# Patient Record
Sex: Male | Born: 1940
Health system: Southern US, Community
[De-identification: ages and names within clinical notes are randomized; demographics above are authoritative.]

## PROBLEM LIST (undated history)

## (undated) DIAGNOSIS — I5032 Chronic diastolic (congestive) heart failure: Secondary | ICD-10-CM

## (undated) DIAGNOSIS — F32A Depression, unspecified: Secondary | ICD-10-CM

## (undated) DIAGNOSIS — E785 Hyperlipidemia, unspecified: Secondary | ICD-10-CM

## (undated) DIAGNOSIS — R001 Bradycardia, unspecified: Secondary | ICD-10-CM

## (undated) DIAGNOSIS — K5790 Diverticulosis of intestine, part unspecified, without perforation or abscess without bleeding: Secondary | ICD-10-CM

## (undated) DIAGNOSIS — Z950 Presence of cardiac pacemaker: Secondary | ICD-10-CM

## (undated) DIAGNOSIS — M1990 Unspecified osteoarthritis, unspecified site: Secondary | ICD-10-CM

## (undated) DIAGNOSIS — D126 Benign neoplasm of colon, unspecified: Secondary | ICD-10-CM

## (undated) DIAGNOSIS — K2970 Gastritis, unspecified, without bleeding: Secondary | ICD-10-CM

## (undated) DIAGNOSIS — I2510 Atherosclerotic heart disease of native coronary artery without angina pectoris: Secondary | ICD-10-CM

## (undated) DIAGNOSIS — I491 Atrial premature depolarization: Secondary | ICD-10-CM

## (undated) DIAGNOSIS — Z789 Other specified health status: Secondary | ICD-10-CM

## (undated) DIAGNOSIS — G4733 Obstructive sleep apnea (adult) (pediatric): Secondary | ICD-10-CM

## (undated) DIAGNOSIS — E039 Hypothyroidism, unspecified: Secondary | ICD-10-CM

## (undated) DIAGNOSIS — I10 Essential (primary) hypertension: Secondary | ICD-10-CM

## (undated) DIAGNOSIS — R7303 Prediabetes: Secondary | ICD-10-CM

## (undated) DIAGNOSIS — I493 Ventricular premature depolarization: Secondary | ICD-10-CM

## (undated) DIAGNOSIS — Z8719 Personal history of other diseases of the digestive system: Secondary | ICD-10-CM

## (undated) DIAGNOSIS — Z9289 Personal history of other medical treatment: Secondary | ICD-10-CM

## (undated) DIAGNOSIS — Q2730 Arteriovenous malformation, site unspecified: Secondary | ICD-10-CM

## (undated) DIAGNOSIS — K219 Gastro-esophageal reflux disease without esophagitis: Secondary | ICD-10-CM

## (undated) DIAGNOSIS — F329 Major depressive disorder, single episode, unspecified: Secondary | ICD-10-CM

## (undated) DIAGNOSIS — I4892 Unspecified atrial flutter: Secondary | ICD-10-CM

## (undated) DIAGNOSIS — I219 Acute myocardial infarction, unspecified: Secondary | ICD-10-CM

## (undated) HISTORY — DX: Atherosclerotic heart disease of native coronary artery without angina pectoris: I25.10

## (undated) HISTORY — DX: Hypothyroidism, unspecified: E03.9

## (undated) HISTORY — DX: Obstructive sleep apnea (adult) (pediatric): G47.33

## (undated) HISTORY — DX: Diverticulosis of intestine, part unspecified, without perforation or abscess without bleeding: K57.90

## (undated) HISTORY — PX: ANKLE SURGERY: SHX546

## (undated) HISTORY — PX: SKIN GRAFT: SHX250

## (undated) HISTORY — DX: Essential (primary) hypertension: I10

## (undated) HISTORY — DX: Benign neoplasm of colon, unspecified: D12.6

## (undated) HISTORY — DX: Hyperlipidemia, unspecified: E78.5

## (undated) HISTORY — DX: Atrial premature depolarization: I49.1

## (undated) HISTORY — DX: Gastritis, unspecified, without bleeding: K29.70

## (undated) HISTORY — PX: TONSILLECTOMY: SUR1361

## (undated) HISTORY — DX: Gastro-esophageal reflux disease without esophagitis: K21.9

## (undated) HISTORY — PX: HERNIA REPAIR: SHX51

## (undated) HISTORY — PX: APPENDECTOMY: SHX54

## (undated) HISTORY — DX: Chronic diastolic (congestive) heart failure: I50.32

## (undated) HISTORY — DX: Acute myocardial infarction, unspecified: I21.9

## (undated) HISTORY — DX: Ventricular premature depolarization: I49.3

## (undated) HISTORY — DX: Arteriovenous malformation, site unspecified: Q27.30

---

## 1998-12-14 ENCOUNTER — Emergency Department (HOSPITAL_COMMUNITY): Admission: EM | Admit: 1998-12-14 | Discharge: 1998-12-14 | Payer: Self-pay | Admitting: Emergency Medicine

## 1999-05-26 ENCOUNTER — Emergency Department (HOSPITAL_COMMUNITY): Admission: EM | Admit: 1999-05-26 | Discharge: 1999-05-26 | Payer: Self-pay

## 1999-05-26 ENCOUNTER — Encounter: Payer: Self-pay | Admitting: Emergency Medicine

## 1999-11-19 ENCOUNTER — Encounter: Payer: Self-pay | Admitting: Emergency Medicine

## 1999-11-19 ENCOUNTER — Emergency Department (HOSPITAL_COMMUNITY): Admission: EM | Admit: 1999-11-19 | Discharge: 1999-11-19 | Payer: Self-pay | Admitting: Emergency Medicine

## 2000-03-12 ENCOUNTER — Ambulatory Visit (HOSPITAL_COMMUNITY): Admission: RE | Admit: 2000-03-12 | Discharge: 2000-03-12 | Payer: Self-pay | Admitting: Family Medicine

## 2000-03-12 ENCOUNTER — Encounter: Payer: Self-pay | Admitting: Family Medicine

## 2000-10-14 ENCOUNTER — Encounter: Payer: Self-pay | Admitting: Family Medicine

## 2000-10-14 ENCOUNTER — Ambulatory Visit (HOSPITAL_COMMUNITY): Admission: RE | Admit: 2000-10-14 | Discharge: 2000-10-14 | Payer: Self-pay | Admitting: Family Medicine

## 2001-04-02 ENCOUNTER — Encounter: Payer: Self-pay | Admitting: Emergency Medicine

## 2001-04-02 ENCOUNTER — Emergency Department (HOSPITAL_COMMUNITY): Admission: EM | Admit: 2001-04-02 | Discharge: 2001-04-02 | Payer: Self-pay | Admitting: Emergency Medicine

## 2002-02-18 ENCOUNTER — Encounter: Payer: Self-pay | Admitting: Family Medicine

## 2002-02-18 ENCOUNTER — Ambulatory Visit (HOSPITAL_COMMUNITY): Admission: RE | Admit: 2002-02-18 | Discharge: 2002-02-18 | Payer: Self-pay | Admitting: Family Medicine

## 2003-11-15 ENCOUNTER — Inpatient Hospital Stay (HOSPITAL_COMMUNITY): Admission: AD | Admit: 2003-11-15 | Discharge: 2003-11-16 | Payer: Self-pay | Admitting: *Deleted

## 2004-09-22 ENCOUNTER — Ambulatory Visit: Payer: Self-pay | Admitting: Family Medicine

## 2005-01-02 ENCOUNTER — Ambulatory Visit: Payer: Self-pay | Admitting: Family Medicine

## 2005-01-02 ENCOUNTER — Encounter: Admission: RE | Admit: 2005-01-02 | Discharge: 2005-01-02 | Payer: Self-pay | Admitting: Family Medicine

## 2005-04-12 ENCOUNTER — Ambulatory Visit: Payer: Self-pay | Admitting: Family Medicine

## 2005-04-14 ENCOUNTER — Encounter: Admission: RE | Admit: 2005-04-14 | Discharge: 2005-04-14 | Payer: Self-pay | Admitting: Family Medicine

## 2005-07-10 ENCOUNTER — Ambulatory Visit: Payer: Self-pay | Admitting: Family Medicine

## 2005-07-10 ENCOUNTER — Ambulatory Visit: Payer: Self-pay | Admitting: Cardiovascular Disease

## 2005-07-11 ENCOUNTER — Ambulatory Visit: Payer: Self-pay | Admitting: Cardiovascular Disease

## 2005-07-11 ENCOUNTER — Ambulatory Visit (HOSPITAL_COMMUNITY): Admission: RE | Admit: 2005-07-11 | Discharge: 2005-07-11 | Payer: Self-pay | Admitting: Cardiovascular Disease

## 2005-07-12 ENCOUNTER — Observation Stay (HOSPITAL_COMMUNITY): Admission: EM | Admit: 2005-07-12 | Discharge: 2005-07-13 | Payer: Self-pay | Admitting: Emergency Medicine

## 2005-07-23 DIAGNOSIS — D126 Benign neoplasm of colon, unspecified: Secondary | ICD-10-CM

## 2005-07-23 HISTORY — DX: Benign neoplasm of colon, unspecified: D12.6

## 2005-07-25 ENCOUNTER — Ambulatory Visit: Payer: Self-pay | Admitting: Cardiology

## 2005-07-26 ENCOUNTER — Ambulatory Visit: Payer: Self-pay | Admitting: Internal Medicine

## 2005-08-01 ENCOUNTER — Ambulatory Visit: Payer: Self-pay | Admitting: *Deleted

## 2005-08-01 ENCOUNTER — Ambulatory Visit: Payer: Self-pay | Admitting: Internal Medicine

## 2005-08-01 ENCOUNTER — Ambulatory Visit: Payer: Self-pay | Admitting: Family Medicine

## 2005-08-01 ENCOUNTER — Encounter: Admission: RE | Admit: 2005-08-01 | Discharge: 2005-08-01 | Payer: Self-pay | Admitting: Family Medicine

## 2005-08-01 ENCOUNTER — Ambulatory Visit: Payer: Self-pay

## 2005-08-02 ENCOUNTER — Encounter: Admission: RE | Admit: 2005-08-02 | Discharge: 2005-08-02 | Payer: Self-pay | Admitting: Family Medicine

## 2005-08-06 ENCOUNTER — Ambulatory Visit: Payer: Self-pay | Admitting: Family Medicine

## 2005-08-09 ENCOUNTER — Ambulatory Visit: Payer: Self-pay | Admitting: Gastroenterology

## 2005-08-23 ENCOUNTER — Encounter (INDEPENDENT_AMBULATORY_CARE_PROVIDER_SITE_OTHER): Payer: Self-pay | Admitting: *Deleted

## 2005-08-23 ENCOUNTER — Ambulatory Visit: Payer: Self-pay | Admitting: Gastroenterology

## 2005-08-23 HISTORY — PX: ESOPHAGOGASTRODUODENOSCOPY: SHX1529

## 2005-08-23 HISTORY — PX: COLONOSCOPY: SHX174

## 2005-08-29 ENCOUNTER — Ambulatory Visit: Payer: Self-pay | Admitting: Gastroenterology

## 2005-08-30 ENCOUNTER — Encounter (INDEPENDENT_AMBULATORY_CARE_PROVIDER_SITE_OTHER): Payer: Self-pay | Admitting: *Deleted

## 2005-08-30 ENCOUNTER — Ambulatory Visit: Payer: Self-pay | Admitting: Gastroenterology

## 2005-09-06 ENCOUNTER — Ambulatory Visit: Payer: Self-pay | Admitting: Gastroenterology

## 2005-09-11 ENCOUNTER — Ambulatory Visit: Payer: Self-pay | Admitting: Family Medicine

## 2005-09-12 ENCOUNTER — Ambulatory Visit: Payer: Self-pay | Admitting: Gastroenterology

## 2005-09-17 ENCOUNTER — Ambulatory Visit (HOSPITAL_COMMUNITY): Admission: RE | Admit: 2005-09-17 | Discharge: 2005-09-17 | Payer: Self-pay | Admitting: Gastroenterology

## 2005-09-17 ENCOUNTER — Ambulatory Visit: Payer: Self-pay | Admitting: Gastroenterology

## 2005-09-18 ENCOUNTER — Ambulatory Visit: Payer: Self-pay | Admitting: Family Medicine

## 2005-09-25 ENCOUNTER — Ambulatory Visit: Payer: Self-pay | Admitting: Family Medicine

## 2005-10-01 ENCOUNTER — Ambulatory Visit: Payer: Self-pay | Admitting: Gastroenterology

## 2005-10-02 ENCOUNTER — Ambulatory Visit: Payer: Self-pay | Admitting: Family Medicine

## 2005-10-08 ENCOUNTER — Ambulatory Visit: Payer: Self-pay | Admitting: Gastroenterology

## 2005-10-09 ENCOUNTER — Ambulatory Visit (HOSPITAL_COMMUNITY): Admission: RE | Admit: 2005-10-09 | Discharge: 2005-10-09 | Payer: Self-pay | Admitting: Gastroenterology

## 2005-10-09 ENCOUNTER — Encounter: Payer: Self-pay | Admitting: Gastroenterology

## 2005-10-09 ENCOUNTER — Ambulatory Visit: Payer: Self-pay | Admitting: Family Medicine

## 2005-10-12 ENCOUNTER — Ambulatory Visit: Payer: Self-pay | Admitting: Gastroenterology

## 2005-10-12 ENCOUNTER — Ambulatory Visit: Payer: Self-pay | Admitting: Family Medicine

## 2005-10-29 ENCOUNTER — Ambulatory Visit: Payer: Self-pay | Admitting: Family Medicine

## 2006-05-02 ENCOUNTER — Ambulatory Visit: Payer: Self-pay | Admitting: Internal Medicine

## 2006-05-06 ENCOUNTER — Ambulatory Visit: Payer: Self-pay | Admitting: Family Medicine

## 2006-05-06 LAB — CONVERTED CEMR LAB
Basophils Relative: 0.7 % (ref 0.0–1.0)
Eosinophil percent: 4.1 % (ref 0.0–5.0)
Hemoglobin: 13.3 g/dL (ref 13.0–17.0)
Monocytes Absolute: 1.1 10*3/uL — ABNORMAL HIGH (ref 0.2–0.7)
Monocytes Relative: 12.4 % — ABNORMAL HIGH (ref 3.0–11.0)
Platelets: 322 10*3/uL (ref 150–400)
RDW: 14.1 % (ref 11.5–14.6)
T3, Free: 3.3 pg/mL (ref 2.3–4.2)
TSH: 3.27 microintl units/mL (ref 0.35–5.50)
WBC: 9.2 10*3/uL (ref 4.5–10.5)

## 2006-07-22 ENCOUNTER — Ambulatory Visit: Payer: Self-pay | Admitting: Family Medicine

## 2006-10-14 ENCOUNTER — Ambulatory Visit: Payer: Self-pay | Admitting: Family Medicine

## 2007-01-13 DIAGNOSIS — D649 Anemia, unspecified: Secondary | ICD-10-CM | POA: Insufficient documentation

## 2007-01-13 DIAGNOSIS — K219 Gastro-esophageal reflux disease without esophagitis: Secondary | ICD-10-CM | POA: Insufficient documentation

## 2007-03-26 ENCOUNTER — Ambulatory Visit: Payer: Self-pay | Admitting: Family Medicine

## 2007-03-26 DIAGNOSIS — K279 Peptic ulcer, site unspecified, unspecified as acute or chronic, without hemorrhage or perforation: Secondary | ICD-10-CM | POA: Insufficient documentation

## 2007-03-28 ENCOUNTER — Encounter: Payer: Self-pay | Admitting: Family Medicine

## 2007-03-28 LAB — CONVERTED CEMR LAB
Alkaline Phosphatase: 52 units/L (ref 39–117)
Basophils Relative: 0.4 % (ref 0.0–1.0)
Bilirubin, Direct: 0.1 mg/dL (ref 0.0–0.3)
CO2: 27 meq/L (ref 19–32)
Creatinine, Ser: 1 mg/dL (ref 0.4–1.5)
Glucose, Bld: 98 mg/dL (ref 70–99)
HCT: 37.2 % — ABNORMAL LOW (ref 39.0–52.0)
Hemoglobin: 12.2 g/dL — ABNORMAL LOW (ref 13.0–17.0)
Lymphocytes Relative: 28.1 % (ref 12.0–46.0)
Monocytes Absolute: 1.5 10*3/uL — ABNORMAL HIGH (ref 0.2–0.7)
Neutrophils Relative %: 52.1 % (ref 43.0–77.0)
Potassium: 3.9 meq/L (ref 3.5–5.1)
RDW: 15.2 % — ABNORMAL HIGH (ref 11.5–14.6)
Sodium: 140 meq/L (ref 135–145)
TSH: 15.83 microintl units/mL — ABNORMAL HIGH (ref 0.35–5.50)
Total Bilirubin: 1.2 mg/dL (ref 0.3–1.2)
Total Protein: 7 g/dL (ref 6.0–8.3)

## 2007-05-27 ENCOUNTER — Ambulatory Visit: Payer: Self-pay | Admitting: Family Medicine

## 2007-05-28 ENCOUNTER — Ambulatory Visit: Payer: Self-pay | Admitting: Family Medicine

## 2007-05-28 LAB — CONVERTED CEMR LAB: TSH: 4.35 microintl units/mL (ref 0.35–5.50)

## 2007-06-25 ENCOUNTER — Telehealth: Payer: Self-pay | Admitting: Family Medicine

## 2007-08-07 ENCOUNTER — Encounter: Payer: Self-pay | Admitting: Family Medicine

## 2007-08-26 ENCOUNTER — Encounter: Payer: Self-pay | Admitting: Family Medicine

## 2007-09-02 ENCOUNTER — Ambulatory Visit (HOSPITAL_BASED_OUTPATIENT_CLINIC_OR_DEPARTMENT_OTHER): Admission: RE | Admit: 2007-09-02 | Discharge: 2007-09-02 | Payer: Self-pay | Admitting: Orthopedic Surgery

## 2007-09-02 ENCOUNTER — Encounter: Payer: Self-pay | Admitting: Family Medicine

## 2007-09-03 ENCOUNTER — Ambulatory Visit: Payer: Self-pay | Admitting: Internal Medicine

## 2007-09-03 ENCOUNTER — Ambulatory Visit: Payer: Self-pay | Admitting: Cardiology

## 2007-09-03 ENCOUNTER — Inpatient Hospital Stay (HOSPITAL_COMMUNITY): Admission: EM | Admit: 2007-09-03 | Discharge: 2007-09-05 | Payer: Self-pay | Admitting: Emergency Medicine

## 2007-09-05 ENCOUNTER — Encounter: Payer: Self-pay | Admitting: Family Medicine

## 2007-09-10 ENCOUNTER — Encounter: Payer: Self-pay | Admitting: Family Medicine

## 2007-09-11 ENCOUNTER — Ambulatory Visit: Payer: Self-pay | Admitting: Family Medicine

## 2007-09-11 DIAGNOSIS — I252 Old myocardial infarction: Secondary | ICD-10-CM | POA: Insufficient documentation

## 2007-09-15 ENCOUNTER — Encounter: Payer: Self-pay | Admitting: Family Medicine

## 2007-09-15 LAB — CONVERTED CEMR LAB
Basophils Relative: 0.7 % (ref 0.0–1.0)
Lymphocytes Relative: 21.8 % (ref 12.0–46.0)
Monocytes Relative: 10.7 % (ref 3.0–11.0)
Neutro Abs: 10.1 10*3/uL — ABNORMAL HIGH (ref 1.4–7.7)
Platelets: 379 10*3/uL (ref 150–400)

## 2007-09-18 ENCOUNTER — Ambulatory Visit: Payer: Self-pay | Admitting: Internal Medicine

## 2007-09-24 ENCOUNTER — Telehealth (INDEPENDENT_AMBULATORY_CARE_PROVIDER_SITE_OTHER): Payer: Self-pay | Admitting: *Deleted

## 2007-09-30 ENCOUNTER — Ambulatory Visit: Payer: Self-pay | Admitting: Cardiology

## 2007-10-04 ENCOUNTER — Ambulatory Visit: Payer: Self-pay | Admitting: Family Medicine

## 2007-10-04 DIAGNOSIS — J209 Acute bronchitis, unspecified: Secondary | ICD-10-CM | POA: Insufficient documentation

## 2007-10-08 ENCOUNTER — Encounter: Payer: Self-pay | Admitting: Family Medicine

## 2007-10-15 ENCOUNTER — Ambulatory Visit: Payer: Self-pay | Admitting: Cardiology

## 2007-11-06 ENCOUNTER — Encounter: Payer: Self-pay | Admitting: Family Medicine

## 2007-12-25 ENCOUNTER — Ambulatory Visit: Payer: Self-pay | Admitting: Cardiology

## 2008-01-19 ENCOUNTER — Telehealth: Payer: Self-pay | Admitting: Family Medicine

## 2008-03-22 ENCOUNTER — Ambulatory Visit: Payer: Self-pay | Admitting: Cardiology

## 2008-03-25 ENCOUNTER — Ambulatory Visit: Payer: Self-pay | Admitting: Cardiology

## 2008-03-25 LAB — CONVERTED CEMR LAB
Basophils Absolute: 0.1 10*3/uL (ref 0.0–0.1)
Bilirubin, Direct: 0.1 mg/dL (ref 0.0–0.3)
Calcium: 9.3 mg/dL (ref 8.4–10.5)
Cholesterol: 133 mg/dL (ref 0–200)
Eosinophils Absolute: 0.4 10*3/uL (ref 0.0–0.7)
GFR calc Af Amer: 86 mL/min
GFR calc non Af Amer: 71 mL/min
HCT: 47.3 % (ref 39.0–52.0)
Hemoglobin: 16.5 g/dL (ref 13.0–17.0)
LDL Cholesterol: 71 mg/dL (ref 0–99)
Lymphocytes Relative: 30.9 % (ref 12.0–46.0)
MCHC: 34.8 g/dL (ref 30.0–36.0)
Monocytes Absolute: 0.9 10*3/uL (ref 0.1–1.0)
Neutro Abs: 4.4 10*3/uL (ref 1.4–7.7)
RDW: 13.3 % (ref 11.5–14.6)
Sodium: 142 meq/L (ref 135–145)
TSH: 2.37 microintl units/mL (ref 0.35–5.50)
Total Bilirubin: 1 mg/dL (ref 0.3–1.2)
Triglycerides: 154 mg/dL — ABNORMAL HIGH (ref 0–149)

## 2008-04-22 ENCOUNTER — Ambulatory Visit: Payer: Self-pay | Admitting: Cardiology

## 2008-05-05 ENCOUNTER — Telehealth: Payer: Self-pay | Admitting: Family Medicine

## 2008-06-22 ENCOUNTER — Ambulatory Visit: Payer: Self-pay | Admitting: Family Medicine

## 2008-06-22 DIAGNOSIS — J019 Acute sinusitis, unspecified: Secondary | ICD-10-CM | POA: Insufficient documentation

## 2008-07-27 ENCOUNTER — Telehealth: Payer: Self-pay | Admitting: Family Medicine

## 2008-08-26 ENCOUNTER — Ambulatory Visit: Payer: Self-pay | Admitting: Cardiology

## 2008-09-14 ENCOUNTER — Ambulatory Visit: Payer: Self-pay | Admitting: Family Medicine

## 2008-09-14 ENCOUNTER — Encounter (INDEPENDENT_AMBULATORY_CARE_PROVIDER_SITE_OTHER): Payer: Self-pay | Admitting: *Deleted

## 2008-09-27 ENCOUNTER — Telehealth: Payer: Self-pay | Admitting: Family Medicine

## 2008-10-14 DIAGNOSIS — I25119 Atherosclerotic heart disease of native coronary artery with unspecified angina pectoris: Secondary | ICD-10-CM | POA: Insufficient documentation

## 2008-10-14 DIAGNOSIS — I2510 Atherosclerotic heart disease of native coronary artery without angina pectoris: Secondary | ICD-10-CM | POA: Insufficient documentation

## 2008-10-15 ENCOUNTER — Encounter: Payer: Self-pay | Admitting: Cardiology

## 2008-10-15 ENCOUNTER — Ambulatory Visit: Payer: Self-pay | Admitting: Cardiology

## 2008-10-15 DIAGNOSIS — R5381 Other malaise: Secondary | ICD-10-CM | POA: Insufficient documentation

## 2008-10-15 DIAGNOSIS — R5383 Other fatigue: Secondary | ICD-10-CM | POA: Insufficient documentation

## 2008-10-18 ENCOUNTER — Telehealth (INDEPENDENT_AMBULATORY_CARE_PROVIDER_SITE_OTHER): Payer: Self-pay

## 2008-10-20 ENCOUNTER — Encounter: Payer: Self-pay | Admitting: Cardiology

## 2008-10-20 ENCOUNTER — Ambulatory Visit: Payer: Self-pay

## 2008-10-20 ENCOUNTER — Ambulatory Visit: Payer: Self-pay | Admitting: Cardiology

## 2008-10-20 LAB — CONVERTED CEMR LAB
AST: 29 units/L (ref 0–37)
Alkaline Phosphatase: 52 units/L (ref 39–117)
BUN: 16 mg/dL (ref 6–23)
Basophils Absolute: 0 10*3/uL (ref 0.0–0.1)
Calcium: 9.2 mg/dL (ref 8.4–10.5)
GFR calc non Af Amer: 79.22 mL/min (ref >60)
Hemoglobin: 15.6 g/dL (ref 13.0–17.0)
LDL Cholesterol: 81 mg/dL (ref 0–99)
Lymphocytes Relative: 28.5 % (ref 12.0–46.0)
Monocytes Relative: 12.1 % — ABNORMAL HIGH (ref 3.0–12.0)
Neutro Abs: 5 10*3/uL (ref 1.4–7.7)
Neutrophils Relative %: 54.8 % (ref 43.0–77.0)
Potassium: 4.2 meq/L (ref 3.5–5.1)
RDW: 12.8 % (ref 11.5–14.6)
Sodium: 141 meq/L (ref 135–145)
TSH: 5.9 microintl units/mL — ABNORMAL HIGH (ref 0.35–5.50)
Total Bilirubin: 0.8 mg/dL (ref 0.3–1.2)
VLDL: 24.2 mg/dL (ref 0.0–40.0)

## 2008-10-27 ENCOUNTER — Encounter: Payer: Self-pay | Admitting: Pulmonary Disease

## 2008-10-27 ENCOUNTER — Ambulatory Visit (HOSPITAL_BASED_OUTPATIENT_CLINIC_OR_DEPARTMENT_OTHER): Admission: RE | Admit: 2008-10-27 | Discharge: 2008-10-27 | Payer: Self-pay | Admitting: Cardiology

## 2008-10-28 ENCOUNTER — Telehealth: Payer: Self-pay | Admitting: Family Medicine

## 2008-11-09 ENCOUNTER — Ambulatory Visit: Payer: Self-pay | Admitting: Pulmonary Disease

## 2008-12-13 ENCOUNTER — Ambulatory Visit: Payer: Self-pay | Admitting: Pulmonary Disease

## 2009-01-04 ENCOUNTER — Ambulatory Visit: Payer: Self-pay | Admitting: Family Medicine

## 2009-01-04 DIAGNOSIS — R51 Headache: Secondary | ICD-10-CM | POA: Insufficient documentation

## 2009-01-04 DIAGNOSIS — R519 Headache, unspecified: Secondary | ICD-10-CM | POA: Insufficient documentation

## 2009-02-07 ENCOUNTER — Ambulatory Visit: Payer: Self-pay | Admitting: Family Medicine

## 2009-02-24 ENCOUNTER — Ambulatory Visit: Payer: Self-pay | Admitting: Cardiology

## 2009-03-01 ENCOUNTER — Telehealth: Payer: Self-pay | Admitting: Family Medicine

## 2009-03-23 ENCOUNTER — Ambulatory Visit: Payer: Self-pay | Admitting: Family Medicine

## 2009-04-21 ENCOUNTER — Telehealth: Payer: Self-pay | Admitting: Family Medicine

## 2009-06-07 ENCOUNTER — Ambulatory Visit: Payer: Self-pay | Admitting: Family Medicine

## 2009-06-24 ENCOUNTER — Encounter (INDEPENDENT_AMBULATORY_CARE_PROVIDER_SITE_OTHER): Payer: Self-pay | Admitting: *Deleted

## 2009-07-20 ENCOUNTER — Telehealth: Payer: Self-pay | Admitting: Internal Medicine

## 2009-07-23 DIAGNOSIS — Q2730 Arteriovenous malformation, site unspecified: Secondary | ICD-10-CM

## 2009-07-23 DIAGNOSIS — K2970 Gastritis, unspecified, without bleeding: Secondary | ICD-10-CM

## 2009-07-23 HISTORY — DX: Gastritis, unspecified, without bleeding: K29.70

## 2009-07-23 HISTORY — DX: Arteriovenous malformation, site unspecified: Q27.30

## 2009-08-16 ENCOUNTER — Telehealth: Payer: Self-pay | Admitting: Family Medicine

## 2009-08-19 ENCOUNTER — Telehealth: Payer: Self-pay | Admitting: Family Medicine

## 2009-08-29 ENCOUNTER — Inpatient Hospital Stay (HOSPITAL_COMMUNITY): Admission: AD | Admit: 2009-08-29 | Discharge: 2009-08-31 | Payer: Self-pay | Admitting: Cardiology

## 2009-08-29 ENCOUNTER — Ambulatory Visit: Payer: Self-pay | Admitting: Cardiology

## 2009-09-06 ENCOUNTER — Telehealth (INDEPENDENT_AMBULATORY_CARE_PROVIDER_SITE_OTHER): Payer: Self-pay | Admitting: *Deleted

## 2009-09-07 ENCOUNTER — Encounter (HOSPITAL_COMMUNITY): Admission: RE | Admit: 2009-09-07 | Discharge: 2009-11-22 | Payer: Self-pay | Admitting: Cardiology

## 2009-09-07 ENCOUNTER — Ambulatory Visit: Payer: Self-pay | Admitting: Cardiology

## 2009-09-07 ENCOUNTER — Ambulatory Visit: Payer: Self-pay

## 2009-09-15 ENCOUNTER — Ambulatory Visit: Payer: Self-pay | Admitting: Cardiology

## 2009-10-18 ENCOUNTER — Encounter (INDEPENDENT_AMBULATORY_CARE_PROVIDER_SITE_OTHER): Payer: Self-pay | Admitting: *Deleted

## 2009-10-19 ENCOUNTER — Telehealth: Payer: Self-pay | Admitting: Family Medicine

## 2009-12-01 ENCOUNTER — Telehealth: Payer: Self-pay | Admitting: Family Medicine

## 2009-12-02 ENCOUNTER — Ambulatory Visit: Payer: Self-pay | Admitting: Family Medicine

## 2010-02-16 ENCOUNTER — Telehealth (INDEPENDENT_AMBULATORY_CARE_PROVIDER_SITE_OTHER): Payer: Self-pay

## 2010-02-21 ENCOUNTER — Telehealth: Payer: Self-pay | Admitting: Family Medicine

## 2010-03-17 ENCOUNTER — Telehealth: Payer: Self-pay | Admitting: Family Medicine

## 2010-03-24 ENCOUNTER — Telehealth: Payer: Self-pay | Admitting: Family Medicine

## 2010-04-03 ENCOUNTER — Telehealth: Payer: Self-pay | Admitting: Family Medicine

## 2010-04-25 ENCOUNTER — Ambulatory Visit: Payer: Self-pay | Admitting: Family Medicine

## 2010-04-25 ENCOUNTER — Telehealth: Payer: Self-pay | Admitting: Family Medicine

## 2010-04-25 DIAGNOSIS — R7309 Other abnormal glucose: Secondary | ICD-10-CM | POA: Insufficient documentation

## 2010-04-27 ENCOUNTER — Telehealth: Payer: Self-pay | Admitting: Gastroenterology

## 2010-04-27 ENCOUNTER — Ambulatory Visit: Payer: Self-pay | Admitting: Internal Medicine

## 2010-04-27 ENCOUNTER — Ambulatory Visit: Payer: Self-pay | Admitting: Gastroenterology

## 2010-04-27 ENCOUNTER — Inpatient Hospital Stay (HOSPITAL_COMMUNITY): Admission: AD | Admit: 2010-04-27 | Discharge: 2010-04-30 | Payer: Self-pay | Admitting: Internal Medicine

## 2010-04-27 DIAGNOSIS — D500 Iron deficiency anemia secondary to blood loss (chronic): Secondary | ICD-10-CM | POA: Insufficient documentation

## 2010-04-27 DIAGNOSIS — K5730 Diverticulosis of large intestine without perforation or abscess without bleeding: Secondary | ICD-10-CM | POA: Insufficient documentation

## 2010-04-27 DIAGNOSIS — Z860100 Personal history of colon polyps, unspecified: Secondary | ICD-10-CM | POA: Insufficient documentation

## 2010-04-27 DIAGNOSIS — K5521 Angiodysplasia of colon with hemorrhage: Secondary | ICD-10-CM | POA: Insufficient documentation

## 2010-04-27 LAB — CONVERTED CEMR LAB
ALT: 20 units/L (ref 0–53)
Albumin: 4.1 g/dL (ref 3.5–5.2)
Basophils Relative: 0.9 % (ref 0.0–3.0)
CO2: 26 meq/L (ref 19–32)
Chloride: 106 meq/L (ref 96–112)
Creatinine, Ser: 1.1 mg/dL (ref 0.4–1.5)
Eosinophils Absolute: 0.5 10*3/uL (ref 0.0–0.7)
Eosinophils Relative: 4.4 % (ref 0.0–5.0)
HCT: 26.2 % — ABNORMAL LOW (ref 39.0–52.0)
Hemoglobin: 7.9 g/dL — CL (ref 13.0–17.0)
Hgb A1c MFr Bld: 6.6 % — ABNORMAL HIGH (ref 4.6–6.5)
Lymphs Abs: 2.7 10*3/uL (ref 0.7–4.0)
MCHC: 30.3 g/dL (ref 30.0–36.0)
MCV: 64.6 fL — ABNORMAL LOW (ref 78.0–100.0)
Monocytes Absolute: 1 10*3/uL (ref 0.1–1.0)
Neutro Abs: 6.4 10*3/uL (ref 1.4–7.7)
Potassium: 4.4 meq/L (ref 3.5–5.1)
RBC: 4.06 M/uL — ABNORMAL LOW (ref 4.22–5.81)
Sodium: 139 meq/L (ref 135–145)
Total CK: 150 units/L (ref 7–232)
Total Protein: 6.8 g/dL (ref 6.0–8.3)
WBC: 10.6 10*3/uL — ABNORMAL HIGH (ref 4.5–10.5)

## 2010-04-28 ENCOUNTER — Encounter: Payer: Self-pay | Admitting: Gastroenterology

## 2010-04-28 ENCOUNTER — Encounter: Payer: Self-pay | Admitting: Cardiology

## 2010-04-28 ENCOUNTER — Ambulatory Visit: Payer: Self-pay | Admitting: Surgery

## 2010-04-28 ENCOUNTER — Encounter (INDEPENDENT_AMBULATORY_CARE_PROVIDER_SITE_OTHER): Payer: Self-pay | Admitting: Internal Medicine

## 2010-05-01 ENCOUNTER — Encounter (INDEPENDENT_AMBULATORY_CARE_PROVIDER_SITE_OTHER): Payer: Self-pay | Admitting: *Deleted

## 2010-05-01 ENCOUNTER — Encounter: Payer: Self-pay | Admitting: Gastroenterology

## 2010-05-04 ENCOUNTER — Telehealth: Payer: Self-pay | Admitting: Family Medicine

## 2010-05-19 ENCOUNTER — Telehealth: Payer: Self-pay | Admitting: Gastroenterology

## 2010-05-19 ENCOUNTER — Ambulatory Visit: Payer: Self-pay | Admitting: Cardiology

## 2010-05-19 ENCOUNTER — Encounter: Payer: Self-pay | Admitting: Cardiology

## 2010-05-30 ENCOUNTER — Encounter (INDEPENDENT_AMBULATORY_CARE_PROVIDER_SITE_OTHER): Payer: Self-pay | Admitting: *Deleted

## 2010-05-30 LAB — CONVERTED CEMR LAB
Basophils Absolute: 0.1 10*3/uL (ref 0.0–0.1)
Eosinophils Absolute: 0.7 10*3/uL (ref 0.0–0.7)
Hemoglobin: 12.1 g/dL — ABNORMAL LOW (ref 13.0–17.0)
Lymphocytes Relative: 26.9 % (ref 12.0–46.0)
Lymphs Abs: 2.7 10*3/uL (ref 0.7–4.0)
MCHC: 31.6 g/dL (ref 30.0–36.0)
Monocytes Relative: 11.9 % (ref 3.0–12.0)
Neutro Abs: 5.5 10*3/uL (ref 1.4–7.7)
Platelets: 469 10*3/uL — ABNORMAL HIGH (ref 150.0–400.0)
RDW: 30.8 % — ABNORMAL HIGH (ref 11.5–14.6)

## 2010-05-31 ENCOUNTER — Ambulatory Visit: Payer: Self-pay | Admitting: Gastroenterology

## 2010-05-31 DIAGNOSIS — K5521 Angiodysplasia of colon with hemorrhage: Secondary | ICD-10-CM | POA: Insufficient documentation

## 2010-06-01 ENCOUNTER — Ambulatory Visit: Payer: Self-pay | Admitting: Family Medicine

## 2010-06-06 ENCOUNTER — Ambulatory Visit: Payer: Self-pay | Admitting: Gastroenterology

## 2010-06-08 ENCOUNTER — Telehealth: Payer: Self-pay | Admitting: Family Medicine

## 2010-06-11 LAB — CONVERTED CEMR LAB: Fecal Occult Bld: POSITIVE

## 2010-06-13 ENCOUNTER — Telehealth: Payer: Self-pay | Admitting: Gastroenterology

## 2010-06-30 ENCOUNTER — Ambulatory Visit: Payer: Self-pay | Admitting: Cardiology

## 2010-08-14 ENCOUNTER — Ambulatory Visit
Admission: RE | Admit: 2010-08-14 | Discharge: 2010-08-14 | Payer: Self-pay | Source: Home / Self Care | Attending: Family Medicine | Admitting: Family Medicine

## 2010-08-22 NOTE — Progress Notes (Signed)
Summary: refill clonazepam  Phone Note Refill Request Message from:  Pharmacy on October 19, 2009 2:34 PM  Refills Requested: Medication #1:  CLONAZEPAM 0.5 MG TABS three times a day   Dosage confirmed as above?Dosage Confirmed   Supply Requested: 3 months request from [facility] pharmacy in [place], [place] fax [phone]   Method Requested: Fax to Local Pharmacy Initial call taken by: Raechel Ache, RN,  October 19, 2009 2:36 PM Caller: [facility]  Follow-up for Phone Call        done Follow-up by: [name] MD,  October 19, 2009 4:10 PM  Additional Follow-up for Phone Call Additional follow up Details #1::        Rx faxed to pharmacy Additional Follow-up by: Raechel Ache, RN,  October 19, 2009 4:28 PM    New/Updated Medications: CLONAZEPAM 0.5 MG TABS (CLONAZEPAM) three times a day Prescriptions: CLONAZEPAM 0.5 MG TABS (CLONAZEPAM) three times a day  #270 x 1   Entered and Authorized by:   [name] MD   Signed by:   [name] MD on 10/19/2009   Method used:   Print then Give to Patient   RxID:   [id number]

## 2010-08-22 NOTE — Letter (Signed)
Summary: Patient Notice-Endo Biopsy Results  [facility]  [address] [place], [place] [address]   Phone: [phone]  Fax: [phone]        May 01, 2010 MRN: [id number]    [name] [address] [place], [place]  [address]    Dear Mr. WEISSINGER,  I am pleased to inform you that the biopsies taken during your recent endoscopic examination did not show any evidence of cancer upon pathologic examination. The biopsies showed gastritis. Continue the medication prescribed at discharge from the hospital.  Please call us if you are having persistent problems or have questions about your condition that have not been fully answered at this time.  Sincerely,  Meryl Dare MD [facility]  This letter has been electronically signed by your physician.  Appended Document: Patient Notice-Endo Biopsy Results Letter mailed.

## 2010-08-22 NOTE — Assessment & Plan Note (Signed)
Summary: eph  Medications Added AMLODIPINE BESYLATE 5 MG TABS (AMLODIPINE BESYLATE) Take one tablet by mouth daily      Allergies Added:   Visit Type:  Follow-up Referring Provider:  Charlies Constable Primary Provider:  [name] MD  CC:  pt has occ chest pain.  History of Present Illness: The patient is 70 years old and returns for a followup visit after his recent hospitalization for chest pain. He is a retired Estate agent. He was hospitalized in 2009 with a non-ST elevation MI and had nonobstructive disease at catheterization. He was enrolled in the pacer trial. He done well but in February he developed recurrent chest pain and was admitted to the hospital. He ruled out for an MI and was discharged home for followup Myoview scan. While he was in the hospital he had a 7 second pause while on beta blocker. He is a history of obstructive sleep apnea but has not been treated with BiPAP. After he went home he had a negative Myoview scan and he has been off of his beta blocker.  He did have an episode of chest pain within the last couple of days which lasted about 30 minutes and was finally relieved with antiacids.  His other major problems include hypertension, hyperlipidemia, and obstructive sleep apnea. He was seen by Dr. Stann Mainland in July of 2010. Dr. Dorna Mai recommended weight loss and considered CPAP CPAP was never initiated.  Current Medications (verified): 1)  Ketoconazole 2 % Crea (Ketoconazole) .... Three Times A Day As Needed 2)  Vicodin 5-500 Mg Tabs (Hydrocodone-Acetaminophen) .... 4 Times A Day As Needed Pain 3)  Omeprazole 20 Mg Cpdr (Omeprazole) .... One By Mouth Daily 4)  Clonazepam 0.5 Mg Tabs (Clonazepam) .... Three Times A Day 5)  Synthroid 100 Mcg Tabs (Levothyroxine Sodium) .[name].. 1 By Mouth Once Daily 6)  Aspirin 81 Mg  Tbec (Aspirin) .... One By Mouth Every Day 7)  Vitamin C 1000 Mg  Tabs (Ascorbic Acid) .[name].. 1 By Mouth Once Daily 8)  Simvastatin 20 Mg Tabs  (Simvastatin) .... Take One Tablet By Mouth Daily At Bedtime 9)  Fish Oil   Oil (Fish Oil) .... Once Daily 10)  Potassium Chloride Cr 10 Meq  Tbcr (Potassium Chloride) .... Once Daily 11)  Tracer Study Drug .... As Directed 12)  Flexeril 10 Mg Tabs (Cyclobenzaprine Hcl) .... Three Times A Day As Needed Spasm 13)  Voltaren 1 % Gel (Diclofenac Sodium) .... Apply 4g To Lower Extremeties 4 Times Daily.  No More Than 16g On Any 1 Afftected Joint 14)  Mirapex 1.5 Mg Tabs (Pramipexole Dihydrochloride) .[name].. 1 or 2 At Bedtime 15)  Percocet 10-650 Mg Tabs (Oxycodone-Acetaminophen) .[name].. 1 Q 6 Hours As Needed Pain 16)  Atuss Ds 30-4-30 Mg/59ml Susp (Pseudoephed Hcl-Cpm-Dm Hbr Tan) .... 2 Tsp Q 4 Hours  Allergies (verified): 1)  ! * Zolpidem 2)  ! * Trazodone 3)  ! * Shellfish 4)  Lopressor 5)  Lipitor  Past History:  Past Medical History: Reviewed history from 03/19/2009 and no changes required. chronic left foot pain, sees Dr. Lestine Box and Dr. Ethelene Hal  4. Previous tobacco use, now discontinued. 5. Chronic microcytic anemia thought to be related to chronic GI blood     loss from AV malformations.  RESTLESS LEG SYNDROME, HX OF (ICD-V12.49) HYPOTHYROIDISM (ICD-244.9) HEADACHE (ICD-784.0) OBSTRUCTIVE SLEEP APNEA (ICD-327.23) FATIGUE (ICD-780.79) HYPERLIPIDEMIA-MIXED (ICD-272.4) HYPERTENSION, BENIGN (ICD-401.1) CAD, NATIVE VESSEL (ICD-414.01) ACUTE SINUSITIS, UNSPECIFIED (ICD-461.9) BRONCHITIS, ACUTE (ICD-466.0) MYOCARDIAL INFARCTION, HX OF (ICD-412)  CORONARY ARTERY DISEASE (ICD-414.00) LOW BACK PAIN (ICD-724.2) PEPTIC ULCER DISEASE (ICD-533.90) ANEMIA-NOS (ICD-285.9) GERD   Review of Systems       ROS is negative except as outlined in HPI.   Vital Signs:  Patient profile:   70 year old male Height:      70 inches Weight:      225 pounds BMI:     32.40 Pulse rate:   76 / minute BP sitting:   143 / 86  (left arm) Cuff size:   large  Vitals Entered By: Burnett Kanaris, CNA (September 15, 2009 3:44 PM)  Physical Exam  Additional Exam:  Gen. Well-nourished, in no distress   Neck: No JVD, thyroid not enlarged, no carotid bruits Lungs: No tachypnea, clear without rales, rhonchi or wheezes Cardiovascular: Rhythm regular, PMI not displaced,  heart sounds  normal, no murmurs or gallops, no peripheral edema, pulses normal in all 4 extremities. Abdomen: BS normal, abdomen soft and non-tender without masses or organomegaly, no hepatosplenomegaly. MS: No deformities, no cyanosis or clubbing   Neuro:  No focal sns   Skin:  no lesions    Impression & Recommendations:  Problem # 1:  CAD, NATIVE VESSEL (ICD-414.01) He had a non-ST elevation MI in 2009 with nonobstructive CAD at catheterization at that time. His duration admission for chest pain but had a negative Myoview scan as an outpatient. He is having some recurrent chest pain but I'm not certain this is anginal. His blood pressure is borderline elevated and we'll plan to start him on amlodipine 5 mg daily both for blood pressure and possible microvascular angina.  He had been in the tracer study previously but this study has not been terminated and he is off tracer study drug.  The following medications were removed from the medication list:    Metoprolol Tartrate 25 Mg Tabs (Metoprolol tartrate) .[name]... 1 by mouth two times a day His updated medication list for this problem includes:    Aspirin 81 Mg Tbec (Aspirin) ..... One by mouth every day    Amlodipine Besylate 5 Mg Tabs (Amlodipine besylate) .[name]... Take one tablet by mouth daily  The following medications were removed from the medication list:    Metoprolol Tartrate 25 Mg Tabs (Metoprolol tartrate) .[name]... 1 by mouth two times a day His updated medication list for this problem includes:    Aspirin 81 Mg Tbec (Aspirin) ..... One by mouth every day    Amlodipine Besylate 5 Mg Tabs (Amlodipine besylate) .[name]... Take one tablet by mouth daily  Problem # 2:   HYPERLIPIDEMIA-MIXED (ICD-272.4) He has had hyperlipidemia but was not at target with his recent laboratory studies in the hospital. His HDL was 37 and his LDL was 108. He is on simvastatin. We will encourage him to lose weight and work on his diet more. His updated medication list for this problem includes:    Simvastatin 20 Mg Tabs (Simvastatin) .[name]... Take one tablet by mouth daily at bedtime  Problem # 3:  HYPERTENSION, BENIGN (ICD-401.1) His blood pressure is elevated slightly today. We will add Norvasc 5 mg to his current medications. The following medications were removed from the medication list:    Metoprolol Tartrate 25 Mg Tabs (Metoprolol tartrate) .[name]... 1 by mouth two times a day His updated medication list for this problem includes:    Aspirin 81 Mg Tbec (Aspirin) ..... One by mouth every day    Amlodipine Besylate 5 Mg Tabs (Amlodipine besylate) .[name]... Take one tablet by  mouth daily  Problem # 4:  OBSTRUCTIVE SLEEP APNEA (ICD-327.23) He has obstructive sleep apnea but Dr. Stann Mainland describe this as mild. He said that his wife said he has had no breathing abnormalities and no major snoring since he has been home from the hospital and off beta blockers. I have some concern about this because of the long pause he had in the hospital we will continue the previous recommendation of weight loss for his sleep apnea. He is disinclined to use a CPAP machine.  Other Orders: EKG w/ Interpretation (93000)  Patient Instructions: 1)  Your physician has recommended you make the following change in your medication: 1) Start Norvasc (amlodipine) 5mg  once daily, 2) Decrease aspirin to 81mg  once daily  2)  Your physician wants you to follow-up in: 6 months  You will receive a reminder letter in the mail two months in advance. If you don't receive a letter, please call our office to schedule the follow-up appointment. Prescriptions: AMLODIPINE BESYLATE 5 MG TABS (AMLODIPINE BESYLATE) Take one tablet by  mouth daily  #30 x 6   Entered by:   Sherri Rad, RN, BSN   Authorized by:   Lenoria Farrier, MD, [facility]   Signed by:   Sherri Rad, RN, BSN on 09/15/2009   Method used:   [facility]  [place] [address]       [address]       [place], [place]  [address]       Ph: [phone] or [phone]       Fax: [phone]   RxID:   [id number]

## 2010-08-22 NOTE — Discharge Summary (Signed)
[name], [name]              ACCOUNT NO.:  [id number]      MEDICAL RECORD NO.:  [id number]          PATIENT TYPE:  INP      LOCATION:  1432                         FACILITY:  [facility]      PHYSICIAN:  Hind I Elsaid, MD      DATE OF BIRTH:  04-Apr-1941      DATE OF ADMISSION:  04/27/2010   DATE OF DISCHARGE:  04/30/2010                                  DISCHARGE SUMMARY         PRIMARY CARE PHYSICIAN:  [place].      GASTROENTEROLOGIST:  Venita Lick. Russella Dar, MD, [facility]      DISCHARGE DIAGNOSES:   1. Iron deficiency anemia with heme-positive stool.   2. Jejunal arteriovenous malformation, status post argon-plasma       coagulation and ablation.   3. Mild gastritis in the antrum with erythema and erosion, status post       biopsy.   4. Chest pain, resolved after the endoscopy with no elevation of       troponin, mild elevation of CK, nonobstructive coronary artery       disease in 2009.   5. History of non-ST myocardial infarction in February 2009 with       nonobstructive coronary artery disease.   6. Hyperlipidemia.   7. Obstructive sleep apnea with CPAP noncompliance.   8. Nocturnal bradycardia with pauses.   9. History of paroxysmal atrial fibrillation.   10.History of gastroesophageal reflux disease.   11.History of chronic back pain.   12.History of restless legs syndrome.   13.History of hypothyroidism.   14.History of headache.   15.History of peptic ulcer disease.   16.History of diverticulosis.   17.History of adenomatous colon polyps in 2007.      DISCHARGE MEDICATIONS:   1. Protonix 40 mg p.o. daily.   2. Aspirin 81 mg 2 tablets p.o. daily.   3. Levothyroxine 137 mcg p.o. daily.   4. Clonazepam 0.5 mg p.o. daily at bedtime.   5. Vitamin B12 1 tablet p.o. daily.   6. Fish oil 1000 mg p.o. daily.   7. Vicodin 1 tablet p.o. q.4 h. as needed.   8. Multivitamin.   9. Norvasc 5 mg p.o. daily.   10.Flexeril 10 mg p.o. 3 times daily p.r.n.   11.Ferrous sulfate 325 mg  p.o. b.i.d.   12.Senna/Dulcolax.   13.Zocor 20 mg p.o. daily.   14.Vitamin C 500 mg p.o. daily.      CONSULTATIONS:  Gastroenterology was primary service and Cardiology from   [facility] consulted.      HISTORY OF PRESENT ILLNESS:  This is a 70 year old gentleman with known   history of diabetes and coronary artery disease, presented to [facility] on April 27, 2010 for increased weakness.  He   was found to have worsening anemia, which was felt due to subacute   gastrointestinal blood loss.  So, he was admitted for workup and   observation by gastroenterology service.  Last night, the patient was   transfused with a total of 3 units of  packed RBCs and he was taken to   the endoscopy suite.  After endoscopy, the patient developed substernal   chest pain with radiation to his jaw and left arm, and with a known   history of non-ST MI.  He was seen by Cardiology, Dr. Dietrich Pates who   suggested the patient to send to the hospital through the weekend for   possible cardiac catheterization on Monday, October 11th.  The patient   admitted to the hospital.  The patient has a history of proximal jejunal   AVM on enteroscopy in 2007, status post ablation, also a small gastric   ulcer on EGD in 2007.  He had colonoscopy, which showed diverticulosis   and adenomatous polyps.  The patient admitted to the hospital.   1. Chest pain.  The patient admitted to telemetry floor and Cardiology       consulted.  The patient has never had elevated troponin, only mild       elevation of his CK-MB.  He has a history of cardiac cath in 2009,       which did show a nonobstructive coronary artery disease.       Cardiology recommended outpatient Myoview and continue with       aspirin, and beta-blocker was not recommended as the patient has a       history of severe bradycardia.  Dr. Graciela Husbands did see the patient and       recommended followup in his office.  He will need to follow up with        Dr. Dietrich Pates within 3-4 weeks.  I recommended aspirin 162 mg p.o.       daily.   2. Chronic iron deficiency anemia with history of AVM and peptic ulcer       disease.  The endoscopy did show 3-mm AVM in the proximal jejunum,       mild gastritis in the antrum, and recommended to avoid any NSAID       and continue PPI, and iron replacement.  Then, he needed to follow       up with Dr. Clent Ridges within 4 weeks.  Also, Gastroenterology recommended       to continue aspirin and he needed to follow up with Dr. Arlyce Dice.   3. Hypothyroidism.  The patient's TSH was high at 14.64 and mild       increase of his Synthroid was done.  He need to check that with his       primary care physician.   4. Today, the patient has some bradycardia, which mainly during sleep       and he has a history of sleep apnea, but he was noncompliant with       CPAP.  The patient was asymptomatic and accordingly use beta-       blocker as an outpatient.  The patient may need sleep studies and       CPAP, but the patient declined any CPAP at the present time.       Currently, it was felt that the patient is medically stable to be       discharged.  He need to follow up with Dr. Clent Ridges, his primary care       physician and also he need to follow up with Dr. Arlyce Dice and with       cardiologist from [facility] within the next 2-3 weeks.  The patient       currently is  pain-free.   Hind Bosie Helper, MD               HIE/MEDQ  D:  04/30/2010  T:  05/01/2010  Job:  [id number]      Electronically Signed by Ebony Cargo MD on 05/22/2010 12:09:25 PM

## 2010-08-22 NOTE — Progress Notes (Signed)
Summary: rx clonazepam   Phone Note From Pharmacy   Caller: [facility]   fax [phone] Summary of Call: rx clonazepam  0.5   # 270  Initial call taken by: Pura Spice, RN,  June 08, 2010 12:27 PM  Follow-up for Phone Call        call in #270 with one rf Follow-up by: [name] MD,  June 09, 2010 8:36 AM  Additional Follow-up for Phone Call Additional follow up Details #1::        FAXED TO [facility]  at  [phone] Additional Follow-up by: Pura Spice, RN,  June 09, 2010 9:33 AM

## 2010-08-22 NOTE — Progress Notes (Signed)
Summary: Triage   Phone Note From Other Clinic   Caller: [facility] @ Cardiology  [phone] Call For: Dr. Arlyce Dice Summary of Call: Requesting pt. be seen in 2-3 weeks for hosp. f/u... G.I. bleed Initial call taken by: Karna Christmas,  May 19, 2010 11:12 AM  Follow-up for Phone Call        Patient  is scheduled with Dr Arlyce Dice for 05/31/10 9:30 Follow-up by: Darcey Nora RN, CGRN,  May 19, 2010 11:25 AM

## 2010-08-22 NOTE — Assessment & Plan Note (Signed)
Summary: Cardiology Nuclear Study  Nuclear Med Background Indications for Stress Test: Evaluation for Ischemia, Post Hospital  Indications Comments: 08/29/09 CP/SOB, (-)enzymes, 6.9 second sinus pauses in hospital  History: Echo, GXT, Heart Catheterization, Myocardial Infarction, Myocardial Perfusion Study  History Comments: '09 perioperative NSTEMI>N/O CAD, EF=55%;'10 no ischemia, EF=66%; 2/11 Echo:EF=50-55%  Symptoms: Chest Pressure, DOE, Fatigue, Nausea, Palpitations, SOB  Symptoms Comments: Last episode of ZO:XWRU since discharge.   Nuclear Pre-Procedure Cardiac Risk Factors: Family History - CAD, History of Smoking, Hypertension, Lipids, Obesity Caffeine/Decaff Intake: None NPO After: 8:00 PM Lungs: Clear IV 0.9% NS with Angio Cath: 18g     IV Site: (R) AC IV Started by: Stanton Kidney EMT-P Chest Size (in) 44     Height (in): 70 Weight (lb): 222 BMI: 31.97  Nuclear Med Study 1 or 2 day study:  1 day     Stress Test Type:  Eugenie Birks Reading MD:  Marca Ancona, MD     Referring MD:  Charlies Constable, MD Resting Radionuclide:  Technetium 31m Tetrofosmin     Resting Radionuclide Dose:  11.0 mCi  Stress Radionuclide:  Technetium 73m Tetrofosmin     Stress Radionuclide Dose:  32.0 mCi   Stress Protocol   Lexiscan: 0.4 mg   Stress Test Technologist:  [employer] CMA-N     Nuclear Technologist:  Burna Mortimer Deal RT-N  Rest Procedure  Myocardial perfusion imaging was performed at rest 45 minutes following the intravenous administration of Myoview Technetium 57m Tetrofosmin.  Stress Procedure  The patient initially walked the treadmill utilizing the Bruce protocol for 4:32, but was unable to get his heart rate up.  He then received IV Lexiscan 0.4 mg over 15-seconds.  Myoview injected at 30-seconds.  There were no significant changes with lexiscan, only occasional PVC's with couplets.  Quantitative spect images were obtained after a 45 minute delay.  QPS Raw Data Images:  Normal; no  motion artifact; normal heart/lung ratio. Stress Images:  NI: Uniform and normal uptake of tracer in all myocardial segments. Rest Images:  Normal homogeneous uptake in all areas of the myocardium. Subtraction (SDS):  There is no evidence of scar or ischemia. Transient Ischemic Dilatation:  1.20  (Normal <1.22)  Lung/Heart Ratio:  .37  (Normal <0.45)  Quantitative Gated Spect Images QGS EDV:  93 ml QGS ESV:  36 ml QGS EF:  61 % QGS cine images:  Normal wall motion.    Overall Impression  Exercise Capacity: Lexiscan study BP Response: Normal blood pressure response. Clinical Symptoms: Shortness of breath ECG Impression: There are scattered PVCs. Overall Impression: Normal stress nuclear study.  Appended Document: Cardiology Nuclear Study Appt 09/15/09.  Appended Document: Cardiology Nuclear Study Discussed with pt at his office visit on 2/24 with Dr. Juanda Chance.

## 2010-08-22 NOTE — Progress Notes (Signed)
Summary: Pt req refill of Flexeirill 10mg  tabs call [facility] [place]  Phone Note Refill Request Call back at [facility] Phone [phone] Message from:  spouse-Ruby on May 04, 2010 2:30 PM  Refills Requested: Medication #1:  FLEXERIL 10 MG TABS three times a day as needed spasm   Dosage confirmed as above?Dosage Confirmed [facility]s [facility] [place] [phone]    Method Requested: Telephone to Pharmacy Initial call taken by: Lucy Antigua,  May 04, 2010 2:30 PM  Follow-up for Phone Call        call in #90 with 5 rf Follow-up by: [name] MD,  May 05, 2010 3:30 PM  Additional Follow-up for Phone Call Additional follow up Details #1::        done spouse aware Additional Follow-up by: Pura Spice, RN,  May 05, 2010 3:56 PM    New/Updated Medications: FLEXERIL 10 MG TABS (CYCLOBENZAPRINE HCL) three times a day as needed spasm Prescriptions: FLEXERIL 10 MG TABS (CYCLOBENZAPRINE HCL) three times a day as needed spasm  #90 x 5   Entered by:   Pura Spice, RN   Authorized by:   [name] MD   Signed by:   Pura Spice, RN on 05/05/2010   Method used:   [facility]  [place] [address]       [address]       [place], [place]  [address]       Ph: [phone] or [phone]       Fax: [phone]   RxID:   [id number]

## 2010-08-22 NOTE — Letter (Signed)
Summary: [facility]: Immunoassay Fecal Occult Blood (iFOB) Order [facility]  [address] [place], [place] [address]   Phone: [phone]  Fax: [phone]      [facility]: Immunoassay Fecal Occult Blood (iFOB) Order Form   May 31, 2010 MRN: [id number]   EDMOND GINSBERG February 12, 1941   Physicican Name:Raunak Antuna,MD Diagnosis Code:280.9 Anemia     Merri Ray CMA (AAMA)

## 2010-08-22 NOTE — Procedures (Signed)
Summary: EGD   EGD  Procedure date:  10/09/2005  Findings:      Location: [facility]   Patient Name: [name], [name] MRN: [id number] Procedure Procedures: Small Bowel Enteroscopy CPT: 44360.    with APC Obliteration of AVM  Personnel: Endoscopist: Barbette Hair. Arlyce Dice, MD.  Indications  Evaluation of: Anemia,  with low ferritin.  History  Current Medications: Patient is not currently taking Coumadin.  Pre-Exam Physical: Performed Oct 09, 2005  Entire physical exam was normal.  Exam Exam Info: Maximum depth of insertion Jejunum, intended Jejunum. Vocal cords visualized. Gastric retroflexion performed. ASA Classification: II. Tolerance: fair, adequate exam.  Sedation Meds: Robinul 0.2 given IV. Fentanyl 100 mcg. given IV. Versed 10 mg. given IV. Cetacaine Spray 2 sprays given aerosolized.  Monitoring: BP and pulse monitoring done. Oximetry used. Supplemental O2 given at 2 Liters.  Findings - Normal: Proximal Esophagus to Duodenal Apex.  ANGIODYSPLASIA (AVMs): Total of 1 AVMs,  maximum size 3 mm, non- bleeding, in Jejunum. ICD9: Angiodysplasia, Intestinal: 569.85.  - APC: Jejunum. Total applications: 5. Outcome: successful.   Assessment Abnormal examination, see findings above.  Diagnoses: 569.85: Angiodysplasia, Intestinal.   Events  Unplanned Intervention: No unplanned interventions were required.  Unplanned Events: There were no complications. Plans Patient Education: Patient given standard instructions for: AVMs.  Scheduling: Office Visit, to [employer]. Arlyce Dice, MD, around Oct 30, 2005.  Blood Tests, CBC around Oct 30, 2005.    cc. Gershon Crane, MD   This report was created from the original endoscopy report, which was reviewed and signed by the above listed endoscopist.

## 2010-08-22 NOTE — Progress Notes (Signed)
Summary: REFILL  Phone Note Refill Request Message from:  Fax from Pharmacy  Refills Requested: Medication #1:  VICODIN 5-500 MG TABS 4 times a day as needed pain   Brand Name Necessary? No   Last Refilled: 07/12/2009 [facility] [phone]   FAX---[phone]  Initial call taken by: Warnell Forester,  August 16, 2009 11:18 AM  Follow-up for Phone Call        call in #120 with 5 rf Follow-up by: [name] MD,  August 17, 2009 8:40 AM  Additional Follow-up for Phone Call Additional follow up Details #1::        Rx called to pharmacy Additional Follow-up by: Alfred Levins, CMA,  August 17, 2009 12:11 PM    Prescriptions: VICODIN 5-500 MG TABS (HYDROCODONE-ACETAMINOPHEN) 4 times a day as needed pain  #120 x 5   Entered by:   Alfred Levins, CMA   Authorized by:   [name] MD   Signed by:   Alfred Levins, CMA on 08/17/2009   Method used:   Telephoned to ...       [facility] [address] (retail)       [address]       [place], [place]  [address]       Ph: [phone] or [phone]       Fax: [phone]   RxID:   [id number]

## 2010-08-22 NOTE — Progress Notes (Signed)
Summary: NEW RX  Phone Note Call from Patient Call back at [phone]   Caller: Patient Call For: [name] MD Summary of Call: PT NEEDS RX FOR PERCOCET 10-650MG . PT STILL HAS A FEW PILLS LEFT. Initial call taken by: Heron Sabins,  Dec 01, 2009 12:10 PM  Follow-up for Phone Call        done Follow-up by: [name] MD,  Dec 02, 2009 8:26 AM  Additional Follow-up for Phone Call Additional follow up Details #1::        Left message to  pick up prescription. Additional Follow-up by: Lynann Beaver CMA,  Dec 02, 2009 8:32 AM    New/Updated Medications: PERCOCET 10-650 MG TABS (OXYCODONE-ACETAMINOPHEN) 1 q 6 hours as needed pain Prescriptions: PERCOCET 10-650 MG TABS (OXYCODONE-ACETAMINOPHEN) 1 q 6 hours as needed pain  #120 x 0   Entered and Authorized by:   [name] MD   Signed by:   [name] MD on 12/02/2009   Method used:   Print then Give to Patient   RxID:   [id number]

## 2010-08-22 NOTE — Progress Notes (Signed)
Summary: refills  Phone Note Refill Request Call back at Home Phone [phone] Message from:  Patient---live call  Refills Requested: Medication #1:  KETOCONAZOLE 2 % CREA three times a day as needed  Medication #2:  PERCOCET 10-650 MG TABS 1 q 6 hours as needed pain [facility]. call pt when oxycontin is ready for pickup---can pick up tomorrow.  Initial call taken by: Warnell Forester,  April 25, 2010 2:28 PM  Follow-up for Phone Call        call in ketoconazole 2% cream three times a day as needed , 60 grams with 5 rf. The Percocet rx is ready  Follow-up by: [name] MD,  April 25, 2010 2:34 PM  Additional Follow-up for Phone Call Additional follow up Details #1::        done pt aware.  Additional Follow-up by: Pura Spice, RN,  April 25, 2010 4:25 PM    New/Updated Medications: PERCOCET 10-650 MG TABS (OXYCODONE-ACETAMINOPHEN) 1 q 6 hours as needed pain Prescriptions: KETOCONAZOLE 2 % CREA (KETOCONAZOLE) three times a day as needed  #60 grams x 5   Entered by:   Pura Spice, RN   Authorized by:   [name] MD   Signed by:   Pura Spice, RN on 04/25/2010   Method used:   [facility]        [facility]  [place] [address]       [address]       [place], [place]  [address]       Ph: [phone] or [phone]       Fax: [phone]   RxID:   [id number] PERCOCET 10-650 MG TABS (OXYCODONE-ACETAMINOPHEN) 1 q 6 hours as needed pain  #120 x 0   Entered and Authorized by:   [name] MD   Signed by:   [name] MD on 04/25/2010   Method used:   Print then Give to Patient   RxID:   [id number]   Appended Document: refills reprinted percocet could not find rx.    Prescriptions: PERCOCET 10-650 MG TABS (OXYCODONE-ACETAMINOPHEN) 1 q 6 hours as needed pain  #120 x 0   Entered by:   Pura Spice, RN   Authorized by:   [name] MD   Signed by:   Pura Spice, RN on 04/26/2010   Method used:   Reprint  RxID:   [id number]

## 2010-08-22 NOTE — Progress Notes (Signed)
Summary: Schedule Colonoscopy   Phone Note Outgoing Call Call back at [facility] Phone [phone]   Call placed by: Merri Ray CMA Duncan Dull),  June 13, 2010 2:47 PM Summary of Call: Called pt to schedule colonoscopy,L/M for pt to return call Initial call taken by: Merri Ray CMA Duncan Dull),  June 13, 2010 2:47 PM  Follow-up for Phone Call        Called pt to inform needs colonoscopy, Spoke with pts wife, she will give pt the message to call back to schedule the appointment. Explained to her that if pt could not get me on the phone he can schedule with the schedulers as well. Follow-up by: Merri Ray CMA Duncan Dull),  June 19, 2010 9:35 AM

## 2010-08-22 NOTE — Assessment & Plan Note (Signed)
Summary: sinus inf/njr   Vital Signs:  Patient profile:   70 year old male Weight:      225 pounds Temp:     98.1 degrees F oral BP sitting:   108 / 70  (right arm)  Vitals Entered By: Duard Brady LPN (Dec 02, 2009 2:40 PM) CC: c/o sinus chest congestion Is Patient Diabetic? No   History of Present Illness: Here for 2 weeks of sinus pressure, HA, PND, and coughing up green sputum. No fever.   Preventive Screening-Counseling & Management  Alcohol-Tobacco     Smoking Status: quit  Allergies: 1)  ! * Zolpidem 2)  ! * Trazodone 3)  ! * Shellfish 4)  Lopressor 5)  Lipitor  Past History:  Past Medical History: Reviewed history from 03/19/2009 and no changes required. chronic left foot pain, sees Dr. Lestine Box and Dr. Ethelene Hal  4. Previous tobacco use, now discontinued. 5. Chronic microcytic anemia thought to be related to chronic GI blood     loss from AV malformations.  RESTLESS LEG SYNDROME, HX OF (ICD-V12.49) HYPOTHYROIDISM (ICD-244.9) HEADACHE (ICD-784.0) OBSTRUCTIVE SLEEP APNEA (ICD-327.23) FATIGUE (ICD-780.79) HYPERLIPIDEMIA-MIXED (ICD-272.4) HYPERTENSION, BENIGN (ICD-401.1) CAD, NATIVE VESSEL (ICD-414.01) ACUTE SINUSITIS, UNSPECIFIED (ICD-461.9) BRONCHITIS, ACUTE (ICD-466.0) MYOCARDIAL INFARCTION, HX OF (ICD-412) CORONARY ARTERY DISEASE (ICD-414.00) LOW BACK PAIN (ICD-724.2) PEPTIC ULCER DISEASE (ICD-533.90) ANEMIA-NOS (ICD-285.9) GERD   Review of Systems  The patient denies anorexia, fever, weight loss, weight gain, vision loss, decreased hearing, hoarseness, chest pain, syncope, dyspnea on exertion, peripheral edema, hemoptysis, abdominal pain, melena, hematochezia, severe indigestion/heartburn, hematuria, incontinence, genital sores, muscle weakness, suspicious skin lesions, transient blindness, difficulty walking, depression, unusual weight change, abnormal bleeding, enlarged lymph nodes, angioedema, breast masses, and testicular masses.    Physical  Exam  General:  Well-developed,well-nourished,in no acute distress; alert,appropriate and cooperative throughout examination Head:  Normocephalic and atraumatic without obvious abnormalities. No apparent alopecia or balding. Eyes:  No corneal or conjunctival inflammation noted. EOMI. Perrla. Funduscopic exam benign, without hemorrhages, exudates or papilledema. Vision grossly normal. Ears:  External ear exam shows no significant lesions or deformities.  Otoscopic examination reveals clear canals, tympanic membranes are intact bilaterally without bulging, retraction, inflammation or discharge. Hearing is grossly normal bilaterally. Nose:  External nasal examination shows no deformity or inflammation. Nasal mucosa are pink and moist without lesions or exudates. Mouth:  Oral mucosa and oropharynx without lesions or exudates.  Teeth in good repair. Neck:  No deformities, masses, or tenderness noted. Lungs:  Normal respiratory effort, chest expands symmetrically. Lungs are clear to auscultation, no crackles or wheezes.   Impression & Recommendations:  Problem # 1:  ACUTE SINUSITIS, UNSPECIFIED (ICD-461.9)  The following medications were removed from the medication list:    Atuss Ds 30-4-30 Mg/33ml Susp (Pseudoephed hcl-cpm-dm hbr tan) .[name]... 2 tsp q 4 hours His updated medication list for this problem includes:    Zithromax Z-pak 250 Mg Tabs (Azithromycin) .[name]... As directed    Hydromet 5-1.5 Mg/49ml Syrp (Hydrocodone-homatropine) .[name]... 1 tsp q 4 hours as needed cough  Complete Medication List: 1)  Ketoconazole 2 % Crea (Ketoconazole) .... Three times a day as needed 2)  Vicodin 5-500 Mg Tabs (Hydrocodone-acetaminophen) .... 4 times a day as needed pain 3)  Omeprazole 20 Mg Cpdr (Omeprazole) .... One by mouth daily 4)  Clonazepam 0.5 Mg Tabs (Clonazepam) .... Three times a day 5)  Synthroid 100 Mcg Tabs (Levothyroxine sodium) .[name].. 1 by mouth once daily 6)  Aspirin 81 Mg Tbec (Aspirin) .... One by  mouth every  day 7)  Vitamin C 1000 Mg Tabs (Ascorbic acid) .[name].. 1 by mouth once daily 8)  Simvastatin 20 Mg Tabs (Simvastatin) .... Take one tablet by mouth daily at bedtime 9)  Fish Oil Oil (Fish oil) .... Once daily 10)  Potassium Chloride Cr 10 Meq Tbcr (Potassium chloride) .... Once daily 11)  Flexeril 10 Mg Tabs (Cyclobenzaprine hcl) .... Three times a day as needed spasm 12)  Voltaren 1 % Gel (Diclofenac sodium) .... Apply 4g to lower extremeties 4 times daily.  no more than 16g on any 1 afftected joint 13)  Mirapex 1.5 Mg Tabs (Pramipexole dihydrochloride) .[name].. 1 or 2 at bedtime 14)  Percocet 10-650 Mg Tabs (Oxycodone-acetaminophen) .[name].. 1 q 6 hours as needed pain 15)  Amlodipine Besylate 5 Mg Tabs (Amlodipine besylate) .... Take one tablet by mouth daily 16)  Zithromax Z-pak 250 Mg Tabs (Azithromycin) .... As directed 17)  Hydromet 5-1.5 Mg/35ml Syrp (Hydrocodone-homatropine) .[name].. 1 tsp q 4 hours as needed cough  Patient Instructions: 1)  Please schedule a follow-up appointment as needed .  Prescriptions: HYDROMET 5-1.5 MG/5ML SYRP (HYDROCODONE-HOMATROPINE) 1 tsp q 4 hours as needed cough  #240 x 0   Entered and Authorized by:   [name] MD   Signed by:   [name] MD on 12/02/2009   Method used:   Print then Give to Patient   RxID:   [id number] ZITHROMAX Z-PAK 250 MG TABS (AZITHROMYCIN) as directed  #1 x 0   Entered and Authorized by:   [name] MD   Signed by:   [name] MD on 12/02/2009   Method used:   Print then Give to Patient   RxID:   [id number]

## 2010-08-22 NOTE — Miscellaneous (Signed)
  Clinical Lists Changes  Observations: Added new observation of RS STUDY: TRACER - Study completion 08/29/09 (10/18/2009 11:32)      Research Study Name: TRACER - Study completion 08/29/09

## 2010-08-22 NOTE — Assessment & Plan Note (Signed)
Summary: Gastroenterology  RUSLAN MCCABE MR#:  [id number] Page #  NAME:  [name], [name]  OFFICE NO:  [id number]  DATE:  08/09/05  DOB:  [birth year]  PROBLEM:  Right upper quadrant pressure.  HISTORY OF PRESENT ILLNESS:  The patient is a pleasant 70 year old white male referred through the courtesy of Dr. Clent Ridges for evaluation. He is complaining of pressure and bulging over the right upper quadrant. He is also complaining of dyspnea on exertion and loss of strength and energy. He underwent an abdominal CT that was entirely unremarkable except for coronary calcifications in an atheromatous aorta. Right upper quadrant ultrasound showed fatty infiltration of the liver. He was recently hospitalized for chest discomfort. Cardiac workup was negative. Noteworthy is a microcytic anemia. On August 06, 2005, hemoglobin was 9.8 and MCV was 68. The patient denies change in bowel habits, abdominal pain, melena, or hematochezia. He takes occasional ibuprofen. There is no history of ulcer disease. Lab work was pertinent for an elevated TSH consistent with hypothyroidism.  PAST MEDICAL HISTORY:  Is unremarkable. He is status post herniorrhaphy.  FAMILY HISTORY:  Is pertinent for mother with leukemia and father with heart disease.  MEDICATIONS:  Include baby aspirin, Prevacid, and a thyroid medicine.  ALLERGIES:  He is allergic to Lopressor and shrimp.  SOCIAL HISTORY:  He neither smokes nor drinks. 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  REVIEW OF SYSTEMS:  Is positive for cold intolerance.  PHYSICAL EXAM:  On exam pulse 88, blood pressure 138/78, weight 207.  HEENT:  EOMI. PERRLA. Sclerae are anicteric. Conjunctivae are pink.  NECK:  Supple without thyromegaly, adenopathy, or carotid bruits.  CHEST:  Clear to auscultation and percussion without adventitious sounds.  CARDIAC:  Regular rhythm; normal S1, S2. There are no murmurs, gallops, or rubs.  ABDOMEN:  There is some bulging of the right flank  and the right upper quadrant, though there is no discrete mass in his abdomen. On abdominal exam there is mild right lower quadrant tenderness without guarding or rebound; again, no masses are appreciated. There is no organomegaly. The remainder of the exam is normal.  EXTREMITIES:  Full range of motion. No cyanosis, clubbing, or edema.  RECTAL:  Stool heme negative.  IMPRESSION: 1.  Microcytic anemia. This undoubtedly is an iron-deficiency anemia, presumably secondary to gastrointestinal blood loss. Bleeding sources including polyps, arteriovenous malformations, neoplasm, and ulcer disease are considerations. 2.  Fatigue. This is probably related to his anemia. 3.  Right upper quadrant fullness. This could be secondary to his hepatic steatosis. A gastrointestinal neoplasm must be ruled out.  RECOMMENDATIONS:  Colonoscopy. If negative, I will proceed with upper endoscopy.      Barbette Hair. Arlyce Dice, M.D., F.A.C.G.  LKG/[id number] cc:  Tera Mater. Clent Ridges, MD (with consult letter) D:  08/09/05; T:  ; Job [id number]

## 2010-08-22 NOTE — Progress Notes (Signed)
Summary: refills   Phone Note From Pharmacy   Caller: [facility]  [address]  [phone]* Summary of Call: requesting refills for requip simvastatin amlopoidpine  No CPX noted in chart  Requip med removed in Feb 11 pls advise Initial call taken by: Pura Spice, RN,  April 03, 2010 3:53 PM  Follow-up for Phone Call        call in #30 with 11 rf  Follow-up by: [name] MD,  April 04, 2010 8:07 AM  Additional Follow-up for Phone Call Additional follow up Details #1::        done called [facility]  Additional Follow-up by: Pura Spice, RN,  April 04, 2010 8:27 AM    New/Updated Medications: SIMVASTATIN 20 MG TABS (SIMVASTATIN) Take one tablet by mouth daily at bedtime AMLODIPINE BESYLATE 5 MG TABS (AMLODIPINE BESYLATE) Take one tablet by mouth daily Prescriptions: AMLODIPINE BESYLATE 5 MG TABS (AMLODIPINE BESYLATE) Take one tablet by mouth daily  #30 x 11   Entered by:   Pura Spice, RN   Authorized by:   [name] MD   Signed by:   Pura Spice, RN on 04/04/2010   Method used:   Electronically to        [facility]  [phone]* (retail)       [address]       [place], [place]  [address]       Ph: [phone] or [phone]       Fax: [phone]   RxID:   [id number] SIMVASTATIN 20 MG TABS (SIMVASTATIN) Take one tablet by mouth daily at bedtime  #30 x 11   Entered by:   Pura Spice, RN   Authorized by:   [name] MD   Signed by:   Pura Spice, RN on 04/04/2010   Method used:   Electronically to        [facility]  [phone]* (retail)       [address]       [place], [place]  [address]       Ph: [phone] or [phone]       Fax: [phone]   RxID:   [id number]   Appended Document: refills  spoke with pt and he said he didn't know why [facility] for rx [facility]. [facility] and rx cancelled.........gh  rn

## 2010-08-22 NOTE — Progress Notes (Signed)
Summary: REFILL REQUEST  Phone Note Refill Request   Refills Requested: Medication #1:  PERCOCET 10-650 MG TABS 1 q 6 hours as needed pain   Notes: Pt can be reached at [phone] when Rx is ready.    Initial call taken by: Debbra Riding,  March 24, 2010 1:57 PM  Follow-up for Phone Call        done Follow-up by: [name] MD,  March 24, 2010 4:21 PM    Prescriptions: PERCOCET 10-650 MG TABS (OXYCODONE-ACETAMINOPHEN) 1 q 6 hours as needed pain  #120 x 0   Entered and Authorized by:   [name] MD   Signed by:   [name] MD on 03/24/2010   Method used:   Print then Give to Patient   RxID:   [id number]

## 2010-08-22 NOTE — Progress Notes (Signed)
Summary: Triage   Phone Note From Other Clinic   Caller: Gina @ Dr. Abran Cantor  [phone] Call For: Dr. Arlyce Dice Summary of Call: Requesting pt. be seen today. Hemoglobin has dropped to 7.9...[name] KitchenMarland KitchenCall pt. @ [phone] Initial call taken by: Karna Christmas,  April 27, 2010 8:44 AM  Follow-up for Phone Call        patient will come in today at 10:00 Follow-up by: Darcey Nora RN, CGRN,  April 27, 2010 9:25 AM

## 2010-08-22 NOTE — Assessment & Plan Note (Signed)
Summary: inflamed leg/dm   Vital Signs:  Patient profile:   70 year old male Weight:      232 pounds O2 Sat:      95 % Temp:     98 degrees F Pulse rate:   91 / minute BP sitting:   120 / 84 Cuff size:   large  Vitals Entered By: Pura Spice, RN (June 01, 2010 9:19 AM) CC: reck rt leg thinks allergic to cream  refill oxycodone    History of Present Illness: Here for several reasons. First one week ago he  developed a red itchy burning rash on the right lower leg that has not responded to Ketoconazole cream. Second, his restless legs has worsened to the point that he now takes 2 Ropinorole tablets at bedtime. This has worked well. Third he needs a refill on Percocet.   Allergies: 1)  ! * Zolpidem 2)  ! * Trazodone 3)  ! * Simivastatin 4)  ! * Shellfish 5)  Lopressor 6)  Lipitor  Past History:  Past Medical History: Reviewed history from 05/31/2010 and no changes required. chronic left foot pain, sees Dr. Lestine Box and Dr. Ethelene Hal  4. Previous tobacco use, now discontinued. 5. Chronic microcytic anemia thought to be related to chronic GI blood     loss from AV malformations.  RESTLESS LEG SYNDROME, HX OF (ICD-V12.49) HYPOTHYROIDISM (ICD-244.9) HEADACHE (ICD-784.0) OBSTRUCTIVE SLEEP APNEA (ICD-327.23) FATIGUE (ICD-780.79) HYPERLIPIDEMIA-MIXED (ICD-272.4) HYPERTENSION, BENIGN (ICD-401.1) CAD, NATIVE VESSEL (ICD-414.01) ACUTE SINUSITIS, UNSPECIFIED (ICD-461.9) 3 mm AVM in the proximal jejunum  Mild gastritis in the antrum BRONCHITIS, ACUTE (ICD-466.0) MYOCARDIAL INFARCTION, HX OF (ICD-412) CORONARY ARTERY DISEASE (ICD-414.00) LOW BACK PAIN (ICD-724.2) PEPTIC ULCER DISEASE (ICD-533.90) 2007 DIVERTICULOSIS GERD ADENOMATOUS  COLON POLYPS 2007  Review of Systems  The patient denies anorexia, fever, weight loss, weight gain, vision loss, decreased hearing, hoarseness, chest pain, syncope, dyspnea on exertion, peripheral edema, prolonged cough, headaches, hemoptysis,  abdominal pain, melena, hematochezia, severe indigestion/heartburn, hematuria, incontinence, genital sores, muscle weakness, suspicious skin lesions, transient blindness, difficulty walking, depression, unusual weight change, abnormal bleeding, enlarged lymph nodes, angioedema, breast masses, and testicular masses.    Physical Exam  General:  Well-developed,well-nourished,in no acute distress; alert,appropriate and cooperative throughout examination Lungs:  Normal respiratory effort, chest expands symmetrically. Lungs are clear to auscultation, no crackles or wheezes. Heart:  Normal rate and regular rhythm. S1 and S2 normal without gallop, murmur, click, rub or other extra sounds. Extremities:  the right lower leg has a large area of macular red scaly skin, no papules or vessicles Neurologic:  alert & oriented X3, cranial nerves II-XII intact, strength normal in all extremities, sensation intact to light touch, and gait normal.     Impression & Recommendations:  Problem # 1:  DYSHIDROTIC ECZEMA (ICD-705.81)  Problem # 2:  HYPERTENSION (ICD-401.9)  His updated medication list for this problem includes:    Amlodipine Besylate 5 Mg Tabs (Amlodipine besylate) .[name]... Take one tablet by mouth daily  Problem # 3:  RESTLESS LEG SYNDROME, HX OF (ICD-V12.49)  Problem # 4:  LOW BACK PAIN (ICD-724.2)  His updated medication list for this problem includes:    Vicodin 5-500 Mg Tabs (Hydrocodone-acetaminophen) .[name] KitchenMarland KitchenMarland KitchenMarland Kitchen 4 times a day as needed pain    Aspirin 81 Mg Tbec (Aspirin) ..... One by mouth every day    Flexeril 10 Mg Tabs (Cyclobenzaprine hcl) .[name]... Three times a day as needed spasm    Percocet 10-650 Mg Tabs (Oxycodone-acetaminophen) .[name]... 1 q 6 hours as  needed pain  Complete Medication List: 1)  Vicodin 5-500 Mg Tabs (Hydrocodone-acetaminophen) .... 4 times a day as needed pain 2)  Protonix 40 Mg Tbec (Pantoprazole sodium) .[name].. 1 tab once daily 3)  Clonazepam 0.5 Mg Tabs (Clonazepam) ....  Three times a day 4)  Aspirin 81 Mg Tbec (Aspirin) .... One by mouth every day 5)  Vitamin C 1000 Mg Tabs (Ascorbic acid) .[name].. 1 by mouth once daily 6)  Fish Oil Oil (Fish oil) .... Once daily 7)  Flexeril 10 Mg Tabs (Cyclobenzaprine hcl) .... Three times a day as needed spasm 8)  Voltaren 1 % Gel (Diclofenac sodium) .... Apply 4g to lower extremeties 4 times daily.  no more than 16g on any 1 afftected joint 9)  Ropinirole Hcl 2 Mg Tabs (Ropinirole hcl) .... 2 tabs  at bedtime 10)  Percocet 10-650 Mg Tabs (Oxycodone-acetaminophen) .[name].. 1 q 6 hours as needed pain 11)  Amlodipine Besylate 5 Mg Tabs (Amlodipine besylate) .... Take one tablet by mouth daily 12)  Levothyroxine Sodium 137 Mcg Tabs (Levothyroxine sodium) .[name].. 1 tab once daily 13)  Multivitamins Tabs (Multiple vitamin) .[name].. 1 tab once daily 14)  Nitrostat 0.4 Mg Subl (Nitroglycerin) .[name].. 1 tablet under tongue at onset of chest pain; you may repeat every 5 minutes for up to 3 doses. 15)  Ferrous Sulfate 325 (65 Fe) Mg Tabs (Ferrous sulfate) .[name].. 1 tab two times a day 16)  Calcium 500 Mg Tabs (Calcium) .[name].. 1 tab two times a day 17)  Stool Softener 250 Mg Caps (Docusate sodium) .... As needed 18)  Halobetasol Propionate 0.05 % Crea (Halobetasol propionate) .... Apply three times a day as needed for eczema  Patient Instructions: 1)  Try Halobetasol cream on the eczema. Increase nightly Ropinorole to two 2 mg tabs.  Prescriptions: PERCOCET 10-650 MG TABS (OXYCODONE-ACETAMINOPHEN) 1 q 6 hours as needed pain  #120 x 0   Entered and Authorized by:   [name] MD   Signed by:   [name] MD on 06/01/2010   Method used:   Print then Give to Patient   RxID:   [id number] ROPINIROLE HCL 2 MG TABS (ROPINIROLE HCL) 2 tabs  at bedtime  #60 x 11   Entered and Authorized by:   [name] MD   Signed by:   [name] MD on 06/01/2010   Method used:   Print then Give to Patient   RxID:   [id number] HALOBETASOL PROPIONATE  0.05 % CREA (HALOBETASOL PROPIONATE) apply three times a day as needed for eczema  #60 x 5   Entered and Authorized by:   [name] MD   Signed by:   [name] MD on 06/01/2010   Method used:   Print then Give to Patient   RxID:   [id number]    Orders Added: 1)  Est. Patient Level IV [01027]

## 2010-08-22 NOTE — Assessment & Plan Note (Signed)
Summary: not feeling well//ccm---- PT [facility] // RS   Vital Signs:  Patient profile:   70 year old male Weight:      229 pounds O2 Sat:      96 % Temp:     98.6 degrees F Pulse rate:   88 / minute BP sitting:   140 / 74  (left arm) Cuff size:   large  Vitals Entered By: Pura Spice, RN (April 25, 2010 1:18 PM) CC: fatigues easily legs and arms go numb   History of Present Illness: Here for one month of diffuse body aches and generalized fatigue. He had similar side effects to Lipitor in the past, aso this was stopped. Then he was tried on Simvastatin and took this for about 6 months. One month ago these symptoms returned and have gotten steadily worse. He stopped taking Simvastatin one week ago,and now he feels a bit better. No SOB or chest pain.   Allergies: 1)  ! * Zolpidem 2)  ! * Trazodone 3)  ! * Simivastatin 4)  ! * Shellfish 5)  Lopressor 6)  Lipitor  Past History:  Past Medical History: Reviewed history from 03/19/2009 and no changes required. chronic left foot pain, sees Dr. Lestine Box and Dr. Ethelene Hal  4. Previous tobacco use, now discontinued. 5. Chronic microcytic anemia thought to be related to chronic GI blood     loss from AV malformations.  RESTLESS LEG SYNDROME, HX OF (ICD-V12.49) HYPOTHYROIDISM (ICD-244.9) HEADACHE (ICD-784.0) OBSTRUCTIVE SLEEP APNEA (ICD-327.23) FATIGUE (ICD-780.79) HYPERLIPIDEMIA-MIXED (ICD-272.4) HYPERTENSION, BENIGN (ICD-401.1) CAD, NATIVE VESSEL (ICD-414.01) ACUTE SINUSITIS, UNSPECIFIED (ICD-461.9) BRONCHITIS, ACUTE (ICD-466.0) MYOCARDIAL INFARCTION, HX OF (ICD-412) CORONARY ARTERY DISEASE (ICD-414.00) LOW BACK PAIN (ICD-724.2) PEPTIC ULCER DISEASE (ICD-533.90) ANEMIA-NOS (ICD-285.9) GERD   Past Surgical History: Reviewed history from 03/19/2009 and no changes required. TONSILLECTOMY, HX OF (ICD-V45.79) APPENDECTOMY, HX OF (ICD-V45.79) repair of left ankle injury per Dr. Lestine Box   Review of Systems  The patient denies  anorexia, fever, weight loss, weight gain, vision loss, decreased hearing, hoarseness, chest pain, syncope, dyspnea on exertion, peripheral edema, prolonged cough, headaches, hemoptysis, abdominal pain, melena, hematochezia, severe indigestion/heartburn, hematuria, incontinence, genital sores, muscle weakness, suspicious skin lesions, transient blindness, difficulty walking, depression, unusual weight change, abnormal bleeding, enlarged lymph nodes, angioedema, breast masses, and testicular masses.         Flu Vaccine Consent Questions     Do you have a history of severe allergic reactions to this vaccine? no    Any prior history of allergic reactions to egg and/or gelatin? no    Do you have a sensitivity to the preservative Thimersol? no    Do you have a past history of Guillan-Barre Syndrome? no    Do you currently have an acute febrile illness? no    Have you ever had a severe reaction to latex? no    Vaccine information given and explained to patient? yes    Are you currently pregnant? no    Lot Number:[id number]   Exp Date:01/20/2011   Site Given  Left Deltoid IM Pura Spice, RN  April 25, 2010 1:21 PM   Physical Exam  General:  Well-developed,well-nourished,in no acute distress; alert,appropriate and cooperative throughout examination Neck:  No deformities, masses, or tenderness noted. Lungs:  Normal respiratory effort, chest expands symmetrically. Lungs are clear to auscultation, no crackles or wheezes. Heart:  Normal rate and regular rhythm. S1 and S2 normal without gallop, murmur, click, rub or other extra sounds. Neurologic:  alert & oriented  X3, cranial nerves II-XII intact, strength normal in all extremities, sensation intact to light touch, and gait normal.     Impression & Recommendations:  Problem # 1:  HYPOTHYROIDISM (ICD-244.9)  His updated medication list for this problem includes:    Synthroid 100 Mcg Tabs (Levothyroxine sodium) .[name]... 1 by mouth once  daily  Problem # 2:  HYPERTENSION, BENIGN (ICD-401.1)  His updated medication list for this problem includes:    Amlodipine Besylate 5 Mg Tabs (Amlodipine besylate) .[name]... Take one tablet by mouth daily  Problem # 3:  FATIGUE (ICD-780.79)  Orders: UA Dipstick w/o Micro (automated)  (81003) Venipuncture ([address]) TLB-BMP (Basic Metabolic Panel-BMET) (80048-METABOL) TLB-CBC Platelet - w/Differential (85025-CBCD) TLB-Hepatic/Liver Function Pnl (80076-HEPATIC) TLB-TSH (Thyroid Stimulating Hormone) (84443-TSH) TLB-CK Total Only(Creatine Kinase/CPK) (82550-CK)  Problem # 4:  HYPERLIPIDEMIA-MIXED (ICD-272.4)  The following medications were removed from the medication list:    Simvastatin 20 Mg Tabs (Simvastatin) .[name]... Take one tablet by mouth daily at bedtime  Problem # 5:  HYPERGLYCEMIA (ICD-790.29)  Orders: TLB-A1C / Hgb A1C (Glycohemoglobin) (83036-A1C)  Complete Medication List: 1)  Ketoconazole 2 % Crea (Ketoconazole) .... Three times a day as needed 2)  Vicodin 5-500 Mg Tabs (Hydrocodone-acetaminophen) .... 4 times a day as needed pain 3)  Omeprazole 20 Mg Cpdr (Omeprazole) .... One by mouth daily 4)  Clonazepam 0.5 Mg Tabs (Clonazepam) .... Three times a day 5)  Synthroid 100 Mcg Tabs (Levothyroxine sodium) .[name].. 1 by mouth once daily 6)  Aspirin 81 Mg Tbec (Aspirin) .... One by mouth every day 7)  Vitamin C 1000 Mg Tabs (Ascorbic acid) .[name].. 1 by mouth once daily 8)  Fish Oil Oil (Fish oil) .... Once daily 9)  Flexeril 10 Mg Tabs (Cyclobenzaprine hcl) .... Three times a day as needed spasm 10)  Voltaren 1 % Gel (Diclofenac sodium) .... Apply 4g to lower extremeties 4 times daily.  no more than 16g on any 1 afftected joint 11)  Mirapex 1.5 Mg Tabs (Pramipexole dihydrochloride) .[name].. 1 or 2 at bedtime 12)  Percocet 10-650 Mg Tabs (Oxycodone-acetaminophen) .[name].. 1 q 6 hours as needed pain 13)  Amlodipine Besylate 5 Mg Tabs (Amlodipine besylate) .... Take one tablet by mouth  daily  Other Orders: Admin 1st Vaccine (60454) Flu Vaccine 4yrs + (09811)  Patient Instructions: 1)  I think these are all side effects of Simvastatin, so he will stay off this. Rest, drink fluids. Check labs today   Appended Document: Orders Update     Clinical Lists Changes  Observations: Added new observation of COMMENTS: Wynona Canes, CMA  April 25, 2010 3:41 PM  (04/25/2010 15:41) Added new observation of PH URINE: 5.5  (04/25/2010 15:41) Added new observation of SPEC GR URIN: 1.025  (04/25/2010 15:41) Added new observation of APPEARANCE U: Clear  (04/25/2010 15:41) Added new observation of UA COLOR: yellow  (04/25/2010 15:41) Added new observation of WBC DIPSTK U: negative  (04/25/2010 15:41) Added new observation of NITRITE URN: negative  (04/25/2010 15:41) Added new observation of UROBILINOGEN: 1.0  (04/25/2010 15:41) Added new observation of PROTEIN, URN: negative  (04/25/2010 15:41) Added new observation of BLOOD UR DIP: negative  (04/25/2010 15:41) Added new observation of KETONES URN: negative  (04/25/2010 15:41) Added new observation of BILIRUBIN UR: negative  (04/25/2010 15:41) Added new observation of GLUCOSE, URN: negative  (04/25/2010 15:41)      Laboratory Results   Urine Tests  Date/Time Recieved: April 25, 2010 3:41 PM  Date/Time Reported: April 25, 2010 3:41 PM   Routine Urinalysis   Color: yellow Appearance: Clear Glucose: negative   (Normal Range: Negative) Bilirubin: negative   (Normal Range: Negative) Ketone: negative   (Normal Range: Negative) Spec. Gravity: 1.025   (Normal Range: 1.003-1.035) Blood: negative   (Normal Range: Negative) pH: 5.5   (Normal Range: 5.0-8.0) Protein: negative   (Normal Range: Negative) Urobilinogen: 1.0   (Normal Range: 0-1) Nitrite: negative   (Normal Range: Negative) Leukocyte Esterace: negative   (Normal Range: Negative)    Comments: Wynona Canes, CMA  April 25, 2010 3:41 PM

## 2010-08-22 NOTE — Progress Notes (Signed)
Summary: Rx Refill  Phone Note Call from Patient Call back at Home Phone [phone] Call back at [phone]   Caller: Patient Summary of Call: Rx refill for oxycodone sent to [facility]. Initial call taken by: Trixie Dredge,  February 16, 2010 12:40 PM  Follow-up for Phone Call        This was written and placed in your box. He needs to come in and pick this up.  Follow-up by: [name] MD,  February 20, 2010 5:40 PM  Additional Follow-up for Phone Call Additional follow up Details #1::        Lft msg that rx is ready for refill Additional Follow-up by: Kathrynn Speed CMA,  February 21, 2010 3:30 PM    Prescriptions: PERCOCET 10-650 MG TABS (OXYCODONE-ACETAMINOPHEN) 1 q 6 hours as needed pain  #120 x 0   Entered by:   [name] MD   Authorized by:   Raechel Ache, RN   Signed by:   [name] MD on 02/20/2010   Method used:   Print then Give to Patient   RxID:   [id number]

## 2010-08-22 NOTE — Procedures (Signed)
Summary: Upper Endoscopy  Patient: [name] Note: All result statuses are Final unless otherwise noted.  Tests: (1) Upper Endoscopy (EGD)   EGD Upper Endoscopy       DONE     [facility]     [address] [place], [place]  [address]           ENDOSCOPY PROCEDURE REPORT     PATIENT:  [name], [name]  MR#:  [id number]     BIRTHDATE:  1940/10/14, 69 yrs. old  GENDER:  male     ENDOSCOPIST:  Judie Petit T. Russella Dar, MD, [facility]           PROCEDURE DATE:  04/28/2010     PROCEDURE:  enteroscopy with control of bleeding and biopsy     ASA CLASS:  Class II     INDICATIONS:  FOBT + stool, iron deficiency anemia     MEDICATIONS:  Fentanyl 100 mcg IV, Versed 8 mg IV     TOPICAL ANESTHETIC:  Cetacaine Spray     DESCRIPTION OF PROCEDURE:   After the risks benefits and     alternatives of the procedure were thoroughly explained, informed     consent was obtained.  The [employer] P[facility] Colon [id number]) endoscope     was introduced through the mouth and advanced to the proximal     jejunum, without limitations.  The instrument was slowly withdrawn     as the mucosa was fully examined.     <<PROCEDUREIMAGES>>     AVM in the proximal jejunum. It was non-bleeding. It was 3 mm in     size. Argon plasma coagulation ablation was performed and bleeding     occured and then was controlled with repeat APC.  Mild gastritis     was found in the antrum with erythema and erosions. Multiple     biopsies were obtained and sent to pathology.  Otherwise the     examination was normal. Retroflexed views revealed no     abnormalities. The scope was then withdrawn from the patient and     the procedure completed.           COMPLICATIONS:  None           ENDOSCOPIC IMPRESSION:     1) 3 mm AVM in the proximal jejunum     2) Mild gastritis in the antrum           RECOMMENDATIONS:     1) Avoid ASA/NSAIDs for two weeks     2) Continue PPI     3) Fe replacement     4) Follow up with Dr. Clent Ridges in 4 weeks        Venita Lick. Russella Dar, MD, Clementeen Graham           CC:  [name], MD, Melvia Heaps, MD           n.     Rosalie DoctorVenita Lick. Tyrice Hewitt at 04/28/2010 09:29 AM           [name], [id number]  Note: An exclamation mark (!) indicates a result that was not dispersed into the flowsheet. Document Creation Date: 04/28/2010 9:29 AM _______________________________________________________________________  (1) Order result status: Final Collection or observation date-time: 04/28/2010 09:05 Requested date-time:  Receipt date-time:  Reported date-time:  Referring Physician:   Ordering Physician: Claudette Head [phone]) Specimen Source:  Source: Launa Grill Order Number: [id number] Lab site:

## 2010-08-22 NOTE — Procedures (Signed)
Summary: colonoscopy   Colonoscopy  Procedure date:  08/23/2005  Findings:      Location:  [facility].   Patient Name: [name], [name] MRN: [id number] Procedure Procedures: Colonoscopy CPT: 14782.    with Hot Biopsy(s)CPT: [id number].    with polypectomy. CPT: A3573898.  Personnel: Endoscopist: Barbette Hair. Arlyce Dice, MD.  Referred By: Gershon Crane, MD.  Patient Consent: Procedure, Alternatives, Risks and Benefits discussed, consent obtained, from patient.  Indications  Evaluation of: Anemia with low ferritin.  History  Current Medications: Patient is not currently taking Coumadin.  Pre-Exam Physical: Performed Aug 23, 2005. Cardio-pulmonary exam, HEENT exam , Abdominal exam, Mental status exam WNL.  Comments: Patient history reviewed/updated, physical performed prior to initiation of sedation? Exam Exam: Extent of exam reached: Cecum, extent intended: Cecum.  The cecum was identified by IC valve. Colon retroflexion performed. ASA Classification: II. Tolerance: good.  Monitoring: Pulse and BP monitoring, Oximetry used. Supplemental O2 given. at 2 Liters.  Colon Prep Used Moviprep for colon prep. Prep results: fair, adequate exam.  Sedation Meds: Patient assessed and found to be appropriate for moderate (conscious) sedation. Sedation was managed by the Endoscopist. Fentanyl 100 mcg. given IV. Versed 10 mg. given IV.  Findings POLYP: Descending Colon, Maximum size: 2 mm. Procedure:  hot biopsy, Polyp sent to pathology. ICD9: Colon Polyps: 211.3.  POLYP: Descending Colon, Maximum size: 2 mm. Procedure:  hot biopsy, sent to pathology. ICD9: Colon Polyps: 211.3.  POLYP: Descending Colon, Maximum size: 6 mm. Procedure:  snare with cautery, sent to pathology. ICD9: Colon Polyps: 211.3.  - DIVERTICULOSIS: Descending Colon to Sigmoid Colon. ICD9: Diverticulosis: 562.10. Comments: Scattered diverticula.  NORMAL EXAM: Cecum.  NORMAL EXAM: Rectum.     Comments: Nonbleeding polyps Assessment Abnormal examination, see findings above.  Diagnoses: 211.3: Colon Polyps.  562.10: Diverticulosis.   Events  Unplanned Interventions: No intervention was required.  Unplanned Events: There were no complications. Plans  Post Exam Instructions: Post sedation instructions given.  Patient Education: Patient given standard instructions for: Polyps. Diverticulosis.  Disposition: After procedure patient sent to recovery. After recovery patient sent home.  Scheduling/Referral: EGD, to Molly Maduro D. Arlyce Dice, MD, around Aug 30, 2005.  Colonoscopy, to Barbette Hair. Arlyce Dice, MD, around Aug 23, 2010.    cc. Gershon Crane, MD   This report was created from the original endoscopy report, which was reviewed and signed by the above listed endoscopist.

## 2010-08-22 NOTE — Consult Note (Signed)
Summary: [facility]   Imported By: Roderic Ovens 05/09/2010 15:08:19  _____________________________________________________________________  External Attachment:    Type:   Image     Comment:   External Document

## 2010-08-22 NOTE — Assessment & Plan Note (Signed)
Summary: anemia/sheri      Allergies Added:   History of Present Illness Visit Type: Initial Consult Primary GI MD: Melvia Heaps MD [facility] Primary Provider: [name] MD Requesting Provider: [name] MD Chief Complaint: Anemia, pt states he has been having severe leg cramps, "Had a bad night" Pt states iron makes pt sick History of Present Illness:   PLEASANT 69 YO MALE KNOWN TO DR. KAPLAN . HE IS REFERRED TODAY FOR ANEMIA WITH HGB OF 7.9,MCV 64.6 ON LABS DRAWN YESTERDAY,AFTER PT SEEN BY DR. Clent Ridges FOR C/O FATIGUS AND WEAKNESS.  PT C/O PROGRESSIVE WEAKNESS OVER THE PAST 2 MONTHS. HE SAYS IT HAS GOTTEN SO BAD HE FEELS LIKE HE MIGHT DIE EVERY TIME HE WALKS TO THE MAIBOX AT HOME-HE HAS TO SIT DOWN OR LAY OVER HIS CAR TO CATCH HIS BREATH. HIS HEART FEELS LIKE IT IS COMINNG OUT OF HIS CHEST. HE HAS ALSO BEEN HAVING BILATERAL LOWER EXTREMITIY CRAMPING-INITIALLY THOUGHT DUE TO STATINS,WHICH HE IS OFF OF,WORSE RECENTLY. HE HAS NOT NOTED ANY BLOOD IN STOOLS OR DARK STOOLS.NO ABDOMINAL PAIN,APPETITE FINE, WEIGHT STABKE, NO HEARTBURN OR DYSPHAGIA. OCCASIONAL EXERTIONAL CHEST PRESSURE. HE TAKES A BABY ASA, NO THINNERS.  HE HAS HX OF PROXIMAL JEJUNAL AVM ON  ENTEROSCOPY [id number] WHICH WAS APC'D,  ALSO A SMALL GASTRIC ULCER ON EGD IN 2007. COLONOSCOPY 2007 WITH DIVERTICULOSIS, AND  3 POLYPS-ADENOMATOUS.  HGB IN 2/11 WAS 14.0.   GI Review of Systems    Reports chest pain.      Denies abdominal pain, acid reflux, belching, bloating, dysphagia with liquids, dysphagia with solids, heartburn, loss of appetite, nausea, vomiting, vomiting blood, weight loss, and  weight gain.      Reports constipation.     Denies anal fissure, black tarry stools, change in bowel habit, diarrhea, diverticulosis, fecal incontinence, heme positive stool, hemorrhoids, irritable bowel syndrome, jaundice, light color stool, liver problems, rectal bleeding, and  rectal pain.    Current Medications (verified): 1)  Ketoconazole 2 %  Crea (Ketoconazole) .... Three Times A Day As Needed 2)  Vicodin 5-500 Mg Tabs (Hydrocodone-Acetaminophen) .... 4 Times A Day As Needed Pain 3)  Omeprazole 20 Mg Cpdr (Omeprazole) .... One By Mouth Daily 4)  Clonazepam 0.5 Mg Tabs (Clonazepam) .... Three Times A Day 5)  Aspirin 81 Mg  Tbec (Aspirin) .... One By Mouth Every Day 6)  Vitamin C 1000 Mg  Tabs (Ascorbic Acid) .[name].. 1 By Mouth Once Daily 7)  Fish Oil   Oil (Fish Oil) .... Once Daily 8)  Flexeril 10 Mg Tabs (Cyclobenzaprine Hcl) .... Three Times A Day As Needed Spasm 9)  Voltaren 1 % Gel (Diclofenac Sodium) .... Apply 4g To Lower Extremeties 4 Times Daily.  No More Than 16g On Any 1 Afftected Joint 10)  Mirapex 1.5 Mg Tabs (Pramipexole Dihydrochloride) .[name].. 1 or 2 At Bedtime 11)  Percocet 10-650 Mg Tabs (Oxycodone-Acetaminophen) .[name].. 1 Q 6 Hours As Needed Pain 12)  Amlodipine Besylate 5 Mg Tabs (Amlodipine Besylate) .... Take One Tablet By Mouth Daily 13)  Synthroid 150 Mcg Tabs (Levothyroxine Sodium) .[name].. 1 By Mouth Once Daily  Allergies (verified): 1)  ! * Zolpidem 2)  ! * Trazodone 3)  ! * Simivastatin 4)  ! * Shellfish 5)  Lopressor 6)  Lipitor  Past History:  Past Medical History: chronic left foot pain, sees Dr. Lestine Box and Dr. Ethelene Hal  4. Previous tobacco use, now discontinued. 5. Chronic microcytic anemia thought to be related to chronic GI  blood     loss from AV malformations.  RESTLESS LEG SYNDROME, HX OF (ICD-V12.49) HYPOTHYROIDISM (ICD-244.9) HEADACHE (ICD-784.0) OBSTRUCTIVE SLEEP APNEA (ICD-327.23) FATIGUE (ICD-780.79) HYPERLIPIDEMIA-MIXED (ICD-272.4) HYPERTENSION, BENIGN (ICD-401.1) CAD, NATIVE VESSEL (ICD-414.01) ACUTE SINUSITIS, UNSPECIFIED (ICD-461.9) BRONCHITIS, ACUTE (ICD-466.0) MYOCARDIAL INFARCTION, HX OF (ICD-412) CORONARY ARTERY DISEASE (ICD-414.00) LOW BACK PAIN (ICD-724.2) PEPTIC ULCER DISEASE (ICD-533.90) 2007 DIVERTICULOSIS GERD ADENOMATOUS  COLON [id number]     Past Surgical  History: Reviewed history from 04/27/2010 and no changes required. TONSILLECTOMY, HX OF (ICD-V45.79) APPENDECTOMY, HX OF (ICD-V45.79) repair of left ankle injury per Dr. Lestine Box  Hernia Surgery  Family History: Reviewed history from 12/13/2008 and no changes required. heart disease: father   Social History: Reviewed history from 12/13/2008 and no changes required. history of smoking x 50 years, 2 ppd.  pt has quit.   Married with children.  Alcohol use-no Pt works as a Engineer, drilling.    Review of Systems       The patient complains of back pain, depression-new, fatigue, hearing problems, and muscle pains/cramps.  The patient denies allergy/sinus, anemia, anxiety-new, arthritis/joint pain, blood in urine, breast changes/lumps, change in vision, confusion, cough, coughing up blood, fainting, fever, headaches-new, heart murmur, heart rhythm changes, itching, night sweats, nosebleeds, shortness of breath, skin rash, sleeping problems, sore throat, swelling of feet/legs, swollen lymph glands, thirst - excessive, urination - excessive, urination changes/pain, urine leakage, vision changes, and voice change.         SEE HPI  Vital Signs:  Patient profile:   70 year old male Height:      70 inches Weight:      230 pounds BMI:     33.12 BSA:     2.22 Pulse rate:   80 / minute Pulse rhythm:   regular BP sitting:   130 / 80  (left arm)  Vitals Entered By: Merri Ray CMA (AAMA) (April 27, 2010 10:14 AM)  Physical Exam  General:  Well developed, well nourished, no acute distress.,PALE Head:  Normocephalic and atraumatic. Eyes:  PERRLA, no icterus. Lungs:  rhonchi bilateral.   Heart:  Regular rate and rhythm; no murmurs, rubs,  or bruits. Abdomen:  SOFT, NONTENDER, NO MASS OR HSM,BS+ Rectal:  BROWN HEME POSITIVE STOOL Extremities:  No clubbing, cyanosis, edema or deformities noted. Neurologic:  Alert and  oriented x4;  grossly normal neurologically. Psych:  Alert and  cooperative. Normal mood and affect.   Impression & Recommendations:  Problem # 1:  ANEMIA DUE TO CHRONIC BLOOD LOSS (ICD-280.0) 69 YO MALE WITH SEVERE ANEMIA, HEME POSITIVE STOOL-SYMPTOMATIC WITH WEAKNESS,EXERTIONAL DYPNEA, FATIGUE.  SUSPECT BLOOD LOSS SECONDARY TO SMALL BOWEL AVMS. R/O PUD, R/O OCCULT COLON LESION.   ADMIT TO [facility] FOR BLOOD TRANSFUSIONS FE STUDIES,CONSIDER IRON INFUSION SCHEDULE FOR EGD WITH ENTEROSCOPY WITH DR. Russella Dar   IN AM.. SEE ORDERS  Problem # 2:  PERSONAL HX COLONIC POLYPS (ICD-V12.72) Assessment: Comment Only ADENOMATOUS-LAST COLON 2007  Problem # 3:  DIVERTICULOSIS-COLON (ICD-562.10) Assessment: Comment Only  Problem # 4:  SLEEP APNEA (ICD-780.57) Assessment: Comment Only  Problem # 5:  HYPERTENSION (ICD-401.9) Assessment: Comment Only  Problem # 6:  CAD, NATIVE VESSEL (ICD-414.01) Assessment: Comment Only S/P CABG  Problem # 7:  RESTLESS LEG SYNDROME, HX OF (ICD-V12.49) Assessment: Comment Only PERSISTENT LOWER EXTREMITY CRAMPING-R/O CLAUDICATION  CHECK ARTERIAL DOPPLERS.

## 2010-08-22 NOTE — Letter (Signed)
Summary: Generic Letter  Architectural technologist, Main Office  [address]   [place], [place] [address]   Phone: [phone]  Fax: [phone]        May 30, 2010 MRN: [id number]    CHRISHON MARTINO [address] [place], [place]  [address]    Dear Mr. MERGEN,  I have tried unsuccessfully to reach you by phone, but wanted to let you know the results of the labs you had done in our office. Dr. Juanda Chance was checking your Complete Blood Count (CBC) to evaluate your hemoglobin level for anemia. We like to see that number at 12 or above and yours was 12.1. This panel also checks your platelet count, which is a clotting mechanism, and they are normal as well. Please feel free to contact our office with any questions.         Sincerely,  Sherri Rad, RN, BSN  This letter has been electronically signed by your physician.

## 2010-08-22 NOTE — Progress Notes (Signed)
Summary: Nuclear Pre-Procedures  Phone Note Outgoing Call   Call placed by: Milana Na, EMT-P,  September 06, 2009 12:57 PM Summary of Call: Left message with information on [employer] Information Sheet (see scanned document for details).      Nuclear Med Background Indications for Stress Test: Evaluation for Ischemia, Post Hospital  Indications Comments: 08/29/09 CP/SOB (-)enzymes sinus pauses in hosp.  History: GXT, Heart Catheterization, Myocardial Infarction  History Comments: 2/09 NSTEMI>Cath:Nonobst. CAD,EF=55% 8/09 GXT:(-) ischemia; NonDx. d/t unable to reach target HR.  Symptoms: Chest Pain, DOE, Fatigue, Nausea, Palpitations    Nuclear Pre-Procedure Cardiac Risk Factors: Family History - CAD, History of Smoking, Hypertension, Lipids, Obesity Height (in): 70  Nuclear Med Study Referring MD:  B. Juanda Chance MD

## 2010-08-22 NOTE — Procedures (Signed)
Summary: EGD   EGD  Procedure date:  08/30/2005  Findings:      Location: [facility]   Patient Name: [name], [name] MRN: [id number] Procedure Procedures: Panendoscopy (EGD) CPT: 43235.    with biopsy(s)/brushing(s). CPT: D1846139.  Personnel: Endoscopist: Barbette Hair. Arlyce Dice, MD.  Referred By: Gershon Crane, MD.  Indications  Evaluation of: Anemia,  with low ferritin.  History  Current Medications: Patient is not currently taking Coumadin.  Pre-Exam Physical: Performed Aug 30, 2005  Cardio-pulmonary exam, HEENT exam, Abdominal exam, Mental status exam WNL.  Comments: Patient history reviewed and updated, pre-procedure physical performed prior to initiation of sedation? Exam Exam Info: Maximum depth of insertion Duodenum, intended Duodenum. Vocal cords visualized. Gastric retroflexion performed. ASA Classification: I. Tolerance: good.  Sedation Meds: Robinul 0.2 given IV. Fentanyl 50 mcg. given IV. Versed 5 mg. given IV. Cetacaine Spray 2 sprays given aerosolized.  Monitoring: BP and pulse monitoring done. Oximetry used. Supplemental O2 given at 2 Liters.  Findings - ULCER: in Antrum Maximum size: 3 mm. Other bleeding status, see comments. ICD9: Ulcer, Gastric, Chronic with Hemorrhage: 531.40. Comment: Small amount of fresh blood on one ulcer.  There are 2 discreet superficial ulcers.   Assessment Abnormal examination, see findings above.  Diagnoses: 531.40: Ulcer, Gastric, Chronic with Hemorrhage.   Events  Unplanned Intervention: No unplanned interventions were required.  Unplanned Events: There were no complications. Plans Medication(s): DC current medications. PPI: Pantoprazole/Protonix 40 mg QD, starting Aug 30, 2005   Disposition: After procedure patient sent to recovery. After recovery patient sent home.  Scheduling: Office Visit, to [employer]. Arlyce Dice, MD, around Sep 27, 2005.  Home stool hemocults, around Sep 06, 2005.  Blood Tests,  CBC around Sep 27, 2005.   cc. Gershon Crane.MD   This report was created from the original endoscopy report, which was reviewed and signed by the above listed endoscopist.

## 2010-08-22 NOTE — Progress Notes (Signed)
Summary: refill vicodin  Phone Note Refill Request Message from:  Fax from Pharmacy on March 17, 2010 9:06 AM  Refills Requested: Medication #1:  VICODIN 5-500 MG TABS 4 times a day as needed pain   Dosage confirmed as above?Dosage Confirmed   Last Refilled: 02/12/2010  Method Requested: Fax to Local Pharmacy Initial call taken by: Raechel Ache, RN,  March 17, 2010 9:07 AM Caller: [place]  Follow-up for Phone Call        call in #120 with 5 rf Follow-up by: [name] MD,  March 17, 2010 11:17 AM  Additional Follow-up for Phone Call Additional follow up Details #1::        Rx faxed to pharmacy Additional Follow-up by: Raechel Ache, RN,  March 17, 2010 11:27 AM    Prescriptions: VICODIN 5-500 MG TABS (HYDROCODONE-ACETAMINOPHEN) 4 times a day as needed pain  #120 x 5   Entered by:   Raechel Ache, RN   Authorized by:   [name] MD   Signed by:   Raechel Ache, RN on 03/17/2010   Method used:   Historical   RxID:   [id number]

## 2010-08-22 NOTE — Assessment & Plan Note (Signed)
Summary: eph  Medications Added PROTONIX 40 MG TBEC (PANTOPRAZOLE SODIUM) 1 tab once daily ROPINIROLE HCL 2 MG TABS (ROPINIROLE HCL) 1 tab at bedtime LEVOTHYROXINE SODIUM 137 MCG TABS (LEVOTHYROXINE SODIUM) 1 tab once daily MULTIVITAMINS   TABS (MULTIPLE VITAMIN) 1 tab once daily NITROSTAT 0.4 MG SUBL (NITROGLYCERIN) 1 tablet under tongue at onset of chest pain; you may repeat every 5 minutes for up to 3 doses. FERROUS SULFATE 325 (65 FE) MG TABS (FERROUS SULFATE) 1 tab two times a day CALCIUM 500 MG TABS (CALCIUM) 1 tab two times a day PRAVASTATIN SODIUM 20 MG TABS (PRAVASTATIN SODIUM) Take one tablet by mouth daily at bedtime        Visit Type:  EPH Referring Provider:  [name] MD Primary Provider:  [name] MD  CC:  chest discomfort (stabbing)....sob w/walking far distances....  History of Present Illness: Mr. Lage is 70 years and returned for management of M.D. after his recent hospitalization.  he had been on estrogen MI in 2009 and the catheterization had nonobstructive CAD. He was in February 2001 with chest pain and had a negative Myoview. This October he was admitted from the GI office was in acute GI bleeding which was started malformations. He required transfusions. He developed chest pain associated with this balloon had been elevated troponins and was transferred but did not recommend any further evaluation.  Plans includes obstructive sleep apnea hypertension hyperlipidemia. He's been able to use CPAP. He has a history of bradycardia on beta blockers.  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  Current Medications (verified): 1)  Ketoconazole 2 % Crea (Ketoconazole) .... Three Times A Day As Needed 2)  Vicodin 5-500 Mg Tabs (Hydrocodone-Acetaminophen) .... 4 Times A Day As Needed Pain 3)  Protonix 40 Mg Tbec (Pantoprazole Sodium) .[name].. 1 Tab Once Daily 4)  Clonazepam 0.5 Mg  Tabs (Clonazepam) .... Three Times A Day 5)  Aspirin 81 Mg  Tbec (Aspirin) .... One By Mouth Every Day 6)  Vitamin C 1000 Mg  Tabs (Ascorbic Acid) .[name].. 1 By Mouth Once Daily 7)  Fish Oil   Oil (Fish Oil) .... Once Daily 8)  Flexeril 10 Mg Tabs (Cyclobenzaprine Hcl) .... Three Times A Day As Needed Spasm 9)  Voltaren 1 % Gel (Diclofenac Sodium) .... Apply 4g To Lower Extremeties 4 Times Daily.  No More Than 16g On Any 1 Afftected Joint 10)  Ropinirole Hcl 2 Mg Tabs (Ropinirole Hcl) .[name].. 1 Tab At Bedtime 11)  Percocet 10-650 Mg Tabs (Oxycodone-Acetaminophen) .[name].. 1 Q 6 Hours As Needed Pain 12)  Amlodipine Besylate 5 Mg Tabs (Amlodipine Besylate) .... Take One Tablet By Mouth Daily 13)  Levothyroxine Sodium 137 Mcg Tabs (Levothyroxine Sodium) .[name].. 1 Tab Once Daily 14)  Multivitamins   Tabs (Multiple Vitamin) .[name].. 1 Tab Once Daily 15)  Nitrostat 0.4 Mg Subl (Nitroglycerin) .[name].. 1 Tablet Under Tongue At Onset of Chest Pain; You May Repeat Every 5 Minutes For Up To 3 Doses. 16)  Ferrous Sulfate 325 (65 Fe) Mg Tabs (Ferrous Sulfate) .[name].. 1 Tab Two Times A Day 17)  Calcium 500 Mg Tabs (Calcium) .[name].. 1 Tab Two Times A Day  Allergies: 1)  ! * Zolpidem 2)  ! * Trazodone 3)  ! * Simivastatin 4)  ! * Shellfish 5)  Lopressor 6)  Lipitor  Past History:  Past Medical History: Reviewed history from 04/27/2010  and no changes required. chronic left foot pain, sees Dr. Lestine Box and Dr. Ethelene Hal  4. Previous tobacco use, now discontinued. 5. Chronic microcytic anemia thought to be related to chronic GI blood     loss from AV malformations.  RESTLESS LEG SYNDROME, HX OF (ICD-V12.49) HYPOTHYROIDISM (ICD-244.9) HEADACHE (ICD-784.0) OBSTRUCTIVE SLEEP APNEA (ICD-327.23) FATIGUE (ICD-780.79) HYPERLIPIDEMIA-MIXED (ICD-272.4) HYPERTENSION, BENIGN (ICD-401.1) CAD, NATIVE VESSEL (ICD-414.01) ACUTE SINUSITIS, UNSPECIFIED (ICD-461.9) BRONCHITIS, ACUTE (ICD-466.0) MYOCARDIAL INFARCTION, HX OF (ICD-412) CORONARY  ARTERY DISEASE (ICD-414.00) LOW BACK PAIN (ICD-724.2) PEPTIC ULCER DISEASE (ICD-533.90) 2007 DIVERTICULOSIS GERD ADENOMATOUS  COLON POLYPS2007     Review of Systems       ROS is negative except as outlined in HPI.   Vital Signs:  Patient profile:   70 year old male Height:      70 inches Weight:      229.8 pounds BMI:     33.09 Pulse rate:   85 / minute Pulse rhythm:   irregular BP sitting:   116 / 76  (left arm) Cuff size:   large  Vitals Entered By: Danielle Rankin, CMA (May 19, 2010 10:16 AM)  Physical Exam  Additional Exam:  Gen. Well-nourished, in no distress   Neck: No JVD, thyroid not enlarged, no carotid bruits Lungs: No tachypnea, clear without rales, rhonchi or wheezes Cardiovascular: Rhythm regular, PMI not displaced,  heart sounds  normal, no murmurs or gallops, no peripheral edema, pulses normal in all 4 extremities. Abdomen: BS normal, abdomen soft and non-tender without masses or organomegaly, no hepatosplenomegaly. MS: No deformities, no cyanosis or clubbing   Neuro:  No focal sns   Skin:  no lesions    Impression & Recommendations:  Problem # 1:  CAD, NATIVE VESSEL (ICD-414.01)  He had some nonobstructive see him as described in the history of present illness or chest pain and elevated troponins at the time of discharge. He's had no recurrent symptoms since then. His ECG today is normal. And plan further evaluation of his dizziness he has recurrent symptoms. His updated medication list for this problem includes:    Aspirin 81 Mg Tbec (Aspirin) ..... One by mouth every day    Amlodipine Besylate 5 Mg Tabs (Amlodipine besylate) .[name]... Take one tablet by mouth daily    Nitrostat 0.4 Mg Subl (Nitroglycerin) .[name]... 1 tablet under tongue at onset of chest pain; you may repeat every 5 minutes for up to 3 doses.  Orders: EKG w/ Interpretation (93000) TLB-CBC Platelet - w/Differential (85025-CBCD)  His updated medication list for this problem includes:     Aspirin 81 Mg Tbec (Aspirin) ..... One by mouth every day    Amlodipine Besylate 5 Mg Tabs (Amlodipine besylate) .[name]... Take one tablet by mouth daily    Nitrostat 0.4 Mg Subl (Nitroglycerin) .[name]... 1 tablet under tongue at onset of chest pain; you may repeat every 5 minutes for up to 3 doses.  Problem # 2:  HYPERTENSION (ICD-401.9)  This is well controlled on current therapy. His updated medication list for this problem includes:    Aspirin 81 Mg Tbec (Aspirin) ..... One by mouth every day    Amlodipine Besylate 5 Mg Tabs (Amlodipine besylate) .[name]... Take one tablet by mouth daily  Orders: EKG w/ Interpretation (93000) TLB-CBC Platelet - w/Differential (85025-CBCD)  His updated medication list for this problem includes:    Aspirin 81 Mg Tbec (Aspirin) ..... One by mouth every day    Amlodipine Besylate 5 Mg Tabs (Amlodipine besylate) .[name]... Take one tablet by mouth  daily  Problem # 3:  HYPERLIPIDEMIA-MIXED (ICD-272.4)  He has been on simvastatin in the past. He's tried pravastatin and we'll start this today and check a lipid and liver profile in 6 weeks. His updated medication list for this problem includes:    Pravastatin Sodium 20 Mg Tabs (Pravastatin sodium) .[name]... Take one tablet by mouth daily at bedtime  His updated medication list for this problem includes:    Pravastatin Sodium 20 Mg Tabs (Pravastatin sodium) .[name]... Take one tablet by mouth daily at bedtime  Problem # 4:  ANEMIA DUE TO CHRONIC BLOOD LOSS (ICD-280.0) He was hospitalized with a GI bleed related to AV malformations.  We will check a hemoglobin today and arrange GI 5.  Patient Instructions: 1)  Labwork today: cbc (414.01) 2)  Labwork in 6 weeks: lipid/liver (414.01;272.2) 3)  Start Pravstatin 20mg  once daily. 4)  Followup with GI in 2-3 weeks- Dr. Russella Dar. 5)  Your physician wants you to follow-up in: 1 year with Dr. Clifton James.   You will receive a reminder letter in the mail two months in advance. If you don't  receive a letter, please call our office to schedule the follow-up appointment. Prescriptions: PRAVASTATIN SODIUM 20 MG TABS (PRAVASTATIN SODIUM) Take one tablet by mouth daily at bedtime  #30 x 11   Entered by:   Sherri Rad, RN, BSN   Authorized by:   Lenoria Farrier, MD, [facility]   Signed by:   Sherri Rad, RN, BSN on 05/19/2010   Method used:   Electronically to        [facility]  [place] [address] (retail)       [address]       [place], [place]  [address]       Ph: [phone] or [phone]       Fax: [phone]   RxID:   [id number]

## 2010-08-22 NOTE — Progress Notes (Signed)
Summary: Pt req status of Percocet script. Wants to pick up today.  Phone Note Call from Patient Call back at [phone] cell    Caller: Patient Summary of Call: Pt called req status of percocet script. Pt would like to pick this up today. Pls call when ready.  Initial call taken by: Lucy Antigua,  February 21, 2010 1:52 PM  Follow-up for Phone Call        it is ready to be picked up now  Follow-up by: [name] MD,  February 21, 2010 3:24 PM  Additional Follow-up for Phone Call Additional follow up Details #1::        Pt has been informed that script is ready for pick up, as noted above.    Additional Follow-up by: Lucy Antigua,  February 21, 2010 3:28 PM

## 2010-08-22 NOTE — Progress Notes (Signed)
Summary: Pt req refill of Oxycodone  Phone Note Call from Patient Call back at Home Phone [phone]   Caller: Patient Summary of Call: Pt req refill of Oxycodone to [facility].  Initial call taken by: Lucy Antigua,  August 19, 2009 10:17 AM  Follow-up for Phone Call        done, but he needs to pick this up Follow-up by: [name] MD,  August 19, 2009 1:21 PM  Additional Follow-up for Phone Call Additional follow up Details #1::        rx up front, ready for p/u, pt aware Additional Follow-up by: Alfred Levins, CMA,  August 19, 2009 1:34 PM    Prescriptions: PERCOCET 10-650 MG TABS (OXYCODONE-ACETAMINOPHEN) 1 q 6 hours as needed pain  #120 x 0   Entered and Authorized by:   [name] MD   Signed by:   [name] MD on 08/19/2009   Method used:   Print then Give to Patient   RxID:   [id number]

## 2010-08-24 NOTE — Assessment & Plan Note (Signed)
Summary: post hospital gi bleed/sheri    History of Present Illness Visit Type: consult  Primary GI MD: Melvia Heaps MD [facility] Primary Provider: Gershon Crane, MD  Requesting Provider: Charlies Constable, MD  Chief Complaint: [facility] f/u for GI bleed. Pt c/o fatigue and constipation  History of Present Illness:   This is a post hospitalization visit for Mr. Heiland.  He was hospitalized in October, 2011 with a symptomatic iron deficiency anemia.  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  He has a history of adenomatous polyps that were removed in 2007.  He has had no overt GI bleeding.   He currently has no GI complaints.  A cardiac catheterization during the hospitalization did not demonstrate significant obstructive coronary artery disease.  He remains on Protonix and iron.   GI Review of Systems      Denies abdominal pain, acid reflux, belching, bloating, chest pain, dysphagia with liquids, dysphagia with solids, heartburn, loss of appetite, nausea, vomiting, vomiting blood, weight loss, and  weight gain.      Reports constipation.     Denies anal fissure, black tarry stools, change in bowel habit, diarrhea, diverticulosis, fecal incontinence, heme positive stool, hemorrhoids, irritable bowel syndrome, jaundice, light color stool, liver problems, rectal bleeding, and  rectal pain.    Current Medications (verified): 1)  Vicodin 5-500 Mg Tabs (Hydrocodone-Acetaminophen) .... 4 Times A Day As Needed Pain 2)  Protonix 40 Mg Tbec (Pantoprazole Sodium) .[name].. 1 Tab Once Daily 3)  Clonazepam 0.5 Mg Tabs (Clonazepam) .... Three Times A Day 4)  Aspirin 81 Mg  Tbec (Aspirin) .... One By Mouth Every Day 5)  Vitamin C 1000 Mg  Tabs (Ascorbic Acid) .[name].. 1 By Mouth Once Daily 6)  Fish Oil   Oil (Fish Oil) .... Once Daily 7)  Flexeril 10 Mg Tabs (Cyclobenzaprine Hcl) .... Three Times A Day As Needed Spasm 8)  Voltaren 1 % Gel (Diclofenac Sodium) .... Apply 4g To Lower Extremeties 4 Times Daily.   No More Than 16g On Any 1 Afftected Joint 9)  Ropinirole Hcl 2 Mg Tabs (Ropinirole Hcl) .[name].. 1 Tab At Bedtime 10)  Percocet 10-650 Mg Tabs (Oxycodone-Acetaminophen) .[name].. 1 Q 6 Hours As Needed Pain 11)  Amlodipine Besylate 5 Mg Tabs (Amlodipine Besylate) .... Take One Tablet By Mouth Daily 12)  Levothyroxine Sodium 137 Mcg Tabs (Levothyroxine Sodium) .[name].. 1 Tab Once Daily 13)  Multivitamins   Tabs (Multiple Vitamin) .[name].. 1 Tab Once Daily 14)  Nitrostat 0.4 Mg Subl (Nitroglycerin) .[name].. 1 Tablet Under Tongue At Onset of Chest Pain; You May Repeat Every 5 Minutes For Up To 3 Doses. 15)  Ferrous Sulfate 325 (65 Fe) Mg Tabs (Ferrous Sulfate) .[name].. 1 Tab Two Times A Day 16)  Calcium 500 Mg Tabs (Calcium) .[name].. 1 Tab Two Times A Day 17)  Stool Softener 250 Mg Caps (Docusate Sodium) .... As Needed  Allergies (verified): 1)  ! * Zolpidem 2)  ! * Trazodone 3)  ! * Simivastatin 4)  ! * Shellfish 5)  Lopressor 6)  Lipitor  Past History:  Past Medical History: chronic left foot pain, sees Dr. Lestine Box and Dr. Ethelene Hal  4. Previous tobacco use, now discontinued. 5. Chronic microcytic anemia thought to be related to chronic GI blood     loss from AV malformations.  RESTLESS LEG SYNDROME, HX OF (ICD-V12.49) HYPOTHYROIDISM (ICD-244.9) HEADACHE (ICD-784.0) OBSTRUCTIVE SLEEP APNEA (ICD-327.23) FATIGUE (ICD-780.79) HYPERLIPIDEMIA-MIXED (ICD-272.4) HYPERTENSION, BENIGN (ICD-401.1) CAD, NATIVE VESSEL (ICD-414.01) ACUTE SINUSITIS, UNSPECIFIED (ICD-461.9)  3 mm AVM in the proximal jejunum  Mild gastritis in the antrum BRONCHITIS, ACUTE (ICD-466.0) MYOCARDIAL INFARCTION, HX OF (ICD-412) CORONARY ARTERY DISEASE (ICD-414.00) LOW BACK PAIN (ICD-724.2) PEPTIC ULCER DISEASE (ICD-533.90) 2007 DIVERTICULOSIS GERD ADENOMATOUS  COLON POLYPS 2007  Past Surgical History: Reviewed history from 04/27/2010 and no changes required. TONSILLECTOMY, HX OF (ICD-V45.79) APPENDECTOMY, HX OF (ICD-V45.79) repair of left  ankle injury per Dr. Lestine Box  Hernia Surgery  Family History: heart disease: father  No FH of Colon Cancer:  Social History: Reviewed history from 12/13/2008 and no changes required. history of smoking x 50 years, 2 ppd.  pt has quit.   Married with children.  Alcohol use-no Pt works as a Engineer, drilling.    Review of Systems       The patient complains of arthritis/joint pain, fatigue, itching, muscle pains/cramps, and skin rash.  The patient denies allergy/sinus, anemia, anxiety-new, back pain, blood in urine, breast changes/lumps, change in vision, confusion, cough, coughing up blood, depression-new, fainting, fever, headaches-new, hearing problems, heart murmur, heart rhythm changes, night sweats, nosebleeds, shortness of breath, sleeping problems, sore throat, swelling of feet/legs, swollen lymph glands, thirst - excessive, urination - excessive, urination changes/pain, urine leakage, vision changes, and voice change.         All other systems were reviewed and were negative   Vital Signs:  Patient profile:   70 year old male Height:      70 inches Weight:      229 pounds BMI:     32.98 BSA:     2.21 Pulse rate:   88 / minute Pulse rhythm:   regular BP sitting:   136 / 84  (left arm) Cuff size:   regular  Vitals Entered By: Ok Anis CMA (May 31, 2010 8:56 AM)  Physical Exam  Additional Exam:  On physical exam he is a well-developed well-nourished male  skin: anicteric HEENT: normocephalic; PEERLA; no nasal or pharyngeal abnormalities neck: supple nodes: no cervical lymphadenopathy chest: clear to ausculatation and percussion heart: no murmurs, gallops, or rubs abd: soft, nontender; BS normoactive; no abdominal masses, tenderness, organomegaly rectal: deferred ext: no cynanosis, clubbing, edema skeletal: no deformities neuro: oriented x 3; no focal abnormalities    Impression & Recommendations:  Problem # 1:  ANGIODYSPLASIA OF INTESTINE WITH  HEMORRHAGE (ICD-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  Recommendations #1 followup Hemoccults; if positive I will proceed with colonoscopy  Problem # 2:  PERSONAL HX COLONIC POLYPS (ICD-V12.72) Patient  for followup colonoscopy 2012.  Should he be Hemoccult positive then I will do his colonoscopy in the very near future.  Problem # 3:  CAD, NATIVE VESSEL (ICD-414.01) Assessment: Comment Only  Problem # 4:  SLEEP APNEA (ICD-780.57) Assessment: Comment Only  Patient Instructions: 1)  Copy sent to : Gershon Crane, MD  Charlies Constable, MD  2)  You will go to the basment for hemoccult kit 3)  The medication list was reviewed and reconciled.  All changed / newly prescribed medications were explained.  A complete medication list was provided to the patient / caregiver.

## 2010-08-24 NOTE — Assessment & Plan Note (Signed)
Summary: ?sinus inf/cjr   Vital Signs:  Patient profile:   70 year old male O2 Sat:      93 % Pulse rate:   104 / minute BP sitting:   130 / 84  (left arm) Cuff size:   large  Vitals Entered By: Pura Spice, RN (August 14, 2010 3:17 PM) CC: sinus inf. onset x 1 wk ago    History of Present Illness: Here for 2 weeks of sinus pressure, PND, ST, HA, and a dry cough. No fever.   Allergies: 1)  ! * Zolpidem 2)  ! * Trazodone 3)  ! * Simivastatin 4)  ! * Shellfish 5)  Lopressor 6)  Lipitor  Past History:  Past Medical History: Reviewed history from 05/31/2010 and no changes required. chronic left foot pain, sees Dr. Lestine Box and Dr. Ethelene Hal  4. Previous tobacco use, now discontinued. 5. Chronic microcytic anemia thought to be related to chronic GI blood     loss from AV malformations.  RESTLESS LEG SYNDROME, HX OF (ICD-V12.49) HYPOTHYROIDISM (ICD-244.9) HEADACHE (ICD-784.0) OBSTRUCTIVE SLEEP APNEA (ICD-327.23) FATIGUE (ICD-780.79) HYPERLIPIDEMIA-MIXED (ICD-272.4) HYPERTENSION, BENIGN (ICD-401.1) CAD, NATIVE VESSEL (ICD-414.01) ACUTE SINUSITIS, UNSPECIFIED (ICD-461.9) 3 mm AVM in the proximal jejunum  Mild gastritis in the antrum BRONCHITIS, ACUTE (ICD-466.0) MYOCARDIAL INFARCTION, HX OF (ICD-412) CORONARY ARTERY DISEASE (ICD-414.00) LOW BACK PAIN (ICD-724.2) PEPTIC ULCER DISEASE (ICD-533.90) 2007 DIVERTICULOSIS GERD ADENOMATOUS  COLON POLYPS 2007  Review of Systems  The patient denies anorexia, fever, weight loss, weight gain, vision loss, decreased hearing, hoarseness, chest pain, syncope, dyspnea on exertion, peripheral edema, hemoptysis, abdominal pain, melena, hematochezia, severe indigestion/heartburn, hematuria, incontinence, genital sores, muscle weakness, suspicious skin lesions, transient blindness, difficulty walking, depression, unusual weight change, abnormal bleeding, enlarged lymph nodes, angioedema, breast masses, and testicular masses.    Physical  Exam  General:  Well-developed,well-nourished,in no acute distress; alert,appropriate and cooperative throughout examination Head:  Normocephalic and atraumatic without obvious abnormalities. No apparent alopecia or balding. Eyes:  No corneal or conjunctival inflammation noted. EOMI. Perrla. Funduscopic exam benign, without hemorrhages, exudates or papilledema. Vision grossly normal. Ears:  External ear exam shows no significant lesions or deformities.  Otoscopic examination reveals clear canals, tympanic membranes are intact bilaterally without bulging, retraction, inflammation or discharge. Hearing is grossly normal bilaterally. Nose:  External nasal examination shows no deformity or inflammation. Nasal mucosa are pink and moist without lesions or exudates. Mouth:  Oral mucosa and oropharynx without lesions or exudates.  Teeth in good repair. Neck:  No deformities, masses, or tenderness noted. Lungs:  Normal respiratory effort, chest expands symmetrically. Lungs are clear to auscultation, no crackles or wheezes.   Impression & Recommendations:  Problem # 1:  ACUTE SINUSITIS, UNSPECIFIED (ICD-461.9)  His updated medication list for this problem includes:    Augmentin 875-125 Mg Tabs (Amoxicillin-pot clavulanate) .[name]..[name] Two times a day  Complete Medication List: 1)  Vicodin 5-500 Mg Tabs (Hydrocodone-acetaminophen) .... 4 times a day as needed pain 2)  Protonix 40 Mg Tbec (Pantoprazole sodium) .[name].. 1 tab once daily 3)  Clonazepam 0.5 Mg Tabs (Clonazepam) .... Three times a day 4)  Aspirin 81 Mg Tbec (Aspirin) .... One by mouth every day 5)  Vitamin C 1000 Mg Tabs (Ascorbic acid) .[name].. 1 by mouth once daily 6)  Fish Oil Oil (Fish oil) .... Once daily 7)  Flexeril 10 Mg Tabs (Cyclobenzaprine hcl) .... Three times a day as needed spasm 8)  Voltaren 1 % Gel (Diclofenac sodium) .... Apply 4g to lower extremeties 4  times daily.  no more than 16g on any 1 afftected joint 9)  Ropinirole Hcl 2 Mg  Tabs (Ropinirole hcl) .... 2 tabs  at bedtime 10)  Percocet 10-650 Mg Tabs (Oxycodone-acetaminophen) .[name].. 1 q 6 hours as needed pain 11)  Amlodipine Besylate 5 Mg Tabs (Amlodipine besylate) .... Take one tablet by mouth daily 12)  Levothyroxine Sodium 137 Mcg Tabs (Levothyroxine sodium) .[name].. 1 tab once daily 13)  Multivitamins Tabs (Multiple vitamin) .[name].. 1 tab once daily 14)  Nitrostat 0.4 Mg Subl (Nitroglycerin) .[name].. 1 tablet under tongue at onset of chest pain; you may repeat every 5 minutes for up to 3 doses. 15)  Ferrous Sulfate 325 (65 Fe) Mg Tabs (Ferrous sulfate) .[name].. 1 tab two times a day 16)  Calcium 500 Mg Tabs (Calcium) .[name].. 1 tab two times a day 17)  Stool Softener 250 Mg Caps (Docusate sodium) .... As needed 18)  Halobetasol Propionate 0.05 % Crea (Halobetasol propionate) .... Apply three times a day as needed for eczema 19)  Augmentin 875-125 Mg Tabs (Amoxicillin-pot clavulanate) .... Two times a day  Patient Instructions: 1)  Please schedule a follow-up appointment as needed .  Prescriptions: AUGMENTIN 875-125 MG TABS (AMOXICILLIN-POT CLAVULANATE) two times a day  #20 x 0   Entered and Authorized by:   [name] MD   Signed by:   [name] MD on 08/14/2010   Method used:   Electronically to        [facility]  [phone]* (retail)       [address]       [place], [place]  [address]       Ph: [phone] or [phone]       Fax: [phone]   RxID:   [id number]    Orders Added: 1)  Est. Patient Level IV [36644]

## 2010-09-14 ENCOUNTER — Telehealth: Payer: Self-pay | Admitting: Family Medicine

## 2010-09-14 MED ORDER — AMOXICILLIN-POT CLAVULANATE 875-125 MG PO TABS: 1.0000 | ORAL_TABLET | Freq: Two times a day (BID) | ORAL | Status: AC

## 2010-09-14 NOTE — Telephone Encounter (Signed)
Pt has finshed with antibiotics for sinus inf, but is still sick. Pt is req a refill of Amoxicillin (?dose). Pls [facility] in to [facility] [place].

## 2010-09-14 NOTE — Telephone Encounter (Signed)
Done

## 2010-09-20 ENCOUNTER — Other Ambulatory Visit: Payer: Self-pay | Admitting: Family Medicine

## 2010-09-28 ENCOUNTER — Other Ambulatory Visit: Payer: Self-pay | Admitting: *Deleted

## 2010-09-28 NOTE — Telephone Encounter (Signed)
patient  Is requesting a refill of hydrocodone 5/500.

## 2010-09-29 MED ORDER — HYDROCODONE-ACETAMINOPHEN 5-500 MG PO TABS: 1.0000 | ORAL_TABLET | Freq: Four times a day (QID) | ORAL | Status: AC

## 2010-09-29 NOTE — Telephone Encounter (Signed)
To take q 6 hours prn, call in #120 with 5 rf

## 2010-10-04 LAB — GLUCOSE, CAPILLARY
Glucose-Capillary: 102 mg/dL — ABNORMAL HIGH (ref 70–99)
Glucose-Capillary: 132 mg/dL — ABNORMAL HIGH (ref 70–99)
Glucose-Capillary: 140 mg/dL — ABNORMAL HIGH (ref 70–99)
Glucose-Capillary: 145 mg/dL — ABNORMAL HIGH (ref 70–99)

## 2010-10-04 LAB — CK TOTAL AND CKMB (NOT AT ARMC)
CK, MB: 1.5 ng/mL (ref 0.3–4.0)
CK, MB: 7.2 ng/mL (ref 0.3–4.0)
Relative Index: 5.9 — ABNORMAL HIGH (ref 0.0–2.5)
Relative Index: INVALID (ref 0.0–2.5)
Total CK: 129 U/L (ref 7–232)
Total CK: 58 U/L (ref 7–232)

## 2010-10-04 LAB — DIFFERENTIAL
Basophils Absolute: 0.1 10*3/uL (ref 0.0–0.1)
Eosinophils Absolute: 0.5 10*3/uL (ref 0.0–0.7)
Eosinophils Relative: 5 % (ref 0–5)
Lymphocytes Relative: 26 % (ref 12–46)
Monocytes Absolute: 1 10*3/uL (ref 0.1–1.0)

## 2010-10-04 LAB — CROSSMATCH

## 2010-10-04 LAB — ABO/RH: ABO/RH(D): O POS

## 2010-10-04 LAB — IRON AND TIBC: UIBC: >450 ug/dL

## 2010-10-04 LAB — CBC
HCT: 26.3 % — ABNORMAL LOW (ref 39.0–52.0)
HCT: 31.8 % — ABNORMAL LOW (ref 39.0–52.0)
HCT: 34.6 % — ABNORMAL LOW (ref 39.0–52.0)
Hemoglobin: 7.8 g/dL — ABNORMAL LOW (ref 13.0–17.0)
Hemoglobin: 9.7 g/dL — ABNORMAL LOW (ref 13.0–17.0)
MCH: 21.6 pg — ABNORMAL LOW (ref 26.0–34.0)
MCV: 69.4 fL — ABNORMAL LOW (ref 78.0–100.0)
MCV: 70.2 fL — ABNORMAL LOW (ref 78.0–100.0)
RBC: 4.11 MIL/uL — ABNORMAL LOW (ref 4.22–5.81)
RBC: 4.53 MIL/uL (ref 4.22–5.81)
RDW: 24.3 % — ABNORMAL HIGH (ref 11.5–15.5)
WBC: 11.1 10*3/uL — ABNORMAL HIGH (ref 4.0–10.5)
WBC: 11.2 10*3/uL — ABNORMAL HIGH (ref 4.0–10.5)
WBC: 9.6 10*3/uL (ref 4.0–10.5)

## 2010-10-04 LAB — COMPREHENSIVE METABOLIC PANEL
ALT: 21 U/L (ref 0–53)
AST: 25 U/L (ref 0–37)
Alkaline Phosphatase: 53 U/L (ref 39–117)
CO2: 27 mEq/L (ref 19–32)
Calcium: 8.7 mg/dL (ref 8.4–10.5)
Chloride: 107 mEq/L (ref 96–112)
GFR calc Af Amer: >60 mL/min (ref >60)
GFR calc non Af Amer: >60 mL/min (ref >60)
Glucose, Bld: 111 mg/dL — ABNORMAL HIGH (ref 70–99)
Potassium: 4 mEq/L (ref 3.5–5.1)
Sodium: 136 mEq/L (ref 135–145)
Total Bilirubin: 0.6 mg/dL (ref 0.3–1.2)

## 2010-10-04 LAB — BASIC METABOLIC PANEL
BUN: 13 mg/dL (ref 6–23)
CO2: 28 mEq/L (ref 19–32)
Chloride: 105 mEq/L (ref 96–112)
Chloride: 105 mEq/L (ref 96–112)
GFR calc non Af Amer: >60 mL/min (ref >60)
Potassium: 4 mEq/L (ref 3.5–5.1)
Potassium: 4.1 mEq/L (ref 3.5–5.1)
Sodium: 138 mEq/L (ref 135–145)

## 2010-10-04 LAB — HEMOGLOBIN AND HEMATOCRIT, BLOOD
HCT: 30.4 % — ABNORMAL LOW (ref 39.0–52.0)
Hemoglobin: 9.3 g/dL — ABNORMAL LOW (ref 13.0–17.0)

## 2010-10-04 LAB — TROPONIN I: Troponin I: 0.01 ng/mL (ref 0.00–0.06)

## 2010-10-06 ENCOUNTER — Other Ambulatory Visit: Payer: Self-pay | Admitting: Family Medicine

## 2010-10-06 NOTE — Telephone Encounter (Signed)
Pt need new rx onxycodone 10-650mg  #120. Pt is aware doc out of office.

## 2010-10-11 LAB — COMPREHENSIVE METABOLIC PANEL
ALT: 29 U/L (ref 0–53)
Albumin: 4 g/dL (ref 3.5–5.2)
Alkaline Phosphatase: 53 U/L (ref 39–117)
Potassium: 3.7 mEq/L (ref 3.5–5.1)
Sodium: 134 mEq/L — ABNORMAL LOW (ref 135–145)
Total Protein: 7 g/dL (ref 6.0–8.3)

## 2010-10-11 LAB — CARDIAC PANEL(CRET KIN+CKTOT+MB+TROPI)
CK, MB: 3 ng/mL (ref 0.3–4.0)
CK, MB: 3.3 ng/mL (ref 0.3–4.0)
CK, MB: 3.5 ng/mL (ref 0.3–4.0)
Relative Index: INVALID (ref 0.0–2.5)
Relative Index: INVALID (ref 0.0–2.5)
Total CK: 80 U/L (ref 7–232)
Total CK: 87 U/L (ref 7–232)
Total CK: 96 U/L (ref 7–232)
Troponin I: 0.02 ng/mL (ref 0.00–0.06)
Troponin I: 0.02 ng/mL (ref 0.00–0.06)
Troponin I: 0.02 ng/mL (ref 0.00–0.06)

## 2010-10-11 LAB — DIFFERENTIAL
Basophils Relative: 1 % (ref 0–1)
Eosinophils Absolute: 0.4 10*3/uL (ref 0.0–0.7)
Eosinophils Relative: 5 % (ref 0–5)
Monocytes Absolute: 0.8 10*3/uL (ref 0.1–1.0)
Monocytes Relative: 8 % (ref 3–12)

## 2010-10-11 LAB — CBC
Hemoglobin: 14 g/dL (ref 13.0–17.0)
Platelets: 287 10*3/uL (ref 150–400)
RDW: 15.2 % (ref 11.5–15.5)
WBC: 9.9 10*3/uL (ref 4.0–10.5)

## 2010-10-11 LAB — LIPID PANEL
Cholesterol: 177 mg/dL (ref 0–200)
LDL Cholesterol: 108 mg/dL — ABNORMAL HIGH (ref 0–99)

## 2010-10-12 MED ORDER — OXYCODONE-ACETAMINOPHEN 10-650 MG PO TABS: 1.0000 | ORAL_TABLET | Freq: Four times a day (QID) | ORAL | Status: AC | PRN

## 2010-10-12 NOTE — Telephone Encounter (Signed)
done

## 2010-10-12 NOTE — Telephone Encounter (Signed)
Left mess on cell phone rx ready for pick up  

## 2010-11-27 ENCOUNTER — Telehealth: Payer: Self-pay | Admitting: *Deleted

## 2010-11-27 ENCOUNTER — Other Ambulatory Visit: Payer: Self-pay | Admitting: *Deleted

## 2010-11-27 MED ORDER — DICLOFENAC SODIUM 1 % TD GEL: 1.0000 "application " | Freq: Four times a day (QID) | TRANSDERMAL | Status: DC

## 2010-11-27 MED ORDER — AMLODIPINE BESYLATE 5 MG PO TABS: 5.0000 mg | ORAL_TABLET | Freq: Every day | ORAL | Status: DC

## 2010-11-27 NOTE — Telephone Encounter (Signed)
Rx Done . 

## 2010-12-05 ENCOUNTER — Other Ambulatory Visit: Payer: Self-pay | Admitting: Family Medicine

## 2010-12-05 NOTE — Procedures (Signed)
NAMECONNIE, [name]              ACCOUNT NO.:  [id number]   MEDICAL RECORD NO.:  [id number]          PATIENT TYPE:  OUT   LOCATION:  SLEEP CENTER                 FACILITY:  [facility]   PHYSICIAN:  Barbaraann Share, MD,FCCPDATE OF BIRTH:  1940/11/18   DATE OF STUDY:  10/27/2008                            NOCTURNAL POLYSOMNOGRAM   REFERRING PHYSICIAN:  Everardo Beals. Juanda Chance, MD, [facility]   LOCATION:  Sleep Lab.   REFERRING PHYSICIAN:  Everardo Beals. Juanda Chance, MD, [facility]   DATE OF STUDY:  October 27, 2008.   INDICATION FOR STUDY:  Hypersomnia with sleep apnea.   EPWORTH SLEEPINESS SCORE:  17.   MEDICATIONS:   SLEEP ARCHITECTURE:  The patient had a total sleep time of 235 minutes  with no slow wave sleep and only 40 minutes of REM.  Sleep onset latency  was normal at 3 minutes, and REM did not occur until the titration  portion of the study.  Sleep efficiency was excellent at 96%.   RESPIRATORY DATA:  The patient underwent a split night protocol where he  was found to have 33 obstructive events in the first 126 minutes of  sleep.  This gave him an AHI of 16 events per hour during the diagnostic  portion of the study.  Events were not positional.  There was moderate  to loud snoring noted throughout.  By protocol, the patient was then  fitted with a medium ResMed Quattro full-face mask, and CPAP titration  was initiated.  The patient was titrated as high as 16 cm of water;  however, he appears to have optimal therapeutic benefit around 12-13 cm.   OXYGEN DATA:  There was transient O2 desaturation as low as 81% with the  patient's obstructive events.   CARDIAC DATA:  Occasional PVC noted, but no clinically significant  arrhythmias seen.   MOVEMENT-PARASOMNIA:  The patient had no significant leg jerks or  abnormal behavior seen.   IMPRESSIONS-RECOMMENDATIONS:  1. Split night study reveals mild obstructive sleep apnea with an      apnea-hypopnea index of 16 events per hour during the diagnostic  portion of the study, and oxygen desaturation as low as 81%.  The      patient was then placed on continuous positive airway pressure with      a medium Quattro full-face mask, and ultimately found to have an      optimal pressure of 12-13 cm.  I would initiate the patient on 12      cm.  He should also be encouraged to work aggressively on weight      loss.  It should also be noted, given the patient's mild sleep      apnea, that he may also benefit from alternative therapies.  These      can include weight loss alone, upper airway surgery, as well as      a dental appliance.  Clinical correlation is suggested.  2. Occasional premature ventricular contraction, but no clinically      significant arrhythmias.      Barbaraann Share, MD,FCCP  Diplomate, American Board of Sleep  Medicine  Electronically Signed     KMC/MEDQ  D:  11/09/2008 16:16:01  T:  11/10/2008 04:54:09  Job:  [id number]

## 2010-12-05 NOTE — Telephone Encounter (Signed)
Pt req refill of Oxycodone 10-650 mg.

## 2010-12-05 NOTE — Consult Note (Signed)
NAMEBERNIE, [name]              ACCOUNT NO.:  [id number]   MEDICAL RECORD NO.:  [id number]          PATIENT TYPE:  INP   LOCATION:  3729                         FACILITY:  [facility]   PHYSICIAN:  Everardo Beals. Juanda Chance, MD, FACCDATE OF BIRTH:  21-May-1941   DATE OF CONSULTATION:  09/03/2007  DATE OF DISCHARGE:                                 CONSULTATION   PRIMARY CARE PHYSICIAN:  Tera Mater. Clent Ridges, M.D.   REQUESTING PHYSICIAN:  Tera Mater. Clent Ridges, M.D.   CARDIOLOGIST:  Noralyn Pick. Eden Emms, MD, [facility].   CHIEF COMPLAINT:  Chest pain.   CLINICAL HISTORY:  Mr. [name] is 70 years old and has a history of  nonobstructive disease diagnosed at catheterization in December 2006.  He woke up about 3 a.m. this morning with substernal chest pain  radiating to his left arm and jaw.  His symptoms persisted and he  finally called 911 and was brought to the emergency room by ambulance.  He refused nitroglycerin  because it had given him headaches in the  past.  He finally got relief with IV morphine after several hours of  pain in the ED.  He had no associated symptoms of shortness of breath or  diaphoresis.   PAST MEDICAL HISTORY:  1. Hyperlipidemia, though he has not been on any therapy for this.  2. He also had a recent ankle injury.  He had surgery on his left      ankle done just yesterday where they took out some scar tissue and      manipulated some tendons.  3. He also has a history of chronic back pain.  4. He also has a history of an abnormal ECG with atrial fibrillation      in December 2008.   CURRENT MEDICATIONS:  Hydrocodone for his back and foot, Prevacid, and  Synthroid.   SOCIAL HISTORY:  He lives in [place] with his 3rd wife.  He works as  a Naval architect.  He has a long term smoker and currently smokes about a  half pack of cigarettes a day.   FAMILY HISTORY:  Positive for coronary disease with a father who had  multiple myocardial infarctions.   REVIEW OF SYSTEMS:  Positive for  nausea.  He did have some diaphoresis  earlier today.  Review of systems is also positive for his foot pain and  back pain.   PHYSICAL EXAMINATION:  VITAL SIGNS:  Blood pressure 111/58, pulse 87 and  regular.  NECK:  There was no venous distention.  The carotid pulses were full  without bruits.  CHEST:  Clear without rales or rhonchi.  HEART:  Rhythm is regular.  I hear no murmurs or gallops.  ABDOMEN:  Soft with normal bowel sounds.  There is no  hepatosplenomegaly.  EXTREMITIES:  Peripheral pulses are full and there is no peripheral  edema.  There was a brace on his left ankle.  MUSCULOSKELETAL:  Showed no deformities.  SKIN:  Warm and dry.  NEUROLOGIC:  Showed no focal neurological signs.   INITIAL LABORATORY DATA:  Showed BUN 16, creatinine 0.8.  Hemoglobin  10.3.  MB 1.9 and troponin less than 0.05 by point-of-care markers.  His  electrocardiogram was normal.   IMPRESSION:  1. Chest pain consistent with unstable angina.  2. History of nonobstructive disease at catheterization in 2006.  3. Current smoker.  4. Hypertension.  5. Positive family history of coronary artery disease.  6. History of atrial fibrillation by electrocardiogram in December      2008.  7. Recent ankle surgery.   RECOMMENDATIONS:  1. I agree with the initial orders by internal medicine.  2. I would recommend that we add Plavix 300 mg load and 75 mg a day      and also add the beta-blocker, Lopressor 25 mg b.i.d.  3. We will plan further evaluation with catheterization tomorrow.      Bruce Elvera Lennox Juanda Chance, MD, [facility]  Electronically Signed     BRB/MEDQ  D:  09/03/2007  T:  09/04/2007  Job:  [id number]   cc:   Jeannett Senior A. Clent Ridges, MD  Noralyn Pick Eden Emms, MD, [facility]

## 2010-12-05 NOTE — Assessment & Plan Note (Signed)
[facility]                            CARDIOLOGY OFFICE NOTE   NAME:[name], Christopher BOROWIAK                     MRN:          [id number]  DATE:10/15/2007                            DOB:          1940-11-02    PRIMARY CARE PHYSICIAN:  Tera Mater. Clent Ridges, M.D.   CLINICAL HISTORY:  The patient is 70 years old and was admitted in early  February with chest pain and positive troponins consistent with a non-ST  elevation myocardial infarction.  Surprisingly at catheterization, he  had nonobstructive disease.  We felt he did have a true acute coronary  syndrome and probably had plaque rupture which we could not identify  with distal embolization.   He was enrolled in the Tracer trial but was not started on Tracer drug  because of realization of a history of AV malformations and chronic GI  blood loss.   He has done well since discharge and has had no recurrent symptoms.   He had an ankle injury and then surgery shortly before his non-ST  elevation myocardial infarction which may have been related and he is  currently on Worker's Comp for this.   PAST MEDICAL HISTORY:  Significant for hyperlipidemia, GERD, restless  leg syndrome, hypertension, tobacco use.   CURRENT MEDICATIONS:  1. Levothyroxine.  2. Protonix.  3. Iron b.i.d.  4. Aspirin 81 mg.  5. Lipitor 80 mg.  6. Metoprolol 25 mg b.i.d.  7. Mirapex.  8. Clonazepam.   PHYSICAL EXAMINATION:  VITAL SIGNS:  Blood pressure 133/82, pulse 55 and  regular.  There was no venous distention.  Carotid pulses were full  without bruits.  CHEST:  Clear.  HEART:  Rhythm is regular.  There are no murmurs.  ABDOMEN:  Soft with normal bowel sounds.  There is no  hepatosplenomegaly.  Peripheral pulses are full with no peripheral  edema.   IMPRESSION:  1. Recent non-ST elevation myocardial infarction with nonobstructive      disease at catheterization.  2. Hyperlipidemia.  3. Hypertension.  4. Previous tobacco  use, now discontinued.  5. Chronic microcytic anemia thought to be related to chronic GI blood      loss from AV malformations.   RECOMMENDATIONS:  I think the patient is doing well.  His GI problem is  stable now and his last hemoglobin was 12.1.  I think he may benefit  from Tracer study drug and I think the risks of bleeding are small.  I  explained to him that there would be a 50% chance that he would be on  drug and he is quite agreeable to proceeding.  We decided not to put him  on Plavix.  We will plan to institute Tracer study drug.  We will get a  lipid profile in a month and I will see him back in a year.  He will be  seen by our study coordinators in the interim as part of the Tracer  Trial.     Bruce R. Juanda Chance, MD, [facility]  Electronically Signed    BRB/MedQ  DD: 10/15/2007  DT: 10/15/2007  Job #: [id number]

## 2010-12-05 NOTE — Discharge Summary (Signed)
[name]              ACCOUNT NO.:  [id number]   MEDICAL RECORD NO.:  [id number]          PATIENT TYPE:  INP   LOCATION:  3729                         FACILITY:  [facility]   PHYSICIAN:  Willow Ora, MD           DATE OF BIRTH:  11/02/1940   DATE OF ADMISSION:  09/03/2007  DATE OF DISCHARGE:  09/05/2007                               DISCHARGE SUMMARY   DISCHARGE DIAGNOSES:  1. Non-ST elevation myocardial infarction, status post cardiac      catheterization September 04, 2007, per Dr. Charlies Constable, positive      for nonobstructive coronary disease.  2. Microcytic anemia.  3. Leukocytosis.  4. Dyslipidemia  5. Gastroesophageal reflux disease.  6. Restless legs syndrome.  7. Hypertension.  8. Tobacco abuse.  9. Hyperglycemia with normal hemoglobin A1c.  Will need outpatient      follow-up.  10.Hypothyroid with normal TSH on Synthroid.   HISTORY OF PRESENT ILLNESS:  [name] is a 70 year old male who was  admitted on September 03, 2007, with chief complaint of chest pain.  He  is recently status post left ankle repair secondary to injury on  September 02, 2007.  He has a known history of nonobstructive coronary  disease per cardiac catheterization performed December 2006.  He a woke  on the morning of admission at 3 a.m. with chest pain which was  substernal and became severe with a 10/10 rating.  The patient's wife  called 9-1-1.  He was admitted for further evaluation and treatment.   COURSE OF HOSPITALIZATION:  Problem 1.  NON-ST ELEVATION MYOCARDIAL INFARCTION:  The patient was  admitted and underwent serial cardiac enzymes.  He was noted to have  elevated troponins as high as 0.24.  He had a leukocytosis on admission  of 18,000 but it is not clear the etiology of this besides the  possibility of reactive to underlying cardiac event.  His white blood  cell count on follow up was 15,000.  This will need to be repeated as an  outpatient.  At this time we plan per discussion  with Dr. Charlies Constable  to continue aspirin 81 mg p.o. daily as well as beta blocker, which was  started during this admission.   Problem 2.  HEME-POSITIVE STOOL/IRON DEFICIENCY ANEMIA:  The patient was  noted to be fecal occult blood-positive.  His hemoglobin at time of  discharge is 9.4.  Upon review of old laboratory values, this is the  patient's baseline.  He was seen by [facility] initially by Jennye Moccasin, PA-C.  We are waiting final input from Dr. Marina Goodell.  The patient  has a known history of small bowel AVMs and it is suspected that he has  others which he may be bleeding from and hence has depleted iron stores.  Await final input from Dr. Marina Goodell in regards to needed follow-up.  The  patient's iron level was 13, and therefore we will initiate oral iron at  time of discharge.   MEDICATIONS AT TIME OF DISCHARGE:  1. Protonix 40 mg p.o. b.i.d.  2.  Levothyroxine 100 mcg p.o. daily.  3. Requip 1 mg p.o. daily at bedtime.  4. Aspirin 81 mg p.o. daily.  5. Lipitor 80 mg p.o. daily.  6. Metoprolol 25 mg p.o. b.i.d.  7. Percocet 10/325 mg 1 tablet p.o. four times daily as needed.  8. Clonazepam 0.5 mg 1 tablet p.o. t.i.d.  9. Mirapex 0.5 mg p.o. daily at bedtime.  10.Iron 325 mg p.o. b.i.d.   PERTINENT LABORATORIES AT TIME OF DISCHARGE:  Hemoglobin 9.4, hematocrit  30.1, white blood cell count 15.2, platelets 477.  BUN 19, creatinine  1.1.   DISPOSITION:  The patient will be discharged to home.   FOLLOW UP:  He is instructed to follow up with Dr. Gershon Crane in 1-2  weeks and contact the office for an appointment.  He is also instructed  to return to the ER should he develop recurrent chest pain.      Sandford Craze, NP      Willow Ora, MD  Electronically Signed    MO/MEDQ  D:  09/05/2007  T:  09/07/2007  Job:  [id number]   cc:   Tera Mater. Clent Ridges, MD

## 2010-12-05 NOTE — Cardiovascular Report (Signed)
NAMEGERBER, PENZA              ACCOUNT NO.:  [id number]   MEDICAL RECORD NO.:  [id number]          PATIENT TYPE:  INP   LOCATION:  3729                         FACILITY:  [facility]   PHYSICIAN:  Everardo Beals. Juanda Chance, MD, FACCDATE OF BIRTH:  07/29/1940   DATE OF PROCEDURE:  DATE OF DISCHARGE:  09/05/2007                            CARDIAC CATHETERIZATION   CLINICAL HISTORY:  Mr. Tamargo is 70 years old and has a history of  nonobstructive coronary artery disease by cath 3 years ago.  He had  orthoscopic ankle surgery done the day prior to admission and the next  morning developed severe chest pain.  He came to the emergency room by  EMS.  His ECG was normal, but his markers returned positive consistent  with a non-ST-elevation infarction.  He also has hyperlipidemia.   He was enrolled in the tracer trial, but because of microcytic anemia  and a history of previous GI bleed, a decision was made not to load him  with a tracer study drug until he was evaluated further.   PROCEDURE:  The procedure was performed via the right femoral arteries  and arterial sheath and 5-French preformed coronary catheters.  A front  wall arterial puncture was performed and Omnipaque contrast was used.  The patient tolerated the procedure well and left the laboratory in  satisfactory condition.   RESULTS:  Left main coronary artery:  The left main coronary artery was  free of significant disease.   Left anterior descending artery:  The left anterior descending artery  gave rise to 4 diagonal branches and 2 septal perforators.  The LAD was  irregular and there was 30% proximal and 50% mid stenosis.   Circumflex artery:  The circumflex artery gave rise to a ramus branch,  marginal branch and posterolateral branch.  These vessels were  irregular, but there was no significant obstruction.   Right coronary artery:  The right coronary artery was a moderate-sized  vessel and gave rise to a right ventricular branch,  atrial branch, a  posterior descending branch and 4 posterolateral branches.  There was  37% proximal and 40% mid stenosis and irregularities in the proximal,  mid and distal vessel.   Left ventriculogram:  The left ventriculogram performed in the RAO  projection showed slight hypokinesis of the inferobasal wall.  The  overall wall motion was good with an estimated ejection fraction of 55%.   The aortic pressure was 124/76 with mean of 98, left neck pressure was  124/24.   CONCLUSION:  Nonobstructive coronary artery disease with 30% proximal  and 50% mid stenosis in the left anterior descending, no significant  obstruction of circumflex artery, 30% proximal, 40% mid stenosis in the  right coronary artery with slight inferobasal wall hypokinesis and  estimated ejection fraction of 55%.   RECOMMENDATIONS:  The patient presented with chest pain and positive  markers consistent with a non-ST-elevation infarction, but has  nonobstructive coronary disease.  His symptoms and markers are  convincing that this was a true acute coronary syndrome.  The mechanism  may have been related to plaque rupture and distal embolization,  although there is no obvious ruptured plaque visible on angiography.  Another alternative is possible spasm.  I will plan to treat him as an  acute coronary syndrome.  Because of his microcytic anemia and history  of GI  bleeding, I will not used Plavix initially, and will hold off on his  tracer study drug into his further GI evaluation.  We will plan GI  consult tomorrow with probable discharge after that with plans for an  outpatient GI workup and then we can decide about treatment with Plavix  and/or tracer study drug.      Bruce Elvera Lennox Juanda Chance, MD, [facility]  Electronically Signed     BRB/MEDQ  D:  09/04/2007  T:  09/06/2007  Job:  [id number]   cc:   Jeannett Senior A. Clent Ridges, MD  Noralyn Pick Eden Emms, MD, [facility]  Bruce R. Juanda Chance, MD, [facility]  CP Lab

## 2010-12-05 NOTE — Op Note (Signed)
NAMEBRADY, [name]              ACCOUNT NO.:  [id number]   MEDICAL RECORD NO.:  [id number]          PATIENT TYPE:  AMB   LOCATION:  DSC                          FACILITY:  [facility]   PHYSICIAN:  Leonides Grills, M.D.     DATE OF BIRTH:  1940/08/17   DATE OF PROCEDURE:  09/02/2007  DATE OF DISCHARGE:                               OPERATIVE REPORT   PREOPERATIVE DIAGNOSIS:  Left anterior ankle impingement.   POSTOPERATIVE DIAGNOSIS:  Left anterior ankle impingement.   OPERATION:  Left ankle arthroscopy with extensive debridement.   ANESTHESIA:  General.   SURGEON:  Leonides Grills, M.D.   ASSISTANT:  Richardean Canal, P.A.-C.   ESTIMATED BLOOD LOSS:  Minimal.   TOURNIQUET TIME:  None.   COMPLICATIONS:  None.   DISPOSITION:  Stable to the PR.   INDICATIONS:  This is a 70 year old male who has had long standing left  anterior ankle pain related to a work injury that was interfering with  his life to the point that he could not do what he wanted to do.  He was  consented to the above procedure.  All risks including infection,  neurovascular injury, persistent pain, worsening pain, prolonged  recovery, stiffness, arthritis, were all explained and questions  encouraged and answered.   OPERATION:  The patient was taken to the operating room and placed in  the supine position. After adequate general endotracheal anesthesia was  administered as well as Ancef gram IV piggyback, the patient was placed  in a sloppy lateral position with the operative side up on a beanbag.  All bony prominences were well padded.  The left lower extremity is  prepped and draped in a sterile manner.  No tourniquet was used.  Anatomical landmarks including the anterior tibialis tendon and peroneus  tertius tendon and superficial peroneal nerve were mapped out.  A spinal  needle was placed just medial to the anterior tibialis tendon. 20 mL  normal saline was instilled in the ankle.  Weston Brass and spread technique  was  then utilized to create the anteromedial portal.  A blunt tip trocar  with cannula followed by camera was placed in the ankle and under direct  visualization, the anterolateral portal was created with a spinal needle  followed by nick and spread technique, avoiding the peroneus tertius and  superficial peroneal nerve.  There was a tremendous amount of synovitis  over the entire anterior aspect of the ankle.  This was then extensively  debrided with a radiofrequency bevel and a barracuda shaver.  There was  an anterior distal lateral tibial spur that was impinging, as well, and  this was also removed with a synovectomy rongeur.  Accessory tib-fib  ligament was also impinging anterolaterally and this was also debrided,  as well, with the radiofrequency bevel and shaver.  There was no true  osteochondral lesion at this time that we could see and the lateral  gutter also had inflammatory changes and this was also debrided.  We  then placed the camera anterolaterally visualizing anteromedially and,  again, there was a large amount of synovitis and a flap  of synovium, as  well.  This could have been a remnant of prior avulsion of capsule.  This was also debrided back into the medial gutter. There was also some  inflammatory changes inferior to the deep deltoid ligament and was  rubbing and inflamed in this area with surrounding synovitis, as well.  Range of motion of the ankle was performed and there was no impinging  area, especially anteromedially or laterally.  Pictures were obtained  throughout the procedure.  The camera was removed and the wound was  closed with 4-0 nylon stitch.  A sterile dressing was applied.  A Cam  walker boot was applied.  The patient was stable to the PR.      Leonides Grills, M.D.  Electronically Signed     PB/MEDQ  D:  09/02/2007  T:  09/03/2007  Job:  [id number]

## 2010-12-05 NOTE — Procedures (Signed)
[facility]                              EXERCISE TREADMILL   NAME:[name], [name]                     MRN:          [id number]  DATE:03/22/2008                            DOB:          1941/02/10    REPORT TITLE:  Exercise treadmill report.   DURATION OF EXERCISE:  Six minutes.   MAXIMUM HEART RATE:  117.   There was no chest pain.   COMMENT:  [name] was able to exercise 6 minutes with the Bruce  protocol and achieved a heart rate of 117, at which time the test was  terminated.  Resting blood pressure was 114/72 and this increased to  172/83 at peak exercise.  The postexercise ECG showed no significant ST-  segment changes.  The patient did not reach 85% of target heart rate.  This was interpreted as a nondiagnostic exercise test due to somewhat  limited exercise tolerance and inability to achieve a target heart rate,  but there was no evidence of ischemia.  The patient had been off of  metoprolol for 2 days prior to the test.   CARDIOLOGY OFFICE NOTE:  [name] returned for followup visit and treadmill test today.  In  February 2009, after he had an ankle injury and surgery, he suffered a  non-ST-elevation myocardial infarction.  He underwent catheterization  and was found to have nonobstructive disease and we elected medical  therapy.  His LV function was good.   He was involved in a rehab program associated with workers' comp for  about 3 months.  He still feels like he has not gotten back to his  previous level of fitness.  He says that he gives out fairly easily.  He  has not had much chest pain.   PAST MEDICAL HISTORY:  Significant for hyperlipidemia, GERD, restless  leg syndrome, hypertension, and tobacco use.   CURRENT MEDICATIONS:  Thyroid, Protonix, iron, aspirin, metoprolol,  Mirapex, simvastatin, and tracer study drug.   PHYSICAL EXAMINATION:  VITAL SIGNS:  The blood pressure was 114/72 and  the pulse 77 and  regular.  NECK:  There was no vein distension.  The carotid pulses were full  without bruits.  CHEST:  Clear.  HEART:  Rhythm is regular.  No murmurs or gallops.  EXTREMITIES:  Peripheral pulses are full and no peripheral edema.   IMPRESSION:  1. Coronary artery disease status post non-ST-elevation myocardial      infarction in February 2009 with nonobstructive disease at      catheterization.  2. Good left ventricular function.  3. Hyperlipidemia.  4. Hypertension.  5. Previous tobacco use, now discontinued.  6. Chronic anemia.   RECOMMENDATIONS:  [name] has had treadmill test today, is  nondiagnostic due to decreased exercise time, but there is no evidence  of ischemia.  At this point, I do not think his cardiac problem limits  his activities.  I suspect most of his lab activities are limited by  relative deconditioning.  We will plan to get some blood test to make  sure there is no systemic problems such as anemia  or hypothyroidism  responsible for his decreased exercise tolerance.  I told him that I  thought his heart was okay and that he could try and continue to pursue  rehabilitation to improve his fitness.  His current fitness levels  probably not enough to be able to do his job, but he may be able to  achieve that with some more rehab.  I will plan to see him back for  cardiac followup in 6 months.     Bruce Christopher Lennox Juanda Chance, MD, [facility]  Electronically Signed    BRB/MedQ  DD: 03/22/2008  DT: 03/23/2008  Job #: [id number]

## 2010-12-06 MED ORDER — OXYCODONE-ACETAMINOPHEN 10-650 MG PO TABS: 1.0000 | ORAL_TABLET | Freq: Four times a day (QID) | ORAL | Status: AC | PRN

## 2010-12-06 NOTE — Telephone Encounter (Signed)
Pt called to check on status of Oxycodone refill. Pt would like to pick this up today.

## 2010-12-06 NOTE — Telephone Encounter (Signed)
rx ready for pick up and patient is aware  

## 2010-12-06 NOTE — Telephone Encounter (Signed)
done

## 2010-12-08 NOTE — Discharge Summary (Signed)
NAMEZAKARIYAH, FREIMARK              ACCOUNT NO.:  [id number]   MEDICAL RECORD NO.:  [id number]          PATIENT TYPE:  INP   LOCATION:  2023                         FACILITY:  [facility]   PHYSICIAN:  Charlton Haws, M.D.     DATE OF BIRTH:  04-14-1941   DATE OF ADMISSION:  07/12/2005  DATE OF DISCHARGE:  07/13/2005                                 DISCHARGE SUMMARY   PRIMARY CARE PHYSICIAN:  Jeannett Senior A. Clent Ridges, M.D. [facility]   ALLERGIES:  HE HAS ALLERGY POSSIBLY TO LOPRESSOR WHICH MAY GIVE A RASH.  THE  PATIENT ALSO HAS AN ALLERGY TO SHRIMP.   PRINCIPAL DIAGNOSIS:  Admitted July 12, 2005 with right groin pain which  radiated down to the leg and up into the back, now resolved.  The patient  had taken ibuprofen the last 16 hours.   SECONDARY DIAGNOSES:  1.  History of severe chest pain, onset July 04, 2005, [place], [place], also radiating to the left arm, producing nausea, presyncope,      cold sweats, heart felt as if it were beating out of his chest.  Pain      resolved in the emergency room at [facility] after initiation of      nitroglycerin.  2.  Consult with Dr. Charlton Haws July 10, 2005 with symptoms worrisome      for unstable angina, with family history of coronary artery disease and      previous smoking in this patient.  3.  Status post left heart catheterization July 11, 2005.  4.  History of tobacco habituation, quit three weeks ago.  5.  Family history of coronary artery disease.   PROCEDURES:  1.  On July 11, 2005, left heart catheterization.  Study showed an      ejection fraction of 60%.  The LAD had 40% multiple discrete lesions      proximal and mid.  The diagonal branch was small, no disease.  The left      circumflex free of significant disease.  Right coronary artery with 40%      multiple discrete lesions, multiple proximal and distal.  This patient      did have Angio-Seal of the right femoral artery before discharge (this      may  be the focus of the patient's right groin tenderness, with chaffing      of the Angio-Seal against the femoral nerve.  If chest pain recurs, a      chest CT to rule out other pathology recommended.  2.  Ultrasound of the right groin on July 12, 2005.  This study showed      no evidence of a pseudoaneurysm or AV fistula.   HISTORY OF PRESENT ILLNESS:  Mr. Whitt Auletta is a 70 year old male.  He  is a Naval architect.  He was driving in the [place] area on July 04, 2005 when he had severe chest pain which was incapacitating.  It radiated to  the left arm.  He had presyncope, he had nausea, he had cold sweats.  He  felt  as if his heart was beating out of his chest.  He went to the emergency  room.  Chest pain resolved with the first administration of nitroglycerin.  The patient subsequently referred to Dr. Charlton Haws for consultation.  He  underwent left heart catheterization on July 11, 2005 secondary to  concerns that the patient might have coronary artery disease in view of risk  factors, including significant family history and previous tobacco  habituation.  Left heart catheterization, dictated above, shows  nonobstructive coronary artery disease with preserved ejection fraction of  60%.  The patient was discharged the same day, returning the next day,  July 12, 2005 with complaint of right groin pain radiating down to the  leg and up into the back.  This was possibly secondary to irritation from  Angio-Seal on the femoral nerve.  The patient will be admitted.  Ultrasound  of the right groin will be obtained.  It is noted that distal pulses in this  patient are intact.  There is no evidence of bleeding, bruit, or hematoma in  the right groin.   HOSPITAL COURSE:  Patient admitted July 12, 2005 with right groin pain.  Ultrasound of the right groin showed no evidence of pseudoaneurysm or AV  fistula.  Pulses to the distal right lower extremity were intact.  The   patient's pain has resolved overnight with administration of ibuprofen.  The  patient still complains of his chronic low back pain which also radiates to  the leg and into the back.  The patient will discharge on Cipro 250 mg  b.i.d.   The patient, as mentioned above, had recent workup for possible coronary  artery disease with catheterization on July 11, 2005 which showed  nonobstructive coronary artery disease.  During his hospitalization in  [place], cardiac enzymes were negative, and his chest pain resolved with  administration of nitroglycerin.  The patient does not wish to have a  prescription for nitroglycerin on discharge.  If chest pain recurs, CT of  the chest is recommended to rule out other pathology.   DIET:  Low sodium, low cholesterol diet.   ACTIVITY:  He is asked not to lift anything heavier than 10 pounds for the  next two weeks.  He may shower.   DISCHARGE INSTRUCTIONS:  He is to call [phone] if he experiences pain,  swelling, or bleeding at the catheterization site.   DISCHARGE MEDICATIONS:  1.  Cipro 250 mg one tablet twice daily for five days.  2.  Baby aspirin 81 mg, two tablets daily.  3.  Tramadol as needed.  4.  Ibuprofen as needed.  5.  Prevacid as needed.   FOLLOW UP:  He has followup with [facility], [address] with the P.A. on July 25, 2005 at 9:45 a.m.   LABORATORY DATA:  CBC on July 12, 2005 revealed white cells of 20.3,  hemoglobin 9.8, hematocrit 30.6, platelets 502.  Serum electrolytes on  April 12, 2005, sodium 139, potassium 3.7, chloride 107, bicarbonate 23,  BUN 23, creatinine 1.4.  PT on admission, July 12, 2005, is 13.5, INR  1.0, PTT 28.  Liver function studies revealed an alkaline phosphatase of 57,  SGOT 19, SGPT 22.  Returning to the CBC, white cells of 20.3, the patient  might donate urinary specimen prior to discharge.     Maple Mirza, P.A.    ______________________________   Charlton Haws, M.D.    GM/MEDQ  D:  07/13/2005  T:  07/16/2005  Job:  [id number]

## 2010-12-08 NOTE — Discharge Summary (Signed)
[name], [name]              ACCOUNT NO.:  [id number]   MEDICAL RECORD NO.:  [id number]          PATIENT TYPE:  OIB   LOCATION:  2899                         FACILITY:  [facility]   PHYSICIAN:  Charlton Haws, M.D.     DATE OF BIRTH:  1941-05-28   DATE OF ADMISSION:  07/11/2005  DATE OF DISCHARGE:  07/11/2005                                 DISCHARGE SUMMARY   ADDENDUM:  This addendum covers the period of admission at [facility] July 12, 2005 to July 13, 2005.  To review briefly the  patient had an admission to [place], [place], not [place], [place], on July 04, 2005 with severe chest pain, a feeling of  presyncope, nausea, diaphoresis, and a feeling that his heart was pounding  out of his chest.  This was persistent.  The patient pulled over, emergency  medical services took the patient to the hospital where the patient's chest  pain resolved rather quickly with the first administration of nitroglycerin.  The patient had cardiac enzymes which were negative, he was released, but  with followup here at [facility], he saw Dr. Charlton Haws who felt  that the patient's symptoms were worrisome, especially since he has a family  history of coronary artery disease and history of smoking having quit three  weeks ago.  He scheduled him for a left heart catheterization, this was done  July 11, 2005 and this showed nonobstructive coronary artery disease  ejection fraction 60%.  The patient then was released, but presented the  next day July 12, 2005 with right groin pain, possibly due to  aggravation of femoral nerve with Angioseal, this has resolved with a 16-  hour observation period at [facility] and administration of  ibuprofen.  The patient goes home with Cipro 250 mg twice daily.  The  patient at the time of discharge notes that he has a history of presyncope  especially when he has the flu and coughs so he has never had a  complete  collapse and also had a feeling with chest pain on July 04, 2005 that he  would lose consciousness.  This is just an addendum for the record, seems to  have special circumstances in order to provide a presyncopal feeling.      Maple Mirza, P.A.    ______________________________  Charlton Haws, M.D.    GM/MEDQ  D:  07/13/2005  T:  07/16/2005  Job:  [id number]

## 2010-12-08 NOTE — Letter (Signed)
February 10, 2008     RE:  [name], [name]  MRN:  [id number]  /  DOB:  June 21, 1941   To whom it may concern,   We have been treating [name] for a cardiac condition.  He had a  small heart attack, but has recovered, and now may pursue normal  activities.  I think he may return to work at this point without  restrictions.  We are happy to answer further questions regarding his  medical condition.    Sincerely,      Bruce R. Juanda Chance, MD, [facility]  Electronically Signed    BRB/MedQ  DD: 02/10/2008  DT: 02/11/2008  Job #: [id number]

## 2010-12-08 NOTE — Letter (Signed)
June 19, 2006     RE:  DOAK, MAH  MRN:  [id number]  /  DOB:  1941/01/17   To Whom It May Concern:   Mr. Balboa is a 70 year old patient that I have seen in Cardiology  Clinic as part of the [facility].   The patient had a heart catheterization performed on July 11, 2005.   During that catheterization, the patient had no critical coronary artery  disease.  He had essentially 20% disease in the left main and  noncritical 40% stenosis in the LAD and the right coronary arteries.   Angiographically, these lesions are not significant and certainly are  not flow limiting.   The patient is motivated to modify his coronary risk factors and has  stopped smoking.   Overall, this patient should not be considered at any extra risk in  regards to his cardiac status.  His heart catheterization was deemed low  risk and certainly did not require any intervention.   From an insurance standpoint, he should be considered low risk and not  be denied health insurance due to a diagnosis of coronary artery  disease.    Sincerely,     Noralyn Pick. Eden Emms, MD, [facility]  Electronically Signed   PCN/MedQ  DD: 06/19/2006  DT: 06/19/2006  Job #: [id number]

## 2010-12-08 NOTE — Letter (Signed)
February 20, 2008     RE:  BOW, BUNTYN  MRN:  [id number]  /  DOB:  24-Nov-1940   To Whom It May Concern:   CLINICAL HISTORY:  We have been treating Mr. [name] for a heart  condition since he suffered a heart attack in February 2009.  His other  medical problems include hypertension and abnormal cholesterol.  He is  currently unable to return to work.  We are planning further evaluation  with a treadmill test on March 22, 2008, and will make a decision  regarding his work status going forward at that time.  I will be happy  to answer any other questions regarding his medical condition.    Sincerely,      Bruce R. Juanda Chance, MD, [facility]  Electronically Signed    BRB/MedQ  DD: 02/20/2008  DT: 02/22/2008  Job #: [id number]

## 2010-12-08 NOTE — Cardiovascular Report (Signed)
NAMECARROL, BONDAR              ACCOUNT NO.:  [id number]   MEDICAL RECORD NO.:  [id number]          PATIENT TYPE:  OIB   LOCATION:  2899                         FACILITY:  [facility]   PHYSICIAN:  Charlton Haws, M.D.     DATE OF BIRTH:  01/30/1941   DATE OF PROCEDURE:  07/11/2005  DATE OF DISCHARGE:  07/11/2005                              CARDIAC CATHETERIZATION   Mr. Kesinger is a pleasant 70 year old patient I saw as a consult yesterday.  He has coronary risk factors, significant positive family history and  previous smoking. He was hospitalized in the [facility] area recently for  chest pain. He was discharged home and has had some recurrent intermittent  pains.   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   Cine catheterization was done from the right femoral artery with 6-French  catheters. Left main coronary artery had a 20% discrete stenosis.   Left anterior descending artery had 40% multiple discrete lesions in the  proximal and mid-vessel. Distal vessel had 20% multiple discrete lesions.   The diagonal branches were small but did not have critical disease.  Circumflex coronary artery was nondominant and normal.   Right coronary artery was dominant. There were 40% multiple discrete lesions  in the proximal and distal vessel. PDA and PLA were normal.   RAO VENTRICULOGRAPHY:  RAO ventriculography was normal. The EF was 60%.  There was no regional wall motion abnormalities. No gradient across the  aortic valve. Aortic pressure was 125/67, LV pressure was 120/12.   IMPRESSION:  The patient does not appear to have critical coronary artery  disease. His left ventricular function was normal. He ruled out for  myocardial infarction while in the [facility] area and was still not sure  why he had this significant chest pain. We did AngioSeal of the right  femoral artery. He will be discharged home. I will  see him in a week or two.  It may be worthwhile to do a chest CT to rule out any other pathology in the  chest. We will start him on Lipitor 10 milligrams a day since he does have  some evidence of atherosclerosis on his coronary arteriogram.   We will have to see if he has any recurrent pains in terms of further  workup, but for the time being, he will be maintained on aspirin and  Lipitor.   Note should be made that the patient apparently has a SHRIMP allergy. He has  had previous cath without problems. However, we did premedicate with 60 of  Solu-Medrol and 25 of Benadryl. He had no untoward reactions during the  case.           ______________________________  Charlton Haws, M.D.     PN/MEDQ  D:  07/11/2005  T:  07/12/2005  Job:  [id number]   cc:   Jeannett Senior A. Clent Ridges, M.D. [facility]  [address] [place]  [place] [address]

## 2010-12-08 NOTE — H&P (Signed)
[name], [name]              ACCOUNT NO.:  [id number]   MEDICAL RECORD NO.:  [id number]          PATIENT TYPE:  INP   LOCATION:  2023                         FACILITY:  [facility]   PHYSICIAN:  [name], M.D.    DATE OF BIRTH:  10-28-1940   DATE OF ADMISSION:  07/12/2005  DATE OF DISCHARGE:                                HISTORY & PHYSICAL   PRIMARY CARE PHYSICIAN:  Dr. Abran [name].   PRIMARY CARDIOLOGIST:  [name], M.D.   PATIENT PROFILE:  A 70 year old white male with a prior history of  nonobstructive coronary disease presents with right groin pain, following  catheterization yesterday.   PROBLEM LIST:  1.  Right groin pain.  2.  History of chest pain.      1.  Status post cath x3.  The last one being July 11, 2005, by Dr.          Eden [name], showing nonobstructive coronary disease.  The patient's          groin site was closed with Angio-Seal.  3.  GERD.  4.  Chronic low back pain/leg pain.  5.  Remote tobacco abuse.   HISTORY OF PRESENT ILLNESS:  A 70 year old male with a history of  nonobstructive coronary disease.  He was recently seen in [place], [place] for chest pain and ruled out and was discharged.  He followed up  with Dr. Eden [name] and underwent cardiac catheterization as an outpatient, on  July 11, 2005, revealing nonobstructive coronary disease.  His groin was  closed with an Angio-Seal.  Since discharge yesterday, he has had constant  right groin pain radiating down his leg and into his back.  He came to the  cath lab today to be evaluated and was advised to present to the ED after  discussion with Dr. Eden [name].  In the ED, he currently reports being pain free  as he took ibuprofen 800 mg x1 this morning before presenting to the  hospital.   ALLERGIES:  1.  LOPRESSOR causes a rash.  2.  An ANTIBIOTIC of UNKNOWN NAME.   CURRENT MEDICATIONS:  1.  Aspirin 182 mg every day.  2.  Tramadol p.r.n.  3.  Prevacid 30 mg p.r.n.   FAMILY HISTORY:   Mother died of leukemia at age 39.  Father died in a motor  vehicle accident at age 16 but in his lifetime had eight strokes and six  heart attacks.  He has two sisters who he does not know anything about.   REVIEW OF SYSTEMS:  Positive for right groin pain and a history of chest  pain, all other systems are reviewed and negative.   PHYSICAL EXAMINATION:  VITAL SIGNS:  Temperature 97.4, heart rate 87,  respirations 18, blood pressure is 137/83.  His pulse ox is 96% on room air.  GENERAL:  A pleasant white male in no acute distress.  Awake, alert, and  oriented x3.  NECK:  Normal carotid upstrokes.  No bruits or JVD.  LUNGS:  Respirations regular unlabored.  Clear to auscultation.  CARDIAC:  Regular S1 S2.  No S3,  S4, or murmur.  ABDOMEN:  Round, soft, nontender, nondistended.  Bowel sounds present x4.  EXTREMITIES:  Warm, dry, pink.  No clubbing, cyanosis, or edema.  Dorsalis  pedis pulses are 1+ and posterior tibial pulses are 2+ bilaterally.  Right  groin which was used for cath yesterday is without bleeding, bruit, or  hematoma.  It is tender to touch.   He has not had a chest x-ray yet.  His EKG and labs are pending.   ASSESSMENT:  Right groin pain.  He is status post catheterization with Angio-  Seal closure yesterday.  He is now pain free but still tender.  The site  looks good without bleeding, bruit, or hematoma and distal pulses are  intact.  He is afebrile, and his white count is pending.   PLAN:  We will plan to observe overnight and get a groin ultrasound to rule  out pseudoaneurysm or arteriovenous fistula.      Christopher Anis, NP    ______________________________  [name], M.D.    CRB/MEDQ  D:  07/12/2005  T:  07/13/2005  Job:  [id number]

## 2010-12-13 ENCOUNTER — Other Ambulatory Visit: Payer: Self-pay | Admitting: Cardiovascular Disease

## 2010-12-28 ENCOUNTER — Telehealth: Payer: Self-pay | Admitting: *Deleted

## 2010-12-28 NOTE — Telephone Encounter (Signed)
Refill on clonazepam 0.5mg 

## 2010-12-29 MED ORDER — CLONAZEPAM 0.5 MG PO TABS: 0.5000 mg | ORAL_TABLET | Freq: Three times a day (TID) | ORAL | Status: DC | PRN

## 2010-12-29 NOTE — Telephone Encounter (Signed)
Call in #90 with 5 rf 

## 2011-01-11 ENCOUNTER — Telehealth: Payer: Self-pay | Admitting: *Deleted

## 2011-01-11 NOTE — Telephone Encounter (Signed)
We called in #90 with 5 rf on 12-29-10 so cancel all these refills. Instead call in #270 with one rf

## 2011-01-11 NOTE — Telephone Encounter (Signed)
rx for clonazepam 0.5mg  #270 1 tid with refills.

## 2011-01-12 MED ORDER — CLONAZEPAM 0.5 MG PO TABS: 0.5000 mg | ORAL_TABLET | Freq: Three times a day (TID) | ORAL | Status: DC | PRN

## 2011-01-12 NOTE — Telephone Encounter (Signed)
Addended by: Romualdo Bolk on: 01/12/2011 07:53 AM   Modules accepted: Orders

## 2011-01-25 ENCOUNTER — Other Ambulatory Visit: Payer: Self-pay | Admitting: Family Medicine

## 2011-01-26 NOTE — Telephone Encounter (Signed)
Call in #90 with 5 rf 

## 2011-02-22 ENCOUNTER — Telehealth: Payer: Self-pay | Admitting: Family Medicine

## 2011-02-22 NOTE — Telephone Encounter (Signed)
Pt has an appt on 02-28-2011 to see Dr Clent Ridges, and would like a refill Of his Oxycodone. Thanks.

## 2011-02-23 MED ORDER — OXYCODONE-ACETAMINOPHEN 10-650 MG PO TABS: 1.0000 | ORAL_TABLET | Freq: Four times a day (QID) | ORAL | Status: AC | PRN

## 2011-02-23 NOTE — Telephone Encounter (Signed)
Script ready for pick up and pt aware. 

## 2011-02-23 NOTE — Telephone Encounter (Signed)
done

## 2011-02-26 ENCOUNTER — Encounter: Payer: Self-pay | Admitting: Family Medicine

## 2011-02-28 ENCOUNTER — Encounter: Payer: Self-pay | Admitting: Family Medicine

## 2011-02-28 ENCOUNTER — Ambulatory Visit (INDEPENDENT_AMBULATORY_CARE_PROVIDER_SITE_OTHER): Payer: Medicare Other | Admitting: Family Medicine

## 2011-02-28 VITALS — BP 130/78 | HR 87 | Temp 98.2°F | Wt 232.0 lb

## 2011-02-28 DIAGNOSIS — B379 Candidiasis, unspecified: Secondary | ICD-10-CM

## 2011-02-28 DIAGNOSIS — K6289 Other specified diseases of anus and rectum: Secondary | ICD-10-CM

## 2011-02-28 MED ORDER — HYDROCORTISONE ACETATE 25 MG RE SUPP: 25.0000 mg | Freq: Three times a day (TID) | RECTAL | Status: AC

## 2011-02-28 MED ORDER — KETOCONAZOLE 2 % EX CREA: TOPICAL_CREAM | Freq: Three times a day (TID) | CUTANEOUS | Status: DC

## 2011-02-28 MED ORDER — MESALAMINE 1000 MG RE SUPP: 1000.0000 mg | Freq: Three times a day (TID) | RECTAL | Status: DC

## 2011-02-28 NOTE — Progress Notes (Signed)
  Subjective:    Patient ID: [name], male    DOB: February 25, 1941, 70 y.o.   MRN: [id number]  HPI Here for 4 weeks of pain and irritation around the anus, and he often has some bright red bleeding during BMs. He is past due for another colonoscopy. No abdominal pain. He is trying to consume more dietary fiber.   Review of Systems  Constitutional: Negative.   Gastrointestinal: Positive for anal bleeding and rectal pain. Negative for nausea, vomiting, diarrhea, constipation and abdominal distention.       Objective:   Physical Exam  Constitutional: He appears well-developed and well-nourished.  Abdominal: Soft. Bowel sounds are normal. He exhibits no distension and no mass. There is no tenderness. There is no rebound and no guarding.  Genitourinary:       The perianal area is red, cracked open in places, and quite tender. No hemorrhoids are seen          Assessment & Plan:  This seems to be a proctitis with some external Candidiasis. Treat externally and internally.

## 2011-03-21 ENCOUNTER — Encounter: Payer: Self-pay | Admitting: Family Medicine

## 2011-03-21 ENCOUNTER — Ambulatory Visit (INDEPENDENT_AMBULATORY_CARE_PROVIDER_SITE_OTHER): Payer: Medicare Other | Admitting: Family Medicine

## 2011-03-21 VITALS — BP 130/80 | HR 85 | Temp 98.0°F | Wt 232.0 lb

## 2011-03-21 DIAGNOSIS — B3749 Other urogenital candidiasis: Secondary | ICD-10-CM

## 2011-03-21 DIAGNOSIS — B3789 Other sites of candidiasis: Secondary | ICD-10-CM

## 2011-03-21 MED ORDER — FLUCONAZOLE 200 MG PO TABS: 200.0000 mg | ORAL_TABLET | Freq: Two times a day (BID) | ORAL | Status: AC

## 2011-03-21 NOTE — Progress Notes (Signed)
  Subjective:    Patient ID: [name], male    DOB: Jan 17, 1941, 70 y.o.   MRN: [id number]  HPI Here for continued problems with a painfu;l rash around the scrotum, perineum, and anus. He is using Ketoconazole cream with only slight improvement.    Review of Systems  Constitutional: Negative.   Skin: Positive for rash.       Objective:   Physical Exam  Constitutional: He appears well-developed and well-nourished.  Skin:       Macerated skin around the perineum, slightly improved from the last exam           Assessment & Plan:  Continue the cream but add oral Diflucan for several weeks

## 2011-03-28 ENCOUNTER — Telehealth: Payer: Self-pay | Admitting: Family Medicine

## 2011-03-28 NOTE — Telephone Encounter (Signed)
Requesting a Doctor's excuse note to be exempt from jury duty, which is in the next couple of weeks, due to his health issues, that Dr Clent Ridges knows about. Please call pt when ready for pick up. Thanks.

## 2011-03-29 NOTE — Telephone Encounter (Signed)
done

## 2011-03-29 NOTE — Telephone Encounter (Signed)
Note is ready for pick up and pt aware. 

## 2011-04-09 ENCOUNTER — Telehealth: Payer: Self-pay | Admitting: *Deleted

## 2011-04-09 DIAGNOSIS — R21 Rash and other nonspecific skin eruption: Secondary | ICD-10-CM

## 2011-04-09 NOTE — Telephone Encounter (Signed)
Pt left a message on Triage voice mail that he would like a referral to Derm ASAP, and Dr. Clent Ridges would know what he is talking about.  Called him back, and he states the referral is for yeast dermatitis.

## 2011-04-09 NOTE — Telephone Encounter (Signed)
Tell the referral was done and Camelia Eng will call him

## 2011-04-11 NOTE — Telephone Encounter (Signed)
Spoke with pt and gave info

## 2011-04-11 NOTE — Telephone Encounter (Signed)
Called to make pt aware.  Left a message for pt to return call.   

## 2011-04-13 LAB — HEPATIC FUNCTION PANEL
Albumin: 3.6
Alkaline Phosphatase: 47
Total Protein: 6.1

## 2011-04-13 LAB — IRON AND TIBC
Iron: 13 — ABNORMAL LOW
TIBC: 471 — ABNORMAL HIGH
UIBC: 458

## 2011-04-13 LAB — CBC
HCT: 30.1 — ABNORMAL LOW
Hemoglobin: 9.4 — ABNORMAL LOW
MCHC: 31.1
MCHC: 31.1
MCHC: 31.2
MCV: 68.9 — ABNORMAL LOW
Platelets: 433 — ABNORMAL HIGH
Platelets: 498 — ABNORMAL HIGH
RDW: 18.1 — ABNORMAL HIGH
RDW: 18.1 — ABNORMAL HIGH
RDW: 18.3 — ABNORMAL HIGH

## 2011-04-13 LAB — DIFFERENTIAL
Basophils Absolute: 0.2 — ABNORMAL HIGH
Eosinophils Absolute: 0
Lymphocytes Relative: 11 — ABNORMAL LOW
Lymphs Abs: 1.7
Monocytes Relative: 6
Neutro Abs: 12.2 — ABNORMAL HIGH

## 2011-04-13 LAB — LIPID PANEL
Cholesterol: 139
HDL: 33 — ABNORMAL LOW
LDL Cholesterol: 85
Triglycerides: 103

## 2011-04-13 LAB — POCT CARDIAC MARKERS
CKMB, poc: 1.9
Myoglobin, poc: 59
Myoglobin, poc: 74.1
Operator id: 198171
Troponin i, poc: <0.05

## 2011-04-13 LAB — I-STAT 8, (EC8 V) (CONVERTED LAB)
Acid-base deficit: 3 — ABNORMAL HIGH
BUN: 16
Chloride: 106
HCT: 38 — ABNORMAL LOW
Potassium: 4.5
pCO2, Ven: 37.6 — ABNORMAL LOW
pH, Ven: 7.376 — ABNORMAL HIGH

## 2011-04-13 LAB — URINALYSIS, ROUTINE W REFLEX MICROSCOPIC
Hgb urine dipstick: NEGATIVE
Nitrite: NEGATIVE
Specific Gravity, Urine: 1.012
Urobilinogen, UA: 1
pH: 6

## 2011-04-13 LAB — CARDIAC PANEL(CRET KIN+CKTOT+MB+TROPI)
CK, MB: 4.5 — ABNORMAL HIGH
Relative Index: INVALID
Relative Index: INVALID
Relative Index: INVALID
Troponin I: 0.2 — ABNORMAL HIGH
Troponin I: 0.24 — ABNORMAL HIGH

## 2011-04-13 LAB — URINE CULTURE

## 2011-04-13 LAB — POCT HEMOGLOBIN-HEMACUE
Hemoglobin: 9.5 — ABNORMAL LOW
Hemoglobin: 9.9 — ABNORMAL LOW

## 2011-04-13 LAB — BASIC METABOLIC PANEL
BUN: 17
CO2: 26
CO2: 26
Calcium: 8.6
Chloride: 103
Creatinine, Ser: 1.11
Glucose, Bld: 128 — ABNORMAL HIGH
Potassium: 4
Sodium: 139

## 2011-04-13 LAB — POCT I-STAT CREATININE: Creatinine, Ser: 0.8

## 2011-04-13 LAB — PROTIME-INR
INR: 1
Prothrombin Time: 13.2

## 2011-04-13 LAB — VITAMIN B12: Vitamin B-12: 312 (ref 211–911)

## 2011-04-13 LAB — RETICULOCYTES: RBC.: 4.52

## 2011-04-13 LAB — HEMOGLOBIN A1C: Hgb A1c MFr Bld: 5.8

## 2011-05-07 ENCOUNTER — Other Ambulatory Visit: Payer: Self-pay | Admitting: Family Medicine

## 2011-05-23 ENCOUNTER — Encounter: Payer: Self-pay | Admitting: Family Medicine

## 2011-05-23 ENCOUNTER — Ambulatory Visit (INDEPENDENT_AMBULATORY_CARE_PROVIDER_SITE_OTHER): Payer: Medicare Other | Admitting: Family Medicine

## 2011-05-23 VITALS — BP 130/82 | HR 89 | Temp 98.6°F | Wt 232.0 lb

## 2011-05-23 DIAGNOSIS — I2510 Atherosclerotic heart disease of native coronary artery without angina pectoris: Secondary | ICD-10-CM

## 2011-05-23 DIAGNOSIS — I509 Heart failure, unspecified: Secondary | ICD-10-CM

## 2011-05-23 DIAGNOSIS — J329 Chronic sinusitis, unspecified: Secondary | ICD-10-CM

## 2011-05-23 MED ORDER — FUROSEMIDE 40 MG PO TABS: 40.0000 mg | ORAL_TABLET | Freq: Every day | ORAL | Status: DC

## 2011-05-23 MED ORDER — OXYCODONE-ACETAMINOPHEN 10-650 MG PO TABS: 1.0000 | ORAL_TABLET | Freq: Four times a day (QID) | ORAL | Status: AC | PRN

## 2011-05-23 MED ORDER — AMOXICILLIN-POT CLAVULANATE 875-125 MG PO TABS: 1.0000 | ORAL_TABLET | Freq: Two times a day (BID) | ORAL | Status: AC

## 2011-05-23 NOTE — Progress Notes (Signed)
  Subjective:    Patient ID: [name], male    DOB: [birth year], 70 y.o.   MRN: [id number]  HPI Here for one week of sinus pressure, HA, PND, ST, and coughing up yellow sputum. No fever. On Mucinex. As I ask him about any swelling he might be having, he admits to having more swelling than usual in the lower legs and feet over the past few months. He has noticed some increased SOB over the past few months without chest pains. He last saw Dr. Juanda Chance one year ago.   Review of Systems  Constitutional: Negative.   HENT: Positive for congestion, postnasal drip and sinus pressure.   Eyes: Negative.   Respiratory: Positive for cough and shortness of breath. Negative for wheezing.   Cardiovascular: Positive for leg swelling. Negative for chest pain and palpitations.       Objective:   Physical Exam  Constitutional: He appears well-developed and well-nourished.  HENT:  Right Ear: External ear normal.  Left Ear: External ear normal.  Nose: Nose normal.  Mouth/Throat: Oropharynx is clear and moist. No oropharyngeal exudate.  Eyes: Conjunctivae are normal. Pupils are equal, round, and reactive to light.  Neck: No thyromegaly present.  Cardiovascular: Normal rate, regular rhythm, normal heart sounds and intact distal pulses.  Exam reveals no gallop and no friction rub.   No murmur heard. Pulmonary/Chest: Effort normal. No respiratory distress. He has no wheezes. He has rales.       Rales are present at both bases   Musculoskeletal:       2+ edema to both feet and ankles  Lymphadenopathy:    He has no cervical adenopathy.          Assessment & Plan:  Treat the sinusitis with antibiotics. He seems to have developed some CHF symptoms. Add Lasix daily. Set up an ECHO soon. See Cardiology again

## 2011-06-01 ENCOUNTER — Telehealth: Payer: Self-pay | Admitting: Family Medicine

## 2011-06-01 NOTE — Telephone Encounter (Signed)
Refill request for Hydrocodon-Acetaminophen 5-500 mg take 1 po qid prn and pt last here on 05/23/11.

## 2011-06-04 MED ORDER — HYDROCODONE-ACETAMINOPHEN 5-500 MG PO TABS: 1.0000 | ORAL_TABLET | Freq: Four times a day (QID) | ORAL | Status: DC | PRN

## 2011-06-04 NOTE — Telephone Encounter (Signed)
Call in #120 with 5 rf 

## 2011-06-04 NOTE — Telephone Encounter (Signed)
rx called into pharmacy

## 2011-06-05 ENCOUNTER — Encounter: Payer: Self-pay | Admitting: Cardiovascular Disease

## 2011-06-05 ENCOUNTER — Ambulatory Visit (INDEPENDENT_AMBULATORY_CARE_PROVIDER_SITE_OTHER): Payer: Medicare Other | Admitting: Family Medicine

## 2011-06-05 ENCOUNTER — Ambulatory Visit: Payer: Medicare Other | Admitting: Family Medicine

## 2011-06-05 ENCOUNTER — Ambulatory Visit (HOSPITAL_COMMUNITY): Payer: Medicare Other | Attending: Cardiology | Admitting: Radiology

## 2011-06-05 ENCOUNTER — Ambulatory Visit (INDEPENDENT_AMBULATORY_CARE_PROVIDER_SITE_OTHER): Payer: Medicare Other | Admitting: Cardiovascular Disease

## 2011-06-05 ENCOUNTER — Encounter: Payer: Self-pay | Admitting: Family Medicine

## 2011-06-05 VITALS — BP 136/90 | HR 82 | Temp 98.6°F | Wt 227.0 lb

## 2011-06-05 VITALS — BP 134/80 | HR 60 | Ht 70.0 in | Wt 229.0 lb

## 2011-06-05 DIAGNOSIS — S6000XA Contusion of unspecified finger without damage to nail, initial encounter: Secondary | ICD-10-CM

## 2011-06-05 DIAGNOSIS — I5033 Acute on chronic diastolic (congestive) heart failure: Secondary | ICD-10-CM | POA: Insufficient documentation

## 2011-06-05 DIAGNOSIS — I2510 Atherosclerotic heart disease of native coronary artery without angina pectoris: Secondary | ICD-10-CM | POA: Insufficient documentation

## 2011-06-05 DIAGNOSIS — I509 Heart failure, unspecified: Secondary | ICD-10-CM

## 2011-06-05 DIAGNOSIS — I079 Rheumatic tricuspid valve disease, unspecified: Secondary | ICD-10-CM | POA: Insufficient documentation

## 2011-06-05 DIAGNOSIS — I059 Rheumatic mitral valve disease, unspecified: Secondary | ICD-10-CM | POA: Insufficient documentation

## 2011-06-05 DIAGNOSIS — F17200 Nicotine dependence, unspecified, uncomplicated: Secondary | ICD-10-CM | POA: Insufficient documentation

## 2011-06-05 DIAGNOSIS — S60019A Contusion of unspecified thumb without damage to nail, initial encounter: Secondary | ICD-10-CM

## 2011-06-05 MED ORDER — FUROSEMIDE 40 MG PO TABS: 20.0000 mg | ORAL_TABLET | Freq: Every day | ORAL | Status: DC

## 2011-06-05 NOTE — Progress Notes (Signed)
  Subjective:    Patient ID: [name], male    DOB: Mar 30, 1941, 70 y.o.   MRN: [id number]  HPI Here for an apparent injury to the left thumb. He does not remember any trauma, but the thumb started to get swollen and tender 6 days ago after he was working on his house. He has been taking Amoxicillin during this time for another infection. By this morning the thumb actually looks and feels better. No fevers   Review of Systems  Constitutional: Negative.        Objective:   Physical Exam  Constitutional: He appears well-developed and well-nourished.  Musculoskeletal:       The tip of the left thumb is mildly tender and swollen, no erythema or warmth          Assessment & Plan:  It is hard to tell what may have happened to the finger, but it seems to be getting better. Recheck prn

## 2011-06-05 NOTE — Assessment & Plan Note (Addendum)
BP well controlled. LV function normal. Volume is good today. Will lower Lasix to 20 mg po Qdaily.

## 2011-06-05 NOTE — Patient Instructions (Signed)
Your physician has recommended you make the following change in your medication:  Decrease Lasix to 20 mg daily.  Take half of your 40 mg tablet daily.    Your physician wants you to follow-up in: 6 months.  You will receive a reminder letter in the mail two months in advance. If you don't receive a letter, please call our office to schedule the follow-up appointment.

## 2011-06-05 NOTE — Assessment & Plan Note (Signed)
Stable No changes 

## 2011-06-05 NOTE — Progress Notes (Signed)
History of Present Illness: 70 yo WM with history of CAD, HTN, HLD, OSA, bradycardia here today for cardiac follow up. He has been followed in the past by Dr. Juanda Chance. Catheterization in 2009 showed nonobstructive CAD. October 2011 he was admitted from the GI office was in acute GI bleeding. He required transfusions. He was seen by Dr. Clent Ridges on 05/23/11 and was started on lasix for lower extremity edema and SOB. His breathing is better on lasix. His lower extremity edema has resolved. He has occasional chest pains. This is usually several hours after lifting heavy objects. He gets easily SOB with strenuous activities. Echocardiogram today as ordered by Dr. Clent Ridges shows normal LV function and normal wall motion.   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  Past Medical History  Diagnosis Date  . Anemia   . Hyperlipidemia   . Hypertension   . Allergy   . Myocardial infarction   . Ulcer   . GERD (gastroesophageal reflux disease)   . Foot pain     left  . Restless leg syndrome   . Hypothyroid   . Headache   . Obstructive sleep apnea   . Fatigue   . CAD (coronary artery disease)   . Gastritis   . Bronchitis   . Back pain     low  . Diverticulosis   . Colon polyps     Past Surgical History  Procedure Date  . Appendectomy   . Hernia repair   . Tonsillectomy   . Ankle surgery     left     Current Outpatient Prescriptions  Medication Sig Dispense Refill  . amLODipine (NORVASC) 5 MG tablet Take 1 tablet (5 mg total) by mouth daily.  30 tablet  11  . amoxicillin-clavulanate (AUGMENTIN) 875-125 MG per tablet daily      . Ascorbic Acid (VITAMIN C) 1000 MG tablet Take 1,000 mg by mouth daily.        [name] aspirin 81 MG tablet Take 81 mg by mouth daily.        . calcium gluconate 500 MG tablet Take 500 mg by mouth 2 (two) times daily.        . clonazePAM (KLONOPIN) 0.5 MG tablet Take 1 tablet (0.5 mg total) by mouth  3 (three) times daily as needed for anxiety.  270 tablet  1  . cyclobenzaprine (FLEXERIL) 10 MG tablet TAKE 1 TABLET 3 TIMES A DAY AS NEEDED FOR SPASMS  60 tablet  5  . diclofenac sodium (VOLTAREN) 1 % GEL Apply 1 application topically 4 (four) times daily.  5 Tube  10  . ferrous sulfate 325 (65 FE) MG tablet Take 325 mg by mouth 2 (two) times daily.        . fish oil-omega-3 fatty acids 1000 MG capsule Take 2 g by mouth daily.        . furosemide (LASIX) 40 MG tablet Take 1 tablet (40 mg total) by mouth daily.  30 tablet  11  . HYDROcodone-acetaminophen (VICODIN) 5-500 MG per tablet Take 1 tablet by mouth every 6 (six) hours as needed.  120 tablet  5  . levothyroxine (SYNTHROID, LEVOTHROID) 150 MCG tablet TAKE 1 TABLET BY MOUTH EVERY DAY  30 tablet  10  . Multiple Vitamin (MULTIVITAMIN) tablet Take 1 tablet by mouth daily.        [name] NITROSTAT 0.4 MG SL tablet PALCE 1 TABLET  UNDER TONGUE EVERY 5 MINUTES FOR 3 DOSES AS NEEDED  25 tablet  6  . omeprazole (PRILOSEC) 20 MG capsule TAKE 1 CAPSULE BY MOUTH EVERY DAY  90 capsule  2  . oxyCODONE-acetaminophen (PERCOCET) 10-650 MG per tablet As needed      . rOPINIRole (REQUIP) 2 MG tablet Take 2 mg by mouth at bedtime. Take 2 at bedtime       . Casanthranol-Docusate Sodium 30-100 MG CAPS Take by mouth as needed.          Allergies  Allergen Reactions  . Lipitor (Atorvastatin Calcium)     myalgias  . Metoprolol Tartrate   . Shellfish Allergy   . Simvastatin     myalgias  . Trazodone And Nefazodone     Unsteady on feet  . Zolpidem     Chest pain    History   Social History  . Marital Status: Married    Spouse Name: N/A    Number of Children: N/A  . Years of Education: N/A   Occupational History  . Not on file.   Social History Main Topics  . Smoking status: Former Smoker -- 2.0 packs/day  . Smokeless tobacco: Never Used  . Alcohol Use: No  . Drug Use: No  . Sexually Active: Not on file   Other Topics Concern  . Not on file    Social History Narrative  . No narrative on file    Family History  Problem Relation Age of Onset  . Heart disease Father     Review of Systems:  As stated in the HPI and otherwise negative.   BP 134/80  Pulse 60  Ht 5\' 10"  (1.778 m)  Wt 229 lb (103.874 kg)  BMI 32.86 kg/m2  Physical Examination: General: Well developed, well nourished, NAD HEENT: OP clear, mucus membranes moist SKIN: warm, dry. No rashes. Neuro: No focal deficits Musculoskeletal: Muscle strength 5/5 all ext Psychiatric: Mood and affect normal Neck: No JVD, no carotid bruits, no thyromegaly, no lymphadenopathy. Lungs:Clear bilaterally, no wheezes, rhonci, crackles Cardiovascular: Regular rate and rhythm. No murmurs, gallops or rubs. Abdomen:Soft. Bowel sounds present. Non-tender.  Extremities: No lower extremity edema. Pulses are 2 + in the bilateral DP/PT.  Echo: Preserved LV function, no significant valvular issues.

## 2011-06-06 NOTE — Progress Notes (Signed)
Quick Note:  Spoke with pt ______ 

## 2011-06-16 ENCOUNTER — Encounter: Payer: Self-pay | Admitting: Family Medicine

## 2011-06-16 ENCOUNTER — Ambulatory Visit (INDEPENDENT_AMBULATORY_CARE_PROVIDER_SITE_OTHER): Payer: Medicare Other | Admitting: Family Medicine

## 2011-06-16 VITALS — BP 130/80 | HR 75 | Temp 97.7°F | Wt 229.0 lb

## 2011-06-16 DIAGNOSIS — J019 Acute sinusitis, unspecified: Secondary | ICD-10-CM

## 2011-06-16 MED ORDER — AMOXICILLIN-POT CLAVULANATE 875-125 MG PO TABS: 1.0000 | ORAL_TABLET | Freq: Two times a day (BID) | ORAL | Status: AC

## 2011-06-16 MED ORDER — FLUTICASONE PROPIONATE 50 MCG/ACT NA SUSP: 1.0000 | Freq: Every day | NASAL | Status: DC

## 2011-06-16 MED ORDER — FLUTICASONE PROPIONATE 50 MCG/ACT NA SUSP: 2.0000 | Freq: Every day | NASAL | Status: DC

## 2011-06-16 NOTE — Progress Notes (Signed)
  Subjective:    Patient ID: [name], male    DOB: Aug 20, 1940, 70 y.o.   MRN: [id number]  HPI 30 presents to Saturday clinic with one week of worsening sinus pressure, HA, ST and productive cough. Afebrile.  No fever. On Mucinex.  No CP or SOB.    Review of Systems  See HPI Constitutional: Negative.   HENT: Positive for congestion, postnasal drip and sinus pressure.   Eyes: Negative.   Resp:  Negative for wheezing      Objective:   Physical Exam  BP 130/80  Pulse 75  Temp(Src) 97.7 F (36.5 C) (Oral)  Wt 229 lb (103.874 kg)  Constitutional: He appears well-developed and well-nourished.  HENT:  Right Ear: External ear normal.  Left Ear: External ear normal.  Nose: pos boggy turbinates, erythema, complains of sinus TTP throughout Mouth/Throat: Oropharynx is clear and moist. No oropharyngeal exudate.  Eyes: Conjunctivae are normal. Pupils are equal, round, and reactive to light.  Neck: No thyromegaly present.  Cardiovascular: Normal rate, regular rhythm, normal heart sounds and intact distal pulses.  Exam reveals no gallop and no friction rub.   No murmur heard. Pulmonary/Chest: Effort normal. No respiratory distress. He has no wheezes.   .    Assessment & Plan:   1. Acute sinusitis, unspecified   Given duration and progression of symptoms, will treat for bacterial sinusitis with Augmentin. See pt instructions for details.

## 2011-06-16 NOTE — Patient Instructions (Signed)
Take antibiotic as directed.  Drink lots of fluids.    Treat sympotmatically with Mucinex, nasal saline irrigation, and Tylenol/Ibuprofen.   You can use warm compresses.  Cough suppressant at night.   Call if not improving as expected in 5-7 days.    

## 2011-06-19 ENCOUNTER — Emergency Department (HOSPITAL_COMMUNITY)
Admission: EM | Admit: 2011-06-19 | Discharge: 2011-06-19 | Disposition: A | Discharge status: Home / Self Care | Payer: Medicare Other | Attending: Emergency Medicine | Admitting: Emergency Medicine

## 2011-06-19 ENCOUNTER — Emergency Department (HOSPITAL_COMMUNITY): Payer: Medicare Other

## 2011-06-19 ENCOUNTER — Encounter (HOSPITAL_COMMUNITY): Payer: Self-pay | Admitting: *Deleted

## 2011-06-19 DIAGNOSIS — G4733 Obstructive sleep apnea (adult) (pediatric): Secondary | ICD-10-CM | POA: Insufficient documentation

## 2011-06-19 DIAGNOSIS — I2510 Atherosclerotic heart disease of native coronary artery without angina pectoris: Secondary | ICD-10-CM | POA: Insufficient documentation

## 2011-06-19 DIAGNOSIS — M542 Cervicalgia: Secondary | ICD-10-CM | POA: Insufficient documentation

## 2011-06-19 DIAGNOSIS — S1093XA Contusion of unspecified part of neck, initial encounter: Secondary | ICD-10-CM | POA: Insufficient documentation

## 2011-06-19 DIAGNOSIS — Z7982 Long term (current) use of aspirin: Secondary | ICD-10-CM | POA: Insufficient documentation

## 2011-06-19 DIAGNOSIS — E039 Hypothyroidism, unspecified: Secondary | ICD-10-CM | POA: Insufficient documentation

## 2011-06-19 DIAGNOSIS — S0083XA Contusion of other part of head, initial encounter: Secondary | ICD-10-CM

## 2011-06-19 DIAGNOSIS — Z79899 Other long term (current) drug therapy: Secondary | ICD-10-CM | POA: Insufficient documentation

## 2011-06-19 DIAGNOSIS — I10 Essential (primary) hypertension: Secondary | ICD-10-CM | POA: Insufficient documentation

## 2011-06-19 DIAGNOSIS — K219 Gastro-esophageal reflux disease without esophagitis: Secondary | ICD-10-CM | POA: Insufficient documentation

## 2011-06-19 DIAGNOSIS — M25539 Pain in unspecified wrist: Secondary | ICD-10-CM | POA: Insufficient documentation

## 2011-06-19 DIAGNOSIS — R220 Localized swelling, mass and lump, head: Secondary | ICD-10-CM | POA: Insufficient documentation

## 2011-06-19 DIAGNOSIS — I252 Old myocardial infarction: Secondary | ICD-10-CM | POA: Insufficient documentation

## 2011-06-19 DIAGNOSIS — S7010XA Contusion of unspecified thigh, initial encounter: Secondary | ICD-10-CM | POA: Insufficient documentation

## 2011-06-19 DIAGNOSIS — S0993XA Unspecified injury of face, initial encounter: Secondary | ICD-10-CM | POA: Insufficient documentation

## 2011-06-19 DIAGNOSIS — W010XXA Fall on same level from slipping, tripping and stumbling without subsequent striking against object, initial encounter: Secondary | ICD-10-CM | POA: Insufficient documentation

## 2011-06-19 DIAGNOSIS — S0003XA Contusion of scalp, initial encounter: Secondary | ICD-10-CM | POA: Insufficient documentation

## 2011-06-19 DIAGNOSIS — E785 Hyperlipidemia, unspecified: Secondary | ICD-10-CM | POA: Insufficient documentation

## 2011-06-19 MED ORDER — HYDROCODONE-ACETAMINOPHEN 5-325 MG PO TABS: 1.0000 | ORAL_TABLET | Freq: Four times a day (QID) | ORAL | Status: AC | PRN

## 2011-06-19 MED ORDER — HYDROCODONE-ACETAMINOPHEN 5-325 MG PO TABS
1.0000 | ORAL_TABLET | Freq: Once | ORAL | Status: AC
  Administered 2011-06-19: 1 via ORAL
  Filled 2011-06-19: qty 1

## 2011-06-19 NOTE — ED Notes (Signed)
Pt medicated prior to going to xray and CT. Assessment unchanged. No distress noted.

## 2011-06-19 NOTE — ED Notes (Signed)
Pt slipped on wet leaves and hit left side of face.  Unknown LOC.  Pt has swelling to left side of face.  Pt has pain and swelling to right hand too.

## 2011-06-19 NOTE — ED Notes (Signed)
Pt states slipped on leaves and struck face on a tree root. Pt has swelling to left eye and cheek. Pt able to see out of eye but is swollen shut. Pt c/o pain to right wrist as well. Pt unsure of LOC

## 2011-06-19 NOTE — ED Provider Notes (Signed)
History     CSN: [id number] Arrival date & time: 06/19/2011  1:59 PM   First MD Initiated Contact with Patient 06/19/11 1610      Chief Complaint  Patient presents with  . Facial Injury    left    (Consider location/radiation/quality/duration/timing/severity/associated sxs/prior treatment) Patient is a 70 y.o. male presenting with facial injury. The history is provided by the patient.  Facial Injury  Episode onset: At 1:30 PM today. The incident occurred at home (Patient slipped on wet leaves outside and fell into a sttump). The injury mechanism was a fall. The wounds were not self-inflicted. Pertinent negatives include no chest pain, no visual disturbance, no abdominal pain, no nausea, no vomiting and no headaches.   Patient struck the stump of the left side of his face, questionable loss of consciousness definitely no syncope. Able to see out of the left eye but now has marked swelling of the left cheek and around the left eye. Patient denies being on the Coumadin admits to taking aspirin, denies being on Plavix. The only other injury was some mild mild right wrist tenderness. He denies any chest abdomen back or other extremity pain. The pain is rated at 4/10 it is located just to the area of swelling around the left eye facial cheek.  Past Medical History  Diagnosis Date  . Anemia   . Hyperlipidemia   . Hypertension   . Allergy   . Myocardial infarction   . Ulcer   . GERD (gastroesophageal reflux disease)   . Foot pain     left  . Restless leg syndrome   . Hypothyroid   . Headache   . Obstructive sleep apnea   . Fatigue   . CAD (coronary artery disease)   . Gastritis   . Bronchitis   . Back pain     low  . Diverticulosis   . Colon polyps     Past Surgical History  Procedure Date  . Appendectomy   . Hernia repair   . Tonsillectomy   . Ankle surgery     left     Family History  Problem Relation Age of Onset  . Heart disease Father     History  Substance  Use Topics  . Smoking status: Former Smoker -- 2.0 packs/day  . Smokeless tobacco: Never Used  . Alcohol Use: No      Review of Systems  Constitutional: Negative for fever and fatigue.  HENT: Positive for neck stiffness. Negative for nosebleeds and congestion.   Eyes: Negative for visual disturbance.  Respiratory: Negative for shortness of breath.   Cardiovascular: Negative for chest pain and palpitations.  Gastrointestinal: Negative for nausea, vomiting, abdominal pain and diarrhea.  Genitourinary: Negative for dysuria and hematuria.  Musculoskeletal: Negative for back pain.  Neurological: Negative for headaches.  Hematological: Does not bruise/bleed easily.    Allergies  Lipitor; Metoprolol tartrate; Shellfish allergy; Simvastatin; Trazodone and nefazodone; and Zolpidem  Home Medications   Current Outpatient Rx  Name Route Sig Dispense Refill  . AMLODIPINE BESYLATE 5 MG PO TABS Oral Take 1 tablet (5 mg total) by mouth daily. 30 tablet 11  . AMOXICILLIN-POT CLAVULANATE 875-125 MG PO TABS Oral Take 1 tablet by mouth 2 (two) times daily. daily 20 tablet 0  . VITAMIN C 1000 MG PO TABS Oral Take 1,000 mg by mouth daily.      . ASPIRIN 81 MG PO TABS Oral Take 81 mg by mouth daily.      [name]  CALCIUM MAGNESIUM PO Oral Take 1 tablet by mouth daily.      [name] CASANTHRANOL-DOCUSATE SODIUM 30-100 MG PO CAPS Oral Take 1 capsule by mouth as needed. For constipation    . CLONAZEPAM 0.5 MG PO TABS Oral Take 1 tablet (0.5 mg total) by mouth 3 (three) times daily as needed for anxiety. 270 tablet 1  . CYCLOBENZAPRINE HCL 10 MG PO TABS  TAKE 1 TABLET 3 TIMES A DAY AS NEEDED FOR SPASMS 60 tablet 5  . DICLOFENAC SODIUM 1 % TD GEL Topical Apply 1 application topically 4 (four) times daily. 5 Tube 10  . FERROUS SULFATE 325 (65 FE) MG PO TABS Oral Take 325 mg by mouth 2 (two) times daily.      . OMEGA-3 FATTY ACIDS 1000 MG PO CAPS Oral Take 2 g by mouth daily.      [name] FLUTICASONE PROPIONATE 50 MCG/ACT NA  SUSP Nasal Place 2 sprays into the nose daily. 16 g 2  . FUROSEMIDE 40 MG PO TABS Oral Take 20 mg by mouth every other day.      [name] HYDROCODONE-ACETAMINOPHEN 5-500 MG PO TABS Oral Take 1 tablet by mouth every 6 (six) hours as needed. 120 tablet 5  . LEVOTHYROXINE SODIUM 150 MCG PO TABS  TAKE 1 TABLET BY MOUTH EVERY DAY 30 tablet 10  . ONE-DAILY MULTI VITAMINS PO TABS Oral Take 1 tablet by mouth daily.      [name] NITROGLYCERIN 0.4 MG SL SUBL Sublingual Place 0.4 mg under the tongue every 5 (five) minutes as needed. For chest pain     . OMEPRAZOLE 20 MG PO CPDR  TAKE 1 CAPSULE BY MOUTH EVERY DAY 90 capsule 2  . OXYCODONE-ACETAMINOPHEN 10-650 MG PO TABS  As needed    . ROPINIROLE HCL 2 MG PO TABS Oral Take 2 mg by mouth at bedtime. Take 2 at bedtime     . HYDROCODONE-ACETAMINOPHEN 5-325 MG PO TABS Oral Take 1-2 tablets by mouth every 6 (six) hours as needed for pain. 15 tablet 0    BP 127/79  Pulse 83  Temp(Src) 97.6 F (36.4 C) (Oral)  Resp 20  SpO2 93%  Physical Exam  Nursing note and vitals reviewed. Constitutional: He is oriented to person, place, and time. He appears well-developed and well-nourished. No distress.  HENT:  Head: Normocephalic and atraumatic.  Mouth/Throat: Oropharynx is clear and moist.       Except for a marked contusion and swelling around the left thigh and facial cheek area.   Eyes: EOM are normal. Pupils are equal, round, and reactive to light.       Vision is intact in the left eye there is no hyphema  Neck: Normal range of motion. Neck supple.  Cardiovascular: Normal rate, regular rhythm, normal heart sounds and intact distal pulses.   No murmur heard. Pulmonary/Chest: Effort normal and breath sounds normal. He exhibits no tenderness.  Abdominal: Soft. Bowel sounds are normal. There is no tenderness.  Musculoskeletal: Normal range of motion. He exhibits tenderness. He exhibits no edema.       Mild right wrist tenderness no snuffbox tenderness no swelling full  range of motion neurocirculatory is intact distally  Neurological: He is alert and oriented to person, place, and time. No cranial nerve deficit. He exhibits normal muscle tone. Coordination normal.  Skin: Skin is warm and dry. No rash noted.    ED Course  Procedures (including critical care time)  Labs Reviewed - No data to display Dg  Wrist Complete Right  06/19/2011  *RADIOLOGY REPORT*  Clinical Data: Fall with right wrist injury.  RIGHT WRIST - COMPLETE 3+ VIEW  Comparison: None.  Findings: No evidence of acute fracture or dislocation.  Mild degenerative changes are present involving carpal bones.  Soft tissues are unremarkable.  IMPRESSION: No acute fracture.  Original Report Authenticated By: Reola Calkins, M.D.   Ct Head Wo Contrast  06/19/2011  *RADIOLOGY REPORT*  Clinical Data:  Larey Seat.  Facial trauma.  CT HEAD WITHOUT CONTRAST CT MAXILLOFACIAL WITHOUT CONTRAST CT CERVICAL SPINE WITHOUT CONTRAST  Technique:  Multidetector CT imaging of the head, cervical spine, and maxillofacial structures were performed using the standard protocol without intravenous contrast. Multiplanar CT image reconstructions of the cervical spine and maxillofacial structures were also generated.  Comparison:  None  CT HEAD  Findings: There is marked left periorbital soft tissue swelling. The left globe is intact.  The ventricles are normal.  No extra-axial fluid collections are seen.  The brainstem and cerebellum are unremarkable.  No acute intracranial findings such as infarction or hemorrhage.  No mass lesions.  The bony calvarium is intact.  The visualized paranasal sinuses and mastoid air cells are clear.  Dolichoectatic basilar artery is noted.  IMPRESSION: No acute intracranial findings or skull fracture.  CT MAXILLOFACIAL  Findings:  There is marked left periorbital soft tissue swelling/hematoma.  Underlying globe is intact.  No orbital fractures are identified.  There is also a moderate subcutaneous hematoma  overlying the left maxillary sinus.  The maxillary sinus walls are intact.  No acute facial bone fractures.  The paranasal sinuses and mastoid air cells are clear except for mild mucoperiosteal thickening involving the left maxillary and a left sphenoid sinus.  Marked deviation of the bony nasal septum rightward but no fracture.  The mandibular condyles are normally located.  No mandible fracture.  IMPRESSION: Large subcutaneous hematomas overlying the left orbit and left maxillary sinus but no definite underlying facial bone fractures.  CT CERVICAL SPINE  Findings:   Advanced degenerative cervical spondylosis with significant disc disease and facet disease.  No definite acute fracture.  No abnormal prevertebral soft tissue swelling.  The facets are normally aligned.  No facet or laminar fractures.  The skull base C1 and C1-2 articulations are maintained.  The dens is intact.  Mild multilevel foraminal encroachment due to uncinate spurring and facet disease.  No large disc protrusions.  The lung apices are clear.  IMPRESSION:  1.  Advanced degenerative cervical spondylosis with significant disc disease and facet disease. 2.  No acute fracture. 3.  Multilevel foraminal stenosis due to uncinate spurring and facet disease.  Original Report Authenticated By: P. Loralie Champagne, M.D.   Ct Cervical Spine Wo Contrast  06/19/2011  *RADIOLOGY REPORT*  Clinical Data:  Larey Seat.  Facial trauma.  CT HEAD WITHOUT CONTRAST CT MAXILLOFACIAL WITHOUT CONTRAST CT CERVICAL SPINE WITHOUT CONTRAST  Technique:  Multidetector CT imaging of the head, cervical spine, and maxillofacial structures were performed using the standard protocol without intravenous contrast. Multiplanar CT image reconstructions of the cervical spine and maxillofacial structures were also generated.  Comparison:  None  CT HEAD  Findings: There is marked left periorbital soft tissue swelling. The left globe is intact.  The ventricles are normal.  No extra-axial fluid  collections are seen.  The brainstem and cerebellum are unremarkable.  No acute intracranial findings such as infarction or hemorrhage.  No mass lesions.  The bony calvarium is intact.  The visualized paranasal sinuses  and mastoid air cells are clear.  Dolichoectatic basilar artery is noted.  IMPRESSION: No acute intracranial findings or skull fracture.  CT MAXILLOFACIAL  Findings:  There is marked left periorbital soft tissue swelling/hematoma.  Underlying globe is intact.  No orbital fractures are identified.  There is also a moderate subcutaneous hematoma overlying the left maxillary sinus.  The maxillary sinus walls are intact.  No acute facial bone fractures.  The paranasal sinuses and mastoid air cells are clear except for mild mucoperiosteal thickening involving the left maxillary and a left sphenoid sinus.  Marked deviation of the bony nasal septum rightward but no fracture.  The mandibular condyles are normally located.  No mandible fracture.  IMPRESSION: Large subcutaneous hematomas overlying the left orbit and left maxillary sinus but no definite underlying facial bone fractures.  CT CERVICAL SPINE  Findings:   Advanced degenerative cervical spondylosis with significant disc disease and facet disease.  No definite acute fracture.  No abnormal prevertebral soft tissue swelling.  The facets are normally aligned.  No facet or laminar fractures.  The skull base C1 and C1-2 articulations are maintained.  The dens is intact.  Mild multilevel foraminal encroachment due to uncinate spurring and facet disease.  No large disc protrusions.  The lung apices are clear.  IMPRESSION:  1.  Advanced degenerative cervical spondylosis with significant disc disease and facet disease. 2.  No acute fracture. 3.  Multilevel foraminal stenosis due to uncinate spurring and facet disease.  Original Report Authenticated By: P. Loralie Champagne, M.D.   Ct Maxillofacial Wo Cm  06/19/2011  *RADIOLOGY REPORT*  Clinical Data:  Larey Seat.   Facial trauma.  CT HEAD WITHOUT CONTRAST CT MAXILLOFACIAL WITHOUT CONTRAST CT CERVICAL SPINE WITHOUT CONTRAST  Technique:  Multidetector CT imaging of the head, cervical spine, and maxillofacial structures were performed using the standard protocol without intravenous contrast. Multiplanar CT image reconstructions of the cervical spine and maxillofacial structures were also generated.  Comparison:  None  CT HEAD  Findings: There is marked left periorbital soft tissue swelling. The left globe is intact.  The ventricles are normal.  No extra-axial fluid collections are seen.  The brainstem and cerebellum are unremarkable.  No acute intracranial findings such as infarction or hemorrhage.  No mass lesions.  The bony calvarium is intact.  The visualized paranasal sinuses and mastoid air cells are clear.  Dolichoectatic basilar artery is noted.  IMPRESSION: No acute intracranial findings or skull fracture.  CT MAXILLOFACIAL  Findings:  There is marked left periorbital soft tissue swelling/hematoma.  Underlying globe is intact.  No orbital fractures are identified.  There is also a moderate subcutaneous hematoma overlying the left maxillary sinus.  The maxillary sinus walls are intact.  No acute facial bone fractures.  The paranasal sinuses and mastoid air cells are clear except for mild mucoperiosteal thickening involving the left maxillary and a left sphenoid sinus.  Marked deviation of the bony nasal septum rightward but no fracture.  The mandibular condyles are normally located.  No mandible fracture.  IMPRESSION: Large subcutaneous hematomas overlying the left orbit and left maxillary sinus but no definite underlying facial bone fractures.  CT CERVICAL SPINE  Findings:   Advanced degenerative cervical spondylosis with significant disc disease and facet disease.  No definite acute fracture.  No abnormal prevertebral soft tissue swelling.  The facets are normally aligned.  No facet or laminar fractures.  The skull base  C1 and C1-2 articulations are maintained.  The dens is intact.  Mild multilevel  foraminal encroachment due to uncinate spurring and facet disease.  No large disc protrusions.  The lung apices are clear.  IMPRESSION:  1.  Advanced degenerative cervical spondylosis with significant disc disease and facet disease. 2.  No acute fracture. 3.  Multilevel foraminal stenosis due to uncinate spurring and facet disease.  Original Report Authenticated By: P. Loralie Champagne, M.D.     1. Facial contusion       MDM  Status post fall no syncope questionable loss of consciousness. Patient's head CT facial CT and neck CT negative, despite marked left periorbital and cheek swelling of the left side of the face no underlying fractures or sinus blood. Patient also with mild right wrist discomfort no snuffbox tenderness x-rays at that are negative this probably a very mild contusion or sprain. Patient is stable in the emergency department and is ready for discharge. Patient is to taking an aspirin a day but adamantly denies being on any anti-coagulant type medication.        Shelda Jakes, MD 06/19/11 [phone]

## 2011-06-21 ENCOUNTER — Other Ambulatory Visit: Payer: Self-pay | Admitting: Family Medicine

## 2011-06-27 ENCOUNTER — Telehealth: Payer: Self-pay

## 2011-06-27 NOTE — Telephone Encounter (Signed)
Pt fell and hit is face last Thursday.  Pt went to ER and had x-rays done. Pt's swelling under his eye has gone down but now he has knot under his eye that is hard.  Pt has been putting compresses on it and it has not gotten better.  Pt has an appt for 06/28/11.

## 2011-06-28 ENCOUNTER — Telehealth: Payer: Self-pay | Admitting: Family Medicine

## 2011-06-28 ENCOUNTER — Ambulatory Visit: Payer: Medicare Other | Admitting: Family Medicine

## 2011-06-28 NOTE — Telephone Encounter (Signed)
Open in error

## 2011-06-30 ENCOUNTER — Other Ambulatory Visit: Payer: Self-pay | Admitting: Family Medicine

## 2011-07-31 ENCOUNTER — Ambulatory Visit (INDEPENDENT_AMBULATORY_CARE_PROVIDER_SITE_OTHER): Payer: Medicare Other | Admitting: Family Medicine

## 2011-07-31 ENCOUNTER — Encounter: Payer: Self-pay | Admitting: Family Medicine

## 2011-07-31 VITALS — BP 128/84 | HR 78 | Temp 98.4°F | Ht 70.5 in | Wt 232.0 lb

## 2011-07-31 DIAGNOSIS — N401 Enlarged prostate with lower urinary tract symptoms: Secondary | ICD-10-CM

## 2011-07-31 DIAGNOSIS — R7309 Other abnormal glucose: Secondary | ICD-10-CM

## 2011-07-31 DIAGNOSIS — I2510 Atherosclerotic heart disease of native coronary artery without angina pectoris: Secondary | ICD-10-CM

## 2011-07-31 DIAGNOSIS — Z860100 Personal history of colon polyps, unspecified: Secondary | ICD-10-CM

## 2011-07-31 DIAGNOSIS — I10 Essential (primary) hypertension: Secondary | ICD-10-CM

## 2011-07-31 DIAGNOSIS — N139 Obstructive and reflux uropathy, unspecified: Secondary | ICD-10-CM

## 2011-07-31 DIAGNOSIS — R739 Hyperglycemia, unspecified: Secondary | ICD-10-CM

## 2011-07-31 DIAGNOSIS — Z23 Encounter for immunization: Secondary | ICD-10-CM

## 2011-07-31 DIAGNOSIS — L259 Unspecified contact dermatitis, unspecified cause: Secondary | ICD-10-CM

## 2011-07-31 DIAGNOSIS — E785 Hyperlipidemia, unspecified: Secondary | ICD-10-CM

## 2011-07-31 DIAGNOSIS — N138 Other obstructive and reflux uropathy: Secondary | ICD-10-CM

## 2011-07-31 DIAGNOSIS — Z0000 Encounter for general adult medical examination without abnormal findings: Secondary | ICD-10-CM

## 2011-07-31 DIAGNOSIS — L309 Dermatitis, unspecified: Secondary | ICD-10-CM

## 2011-07-31 LAB — LIPID PANEL
Cholesterol: 176 mg/dL (ref 0–200)
Total CHOL/HDL Ratio: 4
Triglycerides: 148 mg/dL (ref 0.0–149.0)

## 2011-07-31 LAB — CBC WITH DIFFERENTIAL/PLATELET
Basophils Absolute: 0.1 10*3/uL (ref 0.0–0.1)
Eosinophils Absolute: 0.5 10*3/uL (ref 0.0–0.7)
HCT: 47.7 % (ref 39.0–52.0)
Hemoglobin: 16.1 g/dL (ref 13.0–17.0)
Lymphs Abs: 2.8 10*3/uL (ref 0.7–4.0)
MCHC: 33.7 g/dL (ref 30.0–36.0)
MCV: 93.4 fl (ref 78.0–100.0)
Monocytes Absolute: 1 10*3/uL (ref 0.1–1.0)
Neutro Abs: 5.1 10*3/uL (ref 1.4–7.7)
RDW: 14.4 % (ref 11.5–14.6)

## 2011-07-31 LAB — BASIC METABOLIC PANEL
BUN: 14 mg/dL (ref 6–23)
CO2: 31 mEq/L (ref 19–32)
Chloride: 105 mEq/L (ref 96–112)
Creatinine, Ser: 1 mg/dL (ref 0.4–1.5)

## 2011-07-31 LAB — POCT URINALYSIS DIPSTICK
Ketones, UA: NEGATIVE
Leukocytes, UA: NEGATIVE
Protein, UA: NEGATIVE
Spec Grav, UA: >=1.03
pH, UA: 5.5

## 2011-07-31 LAB — HEPATIC FUNCTION PANEL
ALT: 27 U/L (ref 0–53)
AST: 25 U/L (ref 0–37)
Total Bilirubin: 0.9 mg/dL (ref 0.3–1.2)

## 2011-07-31 MED ORDER — CLONAZEPAM 0.5 MG PO TABS: 0.5000 mg | ORAL_TABLET | Freq: Three times a day (TID) | ORAL | Status: DC | PRN

## 2011-07-31 MED ORDER — HALOBETASOL PROPIONATE 0.05 % EX CREA: TOPICAL_CREAM | Freq: Two times a day (BID) | CUTANEOUS | Status: AC

## 2011-07-31 MED ORDER — OXYCODONE-ACETAMINOPHEN 10-650 MG PO TABS: 1.0000 | ORAL_TABLET | Freq: Four times a day (QID) | ORAL | Status: DC | PRN

## 2011-07-31 NOTE — Progress Notes (Signed)
  Subjective:    Patient ID: [name], male    DOB: 26-May-1941, 71 y.o.   MRN: [id number]  HPI 71 yr old male for a cpx. He is doing well in general. He has some patches of itchy eczema on the legs which are not responding to OTC cortisone creams. He is past due for a colonoscopy and he is past due for a Cardiology follow up. He still struggles with leg and back pains.    Review of Systems  Constitutional: Negative.   HENT: Negative.   Eyes: Negative.   Respiratory: Negative.   Cardiovascular: Negative.   Gastrointestinal: Negative.   Genitourinary: Negative.   Musculoskeletal: Negative.   Skin: Negative.   Neurological: Negative.   Hematological: Negative.   Psychiatric/Behavioral: Negative.        Objective:   Physical Exam  Constitutional: He is oriented to person, place, and time. He appears well-developed and well-nourished. No distress.  HENT:  Head: Normocephalic and atraumatic.  Right Ear: External ear normal.  Left Ear: External ear normal.  Nose: Nose normal.  Mouth/Throat: Oropharynx is clear and moist. No oropharyngeal exudate.  Eyes: Conjunctivae and EOM are normal. Pupils are equal, round, and reactive to light. Right eye exhibits no discharge. Left eye exhibits no discharge. No scleral icterus.  Neck: Neck supple. No JVD present. No tracheal deviation present. No thyromegaly present.  Cardiovascular: Normal rate, regular rhythm, normal heart sounds and intact distal pulses.  Exam reveals no gallop and no friction rub.   No murmur heard.      EKG normal  Pulmonary/Chest: Effort normal and breath sounds normal. No respiratory distress. He has no wheezes. He has no rales. He exhibits no tenderness.  Abdominal: Soft. Bowel sounds are normal. He exhibits no distension and no mass. There is no tenderness. There is no rebound and no guarding.  Genitourinary: Rectum normal, prostate normal and penis normal. Guaiac negative stool. No penile tenderness.    Musculoskeletal: Normal range of motion. He exhibits no edema and no tenderness.  Lymphadenopathy:    He has no cervical adenopathy.  Neurological: He is alert and oriented to person, place, and time. He has normal reflexes. No cranial nerve deficit. He exhibits normal muscle tone. Coordination normal.  Skin: Skin is warm and dry. No rash noted. He is not diaphoretic. No erythema. No pallor.  Psychiatric: He has a normal mood and affect. His behavior is normal. Judgment and thought content normal.          Assessment & Plan:  Well exam. Get fasting labs today. Try Halobetasol cream for the eczema. He will call Dr. Arlyce Dice for another colonoscopy, and he will call Dr. Dicie Beam for a Cardiology visit.

## 2011-08-01 ENCOUNTER — Encounter: Payer: Self-pay | Admitting: Family Medicine

## 2011-08-06 ENCOUNTER — Encounter: Payer: Self-pay | Admitting: Family Medicine

## 2011-08-06 NOTE — Progress Notes (Signed)
Quick Note:  Spoke with pt and put a copy of results in mail. ______ 

## 2011-09-17 ENCOUNTER — Encounter: Payer: Self-pay | Admitting: Family Medicine

## 2011-09-17 ENCOUNTER — Ambulatory Visit (INDEPENDENT_AMBULATORY_CARE_PROVIDER_SITE_OTHER): Payer: Medicare Other | Admitting: Family Medicine

## 2011-09-17 VITALS — BP 134/82 | HR 83 | Temp 97.6°F | Wt 230.0 lb

## 2011-09-17 DIAGNOSIS — J40 Bronchitis, not specified as acute or chronic: Secondary | ICD-10-CM

## 2011-09-17 MED ORDER — AMOXICILLIN-POT CLAVULANATE 875-125 MG PO TABS: 1.0000 | ORAL_TABLET | Freq: Two times a day (BID) | ORAL | Status: DC

## 2011-09-17 MED ORDER — NITROGLYCERIN 0.4 MG SL SUBL: 0.4000 mg | SUBLINGUAL_TABLET | SUBLINGUAL | Status: DC | PRN

## 2011-09-17 MED ORDER — OXYCODONE-ACETAMINOPHEN 10-650 MG PO TABS: 1.0000 | ORAL_TABLET | Freq: Four times a day (QID) | ORAL | Status: DC | PRN

## 2011-09-17 MED ORDER — CEFTRIAXONE SODIUM 1 G IJ SOLR
1.0000 g | Freq: Once | INTRAMUSCULAR | Status: AC
  Administered 2011-09-17: 1 g via INTRAMUSCULAR

## 2011-09-17 NOTE — Progress Notes (Signed)
  Subjective:    Patient ID: [name], male    DOB: September 13, 1940, 71 y.o.   MRN: [id number]  HPI Here for one week of chest tightness and coughing up green sputum. No chest pain or fever.    Review of Systems  Constitutional: Negative.   HENT: Positive for congestion and postnasal drip.   Eyes: Negative.   Respiratory: Positive for cough, chest tightness and shortness of breath.   Cardiovascular: Negative.        Objective:   Physical Exam  Constitutional: He appears well-developed and well-nourished.  HENT:  Right Ear: External ear normal.  Left Ear: External ear normal.  Nose: Nose normal.  Mouth/Throat: Oropharynx is clear and moist. No oropharyngeal exudate.  Eyes: Conjunctivae are normal.  Neck: No thyromegaly present.  Pulmonary/Chest: Effort normal. He has no rales.       Scattered rhonchi and wheezes  Lymphadenopathy:    He has no cervical adenopathy.          Assessment & Plan:  Recheck prn

## 2011-09-18 ENCOUNTER — Other Ambulatory Visit: Payer: Self-pay | Admitting: Family Medicine

## 2011-09-19 NOTE — Telephone Encounter (Signed)
Rx sent to pharmacy   

## 2011-09-25 ENCOUNTER — Encounter: Payer: Self-pay | Admitting: Family Medicine

## 2011-09-25 ENCOUNTER — Ambulatory Visit (INDEPENDENT_AMBULATORY_CARE_PROVIDER_SITE_OTHER): Payer: Medicare Other | Admitting: Family Medicine

## 2011-09-25 VITALS — BP 130/84 | HR 82 | Temp 98.2°F | Wt 232.0 lb

## 2011-09-25 DIAGNOSIS — T887XXA Unspecified adverse effect of drug or medicament, initial encounter: Secondary | ICD-10-CM

## 2011-09-25 DIAGNOSIS — T50905A Adverse effect of unspecified drugs, medicaments and biological substances, initial encounter: Secondary | ICD-10-CM

## 2011-09-25 MED ORDER — METHYLPREDNISOLONE ACETATE 80 MG/ML IJ SUSP
120.0000 mg | Freq: Once | INTRAMUSCULAR | Status: AC
  Administered 2011-09-25: 120 mg via INTRAMUSCULAR

## 2011-09-25 NOTE — Progress Notes (Signed)
  Subjective:    Patient ID: [name], male    DOB: 1940-11-21, 71 y.o.   MRN: [id number]  HPI Here for a possible medication reaction. He was here on 09-17-11 for a bronchitis, and we gave him Augmentin. He has had this before with no problems. However on the 3rd day of taking it he began to have an itchy rash all over his body except for the face. He continued to take this until he took the last dose last night. In the meantime the rash has steadily gotten worse. No SOB or tongue swelling.    Review of Systems  Constitutional: Negative.   Respiratory: Negative.   Cardiovascular: Negative.   Skin: Positive for rash.       Objective:   Physical Exam  Constitutional: He appears well-developed and well-nourished.  Cardiovascular: Normal rate, regular rhythm, normal heart sounds and intact distal pulses.   Pulmonary/Chest: Effort normal and breath sounds normal.  Skin:       Widespread red macular rash as above          Assessment & Plan:  This is an allergic reaction to Augmentin. Given a steroid shot. Use Aveeno soaks in the bathtub. Use Benadryl prn

## 2011-09-25 NOTE — Progress Notes (Signed)
Addended by: Aniceto Boss A on: 09/25/2011 01:05 PM   Modules accepted: Orders

## 2011-09-26 ENCOUNTER — Ambulatory Visit: Payer: Medicare Other | Admitting: Family Medicine

## 2011-10-25 ENCOUNTER — Other Ambulatory Visit: Payer: Self-pay | Admitting: Family Medicine

## 2011-11-15 ENCOUNTER — Other Ambulatory Visit: Payer: Self-pay | Admitting: Family Medicine

## 2011-11-22 ENCOUNTER — Telehealth: Payer: Self-pay | Admitting: Family Medicine

## 2011-11-22 MED ORDER — OXYCODONE-ACETAMINOPHEN 10-650 MG PO TABS: 1.0000 | ORAL_TABLET | Freq: Four times a day (QID) | ORAL | Status: DC | PRN

## 2011-11-22 NOTE — Telephone Encounter (Signed)
Pt requesting refill on oxyCODONE-acetaminophen (PERCOCET) 10-650 MG per tablet  ° °

## 2011-11-22 NOTE — Telephone Encounter (Signed)
Script is ready for pick up and left voice message. 

## 2011-11-22 NOTE — Telephone Encounter (Signed)
done

## 2011-12-31 ENCOUNTER — Telehealth: Payer: Self-pay | Admitting: Family Medicine

## 2011-12-31 NOTE — Telephone Encounter (Signed)
Refill request for Omeprazole 20 mg and pt stated that he is taking this bid. Can we refill this?

## 2012-01-01 MED ORDER — OMEPRAZOLE 20 MG PO CPDR: 20.0000 mg | DELAYED_RELEASE_CAPSULE | Freq: Two times a day (BID) | ORAL | Status: DC

## 2012-01-01 NOTE — Telephone Encounter (Signed)
Call in #[phone] with 11 rf 

## 2012-01-01 NOTE — Telephone Encounter (Signed)
I sent script e-scribe and pt did request a 90 day supply.

## 2012-01-14 ENCOUNTER — Telehealth: Payer: Self-pay | Admitting: Family Medicine

## 2012-01-14 NOTE — Telephone Encounter (Signed)
Pt calling requesting refill on Oxycodone. Please call when Rx is ready for pick up.

## 2012-01-15 MED ORDER — OXYCODONE-ACETAMINOPHEN 10-650 MG PO TABS: 1.0000 | ORAL_TABLET | Freq: Four times a day (QID) | ORAL | Status: DC | PRN

## 2012-01-15 NOTE — Telephone Encounter (Signed)
Script is ready for pick up and I spoke with pt.  

## 2012-01-15 NOTE — Telephone Encounter (Signed)
done

## 2012-01-28 ENCOUNTER — Telehealth: Payer: Self-pay | Admitting: Family Medicine

## 2012-01-28 NOTE — Telephone Encounter (Signed)
Refill request for Clonazepam 0.5 mg take 1 po tid prn and pt last here on 09/25/11.

## 2012-01-29 MED ORDER — CLONAZEPAM 0.5 MG PO TABS: 0.5000 mg | ORAL_TABLET | Freq: Three times a day (TID) | ORAL | Status: DC | PRN

## 2012-01-29 NOTE — Telephone Encounter (Signed)
Call in #[phone] with one rf 

## 2012-01-29 NOTE — Telephone Encounter (Signed)
I called in script 

## 2012-01-31 ENCOUNTER — Other Ambulatory Visit: Payer: Self-pay | Admitting: Family Medicine

## 2012-02-07 ENCOUNTER — Telehealth: Payer: Self-pay | Admitting: Family Medicine

## 2012-02-07 NOTE — Telephone Encounter (Signed)
Pt needs a letter stating he is permanently disable due heart attack and foot surgery.

## 2012-02-08 ENCOUNTER — Telehealth: Payer: Self-pay | Admitting: Family Medicine

## 2012-02-08 MED ORDER — HYDROCODONE-ACETAMINOPHEN 5-500 MG PO TABS: 1.0000 | ORAL_TABLET | Freq: Four times a day (QID) | ORAL | Status: DC | PRN

## 2012-02-08 NOTE — Telephone Encounter (Signed)
I spoke with pt and he is trying to get a loan modification on his house to reduce the monthly payments.

## 2012-02-08 NOTE — Telephone Encounter (Signed)
Refill request for Hydrocodon-Acetaminophen 5-500 mg take 1 po qid prn and last here on 09/25/11.

## 2012-02-08 NOTE — Telephone Encounter (Signed)
I called in script 

## 2012-02-08 NOTE — Telephone Encounter (Signed)
I need more info than that. Who is this for? Is this to update a document we did in the past?

## 2012-02-08 NOTE — Telephone Encounter (Signed)
Call in #120 with 5 rf 

## 2012-02-12 NOTE — Telephone Encounter (Signed)
Note is ready and I spoke with pt.  

## 2012-02-12 NOTE — Telephone Encounter (Signed)
Done, the note is in your box

## 2012-02-13 ENCOUNTER — Other Ambulatory Visit: Payer: Self-pay | Admitting: Family Medicine

## 2012-02-26 ENCOUNTER — Encounter: Payer: Self-pay | Admitting: Cardiovascular Disease

## 2012-02-26 ENCOUNTER — Ambulatory Visit (INDEPENDENT_AMBULATORY_CARE_PROVIDER_SITE_OTHER): Payer: Medicare Other | Admitting: Cardiovascular Disease

## 2012-02-26 VITALS — BP 123/82 | HR 69 | Ht 70.0 in | Wt 221.0 lb

## 2012-02-26 DIAGNOSIS — M79606 Pain in leg, unspecified: Secondary | ICD-10-CM | POA: Insufficient documentation

## 2012-02-26 DIAGNOSIS — I2510 Atherosclerotic heart disease of native coronary artery without angina pectoris: Secondary | ICD-10-CM

## 2012-02-26 DIAGNOSIS — M79609 Pain in unspecified limb: Secondary | ICD-10-CM

## 2012-02-26 MED ORDER — NITROGLYCERIN 0.4 MG SL SUBL: 0.4000 mg | SUBLINGUAL_TABLET | SUBLINGUAL | Status: DC | PRN

## 2012-02-26 NOTE — Progress Notes (Signed)
History of Present Illness: 71 yo WM with history of CAD, HTN, HLD, OSA, bradycardia here today for cardiac follow up. He has been followed in the past by Dr. Juanda Chance. Catheterization in 2009 showed nonobstructive CAD (50% LAD stenosis, 40% RCA stenosis). October 2011 he was admitted from the GI office was in acute GI bleeding. He required transfusions. He was seen by Dr. Clent Ridges on 05/23/11 and was started on lasix for lower extremity edema and SOB. His breathing was much better on lasix. His lower extremity edema resolved. Echocardiogram November 2012 showed normal LV function and normal wall motion. He does not tolerate statins.   He has occasional chest pains. This is usually several hours after lifting heavy objects. This is unchanged over the last year.  He gets easily SOB with strenuous activities. He has not had a previous pulmonary workup. He has 100 pack years of tobacco abuse but stopped smoking 2009.    Primary Care Physician: Gershon Crane  Last Lipid Profile:  Lipid Panel     Component Value Date/Time   CHOL 176 07/31/2011 1040   TRIG 148.0 07/31/2011 1040   HDL 39.90 07/31/2011 1040   CHOLHDL 4 07/31/2011 1040   VLDL 29.6 07/31/2011 1040   LDLCALC 107* 07/31/2011 1040     Past Medical History  Diagnosis Date  . Anemia   . Hyperlipidemia   . Hypertension   . Allergy   . Myocardial infarction   . Ulcer   . GERD (gastroesophageal reflux disease)   . Foot pain     left  . Restless leg syndrome   . Hypothyroid   . Headache   . Obstructive sleep apnea   . Fatigue   . CAD (coronary artery disease)   . Gastritis   . Bronchitis   . Back pain     low  . Diverticulosis   . Colon polyps     Past Surgical History  Procedure Date  . Appendectomy   . Hernia repair   . Tonsillectomy   . Ankle surgery     left   . Colonoscopy 08-23-05    per Dr. Arlyce Dice, adenomatous polyps, repeat in 5 yrs   . Esophagogastroduodenoscopy 08-23-05    per Dr. Arlyce Dice, cauterized jejunal AVMs      Current Outpatient Prescriptions  Medication Sig Dispense Refill  . amLODipine (NORVASC) 5 MG tablet TAKE 1 TABLET BY MOUTH EVERY DAY  30 tablet  8  . Ascorbic Acid (VITAMIN C) 1000 MG tablet Take 1,000 mg by mouth daily.        [name] aspirin 81 MG tablet Take 81 mg by mouth daily.        . Calcium-Magnesium-Vitamin D (CALCIUM MAGNESIUM PO) Take 1 tablet by mouth daily.        Jennette Banker Sodium 30-100 MG CAPS Take 1 capsule by mouth as needed. For constipation      . clonazePAM (KLONOPIN) 0.5 MG tablet Take 1 tablet (0.5 mg total) by mouth 3 (three) times daily as needed for anxiety.  270 tablet  1  . cyclobenzaprine (FLEXERIL) 10 MG tablet TAKE 1 TABLET 3 TIMES A DAY AS NEEDED FOR SPASMS  60 tablet  5  . diclofenac sodium (VOLTAREN) 1 % GEL Apply 1 application topically 4 (four) times daily.  5 Tube  10  . ferrous sulfate 325 (65 FE) MG tablet Take 325 mg by mouth daily with breakfast.       . fish oil-omega-3 fatty acids 1000 MG capsule  Take 2 g by mouth daily.        . fluticasone (FLONASE) 50 MCG/ACT nasal spray Place 2 sprays into the nose daily.  16 g  2  . halobetasol (ULTRAVATE) 0.05 % cream Apply topically 2 (two) times daily.  50 g  5  . HYDROcodone-acetaminophen (VICODIN) 5-500 MG per tablet Take 1 tablet by mouth every 6 (six) hours as needed.  120 tablet  5  . levothyroxine (SYNTHROID, LEVOTHROID) 150 MCG tablet TAKE 1 TABLET BY MOUTH EVERY DAY  30 tablet  10  . nitroGLYCERIN (NITROSTAT) 0.4 MG SL tablet Place 1 tablet (0.4 mg total) under the tongue every 5 (five) minutes as needed for chest pain. For chest pain  50 tablet  11  . omeprazole (PRILOSEC) 20 MG capsule Take 1 capsule (20 mg total) by mouth 2 (two) times daily.  180 capsule  3  . oxyCODONE-acetaminophen (PERCOCET) 10-650 MG per tablet Take 1 tablet by mouth every 6 (six) hours as needed for pain. As needed  120 tablet  0  . rOPINIRole (REQUIP) 2 MG tablet TAKE 2 TABLETS BY MOUTH AT BEDTIME  60 tablet  11     Allergies  Allergen Reactions  . Lipitor (Atorvastatin Calcium)     myalgias  . Metoprolol Tartrate   . Shellfish Allergy   . Simvastatin     myalgias  . Trazodone And Nefazodone     Unsteady on feet  . Zolpidem     Chest pain  . Amoxicillin-Pot Clavulanate Rash    History   Social History  . Marital Status: Married    Spouse Name: N/A    Number of Children: N/A  . Years of Education: N/A   Occupational History  . Not on file.   Social History Main Topics  . Smoking status: Former Smoker -- 2.0 packs/day  . Smokeless tobacco: Never Used  . Alcohol Use: No  . Drug Use: No  . Sexually Active: Not on file   Other Topics Concern  . Not on file   Social History Narrative  . No narrative on file    Family History  Problem Relation Age of Onset  . Heart disease Father     Review of Systems:  As stated in the HPI and otherwise negative.   BP 123/82  Pulse 78  Ht 5\' 10"  (1.778 m)  Wt 221 lb (100.245 kg)  BMI 31.71 kg/m2  Physical Examination: General: Well developed, well nourished, NAD HEENT: OP clear, mucus membranes moist SKIN: warm, dry. No rashes. Neuro: No focal deficits Musculoskeletal: Muscle strength 5/5 all ext Psychiatric: Mood and affect normal Neck: No JVD, no carotid bruits, no thyromegaly, no lymphadenopathy. Lungs:Clear bilaterally, no wheezes, rhonci, crackles Cardiovascular: Regular rate and rhythm. No murmurs, gallops or rubs. Abdomen:Soft. Bowel sounds present. Non-tender.  Extremities: No lower extremity edema. Pulses are 2 + in the bilateral PT. Trace bilateral DP  EKG:NSR, rate 69 bpm.   Cardiac cath February 2009:  Left main coronary artery: The left main coronary artery was  free of significant disease.  Left anterior descending artery: The left anterior descending artery  gave rise to 4 diagonal branches and 2 septal perforators. The LAD was  irregular and there was 30% proximal and 50% mid stenosis.  Circumflex artery:  The circumflex artery gave rise to a ramus branch,  marginal branch and posterolateral branch. These vessels were  irregular, but there was no significant obstruction.  Right coronary artery: The right coronary artery was  a moderate-sized  vessel and gave rise to a right ventricular branch, atrial branch, a  posterior descending branch and 4 posterolateral branches. There was  37% proximal and 40% mid stenosis and irregularities in the proximal,  mid and distal vessel.  Left ventriculogram: The left ventriculogram performed in the RAO  projection showed slight hypokinesis of the inferobasal wall. The  overall wall motion was good with an estimated ejection fraction of 55%.

## 2012-02-26 NOTE — Assessment & Plan Note (Signed)
He has resting pain in his legs. No swelling. Feet feel cold at night. I have offered non-invasive testing with [facility] but he does not wish to schedule at this time.

## 2012-02-26 NOTE — Patient Instructions (Addendum)
Your physician wants you to follow-up in:  12 months.  You will receive a reminder letter in the mail two months in advance. If you don't receive a letter, please call our office to schedule the follow-up appointment.   

## 2012-02-26 NOTE — Assessment & Plan Note (Addendum)
Stable. He is known to have moderate CAD by cath 2009. No change in chest pain or SOB. Continue current meds. He will call if his symptoms change.

## 2012-03-03 NOTE — Addendum Note (Signed)
Addended by: Burnett Kanaris A on: 03/03/2012 03:14 PM   Modules accepted: Orders

## 2012-03-11 ENCOUNTER — Other Ambulatory Visit: Payer: Self-pay | Admitting: Family Medicine

## 2012-03-11 NOTE — Telephone Encounter (Signed)
Pt needs new rx oxycodone °

## 2012-03-12 MED ORDER — OXYCODONE-ACETAMINOPHEN 10-650 MG PO TABS: 1.0000 | ORAL_TABLET | Freq: Four times a day (QID) | ORAL | Status: DC | PRN

## 2012-03-12 NOTE — Telephone Encounter (Signed)
done

## 2012-03-12 NOTE — Telephone Encounter (Signed)
Script is ready for pick up and I left message for pt.

## 2012-04-09 ENCOUNTER — Encounter: Payer: Self-pay | Admitting: Gastroenterology

## 2012-05-15 ENCOUNTER — Telehealth: Payer: Self-pay | Admitting: Family Medicine

## 2012-05-15 MED ORDER — OXYCODONE-ACETAMINOPHEN 10-650 MG PO TABS: 1.0000 | ORAL_TABLET | Freq: Four times a day (QID) | ORAL | Status: DC | PRN

## 2012-05-15 NOTE — Telephone Encounter (Signed)
Pt needs new rx oxycodone °

## 2012-05-15 NOTE — Telephone Encounter (Signed)
done

## 2012-06-03 ENCOUNTER — Other Ambulatory Visit: Payer: Self-pay | Admitting: Family Medicine

## 2012-07-08 ENCOUNTER — Other Ambulatory Visit: Payer: Self-pay | Admitting: Family Medicine

## 2012-07-08 MED ORDER — OXYCODONE-ACETAMINOPHEN 10-650 MG PO TABS: 1.0000 | ORAL_TABLET | Freq: Four times a day (QID) | ORAL | Status: DC | PRN

## 2012-07-08 NOTE — Telephone Encounter (Signed)
Pt needs new rx oxycodone 10-650 MG

## 2012-07-08 NOTE — Telephone Encounter (Signed)
Script is ready for pick up and I left a voice message.  

## 2012-07-08 NOTE — Telephone Encounter (Signed)
done

## 2012-07-21 ENCOUNTER — Ambulatory Visit (INDEPENDENT_AMBULATORY_CARE_PROVIDER_SITE_OTHER): Payer: Medicare Other | Admitting: Internal Medicine

## 2012-07-21 ENCOUNTER — Encounter: Payer: Self-pay | Admitting: Internal Medicine

## 2012-07-21 VITALS — BP 160/110 | HR 86 | Temp 98.0°F | Wt 221.0 lb

## 2012-07-21 DIAGNOSIS — J329 Chronic sinusitis, unspecified: Secondary | ICD-10-CM | POA: Insufficient documentation

## 2012-07-21 MED ORDER — CEFPODOXIME PROXETIL 200 MG PO TABS: 200.0000 mg | ORAL_TABLET | Freq: Two times a day (BID) | ORAL | Status: DC

## 2012-07-21 NOTE — Progress Notes (Signed)
Subjective:    Patient ID: [name], male    DOB: 02-Aug-1940, 71 y.o.   MRN: [id number]  HPI  71 year old white male with remote history of tobacco use complains of productive cough over the last 5 days. Symptoms started with nasal congestion. Patient reports no relief with over-the-counter cold preps. He quit smoking 9 years ago but is still exposed to secondhand smoke from his wife. He has mild shortness of breath.  Patient complains of sinus pressure bilaterally  Review of Systems Negative for fever or chills  Past Medical History  Diagnosis Date  . Anemia   . Hyperlipidemia   . Hypertension   . Allergy   . Myocardial infarction   . Ulcer   . GERD (gastroesophageal reflux disease)   . Foot pain     left  . Restless leg syndrome   . Hypothyroid   . Headache   . Obstructive sleep apnea   . Fatigue   . CAD (coronary artery disease)   . Gastritis   . Bronchitis   . Back pain     low  . Diverticulosis   . Colon polyps     History   Social History  . Marital Status: Married    Spouse Name: N/A    Number of Children: N/A  . Years of Education: N/A   Occupational History  . Disabled    Social History Main Topics  . Smoking status: Former Smoker -- 2.0 packs/day for 50 years    Types: Cigarettes    Quit date: 07/24/2007  . Smokeless tobacco: Never Used  . Alcohol Use: No  . Drug Use: No  . Sexually Active: Not on file   Other Topics Concern  . Not on file   Social History Narrative  . No narrative on file    Past Surgical History  Procedure Date  . Appendectomy   . Hernia repair   . Tonsillectomy   . Ankle surgery     left   . Colonoscopy 08-23-05    per Dr. Arlyce Dice, adenomatous polyps, repeat in 5 yrs   . Esophagogastroduodenoscopy 08-23-05    per Dr. Arlyce Dice, cauterized jejunal AVMs     Family History  Problem Relation Age of Onset  . Heart disease Father     Allergies  Allergen Reactions  . Lipitor (Atorvastatin Calcium)     myalgias    . Metoprolol Tartrate   . Shellfish Allergy   . Simvastatin     myalgias  . Trazodone And Nefazodone     Unsteady on feet  . Zolpidem     Chest pain  . Amoxicillin-Pot Clavulanate Rash    Current Outpatient Prescriptions on File Prior to Visit  Medication Sig Dispense Refill  . amLODipine (NORVASC) 5 MG tablet TAKE 1 TABLET BY MOUTH EVERY DAY  30 tablet  8  . Ascorbic Acid (VITAMIN C) 1000 MG tablet Take 1,000 mg by mouth daily.        [name] aspirin 81 MG tablet Take 81 mg by mouth daily.        . Calcium-Magnesium-Vitamin D (CALCIUM MAGNESIUM PO) Take 1 tablet by mouth daily.        Jennette Banker Sodium 30-100 MG CAPS Take 1 capsule by mouth as needed. For constipation      . clonazePAM (KLONOPIN) 0.5 MG tablet Take 1 tablet (0.5 mg total) by mouth 3 (three) times daily as needed for anxiety.  270 tablet  1  . cyclobenzaprine (FLEXERIL) 10  MG tablet TAKE 1 TABLET 3 TIMES A DAY AS NEEDED FOR SPASMS  60 tablet  5  . diclofenac sodium (VOLTAREN) 1 % GEL Apply 1 application topically 4 (four) times daily.  5 Tube  10  . ferrous sulfate 325 (65 FE) MG tablet Take 325 mg by mouth daily with breakfast.       . fish oil-omega-3 fatty acids 1000 MG capsule Take 2 g by mouth daily.        . fluticasone (FLONASE) 50 MCG/ACT nasal spray Place 2 sprays into the nose daily.      . halobetasol (ULTRAVATE) 0.05 % cream Apply topically 2 (two) times daily.  50 g  5  . HYDROcodone-acetaminophen (VICODIN) 5-500 MG per tablet Take 1 tablet by mouth every 6 (six) hours as needed.  120 tablet  5  . levothyroxine (SYNTHROID, LEVOTHROID) 150 MCG tablet TAKE 1 TABLET BY MOUTH EVERY DAY  30 tablet  6  . nitroGLYCERIN (NITROSTAT) 0.4 MG SL tablet Place 1 tablet (0.4 mg total) under the tongue every 5 (five) minutes as needed for chest pain. For chest pain  25 tablet  6  . omeprazole (PRILOSEC) 20 MG capsule Take 1 capsule (20 mg total) by mouth 2 (two) times daily.  180 capsule  3  .  oxyCODONE-acetaminophen (PERCOCET) 10-650 MG per tablet Take 1 tablet by mouth every 6 (six) hours as needed for pain. As needed  120 tablet  0  . rOPINIRole (REQUIP) 2 MG tablet TAKE 2 TABLETS BY MOUTH AT BEDTIME  60 tablet  11    BP 160/110  Pulse 86  Temp 98 F (36.7 C) (Oral)  Wt 221 lb (100.245 kg)  SpO2 96%       Objective:   Physical Exam  Constitutional: He appears well-developed and well-nourished.  HENT:  Head: Normocephalic and atraumatic.  Right Ear: External ear normal.  Mouth/Throat: No oropharyngeal exudate.       Left tympanic membrane erythematous and retracted Oropharyngeal erythema  Eyes: EOM are normal. Pupils are equal, round, and reactive to light.  Neck: Neck supple.       No neck tenderness  Cardiovascular: Normal rate and regular rhythm.   Pulmonary/Chest: Effort normal and breath sounds normal. He has no wheezes. He has no rales.  Skin: Skin is dry.  Psychiatric: He has a normal mood and affect. His behavior is normal.          Assessment & Plan:

## 2012-07-21 NOTE — Patient Instructions (Addendum)
Use nasal saline spray over the counter as directed Use tylenol 650 mg every 8 hrs as needed Please call our office if your symptoms do not improve or gets worse.

## 2012-07-21 NOTE — Assessment & Plan Note (Signed)
72 year old white male with possible sinusitis.  Treat with cefpodxime 200 mg bid x 10 days.  Use intranasal saline as directed.  Patient advised to call office if symptoms persist or worsen.

## 2012-07-22 ENCOUNTER — Ambulatory Visit: Payer: Medicare Other | Admitting: Internal Medicine

## 2012-08-16 ENCOUNTER — Other Ambulatory Visit: Payer: Self-pay | Admitting: Family Medicine

## 2012-08-19 ENCOUNTER — Emergency Department (HOSPITAL_COMMUNITY)
Admission: EM | Admit: 2012-08-19 | Discharge: 2012-08-19 | Disposition: A | Discharge status: Home / Self Care | Payer: Medicare Other | Attending: Emergency Medicine | Admitting: Emergency Medicine

## 2012-08-19 ENCOUNTER — Emergency Department (HOSPITAL_COMMUNITY): Payer: Medicare Other

## 2012-08-19 ENCOUNTER — Encounter (HOSPITAL_COMMUNITY): Payer: Self-pay

## 2012-08-19 DIAGNOSIS — Z8719 Personal history of other diseases of the digestive system: Secondary | ICD-10-CM | POA: Insufficient documentation

## 2012-08-19 DIAGNOSIS — Z8669 Personal history of other diseases of the nervous system and sense organs: Secondary | ICD-10-CM | POA: Insufficient documentation

## 2012-08-19 DIAGNOSIS — K219 Gastro-esophageal reflux disease without esophagitis: Secondary | ICD-10-CM | POA: Insufficient documentation

## 2012-08-19 DIAGNOSIS — IMO0002 Reserved for concepts with insufficient information to code with codable children: Secondary | ICD-10-CM | POA: Insufficient documentation

## 2012-08-19 DIAGNOSIS — Z9889 Other specified postprocedural states: Secondary | ICD-10-CM | POA: Insufficient documentation

## 2012-08-19 DIAGNOSIS — G2581 Restless legs syndrome: Secondary | ICD-10-CM | POA: Insufficient documentation

## 2012-08-19 DIAGNOSIS — Z8739 Personal history of other diseases of the musculoskeletal system and connective tissue: Secondary | ICD-10-CM | POA: Insufficient documentation

## 2012-08-19 DIAGNOSIS — D649 Anemia, unspecified: Secondary | ICD-10-CM | POA: Insufficient documentation

## 2012-08-19 DIAGNOSIS — Z862 Personal history of diseases of the blood and blood-forming organs and certain disorders involving the immune mechanism: Secondary | ICD-10-CM | POA: Insufficient documentation

## 2012-08-19 DIAGNOSIS — Z860100 Personal history of colon polyps, unspecified: Secondary | ICD-10-CM | POA: Insufficient documentation

## 2012-08-19 DIAGNOSIS — Z7982 Long term (current) use of aspirin: Secondary | ICD-10-CM | POA: Insufficient documentation

## 2012-08-19 DIAGNOSIS — I252 Old myocardial infarction: Secondary | ICD-10-CM | POA: Insufficient documentation

## 2012-08-19 DIAGNOSIS — I10 Essential (primary) hypertension: Secondary | ICD-10-CM | POA: Insufficient documentation

## 2012-08-19 DIAGNOSIS — L98499 Non-pressure chronic ulcer of skin of other sites with unspecified severity: Secondary | ICD-10-CM | POA: Insufficient documentation

## 2012-08-19 DIAGNOSIS — I2510 Atherosclerotic heart disease of native coronary artery without angina pectoris: Secondary | ICD-10-CM | POA: Insufficient documentation

## 2012-08-19 DIAGNOSIS — E039 Hypothyroidism, unspecified: Secondary | ICD-10-CM | POA: Insufficient documentation

## 2012-08-19 DIAGNOSIS — R079 Chest pain, unspecified: Secondary | ICD-10-CM

## 2012-08-19 DIAGNOSIS — Z79899 Other long term (current) drug therapy: Secondary | ICD-10-CM | POA: Insufficient documentation

## 2012-08-19 DIAGNOSIS — Z87891 Personal history of nicotine dependence: Secondary | ICD-10-CM | POA: Insufficient documentation

## 2012-08-19 DIAGNOSIS — Z8709 Personal history of other diseases of the respiratory system: Secondary | ICD-10-CM | POA: Insufficient documentation

## 2012-08-19 DIAGNOSIS — Z8639 Personal history of other endocrine, nutritional and metabolic disease: Secondary | ICD-10-CM | POA: Insufficient documentation

## 2012-08-19 LAB — CBC WITH DIFFERENTIAL/PLATELET
Basophils Absolute: 0.1 10*3/uL (ref 0.0–0.1)
Basophils Relative: 1 % (ref 0–1)
Eosinophils Absolute: 0.7 10*3/uL (ref 0.0–0.7)
HCT: 49.3 % (ref 39.0–52.0)
MCH: 31.6 pg (ref 26.0–34.0)
MCHC: 33.7 g/dL (ref 30.0–36.0)
Monocytes Absolute: 1 10*3/uL (ref 0.1–1.0)
Neutro Abs: 5.1 10*3/uL (ref 1.7–7.7)
Neutrophils Relative %: 47 % (ref 43–77)
RDW: 13.9 % (ref 11.5–15.5)

## 2012-08-19 LAB — TROPONIN I: Troponin I: <0.3 ng/mL (ref <0.30)

## 2012-08-19 LAB — COMPREHENSIVE METABOLIC PANEL
AST: 22 U/L (ref 0–37)
Albumin: 4 g/dL (ref 3.5–5.2)
Chloride: 102 mEq/L (ref 96–112)
Creatinine, Ser: 0.88 mg/dL (ref 0.50–1.35)
Total Bilirubin: 0.4 mg/dL (ref 0.3–1.2)
Total Protein: 7.4 g/dL (ref 6.0–8.3)

## 2012-08-19 LAB — APTT: aPTT: 37 seconds (ref 24–37)

## 2012-08-19 LAB — PROTIME-INR: INR: 1.05 (ref 0.00–1.49)

## 2012-08-19 MED ORDER — LORAZEPAM 2 MG/ML IJ SOLN
1.0000 mg | Freq: Once | INTRAMUSCULAR | Status: AC
  Administered 2012-08-19: 1 mg via INTRAVENOUS
  Filled 2012-08-19: qty 1

## 2012-08-19 NOTE — Telephone Encounter (Signed)
Call in Clonazepam #270 with one rf. Also change the Vicodin to 5/325 #120 with 5 rf

## 2012-08-19 NOTE — ED Notes (Signed)
Pt discharged.Vital signs stable and GCS 15 

## 2012-08-19 NOTE — Consult Note (Signed)
Referring Physician: Dr. Devoria Albe Primary Cardiologist: Clifton James Reason for Consultation: Chest pain   HPI:  72 yo WM with history of CAD, HTN, HLD, OSA, COPD and bradycardia.   He has been followed in the past by Dr. Juanda Chance and now by Dr. Clifton James (last seen 8/13). Catheterization in 2009 showed nonobstructive CAD (50% LAD stenosis, 40% RCA stenosis). October 2011 he was admitted from the GI office was in acute GI bleeding. He required transfusions. He was seen by Dr. Abran Cantor on 05/23/11 and was started on lasix for lower extremity edema and SOB. His breathing was much better on lasix. His lower extremity edema resolved. Echocardiogram November 2012 showed normal LV function and normal wall motion. He does not tolerate statins.   He has had chronic intermittent CP particularly when he lifts heavy objects or gets upset.No CP with daily activities or at rest.  This pattern is completely unchanged. Today got in an argument with his nephew and had recurrent CP radiating to jaw and left arm. Lasted 4 hours and finally came to ER. Pain relieved with ativan.  ECG with NSR and Non-specific ST-T wave abnormalities. (unchanged). Troponin is normal.     Review of Systems:     Cardiac Review of Systems: {Y] = yes [ ]  = no  Chest Pain [  y  ]  Resting SOB [   ] Exertional SOB  Cove.Etienne  ]  Orthopnea [  ]   Pedal Edema [ y  ]    Palpitations [  ] Syncope  [  ]   Presyncope [   ]  General Review of Systems: [Y] = yes [  ]=no Constitional: recent weight change [  ]; anorexia [  ]; fatigue [  ]; nausea [  ]; night sweats [  ]; fever [  ]; or chills [  ];                                                                                                                                           Eye : blurred vision [  ]; diplopia [   ]; vision changes [  ];  Amaurosis fugax[  ]; Resp: cough [  ];  wheezing[  ];  hemoptysis[  ]; shortness of breath[  ]; paroxysmal nocturnal dyspnea[  ]; dyspnea on exertion[  ]; or  orthopnea[  ];  GI:  gallstones[  ], vomiting[  ];  dysphagia[  ]; melena[  ];  hematochezia [  ]; heartburn[  ];  GU: kidney stones [  ]; hematuria[  ];   dysuria [  ];  nocturia[  ];  history of     obstruction [  ];                 Skin: rash, swelling[  ];, hair loss[  ];  peripheral edema[  ];  or itching[  ];  Musculosketetal: myalgias[  ];  joint swelling[  ];  joint erythema[  ];  joint pain[y  ];  back pain[  ];  Heme/Lymph: bruising[  ];  bleeding[  ];  anemia[  ];  Neuro: TIA[  ];  headaches[  ];  stroke[  ];  vertigo[  ];  seizures[  ];   paresthesias[  ];  difficulty walking[  ];  Psych:depression[  ]; Kendell Bane  ];  Endocrine: diabetes[  ];  thyroid dysfunction[  ];  Other:  Past Medical History  Diagnosis Date  . Anemia   . Hyperlipidemia   . Hypertension   . Allergy   . Myocardial infarction   . Ulcer   . GERD (gastroesophageal reflux disease)   . Foot pain     left  . Restless leg syndrome   . Hypothyroid   . Headache   . Obstructive sleep apnea   . Fatigue   . CAD (coronary artery disease)   . Gastritis   . Bronchitis   . Back pain     low  . Diverticulosis   . Colon polyps      (Not in a hospital admission)       Infusions:     Allergies  Allergen Reactions  . Lipitor (Atorvastatin Calcium)     myalgias  . Metoprolol Tartrate   . Shellfish Allergy   . Simvastatin     myalgias  . Trazodone And Nefazodone     Unsteady on feet  . Zolpidem     Chest pain  . Amoxicillin-Pot Clavulanate Rash    History   Social History  . Marital Status: Married    Spouse Name: N/A    Number of Children: N/A  . Years of Education: N/A   Occupational History  . Disabled    Social History Main Topics  . Smoking status: Former Smoker -- 2.0 packs/day for 50 years    Types: Cigarettes    Quit date: 07/24/2007  . Smokeless tobacco: Never Used  . Alcohol Use: No  . Drug Use: No  . Sexually Active: Not on file   Other Topics Concern  . Not on  file   Social History Narrative  . No narrative on file    Family History  Problem Relation Age of Onset  . Heart disease Father     PHYSICAL EXAM: Filed Vitals:   08/19/12 1939  BP: 132/90  Pulse: 74  Temp: 97.6 F (36.4 C)  Resp: 20    No intake or output data in the 24 hours ending 08/19/12 1944  General:  Ambulates room No respiratory difficulty HEENT: normal Neck: supple.thick.  No obvious  JVD. Carotids 2+ bilat; no bruits. No lymphadenopathy or thryomegaly appreciated. Cor: PMI nonpalpable. Regular rate & rhythm. No rubs, gallops or murmurs. Lungs: clear with decreased BS throughout Abdomen: prominent central obesity soft, nontender, nondistended. No bruits or masses. Good bowel sounds. Extremities: no cyanosis, clubbing, rash, edema Neuro: alert & oriented x 3, cranial nerves grossly intact. moves all 4 extremities w/o difficulty. Affect pleasant.  ECG: SR. Non-specific ST-T wave abnormalities. No change from previous   Results for orders placed during the hospital encounter of 08/19/12 (from the past 24 hour(s))  CBC WITH DIFFERENTIAL     Status: Abnormal   Collection Time   08/19/12  5:23 PM      Component Value Range   WBC 10.8 (*) 4.0 - 10.5 K/uL   RBC 5.26  4.22 - 5.81 MIL/uL  Hemoglobin 16.6  13.0 - 17.0 g/dL   HCT 16.1  09.6 - 04.5 %   MCV 93.7  78.0 - 100.0 fL   MCH 31.6  26.0 - 34.0 pg   MCHC 33.7  30.0 - 36.0 g/dL   RDW 40.9  81.1 - 91.4 %   Platelets 271  150 - 400 K/uL   Neutrophils Relative 47  43 - 77 %   Neutro Abs 5.1  1.7 - 7.7 K/uL   Lymphocytes Relative 36  12 - 46 %   Lymphs Abs 3.9  0.7 - 4.0 K/uL   Monocytes Relative 9  3 - 12 %   Monocytes Absolute 1.0  0.1 - 1.0 K/uL   Eosinophils Relative 7 (*) 0 - 5 %   Eosinophils Absolute 0.7  0.0 - 0.7 K/uL   Basophils Relative 1  0 - 1 %   Basophils Absolute 0.1  0.0 - 0.1 K/uL  COMPREHENSIVE METABOLIC PANEL     Status: Abnormal   Collection Time   08/19/12  5:23 PM      Component  Value Range   Sodium 140  135 - 145 mEq/L   Potassium 4.0  3.5 - 5.1 mEq/L   Chloride 102  96 - 112 mEq/L   CO2 26  19 - 32 mEq/L   Glucose, Bld 77  70 - 99 mg/dL   BUN 18  6 - 23 mg/dL   Creatinine, Ser 7.82  0.50 - 1.35 mg/dL   Calcium 9.5  8.4 - 95.6 mg/dL   Total Protein 7.4  6.0 - 8.3 g/dL   Albumin 4.0  3.5 - 5.2 g/dL   AST 22  0 - 37 U/L   ALT 25  0 - 53 U/L   Alkaline Phosphatase 70  39 - 117 U/L   Total Bilirubin 0.4  0.3 - 1.2 mg/dL   GFR calc non Af Amer 85 (*) >90 mL/min   GFR calc Af Amer >90  >90 mL/min  TROPONIN I     Status: Normal   Collection Time   08/19/12  5:23 PM      Component Value Range   Troponin I <0.30  <0.30 ng/mL  APTT     Status: Normal   Collection Time   08/19/12  5:23 PM      Component Value Range   aPTT 37  24 - 37 seconds  PROTIME-INR     Status: Normal   Collection Time   08/19/12  5:23 PM      Component Value Range   Prothrombin Time 13.6  11.6 - 15.2 seconds   INR 1.05  0.00 - 1.49   Dg Chest Portable 1 View  08/19/2012  *RADIOLOGY REPORT*  Clinical Data: Mid to left chest pain  PORTABLE CHEST - 1 VIEW  Comparison: 08/29/2009  Findings: Lung volumes are low with crowding of the bronchovascular markings.  Bilateral lower lobe curvilinear presumed atelectasis. Patchy retrocardiac opacity is present.  No pleural effusion.  No acute osseous finding.  Right AC joint degenerative change.  IMPRESSION: Low lung volumes with presumed bibasilar atelectasis and patchy retrocardiac opacity which could represent atelectasis as well, although early pneumonia or other alveolar filling processes could have a similar appearance. If the patient's symptoms continue, consider PA and lateral chest radiographs obtained at full inspiration when the patient is clinically able.   Original Report Authenticated By: Christiana Pellant, M.D.      ASSESSMENT:  1. Substernal CP 2. Nonobstructive CAD by  cath 2009 3. Anxiety 4. COPD 6. Obesity  PLAN/DISCUSSION:  His CP  is concerning for angina however his symptom pattern has been completely stable for several years and his ECG and troponins are normal despite prolonged symptoms. Thus, I think we can safely discharge him home from the ER with close outpatient f/u. Given his RFs and CP quality, I think it it reasonable to consider outpatient cardiac catheterization (vs Myoview) to further risk stratify. I have left a message with our office and hopefully he can be seen soon by Dr. Clifton James or one of our office based midlevels to arrange. If his pain recurs I told him to call 911 and return to the ER. Unfortunately he has been intolerant of statins, NTG and b-blockers.  Truman Hayward 7:55 PM

## 2012-08-19 NOTE — ED Notes (Signed)
EKG delayed due to malfunctioning leads and pt moving. Old and new EKG given to Dr. Lynelle Doctor, copies placed in chart.

## 2012-08-19 NOTE — ED Provider Notes (Signed)
History     CSN: [id number]  Arrival date & time 08/19/12  1551   First MD Initiated Contact with Patient 08/19/12 1604      Chief Complaint  Patient presents with  . Chest Pain    (Consider location/radiation/quality/duration/timing/severity/associated sxs/prior treatment) HPI  Patient reports about 10:30 this morning after arguing with her family member he started getting a chest pain that is in the center and slightly to left in his chest that lasted until about 2:45 PM when he went to the fire station to be evaluated. He states after they put him on oxygen and gave him aspirin his pain resolved. He describes the pain as a "solid" or pressure pain and his left arm started to feel a little bit numb and he felt like it was starting to go into his jaw. He denies nausea, vomiting, diaphoresis, or shortness of breath. He states he gets chest pain every so often which normally goes away with an aspirin. He relates the pain to stress such as arguments with family or picking up heavy objects. Patient is followed by [facility] cardiology and he had a cardiac cath done in 2009. I looked at the report it had been on obstructive coronary artery disease with a 50% lesion in his LAD and a 40% lesion in his RCA. His last cardiac echo was in 2012 which showed a normal LV function and normal wall motion. Patient states he had an MI about 4 years ago. He states at that time they did not place any stents.  Patient refuses to take nitroglycerin for his chest pain because of severe headaches  PCP Dr Clent Ridges Cardiologist Dr Sanjuana Kava  Past Medical History  Diagnosis Date  . Anemia   . Hyperlipidemia   . Hypertension   . Allergy   . Myocardial infarction   . Ulcer   . GERD (gastroesophageal reflux disease)   . Foot pain     left  . Restless leg syndrome   . Hypothyroid   . Headache   . Obstructive sleep apnea   . Fatigue   . CAD (coronary artery disease)   . Gastritis   . Bronchitis   . Back pain     low  . Diverticulosis   . Colon polyps     Past Surgical History  Procedure Date  . Appendectomy   . Hernia repair   . Tonsillectomy   . Ankle surgery     left   . Colonoscopy 08-23-05    per Dr. Arlyce Dice, adenomatous polyps, repeat in 5 yrs   . Esophagogastroduodenoscopy 08-23-05    per Dr. Arlyce Dice, cauterized jejunal AVMs     Family History  Problem Relation Age of Onset  . Heart disease Father     History  Substance Use Topics  . Smoking status: Former Smoker -- 2.0 packs/day for 50 years    Types: Cigarettes    Quit date: 07/24/2007  . Smokeless tobacco: Never Used  . Alcohol Use: No   Lives at home Lives with spouse   Review of Systems  All other systems reviewed and are negative.    Allergies  Lipitor; Metoprolol tartrate; Shellfish allergy; Simvastatin; Trazodone and nefazodone; Zolpidem; and Amoxicillin-pot clavulanate  Home Medications   Current Outpatient Rx  Name  Route  Sig  Dispense  Refill  . AMLODIPINE BESYLATE 5 MG PO TABS   Oral   Take 5 mg by mouth daily.         [name] VITAMIN C 1000  MG PO TABS   Oral   Take 1,000 mg by mouth daily.           . ASPIRIN 81 MG PO TABS   Oral   Take 81 mg by mouth daily.           [name] CALCIUM MAGNESIUM PO   Oral   Take 1 tablet by mouth daily.           [name] CASANTHRANOL-DOCUSATE SODIUM 30-100 MG PO CAPS   Oral   Take 1 capsule by mouth as needed. For constipation         . CLONAZEPAM 0.5 MG PO TABS   Oral   Take 1 tablet (0.5 mg total) by mouth 3 (three) times daily as needed for anxiety.   270 tablet   1   . CYCLOBENZAPRINE HCL 10 MG PO TABS   Oral   Take 10 mg by mouth 3 (three) times daily as needed. For muscle spasms         . DICLOFENAC SODIUM 1 % TD GEL   Topical   Apply 1 application topically 4 (four) times daily.   5 Tube   10   . FERROUS SULFATE 325 (65 FE) MG PO TABS   Oral   Take 325 mg by mouth daily with breakfast.          . OMEGA-3 FATTY ACIDS 1000 MG PO CAPS    Oral   Take 2 g by mouth daily.           [name] FLUTICASONE PROPIONATE 50 MCG/ACT NA SUSP   Nasal   Place 2 sprays into the nose daily.         [name] HYDROCODONE-ACETAMINOPHEN 5-500 MG PO TABS   Oral   Take 1 tablet by mouth every 6 (six) hours as needed.   120 tablet   5   . LEVOTHYROXINE SODIUM 150 MCG PO TABS   Oral   Take 150 mcg by mouth daily.         [name] NITROGLYCERIN 0.4 MG SL SUBL   Sublingual   Place 1 tablet (0.4 mg total) under the tongue every 5 (five) minutes as needed for chest pain. For chest pain   25 tablet   6   . OMEPRAZOLE 20 MG PO CPDR   Oral   Take 1 capsule (20 mg total) by mouth 2 (two) times daily.   180 capsule   3   . OXYCODONE-ACETAMINOPHEN 10-650 MG PO TABS   Oral   Take 1 tablet by mouth every 6 (six) hours as needed for pain. As needed   120 tablet   0   . ROPINIROLE HCL 2 MG PO TABS   Oral   Take 4 mg by mouth at bedtime.           BP 126/74  Pulse 71  Temp 98.4 F (36.9 C) (Oral)  Resp 18  SpO2 96%  Laboratory interpretation all normal except    Physical Exam  Nursing note and vitals reviewed. Constitutional: He is oriented to person, place, and time. He appears well-developed and well-nourished.  Non-toxic appearance. He does not appear ill. No distress.  HENT:  Head: Normocephalic and atraumatic.  Right Ear: External ear normal.  Left Ear: External ear normal.  Nose: Nose normal. No mucosal edema or rhinorrhea.  Mouth/Throat: Oropharynx is clear and moist and mucous membranes are normal. No dental abscesses or uvula swelling.  Eyes: Conjunctivae normal and EOM are normal. Pupils  are equal, round, and reactive to light.  Neck: Normal range of motion and full passive range of motion without pain. Neck supple.  Cardiovascular: Normal rate, regular rhythm and normal heart sounds.  Exam reveals no gallop and no friction rub.   No murmur heard. Pulmonary/Chest: Effort normal and breath sounds normal. No respiratory distress.  He has no wheezes. He has no rhonchi. He has no rales. He exhibits no tenderness and no crepitus.         Area of pain noted, nontender  Abdominal: Soft. Normal appearance and bowel sounds are normal. He exhibits no distension. There is no tenderness. There is no rebound and no guarding.  Musculoskeletal: Normal range of motion. He exhibits no edema and no tenderness.       Moves all extremities well.   Neurological: He is alert and oriented to person, place, and time. He has normal strength. No cranial nerve deficit.  Skin: Skin is warm, dry and intact. No rash noted. No erythema. No pallor.  Psychiatric: He has a normal mood and affect. His speech is normal and behavior is normal. His mood appears not anxious.    ED Course  Procedures (including critical care time)  Medications  levothyroxine (SYNTHROID, LEVOTHROID) 150 MCG tablet (not administered)  rOPINIRole (REQUIP) 2 MG tablet (not administered)  LORazepam (ATIVAN) injection 1 mg (1 mg Intravenous Given 08/19/12 1738)   During my interview patient had one episode of chest pain lasting about 10 seconds.   Pt better after ativan  19:25 Dr Jones Broom here in ED and talked to patient. Feels he can go home, he will leave a message at the office for him to be seen soon by Dr Sanjuana Kava in the office to discuss doing another stress test or cardiac cath. Dr Jones Broom did stress to return if his pain returned or got worse.   Results for orders placed during the hospital encounter of 08/19/12  CBC WITH DIFFERENTIAL      Component Value Range   WBC 10.8 (*) 4.0 - 10.5 K/uL   RBC 5.26  4.22 - 5.81 MIL/uL   Hemoglobin 16.6  13.0 - 17.0 g/dL   HCT 16.1  09.6 - 04.5 %   MCV 93.7  78.0 - 100.0 fL   MCH 31.6  26.0 - 34.0 pg   MCHC 33.7  30.0 - 36.0 g/dL   RDW 40.9  81.1 - 91.4 %   Platelets 271  150 - 400 K/uL   Neutrophils Relative 47  43 - 77 %   Neutro Abs 5.1  1.7 - 7.7 K/uL   Lymphocytes Relative 36  12 - 46 %   Lymphs Abs 3.9  0.7 -  4.0 K/uL   Monocytes Relative 9  3 - 12 %   Monocytes Absolute 1.0  0.1 - 1.0 K/uL   Eosinophils Relative 7 (*) 0 - 5 %   Eosinophils Absolute 0.7  0.0 - 0.7 K/uL   Basophils Relative 1  0 - 1 %   Basophils Absolute 0.1  0.0 - 0.1 K/uL  COMPREHENSIVE METABOLIC PANEL      Component Value Range   Sodium 140  135 - 145 mEq/L   Potassium 4.0  3.5 - 5.1 mEq/L   Chloride 102  96 - 112 mEq/L   CO2 26  19 - 32 mEq/L   Glucose, Bld 77  70 - 99 mg/dL   BUN 18  6 - 23 mg/dL   Creatinine, Ser 7.82  0.50 - 1.35  mg/dL   Calcium 9.5  8.4 - 16.1 mg/dL   Total Protein 7.4  6.0 - 8.3 g/dL   Albumin 4.0  3.5 - 5.2 g/dL   AST 22  0 - 37 U/L   ALT 25  0 - 53 U/L   Alkaline Phosphatase 70  39 - 117 U/L   Total Bilirubin 0.4  0.3 - 1.2 mg/dL   GFR calc non Af Amer 85 (*) >90 mL/min   GFR calc Af Amer >90  >90 mL/min  TROPONIN I      Component Value Range   Troponin I <0.30  <0.30 ng/mL  APTT      Component Value Range   aPTT 37  24 - 37 seconds  PROTIME-INR      Component Value Range   Prothrombin Time 13.6  11.6 - 15.2 seconds   INR 1.05  0.00 - 1.49   Laboratory interpretation all normal  Dg Chest Portable 1 View  08/19/2012  *RADIOLOGY REPORT*  Clinical Data: Mid to left chest pain  PORTABLE CHEST - 1 VIEW  Comparison: 08/29/2009  Findings: Lung volumes are low with crowding of the bronchovascular markings.  Bilateral lower lobe curvilinear presumed atelectasis. Patchy retrocardiac opacity is present.  No pleural effusion.  No acute osseous finding.  Right AC joint degenerative change.  IMPRESSION: Low lung volumes with presumed bibasilar atelectasis and patchy retrocardiac opacity which could represent atelectasis as well, although early pneumonia or other alveolar filling processes could have a similar appearance. If the patient's symptoms continue, consider PA and lateral chest radiographs obtained at full inspiration when the patient is clinically able.   Original Report Authenticated By:  Christiana Pellant, M.D.      Date: 08/19/2012  Rate: 65  Rhythm: normal sinus rhythm  QRS Axis: normal  Intervals: normal  ST/T Wave abnormalities: nonspecific T wave changes  Conduction Disutrbances:none  Narrative Interpretation:   Old EKG Reviewed: unchanged from 04/28/2010     1. Chest pain     Plan discharge  Devoria Albe, MD, FACEP   MDM           Ward Givens, MD 08/19/12 [phone]

## 2012-08-19 NOTE — ED Notes (Signed)
EMS reports patient with intermittant chest pain since 1030 am, rads to left arm and neck, had ASA 324, refused NTG or Morphine by EMS, no pain on arrival to the ED, sig hx of same

## 2012-08-20 MED ORDER — CLONAZEPAM 0.5 MG PO TABS: 0.5000 mg | ORAL_TABLET | Freq: Three times a day (TID) | ORAL | Status: DC | PRN

## 2012-08-20 MED ORDER — HYDROCODONE-ACETAMINOPHEN 5-325 MG PO TABS: 1.0000 | ORAL_TABLET | Freq: Four times a day (QID) | ORAL | Status: DC | PRN

## 2012-08-20 NOTE — Addendum Note (Signed)
Addended by: Aniceto Boss A on: 08/20/2012 12:06 PM   Modules accepted: Orders

## 2012-08-25 ENCOUNTER — Telehealth: Payer: Self-pay | Admitting: Cardiovascular Disease

## 2012-08-25 NOTE — Telephone Encounter (Signed)
New problem    Was told by emergency room to set up a stress test & set up heart cath.

## 2012-08-25 NOTE — Telephone Encounter (Signed)
Spoke with pt, according to the consult note from dr bensimhon, the pt will need to see dr Clifton James to decide what testing would be best. Follow up scheduled

## 2012-08-27 ENCOUNTER — Ambulatory Visit (INDEPENDENT_AMBULATORY_CARE_PROVIDER_SITE_OTHER): Payer: Medicare Other | Admitting: Cardiovascular Disease

## 2012-08-27 ENCOUNTER — Encounter: Payer: Self-pay | Admitting: Cardiovascular Disease

## 2012-08-27 ENCOUNTER — Encounter: Payer: Self-pay | Admitting: *Deleted

## 2012-08-27 VITALS — BP 149/81 | HR 75 | Ht 70.0 in | Wt 226.8 lb

## 2012-08-27 DIAGNOSIS — I200 Unstable angina: Secondary | ICD-10-CM

## 2012-08-27 DIAGNOSIS — I2510 Atherosclerotic heart disease of native coronary artery without angina pectoris: Secondary | ICD-10-CM

## 2012-08-27 LAB — CBC WITH DIFFERENTIAL/PLATELET
Basophils Relative: 0.5 % (ref 0.0–3.0)
Eosinophils Relative: 5.3 % — ABNORMAL HIGH (ref 0.0–5.0)
HCT: 49.3 % (ref 39.0–52.0)
Hemoglobin: 16.5 g/dL (ref 13.0–17.0)
Lymphocytes Relative: 26.9 % (ref 12.0–46.0)
Lymphs Abs: 3.1 10*3/uL (ref 0.7–4.0)
Monocytes Relative: 10.3 % (ref 3.0–12.0)
Neutro Abs: 6.7 10*3/uL (ref 1.4–7.7)
RBC: 5.36 Mil/uL (ref 4.22–5.81)
RDW: 13.8 % (ref 11.5–14.6)
WBC: 11.7 10*3/uL — ABNORMAL HIGH (ref 4.5–10.5)

## 2012-08-27 LAB — BASIC METABOLIC PANEL
GFR: 83.1 mL/min (ref >60.00)
Glucose, Bld: 109 mg/dL — ABNORMAL HIGH (ref 70–99)
Potassium: 3.6 mEq/L (ref 3.5–5.1)
Sodium: 139 mEq/L (ref 135–145)

## 2012-08-27 NOTE — Patient Instructions (Addendum)
Your physician recommends that you schedule a follow-up appointment in:  5 weeks with Dr. Clifton James  Your physician has requested that you have a cardiac catheterization. Cardiac catheterization is used to diagnose and/or treat various heart conditions. Doctors may recommend this procedure for a number of different reasons. The most common reason is to evaluate chest pain. Chest pain can be a symptom of coronary artery disease (CAD), and cardiac catheterization can show whether plaque is narrowing or blocking your heart's arteries. This procedure is also used to evaluate the valves, as well as measure the blood flow and oxygen levels in different parts of your heart. For further information please visit [email or web address] Please follow instruction sheet, as given.

## 2012-08-27 NOTE — Progress Notes (Signed)
 History of Present Illness: 71 yo WM with history of CAD, HTN, HLD, OSA, bradycardia here today for cardiac follow up. He has been followed in the past by Dr. Brodie. Catheterization in 2009 showed nonobstructive CAD (50% LAD stenosis, 40% RCA stenosis). October 2011 he was admitted from the GI office was in acute GI bleeding. He required transfusions. He was seen by Dr. Fry on 05/23/11 and was started on lasix for lower extremity edema and SOB. His breathing was much better on lasix. His lower extremity edema resolved. Echocardiogram November 2012 showed normal LV function and normal wall motion. He does not tolerate statins. He was seen in the ED on 08/19/12 with chest pain by Dr. Bensimhon and sent home with plans for outpatient f/u. Troponin was normal and EKG was unchanged. He had been arguing with a friend that day. He began to have pain that lasted for 4 hours. The pain was severe. He is intolerant of statins.   He is here today for follow up. He gets easily SOB with strenuous activities. He has 100 pack years of tobacco abuse but stopped smoking 2009. He tells me that he has been having chest pains. These are exertional. Also fatigue and total lack of energy. He is out of breath walking to the mailbox.   Primary Care Physician: Stephen Fry  Last Lipid Profile:Lipid Panel     Component Value Date/Time   CHOL 176 07/31/2011 1040   TRIG 148.0 07/31/2011 1040   HDL 39.90 07/31/2011 1040   CHOLHDL 4 07/31/2011 1040   VLDL 29.6 07/31/2011 1040   LDLCALC 107* 07/31/2011 1040     Past Medical History  Diagnosis Date  . Anemia   . Hyperlipidemia   . Hypertension   . Allergy   . Myocardial infarction   . Ulcer   . GERD (gastroesophageal reflux disease)   . Foot pain     left  . Restless leg syndrome   . Hypothyroid   . Headache   . Obstructive sleep apnea   . Fatigue   . CAD (coronary artery disease)   . Gastritis   . Bronchitis   . Back pain     low  . Diverticulosis   . Colon polyps       Past Surgical History  Procedure Date  . Appendectomy   . Hernia repair   . Tonsillectomy   . Ankle surgery     left   . Colonoscopy 08-23-05    per Dr. Kaplan, adenomatous polyps, repeat in 5 yrs   . Esophagogastroduodenoscopy 08-23-05    per Dr. Kaplan, cauterized jejunal AVMs     Current Outpatient Prescriptions  Medication Sig Dispense Refill  . amLODipine (NORVASC) 5 MG tablet Take 5 mg by mouth daily.      . Ascorbic Acid (VITAMIN C) 1000 MG tablet Take 1,000 mg by mouth daily.        . aspirin 81 MG tablet Take 81 mg by mouth daily.        . Calcium-Magnesium-Vitamin D (CALCIUM MAGNESIUM PO) Take 1 tablet by mouth daily.        . Casanthranol-Docusate Sodium 30-100 MG CAPS Take 1 capsule by mouth as needed. For constipation      . clonazePAM (KLONOPIN) 0.5 MG tablet Take 1 tablet (0.5 mg total) by mouth 3 (three) times daily as needed for anxiety.  270 tablet  1  . cyclobenzaprine (FLEXERIL) 10 MG tablet Take 10 mg by mouth 3 (three) times daily   as needed. For muscle spasms      . diclofenac sodium (VOLTAREN) 1 % GEL Apply 1 application topically 4 (four) times daily.  5 Tube  10  . ferrous sulfate 325 (65 FE) MG tablet Take 325 mg by mouth daily with breakfast.       . fish oil-omega-3 fatty acids 1000 MG capsule Take 2 g by mouth daily.        . fluticasone (FLONASE) 50 MCG/ACT nasal spray Place 2 sprays into the nose daily.      . HYDROcodone-acetaminophen (NORCO/VICODIN) 5-325 MG per tablet Take 1 tablet by mouth every 6 (six) hours as needed for pain.  120 tablet  5  . levothyroxine (SYNTHROID, LEVOTHROID) 150 MCG tablet Take 150 mcg by mouth daily.      . nitroGLYCERIN (NITROSTAT) 0.4 MG SL tablet Place 1 tablet (0.4 mg total) under the tongue every 5 (five) minutes as needed for chest pain. For chest pain  25 tablet  6  . omeprazole (PRILOSEC) 20 MG capsule Take 1 capsule (20 mg total) by mouth 2 (two) times daily.  180 capsule  3  . oxyCODONE-acetaminophen (PERCOCET)  10-650 MG per tablet Take 1 tablet by mouth every 6 (six) hours as needed for pain. As needed  120 tablet  0  . rOPINIRole (REQUIP) 2 MG tablet Take 4 mg by mouth at bedtime.        Allergies  Allergen Reactions  . Lipitor (Atorvastatin Calcium)     myalgias  . Metoprolol Tartrate   . Shellfish Allergy   . Simvastatin     myalgias  . Trazodone And Nefazodone     Unsteady on feet  . Zolpidem     Chest pain  . Amoxicillin-Pot Clavulanate Rash    History   Social History  . Marital Status: Married    Spouse Name: N/A    Number of Children: N/A  . Years of Education: N/A   Occupational History  . Disabled    Social History Main Topics  . Smoking status: Former Smoker -- 2.0 packs/day for 50 years    Types: Cigarettes    Quit date: 07/24/2007  . Smokeless tobacco: Never Used  . Alcohol Use: No  . Drug Use: No  . Sexually Active: Not on file   Other Topics Concern  . Not on file   Social History Narrative  . No narrative on file    Family History  Problem Relation Age of Onset  . Heart disease Father     Review of Systems:  As stated in the HPI and otherwise negative.   BP 149/81  Pulse 75  Ht 5' 10" (1.778 m)  Wt 226 lb 12.8 oz (102.876 kg)  BMI 32.54 kg/m2  Physical Examination: General: Well developed, well nourished, NAD HEENT: OP clear, mucus membranes moist SKIN: warm, dry. No rashes. Neuro: No focal deficits Musculoskeletal: Muscle strength 5/5 all ext Psychiatric: Mood and affect normal Neck: No JVD, no carotid bruits, no thyromegaly, no lymphadenopathy. Lungs:Clear bilaterally, no wheezes, rhonci, crackles Cardiovascular: Regular rate and rhythm. No murmurs, gallops or rubs. Abdomen:Soft. Bowel sounds present. Non-tender.  Extremities: No lower extremity edema. Pulses are 2 + in the bilateral DP/PT.  Cardiac cath February 2009:  Left main coronary artery: The left main coronary artery was  free of significant disease.  Left anterior  descending artery: The left anterior descending artery  gave rise to 4 diagonal branches and 2 septal perforators. The LAD was  irregular   and there was 30% proximal and 50% mid stenosis.  Circumflex artery: The circumflex artery gave rise to a ramus branch,  marginal branch and posterolateral branch. These vessels were  irregular, but there was no significant obstruction.  Right coronary artery: The right coronary artery was a moderate-sized  vessel and gave rise to a right ventricular branch, atrial branch, a  posterior descending branch and 4 posterolateral branches. There was  37% proximal and 40% mid stenosis and irregularities in the proximal,  mid and distal vessel.  Left ventriculogram: The left ventriculogram performed in the RAO  projection showed slight hypokinesis of the inferobasal wall. The  overall wall motion was good with an estimated ejection fraction of 55%.   Assessment and Plan:   1. CAD: Recent chest pains prompting ED visit.  He is known to have moderate CAD by cath 2009. Will arrange cardiac cath for 09/04/12. Risks and benefits reviewed with pt. He agrees to proceed. Labs today.        

## 2012-09-02 ENCOUNTER — Telehealth: Payer: Self-pay | Admitting: Cardiovascular Disease

## 2012-09-02 NOTE — Telephone Encounter (Signed)
Spoke with pt. Procedure cancelled for Feb. 13, 2014 and rescheduled for Sep 08, 2012 at 7:30 with Dr. Clifton James. Pt aware to arrive at short stay at 5:30 AM.

## 2012-09-02 NOTE — Telephone Encounter (Signed)
Pt wants to rs procedure 09-04-12 due to weather

## 2012-09-08 ENCOUNTER — Ambulatory Visit (HOSPITAL_COMMUNITY)
Admission: RE | Admit: 2012-09-08 | Discharge: 2012-09-08 | Disposition: A | Discharge status: Home / Self Care | Payer: Medicare Other | Source: Ambulatory Visit | Attending: Cardiovascular Disease | Admitting: Cardiovascular Disease

## 2012-09-08 ENCOUNTER — Encounter (HOSPITAL_COMMUNITY)
Admission: RE | Disposition: A | Discharge status: Home / Self Care | Payer: Self-pay | Source: Ambulatory Visit | Attending: Cardiovascular Disease

## 2012-09-08 DIAGNOSIS — R0602 Shortness of breath: Secondary | ICD-10-CM | POA: Insufficient documentation

## 2012-09-08 DIAGNOSIS — I2510 Atherosclerotic heart disease of native coronary artery without angina pectoris: Secondary | ICD-10-CM

## 2012-09-08 DIAGNOSIS — E785 Hyperlipidemia, unspecified: Secondary | ICD-10-CM | POA: Insufficient documentation

## 2012-09-08 DIAGNOSIS — R0789 Other chest pain: Secondary | ICD-10-CM | POA: Insufficient documentation

## 2012-09-08 DIAGNOSIS — I200 Unstable angina: Secondary | ICD-10-CM

## 2012-09-08 DIAGNOSIS — I10 Essential (primary) hypertension: Secondary | ICD-10-CM | POA: Insufficient documentation

## 2012-09-08 HISTORY — PX: LEFT HEART CATHETERIZATION WITH CORONARY ANGIOGRAM: SHX5451

## 2012-09-08 SURGERY — LEFT HEART CATHETERIZATION WITH CORONARY ANGIOGRAM
Anesthesia: LOCAL

## 2012-09-08 MED ORDER — SODIUM CHLORIDE 0.9 % IV SOLN: INTRAVENOUS | Status: DC

## 2012-09-08 MED ORDER — HEPARIN (PORCINE) IN NACL 2-0.9 UNIT/ML-% IJ SOLN
INTRAMUSCULAR | Status: AC
  Filled 2012-09-08: qty 1000

## 2012-09-08 MED ORDER — DIAZEPAM 5 MG PO TABS
5.0000 mg | ORAL_TABLET | ORAL | Status: AC
  Administered 2012-09-08: 5 mg via ORAL

## 2012-09-08 MED ORDER — DIAZEPAM 5 MG PO TABS
ORAL_TABLET | ORAL | Status: AC
  Filled 2012-09-08: qty 1

## 2012-09-08 MED ORDER — SODIUM CHLORIDE 0.9 % IV SOLN: 250.0000 mL | INTRAVENOUS | Status: DC | PRN

## 2012-09-08 MED ORDER — ONDANSETRON HCL 4 MG/2ML IJ SOLN: 4.0000 mg | Freq: Four times a day (QID) | INTRAMUSCULAR | Status: DC | PRN

## 2012-09-08 MED ORDER — SODIUM CHLORIDE 0.9 % IJ SOLN: 3.0000 mL | INTRAMUSCULAR | Status: DC | PRN

## 2012-09-08 MED ORDER — NITROGLYCERIN 1 MG/10 ML FOR IR/CATH LAB
INTRA_ARTERIAL | Status: AC
  Filled 2012-09-08: qty 10

## 2012-09-08 MED ORDER — ASPIRIN 81 MG PO CHEW
CHEWABLE_TABLET | ORAL | Status: AC
  Filled 2012-09-08: qty 4

## 2012-09-08 MED ORDER — ASPIRIN 81 MG PO CHEW
324.0000 mg | CHEWABLE_TABLET | ORAL | Status: AC
  Administered 2012-09-08: 324 mg via ORAL

## 2012-09-08 MED ORDER — SODIUM CHLORIDE 0.9 % IV SOLN
INTRAVENOUS | Status: DC
  Administered 2012-09-08: 07:00:00 via INTRAVENOUS

## 2012-09-08 MED ORDER — LIDOCAINE HCL (PF) 1 % IJ SOLN
INTRAMUSCULAR | Status: AC
  Filled 2012-09-08: qty 30

## 2012-09-08 MED ORDER — SODIUM CHLORIDE 0.9 % IJ SOLN: 3.0000 mL | Freq: Two times a day (BID) | INTRAMUSCULAR | Status: DC

## 2012-09-08 MED ORDER — MIDAZOLAM HCL 2 MG/2ML IJ SOLN
INTRAMUSCULAR | Status: AC
  Filled 2012-09-08: qty 2

## 2012-09-08 MED ORDER — FENTANYL CITRATE 0.05 MG/ML IJ SOLN
INTRAMUSCULAR | Status: AC
  Filled 2012-09-08: qty 2

## 2012-09-08 MED ORDER — ACETAMINOPHEN 325 MG PO TABS: 650.0000 mg | ORAL_TABLET | ORAL | Status: DC | PRN

## 2012-09-08 NOTE — CV Procedure (Signed)
   Cardiac Catheterization Operative Report  [name] [id number] 2/17/20148:15 AM [name], MD  Procedure Performed:  1. Left Heart Catheterization 2. Selective Coronary Angiography 3. Left ventricular angiogram  Operator: Verne Carrow, MD  Indication:  72 yo WM with history of CAD, HTN, HLD, OSA, bradycardia here today for cardiac cath. He has been followed in the past by Dr. Juanda Chance. Catheterization in 2009 showed nonobstructive CAD (50% LAD stenosis, 40% RCA stenosis). Echocardiogram November 2012 showed normal LV function and normal wall motion. He does not tolerate statins. He was seen in the ED on 08/19/12 with chest pain by Dr. Gala Romney and sent home with plans for outpatient f/u. Troponin was normal and EKG was unchanged. He had been arguing with a friend that day. He began to have pain that lasted for 4 hours. The pain was severe. He is intolerant of statins. I saw him in the office 08/27/12 and he c/o chest pain and SOB with strenuous activities. He has 100 pack years of tobacco abuse but stopped smoking 2009.                              Procedure Details: The risks, benefits, complications, treatment options, and expected outcomes were discussed with the patient. The patient and/or family concurred with the proposed plan, giving informed consent. The patient was brought to the cath lab after IV hydration was begun and oral premedication was given. The patient was further sedated with Versed and Fentanyl. Allens test negative on right wrist. The right groin was prepped and draped in the usual manner. Using the modified Seldinger access technique, a 5 French sheath was placed in the right femoral artery. Standard diagnostic catheters were used to perform selective coronary angiography. A pigtail catheter was used to perform a left ventricular angiogram.  There were no immediate complications. The patient was taken to the recovery area in stable condition.    Hemodynamic Findings: Central aortic pressure: 118/68 Left ventricular pressure: 128/8/15  Angiographic Findings:  Left main: No obstructive disease.   Left Anterior Descending Artery: Large caliber vessel that courses to the apex. There is an eccentric 50% stenosis in the proximal vessel. There is a 30% stenosis in the mid vessel just after a moderate caliber diagonal vessel. The distal vessel has mild plaque. The first two diagonal branches are small in caliber. The third diagonal branch is moderate in caliber and has mild plaque disease.   Circumflex Artery: Moderate caliber vessel with termination into a moderate caliber first obtuse marginal branch. The OM branch has 30% stenosis.   Right Coronary Artery: Large dominant vessel with mild proximal plaque, 40% mid stenosis and mild plaque distally.   Left Ventricular Angiogram: LVEF=50-55%.   Impression: 1. Moderate non-obstructive CAD 2. Normal LV systolic function 3. Non-cardiac chest pain.   Recommendations: Will continue medical management of his CAD. He is intolerant of statins and beta blockers. Will have him see primary care for pulmonary evaluation.        Complications:  None. The patient tolerated the procedure well.

## 2012-09-08 NOTE — Interval H&P Note (Signed)
History and Physical Interval Note:  09/08/2012 7:33 AM  [name]  has presented today for cardiac cath with the diagnosis of Chest pain/CAD.  The various methods of treatment have been discussed with the patient and family. After consideration of risks, benefits and other options for treatment, the patient has consented to  Procedure(s): LEFT HEART CATHETERIZATION WITH CORONARY ANGIOGRAM (N/A) as a surgical intervention .  The patient's history has been reviewed, patient examined, no change in status, stable for surgery.  I have reviewed the patient's chart and labs.  Questions were answered to the patient's satisfaction.     [name]

## 2012-09-08 NOTE — H&P (View-Only) (Signed)
History of Present Illness: 72 yo WM with history of CAD, HTN, HLD, OSA, bradycardia here today for cardiac follow up. He has been followed in the past by Dr. Juanda Chance. Catheterization in 2009 showed nonobstructive CAD (50% LAD stenosis, 40% RCA stenosis). October 2011 he was admitted from the GI office was in acute GI bleeding. He required transfusions. He was seen by Dr. Clent Ridges on 05/23/11 and was started on lasix for lower extremity edema and SOB. His breathing was much better on lasix. His lower extremity edema resolved. Echocardiogram November 2012 showed normal LV function and normal wall motion. He does not tolerate statins. He was seen in the ED on 08/19/12 with chest pain by Dr. Gala Romney and sent home with plans for outpatient f/u. Troponin was normal and EKG was unchanged. He had been arguing with a friend that day. He began to have pain that lasted for 4 hours. The pain was severe. He is intolerant of statins.   He is here today for follow up. He gets easily SOB with strenuous activities. He has 100 pack years of tobacco abuse but stopped smoking 2009. He tells me that he has been having chest pains. These are exertional. Also fatigue and total lack of energy. He is out of breath walking to the mailbox.   Primary Care Physician: Gershon Crane  Last Lipid Profile:Lipid Panel     Component Value Date/Time   CHOL 176 07/31/2011 1040   TRIG 148.0 07/31/2011 1040   HDL 39.90 07/31/2011 1040   CHOLHDL 4 07/31/2011 1040   VLDL 29.6 07/31/2011 1040   LDLCALC 107* 07/31/2011 1040     Past Medical History  Diagnosis Date  . Anemia   . Hyperlipidemia   . Hypertension   . Allergy   . Myocardial infarction   . Ulcer   . GERD (gastroesophageal reflux disease)   . Foot pain     left  . Restless leg syndrome   . Hypothyroid   . Headache   . Obstructive sleep apnea   . Fatigue   . CAD (coronary artery disease)   . Gastritis   . Bronchitis   . Back pain     low  . Diverticulosis   . Colon polyps       Past Surgical History  Procedure Date  . Appendectomy   . Hernia repair   . Tonsillectomy   . Ankle surgery     left   . Colonoscopy 08-23-05    per Dr. Arlyce Dice, adenomatous polyps, repeat in 5 yrs   . Esophagogastroduodenoscopy 08-23-05    per Dr. Arlyce Dice, cauterized jejunal AVMs     Current Outpatient Prescriptions  Medication Sig Dispense Refill  . amLODipine (NORVASC) 5 MG tablet Take 5 mg by mouth daily.      . Ascorbic Acid (VITAMIN C) 1000 MG tablet Take 1,000 mg by mouth daily.        [name] aspirin 81 MG tablet Take 81 mg by mouth daily.        . Calcium-Magnesium-Vitamin D (CALCIUM MAGNESIUM PO) Take 1 tablet by mouth daily.        Jennette Banker Sodium 30-100 MG CAPS Take 1 capsule by mouth as needed. For constipation      . clonazePAM (KLONOPIN) 0.5 MG tablet Take 1 tablet (0.5 mg total) by mouth 3 (three) times daily as needed for anxiety.  270 tablet  1  . cyclobenzaprine (FLEXERIL) 10 MG tablet Take 10 mg by mouth 3 (three) times daily  as needed. For muscle spasms      . diclofenac sodium (VOLTAREN) 1 % GEL Apply 1 application topically 4 (four) times daily.  5 Tube  10  . ferrous sulfate 325 (65 FE) MG tablet Take 325 mg by mouth daily with breakfast.       . fish oil-omega-3 fatty acids 1000 MG capsule Take 2 g by mouth daily.        . fluticasone (FLONASE) 50 MCG/ACT nasal spray Place 2 sprays into the nose daily.      [name] HYDROcodone-acetaminophen (NORCO/VICODIN) 5-325 MG per tablet Take 1 tablet by mouth every 6 (six) hours as needed for pain.  120 tablet  5  . levothyroxine (SYNTHROID, LEVOTHROID) 150 MCG tablet Take 150 mcg by mouth daily.      . nitroGLYCERIN (NITROSTAT) 0.4 MG SL tablet Place 1 tablet (0.4 mg total) under the tongue every 5 (five) minutes as needed for chest pain. For chest pain  25 tablet  6  . omeprazole (PRILOSEC) 20 MG capsule Take 1 capsule (20 mg total) by mouth 2 (two) times daily.  180 capsule  3  . oxyCODONE-acetaminophen (PERCOCET)  10-650 MG per tablet Take 1 tablet by mouth every 6 (six) hours as needed for pain. As needed  120 tablet  0  . rOPINIRole (REQUIP) 2 MG tablet Take 4 mg by mouth at bedtime.        Allergies  Allergen Reactions  . Lipitor (Atorvastatin Calcium)     myalgias  . Metoprolol Tartrate   . Shellfish Allergy   . Simvastatin     myalgias  . Trazodone And Nefazodone     Unsteady on feet  . Zolpidem     Chest pain  . Amoxicillin-Pot Clavulanate Rash    History   Social History  . Marital Status: Married    Spouse Name: N/A    Number of Children: N/A  . Years of Education: N/A   Occupational History  . Disabled    Social History Main Topics  . Smoking status: Former Smoker -- 2.0 packs/day for 50 years    Types: Cigarettes    Quit date: 07/24/2007  . Smokeless tobacco: Never Used  . Alcohol Use: No  . Drug Use: No  . Sexually Active: Not on file   Other Topics Concern  . Not on file   Social History Narrative  . No narrative on file    Family History  Problem Relation Age of Onset  . Heart disease Father     Review of Systems:  As stated in the HPI and otherwise negative.   BP 149/81  Pulse 75  Ht 5\' 10"  (1.778 m)  Wt 226 lb 12.8 oz (102.876 kg)  BMI 32.54 kg/m2  Physical Examination: General: Well developed, well nourished, NAD HEENT: OP clear, mucus membranes moist SKIN: warm, dry. No rashes. Neuro: No focal deficits Musculoskeletal: Muscle strength 5/5 all ext Psychiatric: Mood and affect normal Neck: No JVD, no carotid bruits, no thyromegaly, no lymphadenopathy. Lungs:Clear bilaterally, no wheezes, rhonci, crackles Cardiovascular: Regular rate and rhythm. No murmurs, gallops or rubs. Abdomen:Soft. Bowel sounds present. Non-tender.  Extremities: No lower extremity edema. Pulses are 2 + in the bilateral DP/PT.  Cardiac cath February 2009:  Left main coronary artery: The left main coronary artery was  free of significant disease.  Left anterior  descending artery: The left anterior descending artery  gave rise to 4 diagonal branches and 2 septal perforators. The LAD was  irregular  and there was 30% proximal and 50% mid stenosis.  Circumflex artery: The circumflex artery gave rise to a ramus branch,  marginal branch and posterolateral branch. These vessels were  irregular, but there was no significant obstruction.  Right coronary artery: The right coronary artery was a moderate-sized  vessel and gave rise to a right ventricular branch, atrial branch, a  posterior descending branch and 4 posterolateral branches. There was  37% proximal and 40% mid stenosis and irregularities in the proximal,  mid and distal vessel.  Left ventriculogram: The left ventriculogram performed in the RAO  projection showed slight hypokinesis of the inferobasal wall. The  overall wall motion was good with an estimated ejection fraction of 55%.   Assessment and Plan:   1. CAD: Recent chest pains prompting ED visit.  He is known to have moderate CAD by cath 2009. Will arrange cardiac cath for 09/04/12. Risks and benefits reviewed with pt. He agrees to proceed. Labs today.

## 2012-09-12 ENCOUNTER — Encounter (INDEPENDENT_AMBULATORY_CARE_PROVIDER_SITE_OTHER): Payer: Medicare Other

## 2012-09-12 ENCOUNTER — Telehealth: Payer: Self-pay | Admitting: Cardiovascular Disease

## 2012-09-12 ENCOUNTER — Encounter: Payer: Self-pay | Admitting: Cardiology

## 2012-09-12 DIAGNOSIS — M79609 Pain in unspecified limb: Secondary | ICD-10-CM

## 2012-09-12 DIAGNOSIS — R1909 Other intra-abdominal and pelvic swelling, mass and lump: Secondary | ICD-10-CM

## 2012-09-12 NOTE — Telephone Encounter (Signed)
**Note De-Identified  Obfuscation** Pt had cath with Dr. Clifton James on Monday 2/17 and is c/o pain at cath site. He denies swelling, redness or heat at site but c/o sm. knot that is tender and sore to the touch, pain in the back of both legs from hips to knees and states that he has been "walking the floors in pain for the last 3 nights". Pt has had prior caths in the past but he states he never had this pain with the other caths. Pt. Agrees to come to office today @ 12 pm for a lower extremity bilateral pseudoaneurysm doppler. Note forwarded to Dr. Myrtis Ser (DOD).

## 2012-09-12 NOTE — Telephone Encounter (Signed)
Calling re pain in groin and legs since heart cath, pls advise

## 2012-09-12 NOTE — Progress Notes (Signed)
   The patient recently underwent cardiac catheterization. He called and spoke to the message nurse about ongoing discomfort at the cath site in his right groin. He was brought to the office to rule out a pseudoaneurysm. This study showed no pseudoaneurysm. I was*view the situation. I brought the patient to in exam room. I did not check him in for a formal visit.  He told me that the day after his catheterization he had discomfort that actually occurs in the back of both legs. He also had some discomfort at the cath site. I did examine the cath site. There was no significant abnormality. I did not do a complete neurologic exam of his legs. It seems that he may have had the independent beginning of some low back discomfort that is causing him pain. I encouraged him to see his primary care physician soon.  Jerral Bonito, MD

## 2012-09-17 ENCOUNTER — Telehealth: Payer: Self-pay | Admitting: *Deleted

## 2012-09-17 NOTE — Telephone Encounter (Signed)
Dr. Henrietta Hoover note from 2/21 reviewed. I called pt to see how he was feeling. Left message to call back

## 2012-10-01 ENCOUNTER — Ambulatory Visit (INDEPENDENT_AMBULATORY_CARE_PROVIDER_SITE_OTHER): Payer: Medicare Other | Admitting: Cardiovascular Disease

## 2012-10-01 ENCOUNTER — Encounter: Payer: Self-pay | Admitting: Cardiovascular Disease

## 2012-10-01 VITALS — BP 133/80 | HR 69 | Ht 70.0 in | Wt 227.0 lb

## 2012-10-01 DIAGNOSIS — I2510 Atherosclerotic heart disease of native coronary artery without angina pectoris: Secondary | ICD-10-CM

## 2012-10-01 NOTE — Patient Instructions (Addendum)
Your physician wants you to follow-up in:  6 months. You will receive a reminder letter in the mail two months in advance. If you don't receive a letter, please call our office to schedule the follow-up appointment.   Your physician recommends that you return for fasting lab work later this week or next week--Lipid profile

## 2012-10-01 NOTE — Telephone Encounter (Signed)
Pt saw Dr. McAlhany today 

## 2012-10-01 NOTE — Progress Notes (Signed)
History of Present Illness: 72 yo WM with history of CAD, HTN, HLD, OSA, bradycardia here today for cardiac follow up. He has been followed in the past by Dr. Juanda Chance. Catheterization in 2009 showed nonobstructive CAD (50% LAD stenosis, 40% RCA stenosis). October 2011 he was admitted from the GI office was in acute GI bleeding. He required transfusions. He was seen by Dr. Clent Ridges on 05/23/11 and was started on lasix for lower extremity edema and SOB. His breathing was much better on lasix. His lower extremity edema resolved. Echocardiogram November 2012 showed normal LV function and normal wall motion. He does not tolerate statins. He was seen in the ED on 08/19/12 with chest pain by Dr. Gala Romney and sent home with plans for outpatient f/u. Troponin was normal and EKG was unchanged. He had been arguing with a friend that day. He began to have pain that lasted for 4 hours. The pain was severe. He is intolerant of statins.  I saw him 08/27/12 in the office and arranged a cardiac cath on 09/08/12.  He was found to have mild to moderate non-obstructive CAD. No PCI was needed. His right groin was very painful after the cath. Arterial doppler without any evidence of pseudoaneurysm or AV fistula.   He is here today for follow up.  He feels great. No chest pain. No SOB.   Primary Care Physician: Gershon Crane  Last Lipid Profile:Lipid Panel     Component Value Date/Time   CHOL 176 07/31/2011 1040   TRIG 148.0 07/31/2011 1040   HDL 39.90 07/31/2011 1040   CHOLHDL 4 07/31/2011 1040   VLDL 29.6 07/31/2011 1040   LDLCALC 107* 07/31/2011 1040     Past Medical History  Diagnosis Date  . Anemia   . Hyperlipidemia   . Hypertension   . Allergy   . Myocardial infarction   . Ulcer   . GERD (gastroesophageal reflux disease)   . Foot pain     left  . Restless leg syndrome   . Hypothyroid   . Headache   . Obstructive sleep apnea   . Fatigue   . CAD (coronary artery disease)   . Gastritis   . Bronchitis   . Back pain      low  . Diverticulosis   . Colon polyps     Past Surgical History  Procedure Laterality Date  . Appendectomy    . Hernia repair    . Tonsillectomy    . Ankle surgery      left   . Colonoscopy  08-23-05    per Dr. Arlyce Dice, adenomatous polyps, repeat in 5 yrs   . Esophagogastroduodenoscopy  08-23-05    per Dr. Arlyce Dice, cauterized jejunal AVMs     Current Outpatient Prescriptions  Medication Sig Dispense Refill  . amLODipine (NORVASC) 5 MG tablet Take 5 mg by mouth daily.      . Ascorbic Acid (VITAMIN C) 1000 MG tablet Take 1,000 mg by mouth daily.        [name] aspirin 81 MG tablet Take 81 mg by mouth daily.        . Calcium-Magnesium-Vitamin D (CALCIUM MAGNESIUM PO) Take 1 tablet by mouth daily.        . clonazePAM (KLONOPIN) 0.5 MG tablet Take 1 tablet (0.5 mg total) by mouth 3 (three) times daily as needed for anxiety.  270 tablet  1  . cyclobenzaprine (FLEXERIL) 10 MG tablet Take 10 mg by mouth 3 (three) times daily as needed. For muscle  spasms      . diclofenac sodium (VOLTAREN) 1 % GEL Apply 1 application topically 4 (four) times daily.  5 Tube  10  . fish oil-omega-3 fatty acids 1000 MG capsule Take 2 g by mouth daily.        . fluticasone (FLONASE) 50 MCG/ACT nasal spray Place 2 sprays into the nose daily.      [name] HYDROcodone-acetaminophen (NORCO/VICODIN) 5-325 MG per tablet Take 1 tablet by mouth every 6 (six) hours as needed for pain.  120 tablet  5  . levothyroxine (SYNTHROID, LEVOTHROID) 150 MCG tablet Take 150 mcg by mouth daily.      . nitroGLYCERIN (NITROSTAT) 0.4 MG SL tablet Place 1 tablet (0.4 mg total) under the tongue every 5 (five) minutes as needed for chest pain. For chest pain  25 tablet  6  . omeprazole (PRILOSEC) 20 MG capsule Take 1 capsule (20 mg total) by mouth 2 (two) times daily.  180 capsule  3  . oxyCODONE-acetaminophen (PERCOCET) 10-650 MG per tablet Take 1 tablet by mouth every 6 (six) hours as needed for pain. As needed  120 tablet  0  . rOPINIRole (REQUIP)  2 MG tablet Take 4 mg by mouth at bedtime.       No current facility-administered medications for this visit.    Allergies  Allergen Reactions  . Lipitor (Atorvastatin Calcium)     myalgias  . Metoprolol Tartrate   . Shellfish Allergy   . Simvastatin     myalgias  . Trazodone And Nefazodone     Unsteady on feet  . Zolpidem     Chest pain  . Amoxicillin-Pot Clavulanate Rash    History   Social History  . Marital Status: Married    Spouse Name: N/A    Number of Children: N/A  . Years of Education: N/A   Occupational History  . Disabled    Social History Main Topics  . Smoking status: Former Smoker -- 2.00 packs/day for 50 years    Types: Cigarettes    Quit date: 07/24/2007  . Smokeless tobacco: Never Used  . Alcohol Use: No  . Drug Use: No  . Sexually Active: Not on file   Other Topics Concern  . Not on file   Social History Narrative  . No narrative on file    Family History  Problem Relation Age of Onset  . Heart disease Father     Review of Systems:  As stated in the HPI and otherwise negative.   BP 133/80  Pulse 69  Ht 5\' 10"  (1.778 m)  Wt 227 lb (102.967 kg)  BMI 32.57 kg/m2  Physical Examination: General: Well developed, well nourished, NAD HEENT: OP clear, mucus membranes moist SKIN: warm, dry. No rashes. Neuro: No focal deficits Musculoskeletal: Muscle strength 5/5 all ext Psychiatric: Mood and affect normal Neck: No JVD, no carotid bruits, no thyromegaly, no lymphadenopathy. Lungs:Clear bilaterally, no wheezes, rhonci, crackles Cardiovascular: Regular rate and rhythm. No murmurs, gallops or rubs. Abdomen:Soft. Bowel sounds present. Non-tender.  Extremities: No lower extremity edema. Pulses are 2 + in the bilateral DP/PT.  Cardiac cath 09/08/12:  Hemodynamic Findings:  Central aortic pressure: 118/68  Left ventricular pressure: 128/8/15  Angiographic Findings:  Left main: No obstructive disease.  Left Anterior Descending Artery:  Large caliber vessel that courses to the apex. There is an eccentric 50% stenosis in the proximal vessel. There is a 30% stenosis in the mid vessel just after a moderate caliber diagonal vessel. The  distal vessel has mild plaque. The first two diagonal branches are small in caliber. The third diagonal branch is moderate in caliber and has mild plaque disease.  Circumflex Artery: Moderate caliber vessel with termination into a moderate caliber first obtuse marginal branch. The OM branch has 30% stenosis.  Right Coronary Artery: Large dominant vessel with mild proximal plaque, 40% mid stenosis and mild plaque distally.  Left Ventricular Angiogram: LVEF=50-55%.  Impression:  1. Moderate non-obstructive CAD  2. Normal LV systolic function  3. Non-cardiac chest pain.    Assessment and Plan:   1. CAD: Moderate disease by cath February 2014. He does not tolerate statins. He does not tolerate beta blockers. Continue ASA. Will repeat fasting lipids.

## 2012-10-02 ENCOUNTER — Telehealth: Payer: Self-pay | Admitting: Family Medicine

## 2012-10-02 NOTE — Telephone Encounter (Signed)
Pt stated pharm said he needs to cut down on tylenol in oxycodone to oxycodone 10-325 instead of oxycodone 10-650mg . Pt also takes hydrocodone. Pt needs new rx oxycodone

## 2012-10-03 MED ORDER — OXYCODONE-ACETAMINOPHEN 10-325 MG PO TABS: 1.0000 | ORAL_TABLET | Freq: Four times a day (QID) | ORAL | Status: DC | PRN

## 2012-10-03 NOTE — Telephone Encounter (Signed)
Script is ready for pick up and left voice message. 

## 2012-10-03 NOTE — Telephone Encounter (Signed)
Please see below note

## 2012-10-03 NOTE — Telephone Encounter (Signed)
done

## 2012-10-08 ENCOUNTER — Other Ambulatory Visit (INDEPENDENT_AMBULATORY_CARE_PROVIDER_SITE_OTHER): Payer: Medicare Other

## 2012-10-08 DIAGNOSIS — I2510 Atherosclerotic heart disease of native coronary artery without angina pectoris: Secondary | ICD-10-CM

## 2012-10-08 LAB — LIPID PANEL
Cholesterol: 159 mg/dL (ref 0–200)
LDL Cholesterol: 96 mg/dL (ref 0–99)
Total CHOL/HDL Ratio: 5

## 2012-10-21 ENCOUNTER — Telehealth: Payer: Self-pay | Admitting: Cardiovascular Disease

## 2012-10-21 NOTE — Telephone Encounter (Signed)
Pt aware of lab results 

## 2012-10-21 NOTE — Telephone Encounter (Signed)
Left pt a message to call back. 

## 2012-10-21 NOTE — Telephone Encounter (Signed)
New problem     Per pt returning call from someone yesterday

## 2012-12-01 ENCOUNTER — Telehealth: Payer: Self-pay | Admitting: Family Medicine

## 2012-12-01 MED ORDER — OXYCODONE-ACETAMINOPHEN 10-325 MG PO TABS: 1.0000 | ORAL_TABLET | Freq: Four times a day (QID) | ORAL | Status: DC | PRN

## 2012-12-01 NOTE — Telephone Encounter (Signed)
Pt requesting refill on oxyCODONE-acetaminophen (PERCOCET) 10-325 MG per tablet Please call when ready for pick up.

## 2012-12-01 NOTE — Telephone Encounter (Signed)
Script is ready for pick up and left voice message for pt. 

## 2012-12-01 NOTE — Telephone Encounter (Signed)
done

## 2012-12-07 ENCOUNTER — Other Ambulatory Visit: Payer: Self-pay | Admitting: Family Medicine

## 2012-12-08 NOTE — Telephone Encounter (Signed)
Can we refill this? 

## 2013-01-29 ENCOUNTER — Telehealth: Payer: Self-pay | Admitting: Family Medicine

## 2013-01-29 NOTE — Telephone Encounter (Signed)
Pt needs new rx oxycodone 10-325 mg. Pt is aware MD out of office this wk

## 2013-02-02 ENCOUNTER — Telehealth: Payer: Self-pay | Admitting: Family Medicine

## 2013-02-02 NOTE — Telephone Encounter (Signed)
PT called to request a refill of his oxyCODONE-acetaminophen (PERCOCET) 10-325 MG per tablet. Please assist.

## 2013-02-03 NOTE — Telephone Encounter (Signed)
Can rx  6  pills of percocet .  Or he can use the hydrocodone  He already has instead.  Either way  Further refills of medication per dr Clent Ridges when back in office

## 2013-02-03 NOTE — Telephone Encounter (Signed)
Pt following up on oxyCODONE-acetaminophen (PERCOCET) 10-325 MG per tablet request. Pls advise. Ok to leave message.

## 2013-02-03 NOTE — Telephone Encounter (Signed)
I spoke with pt and he takes 1-2 of the Percocet at night for back & leg pain, has a couple of pills left. He has plenty of the Norco because he only takes that in the mornings as needed.

## 2013-02-03 NOTE — Telephone Encounter (Signed)
It looks like he is on hydrocodone and oxycodone  Please contact patient  What is he taking  How is he taking and  How many pills left of each.

## 2013-02-04 NOTE — Telephone Encounter (Signed)
I spoke with pt and we wait until Dr. Clent Ridges returns to the office. He is completely out and can we call when ready for pick up.

## 2013-02-05 ENCOUNTER — Other Ambulatory Visit: Payer: Self-pay | Admitting: Family Medicine

## 2013-02-05 NOTE — Telephone Encounter (Signed)
Can we refill this? 

## 2013-02-06 ENCOUNTER — Other Ambulatory Visit: Payer: Self-pay | Admitting: Family Medicine

## 2013-02-06 MED ORDER — OXYCODONE-ACETAMINOPHEN 10-325 MG PO TABS: 1.0000 | ORAL_TABLET | Freq: Four times a day (QID) | ORAL | Status: DC | PRN

## 2013-02-06 NOTE — Telephone Encounter (Signed)
done

## 2013-02-06 NOTE — Telephone Encounter (Signed)
Script is ready for pick up and I left message. 

## 2013-02-17 ENCOUNTER — Other Ambulatory Visit: Payer: Self-pay | Admitting: Family Medicine

## 2013-02-18 NOTE — Telephone Encounter (Signed)
Call in #[phone] with one rf 

## 2013-02-23 ENCOUNTER — Other Ambulatory Visit: Payer: Self-pay | Admitting: Family Medicine

## 2013-02-23 ENCOUNTER — Ambulatory Visit (INDEPENDENT_AMBULATORY_CARE_PROVIDER_SITE_OTHER): Payer: Self-pay | Admitting: Family Medicine

## 2013-02-23 ENCOUNTER — Encounter: Payer: Self-pay | Admitting: Family Medicine

## 2013-02-23 ENCOUNTER — Ambulatory Visit (INDEPENDENT_AMBULATORY_CARE_PROVIDER_SITE_OTHER)
Admission: RE | Admit: 2013-02-23 | Discharge: 2013-02-23 | Disposition: A | Discharge status: Home / Self Care | Payer: Medicare Other | Source: Ambulatory Visit | Attending: Family Medicine | Admitting: Family Medicine

## 2013-02-23 VITALS — BP 150/82 | HR 79 | Temp 98.2°F | Wt 220.0 lb

## 2013-02-23 DIAGNOSIS — R109 Unspecified abdominal pain: Secondary | ICD-10-CM

## 2013-02-23 LAB — CBC WITH DIFFERENTIAL/PLATELET
Eosinophils Absolute: 0.7 10*3/uL (ref 0.0–0.7)
HCT: 49.4 % (ref 39.0–52.0)
Lymphs Abs: 2.9 10*3/uL (ref 0.7–4.0)
MCHC: 33.7 g/dL (ref 30.0–36.0)
MCV: 93.7 fl (ref 78.0–100.0)
Monocytes Absolute: 1.1 10*3/uL — ABNORMAL HIGH (ref 0.1–1.0)
Neutrophils Relative %: 59.8 % (ref 43.0–77.0)
Platelets: 287 10*3/uL (ref 150.0–400.0)

## 2013-02-23 LAB — POCT URINALYSIS DIPSTICK
Leukocytes, UA: NEGATIVE
Protein, UA: NEGATIVE
Spec Grav, UA: >=1.03
Urobilinogen, UA: 1
pH, UA: 6

## 2013-02-23 LAB — BASIC METABOLIC PANEL
Chloride: 103 mEq/L (ref 96–112)
GFR: 69.34 mL/min (ref >60.00)
Potassium: 4.4 mEq/L (ref 3.5–5.1)
Sodium: 140 mEq/L (ref 135–145)

## 2013-02-23 LAB — HEPATIC FUNCTION PANEL
ALT: 21 U/L (ref 0–53)
AST: 19 U/L (ref 0–37)
Alkaline Phosphatase: 60 U/L (ref 39–117)
Bilirubin, Direct: 0.1 mg/dL (ref 0.0–0.3)
Total Bilirubin: 0.9 mg/dL (ref 0.3–1.2)

## 2013-02-23 LAB — AMYLASE: Amylase: 64 U/L (ref 27–131)

## 2013-02-23 MED ORDER — IOHEXOL 300 MG/ML  SOLN
80.0000 mL | Freq: Once | INTRAMUSCULAR | Status: AC | PRN
  Administered 2013-02-23: 80 mL via INTRAVENOUS

## 2013-02-23 NOTE — Progress Notes (Signed)
  Subjective:    Patient ID: [name], male    DOB: [birth year], 72 y.o.   MRN: [id number]  HPI Here for 5 days of constant, at times fairly severe, right flank pains. This was made worse several days ago when his 74 yr old grandson fell onto his abdomen while they were playing. The pain is worse during certain body moves and when taking a deep breath. No SOB or fever or nausea. No change in bowel movements or urinations.    Review of Systems  Constitutional: Negative.   Respiratory: Negative.   Cardiovascular: Negative.   Gastrointestinal: Positive for abdominal pain. Negative for nausea, vomiting, diarrhea, constipation, blood in stool and abdominal distention.  Genitourinary: Negative.        Objective:   Physical Exam  Constitutional: He appears well-developed and well-nourished.  Cardiovascular: Normal rate, regular rhythm, normal heart sounds and intact distal pulses.   Pulmonary/Chest: Effort normal and breath sounds normal.  Abdominal: Soft. Bowel sounds are normal. He exhibits distension. He exhibits no mass. There is no rebound and no guarding.  Moderately tender in the right flank, the RUQ, and the RLQ.           Assessment & Plan:  Right flank pain of uncertain etiology. Get labs today as well as a contrasted CT of abdomen and pelvis.

## 2013-02-23 NOTE — Progress Notes (Signed)
Quick Note:  I spoke with pt ______ 

## 2013-02-23 NOTE — Addendum Note (Signed)
Addended by: Gershon Crane A on: 02/23/2013 04:41 PM   Modules accepted: Orders

## 2013-02-24 ENCOUNTER — Telehealth: Payer: Self-pay | Admitting: Gastroenterology

## 2013-02-24 NOTE — Telephone Encounter (Signed)
Call in #120 with 5 rf 

## 2013-02-24 NOTE — Telephone Encounter (Signed)
Left message for pt to call back  °

## 2013-02-25 NOTE — Telephone Encounter (Signed)
Pt complaining of pain and some swelling on the right side of his abdomen. Pt requesting to be seen. Pt scheduled to see Willette Cluster NP tomorrow at [facility]. Pt aware of appt.

## 2013-02-26 ENCOUNTER — Ambulatory Visit (INDEPENDENT_AMBULATORY_CARE_PROVIDER_SITE_OTHER): Payer: Medicare Other | Admitting: Nurse Practitioner

## 2013-02-26 ENCOUNTER — Encounter: Payer: Self-pay | Admitting: Nurse Practitioner

## 2013-02-26 VITALS — BP 120/70 | HR 76 | Ht 69.25 in | Wt 218.1 lb

## 2013-02-26 DIAGNOSIS — IMO0001 Reserved for inherently not codable concepts without codable children: Secondary | ICD-10-CM

## 2013-02-26 DIAGNOSIS — M7918 Myalgia, other site: Secondary | ICD-10-CM

## 2013-02-26 NOTE — Patient Instructions (Addendum)
You have been scheduled for a MRI of the lumbar spine at [facility] on 03/03/13 at 10:00am in the Radiology department. Please arrive 15 minutes prior for your appointment. There is no special prep for this test.                                                We are excited to introduce MyChart, a new best-in-class service that provides you online access to important information in your electronic medical record. We want to make it easier for you to view your health information - all in one secure location - when and where you need it. We expect MyChart will enhance the quality of care and service we provide.  When you register for MyChart, you can:    View your test results.    Request appointments and receive appointment reminders via email.    Request medication renewals.    View your medical history, allergies, medications and immunizations.    Communicate with your physician's office through a password-protected site.    Conveniently print information such as your medication lists.  To find out if MyChart is right for you, please talk to a member of our clinical staff today. We will gladly answer your questions about this free health and wellness tool.  If you are age 71 or older and want a member of your family to have access to your record, you must provide written consent by completing a proxy form available at our office. Please speak to our clinical staff about guidelines regarding accounts for patients younger than age 13.  As you activate your MyChart account and need any technical assistance, please call the MyChart technical support line at [phone]) or email your question to [email or web address]. If you email your question(s), please include your name, a return phone number and the best time to reach you.  If you have non-urgent health-related questions, you can send a message to our office through MyChart at [email or web address]. If you  have a medical emergency, call 911.  Thank you for using MyChart as your new health and wellness resource!   MyChart licensed from [employer],  [id number]. Patents Pending.

## 2013-02-26 NOTE — Progress Notes (Signed)
History of Present Illness:  Patient is a 72 year old male known to Dr. Arlyce Dice. He has a history of iron deficiency anemia, small bowel AVMs, diverticulosis and adenomatous colon polyps (Feb 2007). Patient is referred today for evaluation of abdominal pain. CT scan of the abdomen and pelvis with contrast a few days ago was unremarkable. Labs including basic metabolic profile, liver function studies, amylase and lipase and urinalysis were all unremarkable. CBC unremarkable except for mildly elevated white count of 11.7.  Three to four weeks ago patient's 74-year-old grandson  abruptly fell onto patient's right abdomen while they were taking a nap together . The patient experienced severe pain for about an hour then felt okay until this past weekend when he developed severe right-sided abdominal pain. He noticed bulging of the right abdomen. Pain exacerbated by movement, deep breaths. No pain when lying or sitting still. Pain is not related to bowel movements or eating. No associated bowel changes. No nausea. Patient's right-sided pain continued through the weekend, he saw PCP on Monday. Labs and CT scan results as above. Patient continued to have severe pain until Tuesday but yesterday and today feels somewhat better. Patient has been using a heating pad to the right abdomen and this has helped. No lower extremity weakness.  Current Medications, Allergies, Past Medical History, Past Surgical History, Family History and Social History were reviewed in [employer] record.  Studies:   Ct Abdomen Pelvis W Contrast  02/23/2013   *RADIOLOGY REPORT*  Clinical Data: Right-sided flank pain.  CT ABDOMEN AND PELVIS WITH CONTRAST  Technique:  Multidetector CT imaging of the abdomen and pelvis was performed following the standard protocol during bolus administration of intravenous contrast.  Contrast: 80mL OMNIPAQUE IOHEXOL 300 MG/ML  SOLN  Comparison: 08/02/2005  Findings: Minimal dependent  bibasilar atelectasis. Coronary arterial calcification re-identified.  Liver, gallbladder, adrenal glands, kidneys, spleen, and pancreas are normal.  No ascites or lymphadenopathy.  No free air.  Appendix surgically absent.  No bowel wall thickening or focal segmental dilatation. Scattered colonic diverticuli noted without evidence for diverticulitis.  Prostate is mildly inhomogeneous and prominent at 5.0 cm transversely image 94.  Bladder is normal.  No radiopaque renal, ureteral, or bladder calculus.  Atheromatous aortic calcification without aneurysm.  No acute osseous abnormality.  Lumbar spine disc degenerative change.  IMPRESSION: No acute intra-abdominal or pelvic pathology.   Original Report Authenticated By: Christiana Pellant, M.D.   Physical Exam: General: Well developed , white male in no acute distress Head: Normocephalic and atraumatic Eyes:  sclerae anicteric, conjunctiva pink  Ears: Normal auditory acuity Lungs: Clear throughout to auscultation Heart: Regular rate and rhythm Abdomen: Soft. Active bowel sounds . Bilateral bulging flanks, right greater than left . Localized, but marked tenderness of right lateral lumbar area with light palpation.  No masses, no hepatomegaly. Normal bowel sounds Musculoskeletal: Symmetrical with no gross deformities  Extremities: No edema. No pain with straight leg lift Neurological: Alert oriented x 4, grossly nonfocal Psychological:  Alert and cooperative. Normal mood and affect  Assessment and Recommendations:  72 year old male with acute severe right lateral lumbar pain associated with movement, deep breaths. I do not see any skin lesions to suggest shingles but patient could be in the prodromal stage. Pain felt to be musculoskeletal or neuropathic in nature. Recent labs and CT scan of the abdomen and pelvis with contrast were nondiagnostic.  I have ordered an MRI of the lumbar spine for further evaluation. Will call patient with results and further  recommendations

## 2013-02-27 NOTE — Progress Notes (Signed)
Pyrtle was doc of day

## 2013-02-27 NOTE — Progress Notes (Signed)
Sorry.[name] Kitcheni agree,I saw patient

## 2013-03-02 ENCOUNTER — Telehealth: Payer: Self-pay | Admitting: Nurse Practitioner

## 2013-03-02 NOTE — Telephone Encounter (Signed)
Patient states he cannot afford the Copay for the MRI. Wants to cancel it. Cancelled with radiology.

## 2013-03-03 ENCOUNTER — Other Ambulatory Visit: Payer: Self-pay | Admitting: Family Medicine

## 2013-03-03 ENCOUNTER — Ambulatory Visit (HOSPITAL_COMMUNITY): Admission: RE | Admit: 2013-03-03 | Payer: Medicare Other | Source: Ambulatory Visit

## 2013-03-25 ENCOUNTER — Telehealth: Payer: Self-pay | Admitting: Nurse Practitioner

## 2013-03-25 NOTE — Telephone Encounter (Signed)
error 

## 2013-03-31 ENCOUNTER — Telehealth: Payer: Self-pay | Admitting: *Deleted

## 2013-03-31 NOTE — Telephone Encounter (Signed)
Message copied by Daphine Deutscher on Tue Mar 31, 2013  2:28 PM ------      Message from: Meredith Pel      Created: Tue Mar 31, 2013  1:36 PM       Rene Kocher, please see if this patient's pain got better. Also, he was sent a colon recall letter last year. Is he planning on having one done?      Thanks      ----- Message -----         From: Mardella Layman, MD         Sent: 02/27/2013   8:23 AM           To: Meredith Pel, NP                        ----- Message -----         From: Meredith Pel, NP         Sent: 02/26/2013   5:23 PM           To: Mardella Layman, MD                   ------

## 2013-03-31 NOTE — Telephone Encounter (Signed)
Left a message for patient to call me. 

## 2013-04-01 NOTE — Telephone Encounter (Signed)
Left a message for patient to call me. 

## 2013-04-02 NOTE — Telephone Encounter (Signed)
Left patient a message to call me. 

## 2013-04-03 NOTE — Telephone Encounter (Signed)
Left a message with family for patient to call me with update on how he is doing.

## 2013-04-06 NOTE — Telephone Encounter (Signed)
Have tried unsuccessfully to reach patient by phone to get an update on how he is doing and to see if he will schedule a colonoscopy.

## 2013-04-22 ENCOUNTER — Telehealth: Payer: Self-pay | Admitting: Family Medicine

## 2013-04-22 ENCOUNTER — Ambulatory Visit (INDEPENDENT_AMBULATORY_CARE_PROVIDER_SITE_OTHER): Payer: Medicare Other | Admitting: Internal Medicine

## 2013-04-22 ENCOUNTER — Telehealth: Payer: Self-pay

## 2013-04-22 ENCOUNTER — Encounter: Payer: Self-pay | Admitting: Internal Medicine

## 2013-04-22 VITALS — BP 112/60 | HR 75 | Temp 98.5°F | Wt 220.0 lb

## 2013-04-22 DIAGNOSIS — Z9189 Other specified personal risk factors, not elsewhere classified: Secondary | ICD-10-CM

## 2013-04-22 DIAGNOSIS — J329 Chronic sinusitis, unspecified: Secondary | ICD-10-CM

## 2013-04-22 DIAGNOSIS — Z7722 Contact with and (suspected) exposure to environmental tobacco smoke (acute) (chronic): Secondary | ICD-10-CM

## 2013-04-22 MED ORDER — OXYCODONE-ACETAMINOPHEN 10-325 MG PO TABS: 1.0000 | ORAL_TABLET | Freq: Four times a day (QID) | ORAL | Status: DC | PRN

## 2013-04-22 MED ORDER — AMOXICILLIN-POT CLAVULANATE 875-125 MG PO TABS: 1.0000 | ORAL_TABLET | Freq: Two times a day (BID) | ORAL | Status: DC

## 2013-04-22 NOTE — Telephone Encounter (Signed)
Opened in error

## 2013-04-22 NOTE — Telephone Encounter (Signed)
done

## 2013-04-22 NOTE — Addendum Note (Signed)
Addended by: Gershon Crane A on: 04/22/2013 12:20 PM   Modules accepted: Orders

## 2013-04-22 NOTE — Progress Notes (Signed)
Chief Complaint  Patient presents with  . Cough    Ongoing for 8 days.  No fever.  Has green and yellow nasal mucus.  . Sinusitis    HPI: Patient comes in today for SDA for  new problem evaluation. Onset about 8 days of uri sx and chest cough feeling but copious thick sinus drainage   And face pressure and pain  Taking otc s without help  Get sinus infection about 2 xx per year . No fever POs ets from wife  Ext tobacco no asthma.  Some sore thrat  Exposed to dust leaves and mold working  Outside recnetly also  ROS: See pertinent positives and negatives per HPI.  Past Medical History  Diagnosis Date  . Anemia   . Hyperlipidemia   . Hypertension   . Allergy   . Myocardial infarction   . Ulcer   . GERD (gastroesophageal reflux disease)   . Restless leg syndrome   . Hypothyroidism   . Headache(784.0)   . Obstructive sleep apnea     pt refused  . CAD (coronary artery disease)   . Gastritis   . Bronchitis   . Back pain     low  . Diverticulosis   . Adenomatous polyp of colon 2007  . AVM (arteriovenous malformation)     Family History  Problem Relation Age of Onset  . Heart disease Father   . Stroke Father   . Leukemia Mother   . Alcoholism Paternal Uncle   . Alcoholism Maternal Grandfather     History   Social History  . Marital Status: Married    Spouse Name: N/A    Number of Children: 1  . Years of Education: N/A   Occupational History  . Disabled    Social History Main Topics  . Smoking status: Former Smoker -- 2.00 packs/day for 50 years    Types: Cigarettes    Quit date: 07/24/2007  . Smokeless tobacco: Never Used  . Alcohol Use: No  . Drug Use: No  . Sexual Activity: None   Other Topics Concern  . None   Social History Narrative  . None    Outpatient Encounter Prescriptions as of 04/22/2013  Medication Sig Dispense Refill  . amLODipine (NORVASC) 5 MG tablet Take 5 mg by mouth daily.      . Ascorbic Acid (VITAMIN C) 1000 MG tablet Take  1,000 mg by mouth daily.        [name] aspirin 81 MG tablet Take 81 mg by mouth daily.        . clonazePAM (KLONOPIN) 0.5 MG tablet TAKE 1 TABLET BY MOUTH 3 TIMES A DAY AS NEEDED  270 tablet  1  . cyclobenzaprine (FLEXERIL) 10 MG tablet Take 10 mg by mouth 3 (three) times daily as needed. For muscle spasms      . diclofenac sodium (VOLTAREN) 1 % GEL Apply 1 application topically 4 (four) times daily.  5 Tube  10  . fluticasone (FLONASE) 50 MCG/ACT nasal spray Place 2 sprays into the nose daily.      [name] HYDROcodone-acetaminophen (NORCO/VICODIN) 5-325 MG per tablet TAKE 1 TABLET BY MOUTH EVERY 6 HOURS AS NEEDED FOR PAIN  120 tablet  5  . levothyroxine (SYNTHROID, LEVOTHROID) 150 MCG tablet Take 150 mcg by mouth daily.      [name] levothyroxine (SYNTHROID, LEVOTHROID) 150 MCG tablet TAKE 1 TABLET BY MOUTH EVERY DAY  30 tablet  1  . nitroGLYCERIN (NITROSTAT) 0.4 MG SL tablet Place  1 tablet (0.4 mg total) under the tongue every 5 (five) minutes as needed for chest pain. For chest pain  25 tablet  6  . omeprazole (PRILOSEC) 20 MG capsule TAKE ONE CAPSULE BY MOUTH TWICE A DAY  180 capsule  3  . rOPINIRole (REQUIP) 2 MG tablet TAKE 2 TABLETS BY MOUTH AT BEDTIME  60 tablet  11  . [DISCONTINUED] oxyCODONE-acetaminophen (PERCOCET) 10-325 MG per tablet Take 1 tablet by mouth every 6 (six) hours as needed for pain.  120 tablet  0  . amoxicillin-clavulanate (AUGMENTIN) 875-125 MG per tablet Take 1 tablet by mouth every 12 (twelve) hours.  20 tablet  0  . Calcium-Magnesium-Vitamin D (CALCIUM MAGNESIUM PO) Take 1 tablet by mouth daily.        . fish oil-omega-3 fatty acids 1000 MG capsule Take 2 g by mouth daily.         No facility-administered encounter medications on file as of 04/22/2013.    EXAM:  BP 112/60  Pulse 75  Temp(Src) 98.5 F (36.9 C) (Oral)  Wt 220 lb (99.791 kg)  BMI 32.25 kg/m2  SpO2 94%  Body mass index is 32.25 kg/(m^2).  GENERAL: vitals reviewed and listed above, alert, oriented, appears  well hydrated and in no acute distress congested   Non toxic  HEENT: atraumatic, conjunctiva  clear, no obvious abnormalities on inspection of external nose and ears  Congestion  Face plus mines tender OP : no lesion edema or exudate   NECK: no obvious masses on inspection palpation  No adenopathy  LUNGS: clear to auscultation bilaterally, no wheezes, rales or rhonchi, dec bs generally  CV: HRRR, no clubbing cyanosis or  peripheral edema nl cap refill  MS: moves all extremities without noticeable focal  abnormality PSYCH: pleasant and cooperative, no obvious depression or anxiety Co bavck pain  Asking for refill oxycodone from dr Clent Ridges .  ASSESSMENT AND PLAN:  Discussed the following assessment and plan:  Sinusitis - hx of same poss some envronmental exposure  pos excess ETS   Contact with and suspected exposure to environmental tobacco smoke Discussed attempts to decrease ETS exposure as a health risk -Patient advised to return or notify health care team  if symptoms worsen or persist or new concerns arise.  Patient Instructions  Sinusitis Sinusitis is redness, soreness, and swelling (inflammation) of the paranasal sinuses. Paranasal sinuses are air pockets within the bones of your face (beneath the eyes, the middle of the forehead, or above the eyes). In healthy paranasal sinuses, mucus is able to drain out, and air is able to circulate through them by way of your nose. However, when your paranasal sinuses are inflamed, mucus and air can become trapped. This can allow bacteria and other germs to grow and cause infection. Sinusitis can develop quickly and last only a short time (acute) or continue over a long period (chronic). Sinusitis that lasts for more than 12 weeks is considered chronic.  CAUSES  Causes of sinusitis include:  Allergies.  Structural abnormalities, such as displacement of the cartilage that separates your nostrils (deviated septum), which can decrease the air flow  through your nose and sinuses and affect sinus drainage.  Functional abnormalities, such as when the small hairs (cilia) that line your sinuses and help remove mucus do not work properly or are not present. SYMPTOMS  Symptoms of acute and chronic sinusitis are the same. The primary symptoms are pain and pressure around the affected sinuses. Other symptoms include:  Upper toothache.  Earache.  Headache.  Bad breath.  Decreased sense of smell and taste.  A cough, which worsens when you are lying flat.  Fatigue.  Fever.  Thick drainage from your nose, which often is green and may contain pus (purulent).  Swelling and warmth over the affected sinuses. DIAGNOSIS  Your caregiver will perform a physical exam. During the exam, your caregiver may:  Look in your nose for signs of abnormal growths in your nostrils (nasal polyps).  Tap over the affected sinus to check for signs of infection.  View the inside of your sinuses (endoscopy) with a special imaging device with a light attached (endoscope), which is inserted into your sinuses. If your caregiver suspects that you have chronic sinusitis, one or more of the following tests may be recommended:  Allergy tests.  Nasal culture A sample of mucus is taken from your nose and sent to a lab and screened for bacteria.  Nasal cytology A sample of mucus is taken from your nose and examined by your caregiver to determine if your sinusitis is related to an allergy. TREATMENT  Most cases of acute sinusitis are related to a viral infection and will resolve on their own within 10 days. Sometimes medicines are prescribed to help relieve symptoms (pain medicine, decongestants, nasal steroid sprays, or saline sprays).  However, for sinusitis related to a bacterial infection, your caregiver will prescribe antibiotic medicines. These are medicines that will help kill the bacteria causing the infection.  Rarely, sinusitis is caused by a fungal  infection. In theses cases, your caregiver will prescribe antifungal medicine. For some cases of chronic sinusitis, surgery is needed. Generally, these are cases in which sinusitis recurs more than 3 times per year, despite other treatments. HOME CARE INSTRUCTIONS   Drink plenty of water. Water helps thin the mucus so your sinuses can drain more easily.  Use a humidifier.  Inhale steam 3 to 4 times a day (for example, sit in the bathroom with the shower running).  Apply a warm, moist washcloth to your face 3 to 4 times a day, or as directed by your caregiver.  Use saline nasal sprays to help moisten and clean your sinuses.  Take over-the-counter or prescription medicines for pain, discomfort, or fever only as directed by your caregiver. SEEK IMMEDIATE MEDICAL CARE IF:  You have increasing pain or severe headaches.  You have nausea, vomiting, or drowsiness.  You have swelling around your face.  You have vision problems.  You have a stiff neck.  You have difficulty breathing. MAKE SURE YOU:   Understand these instructions.  Will watch your condition.  Will get help right away if you are not doing well or get worse. Document Released: 07/09/2005 Document Revised: 10/01/2011 Document Reviewed: 07/24/2011 [facility] Patient Information 2014 [place], [place].      Neta Mends. Panosh M.D.

## 2013-04-22 NOTE — Telephone Encounter (Signed)
Patient was seen in the office today by [facility].  He would like a refill of his oxyCODONE-acetaminophen (PERCOCET) 10-325 MG per tablet.  Please advise.  Thanks!

## 2013-04-22 NOTE — Patient Instructions (Signed)

## 2013-05-03 ENCOUNTER — Other Ambulatory Visit: Payer: Self-pay | Admitting: Family Medicine

## 2013-05-12 ENCOUNTER — Telehealth: Payer: Self-pay | Admitting: Family Medicine

## 2013-05-12 MED ORDER — HYDROCODONE-ACETAMINOPHEN 5-325 MG PO TABS: ORAL_TABLET | ORAL | Status: DC

## 2013-05-12 NOTE — Telephone Encounter (Signed)
Pt request refill HYDROcodone-acetaminophen (NORCO/VICODIN) 5-325 MG per.  °

## 2013-05-12 NOTE — Telephone Encounter (Signed)
done

## 2013-05-13 NOTE — Telephone Encounter (Signed)
Pt aware rx ready

## 2013-05-13 NOTE — Telephone Encounter (Signed)
Script is ready for pick up, tried to reach pt and no answer.  

## 2013-05-20 ENCOUNTER — Ambulatory Visit (HOSPITAL_COMMUNITY): Payer: Medicare Other | Attending: Family Medicine

## 2013-05-20 ENCOUNTER — Encounter: Payer: Self-pay | Admitting: Family Medicine

## 2013-05-20 ENCOUNTER — Ambulatory Visit (INDEPENDENT_AMBULATORY_CARE_PROVIDER_SITE_OTHER): Payer: Medicare Other | Admitting: Family Medicine

## 2013-05-20 VITALS — BP 136/80 | HR 60 | Temp 98.3°F | Wt 218.0 lb

## 2013-05-20 DIAGNOSIS — E785 Hyperlipidemia, unspecified: Secondary | ICD-10-CM | POA: Insufficient documentation

## 2013-05-20 DIAGNOSIS — R229 Localized swelling, mass and lump, unspecified: Secondary | ICD-10-CM

## 2013-05-20 DIAGNOSIS — I10 Essential (primary) hypertension: Secondary | ICD-10-CM | POA: Insufficient documentation

## 2013-05-20 DIAGNOSIS — Z87891 Personal history of nicotine dependence: Secondary | ICD-10-CM | POA: Insufficient documentation

## 2013-05-20 DIAGNOSIS — M7989 Other specified soft tissue disorders: Secondary | ICD-10-CM

## 2013-05-20 DIAGNOSIS — M79609 Pain in unspecified limb: Secondary | ICD-10-CM | POA: Insufficient documentation

## 2013-05-20 DIAGNOSIS — I2510 Atherosclerotic heart disease of native coronary artery without angina pectoris: Secondary | ICD-10-CM | POA: Insufficient documentation

## 2013-05-20 DIAGNOSIS — E669 Obesity, unspecified: Secondary | ICD-10-CM | POA: Insufficient documentation

## 2013-05-20 NOTE — Progress Notes (Signed)
  Subjective:    Patient ID: [name], male    DOB: May 20, 1941, 72 y.o.   MRN: [id number]  HPI Here for 6 days of swelling and pain in the right lower leg with no recent trauma. It started with a tender knot on the lateral leg and now the entire lower leg is swollen and painful. No SOB or chest pain.    Review of Systems  Constitutional: Negative.   Respiratory: Negative.   Cardiovascular: Positive for leg swelling. Negative for chest pain and palpitations.       Objective:   Physical Exam  Constitutional: He appears well-developed and well-nourished.  Cardiovascular: Normal rate, regular rhythm, normal heart sounds and intact distal pulses.   Pulmonary/Chest: Effort normal and breath sounds normal.  Musculoskeletal:  The right lower leg is swollen and he has a very tender firm nodule on the lateral leg. No erythema or warmth. [name] is positive.           Assessment & Plan:  Probable phlebitis with a superficial thrombus. Get a venous doppler today

## 2013-05-22 MED ORDER — DICLOFENAC SODIUM 75 MG PO TBEC: 75.0000 mg | DELAYED_RELEASE_TABLET | Freq: Two times a day (BID) | ORAL | Status: DC

## 2013-05-22 NOTE — Progress Notes (Signed)
Quick Note:  I spoke with pt and sent script e-scribe to [facility]. ______ 

## 2013-05-22 NOTE — Progress Notes (Signed)
Quick Note:  I spoke with pt and leg is still swollen and painful. ______

## 2013-07-02 ENCOUNTER — Telehealth: Payer: Self-pay | Admitting: Family Medicine

## 2013-07-02 NOTE — Telephone Encounter (Signed)
Pt would like rx for oxyCODONE-acetaminophen (PERCOCET) 10-325 MG per tablet

## 2013-07-03 MED ORDER — OXYCODONE-ACETAMINOPHEN 10-325 MG PO TABS: 1.0000 | ORAL_TABLET | Freq: Four times a day (QID) | ORAL | Status: DC | PRN

## 2013-07-03 NOTE — Telephone Encounter (Signed)
Done but he needs a contract and testing

## 2013-07-03 NOTE — Telephone Encounter (Signed)
RX ready for pick up 

## 2013-07-12 ENCOUNTER — Other Ambulatory Visit: Payer: Self-pay | Admitting: Family Medicine

## 2013-07-13 NOTE — Telephone Encounter (Signed)
Call in #[phone] with one rf 

## 2013-08-12 ENCOUNTER — Ambulatory Visit (INDEPENDENT_AMBULATORY_CARE_PROVIDER_SITE_OTHER): Payer: Medicare HMO | Admitting: Family Medicine

## 2013-08-12 ENCOUNTER — Encounter: Payer: Self-pay | Admitting: Family Medicine

## 2013-08-12 ENCOUNTER — Other Ambulatory Visit: Payer: Self-pay | Admitting: Family Medicine

## 2013-08-12 VITALS — BP 138/72 | HR 70 | Temp 97.8°F | Ht 69.25 in | Wt 222.0 lb

## 2013-08-12 DIAGNOSIS — M79609 Pain in unspecified limb: Secondary | ICD-10-CM

## 2013-08-12 DIAGNOSIS — Z8669 Personal history of other diseases of the nervous system and sense organs: Secondary | ICD-10-CM

## 2013-08-12 DIAGNOSIS — J209 Acute bronchitis, unspecified: Secondary | ICD-10-CM

## 2013-08-12 DIAGNOSIS — M79606 Pain in leg, unspecified: Secondary | ICD-10-CM

## 2013-08-12 MED ORDER — HYDROCODONE-ACETAMINOPHEN 5-325 MG PO TABS: ORAL_TABLET | ORAL | Status: DC

## 2013-08-12 MED ORDER — ROPINIROLE HCL 5 MG PO TABS: 5.0000 mg | ORAL_TABLET | Freq: Every day | ORAL | Status: DC

## 2013-08-12 MED ORDER — AMOXICILLIN 875 MG PO TABS: 875.0000 mg | ORAL_TABLET | Freq: Two times a day (BID) | ORAL | Status: DC

## 2013-08-12 NOTE — Progress Notes (Signed)
Pre visit review using our clinic review tool, if applicable. No additional management support is needed unless otherwise documented below in the visit note. 

## 2013-08-13 ENCOUNTER — Encounter: Payer: Self-pay | Admitting: Family Medicine

## 2013-08-13 NOTE — Progress Notes (Signed)
   Subjective:    Patient ID: [name], male    DOB: [birth year], 73 y.o.   MRN: [id number]  HPI Here for one week of chest tightness and coughing up green sputum. No fever. Also his Requip is not working as well as it used to. He is now getting leg jerks and trouble sleeping again.    Review of Systems  Constitutional: Negative.   HENT: Positive for congestion. Negative for postnasal drip and sinus pressure.   Eyes: Negative.   Respiratory: Positive for cough.        Objective:   Physical Exam  Constitutional: He appears well-developed and well-nourished.  HENT:  Right Ear: External ear normal.  Left Ear: External ear normal.  Nose: Nose normal.  Mouth/Throat: Oropharynx is clear and moist.  Eyes: Conjunctivae are normal.  Pulmonary/Chest: Effort normal and breath sounds normal.  Lymphadenopathy:    He has no cervical adenopathy.          Assessment & Plan:  Given Amoxicillin for th bronchitis. Increase Requip to 5 mg qhs.

## 2013-08-17 ENCOUNTER — Telehealth: Payer: Self-pay | Admitting: Family Medicine

## 2013-08-17 NOTE — Telephone Encounter (Signed)
Pts insurance has changed and now needs all medications to go to [facility] order. Please advise.

## 2013-08-18 ENCOUNTER — Other Ambulatory Visit: Payer: Self-pay | Admitting: Family Medicine

## 2013-08-18 MED ORDER — ROPINIROLE HCL 5 MG PO TABS: 5.0000 mg | ORAL_TABLET | Freq: Every day | ORAL | Status: DC

## 2013-08-18 MED ORDER — LEVOTHYROXINE SODIUM 150 MCG PO TABS: ORAL_TABLET | ORAL | Status: DC

## 2013-08-18 MED ORDER — CYCLOBENZAPRINE HCL 10 MG PO TABS: 10.0000 mg | ORAL_TABLET | Freq: Three times a day (TID) | ORAL | Status: DC | PRN

## 2013-08-18 MED ORDER — AMLODIPINE BESYLATE 5 MG PO TABS: 5.0000 mg | ORAL_TABLET | Freq: Every day | ORAL | Status: DC

## 2013-08-18 MED ORDER — OMEPRAZOLE 20 MG PO CPDR: DELAYED_RELEASE_CAPSULE | ORAL | Status: DC

## 2013-08-18 NOTE — Telephone Encounter (Signed)
I sent in scripts e-scribe for Amlodipine, Flexeril, Synthroid, Prilosec, & Requip to [facility] and spoke with pt.

## 2013-08-19 NOTE — Telephone Encounter (Signed)
Go with one tablet every day. Call in #[phone] with 11 rf

## 2013-08-19 NOTE — Telephone Encounter (Signed)
This medication was listed in chart with 3 different directions, can you clarify?

## 2013-08-20 ENCOUNTER — Encounter: Payer: Self-pay | Admitting: Family Medicine

## 2013-08-23 ENCOUNTER — Other Ambulatory Visit: Payer: Self-pay | Admitting: Family Medicine

## 2013-08-25 NOTE — Telephone Encounter (Signed)
Can we refill this? 

## 2013-09-07 ENCOUNTER — Other Ambulatory Visit: Payer: Self-pay | Admitting: Family Medicine

## 2013-09-21 ENCOUNTER — Telehealth: Payer: Self-pay | Admitting: Family Medicine

## 2013-09-21 NOTE — Telephone Encounter (Signed)
HYDROcodone-acetaminophen (NORCO/VICODIN) 5-325 MG per tablet and oxyCODONE-acetaminophen (PERCOCET) 10-325 MG per tablet re-fill needed.

## 2013-09-22 ENCOUNTER — Ambulatory Visit (INDEPENDENT_AMBULATORY_CARE_PROVIDER_SITE_OTHER): Payer: Medicare HMO | Admitting: Family Medicine

## 2013-09-22 ENCOUNTER — Encounter: Payer: Self-pay | Admitting: Family Medicine

## 2013-09-22 VITALS — BP 136/80 | HR 78 | Temp 97.9°F | Ht 69.25 in | Wt 225.0 lb

## 2013-09-22 DIAGNOSIS — S0093XA Contusion of unspecified part of head, initial encounter: Secondary | ICD-10-CM

## 2013-09-22 DIAGNOSIS — S1093XA Contusion of unspecified part of neck, initial encounter: Secondary | ICD-10-CM

## 2013-09-22 DIAGNOSIS — S0003XA Contusion of scalp, initial encounter: Secondary | ICD-10-CM

## 2013-09-22 DIAGNOSIS — S0083XA Contusion of other part of head, initial encounter: Secondary | ICD-10-CM

## 2013-09-22 DIAGNOSIS — G4733 Obstructive sleep apnea (adult) (pediatric): Secondary | ICD-10-CM

## 2013-09-22 MED ORDER — OXYCODONE-ACETAMINOPHEN 10-325 MG PO TABS: 1.0000 | ORAL_TABLET | Freq: Four times a day (QID) | ORAL | Status: DC | PRN

## 2013-09-22 MED ORDER — FLUTICASONE PROPIONATE 50 MCG/ACT NA SUSP: 2.0000 | Freq: Every day | NASAL | Status: DC

## 2013-09-22 MED ORDER — HYDROCODONE-ACETAMINOPHEN 5-325 MG PO TABS: ORAL_TABLET | ORAL | Status: DC

## 2013-09-22 NOTE — Progress Notes (Signed)
Pre visit review using our clinic review tool, if applicable. No additional management support is needed unless otherwise documented below in the visit note. 

## 2013-09-22 NOTE — Telephone Encounter (Signed)
Seen today. 

## 2013-09-23 ENCOUNTER — Encounter: Payer: Self-pay | Admitting: Family Medicine

## 2013-09-23 NOTE — Progress Notes (Signed)
   Subjective:    Patient ID: [name], male    DOB: [birth year], 73 y.o.   MRN: [id number]  HPI Here for several things. First on 09-18-13 while at home working in his shop, he was sitting on a rolling stool when he fell asleep. He actually fell off the stool and struck the back of his head on a metal table. There was no LOC but his head is sore and he had a large goose egg which has been slowing shrinking. Other than local soreness he has no other sx. He speaks about his tendency to fall asleep all the time when he sits still, and he has trouble reading the paper or watching TV without fighting sleep. He has even fallen asleep while sitting in his truck at a stoplight. He has known sleep apnea, and he underwent a full evaluation several years ago. He was fitted with a CPAP but did not tolerate the mask, so he did not return to the sleep clinic.    Review of Systems  Constitutional: Negative.   Neurological: Negative.        Objective:   Physical Exam  Constitutional: He is oriented to person, place, and time. He appears well-developed and well-nourished.  Cardiovascular: Normal rate, regular rhythm, normal heart sounds and intact distal pulses.   Pulmonary/Chest: Effort normal and breath sounds normal.  Musculoskeletal:  Small tender hematoma on the back of the head  Neurological: He is alert and oriented to person, place, and time. He has normal reflexes. No cranial nerve deficit. He exhibits normal muscle tone. Coordination normal.          Assessment & Plan:  The scalp hematoma will soon resolve. He falls asleep all the time due to sleep apnea and I strongly urged him to get this treated. We will send him back to Dr. Gwenette Greet to consider other treatment options.

## 2013-10-08 ENCOUNTER — Other Ambulatory Visit: Payer: Self-pay | Admitting: Family Medicine

## 2013-10-16 ENCOUNTER — Encounter: Payer: Self-pay | Admitting: Pulmonary Disease

## 2013-10-16 ENCOUNTER — Ambulatory Visit (INDEPENDENT_AMBULATORY_CARE_PROVIDER_SITE_OTHER): Payer: Medicare HMO | Admitting: Pulmonary Disease

## 2013-10-16 VITALS — BP 132/82 | HR 68 | Temp 97.4°F | Ht 70.0 in | Wt 227.0 lb

## 2013-10-16 DIAGNOSIS — G4733 Obstructive sleep apnea (adult) (pediatric): Secondary | ICD-10-CM

## 2013-10-16 NOTE — Patient Instructions (Signed)
Will start on cpap as a trial, and hopefully this will improve your daytime sleepiness. Try to minimize sedating meds as much as possible followup with me in 8 weeks, and bring your machine with you to the visit.

## 2013-10-16 NOTE — Assessment & Plan Note (Signed)
The patient has a history of mild obstructive sleep apnea in the past, but also has a chronic pain syndrome that disrupts his sleep and also requires significant pain medication during the day which contributes to his sleepiness. It is really unclear how much of his sleep apnea contributes to his symptoms, and he now has gotten to the point this is significantly impacting his quality of life and perhaps his safety. The only way to know at this point is to give him a trial of CPAP, and see how well he responds. The patient is hesitant to even consider CPAP, but I have convinced him to try.

## 2013-10-16 NOTE — Progress Notes (Signed)
Subjective:    Patient ID: [name], male    DOB: Dec 18, 1940, 73 y.o.   MRN: [id number]  HPI The patient is a 73 year old male who I've been asked to see for management of obstructive sleep apnea. He was seen in 2010 with mild OSA, with an AHI of 16 events per hour. He presented with severe hypersomnia, but it was unclear how much of this was secondary to his chronic pain syndrome which disrupts his sleep and pain medication, versus his actual sleep apnea. The patient that time elected to work on weight loss, and did not feel he could tolerate CPAP. He comes in today where his sleepiness has gotten worse, despite losing 9 pounds. He continues to have loud snoring and an abnormal breathing pattern during sleep, but his wife feels it is better since being off a beta blocker. He is not rested in the mornings upon arising, and has severe sleepiness with any period of inactivity during the day. His Epworth score today is 24.   Sleep Questionnaire What time do you typically go to bed?( Between what hours) 12:00-2:00 a.m 12:00-2:00 a.m at 1119 on 10/16/13 by Lu Duffel How long does it take you to fall asleep? 30 mins 30 mins at 1119 on 10/16/13 by Lu Duffel How many times during the night do you wake up? 5 5 at 1119 on 10/16/13 by Lu Duffel What time do you get out of bed to start your day? 0800 0800 at 1119 on 10/16/13 by Lu Duffel Do you drive or operate heavy machinery in your occupation? No No at 1119 on 10/16/13 by Lu Duffel How much has your weight changed (up or down) over the past two years? (In pounds) 25 lb (11.34 kg) 25 lb (11.34 kg) at 1119 on 10/16/13 by Lu Duffel Have you ever had a sleep study before? If yes, location of study? [facility] at 5573 on 10/16/13 by Lu Duffel If yes, date of study? 2012?? 2012?? at 1119 on 10/16/13 by Lu Duffel Do you currently use CPAP? No No at 1119 on  10/16/13 by Lu Duffel Do you wear oxygen at any time? No No at 1119 on 10/16/13 by Lu Duffel   Review of Systems  Constitutional: Negative for fever and unexpected weight change.  HENT: Negative for congestion, dental problem, ear pain, nosebleeds, postnasal drip, rhinorrhea, sinus pressure, sneezing, sore throat and trouble swallowing.   Eyes: Negative for redness and itching.  Respiratory: Positive for shortness of breath. Negative for cough, chest tightness and wheezing.   Cardiovascular: Positive for leg swelling. Negative for palpitations.       Hand and feet  Gastrointestinal: Negative for nausea and vomiting.  Genitourinary: Negative for dysuria.  Musculoskeletal: Negative for joint swelling.  Skin: Negative for rash.  Neurological: Negative for headaches.  Hematological: Does not bruise/bleed easily.  Psychiatric/Behavioral: Negative for dysphoric mood. The patient is not nervous/anxious.        Objective:   Physical Exam Constitutional:  Well developed, no acute distress  HENT:  Nares patent without discharge ,but septal deviation to the left  Oropharynx without exudate, palate and uvula are moderately elongated.   Eyes:  Perrla, eomi, no scleral icterus  Neck:  No JVD, no TMG  Cardiovascular:  Normal rate, ?regular rhythm, no rubs or gallops.  No murmurs        Intact distal pulses  Pulmonary :  Normal breath sounds,  no stridor or respiratory distress   No rales, rhonchi, or wheezing  Abdominal:  Soft, nondistended, bowel sounds present.  No tenderness noted.   Musculoskeletal:  No lower extremity edema noted.  Lymph Nodes:  No cervical lymphadenopathy noted  Skin:  No cyanosis noted  Neurologic:  Alert, appropriate, moves all 4 extremities without obvious deficit.         Assessment & Plan:

## 2013-12-04 ENCOUNTER — Encounter: Payer: Self-pay | Admitting: *Deleted

## 2013-12-11 ENCOUNTER — Ambulatory Visit: Payer: Medicare HMO | Admitting: Pulmonary Disease

## 2013-12-28 ENCOUNTER — Other Ambulatory Visit: Payer: Self-pay | Admitting: Family Medicine

## 2013-12-28 ENCOUNTER — Telehealth: Payer: Self-pay | Admitting: Family Medicine

## 2013-12-28 NOTE — Telephone Encounter (Signed)
Pt is needing new rx for oxyCODONE-acetaminophen (PERCOCET) 10-325 MG per tablet and clonazepam (klonopin) 0.5 mg. Pt states he is out of the clonazepam and need it sent to [facility]. Pt is aware that he will come to office and pick up oxycodone.

## 2013-12-29 MED ORDER — OXYCODONE-ACETAMINOPHEN 10-325 MG PO TABS: 1.0000 | ORAL_TABLET | Freq: Four times a day (QID) | ORAL | Status: DC | PRN

## 2013-12-29 MED ORDER — CLONAZEPAM 0.5 MG PO TABS: ORAL_TABLET | ORAL | Status: DC

## 2013-12-29 NOTE — Telephone Encounter (Signed)
The rx for Percocet is ready. Please call in Clonazepam #[phone] with one rf

## 2013-12-29 NOTE — Telephone Encounter (Signed)
I left a voice message, 1 script is ready for pick up and I called the other 1 in.

## 2014-01-01 ENCOUNTER — Telehealth: Payer: Self-pay | Admitting: Family Medicine

## 2014-01-01 DIAGNOSIS — G4733 Obstructive sleep apnea (adult) (pediatric): Secondary | ICD-10-CM

## 2014-01-01 NOTE — Telephone Encounter (Signed)
Referral was done  

## 2014-01-01 NOTE — Telephone Encounter (Signed)
[facility] is calling needing referral for pt, pt has an appt on 01/06/14. With dr. Gwenette Greet.

## 2014-01-01 NOTE — Telephone Encounter (Signed)
I tried to call Judeen Hammans, no answer.

## 2014-01-06 ENCOUNTER — Encounter: Payer: Self-pay | Admitting: Pulmonary Disease

## 2014-01-06 ENCOUNTER — Encounter (INDEPENDENT_AMBULATORY_CARE_PROVIDER_SITE_OTHER): Payer: Self-pay

## 2014-01-06 ENCOUNTER — Ambulatory Visit (INDEPENDENT_AMBULATORY_CARE_PROVIDER_SITE_OTHER): Payer: Commercial Managed Care - HMO | Admitting: Pulmonary Disease

## 2014-01-06 VITALS — BP 120/80 | HR 89 | Temp 98.2°F | Ht 70.0 in | Wt 223.8 lb

## 2014-01-06 DIAGNOSIS — G4733 Obstructive sleep apnea (adult) (pediatric): Secondary | ICD-10-CM

## 2014-01-06 NOTE — Progress Notes (Signed)
   Subjective:    Patient ID: [name], male    DOB: 04-08-1941, 73 y.o.   MRN: [id number]  HPI Patient comes in today for followup of his obstructive sleep apnea. He is wearing CPAP compliantly, and overall is having no significant issues with his device or mask. He sometimes feels that his pressure is not high enough, but his download shows good control of his events. He does not want to increase the pressure for now. He is wearing the device compliantly, but is only getting 3-4 hours a night of sleep. He feels this is because of his chronic pain. Patient states that he has seen a definite improvement in his sleepiness during the day.   Review of Systems  Constitutional: Negative for fever and unexpected weight change.  HENT: Negative for congestion, dental problem, ear pain, nosebleeds, postnasal drip, rhinorrhea, sinus pressure, sneezing, sore throat and trouble swallowing.   Eyes: Negative for redness and itching.  Respiratory: Negative for cough, chest tightness, shortness of breath and wheezing.   Cardiovascular: Negative for palpitations and leg swelling.  Gastrointestinal: Negative for nausea and vomiting.  Genitourinary: Negative for dysuria.  Musculoskeletal: Negative for joint swelling.  Skin: Negative for rash.  Neurological: Negative for headaches.  Hematological: Does not bruise/bleed easily.  Psychiatric/Behavioral: Negative for dysphoric mood. The patient is not nervous/anxious.        Objective:   Physical Exam Overweight male in no acute distress Nose without purulence or discharge noted No skin breakdown or pressure necrosis from the CPAP mask Neck without lymphadenopathy or thyromegaly Lower extremities with minimal edema, no cyanosis Alert and oriented, does not appear to be sleepy, moves all 4 extremities       Assessment & Plan:

## 2014-01-06 NOTE — Patient Instructions (Signed)
Continue with cpap, and try to increase usage during the night. Let me know if ongoing pressure or mask fit issues. Work on weight reduction followup with me again in 4mos.

## 2014-01-06 NOTE — Assessment & Plan Note (Signed)
The patient is been wearing CPAP on every day since starting, but unfortunately is only getting 3-4 hours a night because of his chronic pain. He has clearly seen improvement in his daytime sleepiness, and no longer gets sleepy while driving. His current settings are controlling his AHI, and he has no significant mask leak. I have asked him to keep up with his mask changes and supplies, and to work aggressively on weight loss.

## 2014-01-20 ENCOUNTER — Telehealth: Payer: Self-pay | Admitting: Family Medicine

## 2014-01-20 NOTE — Telephone Encounter (Signed)
Pt states he needs another RX for his handicap sticker.  He states they are only good for 5 years and Dr. Sarajane Jews did it 5 years ago.

## 2014-01-25 NOTE — Telephone Encounter (Signed)
Form is ready.

## 2014-01-25 NOTE — Telephone Encounter (Signed)
Ready for pick up and left message for pt.

## 2014-02-22 ENCOUNTER — Encounter: Payer: Self-pay | Admitting: Family Medicine

## 2014-02-22 ENCOUNTER — Ambulatory Visit (INDEPENDENT_AMBULATORY_CARE_PROVIDER_SITE_OTHER): Payer: Commercial Managed Care - HMO | Admitting: Family Medicine

## 2014-02-22 VITALS — BP 129/70 | HR 70 | Temp 98.3°F | Ht 70.0 in | Wt 224.0 lb

## 2014-02-22 DIAGNOSIS — R1032 Left lower quadrant pain: Secondary | ICD-10-CM

## 2014-02-22 DIAGNOSIS — R109 Unspecified abdominal pain: Secondary | ICD-10-CM

## 2014-02-22 MED ORDER — OXYCODONE-ACETAMINOPHEN 10-325 MG PO TABS: 1.0000 | ORAL_TABLET | Freq: Four times a day (QID) | ORAL | Status: DC | PRN

## 2014-02-22 NOTE — Progress Notes (Signed)
   Subjective:    Patient ID: [name], male    DOB: Dec 30, 1940, 73 y.o.   MRN: [id number]  HPI Here for one week of pain in the left groin area. No bowel or urinary issues. No fever. He has no testicular pain or DC.    Review of Systems  Constitutional: Negative.   Gastrointestinal: Positive for abdominal pain. Negative for nausea, vomiting, diarrhea, constipation, blood in stool, abdominal distention, anal bleeding and rectal pain.  Genitourinary: Negative.        Objective:   Physical Exam  Constitutional: He appears well-developed and well-nourished.  Abdominal: Soft. Bowel sounds are normal. He exhibits no distension and no mass. There is no rebound and no guarding.  Tender in the left groin area, no discrete masses a re felt   Genitourinary:  He has an epididymal cyst superior to the left testicle which is not tender           Assessment & Plan:  Probable inguinal hernia. We will refer him to Surgery

## 2014-02-22 NOTE — Progress Notes (Signed)
Pre visit review using our clinic review tool, if applicable. No additional management support is needed unless otherwise documented below in the visit note. 

## 2014-02-24 ENCOUNTER — Telehealth: Payer: Self-pay | Admitting: Family Medicine

## 2014-02-24 MED ORDER — HYDROCODONE-ACETAMINOPHEN 5-325 MG PO TABS: ORAL_TABLET | ORAL | Status: DC

## 2014-02-24 NOTE — Telephone Encounter (Signed)
Pt is needing new rx for HYDROcodone-acetaminophen (NORCO/VICODIN) 5-325 MG per tablet Also pt states DMV has new forms for disability parking placard, new form placed in dr. Barbie Banner box, please call pt when available for pick up

## 2014-02-24 NOTE — Telephone Encounter (Signed)
meds and DMV form are ready

## 2014-02-25 NOTE — Telephone Encounter (Signed)
Script & form is ready for pick up, left a message for pt.

## 2014-03-08 ENCOUNTER — Other Ambulatory Visit: Payer: Self-pay | Admitting: Family Medicine

## 2014-03-11 ENCOUNTER — Encounter (INDEPENDENT_AMBULATORY_CARE_PROVIDER_SITE_OTHER): Payer: Self-pay | Admitting: General Surgery

## 2014-03-11 ENCOUNTER — Ambulatory Visit (INDEPENDENT_AMBULATORY_CARE_PROVIDER_SITE_OTHER): Payer: Commercial Managed Care - HMO | Admitting: General Surgery

## 2014-03-11 VITALS — BP 130/76 | HR 81 | Ht 70.0 in | Wt 223.0 lb

## 2014-03-11 DIAGNOSIS — N508 Other specified disorders of male genital organs: Secondary | ICD-10-CM

## 2014-03-11 DIAGNOSIS — N5089 Other specified disorders of the male genital organs: Secondary | ICD-10-CM

## 2014-03-11 NOTE — Addendum Note (Signed)
Addended by: Ivor Costa on: 03/11/2014 09:50 AM   Modules accepted: Orders

## 2014-03-11 NOTE — Progress Notes (Signed)
Patient ID: [name], male   DOB: 04-01-1941, 73 y.o.   MRN: [id number]  Chief Complaint  Patient presents with  . EVAL HERNIA    HPI [name] is a 73 y.o. male.  The patient is a 73 year old male who is referred by Dr. Sarajane Jews for evaluation of a left inguinal hernia. Patient states he's had a left testicular "knot" for several years. He had some pain to his left inguinal area. He said this has been on and off as left ankle area.  The patient states he's had a vasectomy in the past approximately 45 years ago.  Of note the patient is concerned secondary to the fact that his nephew had groin pain and was diagnosed with colon cancer. Patient states his last colonoscopy was 3-4 years ago. He states he was to return a year later for repeat colonoscopy secondary to multiple polyps on initial colonoscopy. The patient states he has not returned for colonoscopy.  HPI  Past Medical History  Diagnosis Date  . Anemia   . Hyperlipidemia   . Hypertension   . Allergy   . Myocardial infarction   . Ulcer   . GERD (gastroesophageal reflux disease)   . Restless leg syndrome   . Hypothyroidism   . Headache(784.0)   . Obstructive sleep apnea     pt refused  . CAD (coronary artery disease)   . Gastritis   . Bronchitis   . Back pain     low  . Diverticulosis   . Adenomatous polyp of colon 2007  . AVM (arteriovenous malformation)     Past Surgical History  Procedure Laterality Date  . Appendectomy    . Hernia repair    . Tonsillectomy    . Ankle surgery Left   . Colonoscopy  08-23-05    per Dr. Deatra Ina, adenomatous polyps, repeat in 5 yrs   . Esophagogastroduodenoscopy  08-23-05    per Dr. Deatra Ina, cauterized jejunal AVMs   . Skin graft Right     leg    Family History  Problem Relation Age of Onset  . Heart disease Father   . Stroke Father   . Leukemia Mother   . Alcoholism Paternal Uncle   . Alcoholism Maternal Grandfather     Social History History  Substance Use Topics   . Smoking status: Former Smoker -- 2.00 packs/day for 50 years    Types: Cigarettes    Quit date: 07/23/2002  . Smokeless tobacco: Never Used  . Alcohol Use: No    Allergies  Allergen Reactions  . Lipitor [Atorvastatin Calcium]     myalgias  . Metoprolol Tartrate   . Shellfish Allergy   . Simvastatin     myalgias  . Trazodone And Nefazodone     Unsteady on feet  . Zolpidem     Chest pain  . Amoxicillin-Pot Clavulanate Rash    Current Outpatient Prescriptions  Medication Sig Dispense Refill  . amLODipine (NORVASC) 5 MG tablet TAKE 1 TABLET DAILY  90 tablet  3  . Ascorbic Acid (VITAMIN C) 1000 MG tablet Take 1,000 mg by mouth daily.        [name] aspirin 81 MG tablet Take 81 mg by mouth daily.        . Calcium-Magnesium-Vitamin D (CALCIUM MAGNESIUM PO) Take 1 tablet by mouth daily.        . clonazePAM (KLONOPIN) 0.5 MG tablet TAKE 1 TABLET BY MOUTH 3 TIMES A DAY AS NEEDED  270 tablet  1  . cyclobenzaprine (FLEXERIL) 10 MG tablet Take 10 mg by mouth at bedtime. For muscle spasms      . diclofenac sodium (VOLTAREN) 1 % GEL Apply 1 application topically 4 (four) times daily.  5 Tube  10  . fish oil-omega-3 fatty acids 1000 MG capsule Take 2 g by mouth daily.        . fluticasone (FLONASE) 50 MCG/ACT nasal spray Place 2 sprays into both nostrils daily.  48 g  3  . furosemide (LASIX) 40 MG tablet TAKE 1 TABLET BY MOUTH EVERY DAY  30 tablet  11  . HYDROcodone-acetaminophen (NORCO/VICODIN) 5-325 MG per tablet TAKE 1 TABLET BY MOUTH EVERY 6 HOURS AS NEEDED FOR PAIN  120 tablet  0  . levothyroxine (SYNTHROID, LEVOTHROID) 150 MCG tablet TAKE 1 TABLET DAILY  90 tablet  3  . nitroGLYCERIN (NITROSTAT) 0.4 MG SL tablet Place 1 tablet (0.4 mg total) under the tongue every 5 (five) minutes as needed for chest pain. For chest pain  25 tablet  6  . omeprazole (PRILOSEC) 20 MG capsule TAKE 1 CAPSULE TWICE DAILY  180 capsule  3  . oxyCODONE-acetaminophen (PERCOCET) 10-325 MG per tablet Take 1 tablet by  mouth every 6 (six) hours as needed for pain.  120 tablet  0  . ropinirole (REQUIP) 5 MG tablet TAKE 1 TABLET AT BEDTIME  90 tablet  3   No current facility-administered medications for this visit.    Review of Systems Review of Systems  Constitutional: Negative.   HENT: Negative.   Eyes: Negative.   Respiratory: Negative.   Cardiovascular: Negative.   Gastrointestinal: Negative.   Endocrine: Negative.   Neurological: Negative.     Blood pressure 130/76, pulse 81, height 5\' 10"  (1.778 m), weight 223 lb (101.152 kg).  Physical Exam Physical Exam  Constitutional: He is oriented to person, place, and time. He appears well-developed and well-nourished.  HENT:  Head: Normocephalic and atraumatic.  Eyes: Conjunctivae and EOM are normal. Pupils are equal, round, and reactive to light.  Neck: Normal range of motion. Neck supple.  Cardiovascular: Normal rate, regular rhythm and normal heart sounds.   Pulmonary/Chest: Effort normal and breath sounds normal.  Abdominal: Soft. Bowel sounds are normal. He exhibits no distension and no mass. There is no tenderness. There is no rebound and no guarding. Hernia confirmed negative in the right inguinal area and confirmed negative in the left inguinal area.  Genitourinary: Left testis shows mass.  Musculoskeletal: Normal range of motion.  Neurological: He is alert and oriented to person, place, and time.  Skin: Skin is warm and dry.    Data Reviewed none  Assessment    73 year old male with a left testicular mass, no left inguinal hernia on exam.    Plan    1. We'll have the patient referred to [facility] for further evaluation. 2. The patient follow up as needed        Rosario Jacks., Anne Hahn 03/11/2014, 9:30 AM

## 2014-05-17 ENCOUNTER — Telehealth: Payer: Self-pay | Admitting: Pulmonary Disease

## 2014-05-17 NOTE — Telephone Encounter (Signed)
Called spoke with pt. He reports we need to contact [employer] advising them he needs his CPAP machine. He reports we were suppose to do this at last OV. Pt reports he spent over 3.5 hrs with [employer] trying to get this straight. Pt reports [facility] told him as well we needed to call [employer]. I advised pt we will call [employer] tomorrow to see what is needed. He reports [employer]'s # is [phone].

## 2014-05-18 NOTE — Telephone Encounter (Signed)
PT returned call & can be reached at [phone].  [name]

## 2014-05-18 NOTE — Telephone Encounter (Signed)
Spoke with Andee Poles at [employer] they got the CPAP order yesterday and they have started the process with patients insurance to get approval. This can take up to 3-9 business days.   I called patient at home number-patient not home at the time and woman that answered the phone will have the patient call us back so we may discuss this matter with the patient.

## 2014-05-18 NOTE — Telephone Encounter (Signed)
Spoke with pt.  Explained below per Joellen Jersey to him.  He verbalized understanding.

## 2014-05-26 ENCOUNTER — Telehealth: Payer: Self-pay | Admitting: Family Medicine

## 2014-05-26 NOTE — Telephone Encounter (Signed)
DX code:   V67.20NO

## 2014-05-26 NOTE — Telephone Encounter (Signed)
[phone] fax Pt got a piece of metal in his eye. Pt is there now npi [phone]  Dr Cristy Folks No code yet, but will cb [employer] gold plus hmo  She will cb as soon as visit is over w/ code

## 2014-05-26 NOTE — Telephone Encounter (Signed)
Suspended status [id number]

## 2014-06-07 ENCOUNTER — Telehealth: Payer: Self-pay | Admitting: Family Medicine

## 2014-06-07 NOTE — Telephone Encounter (Signed)
Pt  Needs new rx oxycodone

## 2014-06-08 ENCOUNTER — Ambulatory Visit: Payer: Commercial Managed Care - HMO | Admitting: Family Medicine

## 2014-06-08 ENCOUNTER — Encounter: Payer: Self-pay | Admitting: Pulmonary Disease

## 2014-06-08 ENCOUNTER — Ambulatory Visit (INDEPENDENT_AMBULATORY_CARE_PROVIDER_SITE_OTHER): Payer: Commercial Managed Care - HMO | Admitting: Family Medicine

## 2014-06-08 ENCOUNTER — Encounter: Payer: Self-pay | Admitting: Family Medicine

## 2014-06-08 VITALS — BP 137/81 | HR 71 | Temp 98.0°F | Ht 70.0 in | Wt 231.0 lb

## 2014-06-08 DIAGNOSIS — J0100 Acute maxillary sinusitis, unspecified: Secondary | ICD-10-CM

## 2014-06-08 MED ORDER — OXYCODONE-ACETAMINOPHEN 10-325 MG PO TABS: 1.0000 | ORAL_TABLET | Freq: Four times a day (QID) | ORAL | Status: DC | PRN

## 2014-06-08 MED ORDER — AMOXICILLIN 875 MG PO TABS: 875.0000 mg | ORAL_TABLET | Freq: Two times a day (BID) | ORAL | Status: DC

## 2014-06-08 MED ORDER — HYDROCODONE-ACETAMINOPHEN 5-325 MG PO TABS: ORAL_TABLET | ORAL | Status: DC

## 2014-06-08 NOTE — Progress Notes (Signed)
Pre visit review using our clinic review tool, if applicable. No additional management support is needed unless otherwise documented below in the visit note. 

## 2014-06-08 NOTE — Telephone Encounter (Signed)
done

## 2014-06-08 NOTE — Progress Notes (Signed)
   Subjective:    Patient ID: [name], male    DOB: [birth year], 73 y.o.   MRN: [id number]  HPI Here for one week of sinus pressure, HA, blowing green mucus from the nose, and coughing. No fever   Review of Systems  Constitutional: Negative.   HENT: Positive for congestion, postnasal drip and sinus pressure.   Eyes: Negative.   Respiratory: Positive for cough.        Objective:   Physical Exam  Constitutional: He appears well-developed and well-nourished.  HENT:  Right Ear: External ear normal.  Left Ear: External ear normal.  Nose: Nose normal.  Mouth/Throat: Oropharynx is clear and moist.  Eyes: Conjunctivae are normal.  Pulmonary/Chest: Effort normal and breath sounds normal.  Lymphadenopathy:    He has no cervical adenopathy.          Assessment & Plan:  Treat with Amoxicillin.

## 2014-06-08 NOTE — Addendum Note (Signed)
Addended by: [name] A on: 06/08/2014 03:19 PM   Modules accepted: Orders

## 2014-06-29 ENCOUNTER — Telehealth: Payer: Self-pay | Admitting: Family Medicine

## 2014-06-29 NOTE — Telephone Encounter (Signed)
Pt request refill of the following: HYDROcodone-acetaminophen (NORCO) 10-325 MG per tablet   Phamacy: pick up

## 2014-06-30 NOTE — Telephone Encounter (Signed)
I spoke with pt and already has scripts.

## 2014-06-30 NOTE — Telephone Encounter (Signed)
NO he has refills until 09-08-14

## 2014-07-01 ENCOUNTER — Encounter (HOSPITAL_COMMUNITY): Payer: Self-pay | Admitting: Cardiovascular Disease

## 2014-07-07 ENCOUNTER — Telehealth: Payer: Self-pay | Admitting: Family Medicine

## 2014-07-07 MED ORDER — CLONAZEPAM 0.5 MG PO TABS: ORAL_TABLET | ORAL | Status: DC

## 2014-07-07 NOTE — Telephone Encounter (Signed)
Patient needs clonazePAM (KLONOPIN) 0.5 MG tablet sent to [facility] [address].

## 2014-07-07 NOTE — Telephone Encounter (Signed)
Done, ready to fax  

## 2014-07-07 NOTE — Telephone Encounter (Signed)
Script was faxed to below number.  

## 2014-07-09 ENCOUNTER — Ambulatory Visit: Payer: Commercial Managed Care - HMO | Admitting: Pulmonary Disease

## 2014-07-26 ENCOUNTER — Emergency Department (HOSPITAL_COMMUNITY): Payer: Commercial Managed Care - HMO

## 2014-07-26 ENCOUNTER — Encounter (HOSPITAL_COMMUNITY): Payer: Self-pay | Admitting: *Deleted

## 2014-07-26 ENCOUNTER — Emergency Department (HOSPITAL_COMMUNITY)
Admission: EM | Admit: 2014-07-26 | Discharge: 2014-07-26 | Disposition: A | Discharge status: Home / Self Care | Payer: Commercial Managed Care - HMO | Attending: Emergency Medicine | Admitting: Emergency Medicine

## 2014-07-26 DIAGNOSIS — Z7951 Long term (current) use of inhaled steroids: Secondary | ICD-10-CM | POA: Diagnosis not present

## 2014-07-26 DIAGNOSIS — R0789 Other chest pain: Secondary | ICD-10-CM | POA: Diagnosis not present

## 2014-07-26 DIAGNOSIS — Z86010 Personal history of colonic polyps: Secondary | ICD-10-CM | POA: Diagnosis not present

## 2014-07-26 DIAGNOSIS — I10 Essential (primary) hypertension: Secondary | ICD-10-CM | POA: Insufficient documentation

## 2014-07-26 DIAGNOSIS — Z8669 Personal history of other diseases of the nervous system and sense organs: Secondary | ICD-10-CM | POA: Diagnosis not present

## 2014-07-26 DIAGNOSIS — Z862 Personal history of diseases of the blood and blood-forming organs and certain disorders involving the immune mechanism: Secondary | ICD-10-CM | POA: Insufficient documentation

## 2014-07-26 DIAGNOSIS — Z8709 Personal history of other diseases of the respiratory system: Secondary | ICD-10-CM | POA: Insufficient documentation

## 2014-07-26 DIAGNOSIS — K219 Gastro-esophageal reflux disease without esophagitis: Secondary | ICD-10-CM | POA: Diagnosis not present

## 2014-07-26 DIAGNOSIS — I252 Old myocardial infarction: Secondary | ICD-10-CM | POA: Diagnosis not present

## 2014-07-26 DIAGNOSIS — Z79899 Other long term (current) drug therapy: Secondary | ICD-10-CM | POA: Insufficient documentation

## 2014-07-26 DIAGNOSIS — Z87891 Personal history of nicotine dependence: Secondary | ICD-10-CM | POA: Diagnosis not present

## 2014-07-26 DIAGNOSIS — E039 Hypothyroidism, unspecified: Secondary | ICD-10-CM | POA: Insufficient documentation

## 2014-07-26 DIAGNOSIS — I2510 Atherosclerotic heart disease of native coronary artery without angina pectoris: Secondary | ICD-10-CM | POA: Insufficient documentation

## 2014-07-26 DIAGNOSIS — R079 Chest pain, unspecified: Secondary | ICD-10-CM

## 2014-07-26 DIAGNOSIS — Z791 Long term (current) use of non-steroidal anti-inflammatories (NSAID): Secondary | ICD-10-CM | POA: Diagnosis not present

## 2014-07-26 DIAGNOSIS — J9811 Atelectasis: Secondary | ICD-10-CM | POA: Diagnosis not present

## 2014-07-26 DIAGNOSIS — Z7982 Long term (current) use of aspirin: Secondary | ICD-10-CM | POA: Insufficient documentation

## 2014-07-26 DIAGNOSIS — R1013 Epigastric pain: Secondary | ICD-10-CM | POA: Diagnosis not present

## 2014-07-26 DIAGNOSIS — I517 Cardiomegaly: Secondary | ICD-10-CM | POA: Diagnosis not present

## 2014-07-26 DIAGNOSIS — Z8774 Personal history of (corrected) congenital malformations of heart and circulatory system: Secondary | ICD-10-CM | POA: Diagnosis not present

## 2014-07-26 LAB — BASIC METABOLIC PANEL
Anion gap: 5 (ref 5–15)
BUN: 16 mg/dL (ref 6–23)
CO2: 25 mmol/L (ref 19–32)
Calcium: 8.8 mg/dL (ref 8.4–10.5)
Chloride: 106 mEq/L (ref 96–112)
Creatinine, Ser: 0.98 mg/dL (ref 0.50–1.35)
GFR, EST NON AFRICAN AMERICAN: 80 mL/min — AB (ref >90)
Glucose, Bld: 123 mg/dL — ABNORMAL HIGH (ref 70–99)
Potassium: 3.7 mmol/L (ref 3.5–5.1)
Sodium: 136 mmol/L (ref 135–145)

## 2014-07-26 LAB — CBC
HCT: 45.9 % (ref 39.0–52.0)
Hemoglobin: 14.9 g/dL (ref 13.0–17.0)
MCH: 30.2 pg (ref 26.0–34.0)
MCHC: 32.5 g/dL (ref 30.0–36.0)
MCV: 93.1 fL (ref 78.0–100.0)
Platelets: 271 10*3/uL (ref 150–400)
RBC: 4.93 MIL/uL (ref 4.22–5.81)
RDW: 14.1 % (ref 11.5–15.5)
WBC: 10.6 10*3/uL — ABNORMAL HIGH (ref 4.0–10.5)

## 2014-07-26 LAB — I-STAT TROPONIN, ED: TROPONIN I, POC: 0 ng/mL (ref 0.00–0.08)

## 2014-07-26 MED ORDER — NITROGLYCERIN 0.4 MG SL SUBL: 0.4000 mg | SUBLINGUAL_TABLET | SUBLINGUAL | Status: DC | PRN

## 2014-07-26 MED ORDER — ASPIRIN 81 MG PO CHEW
324.0000 mg | CHEWABLE_TABLET | Freq: Once | ORAL | Status: AC
Start: 2014-07-26 — End: 2014-07-26
  Administered 2014-07-26: 324 mg via ORAL
  Filled 2014-07-26: qty 4

## 2014-07-26 NOTE — ED Provider Notes (Signed)
CSN: [id number]     Arrival date & time 07/26/14  1749 History   First MD Initiated Contact with Patient 07/26/14 1819     Chief Complaint  Patient presents with  . Chest Pain     Patient is a 74 y.o. male presenting with chest pain. The history is provided by the patient. No language interpreter was used.  Chest Pain  [name] presents for evaluation of chest pain. Pain started one hour prior to ED arrival.  He was at the cancer center while his wife was receiving treatment when he developed left-sided chest pain. The pain is described as sharp and pressure type sensation. It's in the left chest and radiates up to the left upper arm. Symptoms are moderate and constant. The pain is worse with deep breaths. He denies any shortness of breath, fevers, cough, diaphoresis, nausea, vomiting. He states he thinks this is related to stress. He denies any leg edema or tenderness. He has no history of DVT or PE. He has a history of prior MI 2.  Past Medical History  Diagnosis Date  . Anemia   . Hyperlipidemia   . Hypertension   . Allergy   . Myocardial infarction   . Ulcer   . GERD (gastroesophageal reflux disease)   . Restless leg syndrome   . Hypothyroidism   . Headache(784.0)   . Obstructive sleep apnea     pt refused  . CAD (coronary artery disease)   . Gastritis   . Bronchitis   . Back pain     low  . Diverticulosis   . Adenomatous polyp of colon 2007  . AVM (arteriovenous malformation)    Past Surgical History  Procedure Laterality Date  . Appendectomy    . Hernia repair    . Tonsillectomy    . Ankle surgery Left   . Colonoscopy  08-23-05    per Dr. Deatra Ina, adenomatous polyps, repeat in 5 yrs   . Esophagogastroduodenoscopy  08-23-05    per Dr. Deatra Ina, cauterized jejunal AVMs   . Skin graft Right     leg  . Left heart catheterization with coronary angiogram N/A 09/08/2012    Procedure: LEFT HEART CATHETERIZATION WITH CORONARY ANGIOGRAM;  Surgeon: Burnell Blanks, MD;   Location: [facility] CATH LAB;  Service: Cardiovascular;  Laterality: N/A;   Family History  Problem Relation Age of Onset  . Heart disease Father   . Stroke Father   . Leukemia Mother   . Alcoholism Paternal Uncle   . Alcoholism Maternal Grandfather    History  Substance Use Topics  . Smoking status: Former Smoker -- 2.00 packs/day for 50 years    Types: Cigarettes    Quit date: 07/23/2002  . Smokeless tobacco: Never Used  . Alcohol Use: No    Review of Systems  Cardiovascular: Positive for chest pain.  All other systems reviewed and are negative.     Allergies  Lipitor; Metoprolol tartrate; Shellfish allergy; Simvastatin; Trazodone and nefazodone; Zolpidem; and Amoxicillin-pot clavulanate  Home Medications   Prior to Admission medications   Medication Sig Start Date End Date Taking? Authorizing Provider  amLODipine (NORVASC) 5 MG tablet TAKE 1 TABLET DAILY 03/09/14   [name], MD  amoxicillin (AMOXIL) 875 MG tablet Take 1 tablet (875 mg total) by mouth 2 (two) times daily. 06/08/14   [name], MD  Ascorbic Acid (VITAMIN C) 1000 MG tablet Take 1,000 mg by mouth daily.      Historical Provider, MD  aspirin 81 MG tablet Take 81 mg by mouth daily.      Historical Provider, MD  Calcium-Magnesium-Vitamin D (CALCIUM MAGNESIUM PO) Take 1 tablet by mouth daily.      Historical Provider, MD  clonazePAM (KLONOPIN) 0.5 MG tablet TAKE 1 TABLET BY MOUTH 3 TIMES A DAY AS NEEDED 07/07/14   [name], MD  cyclobenzaprine (FLEXERIL) 10 MG tablet Take 10 mg by mouth at bedtime. For muscle spasms 08/18/13   [name], MD  diclofenac sodium (VOLTAREN) 1 % GEL Apply 1 application topically 4 (four) times daily. 11/27/10   [name], MD  fish oil-omega-3 fatty acids 1000 MG capsule Take 2 g by mouth daily.      Historical Provider, MD  fluticasone (FLONASE) 50 MCG/ACT nasal spray Place 2 sprays into both nostrils daily. 09/22/13   [name], MD  furosemide (LASIX) 40 MG tablet TAKE  1 TABLET BY MOUTH EVERY DAY 08/18/13   [name], MD  HYDROcodone-acetaminophen (NORCO/VICODIN) 5-325 MG per tablet TAKE 1 TABLET BY MOUTH EVERY 6 HOURS AS NEEDED FOR PAIN 06/08/14   [name], MD  levothyroxine (SYNTHROID, LEVOTHROID) 150 MCG tablet TAKE 1 TABLET DAILY 03/09/14   [name], MD  nitroGLYCERIN (NITROSTAT) 0.4 MG SL tablet Place 1 tablet (0.4 mg total) under the tongue every 5 (five) minutes as needed for chest pain. For chest pain 02/26/12   Burnell Blanks, MD  omeprazole (PRILOSEC) 20 MG capsule TAKE 1 CAPSULE TWICE DAILY 03/09/14   [name], MD  oxyCODONE-acetaminophen (PERCOCET) 10-325 MG per tablet Take 1 tablet by mouth every 6 (six) hours as needed for pain. 06/08/14   [name], MD  ropinirole (REQUIP) 5 MG tablet TAKE 1 TABLET AT BEDTIME 03/09/14   [name], MD   BP 142/84 mmHg  Pulse 72  Resp 17  SpO2 98% Physical Exam  Constitutional: He is oriented to person, place, and time. He appears well-developed and well-nourished.  HENT:  Head: Normocephalic and atraumatic.  Cardiovascular: Normal rate and regular rhythm.   No murmur heard. Pulmonary/Chest: Effort normal and breath sounds normal. No respiratory distress.  Abdominal: Soft. There is no rebound and no guarding.  Mild epigastric tenderness  Musculoskeletal: He exhibits no edema or tenderness.  Neurological: He is alert and oriented to person, place, and time.  Skin: Skin is warm and dry.  Psychiatric: He has a normal mood and affect. His behavior is normal.  Nursing note and vitals reviewed.   ED Course  Procedures (including critical care time) Labs Review Labs Reviewed  CBC - Abnormal; Notable for the following:    WBC 10.6 (*)    All other components within normal limits  BASIC METABOLIC PANEL - Abnormal; Notable for the following:    Glucose, Bld 123 (*)    GFR calc non Af Amer 80 (*)    All other components within normal limits  Randolm Idol, ED    Imaging  Review Dg Chest Port 1 View  07/26/2014   CLINICAL DATA:  Chest pain. Mid chest pain radiating into left shoulder.  EXAM: PORTABLE CHEST - 1 VIEW  COMPARISON:  08/19/2012  FINDINGS: Improved lung aeration with decreased bibasilar atelectasis. Mild cardiomegaly is unchanged. Pulmonary vasculature is normal. No confluent airspace disease. There is no pleural effusion or pneumothorax. No acute osseous abnormalities are seen.  IMPRESSION: Stable cardiomegaly. Improved bibasilar aeration from prior, no acute pulmonary process.   Electronically Signed  By: Jeb Levering M.D.   On: 07/26/2014 18:32     EKG Interpretation   Date/Time:  Monday July 26 2014 17:51:50 EST Ventricular Rate:  67 PR Interval:  170 QRS Duration: 80 QT Interval:  406 QTC Calculation: 429 R Axis:   10 Text Interpretation:  Normal sinus rhythm Low voltage QRS Borderline ECG  Confirmed by Hazle Coca [id number]) on 07/26/2014 6:21:50 PM      MDM   Final diagnoses:  Chest pain, unspecified chest pain type    Patient with history of cardiac disease here for evaluation of chest pain when he was with his wife for her chemotherapy treatments. He states he is under a lot of increased stress lately and he thinks that this may be contributing to his pain. Discussed with patient that he has not had a complete cardiac evaluation in the emergency department despite a normal troponin 1. Patient wants to go home and be with his wife he refuses repeat troponin. Clinical picture is not consistent with PE, CHF, ACS. Discussed with patient importance of cardiology follow-up as well as close return precautions for recurrent pain.    Quintella Reichert, MD 07/27/14 [phone]

## 2014-07-26 NOTE — ED Notes (Signed)
Patient was at the cancer center with his wife while she was receiving treatment and began to have pressure in his chest that began to radiate down his left arm. He denies any other symptoms. Patient states he's been under a lot of stress with his wife's new diagnosis of cancer. She's only been given 3-6 months to live.

## 2014-07-26 NOTE — Discharge Instructions (Signed)

## 2014-08-19 ENCOUNTER — Ambulatory Visit (INDEPENDENT_AMBULATORY_CARE_PROVIDER_SITE_OTHER): Payer: Commercial Managed Care - HMO | Admitting: Family Medicine

## 2014-08-19 ENCOUNTER — Encounter: Payer: Self-pay | Admitting: Family Medicine

## 2014-08-19 VITALS — BP 147/81 | HR 83 | Temp 98.2°F | Ht 70.0 in | Wt 230.0 lb

## 2014-08-19 DIAGNOSIS — J0100 Acute maxillary sinusitis, unspecified: Secondary | ICD-10-CM | POA: Diagnosis not present

## 2014-08-19 MED ORDER — AMOXICILLIN 875 MG PO TABS: 875.0000 mg | ORAL_TABLET | Freq: Two times a day (BID) | ORAL | Status: DC

## 2014-08-19 MED ORDER — HYDROCODONE-HOMATROPINE 5-1.5 MG/5ML PO SYRP: 5.0000 mL | ORAL_SOLUTION | ORAL | Status: DC | PRN

## 2014-08-19 NOTE — Progress Notes (Signed)
Pre visit review using our clinic review tool, if applicable. No additional management support is needed unless otherwise documented below in the visit note. 

## 2014-08-23 ENCOUNTER — Encounter: Payer: Self-pay | Admitting: Family Medicine

## 2014-08-23 NOTE — Progress Notes (Signed)
   Subjective:    Patient ID: [name], male    DOB: Aug 14, 1940, 74 y.o.   MRN: [id number]  HPI Here for one week of sinus pressure, HA, ear aches, ST, and coughing up green sputum. No fever.    Review of Systems  Constitutional: Negative.   HENT: Positive for congestion, postnasal drip and sinus pressure.   Eyes: Negative.   Respiratory: Positive for cough. Negative for shortness of breath and wheezing.        Objective:   Physical Exam  Constitutional: He appears well-developed and well-nourished.  HENT:  Right Ear: External ear normal.  Left Ear: External ear normal.  Nose: Nose normal.  Mouth/Throat: Oropharynx is clear and moist.  Eyes: Conjunctivae are normal.  Pulmonary/Chest: Effort normal and breath sounds normal.  Lymphadenopathy:    He has no cervical adenopathy.          Assessment & Plan:  Add Mucinex

## 2014-08-24 ENCOUNTER — Ambulatory Visit (INDEPENDENT_AMBULATORY_CARE_PROVIDER_SITE_OTHER): Payer: Commercial Managed Care - HMO | Admitting: Family Medicine

## 2014-08-24 ENCOUNTER — Encounter: Payer: Self-pay | Admitting: Family Medicine

## 2014-08-24 VITALS — BP 130/88 | HR 78 | Temp 97.9°F | Ht 70.0 in

## 2014-08-24 DIAGNOSIS — M25579 Pain in unspecified ankle and joints of unspecified foot: Secondary | ICD-10-CM | POA: Diagnosis not present

## 2014-08-24 MED ORDER — NAPROXEN 500 MG PO TABS: 500.0000 mg | ORAL_TABLET | Freq: Two times a day (BID) | ORAL | Status: DC

## 2014-08-24 NOTE — Progress Notes (Signed)
HPI:  R foot swelling: -started yesterday after washing his car -reports sudden pain and swelling in R foot -Hx R leg swelling before and one toe once treated with NSAIDs and resolved - but denies dx of gout -on ROC - neg duplex 1.5 years ago with acute swelling in leg, resolved with nsaids -no hx injury but he was sitting in awkward position for a long time -denies: fevers, malaise, SOB, trauma, fall, ankle sprain  ROS: See pertinent positives and negatives per HPI.  Past Medical History  Diagnosis Date  . Anemia   . Hyperlipidemia   . Hypertension   . Allergy   . Myocardial infarction   . Ulcer   . GERD (gastroesophageal reflux disease)   . Restless leg syndrome   . Hypothyroidism   . Headache(784.0)   . Obstructive sleep apnea     pt refused  . CAD (coronary artery disease)   . Gastritis   . Bronchitis   . Back pain     low  . Diverticulosis   . Adenomatous polyp of colon 2007  . AVM (arteriovenous malformation)     Past Surgical History  Procedure Laterality Date  . Appendectomy    . Hernia repair    . Tonsillectomy    . Ankle surgery Left   . Colonoscopy  08-23-05    per Dr. Deatra Ina, adenomatous polyps, repeat in 5 yrs   . Esophagogastroduodenoscopy  08-23-05    per Dr. Deatra Ina, cauterized jejunal AVMs   . Skin graft Right     leg  . Left heart catheterization with coronary angiogram N/A 09/08/2012    Procedure: LEFT HEART CATHETERIZATION WITH CORONARY ANGIOGRAM;  Surgeon: Burnell Blanks, MD;  Location: [facility] CATH LAB;  Service: Cardiovascular;  Laterality: N/A;    Family History  Problem Relation Age of Onset  . Heart disease Father   . Stroke Father   . Leukemia Mother   . Alcoholism Paternal Uncle   . Alcoholism Maternal Grandfather     History   Social History  . Marital Status: Married    Spouse Name: N/A    Number of Children: 1  . Years of Education: N/A   Occupational History  . Disabled    Social History Main Topics  . Smoking  status: Former Smoker -- 2.00 packs/day for 50 years    Types: Cigarettes    Quit date: 07/23/2002  . Smokeless tobacco: Never Used  . Alcohol Use: No  . Drug Use: No  . Sexual Activity: None   Other Topics Concern  . None   Social History Narrative     Current outpatient prescriptions:  .  amLODipine (NORVASC) 5 MG tablet, TAKE 1 TABLET DAILY, Disp: 90 tablet, Rfl: 3 .  amoxicillin (AMOXIL) 875 MG tablet, Take 1 tablet (875 mg total) by mouth 2 (two) times daily., Disp: 20 tablet, Rfl: 0 .  Ascorbic Acid (VITAMIN C) 1000 MG tablet, Take 1,000 mg by mouth daily.  , Disp: , Rfl:  .  aspirin 81 MG tablet, Take 81 mg by mouth daily.  , Disp: , Rfl:  .  Calcium-Magnesium-Vitamin D (CALCIUM MAGNESIUM PO), Take 1 tablet by mouth daily.  , Disp: , Rfl:  .  clonazePAM (KLONOPIN) 0.5 MG tablet, TAKE 1 TABLET BY MOUTH 3 TIMES A DAY AS NEEDED (Patient taking differently: Take 0.5 mg by mouth 3 (three) times daily as needed for anxiety (or as needed for sleep). ), Disp: 270 tablet, Rfl: 1 .  cyclobenzaprine (  FLEXERIL) 10 MG tablet, Take 10 mg by mouth at bedtime. For muscle spasms, Disp: , Rfl:  .  diclofenac sodium (VOLTAREN) 1 % GEL, Apply 1 application topically 4 (four) times daily., Disp: 5 Tube, Rfl: 10 .  fish oil-omega-3 fatty acids 1000 MG capsule, Take 2 g by mouth daily.  , Disp: , Rfl:  .  fluticasone (FLONASE) 50 MCG/ACT nasal spray, Place 2 sprays into both nostrils daily., Disp: 48 g, Rfl: 3 .  furosemide (LASIX) 40 MG tablet, TAKE 1 TABLET BY MOUTH EVERY DAY, Disp: 30 tablet, Rfl: 11 .  HYDROcodone-acetaminophen (NORCO/VICODIN) 5-325 MG per tablet, TAKE 1 TABLET BY MOUTH EVERY 6 HOURS AS NEEDED FOR PAIN, Disp: 120 tablet, Rfl: 0 .  HYDROcodone-homatropine (HYDROMET) 5-1.5 MG/5ML syrup, Take 5 mLs by mouth every 4 (four) hours as needed., Disp: 240 mL, Rfl: 0 .  levothyroxine (SYNTHROID, LEVOTHROID) 150 MCG tablet, TAKE 1 TABLET DAILY, Disp: 90 tablet, Rfl: 3 .  nitroGLYCERIN  (NITROSTAT) 0.4 MG SL tablet, Place 1 tablet (0.4 mg total) under the tongue every 5 (five) minutes as needed for chest pain. For chest pain, Disp: 25 tablet, Rfl: 6 .  omeprazole (PRILOSEC) 20 MG capsule, TAKE 1 CAPSULE TWICE DAILY, Disp: 180 capsule, Rfl: 3 .  oxyCODONE-acetaminophen (PERCOCET) 10-325 MG per tablet, Take 1 tablet by mouth every 6 (six) hours as needed for pain., Disp: 120 tablet, Rfl: 0 .  ropinirole (REQUIP) 5 MG tablet, TAKE 1 TABLET AT BEDTIME, Disp: 90 tablet, Rfl: 3 .  naproxen (NAPROSYN) 500 MG tablet, Take 1 tablet (500 mg total) by mouth 2 (two) times daily with a meal., Disp: 15 tablet, Rfl: 0  EXAM:  Filed Vitals:   08/24/14 0942  BP: 130/88  Pulse: 78  Temp: 97.9 F (36.6 C)    Body mass index is 0.00 kg/(m^2).  GENERAL: vitals reviewed and listed above, alert, oriented, appears well hydrated and in no acute distress  HEENT: atraumatic, conjunttiva clear, no obvious abnormalities on inspection of external nose and ears  NECK: no obvious masses on inspection  LUNGS: clear to auscultation bilaterally, no wheezes, rales or rhonchi, good air movement  CV: HRRR, no peripheral edema  MS: moves all extremities without noticeable abnormality Antalgic gait Erythema, warmth and swelling of R ankle, no skin breakdown or injury, other joints normal, TP in this area, can move foot and ankle, normal cap refil and nv intact distally  PSYCH: pleasant and cooperative, no obvious depression or anxiety  ASSESSMENT AND PLAN:  Discussed the following assessment and plan:  Ankle pain, unspecified laterality - Plan: naproxen (NAPROSYN) 500 MG tablet  -we discussed possible serious and likely etiologies, workup and treatment, treatment risks and return precautions - likely gout given hx, but discussed for accurate dx tap of joint required - he opted not to pursue this at this time, if worsening, malaise, fevers, inability to move joint advised of ortho clinic this  evening or emergency precuaitons/ED. Discussed tx options with steroids, colchicine or nsaids. -after this discussion, [name] opted for NSAIDs- discussed precautions with his other health issues - he has taken these in the past and fine per his report, renal function ok last labs -has pain medications at home as well -follow up advised with Dr. Sarajane Jews in 2 days - would consider uric acid in 2 weeks to assist in dx if recurrence -of course, we advised Rollen  to return or notify a doctor immediately if symptoms worsen or persist or new concerns arise.  -  Patient advised to return or notify a doctor immediately if symptoms worsen or persist or new concerns arise.  Patient Instructions  -start the naproxen and take 1 tablet twice daily  -seek care immediately if worsening or new symptoms  -follow up with Dr. Sarajane Jews in 2 days     Lucretia Kern.

## 2014-08-24 NOTE — Patient Instructions (Signed)
-  start the naproxen and take 1 tablet twice daily  -seek care immediately if worsening or new symptoms  -follow up with Dr. Sarajane Jews in 2 days

## 2014-08-24 NOTE — Progress Notes (Signed)
Pre visit review using our clinic review tool, if applicable. No additional management support is needed unless otherwise documented below in the visit note. 

## 2014-08-30 ENCOUNTER — Ambulatory Visit (INDEPENDENT_AMBULATORY_CARE_PROVIDER_SITE_OTHER): Payer: Commercial Managed Care - HMO | Admitting: Family Medicine

## 2014-08-30 ENCOUNTER — Encounter: Payer: Self-pay | Admitting: Family Medicine

## 2014-08-30 VITALS — BP 149/79 | HR 77 | Temp 97.8°F

## 2014-08-30 DIAGNOSIS — M1000 Idiopathic gout, unspecified site: Secondary | ICD-10-CM | POA: Diagnosis not present

## 2014-08-30 LAB — URIC ACID: URIC ACID, SERUM: 6.7 mg/dL (ref 4.0–7.8)

## 2014-08-30 MED ORDER — HYDROCODONE-ACETAMINOPHEN 5-325 MG PO TABS: ORAL_TABLET | ORAL | Status: DC

## 2014-08-30 MED ORDER — PREDNISONE 10 MG PO TABS: ORAL_TABLET | ORAL | Status: DC

## 2014-08-30 MED ORDER — METHYLPREDNISOLONE ACETATE 80 MG/ML IJ SUSP
160.0000 mg | Freq: Once | INTRAMUSCULAR | Status: AC
  Administered 2014-08-30: 160 mg via INTRAMUSCULAR

## 2014-08-30 NOTE — Progress Notes (Signed)
   Subjective:    Patient ID: [name], male    DOB: September 30, 1940, 74 y.o.   MRN: [id number]  HPI Here for one week of swelling and pain in the right ankle and foot. No recent trauma. He is taking Naproxen and Vicodin with no relief. He has never had this happen before.    Review of Systems  Constitutional: Negative.   Musculoskeletal: Positive for joint swelling and arthralgias.       Objective:   Physical Exam  Constitutional:  In pain, limping   Musculoskeletal:  The entire right foot is swollen from just above the ankles on down. Very tender, especially around the medial malleolus. Red and warm to the touch.           Assessment & Plan:  This is most likely gout. Given a steroid shot today to be followed by an oral prednisone taper. Check a uric acid level today.

## 2014-08-30 NOTE — Progress Notes (Signed)
Pre visit review using our clinic review tool, if applicable. No additional management support is needed unless otherwise documented below in the visit note. 

## 2014-08-30 NOTE — Addendum Note (Signed)
Addended by: Aggie Hacker A on: 08/30/2014 01:58 PM   Modules accepted: Orders

## 2014-09-18 ENCOUNTER — Other Ambulatory Visit: Payer: Self-pay | Admitting: Family Medicine

## 2014-09-20 ENCOUNTER — Other Ambulatory Visit: Payer: Self-pay | Admitting: Family Medicine

## 2014-09-20 MED ORDER — AMOXICILLIN 875 MG PO TABS: 875.0000 mg | ORAL_TABLET | Freq: Two times a day (BID) | ORAL | Status: DC

## 2014-09-20 NOTE — Telephone Encounter (Signed)
Call in Amoxicillin 875 mg bid for 10 days  

## 2014-09-20 NOTE — Telephone Encounter (Signed)
Pt seen 2/8 and was given amoxicillin (AMOXIL) 875 MG tablet. (upper resp)  Pt got better, but after med was gone, sinus inf came cack.  Pt states sinus draining and pt coughs all the time.  Pt does not need hycodan cough syrup. Pt states it does not do any good except make him "drunk" Pt would like another refill  that will take care of this sinus issue. [facility]

## 2014-09-20 NOTE — Telephone Encounter (Signed)
Done  Left message to advise pt Rx sent to pharmacy

## 2014-10-10 ENCOUNTER — Other Ambulatory Visit: Payer: Self-pay | Admitting: Family Medicine

## 2014-10-11 NOTE — Telephone Encounter (Signed)
He already has a refill for #270 from last December

## 2014-10-12 ENCOUNTER — Other Ambulatory Visit: Payer: Self-pay | Admitting: Family Medicine

## 2014-10-12 NOTE — Telephone Encounter (Signed)
Please note from pharmacy

## 2014-10-13 NOTE — Telephone Encounter (Signed)
NO he already has a refill available

## 2014-10-14 ENCOUNTER — Telehealth: Payer: Self-pay | Admitting: Family Medicine

## 2014-10-14 NOTE — Telephone Encounter (Addendum)
Pt was given extra rx for hydrocodone instead of klonopin. [facility] [place]. Pt is out

## 2014-10-18 NOTE — Telephone Encounter (Signed)
It looks like the script was printed and not called in back in December 2015. I spoke with pharmacy and they did not have the updated refills, so I did give the verbal order that was approved.

## 2014-11-15 ENCOUNTER — Inpatient Hospital Stay (HOSPITAL_COMMUNITY)
Admission: EM | Admit: 2014-11-15 | Discharge: 2014-11-17 | DRG: 247 | Disposition: A | Discharge status: Home / Self Care | Payer: Commercial Managed Care - HMO | Attending: Cardiovascular Disease | Admitting: Cardiovascular Disease

## 2014-11-15 ENCOUNTER — Telehealth: Payer: Self-pay | Admitting: Cardiovascular Disease

## 2014-11-15 ENCOUNTER — Encounter (HOSPITAL_COMMUNITY): Payer: Self-pay | Admitting: Family Medicine

## 2014-11-15 ENCOUNTER — Emergency Department (HOSPITAL_COMMUNITY): Payer: Commercial Managed Care - HMO

## 2014-11-15 ENCOUNTER — Telehealth: Payer: Self-pay | Admitting: Family Medicine

## 2014-11-15 DIAGNOSIS — Z823 Family history of stroke: Secondary | ICD-10-CM | POA: Diagnosis not present

## 2014-11-15 DIAGNOSIS — I25119 Atherosclerotic heart disease of native coronary artery with unspecified angina pectoris: Secondary | ICD-10-CM | POA: Diagnosis not present

## 2014-11-15 DIAGNOSIS — R072 Precordial pain: Secondary | ICD-10-CM | POA: Diagnosis not present

## 2014-11-15 DIAGNOSIS — E785 Hyperlipidemia, unspecified: Secondary | ICD-10-CM | POA: Diagnosis present

## 2014-11-15 DIAGNOSIS — Z888 Allergy status to other drugs, medicaments and biological substances status: Secondary | ICD-10-CM | POA: Diagnosis not present

## 2014-11-15 DIAGNOSIS — R079 Chest pain, unspecified: Secondary | ICD-10-CM

## 2014-11-15 DIAGNOSIS — K219 Gastro-esophageal reflux disease without esophagitis: Secondary | ICD-10-CM | POA: Diagnosis present

## 2014-11-15 DIAGNOSIS — Z79899 Other long term (current) drug therapy: Secondary | ICD-10-CM | POA: Diagnosis not present

## 2014-11-15 DIAGNOSIS — G4733 Obstructive sleep apnea (adult) (pediatric): Secondary | ICD-10-CM | POA: Diagnosis present

## 2014-11-15 DIAGNOSIS — Z811 Family history of alcohol abuse and dependence: Secondary | ICD-10-CM | POA: Diagnosis not present

## 2014-11-15 DIAGNOSIS — G2581 Restless legs syndrome: Secondary | ICD-10-CM | POA: Diagnosis present

## 2014-11-15 DIAGNOSIS — I252 Old myocardial infarction: Secondary | ICD-10-CM | POA: Diagnosis not present

## 2014-11-15 DIAGNOSIS — Z7982 Long term (current) use of aspirin: Secondary | ICD-10-CM | POA: Diagnosis not present

## 2014-11-15 DIAGNOSIS — R7309 Other abnormal glucose: Secondary | ICD-10-CM | POA: Diagnosis present

## 2014-11-15 DIAGNOSIS — Z87891 Personal history of nicotine dependence: Secondary | ICD-10-CM

## 2014-11-15 DIAGNOSIS — I25110 Atherosclerotic heart disease of native coronary artery with unstable angina pectoris: Principal | ICD-10-CM | POA: Diagnosis present

## 2014-11-15 DIAGNOSIS — I200 Unstable angina: Secondary | ICD-10-CM | POA: Diagnosis present

## 2014-11-15 DIAGNOSIS — I10 Essential (primary) hypertension: Secondary | ICD-10-CM | POA: Diagnosis present

## 2014-11-15 DIAGNOSIS — R0602 Shortness of breath: Secondary | ICD-10-CM | POA: Diagnosis present

## 2014-11-15 DIAGNOSIS — I2510 Atherosclerotic heart disease of native coronary artery without angina pectoris: Secondary | ICD-10-CM | POA: Diagnosis present

## 2014-11-15 DIAGNOSIS — Z91013 Allergy to seafood: Secondary | ICD-10-CM | POA: Diagnosis not present

## 2014-11-15 DIAGNOSIS — E039 Hypothyroidism, unspecified: Secondary | ICD-10-CM | POA: Diagnosis present

## 2014-11-15 DIAGNOSIS — R0600 Dyspnea, unspecified: Secondary | ICD-10-CM | POA: Diagnosis not present

## 2014-11-15 DIAGNOSIS — Z806 Family history of leukemia: Secondary | ICD-10-CM

## 2014-11-15 DIAGNOSIS — R001 Bradycardia, unspecified: Secondary | ICD-10-CM | POA: Diagnosis not present

## 2014-11-15 DIAGNOSIS — Z881 Allergy status to other antibiotic agents status: Secondary | ICD-10-CM

## 2014-11-15 DIAGNOSIS — Z7902 Long term (current) use of antithrombotics/antiplatelets: Secondary | ICD-10-CM

## 2014-11-15 DIAGNOSIS — Z8249 Family history of ischemic heart disease and other diseases of the circulatory system: Secondary | ICD-10-CM | POA: Diagnosis not present

## 2014-11-15 HISTORY — DX: Other specified health status: Z78.9

## 2014-11-15 HISTORY — DX: Bradycardia, unspecified: R00.1

## 2014-11-15 LAB — CBC
HEMATOCRIT: 44.5 % (ref 39.0–52.0)
Hemoglobin: 14.4 g/dL (ref 13.0–17.0)
MCH: 29.1 pg (ref 26.0–34.0)
MCHC: 32.4 g/dL (ref 30.0–36.0)
MCV: 90.1 fL (ref 78.0–100.0)
Platelets: 254 10*3/uL (ref 150–400)
RBC: 4.94 MIL/uL (ref 4.22–5.81)
RDW: 14.7 % (ref 11.5–15.5)
WBC: 11.6 10*3/uL — ABNORMAL HIGH (ref 4.0–10.5)

## 2014-11-15 LAB — BASIC METABOLIC PANEL
Anion gap: 10 (ref 5–15)
BUN: 13 mg/dL (ref 6–23)
CO2: 24 mmol/L (ref 19–32)
Calcium: 9.3 mg/dL (ref 8.4–10.5)
Chloride: 103 mmol/L (ref 96–112)
Creatinine, Ser: 0.9 mg/dL (ref 0.50–1.35)
GFR calc Af Amer: >90 mL/min (ref >90)
GFR calc non Af Amer: 82 mL/min — ABNORMAL LOW (ref >90)
Glucose, Bld: 117 mg/dL — ABNORMAL HIGH (ref 70–99)
Potassium: 4.2 mmol/L (ref 3.5–5.1)
SODIUM: 137 mmol/L (ref 135–145)

## 2014-11-15 LAB — HEPATIC FUNCTION PANEL
ALT: 20 U/L (ref 0–53)
AST: 20 U/L (ref 0–37)
Albumin: 3.9 g/dL (ref 3.5–5.2)
Alkaline Phosphatase: 60 U/L (ref 39–117)
BILIRUBIN DIRECT: 0.1 mg/dL (ref 0.0–0.5)
BILIRUBIN INDIRECT: 0.5 mg/dL (ref 0.3–0.9)
BILIRUBIN TOTAL: 0.6 mg/dL (ref 0.3–1.2)
TOTAL PROTEIN: 7.1 g/dL (ref 6.0–8.3)

## 2014-11-15 LAB — PROTIME-INR
INR: 1.05 (ref 0.00–1.49)
PROTHROMBIN TIME: 13.9 s (ref 11.6–15.2)

## 2014-11-15 LAB — TSH: TSH: 0.277 u[IU]/mL — ABNORMAL LOW (ref 0.350–4.500)

## 2014-11-15 LAB — HEPARIN LEVEL (UNFRACTIONATED): Heparin Unfractionated: 0.2 IU/mL — ABNORMAL LOW (ref 0.30–0.70)

## 2014-11-15 LAB — TROPONIN I: Troponin I: <0.03 ng/mL (ref <0.031)

## 2014-11-15 LAB — I-STAT TROPONIN, ED: Troponin i, poc: 0 ng/mL (ref 0.00–0.08)

## 2014-11-15 LAB — BRAIN NATRIURETIC PEPTIDE: B Natriuretic Peptide: 23.5 pg/mL (ref 0.0–100.0)

## 2014-11-15 MED ORDER — ASPIRIN EC 81 MG PO TBEC
81.0000 mg | DELAYED_RELEASE_TABLET | Freq: Every day | ORAL | Status: DC
  Administered 2014-11-17: 10:00:00 81 mg via ORAL
  Filled 2014-11-15: qty 1

## 2014-11-15 MED ORDER — NITROGLYCERIN 0.4 MG SL SUBL
0.4000 mg | SUBLINGUAL_TABLET | SUBLINGUAL | Status: DC | PRN
  Administered 2014-11-15: 0.4 mg via SUBLINGUAL
  Filled 2014-11-15 (×3): qty 1

## 2014-11-15 MED ORDER — LEVOTHYROXINE SODIUM 150 MCG PO TABS
150.0000 ug | ORAL_TABLET | Freq: Every day | ORAL | Status: DC
  Administered 2014-11-16 – 2014-11-17 (×2): 150 ug via ORAL
  Filled 2014-11-15 (×2): qty 1
  Filled 2014-11-15: qty 2

## 2014-11-15 MED ORDER — HYDROCODONE-ACETAMINOPHEN 5-325 MG PO TABS: 1.0000 | ORAL_TABLET | Freq: Four times a day (QID) | ORAL | Status: DC | PRN

## 2014-11-15 MED ORDER — ONDANSETRON HCL 4 MG/2ML IJ SOLN: 4.0000 mg | Freq: Four times a day (QID) | INTRAMUSCULAR | Status: DC | PRN

## 2014-11-15 MED ORDER — ASPIRIN 81 MG PO CHEW
81.0000 mg | CHEWABLE_TABLET | ORAL | Status: AC
Start: 2014-11-16 — End: 2014-11-16
  Administered 2014-11-16: 81 mg via ORAL
  Filled 2014-11-15: qty 1

## 2014-11-15 MED ORDER — CYCLOBENZAPRINE HCL 10 MG PO TABS: 10.0000 mg | ORAL_TABLET | Freq: Three times a day (TID) | ORAL | Status: DC | PRN

## 2014-11-15 MED ORDER — PNEUMOCOCCAL VAC POLYVALENT 25 MCG/0.5ML IJ INJ
0.5000 mL | INJECTION | INTRAMUSCULAR | Status: AC
  Administered 2014-11-17: 0.5 mL via INTRAMUSCULAR
  Filled 2014-11-15 (×3): qty 0.5

## 2014-11-15 MED ORDER — FLUTICASONE PROPIONATE 50 MCG/ACT NA SUSP
2.0000 | Freq: Every day | NASAL | Status: DC
  Administered 2014-11-16: 2 via NASAL
  Filled 2014-11-15 (×2): qty 16

## 2014-11-15 MED ORDER — OXYCODONE HCL 5 MG PO TABS
5.0000 mg | ORAL_TABLET | Freq: Four times a day (QID) | ORAL | Status: DC | PRN
  Administered 2014-11-16: 16:00:00 5 mg via ORAL
  Filled 2014-11-15: qty 1

## 2014-11-15 MED ORDER — SODIUM CHLORIDE 0.9 % IJ SOLN: 3.0000 mL | INTRAMUSCULAR | Status: DC | PRN

## 2014-11-15 MED ORDER — ROPINIROLE HCL 1 MG PO TABS
5.0000 mg | ORAL_TABLET | Freq: Every day | ORAL | Status: DC
  Administered 2014-11-15 – 2014-11-16 (×2): 5 mg via ORAL
  Filled 2014-11-15 (×3): qty 5

## 2014-11-15 MED ORDER — SODIUM CHLORIDE 0.9 % IV SOLN: 250.0000 mL | INTRAVENOUS | Status: DC | PRN

## 2014-11-15 MED ORDER — HEPARIN BOLUS VIA INFUSION
4000.0000 [IU] | Freq: Once | INTRAVENOUS | Status: AC
  Administered 2014-11-15: 4000 [IU] via INTRAVENOUS
  Filled 2014-11-15: qty 4000

## 2014-11-15 MED ORDER — ACETAMINOPHEN 325 MG PO TABS
650.0000 mg | ORAL_TABLET | ORAL | Status: DC | PRN
  Administered 2014-11-17: 04:00:00 650 mg via ORAL
  Filled 2014-11-15: qty 2

## 2014-11-15 MED ORDER — OXYCODONE-ACETAMINOPHEN 10-325 MG PO TABS: 1.0000 | ORAL_TABLET | Freq: Four times a day (QID) | ORAL | Status: DC | PRN

## 2014-11-15 MED ORDER — SODIUM CHLORIDE 0.9 % IV SOLN: INTRAVENOUS | Status: DC

## 2014-11-15 MED ORDER — AMLODIPINE BESYLATE 5 MG PO TABS
5.0000 mg | ORAL_TABLET | Freq: Every day | ORAL | Status: DC
  Administered 2014-11-16 – 2014-11-17 (×2): 5 mg via ORAL
  Filled 2014-11-15 (×2): qty 1

## 2014-11-15 MED ORDER — PANTOPRAZOLE SODIUM 40 MG PO TBEC
40.0000 mg | DELAYED_RELEASE_TABLET | Freq: Two times a day (BID) | ORAL | Status: DC
Start: 2014-11-15 — End: 2014-11-17
  Administered 2014-11-15 – 2014-11-17 (×4): 40 mg via ORAL
  Filled 2014-11-15 (×4): qty 1

## 2014-11-15 MED ORDER — HEPARIN (PORCINE) IN NACL 100-0.45 UNIT/ML-% IJ SOLN
1400.0000 [IU]/h | INTRAMUSCULAR | Status: DC
  Administered 2014-11-15: 1200 [IU]/h via INTRAVENOUS
  Administered 2014-11-16: 1400 [IU]/h via INTRAVENOUS
  Filled 2014-11-15 (×3): qty 250

## 2014-11-15 MED ORDER — CLONAZEPAM 0.5 MG PO TABS: 0.5000 mg | ORAL_TABLET | Freq: Three times a day (TID) | ORAL | Status: DC | PRN

## 2014-11-15 MED ORDER — ASPIRIN 81 MG PO CHEW
324.0000 mg | CHEWABLE_TABLET | Freq: Once | ORAL | Status: AC
  Administered 2014-11-15: 324 mg via ORAL
  Filled 2014-11-15: qty 4

## 2014-11-15 MED ORDER — OXYCODONE-ACETAMINOPHEN 5-325 MG PO TABS
1.0000 | ORAL_TABLET | Freq: Four times a day (QID) | ORAL | Status: DC | PRN
  Administered 2014-11-16: 15:00:00 1 via ORAL
  Filled 2014-11-15: qty 1

## 2014-11-15 MED ORDER — SODIUM CHLORIDE 0.9 % IJ SOLN: 3.0000 mL | Freq: Two times a day (BID) | INTRAMUSCULAR | Status: DC

## 2014-11-15 NOTE — ED Notes (Signed)
Offered food to pt. Daughter will bring pt food

## 2014-11-15 NOTE — ED Notes (Signed)
Pt her for heaviness in chest and SOB. sts x 3 weeks. sts any exertion he gets SOB. sts he has been taking care of sick wife.

## 2014-11-15 NOTE — Telephone Encounter (Signed)
New Message Can leave detailed VM  Pt c/o Shortness Of Breath: STAT if SOB developed within the last 24 hours or pt is noticeably SOB on the phone  1. Are you currently SOB (can you hear that pt is SOB on the phone)? yes  2. How long have you been experiencing SOB? Months, but recently has gotten increasingly worse  3. Are you SOB when sitting or when up moving around? Both equally  4. Are you currently experiencing any other symptoms? Some cold sweat, some sharp pain in chest

## 2014-11-15 NOTE — ED Notes (Signed)
Cardiology at bedside.

## 2014-11-15 NOTE — Progress Notes (Signed)
ANTICOAGULATION CONSULT NOTE - Initial Consult  Pharmacy Consult for heparin Indication: chest pain/ACS  Allergies  Allergen Reactions  . Metoprolol Tartrate     Sever chest pains " flat lined patient"  . Shellfish Allergy Anaphylaxis  . Atorvastatin     myalgias  . Lipitor [Atorvastatin Calcium]     myalgias  . Simvastatin     myalgias  . Trazodone And Nefazodone     Unsteady on feet  . Zolpidem     Chest pain  . Amoxicillin-Pot Clavulanate Rash    States recently prescribed and had no reaction when taken    Patient Measurements: Height: 5\' 10"  (177.8 cm) Weight: 225 lb 12.8 oz (102.422 kg) IBW/kg (Calculated) : 73 Heparin Dosing Weight: 83 kg  Vital Signs: Temp: 97.8 F (36.6 C) (04/25 1600) Temp Source: Oral (04/25 1600) BP: 143/76 mmHg (04/25 1600) Pulse Rate: 75 (04/25 1523)  Labs:  Recent Labs  11/15/14 1205  HGB 14.4  HCT 44.5  PLT 254  CREATININE 0.90    Estimated Creatinine Clearance: 87.7 mL/min (by C-G formula based on Cr of 0.9).   Medical History: Past Medical History  Diagnosis Date  . Anemia   . Hyperlipidemia   . Hypertension   . Allergy   . Myocardial infarction   . GERD (gastroesophageal reflux disease)   . Restless leg syndrome   . Hypothyroidism   . Headache(784.0)   . Obstructive sleep apnea     pt refused  . CAD (coronary artery disease)     a. Nonobst disease by cath 2009. b. Cath 08/2012: moderate nonobstructive CAD - 50% prox LAD, 30% mLAD, 30% OM, 40% mRCA, EF 50-55%.  . Gastritis 2011  . Bronchitis   . Back pain     low  . Diverticulosis   . Adenomatous polyp of colon 2007  . AVM (arteriovenous malformation) 2011    a. S/p argon plasma coagulation and ablation in 2011.  . Statin intolerance   . Bradycardia     a. H/o almost 7sec pause nocturnally during 2011 admission. Also has h/o fatigue with BB.    Medications:  Prescriptions prior to admission  Medication Sig Dispense Refill Last Dose  . amLODipine  (NORVASC) 5 MG tablet TAKE 1 TABLET DAILY 90 tablet 3 11/14/2014 at Unknown time  . Ascorbic Acid (VITAMIN C PO) Take 1 tablet by mouth daily as needed (flu like symptopms).   Past Week at Unknown time  . aspirin 81 MG tablet Take 81 mg by mouth daily.     11/15/2014 at Unknown time  . clonazePAM (KLONOPIN) 0.5 MG tablet TAKE 1 TABLET BY MOUTH 3 TIMES A DAY AS NEEDED (Patient taking differently: Take 0.5 mg by mouth 3 (three) times daily as needed for anxiety (or as needed for sleep). ) 270 tablet 1 11/14/2014 at Unknown time  . diclofenac sodium (VOLTAREN) 1 % GEL Apply 1 application topically 4 (four) times daily. (Patient taking differently: Apply 1 application topically 2 (two) times a week. ) 5 Tube 10 Past Week at Unknown time  . fluticasone (FLONASE) 50 MCG/ACT nasal spray Place 2 sprays into both nostrils daily. 48 g 3 11/15/2014 at Unknown time  . HYDROcodone-acetaminophen (NORCO/VICODIN) 5-325 MG per tablet TAKE 1 TABLET BY MOUTH EVERY 6 HOURS AS NEEDED FOR PAIN (Patient taking differently: TAKE 1 TABLET BY MOUTH EVERY 6 HOURS AS NEEDED FOR MILD PAIN) 120 tablet 0 11/14/2014 at Unknown time  . levothyroxine (SYNTHROID, LEVOTHROID) 150 MCG tablet TAKE 1 TABLET  DAILY 90 tablet 3 11/15/2014 at Unknown time  . naproxen (NAPROSYN) 500 MG tablet Take 1 tablet (500 mg total) by mouth 2 (two) times daily with a meal. 15 tablet 0 11/14/2014 at Unknown time  . nitroGLYCERIN (NITROSTAT) 0.4 MG SL tablet Place 1 tablet (0.4 mg total) under the tongue every 5 (five) minutes as needed for chest pain. For chest pain 25 tablet 6 11/15/2014 at Unknown time  . omeprazole (PRILOSEC) 20 MG capsule TAKE 1 CAPSULE TWICE DAILY 180 capsule 3 11/15/2014 at Unknown time  . oxyCODONE-acetaminophen (PERCOCET) 10-325 MG per tablet Take 1 tablet by mouth every 6 (six) hours as needed for pain. (Patient taking differently: Take 1 tablet by mouth every 6 (six) hours as needed (moderate pain). ) 120 tablet 0 Past Week at Unknown time   . ropinirole (REQUIP) 5 MG tablet TAKE 1 TABLET AT BEDTIME 90 tablet 3 11/14/2014 at Unknown time  . cyclobenzaprine (FLEXERIL) 10 MG tablet TAKE 1 TABLET THREE TIMES DAILY AS NEEDED FOR MUSCLE SPASMS 90 tablet 0 11/13/2014  . furosemide (LASIX) 40 MG tablet TAKE 1 TABLET BY MOUTH EVERY DAY (Patient not taking: Reported on 11/15/2014) 30 tablet 11 Not Taking at Unknown time    Assessment: 74 yo man to start heparin for CP.  His baseline Hg and PTLC are normal. Goal of Therapy:  Heparin level 0.3-0.7 units/ml Monitor platelets by anticoagulation protocol: Yes   Plan:  Heparin bolus 4000 units and drip at 1200 units/hr Check heparin level 6 hours after start Daily HL and CBC while on heparin  Thanks for allowing pharmacy to be a part of this patient's care.  Excell Seltzer, PharmD Clinical Pharmacist, [phone]  11/15/2014,4:10 PM

## 2014-11-15 NOTE — Telephone Encounter (Signed)
I agree with this plan.

## 2014-11-15 NOTE — ED Notes (Addendum)
MD at bedside. Instructed to put pt on 2L O2

## 2014-11-15 NOTE — Consult Note (Signed)
Cardiology Consultation Note  Patient ID: [name], MRN: [id number], DOB/AGE: 04-09-1941 74 y.o. Admit date: 11/15/2014   Date of Consult: 11/15/2014 Primary Physician: Christopher Morale, MD Primary Cardiologist: [name] (last seen 2014)  Chief Complaint: chest pain Reason for Consultation: chest pain  HPI: [name] is a [name] with history of mild-moderate nonobstructive CAD by cath in 2014, HTN, HLD, OSA, bradycardia (7 sec pause nocturnally in 2011), GIB/AVM s/p coagulation/ablation in 2011, remote gastritis, prior tobacco abuse who presented to [facility] today with dyspnea and chest pain. He has h/o nonobstructive CAD initially by cath in 2009, with most recent cath in 2014 showing mild-mod nonobstructive CAD (50% prox LAD, 30% mLAD, 30% OM, 40% mRCA, EF 50-55%). Per review of notes he also has history of dyspnea in 2012 requiring Lasix for LEE/SOB by PCP. 2D Echo 05/2011 showed mild LVH, mild focal basal hypertrophy of the septum, EF 55-60%, no RWMA, grade 1 DD, mild LAE. He stopped smoking 10 years ago but smoked 2ppd/50 years and after quitting still had significant secondhand smoke exposure from his wife's tobacco use. He has been under increased stress over the last 6 months as she was diagnosed with lung cancer and required lobectomy and chemotherapy. He says his CPAP was taken away due to company-reported noncompliance when he says he simply was not wearing it for extended periods of time because he had to check on his wife frequently.   He has noticed increased DOE for several months. He also has noticed episodic chest discomfort occuring exclusively with increased levels of exertion (until today) - says this would come on when he felt he was "overdoing it." He was treated for a sinus infection several months ago with residual cough that he attributes to high pollen count/sinus drainage around this time of year. This morning his SOB was worse than it has ever been. It was  associated chest pressure and dizziness. This lasted for several hours until he presented to the ER where he was given 1 SL NTG with full improvement in pain. He endorses chronic LEE but says he does not take Lasix due to the increased urination it causes. This is not particularly prominent on exam today. He denies any chest pain on inspiration, palpitations, orthopnea, PND, BRBPR, melena, hematemesis, syncope, hemoptysis or weight changes. No recent travel, surgery, bedrest. He sustained a fall onto his hip last week when he tripped over a tree root. In the ER, VSS except O2 sat has tendency to dip in the low 90s at times. Not tachycardic or tachypneic. CXR nonacute. Labs showing glucose 117, normal BNP and normal troponin, WBC 11.6. Received 324mg  ASA and 1 SL NTG.  Past Medical History  Diagnosis Date  . Anemia   . Hyperlipidemia   . Hypertension   . Allergy   . Myocardial infarction   . Ulcer   . GERD (gastroesophageal reflux disease)   . Restless leg syndrome   . Hypothyroidism   . Headache(784.0)   . Obstructive sleep apnea     pt refused  . CAD (coronary artery disease)     a. Nonobst disease by cath 2009. b. Cath 08/2012: moderate nonobstructive CAD - 50% prox LAD, 30% mLAD, 30% OM, 40% mRCA, EF 50-55%.  . Gastritis 2011  . Bronchitis   . Back pain     low  . Diverticulosis   . Adenomatous polyp of colon 2007  . AVM (arteriovenous malformation) 2011    a. S/p argon  plasma coagulation and ablation in 2011.  . Statin intolerance   . Bradycardia     a. H/o almost 7sec pause nocturnally during 2011 admission. Also has h/o fatigue with BB.      Most Recent Cardiac Studies: Cardiac Cath 08/2012 Cardiac Catheterization Operative Report [name] [id number] 2/17/20148:15 AM Christopher Morale, MD Procedure Performed:  1. Left Heart Catheterization 2. Selective Coronary Angiography 3. Left ventricular angiogram Operator: Christopher Chandler, MD Indication: [name] with  history of CAD, HTN, HLD, OSA, bradycardia here today for cardiac cath. He has been followed in the past by [name]. Catheterization in 2009 showed nonobstructive CAD (50% LAD stenosis, 40% RCA stenosis). Echocardiogram November 2012 showed normal LV function and normal wall motion. He does not tolerate statins. He was seen in the ED on 08/19/12 with chest pain by [name] and sent home with plans for outpatient f/u. Troponin was normal and EKG was unchanged. He had been arguing with a friend that day. He began to have pain that lasted for 4 hours. The pain was severe. He is intolerant of statins. I saw him in the office 08/27/12 and he c/o chest pain and SOB with strenuous activities. He has 100 pack years of tobacco abuse but stopped smoking 2009. Procedure Details: The risks, benefits, complications, treatment options, and expected outcomes were discussed with the patient. The patient and/or family concurred with the proposed plan, giving informed consent. The patient was brought to the cath lab after IV hydration was begun and oral premedication was given. The patient was further sedated with Versed and Fentanyl. Allens test negative on right wrist. The right groin was prepped and draped in the usual manner. Using the modified Seldinger access technique, a 5 French sheath was placed in the right femoral artery. Standard diagnostic catheters were used to perform selective coronary angiography. A pigtail catheter was used to perform a left ventricular angiogram. There were no immediate complications. The patient was taken to the recovery area in stable condition.  Hemodynamic Findings: Central aortic pressure: 118/68 Left ventricular pressure: 128/8/15 Angiographic Findings: Left main: No obstructive disease.  Left Anterior Descending Artery: Large caliber vessel that courses to the apex. There is an eccentric 50% stenosis in the proximal vessel. There is a 30% stenosis  in the mid vessel just after a moderate caliber diagonal vessel. The distal vessel has mild plaque. The first two diagonal branches are small in caliber. The third diagonal branch is moderate in caliber and has mild plaque disease.  Circumflex Artery: Moderate caliber vessel with termination into a moderate caliber first obtuse marginal branch. The OM branch has 30% stenosis.  Right Coronary Artery: Large dominant vessel with mild proximal plaque, 40% mid stenosis and mild plaque distally.  Left Ventricular Angiogram: LVEF=50-55%.  Impression: 1. Moderate non-obstructive CAD 2. Normal LV systolic function 3. Non-cardiac chest pain.  Recommendations: Will continue medical management of his CAD. He is intolerant of statins and beta blockers. Will have him see primary care for pulmonary evaluation. Complications: None. The patient tolerated the procedure well.   2D Echo 05/2011 - Left ventricle: The cavity size was normal. Wall thickness was increased in a pattern of mild LVH. There was mild focal basal hypertrophy of the septum. Systolic function was normal. The estimated ejection fraction was in the range of 55% to 60%. Wall motion was normal; there were no regional wall motion abnormalities. Doppler parameters are consistent with abnormal left ventricular relaxation (grade 1 diastolic dysfunction). - Left atrium:  The atrium was mildly dilated.   Surgical History:  Past Surgical History  Procedure Laterality Date  . Appendectomy    . Hernia repair    . Tonsillectomy    . Ankle surgery Left   . Colonoscopy  08-23-05    per Dr. Deatra Ina, adenomatous polyps, repeat in 5 yrs   . Esophagogastroduodenoscopy  08-23-05    per Dr. Deatra Ina, cauterized jejunal AVMs   . Skin graft Right     leg  . Left heart catheterization with coronary angiogram N/A 09/08/2012    Procedure: LEFT HEART CATHETERIZATION WITH CORONARY ANGIOGRAM;  Surgeon: Burnell Blanks, MD;  Location: [facility] CATH  LAB;  Service: Cardiovascular;  Laterality: N/A;     Home Meds: Prior to Admission medications   Medication Sig Start Date End Date Taking? Authorizing Provider  amLODipine (NORVASC) 5 MG tablet TAKE 1 TABLET DAILY 03/09/14  Yes Christopher Morale, MD  Ascorbic Acid (VITAMIN C PO) Take 1 tablet by mouth daily as needed (flu like symptopms).   Yes Historical Provider, MD  aspirin 81 MG tablet Take 81 mg by mouth daily.     Yes Historical Provider, MD  clonazePAM (KLONOPIN) 0.5 MG tablet TAKE 1 TABLET BY MOUTH 3 TIMES A DAY AS NEEDED Patient taking differently: Take 0.5 mg by mouth 3 (three) times daily as needed for anxiety (or as needed for sleep).  07/07/14  Yes Christopher Morale, MD  diclofenac sodium (VOLTAREN) 1 % GEL Apply 1 application topically 4 (four) times daily. Patient taking differently: Apply 1 application topically 2 (two) times a week.  11/27/10  Yes Christopher Morale, MD  fluticasone (FLONASE) 50 MCG/ACT nasal spray Place 2 sprays into both nostrils daily. 09/22/13  Yes Christopher Morale, MD  HYDROcodone-acetaminophen (NORCO/VICODIN) 5-325 MG per tablet TAKE 1 TABLET BY MOUTH EVERY 6 HOURS AS NEEDED FOR PAIN Patient taking differently: TAKE 1 TABLET BY MOUTH EVERY 6 HOURS AS NEEDED FOR MILD PAIN 08/30/14  Yes Christopher Morale, MD  levothyroxine (SYNTHROID, LEVOTHROID) 150 MCG tablet TAKE 1 TABLET DAILY 03/09/14  Yes Christopher Morale, MD  naproxen (NAPROSYN) 500 MG tablet Take 1 tablet (500 mg total) by mouth 2 (two) times daily with a meal. 08/24/14  Yes Lucretia Kern, DO  nitroGLYCERIN (NITROSTAT) 0.4 MG SL tablet Place 1 tablet (0.4 mg total) under the tongue every 5 (five) minutes as needed for chest pain. For chest pain 02/26/12  Yes Burnell Blanks, MD  omeprazole (PRILOSEC) 20 MG capsule TAKE 1 CAPSULE TWICE DAILY 03/09/14  Yes Christopher Morale, MD  oxyCODONE-acetaminophen (PERCOCET) 10-325 MG per tablet Take 1 tablet by mouth every 6 (six) hours as needed for pain. Patient taking differently: Take 1  tablet by mouth every 6 (six) hours as needed (moderate pain).  06/08/14  Yes Christopher Morale, MD  ropinirole (REQUIP) 5 MG tablet TAKE 1 TABLET AT BEDTIME 03/09/14  Yes Christopher Morale, MD  cyclobenzaprine (FLEXERIL) 10 MG tablet TAKE 1 TABLET THREE TIMES DAILY AS NEEDED FOR MUSCLE SPASMS 09/20/14   Christopher Morale, MD  furosemide (LASIX) 40 MG tablet TAKE 1 TABLET BY MOUTH EVERY DAY Patient not taking: Reported on 11/15/2014 08/18/13   Christopher Morale, MD    Inpatient Medications:       Allergies:  Allergies  Allergen Reactions  . Metoprolol Tartrate     Sever chest pains " flat lined patient"  . Shellfish Allergy Anaphylaxis  . Lipitor [Atorvastatin Calcium]  myalgias  . Simvastatin     myalgias  . Trazodone And Nefazodone     Unsteady on feet  . Zolpidem     Chest pain  . Amoxicillin-Pot Clavulanate Rash    History   Social History  . Marital Status: Married    Spouse Name: N/A  . Number of Children: 1  . Years of Education: N/A   Occupational History  . Disabled    Social History Main Topics  . Smoking status: Former Smoker -- 2.00 packs/day for 50 years    Types: Cigarettes    Quit date: 07/23/2002  . Smokeless tobacco: Never Used  . Alcohol Use: No  . Drug Use: No  . Sexual Activity: Not on file   Other Topics Concern  . Not on file   Social History Narrative     Family History  Problem Relation Age of Onset  . Heart disease Father   . Stroke Father   . Leukemia Mother   . Alcoholism Paternal Uncle   . Alcoholism Maternal Grandfather      Review of Systems: Notable for fatigue. All other systems reviewed and are otherwise negative except as noted above.  Labs: POC troponin negative, BNP wnl. Lab Results  Component Value Date   WBC 11.6* 11/15/2014   HGB 14.4 11/15/2014   HCT 44.5 11/15/2014   MCV 90.1 11/15/2014   PLT 254 11/15/2014    Recent Labs Lab 11/15/14 1205  NA 137  K 4.2  CL 103  CO2 24  BUN 13  CREATININE 0.90  CALCIUM 9.3    GLUCOSE 117*   Lab Results  Component Value Date   CHOL 159 10/08/2012   HDL 33.10* 10/08/2012   LDLCALC 96 10/08/2012   TRIG 151.0* 10/08/2012     Radiology/Studies:  Dg Chest Portable 1 View  11/15/2014   CLINICAL DATA:  Chest pain and shortness of breath for 2 weeks.  EXAM: PORTABLE CHEST - 1 VIEW  COMPARISON:  Single view of the chest 07/26/2014 and 08/19/2012.  FINDINGS: The lungs are clear. Heart size is upper normal. No pneumothorax pleural effusion. No focal bony abnormality. Degenerative disease right acromioclavicular joint noted.  IMPRESSION: No acute disease.   Electronically Signed   By: Inge Rise M.D.   On: 11/15/2014 13:14    Wt Readings from Last 3 Encounters:  08/19/14 230 lb (104.327 kg)  06/08/14 231 lb (104.781 kg)  03/11/14 223 lb (101.152 kg)   EKG: NSR 72bpm witjh occasional PVCs, lower voltage QRS, no acute ST-T changes Telemetry: Sinus rhythm/sinus bradycardia (one dip to upper 40s) with occasional PVCs  Physical Exam: Blood pressure 120/57, pulse 70, temperature 97 F (36.1 C), resp. rate 11, SpO2 91 %. General: Well developed, well nourished WM, in no acute distress. Head: Normocephalic, atraumatic, sclera non-icteric, no xanthomas, nares are without discharge.  Neck: Negative for carotid bruits. JVD not elevated. Lungs: Somewhat diminished throughout but otherwise clear bilaterally to auscultation without wheezes, rales, or rhonchi. Breathing is unlabored. Heart: RRR with S1 S2. No murmurs, rubs, or gallops appreciated. Abdomen: Soft, non-tender, non-distended with normoactive bowel sounds. No hepatomegaly. No rebound/guarding. No obvious abdominal masses. Msk:  Strength and tone appear normal for age. Extremities: No clubbing or cyanosis. No edema.  Distal pedal pulses are 2+ and equal bilaterally. Neuro: Alert and oriented X 3. No facial asymmetry. No focal deficit. Moves all extremities spontaneously. Psych:  Responds to questions  appropriately with a normal affect.   Assessment and Plan:  1. Dyspnea/chest discomfort  - concerning for unstable angina - recent exertional chest discomfort and then today with episode of chest discomfort at rest, relieved with SL NTG - admit and cycle troponins - start heparin per pharmacy - continue aspirin, amlodipine - plan cardiac cath in AM. Risks and benefits of cardiac catheterization have been discussed with the patient.  These include bleeding, infection, kidney damage, stroke, heart attack, death. The patient understands these risks and is willing to proceed - if cath is unrevealing suspect he will need pulmonary evaluation given transient dips in his O2 sat. He has no pleuritic chest pain, evidence of DVT on exam, tachypnea, or tachycardia to suggest PE.  - obtain 2D echocardiogram  2. Moderate CAD by cath in 2014  - continue ASA - intolerant of statins and not on BB due to h/o significant bradycardia  3. Former tobacco abuse for 50 years with heavy secondhand exposure after quitting - as above, consider pulm evaluation to evaluate for COPD. 4. HTN, controlled - continue amlodipine. 5. Leukocytosis, may be related to above process. 6. Hyperglycemia - check A1C. 7. Hypothyroidism - continue synthroid. 8. OSA - he reports dispute with CPAP company. F/u pulm as outpatient to revisit. 9. H/o AVMs s/p ablation - no recent reported bleeding. Monitor.   Signed, Melina Copa PA-C 11/15/2014, 1:52 PM Pager: [phone]  Attending Note:   The patient was seen and examined on 4/25.  Agree with assessment and plan as noted above.  Changes made to the above note as needed.  Pt has symptoms of UAP.  I think we should proceed with cardiac cath. We discussed risks/ benefits / options.  He understands and agrees to proceed.     Thayer Headings, Brooke Bonito., MD, [facility] 11/16/2014, 9:08 AM [address],  [place] Pager [phone]

## 2014-11-15 NOTE — Telephone Encounter (Signed)
Spoke with patient who states he has recently noticed increasing SOB over the past several months.  I noted to patient that it has been a while since he saw Dr. [name] (3/14) and advised that I am happy to make him an appointment with Dr. [name] but that it would likely be June before he could see the patient.  I advised that it would be difficult to offer advice since we do not have any recent evaluation by cardiology.  I asked if he has called Dr. Janifer Adie or Dr. Barbie Banner offices and patient denied but states he will call.  As I was looking for an appointment on Dr. Camillia Herter schedule for patient, he hung up.  I am routing to Dr. Camillia Herter primary nurse, Enis Slipper, RN for her knowledge.

## 2014-11-15 NOTE — ED Provider Notes (Signed)
CSN: [id number]     Arrival date & time 11/15/14  1149 History   First MD Initiated Contact with Patient 11/15/14 1206     Chief Complaint  Patient presents with  . Shortness of Breath  . Chest Pain   Patient is a 74 y.o. male presenting with chest pain. The history is provided by the patient and a relative.  Chest Pain Pain location:  Substernal area Pain quality: pressure   Pain severity:  Moderate Onset quality:  Gradual Duration:  1 day Timing:  Intermittent Progression:  Worsening Chronicity:  Recurrent Relieved by:  Nothing Worsened by:  Exertion Associated symptoms: cough, fatigue and shortness of breath   Associated symptoms: no fever, no syncope and not vomiting   Risk factors: coronary artery disease   Pt reports he has had intermittent CP/SOB for past 6 months He reports over past day he has had increasing CP/SOB He reports symptoms worse with exertion He reports increased stress at home   Past Medical History  Diagnosis Date  . Anemia   . Hyperlipidemia   . Hypertension   . Allergy   . Myocardial infarction   . Ulcer   . GERD (gastroesophageal reflux disease)   . Restless leg syndrome   . Hypothyroidism   . Headache(784.0)   . Obstructive sleep apnea     pt refused  . CAD (coronary artery disease)   . Gastritis   . Bronchitis   . Back pain     low  . Diverticulosis   . Adenomatous polyp of colon 2007  . AVM (arteriovenous malformation)    Past Surgical History  Procedure Laterality Date  . Appendectomy    . Hernia repair    . Tonsillectomy    . Ankle surgery Left   . Colonoscopy  08-23-05    per Dr. Deatra Ina, adenomatous polyps, repeat in 5 yrs   . Esophagogastroduodenoscopy  08-23-05    per Dr. Deatra Ina, cauterized jejunal AVMs   . Skin graft Right     leg  . Left heart catheterization with coronary angiogram N/A 09/08/2012    Procedure: LEFT HEART CATHETERIZATION WITH CORONARY ANGIOGRAM;  Surgeon: Burnell Blanks, MD;  Location: [facility] CATH LAB;   Service: Cardiovascular;  Laterality: N/A;   Family History  Problem Relation Age of Onset  . Heart disease Father   . Stroke Father   . Leukemia Mother   . Alcoholism Paternal Uncle   . Alcoholism Maternal Grandfather    History  Substance Use Topics  . Smoking status: Former Smoker -- 2.00 packs/day for 50 years    Types: Cigarettes    Quit date: 07/23/2002  . Smokeless tobacco: Never Used  . Alcohol Use: No    Review of Systems  Constitutional: Positive for fatigue. Negative for fever.  Respiratory: Positive for cough and shortness of breath.   Cardiovascular: Positive for chest pain. Negative for syncope.  Gastrointestinal: Negative for vomiting.  Neurological: Negative for syncope.  All other systems reviewed and are negative.     Allergies  Metoprolol tartrate; Shellfish allergy; Lipitor; Simvastatin; Trazodone and nefazodone; Zolpidem; and Amoxicillin-pot clavulanate  Home Medications   Prior to Admission medications   Medication Sig Start Date End Date Taking? Authorizing Provider  amLODipine (NORVASC) 5 MG tablet TAKE 1 TABLET DAILY 03/09/14   [name], MD  amoxicillin (AMOXIL) 875 MG tablet Take 1 tablet (875 mg total) by mouth 2 (two) times daily. 09/20/14   [name], MD  Ascorbic Acid (  VITAMIN C) 1000 MG tablet Take 1,000 mg by mouth daily.      Historical Provider, MD  aspirin 81 MG tablet Take 81 mg by mouth daily.      Historical Provider, MD  Calcium-Magnesium-Vitamin D (CALCIUM MAGNESIUM PO) Take 1 tablet by mouth daily.      Historical Provider, MD  clonazePAM (KLONOPIN) 0.5 MG tablet TAKE 1 TABLET BY MOUTH 3 TIMES A DAY AS NEEDED Patient taking differently: Take 0.5 mg by mouth 3 (three) times daily as needed for anxiety (or as needed for sleep).  07/07/14   [name], MD  cyclobenzaprine (FLEXERIL) 10 MG tablet TAKE 1 TABLET THREE TIMES DAILY AS NEEDED FOR MUSCLE SPASMS 09/20/14   [name], MD  diclofenac sodium (VOLTAREN) 1 % GEL  Apply 1 application topically 4 (four) times daily. 11/27/10   [name], MD  fish oil-omega-3 fatty acids 1000 MG capsule Take 2 g by mouth daily.      Historical Provider, MD  fluticasone (FLONASE) 50 MCG/ACT nasal spray Place 2 sprays into both nostrils daily. 09/22/13   [name], MD  furosemide (LASIX) 40 MG tablet TAKE 1 TABLET BY MOUTH EVERY DAY 08/18/13   [name], MD  HYDROcodone-acetaminophen (NORCO/VICODIN) 5-325 MG per tablet TAKE 1 TABLET BY MOUTH EVERY 6 HOURS AS NEEDED FOR PAIN 08/30/14   [name], MD  HYDROcodone-homatropine (HYDROMET) 5-1.5 MG/5ML syrup Take 5 mLs by mouth every 4 (four) hours as needed. Patient not taking: Reported on 08/30/2014 08/19/14   [name], MD  levothyroxine (SYNTHROID, LEVOTHROID) 150 MCG tablet TAKE 1 TABLET DAILY 03/09/14   [name], MD  naproxen (NAPROSYN) 500 MG tablet Take 1 tablet (500 mg total) by mouth 2 (two) times daily with a meal. 08/24/14   Lucretia Kern, DO  nitroGLYCERIN (NITROSTAT) 0.4 MG SL tablet Place 1 tablet (0.4 mg total) under the tongue every 5 (five) minutes as needed for chest pain. For chest pain Patient not taking: Reported on 08/30/2014 02/26/12   Burnell Blanks, MD  omeprazole (PRILOSEC) 20 MG capsule TAKE 1 CAPSULE TWICE DAILY 03/09/14   [name], MD  oxyCODONE-acetaminophen (PERCOCET) 10-325 MG per tablet Take 1 tablet by mouth every 6 (six) hours as needed for pain. 06/08/14   [name], MD  predniSONE (DELTASONE) 10 MG tablet Take 4 pills a day for 3 days, then 3 a day for 3 days, then 2 a day for 3 days, then 1 a day for 3 days, then stop 08/30/14   [name], MD  ropinirole (REQUIP) 5 MG tablet TAKE 1 TABLET AT BEDTIME 03/09/14   [name], MD   BP 120/57 mmHg  Pulse 70  Temp(Src) 97 F (36.1 C)  Resp 11  SpO2 91% Physical Exam CONSTITUTIONAL: Well developed/well nourished HEAD: Normocephalic/atraumatic EYES: EOMI/PERRL ENMT: Mucous membranes moist NECK: supple no meningeal  signs SPINE/BACK:entire spine nontender CV: S1/S2 noted, no murmurs/rubs/gallops noted LUNGS: crackles bilaterally, no apparent distress ABDOMEN: soft, nontender, no rebound or guarding, bowel sounds noted throughout abdomen GU:no cva tenderness NEURO: Pt is awake/alert/appropriate, moves all extremitiesx4.  No facial droop.   EXTREMITIES: pulses normal/equal, full ROM SKIN: warm, color normal PSYCH: no abnormalities of mood noted, alert and oriented to situation  ED Course  Procedures   Medications  nitroGLYCERIN (NITROSTAT) SL tablet 0.4 mg (0.4 mg Sublingual Given 11/15/14 1249)  aspirin chewable tablet 324 mg (324 mg Oral Given 11/15/14 1239)  Labs Review Labs Reviewed  CBC - Abnormal; Notable for the following:    WBC 11.6 (*)    All other components within normal limits  BASIC METABOLIC PANEL - Abnormal; Notable for the following:    Glucose, Bld 117 (*)    GFR calc non Af Amer 82 (*)    All other components within normal limits  BRAIN NATRIURETIC PEPTIDE  I-STAT TROPOININ, ED    Imaging Review Dg Chest Portable 1 View  11/15/2014   CLINICAL DATA:  Chest pain and shortness of breath for 2 weeks.  EXAM: PORTABLE CHEST - 1 VIEW  COMPARISON:  Single view of the chest 07/26/2014 and 08/19/2012.  FINDINGS: The lungs are clear. Heart size is upper normal. No pneumothorax pleural effusion. No focal bony abnormality. Degenerative disease right acromioclavicular joint noted.  IMPRESSION: No acute disease.   Electronically Signed   By: Inge Rise M.D.   On: 11/15/2014 13:14     EKG Interpretation   Date/Time:  Monday November 15 2014 11:54:15 EDT Ventricular Rate:  72 PR Interval:  158 QRS Duration: 76 QT Interval:  380 QTC Calculation: 416 R Axis:   39 Text Interpretation:  Sinus rhythm with frequent Premature ventricular  complexes Low voltage QRS Borderline ECG pvc new when compared to prior  Confirmed by Christy Gentles  MD, [place] ([id number]) on 11/15/2014 12:12:54 PM      1:34 PM D/w cardiology Will see patient 2:16 PM Pt improved Lung sounds clear He denies pleuritic CP CP has resolved Awaiting cardiology consult  MDM   Final diagnoses:  Chest pain, rule out acute myocardial infarction    Nursing notes including past medical history and social history reviewed and considered in documentation xrays/imaging reviewed by myself and considered during evaluation Labs/vital reviewed myself and considered during evaluation     Ripley Fraise, MD 11/15/14 1417

## 2014-11-15 NOTE — ED Notes (Signed)
Pt ambulated to restroom with steady gait.

## 2014-11-15 NOTE — Telephone Encounter (Signed)
Patient Name: [name]  DOB: 1941/07/02    Initial Comment Caller states he is shortness of breath. No energy.   Nurse Assessment  Nurse: Raphael Gibney, RN, Vanita Ingles Date/Time (Eastern Time): 11/15/2014 10:27:18 AM  Confirm and document reason for call. If symptomatic, describe symptoms. ---Caller states he is very SOB. No energy. Has some sinus drainage. Has had chest pain about a week ago. Chest feels tight. No fever. Sounds SOB on the phone. His wife has lung cancer and he has been taking care of her.  Has the patient traveled out of the country within the last 30 days? ---No  Does the patient require triage? ---Yes  Related visit to physician within the last 2 weeks? ---No  Does the PT have any chronic conditions? (i.e. diabetes, asthma, etc.) ---Yes  List chronic conditions. ---MI;     Guidelines    Guideline Title Affirmed Question Affirmed Notes  Breathing Difficulty [1] MODERATE difficulty breathing (e.g., speaks in phrases, SOB even at rest, pulse 100-120) AND [2] NEW-onset or WORSE than normal    Final Disposition User   Go to ED Now Raphael Gibney, RN, Vanita Ingles

## 2014-11-15 NOTE — Telephone Encounter (Signed)
Called and spoke to patient. Patient stated being [facility], and feels like someone is sitting on chest. Advised to take symptoms seriously and go to ED now. Patient stated concerns of leaving wife at home and expenses of ambulance. Made patient aware payment options are available for [facility] and to not let finances determine action for help. Patient stated sister is on their way to take him and wife to hospital. Concluded by telling patient will follow-up on chart in next hour to ensure he has checked into ED. Patient verbalized understanding. Spoke to MD Sarajane Jews about patient and episode.

## 2014-11-16 ENCOUNTER — Encounter (HOSPITAL_COMMUNITY)
Admission: EM | Disposition: A | Discharge status: Home / Self Care | Payer: Commercial Managed Care - HMO | Source: Home / Self Care | Attending: Cardiovascular Disease

## 2014-11-16 ENCOUNTER — Encounter (HOSPITAL_COMMUNITY): Payer: Self-pay | Admitting: Cardiology

## 2014-11-16 DIAGNOSIS — Z823 Family history of stroke: Secondary | ICD-10-CM | POA: Diagnosis not present

## 2014-11-16 DIAGNOSIS — E039 Hypothyroidism, unspecified: Secondary | ICD-10-CM | POA: Diagnosis present

## 2014-11-16 DIAGNOSIS — R0602 Shortness of breath: Secondary | ICD-10-CM | POA: Diagnosis present

## 2014-11-16 DIAGNOSIS — K219 Gastro-esophageal reflux disease without esophagitis: Secondary | ICD-10-CM | POA: Diagnosis present

## 2014-11-16 DIAGNOSIS — R0600 Dyspnea, unspecified: Secondary | ICD-10-CM

## 2014-11-16 DIAGNOSIS — Z811 Family history of alcohol abuse and dependence: Secondary | ICD-10-CM | POA: Diagnosis not present

## 2014-11-16 DIAGNOSIS — I200 Unstable angina: Secondary | ICD-10-CM | POA: Diagnosis not present

## 2014-11-16 DIAGNOSIS — Z806 Family history of leukemia: Secondary | ICD-10-CM | POA: Diagnosis not present

## 2014-11-16 DIAGNOSIS — I10 Essential (primary) hypertension: Secondary | ICD-10-CM | POA: Diagnosis present

## 2014-11-16 DIAGNOSIS — R079 Chest pain, unspecified: Secondary | ICD-10-CM

## 2014-11-16 DIAGNOSIS — I25119 Atherosclerotic heart disease of native coronary artery with unspecified angina pectoris: Secondary | ICD-10-CM | POA: Diagnosis not present

## 2014-11-16 DIAGNOSIS — Z8249 Family history of ischemic heart disease and other diseases of the circulatory system: Secondary | ICD-10-CM | POA: Diagnosis not present

## 2014-11-16 DIAGNOSIS — Z91013 Allergy to seafood: Secondary | ICD-10-CM | POA: Diagnosis not present

## 2014-11-16 DIAGNOSIS — I25110 Atherosclerotic heart disease of native coronary artery with unstable angina pectoris: Secondary | ICD-10-CM | POA: Diagnosis present

## 2014-11-16 DIAGNOSIS — G2581 Restless legs syndrome: Secondary | ICD-10-CM | POA: Diagnosis present

## 2014-11-16 DIAGNOSIS — R001 Bradycardia, unspecified: Secondary | ICD-10-CM | POA: Diagnosis not present

## 2014-11-16 DIAGNOSIS — I252 Old myocardial infarction: Secondary | ICD-10-CM | POA: Diagnosis not present

## 2014-11-16 DIAGNOSIS — Z881 Allergy status to other antibiotic agents status: Secondary | ICD-10-CM | POA: Diagnosis not present

## 2014-11-16 DIAGNOSIS — R7309 Other abnormal glucose: Secondary | ICD-10-CM | POA: Diagnosis present

## 2014-11-16 DIAGNOSIS — E785 Hyperlipidemia, unspecified: Secondary | ICD-10-CM | POA: Diagnosis present

## 2014-11-16 DIAGNOSIS — Z87891 Personal history of nicotine dependence: Secondary | ICD-10-CM | POA: Diagnosis not present

## 2014-11-16 DIAGNOSIS — Z7982 Long term (current) use of aspirin: Secondary | ICD-10-CM | POA: Diagnosis not present

## 2014-11-16 DIAGNOSIS — G4733 Obstructive sleep apnea (adult) (pediatric): Secondary | ICD-10-CM | POA: Diagnosis present

## 2014-11-16 DIAGNOSIS — Z888 Allergy status to other drugs, medicaments and biological substances status: Secondary | ICD-10-CM | POA: Diagnosis not present

## 2014-11-16 DIAGNOSIS — Z7902 Long term (current) use of antithrombotics/antiplatelets: Secondary | ICD-10-CM | POA: Diagnosis not present

## 2014-11-16 DIAGNOSIS — Z79899 Other long term (current) drug therapy: Secondary | ICD-10-CM | POA: Diagnosis not present

## 2014-11-16 HISTORY — PX: LEFT HEART CATHETERIZATION WITH CORONARY ANGIOGRAM: SHX5451

## 2014-11-16 LAB — CBC
HEMATOCRIT: 42.6 % (ref 39.0–52.0)
Hemoglobin: 13.5 g/dL (ref 13.0–17.0)
MCH: 28.5 pg (ref 26.0–34.0)
MCHC: 31.7 g/dL (ref 30.0–36.0)
MCV: 90.1 fL (ref 78.0–100.0)
Platelets: 257 10*3/uL (ref 150–400)
RBC: 4.73 MIL/uL (ref 4.22–5.81)
RDW: 14.8 % (ref 11.5–15.5)
WBC: 10.9 10*3/uL — ABNORMAL HIGH (ref 4.0–10.5)

## 2014-11-16 LAB — BASIC METABOLIC PANEL
Anion gap: 11 (ref 5–15)
BUN: 12 mg/dL (ref 6–23)
CO2: 25 mmol/L (ref 19–32)
Calcium: 9 mg/dL (ref 8.4–10.5)
Chloride: 103 mmol/L (ref 96–112)
Creatinine, Ser: 1.03 mg/dL (ref 0.50–1.35)
GFR calc non Af Amer: 70 mL/min — ABNORMAL LOW (ref >90)
GFR, EST AFRICAN AMERICAN: 81 mL/min — AB (ref >90)
Glucose, Bld: 126 mg/dL — ABNORMAL HIGH (ref 70–99)
Potassium: 3.8 mmol/L (ref 3.5–5.1)
SODIUM: 139 mmol/L (ref 135–145)

## 2014-11-16 LAB — LIPID PANEL
Cholesterol: 164 mg/dL (ref 0–200)
HDL: 36 mg/dL — ABNORMAL LOW (ref >39)
LDL CALC: 86 mg/dL (ref 0–99)
TRIGLYCERIDES: 212 mg/dL — AB (ref <150)
Total CHOL/HDL Ratio: 4.6 RATIO
VLDL: 42 mg/dL — ABNORMAL HIGH (ref 0–40)

## 2014-11-16 LAB — TROPONIN I

## 2014-11-16 LAB — POCT ACTIVATED CLOTTING TIME: Activated Clotting Time: 436 seconds

## 2014-11-16 LAB — T4, FREE: Free T4: 1.42 ng/dL (ref 0.80–1.80)

## 2014-11-16 LAB — HEMOGLOBIN A1C
HEMOGLOBIN A1C: 6.2 % — AB (ref 4.8–5.6)
MEAN PLASMA GLUCOSE: 131 mg/dL

## 2014-11-16 SURGERY — LEFT HEART CATHETERIZATION WITH CORONARY ANGIOGRAM

## 2014-11-16 MED ORDER — FENTANYL CITRATE (PF) 100 MCG/2ML IJ SOLN
INTRAMUSCULAR | Status: AC
  Filled 2014-11-16: qty 2

## 2014-11-16 MED ORDER — HEPARIN (PORCINE) IN NACL 2-0.9 UNIT/ML-% IJ SOLN
INTRAMUSCULAR | Status: AC
  Filled 2014-11-16: qty 1000

## 2014-11-16 MED ORDER — NITROGLYCERIN 1 MG/10 ML FOR IR/CATH LAB
INTRA_ARTERIAL | Status: AC
  Filled 2014-11-16: qty 10

## 2014-11-16 MED ORDER — SODIUM CHLORIDE 0.9 % IJ SOLN: 3.0000 mL | INTRAMUSCULAR | Status: DC | PRN

## 2014-11-16 MED ORDER — LIDOCAINE HCL (PF) 1 % IJ SOLN
INTRAMUSCULAR | Status: AC
  Filled 2014-11-16: qty 30

## 2014-11-16 MED ORDER — HEPARIN SODIUM (PORCINE) 1000 UNIT/ML IJ SOLN
INTRAMUSCULAR | Status: AC
  Filled 2014-11-16: qty 1

## 2014-11-16 MED ORDER — TICAGRELOR 90 MG PO TABS
90.0000 mg | ORAL_TABLET | Freq: Two times a day (BID) | ORAL | Status: DC
  Administered 2014-11-17: 02:00:00 90 mg via ORAL
  Filled 2014-11-16 (×3): qty 1

## 2014-11-16 MED ORDER — TICAGRELOR 90 MG PO TABS
ORAL_TABLET | ORAL | Status: AC
  Filled 2014-11-16: qty 2

## 2014-11-16 MED ORDER — SODIUM CHLORIDE 0.9 % IJ SOLN: 3.0000 mL | Freq: Two times a day (BID) | INTRAMUSCULAR | Status: DC

## 2014-11-16 MED ORDER — BIVALIRUDIN 250 MG IV SOLR
INTRAVENOUS | Status: AC
  Filled 2014-11-16: qty 250

## 2014-11-16 MED ORDER — NITROGLYCERIN 0.4 MG/SPRAY TL SOLN
Status: AC
  Filled 2014-11-16: qty 4.9

## 2014-11-16 MED ORDER — MORPHINE SULFATE 2 MG/ML IJ SOLN
2.0000 mg | INTRAMUSCULAR | Status: DC | PRN
  Administered 2014-11-16 (×2): 2 mg via INTRAVENOUS
  Filled 2014-11-16 (×2): qty 1

## 2014-11-16 MED ORDER — MIDAZOLAM HCL 2 MG/2ML IJ SOLN
INTRAMUSCULAR | Status: AC
  Filled 2014-11-16: qty 2

## 2014-11-16 MED ORDER — VERAPAMIL HCL 2.5 MG/ML IV SOLN
INTRAVENOUS | Status: AC
  Filled 2014-11-16: qty 2

## 2014-11-16 MED ORDER — SODIUM CHLORIDE 0.9 % IV SOLN: 250.0000 mL | INTRAVENOUS | Status: DC | PRN

## 2014-11-16 MED ORDER — SODIUM CHLORIDE 0.9 % IV SOLN
INTRAVENOUS | Status: AC
  Administered 2014-11-16: 15:00:00 via INTRAVENOUS

## 2014-11-16 MED ORDER — ADENOSINE 12 MG/4ML IV SOLN
16.0000 mL | Freq: Once | INTRAVENOUS | Status: DC
  Filled 2014-11-16: qty 16

## 2014-11-16 NOTE — Progress Notes (Signed)
TR BAND REMOVAL  LOCATION:    right radial  DEFLATED PER PROTOCOL:    Yes.    TIME BAND OFF / DRESSING APPLIED:    20:30   SITE UPON ARRIVAL:    Level 0  SITE AFTER BAND REMOVAL:    Level 0  REVERSE ALLEN'S TEST:     positive  CIRCULATION SENSATION AND MOVEMENT:    Within Normal Limits   Yes.    COMMENTS:   Pt tolerated removal of TR band without complication, will continue to monitor patient

## 2014-11-16 NOTE — Progress Notes (Signed)
    Subjective:  Mild chest discomfort overnight, pain-free this morning. Breathing is comfortable at rest. No other complaints this morning.  Objective:  Vital Signs in the last 24 hours: Temp:  [97 F (36.1 C)-98.2 F (36.8 C)] 98.1 F (36.7 C) (04/26 0500) Pulse Rate:  [54-85] 85 (04/26 0500) Resp:  [11-26] 21 (04/26 0500) BP: (93-143)/(57-85) 118/77 mmHg (04/26 0500) SpO2:  [91 %-97 %] 94 % (04/26 0500) Weight:  [225 lb (102.059 kg)-225 lb 12.8 oz (102.422 kg)] 225 lb (102.059 kg) (04/26 0500)  Intake/Output from previous day: 04/25 0701 - 04/26 0700 In: 360 [P.O.:360] Out: -   Physical Exam: Pt is alert and oriented, NAD HEENT: normal Neck: JVP - normal, carotids 2+= without bruits Lungs: CTA bilaterally CV: RRR without murmur or gallop Abd: soft, NT, Positive BS, no hepatomegaly Ext: no C/C/E, distal pulses intact and equal Skin: warm/dry no rash  Lab Results:  Recent Labs  11/15/14 1205 11/16/14 0420  WBC 11.6* 10.9*  HGB 14.4 13.5  PLT 254 257    Recent Labs  11/15/14 1205 11/16/14 0420  NA 137 139  K 4.2 3.8  CL 103 103  CO2 24 25  GLUCOSE 117* 126*  BUN 13 12  CREATININE 0.90 1.03    Recent Labs  11/15/14 2209 11/16/14 0420  TROPONINI <0.03 <0.03    Cardiac Studies: EKG 11/16/14: Sinus arrhythmia 80 bpm, no significant ST or T changes.  Tele: Personally reviewed: Normal sinus rhythm with PACs. Episode of marked sinus bradycardia 40 bpm this morning. No pathologic pauses. No sustained arrhythmia.  Assessment/Plan:  1. Unstable angina pectoris: The patient has stabilized on IV heparin. His troponins are negative. His EKG is within normal limits. Symptoms are highly suggestive of progressive angina, now with exertional chest discomfort walking to his mailbox 300 feet. Patient was noted to have moderate CAD with 50% LAD stenosis in 2014. Cardiac catheterization and possible PCI have been recommended. I have again reviewed the risks,  indications, and alternatives to this approach. The patient is scheduled today with Dr. Ellyn Hack. He is nothing by mouth and ready for his heart catheterization. Planned right radial approach explained to the patient.  2. Bradycardia. Would avoid beta blockade. He has had problems with beta blockers in the past. No syncope or other indication for outpatient monitoring at this time. The patient had transient bradycardia while sleeping and suspect this is benign.  3. Chronic dyspnea. Echocardiogram is pending. LVEDP will be assessed at cardiac catheterization. Symptoms seem progressive. The patient no longer smokes but does have a long-standing tobacco history.  Disposition: Pending cardiac catheterization. If stable coronary anatomy and no obstructive disease, could be discharged later today. If he has progressive disease and requires PCI, I advised him he will require overnight hospitalization tonight.  Sherren Mocha, M.D. 11/16/2014, 8:08 AM

## 2014-11-16 NOTE — Progress Notes (Signed)
ANTICOAGULATION CONSULT NOTE - Follow Up Consult  Pharmacy Consult for heparin Indication: chest pain/ACS   Labs:  Recent Labs  11/15/14 1205 11/15/14 1930 11/15/14 2209  HGB 14.4  --   --   HCT 44.5  --   --   PLT 254  --   --   LABPROT  --  13.9  --   INR  --  1.05  --   HEPARINUNFRC  --   --  0.20*  CREATININE 0.90  --   --   TROPONINI  --  <0.03 <0.03     Assessment: 74yo male subtherapeutic on heparin with initial dosing for CP; RN reports that gtt was off ~30min at some point but unclear when.  Goal of Therapy:  Heparin level 0.3-0.7 units/ml   Plan:  Will increase heparin gtt by 2 units/kg/hr to 1400 units/hr and check level in 6hr.  Wynona Neat, PharmD, BCPS  11/16/2014,12:29 AM

## 2014-11-16 NOTE — CV Procedure (Signed)
CARDIAC CATHETERIZATION AND PERCUTANEOUS CORONARY INTERVENTION REPORT  NAME:  [name]   MRN: [id number] DOB:  [birth year]   ADMIT DATE: 11/15/2014 Procedure Date: 11/16/2014  INTERVENTIONAL CARDIOLOGIST: Leonie Man, M.D., MS PRIMARY CARE PROVIDER: [name], MD PRIMARY CARDIOLOGIST:  Dr. [name]  PATIENT:  [name] is a 74 y.o. male with a history of moderate nonobstructive CAD by cath in 2014 [name] - with a moderate proximal LAD lesion that was negative for FFR of 0.84. His last seen in 2014 following his catheterization. He is a former history of smoking, having quit roughly 10 years ago after 2 packs per day for 50 years. His other cardiac risk factors include hypertension and hyperlipidemia. He presented to [facility] emergency room on April 25 with signs and symptoms concerning for unstable angina with progressive worsening class 3-4 angina. He is referred for invasive evaluation with cardiac catheterization plus minus PCI.  PRE-OPERATIVE DIAGNOSIS:    Unstable angina   Known moderate CAD   PROCEDURES PERFORMED:    Left Heart Catheterization with Native Coronary Angiography  via Right Radial  Artery   Left Ventriculography  Percutaneous Coronary Intervention of the Mid RPDA with a Xience Alpine DES 2.25 mm x 15 mm (2.35 mm)   Fractional Flow Reserve  (FFR) measurement of 3 tandem lesions in the mid LAD - FFR for the distal 2 lesions =0.76, pullback to the proximal lesion  FFR was 0.86    FFR guided Perkiness Coronary Intervention of mid LAD with a Xience Alpine DES 2.75 mm x 33 mm (tapered from 3.5-3.0 mm)   PROCEDURE: The patient was brought to the [facility] [place] [facility] in the fasting state and prepped and draped in the usual sterile fashion for Right Radial artery access. A modified Allen's test was performed on the right wrist demonstrating excellent collateral flow for radial access.   Sterile technique  was used including antiseptics, cap, gloves, gown, hand hygiene, mask and sheet. Skin prep: Chlorhexidine.   Consent: Risks of procedure as well as the alternatives and risks of each were explained to the (patient/caregiver). Consent for procedure obtained.   Time Out: Verified patient identification, verified procedure, site/side was marked, verified correct patient position, special equipment/implants available, medications/allergies/relevent history reviewed, required imaging and test results available. Performed.  Access:   Right Radial  Artery: 6 Fr Sheath -  Seldinger Technique (Angiocath Micropuncture Kit)  Radial Cocktail - 10 mL; IV Heparin  5000 Units   Left Heart Catheterization: 5 Fr Catheters advanced or exchanged over a  long exchange safety J-wire under direct fluoroscopic guidance ; TIG 4.0 Catheter advanced first.  Left and Right Coronary Artery Cineangiography: TIG 4.0 Catheter   LV Hemodynamics (LV Gram): Angled pigtail  Sheath removed in the cardiac Cath Lab TR band placement for hemostasis.  TR Band: 1415  Hours; 11 mL air  FINDINGS:  Hemodynamics:   Central Aortic Pressure / Mean: 90/64/77  mmHg  Left Ventricular Pressure / LVEDP: 93/9/14 mmHg  Left Ventriculography:  EF: 55-60  %  Wall Motion: mild apical inferior hypokinesis  Coronary Anatomy:  Dominance:  right  Left Main: normal caliber vessel that bifurcates into the LAD and Circumflex. Angiographically normal.  LAD: normal caliber vessel that courses down to the apex. There is a persistent roughly 50% (somewhat hazy) stenosis in the proximal vessel at the takeoff of the first diagonal branch. Then tandem 60-80% stenoses at the takeoff of the next 2  diagonal branches. Beyond the second 80% stenosis that is at the takeoff of D3, there is minimal disease.  D1: Moderate caliber vessel that is involved in the roughly 50% lesion. There is mild ostial stenosis of roughly 40-50% but otherwise no disease  distally.  D2: Small caliber, bifurcating vessel that is right middle of the extensibility lesion. Minimal disease.  D3: Moderate caliber vessel with minimal involvement  in the LAD lesion. Angiographically normal  Left Circumflex: moderate large-caliber vessel that basically terminates as a major lateral OM branch with mild 20-30% stenosis. There is a small AV groove branch from the mid vessel.    RCA:  Large-caliber, dominant vessel with mild to moderate plaque in the vessel of roughly 20-30%.   Downstream the vessel bifurcates into the Right Posterior Descending Artery (RPDA)  and the Right Posterior AV Groove Branch (RPAV).  RPDA: moderate caliber vessel that reaches two thirds with the apex. In the mid vessel between 2 septal perforators there is a focal 95% stenosis.  RPL Sysytem:The RPAV Begins as a moderate large-caliber vessel that branches into 3 major posterolateral branches that are roughly small to moderate caliber. Minimal disease.  After reviewing the initial angiography, the culprit lesion was thought to be 90% mid RPDA lesion but also there is concern for the mid LAD segmental lesions as described..  Preparation were made to proceed with PCI on the RPDA lesion with FFR guided PCI of the LAD.  Percutaneous Coronary Intervention:     Angiomax bolus and drip administered    Brilinta 180 mg given  Lesion #1: Mid RPDA 90% stenosis reduced to 0%. TIMI 3 flow pre-and post  Guide: 6 Fr  JR4 Guidewire: [place] Predilation Balloon:   [place] Trek 2.0 mm x 12  mm;    10 Atm x 30 Sec, Stent: Xience Alpine DES 2.25 mm x 15  mm;    14 Atm x30 Sec,   Post-dilation with stent balloon: 16 Atm x 30 Sec  Final Diameter: 2.35  Post deployment angiography in multiple views, with and without guidewire in place revealed excellent stent deployment and lesion coverage.  There was no evidence of dissection or perforation.  Lesion #2: Mid LAD sequential focal 50% lesion at D1 followed by 2  tandem 60-80% stenoses at D2 and D3   TIMI 3 flow pre-and post ; lesions from D2-D3 reduced to 0% with DES stent ; lesion at D1 not intervened on due to nonphysiological significant FFR   Guide: 6 Fr   XB LAD 3.5  Guidewire: [place]  Flow Reserve Measurement :   After zeroing the ACIST FFR catheter outside of the body, the catheter was then advanced to just beyond the ostium of the guide catheter for Equalizing   After equalizing, the catheter was advanced down beyond the third lesion and adenosine infusion was initiated according to protocol.   Ace line FFR was 0.89   At roughly 1-1/2 minutes of adenosine, the FFR from the distal lesions was 0.76 -- physiologically   In order to determine the significance of the most proximal lesion, the catheter was pullback proximally.   The FFR involving just the more proximal lesion was 0.85- 0.86, not physiologically significant   Predilation Balloon: Trek 2.5 mm x 12 mm;   10 Atm x 30 Sec Stent: Xience Alpine DES 2.75  mm x 33 mm; Initial attempts to 28 mm stent showed that it was not quite long enough   16 Atm x  30 Sec - distal  diameter 3.0 mm Post-dilation Balloon #1: [place] Trek 3.0  mm x 15  mm;   2 inflations at 18 Atm x 30 Sec, in the mid stent Post-dilation Balloon: [place] Trek 3.5 mm x 12 mm;  in the proximal portion of the stent in order to provide tapered dilation   8  Atm x 30 Sec,  10 Atm x 30  Final Diameter: Tapered from 3.5 mm at the proximal stent, 3.4 mid down to 3.0   Post deployment angiography in multiple views, with and without guidewire in place revealed excellent stent deployment and lesion coverage.  There was no evidence of dissection or perforation.  MEDICATIONS:  Anesthesia:  Local Lidocaine 2 ml  Sedation:  3 mg IV Versed, 125 mcg IV fentanyl ;   Omnipaque Contrast: 270 ml  Anticoagulation:  IV Heparin 5000 Units  Radial Cocktail: 5 mg Verapamil, 400 mcg NTG, 2 ml 2% Lidocaine in 10 ml NS IC NTG 200 g 2    Angiomax Bolus & drip  Anti-Platelet Agent:  Brilinta 180 mg  Adenosine infusion 140 g/Kg/min 2 minutes  PATIENT DISPOSITION:    The patient was transferred to the PACU holding area in a hemodynamicaly stable, chest pain free condition.  The patient tolerated the procedure well, and there were no complications.  EBL:   < 20 ml  The patient was stable before, during, and after the procedure.  POST-OPERATIVE DIAGNOSIS:    Severe 2 vessel disease involving the mid LAD and mid RPDA with both lesions treated with drug-eluting stents.  Residual moderate proximal LAD disease that is not physiologically significant by FFR.  Preserved LVEF with mild inferoapical hypokinesis. Normal LVEDP  PLAN OF CARE:  Standard post radial cath PCI care TR band removal per protocol  Dual antiplatelet therapy for 3 months after which time could potentially switch to Brilinta alone versus aspirin plus Plavix.  Continue aggressive risk factor modification  Anticipate discharge the morning if stable.    Leonie Man, M.D., M.S. Interventional Cardiologist   Pager # [phone]

## 2014-11-16 NOTE — Interval H&P Note (Signed)
History and Physical Interval Note:  11/16/2014 12:02 PM  [name]  has presented today for surgery, with the diagnosis of UNSTABLE ANGINA. The various methods of treatment have been discussed with the patient and family. After consideration of risks, benefits and other options for treatment, the patient has consented to  Procedure(s): LEFT HEART CATHETERIZATION WITH CORONARY ANGIOGRAM (N/A) +/- PCI  as a surgical intervention .  The patient's history has been reviewed, patient examined, no change in status, stable for surgery.  I have reviewed the patient's chart and labs.  Questions were answered to the patient's satisfaction.    Cath Lab Visit (complete for each Cath Lab visit)  Clinical Evaluation Leading to the Procedure:   ACS: Yes.    Non-ACS:    Anginal Classification: CCS IV  Anti-ischemic medical therapy: Minimal Therapy (1 class of medications)  Non-Invasive Test Results: No non-invasive testing performed  Prior CABG: No previous CABG  TIMI SCORE  Patient Information:  TIMI Score is 4  UA/NSTEMI and intermediate-risk features (e.g., TIMI score 3?4) for short-term risk of death or nonfatal MI  Revascularization of the presumed culprit artery   A (8)  Indication: 10; Score: 8    HARDING, DAVID W

## 2014-11-16 NOTE — Progress Notes (Signed)
Echocardiogram 2D Echocardiogram has been performed.  [name] 11/16/2014, 11:21 AM

## 2014-11-16 NOTE — Progress Notes (Signed)
UR completed 

## 2014-11-16 NOTE — H&P (View-Only) (Signed)
    Subjective:  Mild chest discomfort overnight, pain-free this morning. Breathing is comfortable at rest. No other complaints this morning.  Objective:  Vital Signs in the last 24 hours: Temp:  [97 F (36.1 C)-98.2 F (36.8 C)] 98.1 F (36.7 C) (04/26 0500) Pulse Rate:  [54-85] 85 (04/26 0500) Resp:  [11-26] 21 (04/26 0500) BP: (93-143)/(57-85) 118/77 mmHg (04/26 0500) SpO2:  [91 %-97 %] 94 % (04/26 0500) Weight:  [225 lb (102.059 kg)-225 lb 12.8 oz (102.422 kg)] 225 lb (102.059 kg) (04/26 0500)  Intake/Output from previous day: 04/25 0701 - 04/26 0700 In: 360 [P.O.:360] Out: -   Physical Exam: Pt is alert and oriented, NAD HEENT: normal Neck: JVP - normal, carotids 2+= without bruits Lungs: CTA bilaterally CV: RRR without murmur or gallop Abd: soft, NT, Positive BS, no hepatomegaly Ext: no C/C/E, distal pulses intact and equal Skin: warm/dry no rash  Lab Results:  Recent Labs  11/15/14 1205 11/16/14 0420  WBC 11.6* 10.9*  HGB 14.4 13.5  PLT 254 257    Recent Labs  11/15/14 1205 11/16/14 0420  NA 137 139  K 4.2 3.8  CL 103 103  CO2 24 25  GLUCOSE 117* 126*  BUN 13 12  CREATININE 0.90 1.03    Recent Labs  11/15/14 2209 11/16/14 0420  TROPONINI <0.03 <0.03    Cardiac Studies: EKG 11/16/14: Sinus arrhythmia 80 bpm, no significant ST or T changes.  Tele: Personally reviewed: Normal sinus rhythm with PACs. Episode of marked sinus bradycardia 40 bpm this morning. No pathologic pauses. No sustained arrhythmia.  Assessment/Plan:  1. Unstable angina pectoris: The patient has stabilized on IV heparin. His troponins are negative. His EKG is within normal limits. Symptoms are highly suggestive of progressive angina, now with exertional chest discomfort walking to his mailbox 300 feet. Patient was noted to have moderate CAD with 50% LAD stenosis in 2014. Cardiac catheterization and possible PCI have been recommended. I have again reviewed the risks,  indications, and alternatives to this approach. The patient is scheduled today with Dr. Ellyn Hack. He is nothing by mouth and ready for his heart catheterization. Planned right radial approach explained to the patient.  2. Bradycardia. Would avoid beta blockade. He has had problems with beta blockers in the past. No syncope or other indication for outpatient monitoring at this time. The patient had transient bradycardia while sleeping and suspect this is benign.  3. Chronic dyspnea. Echocardiogram is pending. LVEDP will be assessed at cardiac catheterization. Symptoms seem progressive. The patient no longer smokes but does have a long-standing tobacco history.  Disposition: Pending cardiac catheterization. If stable coronary anatomy and no obstructive disease, could be discharged later today. If he has progressive disease and requires PCI, I advised him he will require overnight hospitalization tonight.  Sherren Mocha, M.D. 11/16/2014, 8:08 AM

## 2014-11-17 DIAGNOSIS — Z87891 Personal history of nicotine dependence: Secondary | ICD-10-CM | POA: Diagnosis not present

## 2014-11-17 DIAGNOSIS — I200 Unstable angina: Secondary | ICD-10-CM

## 2014-11-17 DIAGNOSIS — E785 Hyperlipidemia, unspecified: Secondary | ICD-10-CM | POA: Diagnosis not present

## 2014-11-17 DIAGNOSIS — I252 Old myocardial infarction: Secondary | ICD-10-CM | POA: Diagnosis not present

## 2014-11-17 DIAGNOSIS — Z91013 Allergy to seafood: Secondary | ICD-10-CM | POA: Diagnosis not present

## 2014-11-17 DIAGNOSIS — I10 Essential (primary) hypertension: Secondary | ICD-10-CM | POA: Diagnosis not present

## 2014-11-17 DIAGNOSIS — E039 Hypothyroidism, unspecified: Secondary | ICD-10-CM | POA: Diagnosis not present

## 2014-11-17 DIAGNOSIS — G4733 Obstructive sleep apnea (adult) (pediatric): Secondary | ICD-10-CM | POA: Diagnosis not present

## 2014-11-17 DIAGNOSIS — I25110 Atherosclerotic heart disease of native coronary artery with unstable angina pectoris: Secondary | ICD-10-CM | POA: Diagnosis not present

## 2014-11-17 DIAGNOSIS — Z7982 Long term (current) use of aspirin: Secondary | ICD-10-CM | POA: Diagnosis not present

## 2014-11-17 LAB — BASIC METABOLIC PANEL
ANION GAP: 8 (ref 5–15)
BUN: 10 mg/dL (ref 6–23)
CHLORIDE: 103 mmol/L (ref 96–112)
CO2: 24 mmol/L (ref 19–32)
Calcium: 8.8 mg/dL (ref 8.4–10.5)
Creatinine, Ser: 1.01 mg/dL (ref 0.50–1.35)
GFR calc Af Amer: 83 mL/min — ABNORMAL LOW (ref >90)
GFR calc non Af Amer: 72 mL/min — ABNORMAL LOW (ref >90)
Glucose, Bld: 153 mg/dL — ABNORMAL HIGH (ref 70–99)
POTASSIUM: 3.9 mmol/L (ref 3.5–5.1)
SODIUM: 135 mmol/L (ref 135–145)

## 2014-11-17 MED ORDER — MENTHOL 3 MG MT LOZG
1.0000 | LOZENGE | OROMUCOSAL | Status: DC | PRN
  Filled 2014-11-17: qty 9

## 2014-11-17 MED ORDER — DICLOFENAC SODIUM 1 % TD GEL: 1.0000 "application " | TRANSDERMAL | Status: DC

## 2014-11-17 MED ORDER — TICAGRELOR 90 MG PO TABS: 90.0000 mg | ORAL_TABLET | Freq: Two times a day (BID) | ORAL | Status: DC

## 2014-11-17 MED FILL — Sodium Chloride IV Soln 0.9%: INTRAVENOUS | Qty: 50 | Status: AC

## 2014-11-17 NOTE — Progress Notes (Signed)
Chaplain responded to spiritual care consult for pt with suicidal thoughts. Pt expressed clearly that he is not suicidal (at least 3x), but stated "everyone thinks about that once in their life." He made it clear that his belief is that if he committed suicide that he would go to hell. Pt did report elevated levels of stress at home. Pt wife has just completed cancer treatment and still cannot drive. Pt believes that his stress may have even caused his heart troubles. Chaplain encouraged pt to be clear in his needs from his family regarding his stress. Chaplain signing off.   11/17/14 1200  Clinical Encounter Type  Visited With Patient  Visit Type Initial;Spiritual support  Referral From Nurse  Spiritual Encounters  Spiritual Needs Emotional  Stress Factors  Patient Stress Factors Other (Comment);Family relationships (Stress at home)  [name] 11/17/2014 12:14 PM

## 2014-11-17 NOTE — Discharge Instructions (Signed)
PLEASE REMEMBER TO BRING ALL OF YOUR MEDICATIONS TO EACH OF YOUR FOLLOW-UP OFFICE VISITS. ° °PLEASE ATTEND ALL SCHEDULED FOLLOW-UP APPOINTMENTS.  ° °Activity: Increase activity slowly as tolerated. You may shower, but no soaking baths (or swimming) for 1 week. No driving for 2 days. No lifting over 5 lbs for 1 week. No sexual activity for 1 week.  ° °You May Return to Work: in 1 week (if applicable) ° °Wound Care: You may wash cath site gently with soap and water. Keep cath site clean and dry. If you notice pain, swelling, bleeding or pus at your cath site, please call [phone]. ° ° ° °Cardiac Cath Site Care °Refer to this sheet in the next few weeks. These instructions provide you with information on caring for yourself after your procedure. Your caregiver may also give you more specific instructions. Your treatment has been planned according to current medical practices, but problems sometimes occur. Call your caregiver if you have any problems or questions after your procedure. °HOME CARE INSTRUCTIONS °· You may shower 24 hours after the procedure. Remove the bandage (dressing) and gently wash the site with plain soap and water. Gently pat the site dry.  °· Do not apply powder or lotion to the site.  °· Do not sit in a bathtub, swimming pool, or whirlpool for 5 to 7 days.  °· No bending, squatting, or lifting anything over 10 pounds (4.5 kg) as directed by your caregiver.  °· Inspect the site at least twice daily.  °· Do not drive home if you are discharged the same day of the procedure. Have someone else drive you.  °· You may drive 24 hours after the procedure unless otherwise instructed by your caregiver.  °What to expect: °· Any bruising will usually fade within 1 to 2 weeks.  °· Blood that collects in the tissue (hematoma) may be painful to the touch. It should usually decrease in size and tenderness within 1 to 2 weeks.  °SEEK IMMEDIATE MEDICAL CARE IF: °· You have unusual pain at the site or down the  affected limb.  °· You have redness, warmth, swelling, or pain at the site.  °· You have drainage (other than a small amount of blood on the dressing).  °· You have chills.  °· You have a fever or persistent symptoms for more than 72 hours.  °· You have a fever and your symptoms suddenly get worse.  °· Your leg becomes pale, cool, tingly, or numb.  °· You have heavy bleeding from the site. Hold pressure on the site.  °Document Released: 08/11/2010 Document Revised: 06/28/2011 Document Reviewed: 08/11/2010 °[employer]® Patient Information ©2012 [employer]. ° °

## 2014-11-17 NOTE — Progress Notes (Addendum)
Patient Name: [name] Date of Encounter: 11/17/2014  Principal Problem:   Unstable angina Active Problems:   Coronary artery disease   Hypothyroidism   Essential hypertension   Hyperlipidemia LDL goal <70   Obstructive sleep apnea   SOB (shortness of breath)   Primary Cardiologist: Dr [name]  Patient Profile: 74 yo male w/ hx mod CAD, admitted w/ USAP, s/p DES to LAD, mRPDA.  SUBJECTIVE: No chest pain, no SOB, off OSA since was not sleeping > 3 hr/night, has lost 25 lbs after told he was borderline DM  OBJECTIVE Filed Vitals:   11/16/14 2337 11/17/14 0004 11/17/14 0350 11/17/14 0808  BP: 139/75  137/71 117/96  Pulse: 80  83 82  Temp: 98.5 F (36.9 C)  98 F (36.7 C)   TempSrc: Oral  Oral Oral  Resp: 18  20 20   Height:      Weight:  224 lb 6.9 oz (101.8 kg)    SpO2: 95%  94% 97%    Intake/Output Summary (Last 24 hours) at 11/17/14 0910 Last data filed at 11/17/14 1791  Gross per 24 hour  Intake 1459.58 ml  Output   1275 ml  Net 184.58 ml   Filed Weights   11/15/14 1600 11/16/14 0500 11/17/14 0004  Weight: 225 lb 12.8 oz (102.422 kg) 225 lb (102.059 kg) 224 lb 6.9 oz (101.8 kg)    PHYSICAL EXAM General: Well developed, well nourished, male in no acute distress. Head: Normocephalic, atraumatic.  Neck: Supple without bruits, JVD not elevated Lungs:  Resp regular and unlabored, CTA. Heart: RRR, S1, S2, no S3, S4, or murmur; no rub. Abdomen: Soft, non-tender, non-distended, BS + x 4.  Extremities: No clubbing, cyanosis, no edema. R radial cath site good Neuro: Alert and oriented X 3. Moves all extremities spontaneously. Psych: Normal affect.  LABS: CBC:  Recent Labs  11/15/14 1205 11/16/14 0420  WBC 11.6* 10.9*  HGB 14.4 13.5  HCT 44.5 42.6  MCV 90.1 90.1  PLT 254 257   INR:  Recent Labs  11/15/14 1930  INR 5.05   Basic Metabolic Panel:  Recent Labs  11/16/14 0420 11/17/14 0337  NA 139 135  K 3.8 3.9  CL 103 103  CO2  25 24  GLUCOSE 126* 153*  BUN 12 10  CREATININE 1.03 1.01  CALCIUM 9.0 8.8   Liver Function Tests:  Recent Labs  11/15/14 1930  AST 20  ALT 20  ALKPHOS 60  BILITOT 0.6  PROT 7.1  ALBUMIN 3.9   Cardiac Enzymes:  Recent Labs  11/15/14 1930 11/15/14 2209 11/16/14 0420  TROPONINI <0.03 <0.03 <0.03    Recent Labs  11/15/14 1252  TROPIPOC 0.00   BNP:  B NATRIURETIC PEPTIDE  Date/Time Value Ref Range Status  11/15/2014 12:05 PM 23.5 0.0 - 100.0 pg/mL Final   Hemoglobin A1C:  Recent Labs  11/15/14 1930  HGBA1C 6.2*   Fasting Lipid Panel:  Recent Labs  11/16/14 0420  CHOL 164  HDL 36*  LDLCALC 86  TRIG 212*  CHOLHDL 4.6   Thyroid Function Tests:  Recent Labs  11/15/14 1930  TSH 0.277*    TELE: SR, 4.7 sec pause overnight while asleep, PVCs and pairs      Radiology/Studies: Dg Chest Portable 1 View  11/15/2014   CLINICAL DATA:  Chest pain and shortness of breath for 2 weeks.  EXAM: PORTABLE CHEST - 1 VIEW  COMPARISON:  Single view of the chest 07/26/2014 and 08/19/2012.  FINDINGS: The lungs are clear. Heart size is upper normal. No pneumothorax pleural effusion. No focal bony abnormality. Degenerative disease right acromioclavicular joint noted.  IMPRESSION: No acute disease.   Electronically Signed   By: Inge Rise M.D.   On: 11/15/2014 13:14     Current Medications:  . adenosine  16 mL Intravenous Once  . amLODipine  5 mg Oral Daily  . aspirin EC  81 mg Oral Daily  . fluticasone  2 spray Each Nare Daily  . levothyroxine  150 mcg Oral QAC breakfast  . pantoprazole  40 mg Oral BID AC  . pneumococcal 23 valent vaccine  0.5 mL Intramuscular Tomorrow-1000  . ropinirole  5 mg Oral QHS  . ticagrelor  90 mg Oral BID   . sodium chloride 104 mL/hr at 11/16/14 0455    ASSESSMENT AND PLAN:   Unstable angina - S/P DES LAD & mRPDA - doing well, rehab seeing  Active Problems:   Coronary artery disease - see above    Hypothyroidism - on  home rx    Essential hypertension - SBP 117-159 since admit - on home rx, no BB    Hyperlipidemia LDL goal <70 - may agree to try low-dose statin once/week but has had problems after 1 dose of several    Obstructive sleep apnea - would need repeat sleep study to get OSA, but insurance may not pay unless he sleeps longer, which he says is unlikely    SOB (shortness of breath) - hopefully will improve after stenting    Borderline DM - give pt DM diet info, f/u w/ primary MD  Plan - ambulate, d/c today if does well.  Signed, Rosaria Ferries , PA-C 9:10 AM 11/17/2014  Patient seen, examined. Available data reviewed. Agree with findings, assessment, and plan as outlined by Rosaria Ferries, PA-C. The patient is independently interviewed and examined. His right radial site is clear. Lungs are clear. Heart is regular rate and rhythm without murmur. I reviewed his cardiac catheterization findings. He has done well with PCI. We reviewed the importance of medication adherence with brilinta. He understands.  Reviewed notes from cardiac rehabilitation nurse. The patient is under a great deal of stress at home with his wife's illness, financial stressors, and other family issues. We had a lengthy discussion. He is not interested in seeing a counselor and feels that he has a good support system with a close friend who he can talk to. He says "listen doc., I am not suicidal." I advised him to contact Dr. Sarajane Jews if he desires further evaluation or needs help at any time. We are also available. Close follow-up has been arranged. All questions answered.  Also reviewed notes of RN. Arrangements made for social worker to see the patient well he is here to offer further support.  Sherren Mocha, M.D. 11/17/2014 10:09 AM

## 2014-11-17 NOTE — Progress Notes (Signed)
CARDIAC REHAB PHASE I   PRE:  Rate/Rhythm: 52 SR  BP:  Sitting: 117/96        SaO2: 97 RA  MODE:  Ambulation: 850 ft   POST:  Rate/Rhythm: 80 Sr  BP:  Sitting: 142/85         SaO2: 98 RA  Pt ambulated 850 ft on RA, handheld assist, cane, steady gait, tolerated fair.  Pt c/o of moderate DOE, denies cp, dizziness, standing rest x2, sitting rest x1.  Completed stent education.  Reviewed anti-platelet therapy, stent card (pt does not have copy in room, says it was there but now is not, RN notified states he will obtain another) activity restrictions (pt states he has to drive-advised to discuss with MD), ntg, exercise, heart healthy diet, carb counting, portion control, phase 2 cardiac rehab.  Pt declines phase 2 cardiac rehab. Pt verbalized understanding but is not very receptive to education. Pt states he will  "try to do the best I can." Pt has very poor exercise tolerance and appears very short of breath with minimal activity. Pt states "this is normal." Pt states he "has nowhere he can exercise."  Pt states he is under "a lot of stress" and while discussing importance of anti-platelet medication stated "I don't want to live." Pt states he has "thought about suicide but is not suicidal." Pt states he "will not commit suicide because I don't want to go to hell." Pt states his stress comes from "taking care of his wife who has had cancer" and that he "takes care of his grand kids." Pt tearful, states his daughter "dumps grand kids" on him and his wife and it is "too much."  Pt states "nobody cares if I live or die."  Pt also states he will not call 911 if he has chest pain or symptoms of a heart attack. Pt also states he will not talk to a therapist but says he has a friend that he can "vent" to. Pt states he has no questions and "there is nothing else (I) can do for him." Pt in chair with call bell within reach. Notified pt's RN, Clair Gulling, immediately of conversation.    [phone]   Lenna Sciara,  RN, BSN 11/17/2014 9:21 AM

## 2014-11-17 NOTE — Progress Notes (Signed)
I sat and talked to patient after cardiac rehab reported to me.  Offered support.  Patient seems hopeless; near the end of his rope.  Confirmed everything she stated in her note.  I will ask chaplain and SW to see ASAP, and notify Dr. Marlou Porch.

## 2014-11-17 NOTE — Discharge Summary (Signed)
CARDIOLOGY DISCHARGE SUMMARY   Patient ID: [name] MRN: [id number] DOB/AGE: 1941/04/06 74 y.o.  Admit date: 11/15/2014 Discharge date: 11/17/2014  PCP: [name], MD Primary Cardiologist: Dr [name]  Primary Discharge Diagnosis:  Unstable angina pain Secondary Discharge Diagnosis:    Coronary artery disease   Hypothyroidism   Essential hypertension   Hyperlipidemia LDL goal <70   Obstructive sleep apnea   SOB (shortness of breath)  PROCEDURES PERFORMED:   Left Heart Catheterization with Native Coronary Angiography via Right Radial Artery   Left Ventriculography  Percutaneous Coronary Intervention of the Mid RPDA with a Xience Alpine DES 2.25 mm x 15 mm (2.35 mm)   Fractional Flow Reserve (FFR) measurement of 3 tandem lesions in the mid LAD - FFR for the distal 2 lesions =0.76, pullback to the proximal lesion FFR was 0.86   FFR guided Perkiness Coronary Intervention of mid LAD with a Xience Alpine DES 2.75 mm x 33 mm (tapered from 3.5-3.0 mm)  2-D echocardiogram  Hospital Course: [name] is a 74 y.o. male with a history of nonobstructive CAD. He had worsening of class III-IV angina and came to the emergency room where he was admitted for further evaluation and treatment.  His cardiac enzymes were negative for MI. He was pain-free on medical therapy. He was taken to the cath lab on 11/16/2014. Cardiac catheterization results are below. He had a drug-eluting stent to the RPDA and a drug-eluting stent to the LAD after FFR. He tolerated the procedure well.  The next day, he was seen by Dr. Burt Knack and all data were reviewed. He was having no chest pain or shortness of breath with ambulation. There was concern about symptoms of depression and the patient mentioned being under a great deal of stress and not wanting to live. He was seen by the chaplain. He denied suicidal ideation. He was seen by case management as well.  He was seen by cardiac  rehabilitation and educated on stent restrictions, heart-healthy lifestyle modifications and exercise guidelines. His hemoglobin A1c is slightly elevated, and he was given information on a diabetic diet. He is encouraged to modify his eating, and follow-up with his primary care physician. He states his physician has already discussed this with him and he is in the process of losing weight and is encouraged to continue this. No further inpatient workup is indicated and he is considered stable for discharge, to follow up as an outpatient.  Labs:   Lab Results  Component Value Date   WBC 10.9* 11/16/2014   HGB 13.5 11/16/2014   HCT 42.6 11/16/2014   MCV 90.1 11/16/2014   PLT 257 11/16/2014    Recent Labs Lab 11/15/14 1930  11/17/14 0337  NA  --   < > 135  K  --   < > 3.9  CL  --   < > 103  CO2  --   < > 24  BUN  --   < > 10  CREATININE  --   < > 1.01  CALCIUM  --   < > 8.8  PROT 7.1  --   --   BILITOT 0.6  --   --   ALKPHOS 60  --   --   ALT 20  --   --   AST 20  --   --   GLUCOSE  --   < > 153*  < > = values in this interval not displayed.  Recent Labs  11/15/14 1930 11/15/14 2209 11/16/14 0420  TROPONINI <0.03 <0.03 <0.03   Lipid Panel     Component Value Date/Time   CHOL 164 11/16/2014 0420   TRIG 212* 11/16/2014 0420   HDL 36* 11/16/2014 0420   CHOLHDL 4.6 11/16/2014 0420   VLDL 42* 11/16/2014 0420   LDLCALC 86 11/16/2014 0420    B NATRIURETIC PEPTIDE  Date/Time Value Ref Range Status  11/15/2014 12:05 PM 23.5 0.0 - 100.0 pg/mL Final    Recent Labs  11/15/14 1930  INR 1.05      Radiology: Dg Chest Portable 1 View\ 11/15/2014   CLINICAL DATA:  Chest pain and shortness of breath for 2 weeks.  EXAM: PORTABLE CHEST - 1 VIEW  COMPARISON:  Single view of the chest 07/26/2014 and 08/19/2012.  FINDINGS: The lungs are clear. Heart size is upper normal. No pneumothorax pleural effusion. No focal bony abnormality. Degenerative disease right acromioclavicular joint  noted.  IMPRESSION: No acute disease.   Electronically Signed   By: Inge Rise M.D.   On: 11/15/2014 13:14    Cardiac Cath: 11/16/2014 Coronary Anatomy:  Dominance: right  Left Main: normal caliber vessel that bifurcates into the LAD and Circumflex. Angiographically normal. LAD: normal caliber vessel that courses down to the apex. There is a persistent roughly 50% (somewhat hazy) stenosis in the proximal vessel at the takeoff of the first diagonal branch. Then tandem 60-80% stenoses at the takeoff of the next 2 diagonal branches. Beyond the second 80% stenosis that is at the takeoff of D3, there is minimal disease.  D1: Moderate caliber vessel that is involved in the roughly 50% lesion. There is mild ostial stenosis of roughly 40-50% but otherwise no disease distally.  D2: Small caliber, bifurcating vessel that is right middle of the extensibility lesion. Minimal disease.  D3: Moderate caliber vessel with minimal involvement in the LAD lesion. Angiographically normal Left Circumflex: moderate large-caliber vessel that basically terminates as a major lateral OM branch with mild 20-30% stenosis. There is a small AV groove branch from the mid vessel.   RCA: Large-caliber, dominant vessel with mild to moderate plaque in the vessel of roughly 20-30%. Downstream the vessel bifurcates into the Right Posterior Descending Artery (RPDA) and the Right Posterior AV Groove Branch (RPAV).  RPDA: moderate caliber vessel that reaches two thirds with the apex. In the mid vessel between 2 septal perforators there is a focal 95% stenosis. RPL Sysytem:The RPAV Begins as a moderate large-caliber vessel that branches into 3 major posterolateral branches that are roughly small to moderate caliber. Minimal disease. Lesion #1: Mid RPDA 90% stenosis reduced to 0%. TIMI 3 flow pre-and post  Stent: Xience Alpine DES 2.25 mm x 15 mm;  Post deployment angiography in multiple views, with and without  guidewire in place revealed excellent stent deployment and lesion coverage. There was no evidence of dissection or perforation. Lesion #2: Mid LAD sequential focal 50% lesion at D1 followed by 2 tandem 60-80% stenoses at D2 and D3  TIMI 3 flow pre-and post ; lesions from D2-D3 reduced to 0% with DES stent ; lesion at D1 not intervened on due to nonphysiological significant FFR  Flow Reserve Measurement :   After zeroing the ACIST FFR catheter outside of the body, the catheter was then advanced to just beyond the ostium of the guide catheter for Equalizing   After equalizing, the catheter was advanced down beyond the third lesion and adenosine infusion was initiated according to protocol.  Ace line FFR was  0.89  At roughly 1-1/2 minutes of adenosine, the FFR from the distal lesions was 0.76 -- physiologically   In order to determine the significance of the most proximal lesion, the catheter was pullback proximally.  The FFR involving just the more proximal lesion was 0.85- 0.86, not physiologically significant  Stent: Xience Alpine DES 2.75 mm x 33 mm; Initial attempts to 28 mm stent showed that it was not quite long enough   16 Atm x 30 Sec - distal diameter 3.0 mm POST-OPERATIVE DIAGNOSIS:   Severe 2 vessel disease involving the mid LAD and mid RPDA with both lesions treated with drug-eluting stents.  Residual moderate proximal LAD disease that is not physiologically significant by FFR.  Preserved LVEF with mild inferoapical hypokinesis. Normal LVEDP PLAN OF CARE:  Standard post radial cath PCI care TR band removal per protocol  Dual antiplatelet therapy for 3 months after which time could potentially switch to Brilinta alone versus aspirin plus Plavix.  Continue aggressive risk factor modification  Anticipate discharge the morning if stable.  EKG: 11/17/2014 Sinus rhythm, no acute ischemic changes, no Q waves  Echo: 11/16/2014 Conclusions - Left  ventricle: The cavity size was normal. There was mild concentric hypertrophy. Systolic function was normal. The estimated ejection fraction was in the range of 55% to 60%. Wall motion was normal; there were no regional wall motion abnormalities. Doppler parameters are consistent with abnormal left ventricular relaxation (grade 1 diastolic dysfunction). There was no evidence of elevated ventricular filling pressure by Doppler parameters. - Aortic valve: Trileaflet; normal thickness leaflets. There was no regurgitation. - Ascending aorta: The ascending aorta was normal in size. - Mitral valve: Structurally normal valve. There was no regurgitation. - Right ventricle: The cavity size was normal. Wall thickness was normal. Systolic function was normal. - Right atrium: The atrium was normal in size. - Tricuspid valve: There was trivial regurgitation. - Pulmonic valve: There was no regurgitation. - Pulmonary arteries: Systolic pressure was within the normal range. - Inferior vena cava: The vessel was normal in size. - Pericardium, extracardiac: There was no pericardial effusion.  FOLLOW UP PLANS AND APPOINTMENTS Allergies  Allergen Reactions  . Metoprolol Tartrate     Sever chest pains " flat lined patient"  . Shellfish Allergy Anaphylaxis  . Atorvastatin     myalgias  . Lipitor [Atorvastatin Calcium]     myalgias  . Simvastatin     myalgias  . Trazodone And Nefazodone     Unsteady on feet  . Zolpidem     Chest pain  . Amoxicillin-Pot Clavulanate Rash    States recently prescribed and had no reaction when taken     Medication List    TAKE these medications        amLODipine 5 MG tablet  Commonly known as:  NORVASC  TAKE 1 TABLET DAILY     aspirin 81 MG tablet  Take 81 mg by mouth daily.     clonazePAM 0.5 MG tablet  Commonly known as:  KLONOPIN  TAKE 1 TABLET BY MOUTH 3 TIMES A DAY AS NEEDED     cyclobenzaprine 10 MG tablet  Commonly known as:   FLEXERIL  TAKE 1 TABLET THREE TIMES DAILY AS NEEDED FOR MUSCLE SPASMS     diclofenac sodium 1 % Gel  Commonly known as:  VOLTAREN  Apply 1 application topically 2 (two) times a week.     fluticasone 50 MCG/ACT nasal spray  Commonly known as:  FLONASE  Place 2 sprays into  both nostrils daily.     furosemide 40 MG tablet  Commonly known as:  LASIX  TAKE 1 TABLET BY MOUTH EVERY DAY     HYDROcodone-acetaminophen 5-325 MG per tablet  Commonly known as:  NORCO/VICODIN  TAKE 1 TABLET BY MOUTH EVERY 6 HOURS AS NEEDED FOR PAIN     levothyroxine 150 MCG tablet  Commonly known as:  SYNTHROID, LEVOTHROID  TAKE 1 TABLET DAILY     naproxen 500 MG tablet  Commonly known as:  NAPROSYN  Take 1 tablet (500 mg total) by mouth 2 (two) times daily with a meal.     nitroGLYCERIN 0.4 MG SL tablet  Commonly known as:  NITROSTAT  Place 1 tablet (0.4 mg total) under the tongue every 5 (five) minutes as needed for chest pain. For chest pain     omeprazole 20 MG capsule  Commonly known as:  PRILOSEC  TAKE 1 CAPSULE TWICE DAILY     oxyCODONE-acetaminophen 10-325 MG per tablet  Commonly known as:  PERCOCET  Take 1 tablet by mouth every 6 (six) hours as needed for pain.     ropinirole 5 MG tablet  Commonly known as:  REQUIP  TAKE 1 TABLET AT BEDTIME     ticagrelor 90 MG Tabs tablet  Commonly known as:  BRILINTA  Take 1 tablet (90 mg total) by mouth 2 (two) times daily.  Notes to Patient:  NEW MEDICINE     VITAMIN C PO  Take 1 tablet by mouth daily as needed (flu like symptopms).        Discharge Instructions    Diet - low sodium heart healthy    Complete by:  As directed      Increase activity slowly    Complete by:  As directed           Follow-up Information    Follow up with [name], MD.   Specialty:  Cardiology   Why:  The office will call.   Contact information:   [address] [place]  [address] [phone]       BRING ALL MEDICATIONS  WITH YOU TO FOLLOW UP APPOINTMENTS  Time spent with patient to include physician time: 43 min Signed: Rosaria Ferries, PA-C 11/17/2014, 2:27 PM Co-Sign MD

## 2014-11-17 NOTE — Clinical Social Work Note (Signed)
Clinical Social Work Assessment  Patient Details  Name: [name] MRN: [id number] Date of Birth: Mar 09, 1941  Date of referral:  11/17/14               Reason for consult:  Other (Comment Required) (Crisis Counseling)                Permission sought to share information with:    Permission granted to share information::     Name::        Agency::     Relationship::     Contact Information:     Housing/Transportation Living arrangements for the past 2 months:  Single Family Home Source of Information:  Patient Patient Interpreter Needed:  None Criminal Activity/Legal Involvement Pertinent to Current Situation/Hospitalization:  No - Comment as needed Significant Relationships:    Lives with:  Spouse, Adult Children Do you feel safe going back to the place where you live?  Yes Need for family participation in patient care:  No (Coment)  Care giving concerns:  None   Facilities manager / plan:  CSW received consult regarding crisis counseling. CSW talked with patient right before he was being discharge. Patient was alert, oriented, calm and open to speaking with CSW.  Patient and wife live together and his daughter lives across the street with her 3 children. Mr. Minter concerned because he feels his daughter is taking advantage of her mother where the children are concerned.  His wife recently had surgery to remove lung cancer (approx. 4 months ago) and is undergoing chemo and radiation. She is weak and cannot look after the grandchildren, ages 54,7,6 however their daughter will allow the children to come over frequently, and although he says no at time, his wife will always say yes. Mr. Fults also concerned because his wife took down the "No smoking" sign he put up in their home, however he still plans to not allow smoking in his home.  Mr. Greenlaw was seen by pastoral care for suicidal thoughts, however he was adamant that he is not suicidal and indicated that as a [place] he  does not believe in taking your own life. Patient acknowledges that he has stress in his life, but expressed that his faith really helps him. CSW listened empathetically and provided support. Patient expressed appreciation for CSW's visit and support.  No resources requested or needed by patient at this time.   Employment status:  Retired Nurse, adult PT Recommendations:  No Follow Up Information / Referral to community resources:     Patient/Family's Response to care:  Patient plans to continue follow-up with his doctors.  Patient/Family's Understanding of and Emotional Response to Diagnosis, Current Treatment, and Prognosis:  Patient understands that he has heart issues, but reported that he did not want to come to the hospital and explained to CSW what happened and who called EMS.   Emotional Assessment Appearance:  Appears stated age Attitude/Demeanor/Rapport:  Other (Appropriate for situation) Affect (typically observed):  Calm, Appropriate Orientation:  Oriented to Self, Oriented to Place, Oriented to  Time, Oriented to Situation Alcohol / Substance use:  Other (Unknown) Psych involvement (Current and /or in the community):  No (Comment)  Discharge Needs  Concerns to be addressed:  Other (Comment Required (Family Issues) Readmission within the last 30 days:  No Current discharge risk:  None Barriers to Discharge:  No Barriers Identified. Patient discharged soon after talking with CSW.   Sable Feil, LCSW 11/17/2014, 6:58  PM

## 2014-11-17 NOTE — Progress Notes (Signed)
Pt had a 3.37 second pause, then a 4.70 sec pause, pt sleeping, denied CP, SOB, after awakened by nurse, pt in no distress, no complaints, vital signs stable as charted, Charlton Amor PA informed of pauses, will continue to monitor patient.

## 2014-11-19 ENCOUNTER — Other Ambulatory Visit: Payer: Self-pay | Admitting: Cardiovascular Disease

## 2014-11-21 ENCOUNTER — Encounter: Payer: Self-pay | Admitting: Internal Medicine

## 2014-11-21 ENCOUNTER — Telehealth: Payer: Self-pay | Admitting: Internal Medicine

## 2014-11-21 NOTE — Telephone Encounter (Signed)
Mr. [name] called Cardiology this evening because he tells me he's having more chest discomfort. He's had this on/off since Wednesday discharge. It may have been worse this evening with pressure similar to when he came in. He has not missed any medications. He was changed to Brilinta per phone call and hasn't missed any doses. He received a Xience DES to mid right PDA and mid LAD for unstable angina and FFR+ lesions. This pain may be nothing but ECG certainly warranted along with assessment by ER.   Given ? Progressive pain, I informed him to call 911 and come to the ER. He expressed understanding and said he would follow instructions.   Jules Husbands, MD

## 2014-11-25 NOTE — Progress Notes (Signed)
Cardiology Office Note   Date:  11/26/2014   ID:  [name], DOB Apr 07, 1941, MRN [id number]  PCP:  Christopher Morale, MD  Cardiologist:  Dr. [name]     Chief Complaint  Patient presents with  . Coronary Artery Disease     History of Present Illness: [name] is a 74 y.o. male with a hx of CAD, HTN, HL, OSA, bradycardia.  Previously followed by Dr. [name].  Prior cardiac cath in 2009 demonstrated non-obstructive CAD.  Last seen by Dr. [name] 09/2012.    Admitted 4/25-4/27 unstable angina.  CEs remained neg. LHC demonstrated severe RPDA disease and borderline mid LAD disease.  FFR of mid LAD disease demonstrated hemodynamic significance.  He underwent PCI with Xience DES to RPDA and LAD.  He returns for FU.    He felt well the day he went home from the hospital.  However, 1-2 days later, he started having substernal heaviness with assoc dyspnea with any type of activity (CCS class 3).  He denies rest pain however, he is having "minor" discomfort in the office today.  He denies syncope. Has noted near syncope.  He denies orthopnea.  He has noted PND.  He has chronic LE edema without change.  Weights are stable at home.  No cough or wheeze.  He was on his way to the ED last week and took a NTG.  His pain resolved and he went home.  He had to take 2 NTG one day earlier this week.    Studies/Reports Reviewed Today:  Echo 11/06/14 - Mild concentric hypertrophy. EF 55% to 60%. Wall motion was normal; Grade 1 diastolic dysfunction.  - Tricuspid valve: There was trivial regurgitation. - Pericardium, extracardiac: There was no pericardial effusion.  LHC/PCI 11/16/14 EF 55-60% LM:  OK LAD:  prox 50% then 60-80% and 80% beyond D2; D1 50% LCx: dist 20-30% RCA:  20-30%; RPDA mid 95% PCI:  Xience Alpine 2.25 x 15 mm DES to mid RPDA; FFR guided Xience Alpine 2.75 x 33 mm DES to mid LAD  Past Medical History  Diagnosis Date  . Anemia   . Hyperlipidemia     . Hypertension   . Allergy   . Myocardial infarction   . GERD (gastroesophageal reflux disease)   . Restless leg syndrome   . Hypothyroidism   . Headache(784.0)   . Obstructive sleep apnea     pt refused  . CAD (coronary artery disease)     a. Nonobst disease by cath 2009. b. Cath 08/2012: moderate nonobstructive CAD - 50% prox LAD, 30% mLAD, 30% OM, 40% mRCA, EF 50-55%.  . Gastritis 2011  . Bronchitis   . Back pain     low  . Diverticulosis   . Adenomatous polyp of colon 2007  . AVM (arteriovenous malformation) 2011    a. S/p argon plasma coagulation and ablation in 2011.  . Statin intolerance   . Bradycardia     a. H/o almost 7sec pause nocturnally during 2011 admission. Also has h/o fatigue with BB.    Past Surgical History  Procedure Laterality Date  . Appendectomy    . Hernia repair    . Tonsillectomy    . Ankle surgery Left   . Colonoscopy  08-23-05    per Dr. Deatra Ina, adenomatous polyps, repeat in 5 yrs   . Esophagogastroduodenoscopy  08-23-05    per Dr. Deatra Ina, cauterized jejunal AVMs   . Skin graft Right     leg  .  Left heart catheterization with coronary angiogram N/A 09/08/2012    Procedure: LEFT HEART CATHETERIZATION WITH CORONARY ANGIOGRAM;  Surgeon: Burnell Blanks, MD;  Location: [facility] CATH LAB;  Service: Cardiovascular;  Laterality: N/A;  . Left heart catheterization with coronary angiogram N/A 11/16/2014    Procedure: LEFT HEART CATHETERIZATION WITH CORONARY ANGIOGRAM;  Surgeon: Christopher Man, MD;  Location: [facility] C[facility] LAB;  Service: Cardiovascular;  Laterality: N/A;     Current Outpatient Prescriptions  Medication Sig Dispense Refill  . amLODipine (NORVASC) 5 MG tablet TAKE 1 TABLET DAILY 90 tablet 3  . Ascorbic Acid (VITAMIN C PO) Take 1 tablet by mouth daily as needed (flu like symptopms).    [name] aspirin 81 MG tablet Take 81 mg by mouth daily.      . clonazePAM (KLONOPIN) 0.5 MG tablet TAKE 1 TABLET BY MOUTH 3 TIMES A DAY AS NEEDED (Patient taking  differently: Take 0.5 mg by mouth 3 (three) times daily as needed for anxiety (or as needed for sleep). ) 270 tablet 1  . cyclobenzaprine (FLEXERIL) 10 MG tablet TAKE 1 TABLET THREE TIMES DAILY AS NEEDED FOR MUSCLE SPASMS 90 tablet 0  . diclofenac sodium (VOLTAREN) 1 % GEL Apply 1 application topically 2 (two) times a week. 5 Tube 10  . fluticasone (FLONASE) 50 MCG/ACT nasal spray Place 2 sprays into both nostrils daily. 48 g 3  . furosemide (LASIX) 40 MG tablet TAKE 1 TABLET BY MOUTH EVERY DAY 30 tablet 11  . HYDROcodone-acetaminophen (NORCO/VICODIN) 5-325 MG per tablet TAKE 1 TABLET BY MOUTH EVERY 6 HOURS AS NEEDED FOR PAIN (Patient taking differently: TAKE 1 TABLET BY MOUTH EVERY 6 HOURS AS NEEDED FOR MILD PAIN) 120 tablet 0  . levothyroxine (SYNTHROID, LEVOTHROID) 150 MCG tablet TAKE 1 TABLET DAILY 90 tablet 3  . naproxen (NAPROSYN) 500 MG tablet Take 1 tablet (500 mg total) by mouth 2 (two) times daily with a meal. 15 tablet 0  . NITROSTAT 0.4 MG SL tablet PLACE 1 TABLET UNDER TONGUE EVERY 5 MINUTES AS NEEDED FOR CHEST PAIN 25 tablet 0  . omeprazole (PRILOSEC) 20 MG capsule TAKE 1 CAPSULE TWICE DAILY 180 capsule 3  . oxyCODONE-acetaminophen (PERCOCET) 10-325 MG per tablet Take 1 tablet by mouth every 6 (six) hours as needed for pain. (Patient taking differently: Take 1 tablet by mouth every 6 (six) hours as needed (moderate pain). ) 120 tablet 0  . ropinirole (REQUIP) 5 MG tablet TAKE 1 TABLET AT BEDTIME 90 tablet 3  . ticagrelor (BRILINTA) 90 MG TABS tablet Take 1 tablet (90 mg total) by mouth 2 (two) times daily. 60 tablet 11   No current facility-administered medications for this visit.    Allergies:   Metoprolol tartrate; Shellfish allergy; Atorvastatin; Lipitor; Simvastatin; Trazodone and nefazodone; Zolpidem; and Amoxicillin-pot clavulanate    Social History:  The patient  reports that he quit smoking about 12 years ago. His smoking use included Cigarettes. He has a 100 pack-year  smoking history. He has never used smokeless tobacco. He reports that he does not drink alcohol or use illicit drugs.   Family History:  The patient's family history includes Alcoholism in his maternal grandfather and paternal uncle; Heart attack in his father; Heart disease in his father; Leukemia in his mother; Stroke in his father.    ROS:   Please see the history of present illness.   Review of Systems  Cardiovascular: Positive for chest pain, dyspnea on exertion, orthopnea and paroxysmal nocturnal dyspnea.  All other systems  reviewed and are negative.     PHYSICAL EXAM: VS:  BP 125/82 mmHg  Pulse 68  Ht 5\' 10"  (1.778 m)  Wt 223 lb (101.152 kg)  BMI 32.00 kg/m2    Wt Readings from Last 3 Encounters:  11/26/14 223 lb (101.152 kg)  11/17/14 224 lb 6.9 oz (101.8 kg)  08/19/14 230 lb (104.327 kg)     GEN: Well nourished, well developed, in no acute distress HEENT: normal Neck: no JVD, no carotid bruits, no masses Cardiac:  Normal S1/S2, RRR; no murmur ,  no rubs or gallops, no edema ; right wrist without hematoma or mass  Respiratory:  Crackles in bases bilaterally, no wheezing, rhonchi  GI: soft, nontender, nondistended, + BS MS: no deformity or atrophy Skin: warm and dry  Neuro:  CNs II-XII intact, Strength and sensation are intact Psych: Normal affect   EKG:  EKG is ordered today.  It demonstrates:   NSR, HR 71, nonspecific ST-T wave changes, no change from prior tracing   Recent Labs: 11/15/2014: ALT 20; B Natriuretic Peptide 23.5; TSH 0.277* 11/16/2014: Hemoglobin 13.5; Platelets 257 11/17/2014: BUN 10; Creatinine 1.01; Potassium 3.9; Sodium 135    Lipid Panel    Component Value Date/Time   CHOL 164 11/16/2014 0420   TRIG 212* 11/16/2014 0420   HDL 36* 11/16/2014 0420   CHOLHDL 4.6 11/16/2014 0420   VLDL 42* 11/16/2014 0420   LDLCALC 86 11/16/2014 0420      ASSESSMENT AND PLAN:  Coronary artery disease involving native coronary artery of native heart  with other form of angina pectoris The patient has had recurrent symptoms of angina since discharge from the hospital. The symptoms are nitroglycerin responsive. ECG is unchanged. I have recommended admission to the hospital today for relook cardiac catheterization. I discussed this with Dr. Ron Parker (DOD). He agrees. He also saw the patient. We have encouraged the patient to go by ambulance but he prefers to have a family member come pick him up.  We'll plan on starting IV heparin, obtain chest x-ray and BNP as well as serial cardiac markers. We will try to arrange cardiac catheterization today. Risks and benefits of cardiac catheterization have been discussed with the patient.  These include bleeding, infection, kidney damage, stroke, heart attack, death.  The patient understands these risks and is willing to proceed.   Essential hypertension Controlled.   Hyperlipidemia  He is intol of statins.    Current medicines are reviewed at length with the patient today.  Concerns regarding medicines are as outlined above.  The following changes have been made:    None    Labs/ tests ordered today include:   Orders Placed This Encounter  Procedures  . EKG 12-Lead    Disposition:   Admit to telemetry at [facility] today.   Signed, Versie Starks, MHS 11/26/2014 8:39 AM    [facility] [place], [place], [place]  [address] Phone: [phone]; Fax: [phone]

## 2014-11-26 ENCOUNTER — Encounter: Payer: Self-pay | Admitting: Physician Assistant

## 2014-11-26 ENCOUNTER — Ambulatory Visit (INDEPENDENT_AMBULATORY_CARE_PROVIDER_SITE_OTHER): Payer: Commercial Managed Care - HMO | Admitting: Physician Assistant

## 2014-11-26 ENCOUNTER — Encounter (HOSPITAL_COMMUNITY)
Admission: AD | Disposition: A | Discharge status: Home / Self Care | Payer: Commercial Managed Care - HMO | Source: Ambulatory Visit | Attending: Cardiology

## 2014-11-26 ENCOUNTER — Inpatient Hospital Stay (HOSPITAL_COMMUNITY)
Admission: AD | Admit: 2014-11-26 | Discharge: 2014-11-27 | DRG: 287 | Disposition: A | Discharge status: Home / Self Care | Payer: Commercial Managed Care - HMO | Source: Ambulatory Visit | Attending: Cardiology | Admitting: Cardiology

## 2014-11-26 ENCOUNTER — Observation Stay (HOSPITAL_COMMUNITY): Payer: Commercial Managed Care - HMO

## 2014-11-26 VITALS — BP 125/82 | HR 68 | Ht 70.0 in | Wt 223.0 lb

## 2014-11-26 DIAGNOSIS — Z8249 Family history of ischemic heart disease and other diseases of the circulatory system: Secondary | ICD-10-CM

## 2014-11-26 DIAGNOSIS — Z87891 Personal history of nicotine dependence: Secondary | ICD-10-CM | POA: Diagnosis not present

## 2014-11-26 DIAGNOSIS — G2581 Restless legs syndrome: Secondary | ICD-10-CM | POA: Diagnosis not present

## 2014-11-26 DIAGNOSIS — I25110 Atherosclerotic heart disease of native coronary artery with unstable angina pectoris: Principal | ICD-10-CM | POA: Diagnosis present

## 2014-11-26 DIAGNOSIS — Z823 Family history of stroke: Secondary | ICD-10-CM

## 2014-11-26 DIAGNOSIS — Z7982 Long term (current) use of aspirin: Secondary | ICD-10-CM

## 2014-11-26 DIAGNOSIS — Z91013 Allergy to seafood: Secondary | ICD-10-CM | POA: Diagnosis not present

## 2014-11-26 DIAGNOSIS — I25118 Atherosclerotic heart disease of native coronary artery with other forms of angina pectoris: Secondary | ICD-10-CM

## 2014-11-26 DIAGNOSIS — I10 Essential (primary) hypertension: Secondary | ICD-10-CM | POA: Diagnosis not present

## 2014-11-26 DIAGNOSIS — R0600 Dyspnea, unspecified: Secondary | ICD-10-CM | POA: Diagnosis present

## 2014-11-26 DIAGNOSIS — M25561 Pain in right knee: Secondary | ICD-10-CM | POA: Diagnosis not present

## 2014-11-26 DIAGNOSIS — R079 Chest pain, unspecified: Secondary | ICD-10-CM | POA: Diagnosis not present

## 2014-11-26 DIAGNOSIS — E785 Hyperlipidemia, unspecified: Secondary | ICD-10-CM | POA: Diagnosis present

## 2014-11-26 DIAGNOSIS — I200 Unstable angina: Secondary | ICD-10-CM | POA: Diagnosis not present

## 2014-11-26 DIAGNOSIS — Z955 Presence of coronary angioplasty implant and graft: Secondary | ICD-10-CM

## 2014-11-26 DIAGNOSIS — R0602 Shortness of breath: Secondary | ICD-10-CM | POA: Diagnosis not present

## 2014-11-26 DIAGNOSIS — Z881 Allergy status to other antibiotic agents status: Secondary | ICD-10-CM | POA: Diagnosis not present

## 2014-11-26 DIAGNOSIS — I2510 Atherosclerotic heart disease of native coronary artery without angina pectoris: Secondary | ICD-10-CM

## 2014-11-26 DIAGNOSIS — G4733 Obstructive sleep apnea (adult) (pediatric): Secondary | ICD-10-CM | POA: Diagnosis present

## 2014-11-26 DIAGNOSIS — K219 Gastro-esophageal reflux disease without esophagitis: Secondary | ICD-10-CM | POA: Diagnosis not present

## 2014-11-26 DIAGNOSIS — M25562 Pain in left knee: Secondary | ICD-10-CM | POA: Diagnosis not present

## 2014-11-26 DIAGNOSIS — I252 Old myocardial infarction: Secondary | ICD-10-CM

## 2014-11-26 DIAGNOSIS — Z79899 Other long term (current) drug therapy: Secondary | ICD-10-CM | POA: Diagnosis not present

## 2014-11-26 DIAGNOSIS — Z888 Allergy status to other drugs, medicaments and biological substances status: Secondary | ICD-10-CM

## 2014-11-26 HISTORY — PX: CARDIAC CATHETERIZATION: SHX172

## 2014-11-26 LAB — CBC WITH DIFFERENTIAL/PLATELET
Basophils Absolute: 0.1 10*3/uL (ref 0.0–0.1)
Basophils Relative: 1 % (ref 0–1)
EOS PCT: 4 % (ref 0–5)
Eosinophils Absolute: 0.5 10*3/uL (ref 0.0–0.7)
HCT: 42.7 % (ref 39.0–52.0)
Hemoglobin: 14.3 g/dL (ref 13.0–17.0)
LYMPHS PCT: 29 % (ref 12–46)
Lymphs Abs: 3.2 10*3/uL (ref 0.7–4.0)
MCH: 29.7 pg (ref 26.0–34.0)
MCHC: 33.5 g/dL (ref 30.0–36.0)
MCV: 88.8 fL (ref 78.0–100.0)
MONO ABS: 1.2 10*3/uL — AB (ref 0.1–1.0)
MONOS PCT: 11 % (ref 3–12)
Neutro Abs: 6.2 10*3/uL (ref 1.7–7.7)
Neutrophils Relative %: 55 % (ref 43–77)
Platelets: 335 10*3/uL (ref 150–400)
RBC: 4.81 MIL/uL (ref 4.22–5.81)
RDW: 14.7 % (ref 11.5–15.5)
WBC: 11.1 10*3/uL — ABNORMAL HIGH (ref 4.0–10.5)

## 2014-11-26 LAB — COMPREHENSIVE METABOLIC PANEL
ALK PHOS: 59 U/L (ref 38–126)
ALT: 20 U/L (ref 17–63)
AST: 20 U/L (ref 15–41)
Albumin: 3.8 g/dL (ref 3.5–5.0)
Anion gap: 9 (ref 5–15)
BUN: 17 mg/dL (ref 6–20)
CHLORIDE: 105 mmol/L (ref 101–111)
CO2: 24 mmol/L (ref 22–32)
Calcium: 9.2 mg/dL (ref 8.9–10.3)
Creatinine, Ser: 1.11 mg/dL (ref 0.61–1.24)
GFR calc Af Amer: >60 mL/min (ref >60)
GFR calc non Af Amer: >60 mL/min (ref >60)
Glucose, Bld: 103 mg/dL — ABNORMAL HIGH (ref 70–99)
Potassium: 4 mmol/L (ref 3.5–5.1)
SODIUM: 138 mmol/L (ref 135–145)
Total Bilirubin: 0.8 mg/dL (ref 0.3–1.2)
Total Protein: 7 g/dL (ref 6.5–8.1)

## 2014-11-26 LAB — BRAIN NATRIURETIC PEPTIDE: B Natriuretic Peptide: 21.3 pg/mL (ref 0.0–100.0)

## 2014-11-26 LAB — PROTIME-INR
INR: 1.07 (ref 0.00–1.49)
PROTHROMBIN TIME: 14 s (ref 11.6–15.2)

## 2014-11-26 LAB — TROPONIN I: Troponin I: <0.03 ng/mL (ref <0.031)

## 2014-11-26 LAB — APTT: APTT: 36 s (ref 24–37)

## 2014-11-26 SURGERY — LEFT HEART CATH AND CORONARY ANGIOGRAPHY
Anesthesia: LOCAL

## 2014-11-26 MED ORDER — CLOPIDOGREL BISULFATE 75 MG PO TABS
75.0000 mg | ORAL_TABLET | Freq: Every day | ORAL | Status: DC
  Administered 2014-11-27: 75 mg via ORAL
  Filled 2014-11-26: qty 1

## 2014-11-26 MED ORDER — ASPIRIN 81 MG PO CHEW: 81.0000 mg | CHEWABLE_TABLET | ORAL | Status: DC

## 2014-11-26 MED ORDER — SODIUM CHLORIDE 0.9 % IJ SOLN: 3.0000 mL | INTRAMUSCULAR | Status: DC | PRN

## 2014-11-26 MED ORDER — CLOPIDOGREL BISULFATE 300 MG PO TABS
600.0000 mg | ORAL_TABLET | Freq: Once | ORAL | Status: AC
  Administered 2014-11-26: 600 mg via ORAL
  Filled 2014-11-26: qty 8
  Filled 2014-11-26: qty 2

## 2014-11-26 MED ORDER — FUROSEMIDE 40 MG PO TABS
40.0000 mg | ORAL_TABLET | Freq: Every day | ORAL | Status: DC
  Administered 2014-11-27: 40 mg via ORAL
  Filled 2014-11-26: qty 1

## 2014-11-26 MED ORDER — FENTANYL CITRATE (PF) 100 MCG/2ML IJ SOLN
INTRAMUSCULAR | Status: DC | PRN
  Administered 2014-11-26: 50 ug via INTRAVENOUS

## 2014-11-26 MED ORDER — CYCLOBENZAPRINE HCL 10 MG PO TABS: 10.0000 mg | ORAL_TABLET | Freq: Three times a day (TID) | ORAL | Status: DC | PRN

## 2014-11-26 MED ORDER — IOHEXOL 350 MG/ML SOLN
INTRAVENOUS | Status: DC | PRN
  Administered 2014-11-26: 90 mL via INTRACARDIAC

## 2014-11-26 MED ORDER — HEPARIN SODIUM (PORCINE) 1000 UNIT/ML IJ SOLN
INTRAMUSCULAR | Status: AC
  Filled 2014-11-26: qty 1

## 2014-11-26 MED ORDER — FLUTICASONE PROPIONATE 50 MCG/ACT NA SUSP
2.0000 | Freq: Every day | NASAL | Status: DC
  Administered 2014-11-27: 2 via NASAL
  Filled 2014-11-26: qty 16

## 2014-11-26 MED ORDER — SODIUM CHLORIDE 0.9 % IV SOLN: 250.0000 mL | INTRAVENOUS | Status: DC | PRN

## 2014-11-26 MED ORDER — HYDROCODONE-ACETAMINOPHEN 5-325 MG PO TABS
1.0000 | ORAL_TABLET | Freq: Four times a day (QID) | ORAL | Status: DC | PRN
  Administered 2014-11-26: 1 via ORAL
  Filled 2014-11-26: qty 1

## 2014-11-26 MED ORDER — NITROGLYCERIN 0.4 MG SL SUBL: 0.4000 mg | SUBLINGUAL_TABLET | SUBLINGUAL | Status: DC | PRN

## 2014-11-26 MED ORDER — VERAPAMIL HCL 2.5 MG/ML IV SOLN
INTRAVENOUS | Status: AC
  Filled 2014-11-26: qty 2

## 2014-11-26 MED ORDER — MIDAZOLAM HCL 2 MG/2ML IJ SOLN
INTRAMUSCULAR | Status: DC | PRN
  Administered 2014-11-26: 2 mg via INTRAVENOUS

## 2014-11-26 MED ORDER — HEPARIN BOLUS VIA INFUSION
4000.0000 [IU] | Freq: Once | INTRAVENOUS | Status: AC
  Administered 2014-11-26: 4000 [IU] via INTRAVENOUS
  Filled 2014-11-26: qty 4000

## 2014-11-26 MED ORDER — SODIUM CHLORIDE 0.9 % WEIGHT BASED INFUSION: 3.0000 mL/kg/h | INTRAVENOUS | Status: DC

## 2014-11-26 MED ORDER — VERAPAMIL HCL 2.5 MG/ML IV SOLN
INTRAVENOUS | Status: DC | PRN
  Administered 2014-11-26: 15:00:00 via INTRA_ARTERIAL

## 2014-11-26 MED ORDER — ROPINIROLE HCL 1 MG PO TABS
5.0000 mg | ORAL_TABLET | Freq: Every day | ORAL | Status: DC
  Administered 2014-11-26: 5 mg via ORAL
  Filled 2014-11-26: qty 5

## 2014-11-26 MED ORDER — DICLOFENAC SODIUM 1 % TD GEL
1.0000 "application " | Freq: Every day | TRANSDERMAL | Status: DC | PRN
  Filled 2014-11-26: qty 100

## 2014-11-26 MED ORDER — HEPARIN (PORCINE) IN NACL 100-0.45 UNIT/ML-% IJ SOLN
1400.0000 [IU]/h | INTRAMUSCULAR | Status: DC
  Administered 2014-11-26: 1400 [IU]/h via INTRAVENOUS
  Filled 2014-11-26: qty 250

## 2014-11-26 MED ORDER — LIDOCAINE HCL (PF) 1 % IJ SOLN
INTRAMUSCULAR | Status: AC
  Filled 2014-11-26: qty 30

## 2014-11-26 MED ORDER — SODIUM CHLORIDE 0.9 % IJ SOLN: 3.0000 mL | Freq: Two times a day (BID) | INTRAMUSCULAR | Status: DC

## 2014-11-26 MED ORDER — ASPIRIN 81 MG PO CHEW
81.0000 mg | CHEWABLE_TABLET | Freq: Every day | ORAL | Status: DC
  Administered 2014-11-27: 81 mg via ORAL
  Filled 2014-11-26: qty 1

## 2014-11-26 MED ORDER — TICAGRELOR 90 MG PO TABS: 90.0000 mg | ORAL_TABLET | Freq: Two times a day (BID) | ORAL | Status: DC

## 2014-11-26 MED ORDER — SODIUM CHLORIDE 0.9 % IJ SOLN
3.0000 mL | Freq: Two times a day (BID) | INTRAMUSCULAR | Status: DC
  Administered 2014-11-26 (×2): 3 mL via INTRAVENOUS

## 2014-11-26 MED ORDER — ONDANSETRON HCL 4 MG/2ML IJ SOLN: 4.0000 mg | Freq: Four times a day (QID) | INTRAMUSCULAR | Status: DC | PRN

## 2014-11-26 MED ORDER — NITROGLYCERIN IN D5W 200-5 MCG/ML-% IV SOLN
3.0000 ug/min | INTRAVENOUS | Status: DC
  Administered 2014-11-26: 3 ug/min via INTRAVENOUS
  Filled 2014-11-26: qty 250

## 2014-11-26 MED ORDER — HEPARIN SODIUM (PORCINE) 1000 UNIT/ML IJ SOLN
INTRAMUSCULAR | Status: DC | PRN
  Administered 2014-11-26: 5000 [IU] via INTRAVENOUS

## 2014-11-26 MED ORDER — MIDAZOLAM HCL 2 MG/2ML IJ SOLN
INTRAMUSCULAR | Status: AC
  Filled 2014-11-26: qty 2

## 2014-11-26 MED ORDER — FENTANYL CITRATE (PF) 100 MCG/2ML IJ SOLN
INTRAMUSCULAR | Status: AC
  Filled 2014-11-26: qty 2

## 2014-11-26 MED ORDER — LEVOTHYROXINE SODIUM 75 MCG PO TABS
150.0000 ug | ORAL_TABLET | Freq: Every day | ORAL | Status: DC
  Administered 2014-11-27: 150 ug via ORAL
  Filled 2014-11-26: qty 2

## 2014-11-26 MED ORDER — HEPARIN (PORCINE) IN NACL 2-0.9 UNIT/ML-% IJ SOLN
INTRAMUSCULAR | Status: AC
  Filled 2014-11-26: qty 1500

## 2014-11-26 MED ORDER — SODIUM CHLORIDE 0.9 % WEIGHT BASED INFUSION
1.0000 mL/kg/h | INTRAVENOUS | Status: DC
  Administered 2014-11-26: 1 mL/kg/h via INTRAVENOUS

## 2014-11-26 MED ORDER — PANTOPRAZOLE SODIUM 40 MG PO TBEC
40.0000 mg | DELAYED_RELEASE_TABLET | Freq: Every day | ORAL | Status: DC
  Administered 2014-11-27: 40 mg via ORAL
  Filled 2014-11-26: qty 1

## 2014-11-26 MED ORDER — AMLODIPINE BESYLATE 5 MG PO TABS
5.0000 mg | ORAL_TABLET | Freq: Every day | ORAL | Status: DC
  Administered 2014-11-27: 5 mg via ORAL
  Filled 2014-11-26: qty 1

## 2014-11-26 MED ORDER — SODIUM CHLORIDE 0.9 % IV SOLN
INTRAVENOUS | Status: AC
  Administered 2014-11-26: 16:00:00 via INTRAVENOUS

## 2014-11-26 MED ORDER — CLONAZEPAM 0.5 MG PO TABS: 0.5000 mg | ORAL_TABLET | Freq: Three times a day (TID) | ORAL | Status: DC | PRN

## 2014-11-26 MED ORDER — ACETAMINOPHEN 325 MG PO TABS: 650.0000 mg | ORAL_TABLET | ORAL | Status: DC | PRN

## 2014-11-26 MED ORDER — SODIUM CHLORIDE 0.9 % IJ SOLN
3.0000 mL | Freq: Two times a day (BID) | INTRAMUSCULAR | Status: DC
  Administered 2014-11-26 – 2014-11-27 (×2): 3 mL via INTRAVENOUS

## 2014-11-26 SURGICAL SUPPLY — 13 items
CATH INFINITI 5 FR JL3.5 (CATHETERS) ×2
CATH INFINITI 5FR ANG PIGTAIL (CATHETERS) ×2
CATH INFINITI 5FR MULTPACK ANG (CATHETERS)
CATH INFINITI JR4 5F (CATHETERS) ×2
GLIDESHEATH SLEND SS 6F .021 (SHEATH) ×2
KIT HEART LEFT (KITS) ×2
PACK CARDIAC CATHETERIZATION (CUSTOM PROCEDURE TRAY) ×2
SHEATH PINNACLE 5F 10CM (SHEATH)
SYR MEDRAD MARK V 150ML (SYRINGE) ×2
TRANSDUCER W/STOPCOCK (MISCELLANEOUS) ×2
TUBING CIL FLEX 10 FLL-RA (TUBING) ×2
WIRE EMERALD 3MM-J .035X150CM (WIRE)
WIRE SAFE-T 1.5MM-J .035X260CM (WIRE) ×2

## 2014-11-26 NOTE — Progress Notes (Signed)
Removed TR Band off right wrist. Old dried blood under it, cleaned old blood off and applied gauze and occlusive dressing

## 2014-11-26 NOTE — Progress Notes (Signed)
Patient had brief run of Sinus Brady rate in 40's.  Nell Range, PA notified.  No new orders recieved

## 2014-11-26 NOTE — Progress Notes (Signed)
ANTICOAGULATION CONSULT NOTE - Initial Consult  Pharmacy Consult:  Heparin Indication: chest pain/ACS  Allergies  Allergen Reactions  . Metoprolol Tartrate     Sever chest pains " flat lined patient"  . Shellfish Allergy Anaphylaxis  . Atorvastatin     myalgias  . Lipitor [Atorvastatin Calcium]     myalgias  . Simvastatin     myalgias  . Trazodone And Nefazodone     Unsteady on feet  . Zolpidem     Chest pain  . Amoxicillin-Pot Clavulanate Rash    States recently prescribed and had no reaction when taken    Patient Measurements: Height = 70 inches Weight = 101.2 kg Heparin Dosing Weight: 94 kg  Vital Signs: Temp: 97.7 F (36.5 C) (05/06 0951) Temp Source: Oral (05/06 0951) BP: 131/69 mmHg (05/06 0951) Pulse Rate: 63 (05/06 0951)  Labs: No results for input(s): HGB, HCT, PLT, APTT, LABPROT, INR, HEPARINUNFRC, CREATININE, CKTOTAL, CKMB, TROPONINI in the last 72 hours.  Estimated Creatinine Clearance: 77.7 mL/min (by C-G formula based on Cr of 1.01).   Medical History: Past Medical History  Diagnosis Date  . Anemia   . Hyperlipidemia   . Hypertension   . Allergy   . Myocardial infarction   . GERD (gastroesophageal reflux disease)   . Restless leg syndrome   . Hypothyroidism   . Headache(784.0)   . Obstructive sleep apnea     pt refused  . CAD (coronary artery disease)     a. Nonobst disease by cath 2009. b. Cath 08/2012: moderate nonobstructive CAD - 50% prox LAD, 30% mLAD, 30% OM, 40% mRCA, EF 50-55%.  . Gastritis 2011  . Bronchitis   . Back pain     low  . Diverticulosis   . Adenomatous polyp of colon 2007  . AVM (arteriovenous malformation) 2011    a. S/p argon plasma coagulation and ablation in 2011.  . Statin intolerance   . Bradycardia     a. H/o almost 7sec pause nocturnally during 2011 admission. Also has h/o fatigue with BB.      Assessment: 38 YOM recently admitted with [place] and underwent LHC/PCI with DES to RPDA and LAD.  He was  readmitted today 11/26/14 with recurrent angina.  Pharmacy consulted to initiate IV heparin for ACS.  Today's lab is pending collection.  Labs from previous admission reviewed.  Noted patient was previously sub-therapeutic on heparin 1200 units/hr.   Goal of Therapy:  Heparin level 0.3-0.7 units/ml Monitor platelets by anticoagulation protocol: Yes    Plan:  - Heparin 4000 units IV bolus x 1, then - Heparin gtt at 1400 units/hr - Check 8 hr HL - Daily HL / CBC - F/U med hx   Kerrington Greenhalgh D. Mina Marble, PharmD, BCPS Pager:  [phone] 11/26/2014, 10:46 AM

## 2014-11-26 NOTE — Plan of Care (Signed)
Problem: Phase I Progression Outcomes Goal: Aspirin unless contraindicated Outcome: Completed/Met Date Met:  11/26/14 Patient took pta

## 2014-11-26 NOTE — Patient Instructions (Signed)
YOU ARE BEEN ADMITTED TO [place] ; YOU WILL BE ON UNIT 3 WEST

## 2014-11-26 NOTE — Progress Notes (Signed)
UR completed 

## 2014-11-26 NOTE — Interval H&P Note (Signed)
History and Physical Interval Note:  11/26/2014 2:34 PM  [name]  has presented today for cardiac cath with the diagnosis of dyspnea and chest pain c/w unstable angina. The various methods of treatment have been discussed with the patient and family. After consideration of risks, benefits and other options for treatment, the patient has consented to  Procedure(s): Left Heart Cath and Coronary Angiography (N/A) as a surgical intervention .  The patient's history has been reviewed, patient examined, no change in status, stable for surgery.  I have reviewed the patient's chart and labs.  Questions were answered to the patient's satisfaction.    Cath Lab Visit (complete for each Cath Lab visit)  Clinical Evaluation Leading to the Procedure:   ACS: No.  Non-ACS:    Anginal Classification: CCS III  Anti-ischemic medical therapy: Minimal Therapy (1 class of medications)  Non-Invasive Test Results: No non-invasive testing performed  Prior CABG: No previous CABG         Sabriah Hobbins

## 2014-11-26 NOTE — H&P (Signed)
History and Physical   Date:  11/26/2014   ID:  [name], DOB 09/24/1940, MRN [id number]  PCP:  [name], MD  Cardiologist:  Dr. [name]     Chief Complaint  Patient presents with  . Coronary Artery Disease     History of Present Illness: [name] is a 74 y.o. male with a hx of CAD, HTN, HL, OSA, bradycardia.  Previously followed by Dr. [name].  Prior cardiac cath in 2009 demonstrated non-obstructive CAD.  Last seen by Dr. [name] 09/2012.    Admitted 4/25-4/27 unstable angina.  CEs remained neg. LHC demonstrated severe RPDA disease and borderline mid LAD disease.  FFR of mid LAD disease demonstrated hemodynamic significance.  He underwent PCI with Xience DES to RPDA and LAD.  He returns for FU.    He felt well the day he went home from the hospital.  However, 1-2 days later, he started having substernal heaviness with assoc dyspnea with any type of activity (CCS class 3).  He denies rest pain however, he is having "minor" discomfort in the office today.  He denies syncope. Has noted near syncope.  He denies orthopnea.  He has noted PND.  He has chronic LE edema without change.  Weights are stable at home.  No cough or wheeze.  He was on his way to the ED last week and took a NTG.  His pain resolved and he went home.  He had to take 2 NTG one day earlier this week.    Studies/Reports Reviewed Today:  Echo 11/06/14 - Mild concentric hypertrophy. EF 55% to 60%. Wall motion was normal; Grade 1 diastolic dysfunction.  - Tricuspid valve: There was trivial regurgitation. - Pericardium, extracardiac: There was no pericardial effusion.  LHC/PCI 11/16/14 EF 55-60% LM:  OK LAD:  prox 50% then 60-80% and 80% beyond D2; D1 50% LCx: dist 20-30% RCA:  20-30%; RPDA mid 95% PCI:  Xience Alpine 2.25 x 15 mm DES to mid RPDA; FFR guided Xience Alpine 2.75 x 33 mm DES to mid LAD  Past Medical History  Diagnosis Date  . Anemia   . Hyperlipidemia   .  Hypertension   . Allergy   . Myocardial infarction   . GERD (gastroesophageal reflux disease)   . Restless leg syndrome   . Hypothyroidism   . Headache(784.0)   . Obstructive sleep apnea     pt refused  . CAD (coronary artery disease)     a. Nonobst disease by cath 2009. b. Cath 08/2012: moderate nonobstructive CAD - 50% prox LAD, 30% mLAD, 30% OM, 40% mRCA, EF 50-55%.  . Gastritis 2011  . Bronchitis   . Back pain     low  . Diverticulosis   . Adenomatous polyp of colon 2007  . AVM (arteriovenous malformation) 2011    a. S/p argon plasma coagulation and ablation in 2011.  . Statin intolerance   . Bradycardia     a. H/o almost 7sec pause nocturnally during 2011 admission. Also has h/o fatigue with BB.    Past Surgical History  Procedure Laterality Date  . Appendectomy    . Hernia repair    . Tonsillectomy    . Ankle surgery Left   . Colonoscopy  08-23-05    per Dr. Deatra Ina, adenomatous polyps, repeat in 5 yrs   . Esophagogastroduodenoscopy  08-23-05    per Dr. Deatra Ina, cauterized jejunal AVMs   . Skin graft Right     leg  .  Left heart catheterization with coronary angiogram N/A 09/08/2012    Procedure: LEFT HEART CATHETERIZATION WITH CORONARY ANGIOGRAM;  Surgeon: Burnell Blanks, MD;  Location: [facility] CATH LAB;  Service: Cardiovascular;  Laterality: N/A;  . Left heart catheterization with coronary angiogram N/A 11/16/2014    Procedure: LEFT HEART CATHETERIZATION WITH CORONARY ANGIOGRAM;  Surgeon: Leonie Man, MD;  Location: [facility] CATH LAB;  Service: Cardiovascular;  Laterality: N/A;     Current Outpatient Prescriptions  Medication Sig Dispense Refill  . amLODipine (NORVASC) 5 MG tablet TAKE 1 TABLET DAILY 90 tablet 3  . Ascorbic Acid (VITAMIN C PO) Take 1 tablet by mouth daily as needed (flu like symptopms).    [name] aspirin 81 MG tablet Take 81 mg by mouth daily.      . clonazePAM (KLONOPIN) 0.5 MG tablet TAKE 1 TABLET BY MOUTH 3 TIMES A DAY AS NEEDED (Patient taking  differently: Take 0.5 mg by mouth 3 (three) times daily as needed for anxiety (or as needed for sleep). ) 270 tablet 1  . cyclobenzaprine (FLEXERIL) 10 MG tablet TAKE 1 TABLET THREE TIMES DAILY AS NEEDED FOR MUSCLE SPASMS 90 tablet 0  . diclofenac sodium (VOLTAREN) 1 % GEL Apply 1 application topically 2 (two) times a week. 5 Tube 10  . fluticasone (FLONASE) 50 MCG/ACT nasal spray Place 2 sprays into both nostrils daily. 48 g 3  . furosemide (LASIX) 40 MG tablet TAKE 1 TABLET BY MOUTH EVERY DAY 30 tablet 11  . HYDROcodone-acetaminophen (NORCO/VICODIN) 5-325 MG per tablet TAKE 1 TABLET BY MOUTH EVERY 6 HOURS AS NEEDED FOR PAIN (Patient taking differently: TAKE 1 TABLET BY MOUTH EVERY 6 HOURS AS NEEDED FOR MILD PAIN) 120 tablet 0  . levothyroxine (SYNTHROID, LEVOTHROID) 150 MCG tablet TAKE 1 TABLET DAILY 90 tablet 3  . naproxen (NAPROSYN) 500 MG tablet Take 1 tablet (500 mg total) by mouth 2 (two) times daily with a meal. 15 tablet 0  . NITROSTAT 0.4 MG SL tablet PLACE 1 TABLET UNDER TONGUE EVERY 5 MINUTES AS NEEDED FOR CHEST PAIN 25 tablet 0  . omeprazole (PRILOSEC) 20 MG capsule TAKE 1 CAPSULE TWICE DAILY 180 capsule 3  . oxyCODONE-acetaminophen (PERCOCET) 10-325 MG per tablet Take 1 tablet by mouth every 6 (six) hours as needed for pain. (Patient taking differently: Take 1 tablet by mouth every 6 (six) hours as needed (moderate pain). ) 120 tablet 0  . ropinirole (REQUIP) 5 MG tablet TAKE 1 TABLET AT BEDTIME 90 tablet 3  . ticagrelor (BRILINTA) 90 MG TABS tablet Take 1 tablet (90 mg total) by mouth 2 (two) times daily. 60 tablet 11   No current facility-administered medications for this visit.    Allergies:   Metoprolol tartrate; Shellfish allergy; Atorvastatin; Lipitor; Simvastatin; Trazodone and nefazodone; Zolpidem; and Amoxicillin-pot clavulanate    Social History:  The patient  reports that he quit smoking about 12 years ago. His smoking use included Cigarettes. He has a 100 pack-year  smoking history. He has never used smokeless tobacco. He reports that he does not drink alcohol or use illicit drugs.   Family History:  The patient's family history includes Alcoholism in his maternal grandfather and paternal uncle; Heart attack in his father; Heart disease in his father; Leukemia in his mother; Stroke in his father.    ROS:   Please see the history of present illness.   Review of Systems  Cardiovascular: Positive for chest pain, dyspnea on exertion, orthopnea and paroxysmal nocturnal dyspnea.  All other systems  reviewed and are negative.     PHYSICAL EXAM: VS:  BP 125/82 mmHg  Pulse 68  Ht 5\' 10"  (1.778 m)  Wt 223 lb (101.152 kg)  BMI 32.00 kg/m2    Wt Readings from Last 3 Encounters:  11/26/14 223 lb (101.152 kg)  11/17/14 224 lb 6.9 oz (101.8 kg)  08/19/14 230 lb (104.327 kg)     GEN: Well nourished, well developed, in no acute distress HEENT: normal Neck: no JVD, no carotid bruits, no masses Cardiac:  Normal S1/S2, RRR; no murmur ,  no rubs or gallops, no edema ; right wrist without hematoma or mass  Respiratory:  Crackles in bases bilaterally, no wheezing, rhonchi  GI: soft, nontender, nondistended, + BS MS: no deformity or atrophy Skin: warm and dry  Neuro:  CNs II-XII intact, Strength and sensation are intact Psych: Normal affect   EKG:  EKG is ordered today.  It demonstrates:   NSR, HR 71, nonspecific ST-T wave changes, no change from prior tracing   Recent Labs: 11/15/2014: ALT 20; B Natriuretic Peptide 23.5; TSH 0.277* 11/16/2014: Hemoglobin 13.5; Platelets 257 11/17/2014: BUN 10; Creatinine 1.01; Potassium 3.9; Sodium 135    Lipid Panel    Component Value Date/Time   CHOL 164 11/16/2014 0420   TRIG 212* 11/16/2014 0420   HDL 36* 11/16/2014 0420   CHOLHDL 4.6 11/16/2014 0420   VLDL 42* 11/16/2014 0420   LDLCALC 86 11/16/2014 0420      ASSESSMENT AND PLAN:  Coronary artery disease involving native coronary artery of native heart  with other form of angina pectoris The patient has had recurrent symptoms of angina since discharge from the hospital. The symptoms are nitroglycerin responsive. ECG is unchanged. I have recommended admission to the hospital today for relook cardiac catheterization. I discussed this with Dr. Ron Parker (DOD). He agrees. He also saw the patient. We have encouraged the patient to go by ambulance but he prefers to have a family member come pick him up.  We'll plan on starting IV heparin, obtain chest x-ray and BNP as well as serial cardiac markers. We will try to arrange cardiac catheterization today. Risks and benefits of cardiac catheterization have been discussed with the patient.  These include bleeding, infection, kidney damage, stroke, heart attack, death.  The patient understands these risks and is willing to proceed.   Essential hypertension Controlled.   Hyperlipidemia  He is intol of statins.    Current medicines are reviewed at length with the patient today.  Concerns regarding medicines are as outlined above.  The following changes have been made:    None    Labs/ tests ordered today include:   Orders Placed This Encounter  Procedures  . EKG 12-Lead    Disposition:   Admit to telemetry at [facility] today.   Signed, Versie Starks, MHS 11/26/2014 8:39 AM    [facility] [place], [place], [place]  [address] Phone: [phone]; Fax: [phone]  Patient seen and examined. I agree with the assessment and plan as detailed above. See also my additional thoughts below.   I have reviewed all of the information carefully with [name]. I spoke with the patient in person. He has been having significant exertional chest discomfort for more than several weeks. He did have some today. His EKG reveals no significant change. He mentioned that he might have slight residual discomfort here this morning. We have assessed this very carefully. We have  suggested  that he be transported by ambulance. He prefers to not go by ambulance. He has arranged for family to come pick him up at the office and take him to the hospital. All arrangements have been carefully made for his hospital admission. Heart team will see him in the hospital to take over the care.  At that point, the timing of cardiac catheterization can be finalized.  Dola Argyle, MD, [facility] 11/26/2014 8:44 AM

## 2014-11-27 ENCOUNTER — Encounter (HOSPITAL_COMMUNITY): Payer: Self-pay | Admitting: Emergency Medicine

## 2014-11-27 ENCOUNTER — Emergency Department (HOSPITAL_COMMUNITY)
Admission: EM | Admit: 2014-11-27 | Discharge: 2014-11-27 | Discharge status: Against Medical Advice | Payer: Commercial Managed Care - HMO | Attending: Emergency Medicine | Admitting: Emergency Medicine

## 2014-11-27 ENCOUNTER — Telehealth: Payer: Self-pay | Admitting: Internal Medicine

## 2014-11-27 DIAGNOSIS — I2510 Atherosclerotic heart disease of native coronary artery without angina pectoris: Secondary | ICD-10-CM | POA: Diagnosis not present

## 2014-11-27 DIAGNOSIS — M25561 Pain in right knee: Secondary | ICD-10-CM | POA: Insufficient documentation

## 2014-11-27 DIAGNOSIS — I10 Essential (primary) hypertension: Secondary | ICD-10-CM | POA: Insufficient documentation

## 2014-11-27 DIAGNOSIS — I252 Old myocardial infarction: Secondary | ICD-10-CM | POA: Insufficient documentation

## 2014-11-27 DIAGNOSIS — R0600 Dyspnea, unspecified: Secondary | ICD-10-CM | POA: Diagnosis present

## 2014-11-27 DIAGNOSIS — M25562 Pain in left knee: Secondary | ICD-10-CM | POA: Diagnosis not present

## 2014-11-27 DIAGNOSIS — R079 Chest pain, unspecified: Secondary | ICD-10-CM | POA: Diagnosis present

## 2014-11-27 LAB — CBC
HEMATOCRIT: 41.7 % (ref 39.0–52.0)
Hemoglobin: 13.5 g/dL (ref 13.0–17.0)
MCH: 29 pg (ref 26.0–34.0)
MCHC: 32.4 g/dL (ref 30.0–36.0)
MCV: 89.5 fL (ref 78.0–100.0)
PLATELETS: 354 10*3/uL (ref 150–400)
RBC: 4.66 MIL/uL (ref 4.22–5.81)
RDW: 14.7 % (ref 11.5–15.5)
WBC: 9.8 10*3/uL (ref 4.0–10.5)

## 2014-11-27 MED ORDER — HYDROCODONE-ACETAMINOPHEN 5-325 MG PO TABS: 1.0000 | ORAL_TABLET | Freq: Two times a day (BID) | ORAL | Status: DC | PRN

## 2014-11-27 MED ORDER — PANTOPRAZOLE SODIUM 40 MG PO TBEC: 40.0000 mg | DELAYED_RELEASE_TABLET | Freq: Every day | ORAL | Status: DC

## 2014-11-27 MED ORDER — DICLOFENAC SODIUM 1 % TD GEL: 1.0000 "application " | Freq: Every day | TRANSDERMAL | Status: AC | PRN

## 2014-11-27 MED ORDER — CLONAZEPAM 0.5 MG PO TABS: 0.5000 mg | ORAL_TABLET | Freq: Three times a day (TID) | ORAL | Status: DC | PRN

## 2014-11-27 MED ORDER — FLUTICASONE PROPIONATE 50 MCG/ACT NA SUSP: 1.0000 | Freq: Every day | NASAL | Status: DC

## 2014-11-27 MED ORDER — CLOPIDOGREL BISULFATE 75 MG PO TABS
75.0000 mg | ORAL_TABLET | Freq: Every day | ORAL | Status: DC
Start: 2014-11-27 — End: 2015-05-18

## 2014-11-27 MED ORDER — FUROSEMIDE 40 MG PO TABS: ORAL_TABLET | ORAL | Status: DC

## 2014-11-27 MED ORDER — OXYCODONE-ACETAMINOPHEN 10-325 MG PO TABS: 1.0000 | ORAL_TABLET | Freq: Every day | ORAL | Status: DC | PRN

## 2014-11-27 NOTE — Telephone Encounter (Signed)
Patient has called to [place] with complaints of bilateral leg pain. Initially patient was concerned about Plavix that he was switched to, he called the local pharmacy who recommended him not to take Plavix if he thought that that was a side effect area I have called him back. According to the patient he experienced sudden onset of pain in bilateral legs below the knee after 400 feet walk around the house. Pain was pressure-like, 9 out of 10 in intensity. Somewhat relieved by sitting on the edge of the bed. Patient reported no discoloration of his feet or toes, no swelling. Denied nausea, chest pain, shortness of breath, chills, fevers. Currently pain is mostly around his knees. Patient denied having that kind of leg pain before. Patient took a tablet of Vicodin with no relief about 20-30 minutes prior to the phone call.  Assessment:  I am not sure what could be causing the gentleman's bilateral lower extremity pain. Concern for cholesterol embolization ? aortic dissection?  Given acute onset of severe pain patient was recommended to go to the emergency department immediately for further evaluation. Vascular event is of concern.

## 2014-11-27 NOTE — ED Notes (Signed)
Pt sts he left this hospital this morning after a catheterization through his right wrist yesterday.  Pt sts he was started on Plavix, and was given a loading dose last night.  Pt sts after his dose last night he began to have pain "behing my kneecap" in both knees.  Pain is not worse or better with movement.  No pain with palpation.  No swelling noted.  Pt sts ibuprofen did not help pain last night, but his home narcotic pain medication and muscle relaxers have helped at this time.

## 2014-11-27 NOTE — Progress Notes (Signed)
Patient discharged today.  Patient called back to unit and stated that he was having knee and leg pain.  He was concerned it was because of the Plavix he was recently started on.  I took patient's phone number and paged Dr.  Ulyses Amor and asked him to speak with the patient.

## 2014-11-27 NOTE — Progress Notes (Signed)
Subjective:  Feels much better today.  Catheterization showed patent stents yesterday.  Wants to go home today.  Objective:  Vital Signs in the last 24 hours: BP 139/83 mmHg  Pulse 77  Temp(Src) 98.3 F (36.8 C) (Oral)  Resp 20  Wt 105.688 kg (233 lb)  SpO2 94%  Physical Exam: Pleasant male currently in no acute distress. Lungs:  Clear Cardiac:  Regular rhythm, normal S1 and S2, no S3 Abdomen:  Soft, nontender, no masses Extremities:  No edema present, radial catheterization site is now clean and dry  Intake/Output from previous day: 05/06 0701 - 05/07 0700 In: -  Out: 1025 [Urine:1025]  Weight Filed Weights   11/27/14 0514  Weight: 105.688 kg (233 lb)    Lab Results: Basic Metabolic Panel:  Recent Labs  11/26/14 1119  NA 138  K 4.0  CL 105  CO2 24  GLUCOSE 103*  BUN 17  CREATININE 1.11   CBC:  Recent Labs  11/26/14 1119 11/27/14 0605  WBC 11.1* 9.8  NEUTROABS 6.2  --   HGB 14.3 13.5  HCT 42.7 41.7  MCV 88.8 89.5  PLT 335 354   Cardiac Panel (last 3 results)  Recent Labs  11/26/14 1119  TROPONINI <0.03    Telemetry: Sinus with occasional sinus bradycardia with 2.5 second pause noted last night.  Assessment/Plan:  1.  Coronary artery disease with recent patent stents at catheterization yesterday 2.  Hyperlipidemia currently treated 3.  Obstructive sleep apnea  Recommendations:  Home later on today.      Kerry Hough  MD [facility]  11/27/2014, 8:22 AM

## 2014-11-27 NOTE — Progress Notes (Signed)
Spoke with Cards Fellow and reported pause in HR. No orders received

## 2014-11-27 NOTE — Progress Notes (Signed)
Pt had a brief run of Sinus [name] with a 2.53 second pause. Cards Fellow paged

## 2014-11-27 NOTE — ED Notes (Signed)
Pt. reports bilateral knee pain with no swelling onset today , denies injury or fall , no fever or chills.

## 2014-11-27 NOTE — ED Provider Notes (Cosign Needed)
MSE was initiated and I personally evaluated the patient and placed orders (if any) at  8:28 PM on Nov 27, 2014.   [name] is a 74 y.o. male who presents to the Emergency Department complaining of bilateral knee pain sudden onset today that he states is "behind his kneecaps". Pt states that he was seen at the hospital last night and had heart catheterization placed through his right wrist and started on Plavix (given a loading dose). Pt was discharged this morning and notes sudden onset severe pain behind knees has gotten worse throughout the day.   Given risk factors and comorbidities, patient may benefit from additional workup on the acute side.  The patient appears stable so that the remainder of the MSE may be completed by another provider.  Montine Circle, PA-C 11/27/14 2031

## 2014-11-27 NOTE — Discharge Summary (Signed)
CARDIOLOGY DISCHARGE SUMMARY   Patient ID: [name] MRN: [id number] DOB/AGE: February 22, 1941 74 y.o.  Admit date: 11/26/2014 Discharge date: 11/27/2014  PCP: [name], MD Primary Cardiologist: Dr [name]  Primary Discharge Diagnosis:  Chest pain, high risk of cardiac etiology Secondary Discharge Diagnosis:  Dyspnea  Procedures: Cardiac catheterization, coronary arteriogram, left ventriculogram  Hospital Course: [name] is a 74 y.o. male with a history of CAD, he had PCI with Xience DES to RPDA and LAD on 11/16/2014. He was seen in the office in follow-up on 11/26/2014. He was complaining of chest pain and shortness of breath. He had required sublingual nitroglycerin in the past week and was having consistent shortness of breath and chest pain with exertion. He was admitted for further evaluation and catheterization.  Cardiac catheterization results are below. Previously placed stents were patent and other lesions appeared unchanged. Medical therapy was recommended. There was concern that the Brilinta was a cause of his symptoms and he was changed to Plavix. Because he is on Plavix, his Prilosec was changed to Protonix.  On 11/27/2014, he was seen by Dr. Wynonia Lawman and all data were reviewed. He was feeling much better and having no further episodes of chest pain or shortness of breath. His labs were stable post cath. No further inpatient workup is indicated and he is considered stable for discharge, to follow up as an outpatient.  Labs:   Lab Results  Component Value Date   WBC 9.8 11/27/2014   HGB 13.5 11/27/2014   HCT 41.7 11/27/2014   MCV 89.5 11/27/2014   PLT 354 11/27/2014     Recent Labs Lab 11/26/14 1119  NA 138  K 4.0  CL 105  CO2 24  BUN 17  CREATININE 1.11  CALCIUM 9.2  PROT 7.0  BILITOT 0.8  ALKPHOS 59  ALT 20  AST 20  GLUCOSE 103*    Recent Labs  11/26/14 1119  TROPONINI <0.03   B NATRIURETIC PEPTIDE  Date/Time Value Ref Range  Status  11/26/2014 11:19 AM 21.3 0.0 - 100.0 pg/mL Final  11/15/2014 12:05 PM 23.5 0.0 - 100.0 pg/mL Final    Recent Labs  11/26/14 1119  INR 1.07      Radiology: X-ray Chest Pa And Lateral 11/26/2014   CLINICAL DATA:  Chest pain and shortness of breath today, history hypertension, hyperlipidemia, coronary artery disease post MI, obstructive sleep apnea, former smoker, GERD  EXAM: CHEST  2 VIEW  COMPARISON:  11/15/2014  FINDINGS: Enlargement of cardiac silhouette.  Mediastinal contours and pulmonary vascularity normal.  Minimal bibasilar atelectasis without infiltrate, pleural effusion or pneumothorax.  Mild scattered endplate spur formation thoracic spine.  No acute osseous findings.  IMPRESSION: Enlargement of cardiac silhouette with minimal bibasilar atelectasis.   Electronically Signed   By: Lavonia Dana M.D.   On: 11/26/2014 12:32   Cardiac Cath: 11/26/2014 Conclusion     Prox RCA lesion, 20% stenosed.  Mid RCA lesion, 40% stenosed.  Mid RCA to Dist RCA lesion, 30% stenosed.  A drug-eluting stent was placed.  Mid Cx lesion, 30% stenosed.  Ost LAD to Prox LAD lesion, 50% stenosed.  Dist LAD lesion, 25% stenosed.  1. Double vessel CAD with patent stents LAD and RCA 2. Moderate proximal LAD stenosis that was evaluated with a FFR 10 days ago and was not flow limiting, unchanged in appearance from cath in 2014 3. Normal LV systolic function 4. Possible dyspnea and chest pressure from Brilinta  Recommendations: Will change  Brilinta to Plavix. Continue other cardiac meds.    EKG: 11/27/2014 Sinus rhythm, PACs, no acute ischemic changes  FOLLOW UP PLANS AND APPOINTMENTS Allergies  Allergen Reactions  . Metoprolol Tartrate     Sever chest pains " flat lined patient"  . Shellfish Allergy Anaphylaxis  . Atorvastatin     myalgias  . Lipitor [Atorvastatin Calcium]     myalgias  . Simvastatin     myalgias  . Trazodone And Nefazodone     Unsteady on feet  . Zolpidem      Chest pain  . Amoxicillin-Pot Clavulanate Rash    States recently prescribed and had no reaction when taken     Medication List    STOP taking these medications        omeprazole 20 MG capsule  Commonly known as:  PRILOSEC  Replaced by:  pantoprazole 40 MG tablet     ticagrelor 90 MG Tabs tablet  Commonly known as:  BRILINTA      TAKE these medications        amLODipine 5 MG tablet  Commonly known as:  NORVASC  TAKE 1 TABLET DAILY     aspirin 81 MG tablet  Take 81 mg by mouth daily.     clonazePAM 0.5 MG tablet  Commonly known as:  KLONOPIN  Take 1 tablet (0.5 mg total) by mouth 3 (three) times daily as needed for anxiety (or as needed for sleep).     clopidogrel 75 MG tablet  Commonly known as:  PLAVIX  Take 1 tablet (75 mg total) by mouth daily.     cyclobenzaprine 10 MG tablet  Commonly known as:  FLEXERIL  TAKE 1 TABLET THREE TIMES DAILY AS NEEDED FOR MUSCLE SPASMS     diclofenac sodium 1 % Gel  Commonly known as:  VOLTAREN  Apply 1 application topically daily as needed (ankle pain).     fluticasone 50 MCG/ACT nasal spray  Commonly known as:  FLONASE  Place 1 spray into both nostrils daily.     furosemide 40 MG tablet  Commonly known as:  LASIX  TAKE 1 TABLET BY MOUTH EVERY DAY as needed.     HYDROcodone-acetaminophen 5-325 MG per tablet  Commonly known as:  NORCO/VICODIN  Take 1 tablet by mouth every 12 (twelve) hours as needed for moderate pain.     levothyroxine 150 MCG tablet  Commonly known as:  SYNTHROID, LEVOTHROID  TAKE 1 TABLET DAILY     MURINE FOR RED EYES OP  Place 1 drop into both eyes daily as needed (red eyes).     NITROSTAT 0.4 MG SL tablet  Generic drug:  nitroGLYCERIN  PLACE 1 TABLET UNDER TONGUE EVERY 5 MINUTES AS NEEDED FOR CHEST PAIN     oxyCODONE-acetaminophen 10-325 MG per tablet  Commonly known as:  PERCOCET  Take 1 tablet by mouth daily as needed (moderate pain).     pantoprazole 40 MG tablet  Commonly known as:   PROTONIX  Take 1 tablet (40 mg total) by mouth daily.     ropinirole 5 MG tablet  Commonly known as:  REQUIP  TAKE 1 TABLET AT BEDTIME     VITAMIN C PO  Take 1 tablet by mouth daily as needed (flu like symptopms).        Discharge Instructions    Diet - low sodium heart healthy    Complete by:  As directed      Increase activity slowly    Complete by:  As  directed           Follow-up Information    Follow up with [name], MD.   Specialty:  Cardiology   Why:  The office will call.   Contact information:   [address] [place] [address] [phone]       BRING ALL MEDICATIONS WITH YOU TO FOLLOW UP APPOINTMENTS  Time spent with patient to include physician time: 41 min Signed: Rosaria Ferries, PA-C 11/27/2014, 12:01 PM Co-Sign MD  Patient seen and examined.  Agree with above  W. Doristine Church MD [facility]

## 2014-11-27 NOTE — Discharge Instructions (Signed)
PLEASE REMEMBER TO BRING ALL OF YOUR MEDICATIONS TO EACH OF YOUR FOLLOW-UP OFFICE VISITS.  PLEASE ATTEND ALL SCHEDULED FOLLOW-UP APPOINTMENTS.   Activity: Increase activity slowly as tolerated. You may shower, but no soaking baths (or swimming) for 1 week. No driving for 2 days. No lifting over 5 lbs for 1 week. No sexual activity for 1 week.   You May Return to Work: in 1 week (if applicable)  Wound Care: You may wash cath site gently with soap and water. Keep cath site clean and dry. If you notice pain, swelling, bleeding or pus at your cath site, please call [phone].    Cardiac Cath Site Care Refer to this sheet in the next few weeks. These instructions provide you with information on caring for yourself after your procedure. Your caregiver may also give you more specific instructions. Your treatment has been planned according to current medical practices, but problems sometimes occur. Call your caregiver if you have any problems or questions after your procedure. HOME CARE INSTRUCTIONS  You may shower 24 hours after the procedure. Remove the bandage (dressing) and gently wash the site with plain soap and water. Gently pat the site dry.   Do not apply powder or lotion to the site.   Do not sit in a bathtub, swimming pool, or whirlpool for 5 to 7 days.   No bending, squatting, or lifting anything over 10 pounds (4.5 kg) as directed by your caregiver.   Inspect the site at least twice daily.   Do not drive home if you are discharged the same day of the procedure. Have someone else drive you.   You may drive 24 hours after the procedure unless otherwise instructed by your caregiver.  What to expect:  Any bruising will usually fade within 1 to 2 weeks.   Blood that collects in the tissue (hematoma) may be painful to the touch. It should usually decrease in size and tenderness within 1 to 2 weeks.  SEEK IMMEDIATE MEDICAL CARE IF:  You have unusual pain at the site or down the  affected limb.   You have redness, warmth, swelling, or pain at the site.   You have drainage (other than a small amount of blood on the dressing).   You have chills.   You have a fever or persistent symptoms for more than 72 hours.   You have a fever and your symptoms suddenly get worse.   Your leg becomes pale, cool, tingly, or numb.   You have heavy bleeding from the site. Hold pressure on the site.  Document Released: 08/11/2010 Document Revised: 06/28/2011 Document Reviewed: 08/11/2010 [facility] Patient Information 2012 [place].  Cardiac Diet This diet can help prevent heart disease and stroke. Many factors influence your heart health, including eating and exercise habits. Coronary risk rises a lot with abnormal blood fat (lipid) levels. Cardiac meal planning includes limiting unhealthy fats, increasing healthy fats, and making other small dietary changes. General guidelines are as follows:  Adjust calorie intake to reach and maintain desirable body weight.  Limit total fat intake to less than 30% of total calories. Saturated fat should be less than 7% of calories.  Saturated fats are found in animal products and in some vegetable products. Saturated vegetable fats are found in coconut oil, cocoa butter, palm oil, and palm kernel oil. Read labels carefully to avoid these products as much as possible. Use butter in moderation. Choose tub margarines and oils that have 2 grams of fat or less.  Good cooking oils are canola and olive oils.  Practice low-fat cooking techniques. Do not fry food. Instead, broil, bake, boil, steam, grill, roast on a rack, stir-fry, or microwave it. Other fat reducing suggestions include:  Remove the skin from poultry.  Remove all visible fat from meats.  Skim the fat off stews, soups, and gravies before serving them.  Steam vegetables in water or broth instead of sauting them in fat.  Avoid foods with trans fat (or hydrogenated oils), such as  commercially fried foods and commercially baked goods. Commercial shortening and deep-frying fats will contain trans fat.  Increase intake of fruits, vegetables, whole grains, and legumes to replace foods high in fat.  Increase consumption of nuts, legumes, and seeds to at least 4 servings weekly. One serving of a legume equals  cup, and 1 serving of nuts or seeds equals  cup.  Choose whole grains more often. Have 3 servings per day (a serving is 1 ounce [oz]).  Eat 4 to 5 servings of vegetables per day. A serving of vegetables is 1 cup of raw leafy vegetables;  cup of raw or cooked cut-up vegetables;  cup of vegetable juice.  Eat 4 to 5 servings of fruit per day. A serving of fruit is 1 medium whole fruit;  cup of dried fruit;  cup of fresh, frozen, or canned fruit;  cup of 100% fruit juice.  Increase your intake of dietary fiber to 20 to 30 grams per day. Insoluble fiber may help lower your risk of heart disease and may help curb your appetite.  Soluble fiber binds cholesterol to be removed from the blood. Foods high in soluble fiber are dried beans, citrus fruits, oats, apples, bananas, broccoli, Brussels sprouts, and eggplant.  Try to include foods fortified with plant sterols or stanols, such as yogurt, breads, juices, or margarines. Choose several fortified foods to achieve a daily intake of 2 to 3 grams of plant sterols or stanols.  Foods with omega-3 fats can help reduce your risk of heart disease. Aim to have a 3.5 oz portion of fatty fish twice per week, such as salmon, mackerel, albacore tuna, sardines, lake trout, or herring. If you wish to take a fish oil supplement, choose one that contains 1 gram of both DHA and EPA.  Limit processed meats to 2 servings (3 oz portion) weekly.  Limit the sodium in your diet to 1500 milligrams (mg) per day. If you have high blood pressure, talk to a registered dietitian about a DASH (Dietary Approaches to Stop Hypertension) eating  plan.  Limit sweets and beverages with added sugar, such as soda, to no more than 5 servings per week. One serving is:   1 tablespoon sugar.  1 tablespoon jelly or jam.   cup sorbet.  1 cup lemonade.   cup regular soda. CHOOSING FOODS Starches  Allowed: Breads: All kinds (wheat, rye, raisin, white, oatmeal, [place], [place], and English muffin bread). Low-fat rolls: English muffins, frankfurter and hamburger buns, bagels, pita bread, tortillas (not fried). Pancakes, waffles, biscuits, and muffins made with recommended oil.  Avoid: Products made with saturated or trans fats, oils, or whole milk products. Butter rolls, cheese breads, croissants. Commercial doughnuts, muffins, sweet rolls, biscuits, waffles, pancakes, store-bought mixes. Crackers  Allowed: Low-fat crackers and snacks: Animal, graham, rye, saltine (with recommended oil, no lard), oyster, and matzo crackers. Bread sticks, melba toast, rusks, flatbread, pretzels, and light popcorn.  Avoid: High-fat crackers: cheese crackers, butter crackers, and those made with coconut, palm oil,  or trans fat (hydrogenated oils). Buttered popcorn. Cereals  Allowed: Hot or cold whole-grain cereals.  Avoid: Cereals containing coconut, hydrogenated vegetable fat, or animal fat. Potatoes / Pasta / Rice  Allowed: All kinds of potatoes, rice, and pasta (such as macaroni, spaghetti, and noodles).  Avoid: Pasta or rice prepared with cream sauce or high-fat cheese. Chow mein noodles, [place] fries. Vegetables  Allowed: All vegetables and vegetable juices.  Avoid: Fried vegetables. Vegetables in cream, butter, or high-fat cheese sauces. Limit coconut. Fruit in cream or custard. Protein  Allowed: Limit your intake of meat, seafood, and poultry to no more than 6 oz (cooked weight) per day. All lean, well-trimmed beef, veal, pork, and lamb. All chicken and [place] without skin. All fish and shellfish. Wild game: wild duck, rabbit, pheasant, and  venison. Egg whites or low-cholesterol egg substitutes may be used as desired. Meatless dishes: recipes with dried beans, peas, lentils, and tofu (soybean curd). Seeds and nuts: all seeds and most nuts.  Avoid: Prime grade and other heavily marbled and fatty meats, such as short ribs, spare ribs, rib eye roast or steak, frankfurters, sausage, bacon, and high-fat luncheon meats, mutton. Caviar. Commercially fried fish. Domestic duck, goose, venison sausage. Organ meats: liver, gizzard, heart, chitterlings, brains, kidney, sweetbreads. Dairy  Allowed: Low-fat cheeses: nonfat or low-fat cottage cheese (1% or 2% fat), cheeses made with part skim milk, such as mozzarella, farmers, string, or ricotta. (Cheeses should be labeled no more than 2 to 6 grams fat per oz.). Skim (or 1%) milk: liquid, powdered, or evaporated. Buttermilk made with low-fat milk. Drinks made with skim or low-fat milk or cocoa. Chocolate milk or cocoa made with skim or low-fat (1%) milk. Nonfat or low-fat yogurt.  Avoid: Whole milk cheeses, including colby, cheddar, [place], Monterey Jack, [place], [place], [place], American, Swiss, and blue. Creamed cottage cheese, cream cheese. Whole milk and whole milk products, including buttermilk or yogurt made from whole milk, drinks made from whole milk. Condensed milk, evaporated whole milk, and 2% milk. Soups and Combination Foods  Allowed: Low-fat low-sodium soups: broth, dehydrated soups, homemade broth, soups with the fat removed, homemade cream soups made with skim or low-fat milk. Low-fat spaghetti, lasagna, chili, and Spanish rice if low-fat ingredients and low-fat cooking techniques are used.  Avoid: Cream soups made with whole milk, cream, or high-fat cheese. All other soups. Desserts and Sweets  Allowed: Sherbet, fruit ices, gelatins, meringues, and angel food cake. Homemade desserts with recommended fats, oils, and milk products. Jam, jelly, honey, marmalade, sugars, and syrups.  Pure sugar candy, such as gum drops, hard candy, jelly beans, marshmallows, mints, and small amounts of dark chocolate.  Avoid: Commercially prepared cakes, pies, cookies, frosting, pudding, or mixes for these products. Desserts containing whole milk products, chocolate, coconut, lard, palm oil, or palm kernel oil. Ice cream or ice cream drinks. Candy that contains chocolate, coconut, butter, hydrogenated fat, or unknown ingredients. Buttered syrups. Fats and Oils  Allowed: Vegetable oils: safflower, sunflower, corn, soybean, cottonseed, sesame, canola, olive, or peanut. Non-hydrogenated margarines. Salad dressing or mayonnaise: homemade or commercial, made with a recommended oil. Low or nonfat salad dressing or mayonnaise.  Limit added fats and oils to 6 to 8 tsp per day (includes fats used in cooking, baking, salads, and spreads on bread). Remember to count the "hidden fats" in foods.  Avoid: Solid fats and shortenings: butter, lard, salt pork, bacon drippings. Gravy containing meat fat, shortening, or suet. Cocoa butter, coconut. Coconut oil, palm oil, palm kernel oil,  or hydrogenated oils: these ingredients are often used in bakery products, nondairy creamers, whipped toppings, candy, and commercially fried foods. Read labels carefully. Salad dressings made of unknown oils, sour cream, or cheese, such as blue cheese and Roquefort. Cream, all kinds: half-and-half, light, heavy, or whipping. Sour cream or cream cheese (even if "light" or low-fat). Nondairy cream substitutes: coffee creamers and sour cream substitutes made with palm, palm kernel, hydrogenated oils, or coconut oil. Beverages  Allowed: Coffee (regular or decaffeinated), tea. Diet carbonated beverages, mineral water. Alcohol: Check with your caregiver. Moderation is recommended.  Avoid: Whole milk, regular sodas, and juice drinks with added sugar. Condiments  Allowed: All seasonings and condiments. Cocoa powder. "Cream" sauces made  with recommended ingredients.  Avoid: Carob powder made with hydrogenated fats. SAMPLE MENU Breakfast   cup orange juice   cup oatmeal  1 slice toast  1 tsp margarine  1 cup skim milk Lunch  [place] sandwich with 2 oz [place], 2 slices bread  Lettuce and tomato slices  Fresh fruit  Carrot sticks  Coffee or tea Snack  Fresh fruit or low-fat crackers Dinner  3 oz lean ground beef  1 baked potato  1 tsp margarine   cup asparagus  Lettuce salad  1 tbs non-creamy dressing   cup peach slices  1 cup skim milk Document Released: 04/17/2008 Document Revised: 01/08/2012 Document Reviewed: 09/08/2013 [employer] Patient Information 2015 [place], [place]. This information is not intended to replace advice given to you by your health care provider. Make sure you discuss any questions you have with your health care provider.

## 2014-11-29 ENCOUNTER — Telehealth: Payer: Self-pay | Admitting: Cardiovascular Disease

## 2014-11-29 ENCOUNTER — Encounter (HOSPITAL_COMMUNITY): Payer: Self-pay | Admitting: Cardiovascular Disease

## 2014-11-29 SURGERY — LEFT HEART CATH AND CORONARY ANGIOGRAPHY
Anesthesia: LOCAL

## 2014-11-29 MED FILL — Lidocaine HCl Local Preservative Free (PF) Inj 1%: INTRAMUSCULAR | Qty: 30 | Status: AC

## 2014-11-29 MED FILL — Heparin Sodium (Porcine) 2 Unit/ML in Sodium Chloride 0.9%: INTRAMUSCULAR | Qty: 1500 | Status: AC

## 2014-11-29 NOTE — Telephone Encounter (Signed)
Pt was seen in ED 5/7 for bilateral leg pain/ behind the knees. Pt feels it may be from Plavix. His restless leg syndrome worsened since starting plavix. Denies redness, no leg fever. Left leg has a dime sized knot 4 inches below the knee that is not red, just sore to the touch. Pt states he just cant take the discomfort and worsening leg syndrome. Could you recommend a substitute for Plavix for him? Pt aware dr/nurse out today and will address tomorrow. Pt verbalized understanding and was accepting of plan.

## 2014-11-29 NOTE — Telephone Encounter (Signed)
New message    Patient calling just release from hospital on saturday    Pt C/O medication issue:  1. Name of Medication: Plavix 75 mg   2. How are you currently taking this medication (dosage and times per day)? One time a day in am    3. Are you having a reaction (difficulty breathing--STAT)? Leg are hurting   4. What is your medication issue? Looking for alternative medication

## 2014-11-30 NOTE — Telephone Encounter (Signed)
I spoke to the pt and he is willing to continue Plavix this week. I do not think his leg/knee pain is due to the Plavix. CP and SOB resolved off of Brilinta. cdm

## 2014-12-22 ENCOUNTER — Encounter: Payer: Self-pay | Admitting: Physician Assistant

## 2014-12-22 ENCOUNTER — Ambulatory Visit (INDEPENDENT_AMBULATORY_CARE_PROVIDER_SITE_OTHER): Payer: Commercial Managed Care - HMO | Admitting: Physician Assistant

## 2014-12-22 VITALS — BP 140/80 | HR 66 | Resp 20 | Ht 70.0 in | Wt 226.0 lb

## 2014-12-22 DIAGNOSIS — E785 Hyperlipidemia, unspecified: Secondary | ICD-10-CM | POA: Diagnosis not present

## 2014-12-22 DIAGNOSIS — R42 Dizziness and giddiness: Secondary | ICD-10-CM

## 2014-12-22 DIAGNOSIS — I10 Essential (primary) hypertension: Secondary | ICD-10-CM

## 2014-12-22 DIAGNOSIS — R609 Edema, unspecified: Secondary | ICD-10-CM

## 2014-12-22 DIAGNOSIS — R2242 Localized swelling, mass and lump, left lower limb: Secondary | ICD-10-CM

## 2014-12-22 DIAGNOSIS — I2510 Atherosclerotic heart disease of native coronary artery without angina pectoris: Secondary | ICD-10-CM

## 2014-12-22 NOTE — Progress Notes (Signed)
Cardiology Office Note   Date:  12/22/2014   ID:  [name], DOB [birth year], MRN [id number]  PCP:  [name], MD  Cardiologist:  Dr. [name]     Chief Complaint  Patient presents with  . Coronary Artery Disease    Status post catheterization     History of Present Illness: Christopher STUEVE is a 74 y.o. male with a hx of CAD, HTN, HL, OSA, bradycardia.  Previously followed by Dr. [name].  Prior cardiac cath in 2009 demonstrated non-obstructive CAD.    Admitted 10/2014 with unstable angina.  CEs remained neg. LHC demonstrated severe RPDA disease and borderline mid LAD disease.  FFR of mid LAD disease demonstrated hemodynamic significance.  He underwent PCI with Xience DES to RPDA and LAD.    The patient returned for follow-up on 5/6. He complained of recurrent substernal chest heaviness with any type of activity. We admitted him to the hospital for further evaluation to include re-look cardiac catheterization. This demonstrated a patent stent in LAD and RCA and nonobstructive disease elsewhere. Symptoms were suspected to be from [place]. He was transitioned to Plavix. Prilosec was changed to Protonix. He returns for follow-up.   He is doing much better. Breathing has improved. He still has chest pain with some activities. However, symptoms are overall improved. He denies orthopnea, PND. He has occasional pedal edema. He does have some left lower extremity edema as well as pea-sized mass mid calf. He denies syncope. However, he does have occasional dizziness/near syncope. This can occur while seated or while standing.   Studies/Reports Reviewed Today:  LHC 11/26/14  Prox RCA lesion, 20% stenosed.  Mid RCA lesion, 40% stenosed.  Mid RCA to Dist RCA lesion, 30% stenosed.  A drug-eluting stent was placed.  Mid Cx lesion, 30% stenosed.  Ost LAD to Prox LAD lesion, 50% stenosed.  Dist LAD lesion, 25% stenosed. 1. Double vessel CAD with patent stents LAD and  RCA 2. Moderate proximal LAD stenosis that was evaluated with a FFR 10 days ago and was not flow limiting, unchanged in appearance from cath in 2014 3. Normal LV systolic function 4. Possible dyspnea and chest pressure from Brilinta Recommendations: Will change Brilinta to Plavix. Continue other cardiac meds.   Echo 11/06/14 - Mild concentric hypertrophy. EF 55% to 60%. Wall motion was normal; Grade 1 diastolic dysfunction.  - Tricuspid valve: There was trivial regurgitation. - Pericardium, extracardiac: There was no pericardial effusion.  LHC/PCI 11/16/14 EF 55-60% LM:  OK LAD:  prox 50% then 60-80% and 80% beyond D2; D1 50% LCx: dist 20-30% RCA:  20-30%; RPDA mid 95% PCI:  Xience Alpine 2.25 x 15 mm DES to mid RPDA; FFR guided Xience Alpine 2.75 x 33 mm DES to mid LAD   Past Medical History  Diagnosis Date  . Anemia   . Hyperlipidemia   . Hypertension   . Allergy   . Myocardial infarction   . GERD (gastroesophageal reflux disease)   . Restless leg syndrome   . Hypothyroidism   . Headache(784.0)   . Obstructive sleep apnea     pt refused  . CAD (coronary artery disease)     a. Nonobst disease by cath 2009. b. Cath 08/2012: moderate nonobstructive CAD - 50% prox LAD, 30% mLAD, 30% OM, 40% mRCA, EF 50-55%.;  c. USA>> s/p DES to RPDA, DES to mLAD;  d. LHC 5/16:  pRCA 20, mRCA 40, m-dRCA 30, RCA stent ok, mCFX 30, ost-prox LAD 50, dLAD,  LAD stent ok   . Gastritis 2011  . Bronchitis   . Back pain     low  . Diverticulosis   . Adenomatous polyp of colon 2007  . AVM (arteriovenous malformation) 2011    a. S/p argon plasma coagulation and ablation in 2011.  . Statin intolerance   . Bradycardia     a. H/o almost 7sec pause nocturnally during 2011 admission. Also has h/o fatigue with BB.    Past Surgical History  Procedure Laterality Date  . Appendectomy    . Hernia repair    . Tonsillectomy    . Ankle surgery Left   . Colonoscopy  08-23-05    per Dr. Deatra Ina, adenomatous  polyps, repeat in 5 yrs   . Esophagogastroduodenoscopy  08-23-05    per Dr. Deatra Ina, cauterized jejunal AVMs   . Skin graft Right     leg  . Left heart catheterization with coronary angiogram N/A 09/08/2012    Procedure: LEFT HEART CATHETERIZATION WITH CORONARY ANGIOGRAM;  Surgeon: Burnell Blanks, MD;  Location: [facility] CATH LAB;  Service: Cardiovascular;  Laterality: N/A;  . Left heart catheterization with coronary angiogram N/A 11/16/2014    Procedure: LEFT HEART CATHETERIZATION WITH CORONARY ANGIOGRAM;  Surgeon: Leonie Man, MD;  Location[facility] C[facility] LAB;  Service: Cardiovascular;  Laterality: N/A;  . Cardiac catheterization N/A 11/26/2014    Procedure: Left Heart Cath and Coronary Angiography;  Surgeon: Burnell Blanks, MD;  Location: [facility] CV LAB;  Service: Cardiovascular;  Laterality: N/A;     Current Outpatient Prescriptions  Medication Sig Dispense Refill  . amLODipine (NORVASC) 5 MG tablet TAKE 1 TABLET DAILY 90 tablet 3  . Ascorbic Acid (VITAMIN C PO) Take 1 tablet by mouth daily as needed (flu like symptopms).    [name] aspirin 81 MG tablet Take 81 mg by mouth daily.      . clonazePAM (KLONOPIN) 0.5 MG tablet Take 1 tablet (0.5 mg total) by mouth 3 (three) times daily as needed for anxiety (or as needed for sleep). 270 tablet 1  . clopidogrel (PLAVIX) 75 MG tablet Take 1 tablet (75 mg total) by mouth daily. 30 tablet 11  . cyclobenzaprine (FLEXERIL) 10 MG tablet TAKE 1 TABLET THREE TIMES DAILY AS NEEDED FOR MUSCLE SPASMS 90 tablet 0  . diclofenac sodium (VOLTAREN) 1 % GEL Apply 1 application topically daily as needed (ankle pain). 5 Tube 10  . fluticasone (FLONASE) 50 MCG/ACT nasal spray Place 1 spray into both nostrils daily. 48 g 3  . furosemide (LASIX) 40 MG tablet TAKE 1 TABLET BY MOUTH EVERY DAY as needed. 30 tablet 11  . HYDROcodone-acetaminophen (NORCO/VICODIN) 5-325 MG per tablet Take 1 tablet by mouth every 12 (twelve) hours as needed for moderate pain. 120 tablet 0    . levothyroxine (SYNTHROID, LEVOTHROID) 150 MCG tablet TAKE 1 TABLET DAILY 90 tablet 3  . NITROSTAT 0.4 MG SL tablet PLACE 1 TABLET UNDER TONGUE EVERY 5 MINUTES AS NEEDED FOR CHEST PAIN 25 tablet 0  . oxyCODONE-acetaminophen (PERCOCET) 10-325 MG per tablet Take 1 tablet by mouth daily as needed (moderate pain). 120 tablet 0  . pantoprazole (PROTONIX) 40 MG tablet Take 1 tablet (40 mg total) by mouth daily. 30 tablet 11  . ropinirole (REQUIP) 5 MG tablet TAKE 1 TABLET AT BEDTIME 90 tablet 3  . Tetrahydrozoline HCl (MURINE FOR RED EYES OP) Place 1 drop into both eyes daily as needed (red eyes).     No current facility-administered medications for  this visit.    Allergies:   Brilinta; Metoprolol tartrate; Shellfish allergy; Atorvastatin; Lipitor; Simvastatin; Trazodone and nefazodone; Zolpidem; and Amoxicillin-pot clavulanate    Social History:  The patient  reports that he quit smoking about 12 years ago. His smoking use included Cigarettes. He has a 100 pack-year smoking history. He has never used smokeless tobacco. He reports that he does not drink alcohol or use illicit drugs.   Family History:  The patient's family history includes Alcoholism in his maternal grandfather and paternal uncle; Heart attack in his father; Heart disease in his father; Leukemia in his mother; Stroke in his father.    ROS:   Please see the history of present illness.   Review of Systems  Cardiovascular: Positive for dyspnea on exertion.  Musculoskeletal: Positive for back pain.  Neurological: Positive for loss of balance.     PHYSICAL EXAM: VS:  BP 140/80 mmHg  Pulse 66  Resp 20  Ht 5\' 10"  (1.778 m)  Wt 226 lb (102.513 kg)  BMI 32.43 kg/m2    Wt Readings from Last 3 Encounters:  12/22/14 226 lb (102.513 kg)  11/26/14 223 lb (101.152 kg)  11/17/14 224 lb 6.9 oz (101.8 kg)     GEN: Well nourished, well developed, in no acute distress HEENT: normal Neck: no JVD,  no masses Cardiac:  Normal S1/S2,  RRR; no murmur ,  no rubs or gallops, trace bilateral LE edema, small pea-sized mass left mid calf ; right wrist without hematoma or mass  Respiratory:  Crackles in bases bilaterally, no wheezing, rhonchi  GI: soft, nontender, nondistended, + BS MS: no deformity or atrophy Skin: warm and dry  Neuro:  CNs II-XII intact, Strength and sensation are intact Psych: Normal affect   EKG:  EKG is not ordered today.  It demonstrates:   N/a   Recent Labs: 11/15/2014: TSH 0.277* 11/26/2014: ALT 20; B Natriuretic Peptide 21.3; BUN 17; Creatinine 1.11; Potassium 4.0; Sodium 138 11/27/2014: Hemoglobin 13.5; Platelets 354    Lipid Panel    Component Value Date/Time   CHOL 164 11/16/2014 0420   TRIG 212* 11/16/2014 0420   HDL 36* 11/16/2014 0420   CHOLHDL 4.6 11/16/2014 0420   VLDL 42* 11/16/2014 0420   LDLCALC 86 11/16/2014 0420      ASSESSMENT AND PLAN:  Coronary artery disease:  He returns for follow-up after recent admission to the hospital for relook cardiac catheterization. As noted, cardiac catheterization demonstrated patent stents in the LAD and RCA. He had nonobstructive disease elsewhere. It was suspected that his symptoms may be coming from Brilinta side effects. He was transitioned to Plavix.  Symptoms are much improved. He still has occasional chest discomfort. We discussed the addition of nitrates versus increasing his amlodipine. For now, he would like to hold off. He will continue to increase his activity. We can certainly add one of these drugs later if needed. Otherwise, continue aspirin, Plavix, amlodipine.  Essential hypertension:  Controlled.   Hyperlipidemia:  He is intol of statins. We can consider referral to the lipid clinic for possible PCSK-9 in the future.  Sleep apnea:  Adherence with CPAP was poor in the past and his device was removed from his home.  Dizziness:  He apparently had bradycardia in the hospital after his PCI. He's had a history of bradycardia with beta  blockers in the past. I will arrange an event monitor.  Edema:  Arrange venous duplex LLE.  Current medicines are reviewed at length with the patient today.  Concerns regarding medicines are as outlined above.  The following changes have been made:    None   Labs/ tests ordered today include:  Orders Placed This Encounter  Procedures  . Cardiac event monitor    Disposition:   FU  Dr. [name] 2 months.   Signed, Versie Starks, MHS 12/22/2014 4:39 PM    [facility] [address], [place], [place]  [address] Phone: [phone]; Fax: [phone]

## 2014-12-22 NOTE — Patient Instructions (Addendum)
Medication Instructions:  Your physician recommends that you continue on your current medications as directed. Please refer to the Current Medication list given to you today.   Labwork: NONE  Testing/Procedures: 1. Your physician has recommended that you wear an 30 DAY event monitor. Event monitors are medical devices that record the heart's electrical activity. Doctors most often [place] these monitors to diagnose arrhythmias. Arrhythmias are problems with the speed or rhythm of the heartbeat. The monitor is a small, portable device. You can wear one while you do your normal daily activities. This is usually used to diagnose what is causing palpitations/syncope (passing out).  2. Your physician has requested that you have a lower venous duplex LEFT LEG MID CALF MASS, EDEMA. This test is an ultrasound of the veins in the legs or arms. It looks at venous blood flow that carries blood from the heart to the legs or arms. Allow one hour for a Lower Venous exam. Allow thirty minutes for an Upper Venous exam. There are no restrictions or special instructions.  Follow-Up: YOU WILL NEED TO FOLLOW UP WITH DR. [name] ON 04/01/15 @ 12:15  Any Other Special Instructions Will Be Listed Below (If Applicable).

## 2014-12-24 ENCOUNTER — Ambulatory Visit (HOSPITAL_COMMUNITY): Payer: Commercial Managed Care - HMO | Attending: Internal Medicine

## 2014-12-24 DIAGNOSIS — R609 Edema, unspecified: Secondary | ICD-10-CM | POA: Diagnosis not present

## 2014-12-24 DIAGNOSIS — R2242 Localized swelling, mass and lump, left lower limb: Secondary | ICD-10-CM | POA: Insufficient documentation

## 2014-12-28 ENCOUNTER — Telehealth: Payer: Self-pay | Admitting: *Deleted

## 2014-12-28 NOTE — Telephone Encounter (Signed)
Lmom no DVT on Venous US. Any questions call back [phone].

## 2014-12-29 ENCOUNTER — Ambulatory Visit (INDEPENDENT_AMBULATORY_CARE_PROVIDER_SITE_OTHER): Payer: Commercial Managed Care - HMO

## 2014-12-29 DIAGNOSIS — R42 Dizziness and giddiness: Secondary | ICD-10-CM | POA: Diagnosis not present

## 2015-01-05 ENCOUNTER — Telehealth: Payer: Self-pay | Admitting: Family Medicine

## 2015-01-05 ENCOUNTER — Telehealth: Payer: Self-pay | Admitting: *Deleted

## 2015-01-05 NOTE — Telephone Encounter (Signed)
Patient is requesting re-fill on HYDROcodone-acetaminophen (NORCO/VICODIN) 5-325 MG per tablet but would like to know if it can be increased to 10 because he has to take 2 sometimes due to the increased pain in his leg.  Also re-fill on oxyCODONE-acetaminophen (PERCOCET) 10-325 MG per tablet.

## 2015-01-05 NOTE — Telephone Encounter (Signed)
[employer] faxed Event Strip from 6/15 @ 2:54 am cst. Considered "Serious Notificiation" - HR 69; Sinus Rhythm w/PVC's (5)/PACs. Listed as "patient activated - No Symptom- Accidental".  Reviewed by Ignacia Bayley, NP.  No orders obtained. Will be placed in Dr. Camillia Herter box.

## 2015-01-06 MED ORDER — OXYCODONE-ACETAMINOPHEN 10-325 MG PO TABS: 1.0000 | ORAL_TABLET | Freq: Every day | ORAL | Status: DC | PRN

## 2015-01-06 NOTE — Telephone Encounter (Signed)
Patient stopped by to pick up his and his wife's RX and was advised that you needed to talk to him about his re-fill request.  He would like for you to call him back since you were already gone when he stopped by.

## 2015-01-06 NOTE — Telephone Encounter (Signed)
Per Dry. Sarajane Jews, we can increase the dosage on Percocet. I left a voice message for pt to return my call.

## 2015-01-06 NOTE — Telephone Encounter (Signed)
Due to recent DEA rules he cannot take both hydrocodone and oxycodone at the same time. We will stop the hydrocodone but I can refill the Oxycodone (Percocet).

## 2015-01-07 NOTE — Telephone Encounter (Signed)
I spoke with pt and he will pick up script for the Percocet, the one we have ready.

## 2015-01-27 ENCOUNTER — Ambulatory Visit (INDEPENDENT_AMBULATORY_CARE_PROVIDER_SITE_OTHER): Payer: Commercial Managed Care - HMO | Admitting: Family Medicine

## 2015-01-27 ENCOUNTER — Encounter: Payer: Self-pay | Admitting: Family Medicine

## 2015-01-27 VITALS — BP 128/81 | HR 83 | Temp 98.1°F | Ht 70.0 in | Wt 225.0 lb

## 2015-01-27 DIAGNOSIS — M25512 Pain in left shoulder: Secondary | ICD-10-CM

## 2015-01-27 MED ORDER — OXYCODONE-ACETAMINOPHEN 10-325 MG PO TABS: 1.0000 | ORAL_TABLET | Freq: Every day | ORAL | Status: DC | PRN

## 2015-01-27 MED ORDER — PREDNISONE 10 MG PO TABS: ORAL_TABLET | ORAL | Status: DC

## 2015-01-27 MED ORDER — METHYLPREDNISOLONE ACETATE 80 MG/ML IJ SUSP
160.0000 mg | Freq: Once | INTRAMUSCULAR | Status: AC
  Administered 2015-01-27: 160 mg via INTRAMUSCULAR

## 2015-01-27 MED ORDER — CEFUROXIME AXETIL 500 MG PO TABS: 500.0000 mg | ORAL_TABLET | Freq: Two times a day (BID) | ORAL | Status: DC

## 2015-01-27 MED ORDER — METHYLPREDNISOLONE ACETATE 80 MG/ML IJ SUSP
120.0000 mg | Freq: Once | INTRAMUSCULAR | Status: AC
  Administered 2015-01-27: 160 mg via INTRAMUSCULAR

## 2015-01-27 NOTE — Progress Notes (Signed)
   Subjective:    Patient ID: [name], male    DOB: 1941-06-05, 74 y.o.   MRN: [id number]  HPI Here for the sudden onset of severe pain in the left shoulder 2 days ago. No recent trauma. He often gets gout in the feet or the knees, and he thinks this may be gout as well. Using Voltaren gel and some of his wife's Meloxicam with little relief.    Review of Systems  Constitutional: Negative.   Musculoskeletal: Positive for joint swelling and arthralgias.       Objective:   Physical Exam  Constitutional:  In pain   Musculoskeletal:  He is very tender in the anterior left shoulder. He guards this closely and moves it as little as possible. ROM is very limited by pain          Assessment & Plan:  Shoulder pain, possible bursitis. Given a shot of steroids to be followed by a prednisone taper. Add heat and Percocet prn

## 2015-01-27 NOTE — Progress Notes (Signed)
Pre visit review using our clinic review tool, if applicable. No additional management support is needed unless otherwise documented below in the visit note. 

## 2015-01-27 NOTE — Addendum Note (Signed)
Addended by: Aggie Hacker A on: 01/27/2015 11:00 AM   Modules accepted: Orders

## 2015-01-27 NOTE — Addendum Note (Signed)
Addended by: Aggie Hacker A on: 01/27/2015 11:33 AM   Modules accepted: Orders

## 2015-03-06 ENCOUNTER — Other Ambulatory Visit: Payer: Self-pay | Admitting: Family Medicine

## 2015-04-01 ENCOUNTER — Encounter: Payer: Self-pay | Admitting: Cardiovascular Disease

## 2015-04-01 ENCOUNTER — Ambulatory Visit (INDEPENDENT_AMBULATORY_CARE_PROVIDER_SITE_OTHER): Payer: Commercial Managed Care - HMO | Admitting: Cardiovascular Disease

## 2015-04-01 VITALS — BP 102/66 | HR 76 | Ht 70.0 in | Wt 219.2 lb

## 2015-04-01 DIAGNOSIS — I25118 Atherosclerotic heart disease of native coronary artery with other forms of angina pectoris: Secondary | ICD-10-CM

## 2015-04-01 DIAGNOSIS — E785 Hyperlipidemia, unspecified: Secondary | ICD-10-CM

## 2015-04-01 MED ORDER — NITROGLYCERIN 0.4 MG SL SUBL: SUBLINGUAL_TABLET | SUBLINGUAL | Status: DC

## 2015-04-01 NOTE — Progress Notes (Signed)
Chief Complaint  Patient presents with  . Follow-up      History of Present Illness: [name] with history of CAD, HTN, HLD, OSA, bradycardia here today for cardiac follow up. He has been followed in the past by Dr. [name]. Catheterization in 2009 showed nonobstructive CAD (50% LAD stenosis, 40% RCA stenosis). October 2011 he was admitted from the GI office was in acute GI bleeding. He required transfusions. He was seen by Dr. Sarajane Jews on 05/23/11 and was started on lasix for lower extremity edema and SOB. His breathing was much better on lasix. His lower extremity edema resolved. Echocardiogram November 2012 showed normal LV function and normal wall motion. He does not tolerate statins. He was seen in the ED on 08/19/12 with chest pain by Dr. Haroldine Laws and sent home with plans for outpatient f/u. Troponin was normal and EKG was unchanged. He had been arguing with a friend that day. He began to have pain that lasted for 4 hours. The pain was severe. He is intolerant of statins.  I saw him 08/27/12 in the office and arranged a cardiac cath on 09/08/12.  He was found to have mild to moderate non-obstructive CAD. No PCI was needed. His right groin was very painful after the cath. Arterial doppler without any evidence of pseudoaneurysm or AV fistula. Admitted 4/25-4/27/16 with  unstable angina. CEs remained neg. LHC demonstrated severe RPDA disease and borderline mid LAD disease. FFR of mid LAD disease demonstrated hemodynamic significance. He underwent PCI with Xience DES to RPDA and LAD.Readmitted May 2016 with chest pain and dyspnea. Relook cath may 2016 with patent stents. Brilinta changed to Plavix and symtpoms resolved. Holter monitor June 2016 with PACs, PVCs, sinus.   He is here today for follow up.  He feels great. No chest pain. No SOB. He is having back pain.   Primary Care Physician: [name]  Last Lipid Profile:Lipid Panel     Component Value Date/Time   CHOL 164 11/16/2014 0420   TRIG 212* 11/16/2014 0420   HDL 36* 11/16/2014 0420   CHOLHDL 4.6 11/16/2014 0420   VLDL 42* 11/16/2014 0420   LDLCALC 86 11/16/2014 0420     Past Medical History  Diagnosis Date  . Anemia   . Hyperlipidemia   . Hypertension   . Allergy   . Myocardial infarction   . GERD (gastroesophageal reflux disease)   . Restless leg syndrome   . Hypothyroidism   . Headache(784.0)   . Obstructive sleep apnea     pt refused  . CAD (coronary artery disease)     a. Nonobst disease by cath 2009. b. Cath 08/2012: moderate nonobstructive CAD - 50% prox LAD, 30% mLAD, 30% OM, 40% mRCA, EF 50-55%.;  c. USA>> s/p DES to RPDA, DES to mLAD;  d. LHC 5/16:  pRCA 65, mRCA 33, m-dRCA 30, RCA stent ok, mCFX 30, ost-prox LAD 50, dLAD, LAD stent ok   . Gastritis 2011  . Bronchitis   . Back pain     low  . Diverticulosis   . Adenomatous polyp of colon 2007  . AVM (arteriovenous malformation) 2011    a. S/p argon plasma coagulation and ablation in 2011.  . Statin intolerance   . Bradycardia     a. H/o almost 7sec pause nocturnally during 2011 admission. Also has h/o fatigue with BB.    Past Surgical History  Procedure Laterality Date  . Appendectomy    . Hernia repair    .  Tonsillectomy    . Ankle surgery Left   . Colonoscopy  08-23-05    per Dr. Deatra Ina, adenomatous polyps, repeat in 5 yrs   . Esophagogastroduodenoscopy  08-23-05    per Dr. Deatra Ina, cauterized jejunal AVMs   . Skin graft Right     leg  . Left heart catheterization with coronary angiogram N/A 09/08/2012    Procedure: LEFT HEART CATHETERIZATION WITH CORONARY ANGIOGRAM;  Surgeon: Burnell Blanks, MD;  Location: [facility] CATH LAB;  Service: Cardiovascular;  Laterality: N/A;  . Left heart catheterization with coronary angiogram N/A 11/16/2014    Procedure: LEFT HEART CATHETERIZATION WITH CORONARY ANGIOGRAM;  Surgeon: Leonie Man, MD;  Location: [facility] CATH LAB;  Service: Cardiovascular;  Laterality: N/A;  . Cardiac catheterization N/A  11/26/2014    Procedure: Left Heart Cath and Coronary Angiography;  Surgeon: Burnell Blanks, MD;  Location: [facility] CV LAB;  Service: Cardiovascular;  Laterality: N/A;    Current Outpatient Prescriptions  Medication Sig Dispense Refill  . amLODipine (NORVASC) 5 MG tablet TAKE 1 TABLET DAILY 90 tablet 3  . Ascorbic Acid (VITAMIN C PO) Take 1 tablet by mouth daily as needed (flu like symptopms).    [name] aspirin 81 MG tablet Take 81 mg by mouth daily.      . clonazePAM (KLONOPIN) 0.5 MG tablet Take 1 tablet (0.5 mg total) by mouth 3 (three) times daily as needed for anxiety (or as needed for sleep). 270 tablet 1  . clopidogrel (PLAVIX) 75 MG tablet Take 1 tablet (75 mg total) by mouth daily. 30 tablet 11  . cyclobenzaprine (FLEXERIL) 10 MG tablet TAKE 1 TABLET THREE TIMES DAILY AS NEEDED FOR MUSCLE SPASMS 90 tablet 0  . diclofenac sodium (VOLTAREN) 1 % GEL Apply 1 application topically daily as needed (ankle pain). 5 Tube 10  . fluticasone (FLONASE) 50 MCG/ACT nasal spray Place 1 spray into both nostrils daily. 48 g 3  . levothyroxine (SYNTHROID, LEVOTHROID) 150 MCG tablet TAKE 1 TABLET BY MOUTH DAILY 30 tablet 3  . nitroGLYCERIN (NITROSTAT) 0.4 MG SL tablet PLACE 1 TABLET UNDER TONGUE EVERY 5 MINUTES AS NEEDED FOR CHEST PAIN 75 tablet 3  . omeprazole (PRILOSEC) 20 MG capsule Take 20 mg by mouth daily.    [name] oxyCODONE-acetaminophen (PERCOCET) 10-325 MG per tablet Take 1 tablet by mouth daily as needed (moderate pain). 120 tablet 0  . pantoprazole (PROTONIX) 40 MG tablet Take 1 tablet (40 mg total) by mouth daily. 30 tablet 11  . ropinirole (REQUIP) 5 MG tablet TAKE 1 TABLET AT BEDTIME 90 tablet 3  . Tetrahydrozoline HCl (MURINE FOR RED EYES OP) Place 1 drop into both eyes daily as needed (red eyes).     No current facility-administered medications for this visit.    Allergies  Allergen Reactions  . Brilinta [Ticagrelor] Shortness Of Breath  . Metoprolol Tartrate     Sever chest pains  " flat lined patient"  . Shellfish Allergy Anaphylaxis  . Atorvastatin     myalgias  . Lipitor [Atorvastatin Calcium]     myalgias  . Simvastatin     myalgias  . Trazodone And Nefazodone     Unsteady on feet  . Zolpidem     Chest pain  . Amoxicillin-Pot Clavulanate Rash    States recently prescribed and had no reaction when taken    Social History   Social History  . Marital Status: Married    Spouse Name: N/A  . Number of Children: 1  .  Years of Education: N/A   Occupational History  . Disabled    Social History Main Topics  . Smoking status: Former Smoker -- 2.00 packs/day for 50 years    Types: Cigarettes    Quit date: 07/23/2002  . Smokeless tobacco: Never Used  . Alcohol Use: No  . Drug Use: No  . Sexual Activity: Not on file   Other Topics Concern  . Not on file   Social History Narrative    Family History  Problem Relation Age of Onset  . Heart disease Father   . Stroke Father   . Leukemia Mother   . Alcoholism Paternal Uncle   . Alcoholism Maternal Grandfather   . Heart attack Father     Review of Systems:  As stated in the HPI and otherwise negative.   BP 102/66 mmHg  Pulse 76  Ht 5\' 10"  (1.778 m)  Wt 219 lb 3.2 oz (99.428 kg)  BMI 31.45 kg/m2  SpO2 96%  Physical Examination: General: Well developed, well nourished, NAD HEENT: OP clear, mucus membranes moist SKIN: warm, dry. No rashes. Neuro: No focal deficits Musculoskeletal: Muscle strength 5/5 all ext Psychiatric: Mood and affect normal Neck: No JVD, no carotid bruits, no thyromegaly, no lymphadenopathy. Lungs:Clear bilaterally, no wheezes, rhonci, crackles Cardiovascular: Regular rate and rhythm. No murmurs, gallops or rubs. Abdomen:Soft. Bowel sounds present. Non-tender.  Extremities: No lower extremity edema. Pulses are 2 + in the bilateral DP/PT.  Cardiac cath 11/26/14: Left Anterior Descending   . Ost LAD to Prox LAD lesion, 50% stenosed. discrete . The lesion was not  previously treated.   . Prox LAD to Mid LAD lesion, 0% stenosed. Previously placed Prox LAD to Mid LAD stent (unknown type) is patent.   Jorene Minors LAD lesion, 25% stenosed.      Left Circumflex   . Mid Cx lesion, 30% stenosed.     Right Coronary Artery   . Prox RCA lesion, 20% stenosed.   . Mid RCA lesion, 40% stenosed.   . Mid RCA to Dist RCA lesion, 30% stenosed.   . Right Posterior Descending Artery   . RPDA lesion, 0% stenosed. Previously placed RPDA drug eluting stent is patent.     EKG:  EKG is not ordered today. The ekg ordered today demonstrates   Recent Labs: 11/15/2014: TSH 0.277* 11/26/2014: ALT 20; B Natriuretic Peptide 21.3; BUN 17; Creatinine, Ser 1.11; Potassium 4.0; Sodium 138 11/27/2014: Hemoglobin 13.5; Platelets 354   Lipid Panel    Component Value Date/Time   CHOL 164 11/16/2014 0420   TRIG 212* 11/16/2014 0420   HDL 36* 11/16/2014 0420   CHOLHDL 4.6 11/16/2014 0420   VLDL 42* 11/16/2014 0420   LDLCALC 86 11/16/2014 0420     Wt Readings from Last 3 Encounters:  04/01/15 219 lb 3.2 oz (99.428 kg)  01/27/15 225 lb (102.059 kg)  12/22/14 226 lb (102.513 kg)     Other studies Reviewed: Additional studies/ records that were reviewed today include: . Review of the above records demonstrates:    Assessment and Plan:   1. CAD: Stable. PCI of the LAD and PDA in April 2016 with DES placed in both vessels. Relook cath May 2016 with patent stents. He did not tolerate Brilinta. Now on ASA and Plavix. He is not on a beta blocker due to bradycardia.   2. Lower ext edema: Resolved  3. Hyperlipidemia: He is intolerant of statins. (Crestor, lipitor, Zocor). Will refer to lipid clinic to discuss options.  Current medicines are reviewed at length with the patient today.  The patient does not have concerns regarding medicines.  The following changes have been made:  no change  Labs/ tests ordered today include:  No orders of the defined types were placed in this  encounter.     Disposition:   FU with me in 6 months   Signed, [name], MD 04/01/2015 4:41 PM    [facility] [place], [place], [place]  [address] Phone: [phone]; Fax: [phone]

## 2015-04-01 NOTE — Patient Instructions (Signed)
Medication Instructions:  Your physician recommends that you continue on your current medications as directed. Please refer to the Current Medication list given to you today.   Labwork: none  Testing/Procedures: You have been referred to Lipid Clinic--Please schedule patient for new patient appt.   Follow-Up: Your physician wants you to follow-up in: 6 months.  You will receive a reminder letter in the mail two months in advance. If you don't receive a letter, please call our office to schedule the follow-up appointment.   Any Other Special Instructions Will Be Listed Below (If Applicable).

## 2015-04-06 ENCOUNTER — Ambulatory Visit (INDEPENDENT_AMBULATORY_CARE_PROVIDER_SITE_OTHER): Payer: Commercial Managed Care - HMO | Admitting: Pharmacist

## 2015-04-06 DIAGNOSIS — E785 Hyperlipidemia, unspecified: Secondary | ICD-10-CM

## 2015-04-06 MED ORDER — EZETIMIBE 10 MG PO TABS: 10.0000 mg | ORAL_TABLET | Freq: Every day | ORAL | Status: DC

## 2015-04-06 NOTE — Progress Notes (Signed)
Patient ID: [name]                 DOB: [birth year], 74 yo                         MRN: [id number]     HPI: [name] is a 74 y.o. male patient referred to lipid clinic by Dr. [name]. PMH is significant for CAD, HTN, HLD, and OSA. Cath in 2009 showed nonobstructive CAD (50% LAD stenosis, 40% RCA stenosis). Cath in 2014 showed mild to moderate non-obstructive CAD. He was admitted in April 2016 with unstable angina and patient underwent PCI to RPDA and LAD. Patient also has a history of statin intolerance to 3 statins. He was not able to tolerate any of them more than 3 days without horrible myalgias.   Most recently, patient reported that he ran out of his ropinirole and went to borrow one from his brother-in-law. However, he states that he accidentally took a statin pill instead 2 days ago (does not know which one). He reports that he had extreme leg and back pain and couldn't sleep. He took hot baths and used Voltaren gel as well. He refuses to try any more statins since he had extreme pain after just 1 dose.  Of note, patient also with omeprazole and pantoprazole on med list. Patient taking Plavix - drug interaction with omeprazole. Advised patient to d/c omeprazole.  Patient also mentions that he has limited income - his wife was previously diagnosed with lung cancer and they do not have much savings left.  Current Medications: none Intolerances: Crestor, Lipitor, Zocor - does not remember doses, no records at [facility] prior to 2013. Used to take fish oil and tolerated it well. Stopped taking when he reports that he had fish one night for dinner and then had a gout flare. Risk Factors: CAD with 40-50% stenosis s/p PCI, age, sex LDL goal: 70mg /dL   Diet: Patient rarely eats breakfast. Has a peanut butter sandwich for lunch. Dinner - ham and cheese sandwich, occasionally fried chicken (once every 2 weeks). Snacks on PB crackers and oreos. Drinks "1-5" cups of coffee a day and half a  gallon of sweet tea. He reports that he uses 1 1/3 cup of sugar in his which is less than his friends who use 1 1/2 cup of sugar.  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 He has lost weight over the last 6 months from 248 to 213 lbs. His goal weight is < 200 lbs.  Family History: Heart disease, MI, and stroke in his father.  Social History: Former smoker - 2 packs a day for 50 years, quit in 2004. No alcohol or drug use.  Labs: 11/16/14: TC 164, TG 212, HDL 36, LDL 86, LFTs wnl (no therapy)  Past Medical History  Diagnosis Date  . Anemia   . Hyperlipidemia   . Hypertension   . Allergy   . Myocardial infarction   . GERD (gastroesophageal reflux disease)   . Restless leg syndrome   . Hypothyroidism   . Headache(784.0)   . Obstructive sleep apnea     pt refused  . CAD (coronary artery disease)     a. Nonobst disease by cath 2009. b. Cath 08/2012: moderate nonobstructive CAD - 50% prox LAD, 30% mLAD, 30% OM, 40% mRCA, EF 50-55%.;  c. USA>> s/p DES to RPDA, DES to mLAD;  d. [place] 5/16:  pRCA 13, mRCA 80, m-dRCA 30, RCA stent ok, mCFX 30, ost-prox LAD 50, dLAD, LAD stent ok   . Gastritis 2011  . Bronchitis   . Back pain     low  . Diverticulosis   . Adenomatous polyp of colon 2007  . AVM (arteriovenous malformation) 2011    a. S/p argon plasma coagulation and ablation in 2011.  . Statin intolerance   . Bradycardia     a. H/o almost 7sec pause nocturnally during 2011 admission. Also has h/o fatigue with BB.    Current Outpatient Prescriptions on File Prior to Visit  Medication Sig Dispense Refill  . amLODipine (NORVASC) 5 MG tablet TAKE 1 TABLET DAILY 90 tablet 3  . Ascorbic Acid (VITAMIN C PO) Take 1 tablet by mouth daily as needed (flu like symptopms).    [name] aspirin 81 MG tablet Take 81 mg by mouth daily.      . clonazePAM (KLONOPIN) 0.5 MG tablet Take 1 tablet (0.5 mg total) by mouth 3 (three) times  daily as needed for anxiety (or as needed for sleep). 270 tablet 1  . clopidogrel (PLAVIX) 75 MG tablet Take 1 tablet (75 mg total) by mouth daily. 30 tablet 11  . cyclobenzaprine (FLEXERIL) 10 MG tablet TAKE 1 TABLET THREE TIMES DAILY AS NEEDED FOR MUSCLE SPASMS 90 tablet 0  . diclofenac sodium (VOLTAREN) 1 % GEL Apply 1 application topically daily as needed (ankle pain). 5 Tube 10  . fluticasone (FLONASE) 50 MCG/ACT nasal spray Place 1 spray into both nostrils daily. 48 g 3  . levothyroxine (SYNTHROID, LEVOTHROID) 150 MCG tablet TAKE 1 TABLET BY MOUTH DAILY 30 tablet 3  . nitroGLYCERIN (NITROSTAT) 0.4 MG SL tablet PLACE 1 TABLET UNDER TONGUE EVERY 5 MINUTES AS NEEDED FOR CHEST PAIN 75 tablet 3  . omeprazole (PRILOSEC) 20 MG capsule Take 20 mg by mouth daily.    [name] oxyCODONE-acetaminophen (PERCOCET) 10-325 MG per tablet Take 1 tablet by mouth daily as needed (moderate pain). 120 tablet 0  . pantoprazole (PROTONIX) 40 MG tablet Take 1 tablet (40 mg total) by mouth daily. 30 tablet 11  . ropinirole (REQUIP) 5 MG tablet TAKE 1 TABLET AT BEDTIME 90 tablet 3  . Tetrahydrozoline HCl (MURINE FOR RED EYES OP) Place 1 drop into both eyes daily as needed (red eyes).     No current facility-administered medications on file prior to visit.    Allergies  Allergen Reactions  . Brilinta [Ticagrelor] Shortness Of Breath  . Metoprolol Tartrate     Sever chest pains " flat lined patient"  . Shellfish Allergy Anaphylaxis  . Atorvastatin     myalgias  . Lipitor [Atorvastatin Calcium]     myalgias  . Simvastatin     myalgias  . Trazodone And Nefazodone     Unsteady on feet  . Zolpidem     Chest pain  . Amoxicillin-Pot Clavulanate Rash    States recently prescribed and had no reaction when taken    Assessment/Plan:  1. Hyperlipidemia - Patient with LDL above goal 70mg /dL at 86mg /dL. Most recent lipid panel was from April 2016. Since then, patient has lost 35 lbs and is determined to lose another 15.  He reports statin intolerances with Crestor, Lipitor, and Zocor, and could not take any of them for more than 3 days (severe myalgias). Discussed lifestyle modifications vs. drug therapy given that patient is close to LDL goal. Patient will work to cut back on the sugar he  uses in his sweet tea and coffee, and he will try to limit fried foods. Also sent a prescription for Zetia 10mg  once daily. Patient stated that if it's too expensive, he will not start taking it. Provided patient with number for [facility], which can help with medication coverage for Medicare patients. Patient also has elevated TG of 212, goal < 150. Discussed that this is due to patient's high sugar intake (he drinks 1/2 gallon of sweet tea a day). He is also pre-diabetic with an A1c of 6.2. As noted above, patient will try to cut back on sugar. He will also start taking fish oil 2g daily. Will f/u with lipid panel in 6 weeks. Patient is in agreement with plan.  2. Duplication of therapy - patient with omeprazole and pantoprazole on medication list. Patient also taking Plavix - drug interaction with omeprazole. Advised patient to discontinue omeprazole.   Megan E. Supple, PharmD [facility] [address] [place], [place] [address] Phone: [phone]; Fax: [phone] 04/06/2015 12:27 PM

## 2015-04-06 NOTE — Patient Instructions (Addendum)
Start taking fish oil 2,000mg  daily Try to cut back on the sugar you put in your coffee and sweet tea, limit fried chicken Pick up Zetia 10mg  and start taking once daily Call [facility] program for medication assistance for Zetia [phone]) Recheck lipid panel in 6 weeks on Monday, October 24 (fasting lipid panel, lab opens at 7:30, come any time after)

## 2015-04-12 ENCOUNTER — Other Ambulatory Visit: Payer: Self-pay | Admitting: Family Medicine

## 2015-04-13 ENCOUNTER — Other Ambulatory Visit: Payer: Self-pay | Admitting: Family Medicine

## 2015-04-13 ENCOUNTER — Other Ambulatory Visit: Payer: Self-pay

## 2015-04-13 NOTE — Telephone Encounter (Signed)
Error

## 2015-05-02 ENCOUNTER — Other Ambulatory Visit: Payer: Self-pay | Admitting: Family Medicine

## 2015-05-03 NOTE — Telephone Encounter (Signed)
Call in #[phone] with one rf 

## 2015-05-16 ENCOUNTER — Other Ambulatory Visit: Payer: Self-pay | Admitting: Family Medicine

## 2015-05-16 ENCOUNTER — Other Ambulatory Visit: Payer: Self-pay | Admitting: Cardiovascular Disease

## 2015-05-16 ENCOUNTER — Other Ambulatory Visit (INDEPENDENT_AMBULATORY_CARE_PROVIDER_SITE_OTHER): Payer: Commercial Managed Care - HMO | Admitting: *Deleted

## 2015-05-16 DIAGNOSIS — E785 Hyperlipidemia, unspecified: Secondary | ICD-10-CM

## 2015-05-16 NOTE — Telephone Encounter (Signed)
Refill request for Ropinirole 5 mg take 1 po qhs and [facility].

## 2015-05-17 ENCOUNTER — Other Ambulatory Visit: Payer: Self-pay | Admitting: Pharmacist

## 2015-05-17 LAB — LIPID PANEL
CHOL/HDL RATIO: 4.3 ratio (ref <=5.0)
CHOLESTEROL: 138 mg/dL (ref 125–200)
HDL: 32 mg/dL — ABNORMAL LOW (ref >=40)
LDL Cholesterol: 75 mg/dL (ref <130)
Triglycerides: 154 mg/dL — ABNORMAL HIGH (ref <150)
VLDL: 31 mg/dL — AB (ref <30)

## 2015-05-17 LAB — HEPATIC FUNCTION PANEL
ALBUMIN: 3.9 g/dL (ref 3.6–5.1)
ALT: 16 U/L (ref 9–46)
AST: 20 U/L (ref 10–35)
Alkaline Phosphatase: 60 U/L (ref 40–115)
BILIRUBIN DIRECT: 0.1 mg/dL (ref <=0.2)
Indirect Bilirubin: 0.3 mg/dL (ref 0.2–1.2)
Total Bilirubin: 0.4 mg/dL (ref 0.2–1.2)
Total Protein: 6.6 g/dL (ref 6.1–8.1)

## 2015-05-18 ENCOUNTER — Other Ambulatory Visit: Payer: Self-pay

## 2015-05-18 MED ORDER — CLOPIDOGREL BISULFATE 75 MG PO TABS: 75.0000 mg | ORAL_TABLET | Freq: Every day | ORAL | Status: DC

## 2015-05-18 MED ORDER — ROPINIROLE HCL 5 MG PO TABS: ORAL_TABLET | ORAL | Status: DC

## 2015-05-18 MED ORDER — PANTOPRAZOLE SODIUM 40 MG PO TBEC: 40.0000 mg | DELAYED_RELEASE_TABLET | Freq: Every day | ORAL | Status: DC

## 2015-05-18 NOTE — Telephone Encounter (Signed)
I sent script e-scribe. 

## 2015-05-30 ENCOUNTER — Ambulatory Visit (INDEPENDENT_AMBULATORY_CARE_PROVIDER_SITE_OTHER): Payer: Commercial Managed Care - HMO | Admitting: Family Medicine

## 2015-05-30 ENCOUNTER — Encounter: Payer: Self-pay | Admitting: Family Medicine

## 2015-05-30 VITALS — BP 132/79 | HR 85 | Temp 97.7°F | Ht 70.0 in | Wt 228.0 lb

## 2015-05-30 DIAGNOSIS — M545 Low back pain, unspecified: Secondary | ICD-10-CM

## 2015-05-30 MED ORDER — METHOCARBAMOL 750 MG PO TABS: 750.0000 mg | ORAL_TABLET | Freq: Four times a day (QID) | ORAL | Status: DC | PRN

## 2015-05-30 MED ORDER — OXYCODONE-ACETAMINOPHEN 10-325 MG PO TABS: 1.0000 | ORAL_TABLET | Freq: Every day | ORAL | Status: DC | PRN

## 2015-05-30 NOTE — Progress Notes (Signed)
Pre visit review using our clinic review tool, if applicable. No additional management support is needed unless otherwise documented below in the visit note. 

## 2015-05-30 NOTE — Progress Notes (Signed)
   Subjective:    Patient ID: [name], male    DOB: Sep 16, 1940, 74 y.o.   MRN: [id number]  HPI Here for one week of severe spasms and pain in the lower back. No radiation to the legs. He thinks this was the result of helping a large woman to her feet when she fell to the ground. He has been taking his usual Perocoet and Flexeril with no relief.    Review of Systems  Constitutional: Negative.   Musculoskeletal: Positive for back pain.       Objective:   Physical Exam  Constitutional:  In pain with obvious torsion of the spine  Musculoskeletal:  Tender in the right lower back with a lot of spasm. The spine has an S curve laterally due to this spasm          Assessment & Plan:  Low back spasms. Use heta and Percocet. We will switch to Robaxin prn and will refer him to PT.

## 2015-06-02 ENCOUNTER — Ambulatory Visit: Payer: Commercial Managed Care - HMO | Attending: Family Medicine

## 2015-06-02 DIAGNOSIS — M545 Low back pain, unspecified: Secondary | ICD-10-CM

## 2015-06-02 DIAGNOSIS — R6889 Other general symptoms and signs: Secondary | ICD-10-CM | POA: Diagnosis not present

## 2015-06-02 NOTE — Patient Instructions (Signed)
Cervico-Thoracic: Extension / Rotation (Sitting)    Reach across body with left arm and grasp back of chair. Gently look over right side shoulder. Hold __10__ seconds. Relax. Repeat _3___ times per set. Do __1__ sets per session. Do __3-4__ sessions per day.  http://orth.exer.us/980   Copyright  VHI. All rights reserved.  [place] [address] [place], [place] [address] Phone # [phone] Fax [phone]

## 2015-06-02 NOTE — Therapy (Addendum)
[facility] [address], [place], [place], [address] Phone: [phone]   Fax:  [phone]  Physical Therapy Evaluation  Patient Details  Name: [name] MRN: [id number] Date of Birth: 03-29-1941 Referring Provider: [name], MD  Encounter Date: 06/02/2015      PT End of Session - 06/02/15 1053    Visit Number 1   Number of Visits 10   PT Start Time 1020   PT Stop Time 1105   PT Time Calculation (min) 45 min   Activity Tolerance Patient tolerated treatment well   Behavior During Therapy [facility] for tasks assessed/performed      Past Medical History  Diagnosis Date  . Anemia   . Hyperlipidemia   . Hypertension   . Allergy   . Myocardial infarction ([place])   . GERD (gastroesophageal reflux disease)   . Restless leg syndrome   . Hypothyroidism   . Headache(784.0)   . Obstructive sleep apnea     pt refused  . CAD (coronary artery disease)     a. Nonobst disease by cath 2009. b. Cath 08/2012: moderate nonobstructive CAD - 50% prox LAD, 30% mLAD, 30% OM, 40% mRCA, EF 50-55%.;  c. USA>> s/p DES to RPDA, DES to mLAD;  d. LHC 5/16:  pRCA 81, mRCA 95, m-dRCA 30, RCA stent ok, mCFX 30, ost-prox LAD 50, dLAD, LAD stent ok   . Gastritis 2011  . Bronchitis   . Back pain     low  . Diverticulosis   . Adenomatous polyp of colon 2007  . AVM (arteriovenous malformation) 2011    a. S/p argon plasma coagulation and ablation in 2011.  . Statin intolerance   . Bradycardia     a. H/o almost 7sec pause nocturnally during 2011 admission. Also has h/o fatigue with BB.    Past Surgical History  Procedure Laterality Date  . Appendectomy    . Hernia repair    . Tonsillectomy    . Ankle surgery Left   . Colonoscopy  08-23-05    per Dr. Deatra Ina, adenomatous polyps, repeat in 5 yrs   . Esophagogastroduodenoscopy  08-23-05    per Dr. Deatra Ina, cauterized jejunal AVMs   . Skin graft Right     leg  . Left heart  catheterization with coronary angiogram N/A 09/08/2012    Procedure: LEFT HEART CATHETERIZATION WITH CORONARY ANGIOGRAM;  Surgeon: Burnell Blanks, MD;  Location: [facility] CATH LAB;  Service: Cardiovascular;  Laterality: N/A;  . Left heart catheterization with coronary angiogram N/A 11/16/2014    Procedure: LEFT HEART CATHETERIZATION WITH CORONARY ANGIOGRAM;  Surgeon: Leonie Man, MD;  Location: [facility] CATH LAB;  Service: Cardiovascular;  Laterality: N/A;  . Cardiac catheterization N/A 11/26/2014    Procedure: Left Heart Cath and Coronary Angiography;  Surgeon: Burnell Blanks, MD;  Location: [facility] CV LAB;  Service: Cardiovascular;  Laterality: N/A;    There were no vitals filed for this visit.  Visit Diagnosis:  Right-sided low back pain without sciatica - Plan: PT plan of care cert/re-cert  Activity intolerance - Plan: PT plan of care cert/re-cert      Subjective Assessment - 06/02/15 1024    Subjective Pt reports to PT with Rt sided lumbar pain that has been present for many years. Pt reports that he is "swollen" on the Rt side.  No imaging has been done.  Pt reports that he had to help his neighbor who fell and has had increased  pain since then (2 weeks ago)   Limitations Standing;Sitting;Walking   How long can you sit comfortably? 15-20 minutes   How long can you stand comfortably? 15-20 minutes   How long can you walk comfortably? <5 minutes   Diagnostic tests none   Patient Stated Goals reduce pain, standing with less pain   Currently in Pain? Yes   Pain Score 7    Pain Location Back   Pain Orientation Right   Pain Descriptors / Indicators Aching;Sore;Shooting   Pain Type Acute pain   Pain Onset More than a month ago   Pain Frequency Constant   Aggravating Factors  all movement   Pain Relieving Factors rest, hot bath            [facility] PT Assessment - 06/02/15 0001    Assessment   Medical Diagnosis Rt sided LBP without sciatica (M54.5)   Referring Provider  [name], MD   Onset Date/Surgical Date 06/01/13   Next MD Visit none   Precautions   Precautions None   Restrictions   Weight Bearing Restrictions No   Balance Screen   Has the patient fallen in the past 6 months Yes   How many times? 1   Has the patient had a decrease in activity level because of a fear of falling?  No   Is the patient reluctant to leave their home because of a fear of falling?  No   Home Environment   Living Environment Private residence   Type of [place] One level   Prior Function   Level of [place] Retired   Leisure makes Sports coach   Overall Cognitive Status Within Functional Limits for tasks assessed   Observation/Other Assessments   Focus on Therapeutic Outcomes (FOTO)  64% limitation   ROM / Strength   AROM / PROM / Strength AROM;PROM;Strength   AROM   Overall AROM  Deficits   Overall AROM Comments Lumbar AROM limited by 50% in all directions due to guarding with pain   PROM   Overall PROM  Unable to assess;Due to pain   Strength   Overall Strength Deficits   Overall Strength Comments 4/5 bilateral LE strength tested insitting   Palpation   Palpation comment Pt with marked palpable tenderness over Rt lumbar paraspinals and flank.  Pt with mild "bulge" at Rt flank possibly due to weak abdominals.   Ambulation/Gait   Ambulation/Gait Yes   Ambulation/Gait Assistance 6: Modified independent (Device/Increase time)   Ambulation Distance (Feet) 100 Feet   Assistive device Straight cane   Gait Pattern Step-through pattern   Gait velocity reduced gait velocity                   [facility] Adult PT Treatment/Exercise - 06/02/15 0001    Modalities   Modalities Electrical Stimulation   Electrical Stimulation   Electrical Stimulation Location Rt lumbar/flank   Electrical Stimulation Action IFC   Electrical Stimulation Parameters 15 minutes   Electrical Stimulation Goals Pain                 PT Education - 06/02/15 1045    Education provided Yes   Education Details HEP: seated rotation, home TENS education   Person(s) Educated Patient   Methods Demonstration;Explanation;Handout   Comprehension Verbalized understanding          PT Short Term Goals - 06/02/15 1145    PT SHORT TERM GOAL #1  Title be independent in initial HEP   Time 4   Period Weeks   Status New   PT SHORT TERM GOAL #2   Title report a 25% reduction in lumbar pain to improve mobility   Time 4   Period Weeks   Status New   PT SHORT TERM GOAL #3   Title perform bed mobility with 25% increased ease   Time 4   Period Weeks   Status New           PT Long Term Goals - 06-22-15 1034    PT LONG TERM GOAL #1   Title be independent in advanced HEP   Time 8   Period Weeks   Status New   PT LONG TERM GOAL #2   Title reduce FOTO to < or = to 41% limitation   Time 8   Period Weeks   Status New   PT LONG TERM GOAL #3   Title report a 50% reduction in LBP to improve ease of movement   Time 8   Period Weeks   Status New   PT LONG TERM GOAL #4   Title perform bed mobility without significant increase in pain   Time 8   Period Weeks   Status New               Plan - 06/22/2015 1114    Clinical Impression Statement Pt presents to PT with significant Rt lumbar/flank pain.  Pt reports that he has had this pain for years as well as a "bulge" in the side.  No imaging has been done.  Pt with painful and limited mobility, inability to lay supine and signifiicant palpable tenderness.  Pt will benefit from skilled PT for pain management, gentle flexiblity and mobility as tolerated and body mechanics education.   Pt will benefit from skilled therapeutic intervention in order to improve on the following deficits Abnormal gait;Decreased range of motion;Postural dysfunction;Impaired flexibility;Improper body mechanics;Decreased activity tolerance;Pain;Decreased mobility;Difficulty  walking   Rehab Potential Good   PT Frequency 2x / week   PT Duration 8 weeks   PT Treatment/Interventions ADLs/Self Care Home Management;Cryotherapy;Electrical Stimulation;Moist Heat;Therapeutic exercise;Therapeutic activities;Functional mobility training;Ultrasound;Neuromuscular re-education;Patient/family education;Manual techniques;Passive range of motion   PT Next Visit Plan Gentle flexiibility and core strength performed in sitting, continue e-stim as helpful   Consulted and Agree with Plan of Care Patient          G-Codes - 22-Jun-2015 1034    Functional Assessment Tool Used FOTO: 64% limitation   Functional Limitation Other PT primary   Other PT Primary Current Status (C5852) At least 60 percent but less than 80 percent impaired, limited or restricted   Other PT Primary Goal Status (D7824) At least 40 percent but less than 60 percent impaired, limited or restricted      G-codes 60-80% impaired in all 3 categories     Problem List Patient Active Problem List   Diagnosis Date Noted  . Chest pain with high risk for cardiac etiology 11/27/2014  . Dyspnea 11/27/2014  . Hypothyroidism 11/15/2014  . Essential hypertension 11/15/2014  . Hyperlipidemia LDL goal <70 11/15/2014  . Obstructive sleep apnea 11/15/2014  . Contact with and suspected exposure to environmental tobacco smoke 04/22/2013  . Leg pain 02/26/2012  . ANGIODYSPLASIA-INTESTINE 04/27/2010  . Coronary artery disease 10/14/2008  . GERD 01/13/2007    TAKACS,KELLY, PT June 22, 2015, 11:52 AM PHYSICAL THERAPY DISCHARGE SUMMARY  Visits from Start of Care: 1  Current functional level related to  goals / functional outcomes: Pt attended 1 PT session and requested D/C as he was feeling better with using a TENs unit.     Remaining deficits: Unknown as pt didn't return.     Education / Equipment: HEP Plan: Patient agrees to discharge.  Patient goals were not met. Patient is being discharged due to the patient's  request.  ?????   Sigurd Sos, PT 06/20/2015 3:25 PM  [facility] [address] [place], [place], [address] Phone: [phone]   Fax:  [phone]  Name: [name] MRN: [id number] Date of Birth: 1940-10-25

## 2015-06-21 ENCOUNTER — Ambulatory Visit: Payer: Commercial Managed Care - HMO

## 2015-07-27 ENCOUNTER — Ambulatory Visit (INDEPENDENT_AMBULATORY_CARE_PROVIDER_SITE_OTHER): Payer: Medicare Other | Admitting: Family Medicine

## 2015-07-27 ENCOUNTER — Encounter: Payer: Self-pay | Admitting: Family Medicine

## 2015-07-27 VITALS — BP 140/80 | Temp 98.5°F | Ht 70.0 in | Wt 226.3 lb

## 2015-07-27 DIAGNOSIS — J019 Acute sinusitis, unspecified: Secondary | ICD-10-CM

## 2015-07-27 DIAGNOSIS — M48061 Spinal stenosis, lumbar region without neurogenic claudication: Secondary | ICD-10-CM

## 2015-07-27 DIAGNOSIS — M4806 Spinal stenosis, lumbar region: Secondary | ICD-10-CM | POA: Diagnosis not present

## 2015-07-27 MED ORDER — HYDROCODONE-HOMATROPINE 5-1.5 MG/5ML PO SYRP: 5.0000 mL | ORAL_SOLUTION | ORAL | Status: DC | PRN

## 2015-07-27 MED ORDER — AMOXICILLIN 875 MG PO TABS: 875.0000 mg | ORAL_TABLET | Freq: Two times a day (BID) | ORAL | Status: DC

## 2015-07-27 MED ORDER — METHOCARBAMOL 750 MG PO TABS: 375.0000 mg | ORAL_TABLET | Freq: Four times a day (QID) | ORAL | Status: DC | PRN

## 2015-07-27 NOTE — Progress Notes (Signed)
Pre visit review using our clinic review tool, if applicable. No additional management support is needed unless otherwise documented below in the visit note. 

## 2015-07-27 NOTE — Progress Notes (Signed)
   Subjective:    Patient ID: [name], male    DOB: 07-16-1941, 75 y.o.   MRN: [id number]  HPI Here for 4 days of sinus pressure, PND, HA, ST, and a dry cough.   Review of Systems  Constitutional: Negative.   HENT: Positive for congestion, postnasal drip, sinus pressure and sore throat. Negative for ear pain.   Eyes: Negative.   Respiratory: Positive for cough.        Objective:   Physical Exam  Constitutional: He appears well-developed and well-nourished.  HENT:  Right Ear: External ear normal.  Left Ear: External ear normal.  Nose: Nose normal.  Mouth/Throat: Oropharynx is clear and moist.  Eyes: Conjunctivae are normal.  Pulmonary/Chest: Effort normal and breath sounds normal.  Lymphadenopathy:    He has no cervical adenopathy.          Assessment & Plan:  Sinusitis, treat with Amoxicillin.

## 2015-08-09 ENCOUNTER — Other Ambulatory Visit: Payer: Self-pay | Admitting: Family Medicine

## 2015-08-10 ENCOUNTER — Telehealth: Payer: Self-pay | Admitting: Family Medicine

## 2015-08-10 DIAGNOSIS — M5441 Lumbago with sciatica, right side: Secondary | ICD-10-CM

## 2015-08-10 NOTE — Telephone Encounter (Signed)
I spoke with pt and gave the below information. 

## 2015-08-10 NOTE — Telephone Encounter (Signed)
Patient Name: [name] DOB: 1940/07/26 Initial Comment caller states he has not slept - is having severe back pain- goes down into his left knee Nurse Assessment Nurse: Vallery Sa, RN, [place] Date/Time (Eastern Time): 08/10/2015 10:33:22 AM Confirm and document reason for call. If symptomatic, describe symptoms. You must click the next button to save text entered. ---Caller states he developed low back pain that goes into his left leg yesterday. No injury. No fever. Has the patient traveled out of the country within the last 30 days? ---No Does the patient have any new or worsening symptoms? ---Yes Will a triage be completed? ---Yes Related visit to physician within the last 2 weeks? ---No Does the PT have any chronic conditions? (i.e. diabetes, asthma, etc.) ---Yes List chronic conditions. ---Cardiac Stents, Heart attacks, Restless Leg Syndrome Is this a behavioral health or substance abuse call? ---No Guidelines Guideline Title Affirmed Question Affirmed Notes Back Pain [1] SEVERE back pain (e.g., excruciating) AND [2] sudden onset AND [3] age > 96 Final Disposition User Go to ED Now Vallery Sa, [place], [place] Disagree/Comply: [facility]

## 2015-08-10 NOTE — Telephone Encounter (Signed)
Patient would like a referral for excruciating pain in his lower back and left leg.  He has taken hydrocodone and muscle relaxers but nothing is working.

## 2015-08-10 NOTE — Telephone Encounter (Signed)
Before we do anything else we need to see what is going on in is spine. I will order an MRI of the lumbar spine, and based on those results I can give him advice

## 2015-08-10 NOTE — Telephone Encounter (Signed)
[name] spoke with pt. Dr Sarajane Jews request X-ray

## 2015-08-11 ENCOUNTER — Ambulatory Visit
Admission: RE | Admit: 2015-08-11 | Discharge: 2015-08-11 | Disposition: A | Discharge status: Home / Self Care | Payer: Medicare Other | Source: Ambulatory Visit | Attending: Family Medicine | Admitting: Family Medicine

## 2015-08-11 ENCOUNTER — Telehealth: Payer: Self-pay | Admitting: Family Medicine

## 2015-08-11 DIAGNOSIS — M4806 Spinal stenosis, lumbar region: Secondary | ICD-10-CM | POA: Diagnosis not present

## 2015-08-11 DIAGNOSIS — M5441 Lumbago with sciatica, right side: Secondary | ICD-10-CM

## 2015-08-11 NOTE — Telephone Encounter (Signed)
Pt would like mri results. Pt is in pain and would like morphine or something. Pt is aware md out of office this afternoon. Pt is on  Oxycodone .

## 2015-08-12 MED ORDER — MORPHINE SULFATE 15 MG PO TABS: 15.0000 mg | ORAL_TABLET | Freq: Four times a day (QID) | ORAL | Status: DC | PRN

## 2015-08-12 NOTE — Telephone Encounter (Signed)
Patient notified that Rx is ready for pick up.

## 2015-08-12 NOTE — Telephone Encounter (Signed)
See my Result Note on the MRI. I wrote for some morphine he can try

## 2015-08-12 NOTE — Addendum Note (Signed)
Addended by: [name] A on: 08/12/2015 10:05 AM   Modules accepted: Orders

## 2015-08-12 NOTE — Telephone Encounter (Signed)
Patient notified of MRI results. Attempted to contact patient about Rx but was unable to reach. Will try again.

## 2015-08-16 ENCOUNTER — Other Ambulatory Visit: Payer: Self-pay | Admitting: Family Medicine

## 2015-08-17 NOTE — Telephone Encounter (Signed)
Not sure if pt is taking this? There is another medication listed in chart.

## 2015-08-19 DIAGNOSIS — M4806 Spinal stenosis, lumbar region: Secondary | ICD-10-CM | POA: Diagnosis not present

## 2015-09-22 ENCOUNTER — Telehealth: Payer: Self-pay | Admitting: *Deleted

## 2015-09-22 NOTE — Telephone Encounter (Signed)
Agree. cdm 

## 2015-09-22 NOTE — Telephone Encounter (Signed)
Pt here with wife for her appt with provider in office.  Karie Mainland in the lab that he had stopped his amlodipine because it was making him weak. I spoke with pt. He reports for the last month his tongue has felt burnt, his legs have been swelling and he feels weak. He is caregiver for his wife.  He tires walking to the kitchen. He felt the amlodipine was causing these problems so he stopped it about 6 days ago. He reports he now "feels a lot better than I was."  Burning in mouth and weakness improved. Still with swelling in ankles.  Had one episode of chest pain recently which was relieved with one NTG.  Pt is due for follow up with Dr. [name].  Appt made for pt to see Dr. [name] on March 6,2017 at 4:15.  Pt instructed to go to ED if symptoms worsen prior to this appt.

## 2015-09-26 ENCOUNTER — Ambulatory Visit (INDEPENDENT_AMBULATORY_CARE_PROVIDER_SITE_OTHER): Payer: Medicare Other | Admitting: Cardiovascular Disease

## 2015-09-26 ENCOUNTER — Encounter: Payer: Self-pay | Admitting: Cardiovascular Disease

## 2015-09-26 VITALS — BP 122/70 | HR 73 | Ht 70.0 in | Wt 241.2 lb

## 2015-09-26 DIAGNOSIS — R609 Edema, unspecified: Secondary | ICD-10-CM | POA: Diagnosis not present

## 2015-09-26 DIAGNOSIS — I25118 Atherosclerotic heart disease of native coronary artery with other forms of angina pectoris: Secondary | ICD-10-CM | POA: Diagnosis not present

## 2015-09-26 DIAGNOSIS — R5383 Other fatigue: Secondary | ICD-10-CM | POA: Diagnosis not present

## 2015-09-26 DIAGNOSIS — E785 Hyperlipidemia, unspecified: Secondary | ICD-10-CM

## 2015-09-26 MED ORDER — FUROSEMIDE 20 MG PO TABS: ORAL_TABLET | ORAL | Status: DC

## 2015-09-26 NOTE — Progress Notes (Signed)
Chief Complaint  Patient presents with  . Follow-up    chest pain and SOB on exertion and exhaustion. swelling on right side  . Coronary Artery Disease     History of Present Illness: 75 yo WM with history of CAD, HTN, HLD, OSA, bradycardia here today for cardiac follow up. He has been followed in the past by Dr. [name]. Catheterization in 2009 showed nonobstructive CAD (50% LAD stenosis, 40% RCA stenosis). October 2011 he was admitted from the GI office was in acute GI bleeding. He required transfusions. He was seen by Dr. Sarajane Jews on 05/23/11 and was started on lasix for lower extremity edema and SOB. His breathing was much better on lasix. His lower extremity edema resolved. Echocardiogram November 2012 showed normal LV function and normal wall motion. He does not tolerate statins. He was seen in the ED on 08/19/12 with chest pain by Dr. Haroldine Laws and sent home with plans for outpatient f/u. Troponin was normal and EKG was unchanged. He had been arguing with a friend that day. He began to have pain that lasted for 4 hours. The pain was severe. He is intolerant of statins. I saw him 08/27/12 in the office and arranged a cardiac cath on 09/08/12.  He was found to have mild to moderate non-obstructive CAD. No PCI was needed. His right groin was very painful after the cath. Arterial doppler without any evidence of pseudoaneurysm or AV fistula. Admitted 4/25-4/27/16 with  unstable angina. CEs remained neg. LHC demonstrated severe RPDA disease and borderline mid LAD disease. FFR of mid LAD disease demonstrated hemodynamic significance. He underwent PCI with Xience DES to RPDA and LAD.Readmitted May 2016 with chest pain and dyspnea. Relook cath may 2016 with patent stents. Brilinta changed to Plavix and symtpoms resolved. Holter monitor June 2016 with PACs, PVCs, sinus. Called our office last week with c/o tongue burning, fatigue.  He is here today for follow up.  He has rare chest pains. He describes  feeling fatigued. He has no energy. He has held his Synthroid on his own. No SOB. He is having back pain and abdominal pain.   Primary Care Physician: [name]   Past Medical History  Diagnosis Date  . Anemia   . Hyperlipidemia   . Hypertension   . Allergy   . Myocardial infarction ([place])   . GERD (gastroesophageal reflux disease)   . Restless leg syndrome   . Hypothyroidism   . Headache(784.0)   . Obstructive sleep apnea     pt refused  . CAD (coronary artery disease)     a. Nonobst disease by cath 2009. b. Cath 08/2012: moderate nonobstructive CAD - 50% prox LAD, 30% mLAD, 30% OM, 40% mRCA, EF 50-55%.;  c. USA>> s/p DES to RPDA, DES to mLAD;  d. LHC 5/16:  pRCA 77, mRCA 36, m-dRCA 30, RCA stent ok, mCFX 30, ost-prox LAD 50, dLAD, LAD stent ok   . Gastritis 2011  . Bronchitis   . Back pain     low  . Diverticulosis   . Adenomatous polyp of colon 2007  . AVM (arteriovenous malformation) 2011    a. S/p argon plasma coagulation and ablation in 2011.  . Statin intolerance   . Bradycardia     a. H/o almost 7sec pause nocturnally during 2011 admission. Also has h/o fatigue with BB.    Past Surgical History  Procedure Laterality Date  . Appendectomy    . Hernia repair    . Tonsillectomy    .  Ankle surgery Left   . Colonoscopy  08-23-05    per Dr. Deatra Ina, adenomatous polyps, repeat in 5 yrs   . Esophagogastroduodenoscopy  08-23-05    per Dr. Deatra Ina, cauterized jejunal AVMs   . Skin graft Right     leg  . Left heart catheterization with coronary angiogram N/A 09/08/2012    Procedure: LEFT HEART CATHETERIZATION WITH CORONARY ANGIOGRAM;  Surgeon: Burnell Blanks, MD;  Location: [facility] CATH LAB;  Service: Cardiovascular;  Laterality: N/A;  . Left heart catheterization with coronary angiogram N/A 11/16/2014    Procedure: LEFT HEART CATHETERIZATION WITH CORONARY ANGIOGRAM;  Surgeon: Leonie Man, MD;  Location: [facility] CATH LAB;  Service: Cardiovascular;  Laterality: N/A;  . Cardiac  catheterization N/A 11/26/2014    Procedure: Left Heart Cath and Coronary Angiography;  Surgeon: Burnell Blanks, MD;  Location: [facility] CV LAB;  Service: Cardiovascular;  Laterality: N/A;    Current Outpatient Prescriptions  Medication Sig Dispense Refill  . amoxicillin (AMOXIL) 875 MG tablet Take 1 tablet (875 mg total) by mouth 2 (two) times daily. 20 tablet 0  . Ascorbic Acid (VITAMIN C PO) Take 1 tablet by mouth daily as needed (flu like symptopms).    [name] aspirin 81 MG tablet Take 81 mg by mouth daily.      . clonazePAM (KLONOPIN) 0.5 MG tablet TAKE 1 TABLET THREE TIMES A DAY AS NEEDED 270 tablet 1  . clopidogrel (PLAVIX) 75 MG tablet Take 1 tablet (75 mg total) by mouth daily. 30 tablet 11  . diclofenac sodium (VOLTAREN) 1 % GEL Apply 1 application topically daily as needed (ankle pain). 5 Tube 10  . ezetimibe (ZETIA) 10 MG tablet Take 1 tablet (10 mg total) by mouth daily. 30 tablet 11  . fluticasone (FLONASE) 50 MCG/ACT nasal spray Place 1 spray into both nostrils daily. 48 g 3  . HYDROcodone-homatropine (HYDROMET) 5-1.5 MG/5ML syrup Take 5 mLs by mouth every 4 (four) hours as needed. 240 mL 0  . methocarbamol (ROBAXIN-750) 750 MG tablet Take 0.5 tablets (375 mg total) by mouth every 6 (six) hours as needed for muscle spasms. 120 tablet 5  . morphine (MSIR) 15 MG tablet Take 1 tablet (15 mg total) by mouth every 6 (six) hours as needed for severe pain. 60 tablet 0  . nitroGLYCERIN (NITROSTAT) 0.4 MG SL tablet PLACE 1 TABLET UNDER TONGUE EVERY 5 MINUTES AS NEEDED FOR CHEST PAIN 75 tablet 3  . omeprazole (PRILOSEC) 20 MG capsule TAKE ONE CAPSULE BY MOUTH TWICE A DAY 180 capsule 3  . oxyCODONE-acetaminophen (PERCOCET) 10-325 MG tablet Take 1 tablet by mouth daily as needed (moderate pain). 120 tablet 0  . pantoprazole (PROTONIX) 40 MG tablet Take 1 tablet (40 mg total) by mouth daily. 30 tablet 11  . ropinirole (REQUIP) 5 MG tablet TAKE 1 TABLET (5 MG TOTAL) BY MOUTH AT BEDTIME. 30  tablet 3  . Tetrahydrozoline HCl (MURINE FOR RED EYES OP) Place 1 drop into both eyes daily as needed (red eyes).    . furosemide (LASIX) 20 MG tablet Take one tablet by mouth daily as needed for swelling 15 tablet 6   No current facility-administered medications for this visit.    Allergies  Allergen Reactions  . Brilinta [Ticagrelor] Shortness Of Breath  . Metoprolol Tartrate     Sever chest pains " flat lined patient"  . Shellfish Allergy Anaphylaxis  . Atorvastatin     myalgias  . Lipitor [Atorvastatin Calcium]     myalgias  .  Simvastatin     myalgias  . Trazodone And Nefazodone     Unsteady on feet  . Zolpidem     Chest pain  . Amoxicillin-Pot Clavulanate Rash    States recently prescribed and had no reaction when taken    Social History   Social History  . Marital Status: Married    Spouse Name: N/A  . Number of Children: 1  . Years of Education: N/A   Occupational History  . Disabled    Social History Main Topics  . Smoking status: Former Smoker -- 2.00 packs/day for 50 years    Types: Cigarettes    Quit date: 07/23/2002  . Smokeless tobacco: Never Used  . Alcohol Use: No  . Drug Use: No  . Sexual Activity: Not on file   Other Topics Concern  . Not on file   Social History Narrative    Family History  Problem Relation Age of Onset  . Heart disease Father   . Stroke Father   . Leukemia Mother   . Alcoholism Paternal Uncle   . Alcoholism Maternal Grandfather   . Heart attack Father     Review of Systems:  As stated in the HPI and otherwise negative.   BP 122/70 mmHg  Pulse 73  Ht 5\' 10"  (1.778 m)  Wt 241 lb 3.2 oz (109.408 kg)  BMI 34.61 kg/m2  SpO2 95%  Physical Examination: General: Well developed, well nourished, NAD HEENT: OP clear, mucus membranes moist SKIN: warm, dry. No rashes. Neuro: No focal deficits Musculoskeletal: Muscle strength 5/5 all ext Psychiatric: Mood and affect normal Neck: No JVD, no carotid bruits, no  thyromegaly, no lymphadenopathy. Lungs:Clear bilaterally, no wheezes, rhonci, crackles Cardiovascular: Regular rate and rhythm. No murmurs, gallops or rubs. Abdomen:Soft. Bowel sounds present. Non-tender.  Extremities: No lower extremity edema. Pulses are 2 + in the bilateral DP/PT.  Cardiac cath 11/26/14: Left Anterior Descending   . Ost LAD to Prox LAD lesion, 50% stenosed. discrete . The lesion was not previously treated.   . Prox LAD to Mid LAD lesion, 0% stenosed. Previously placed Prox LAD to Mid LAD stent (unknown type) is patent.   Jorene Minors LAD lesion, 25% stenosed.      Left Circumflex   . Mid Cx lesion, 30% stenosed.     Right Coronary Artery   . Prox RCA lesion, 20% stenosed.   . Mid RCA lesion, 40% stenosed.   . Mid RCA to Dist RCA lesion, 30% stenosed.   . Right Posterior Descending Artery   . RPDA lesion, 0% stenosed. Previously placed RPDA drug eluting stent is patent.     EKG:  EKG is ordered today. The ekg ordered today demonstrates Sinus rate 73 bpm. PVC, PAC  Recent Labs: 11/15/2014: TSH 0.277* 11/26/2014: B Natriuretic Peptide 21.3; BUN 17; Creatinine, Ser 1.11; Potassium 4.0; Sodium 138 11/27/2014: Hemoglobin 13.5; Platelets 354 05/16/2015: ALT 16   Lipid Panel    Component Value Date/Time   CHOL 138 05/16/2015 0732   TRIG 154* 05/16/2015 0732   HDL 32* 05/16/2015 0732   CHOLHDL 4.3 05/16/2015 0732   VLDL 31* 05/16/2015 0732   LDLCALC 75 05/16/2015 0732     Wt Readings from Last 3 Encounters:  09/26/15 241 lb 3.2 oz (109.408 kg)  08/11/15 230 lb (104.327 kg)  07/27/15 226 lb 4.8 oz (102.649 kg)     Other studies Reviewed: Additional studies/ records that were reviewed today include: . Review of the above records demonstrates:  Assessment and Plan:   1. CAD: Stable. PCI of the LAD and PDA in April 2016 with DES placed in both vessels. Relook cath May 2016 with patent stents. He did not tolerate Brilinta. Now on ASA and Plavix. He is not on a beta  blocker due to bradycardia.   2. Chronic diastolic CHF/Lower ext edema: Mild edema. He is now off of Norvasc. I will give him Lasix 20 mg to use prn.   3. Hyperlipidemia: He is intolerant of statins. (Crestor, lipitor, Zocor). He has been seen in lipid clinic and since his LDL is near goal, he is trying lifestyle modification.   4. Fatigue: He has stopped his Synthroid on his own. I have asked him to follow up in primary care. He will need to restart Synthroid and have dosing based on TSH level.  Current medicines are reviewed at length with the patient today.  The patient does not have concerns regarding medicines.  The following changes have been made:  no change  Labs/ tests ordered today include:   Orders Placed This Encounter  Procedures  . EKG 12-Lead     Disposition:   FU with me in 6 months   Signed, [name], MD 09/26/2015 7:51 PM    [facility] [place], [place], [place]  [address] Phone: [phone]; Fax: [phone]

## 2015-09-26 NOTE — Patient Instructions (Addendum)
Medication Instructions:  Your physician recommends that you continue on your current medications as directed. Please refer to the Current Medication list given to you.  today. May take furosemide 20 mg daily as needed for swelling.    Labwork: none  Testing/Procedures: none  Follow-Up: Your physician wants you to follow-up in: 6 months.  You will receive a reminder letter in the mail two months in advance. If you don't receive a letter, please call our office to schedule the follow-up appointment.   Any Other Special Instructions Will Be Listed Below (If Applicable).     If you need a refill on your cardiac medications before your next appointment, please call your pharmacy.

## 2015-09-30 ENCOUNTER — Encounter: Payer: Self-pay | Admitting: Family Medicine

## 2015-09-30 ENCOUNTER — Ambulatory Visit (INDEPENDENT_AMBULATORY_CARE_PROVIDER_SITE_OTHER): Payer: Medicare Other | Admitting: Family Medicine

## 2015-09-30 VITALS — BP 119/72 | HR 70 | Temp 97.8°F | Ht 70.0 in | Wt 236.0 lb

## 2015-09-30 DIAGNOSIS — I10 Essential (primary) hypertension: Secondary | ICD-10-CM | POA: Diagnosis not present

## 2015-09-30 DIAGNOSIS — E039 Hypothyroidism, unspecified: Secondary | ICD-10-CM

## 2015-09-30 DIAGNOSIS — R5382 Chronic fatigue, unspecified: Secondary | ICD-10-CM

## 2015-09-30 DIAGNOSIS — D62 Acute posthemorrhagic anemia: Secondary | ICD-10-CM

## 2015-09-30 DIAGNOSIS — I25118 Atherosclerotic heart disease of native coronary artery with other forms of angina pectoris: Secondary | ICD-10-CM | POA: Diagnosis not present

## 2015-09-30 LAB — CBC WITH DIFFERENTIAL/PLATELET
BASOS ABS: 0.1 10*3/uL (ref 0.0–0.1)
Basophils Relative: 0.9 % (ref 0.0–3.0)
EOS ABS: 0.5 10*3/uL (ref 0.0–0.7)
Eosinophils Relative: 5.2 % — ABNORMAL HIGH (ref 0.0–5.0)
HCT: 30.4 % — ABNORMAL LOW (ref 39.0–52.0)
Hemoglobin: 9.3 g/dL — ABNORMAL LOW (ref 13.0–17.0)
LYMPHS ABS: 2.4 10*3/uL (ref 0.7–4.0)
Lymphocytes Relative: 26.2 % (ref 12.0–46.0)
MCHC: 30.7 g/dL (ref 30.0–36.0)
MCV: 69.7 fl — ABNORMAL LOW (ref 78.0–100.0)
MONO ABS: 0.9 10*3/uL (ref 0.1–1.0)
MONOS PCT: 9.4 % (ref 3.0–12.0)
NEUTROS PCT: 58.3 % (ref 43.0–77.0)
Neutro Abs: 5.3 10*3/uL (ref 1.4–7.7)
Platelets: 350 10*3/uL (ref 150.0–400.0)
RBC: 4.36 Mil/uL (ref 4.22–5.81)
RDW: 17.7 % — ABNORMAL HIGH (ref 11.5–15.5)
WBC: 9.2 10*3/uL (ref 4.0–10.5)

## 2015-09-30 LAB — POC URINALSYSI DIPSTICK (AUTOMATED)
BILIRUBIN UA: NEGATIVE
Blood, UA: NEGATIVE
GLUCOSE UA: NEGATIVE
KETONES UA: NEGATIVE
LEUKOCYTES UA: NEGATIVE
NITRITE UA: NEGATIVE
PROTEIN UA: NEGATIVE
Spec Grav, UA: >=1.03
UROBILINOGEN UA: 0.2
pH, UA: 5.5

## 2015-09-30 LAB — HEPATIC FUNCTION PANEL
ALBUMIN: 4.3 g/dL (ref 3.5–5.2)
ALT: 14 U/L (ref 0–53)
AST: 18 U/L (ref 0–37)
Alkaline Phosphatase: 58 U/L (ref 39–117)
Bilirubin, Direct: 0.2 mg/dL (ref 0.0–0.3)
TOTAL PROTEIN: 6.6 g/dL (ref 6.0–8.3)
Total Bilirubin: 0.8 mg/dL (ref 0.2–1.2)

## 2015-09-30 LAB — BASIC METABOLIC PANEL
BUN: 13 mg/dL (ref 6–23)
CO2: 27 mEq/L (ref 19–32)
Calcium: 9.2 mg/dL (ref 8.4–10.5)
Chloride: 102 mEq/L (ref 96–112)
Creatinine, Ser: 1.13 mg/dL (ref 0.40–1.50)
GFR: 67.43 mL/min (ref >60.00)
GLUCOSE: 137 mg/dL — AB (ref 70–99)
POTASSIUM: 4.2 meq/L (ref 3.5–5.1)
Sodium: 139 mEq/L (ref 135–145)

## 2015-09-30 LAB — T4, FREE: Free T4: 0.34 ng/dL — ABNORMAL LOW (ref 0.60–1.60)

## 2015-09-30 LAB — TSH: TSH: 48.88 u[IU]/mL — AB (ref 0.35–4.50)

## 2015-09-30 LAB — T3, FREE: T3, Free: 2 pg/mL — ABNORMAL LOW (ref 2.3–4.2)

## 2015-09-30 MED ORDER — OXYCODONE-ACETAMINOPHEN 10-325 MG PO TABS: 1.0000 | ORAL_TABLET | Freq: Every day | ORAL | Status: DC | PRN

## 2015-09-30 NOTE — Progress Notes (Signed)
   Subjective:    Patient ID: ROC TEEM, male    DOB: April 20, 1941, 75 y.o.   MRN: [id number]  HPI Here to discuss chronic fatigue and stress. He is very stressed over his wife's health problems and he has become the total care giver for her. He does all the cooking, cleaning, etc now and  this is wearing him down.    Review of Systems  Constitutional: Positive for fatigue.  Respiratory: Negative.   Cardiovascular: Negative.   Neurological: Negative.        Objective:   Physical Exam  Constitutional: He is oriented to person, place, and time. He appears well-developed and well-nourished.  Cardiovascular: Normal rate, regular rhythm, normal heart sounds and intact distal pulses.   Pulmonary/Chest: Effort normal and breath sounds normal.  Neurological: He is alert and oriented to person, place, and time.          Assessment & Plan:  I think most of his fatigue is from stress and lack of sleep. We will check labs to look for other etiologies as well.

## 2015-09-30 NOTE — Progress Notes (Signed)
Pre visit review using our clinic review tool, if applicable. No additional management support is needed unless otherwise documented below in the visit note. 

## 2015-10-04 ENCOUNTER — Telehealth: Payer: Self-pay | Admitting: Family Medicine

## 2015-10-04 MED ORDER — LEVOTHYROXINE SODIUM 75 MCG PO TABS: 75.0000 ug | ORAL_TABLET | Freq: Every day | ORAL | Status: DC

## 2015-10-04 NOTE — Telephone Encounter (Signed)
I spoke with pt and gave results.  

## 2015-10-04 NOTE — Telephone Encounter (Signed)
Pt would like results of labs. Please call back. °

## 2015-10-04 NOTE — Addendum Note (Signed)
Addended by: Aggie Hacker A on: 10/04/2015 04:17 PM   Modules accepted: Orders

## 2015-10-04 NOTE — Addendum Note (Signed)
Addended by: [name] A on: 10/04/2015 03:27 PM   Modules accepted: Orders

## 2015-10-07 ENCOUNTER — Other Ambulatory Visit: Payer: Self-pay | Admitting: Family Medicine

## 2015-10-07 MED ORDER — OMEPRAZOLE 20 MG PO CPDR: 20.0000 mg | DELAYED_RELEASE_CAPSULE | Freq: Two times a day (BID) | ORAL | Status: DC

## 2015-10-07 MED ORDER — CLONAZEPAM 0.5 MG PO TABS: 0.5000 mg | ORAL_TABLET | Freq: Three times a day (TID) | ORAL | Status: DC | PRN

## 2015-10-07 MED ORDER — PANTOPRAZOLE SODIUM 40 MG PO TBEC: 40.0000 mg | DELAYED_RELEASE_TABLET | Freq: Every day | ORAL | Status: DC

## 2015-10-07 MED ORDER — ROPINIROLE HCL 5 MG PO TABS: ORAL_TABLET | ORAL | Status: DC

## 2015-10-07 MED ORDER — CLOPIDOGREL BISULFATE 75 MG PO TABS: 75.0000 mg | ORAL_TABLET | Freq: Every day | ORAL | Status: DC

## 2015-10-07 MED ORDER — FUROSEMIDE 20 MG PO TABS: ORAL_TABLET | ORAL | Status: DC

## 2015-10-12 ENCOUNTER — Other Ambulatory Visit (INDEPENDENT_AMBULATORY_CARE_PROVIDER_SITE_OTHER): Payer: Medicare Other

## 2015-10-12 ENCOUNTER — Encounter: Payer: Self-pay | Admitting: Gastroenterology

## 2015-10-12 ENCOUNTER — Ambulatory Visit (INDEPENDENT_AMBULATORY_CARE_PROVIDER_SITE_OTHER): Payer: Medicare Other | Admitting: Gastroenterology

## 2015-10-12 ENCOUNTER — Telehealth: Payer: Self-pay

## 2015-10-12 VITALS — BP 126/72 | HR 64 | Ht 69.25 in | Wt 241.4 lb

## 2015-10-12 DIAGNOSIS — D509 Iron deficiency anemia, unspecified: Secondary | ICD-10-CM

## 2015-10-12 DIAGNOSIS — Z860100 Personal history of colon polyps, unspecified: Secondary | ICD-10-CM | POA: Insufficient documentation

## 2015-10-12 DIAGNOSIS — Z8774 Personal history of (corrected) congenital malformations of heart and circulatory system: Secondary | ICD-10-CM | POA: Insufficient documentation

## 2015-10-12 DIAGNOSIS — K921 Melena: Secondary | ICD-10-CM | POA: Diagnosis not present

## 2015-10-12 DIAGNOSIS — R109 Unspecified abdominal pain: Secondary | ICD-10-CM

## 2015-10-12 DIAGNOSIS — Z8679 Personal history of other diseases of the circulatory system: Secondary | ICD-10-CM

## 2015-10-12 DIAGNOSIS — I25110 Atherosclerotic heart disease of native coronary artery with unstable angina pectoris: Secondary | ICD-10-CM

## 2015-10-12 LAB — CBC WITH DIFFERENTIAL/PLATELET
BASOS ABS: 0.1 10*3/uL (ref 0.0–0.1)
BASOS PCT: 0.7 % (ref 0.0–3.0)
Eosinophils Absolute: 0.6 10*3/uL (ref 0.0–0.7)
Eosinophils Relative: 5.3 % — ABNORMAL HIGH (ref 0.0–5.0)
HEMATOCRIT: 31.1 % — AB (ref 39.0–52.0)
Hemoglobin: 9.5 g/dL — ABNORMAL LOW (ref 13.0–17.0)
LYMPHS PCT: 24.7 % (ref 12.0–46.0)
Lymphs Abs: 2.7 10*3/uL (ref 0.7–4.0)
MCHC: 30.5 g/dL (ref 30.0–36.0)
MCV: 69.2 fl — AB (ref 78.0–100.0)
MONOS PCT: 11.3 % (ref 3.0–12.0)
Monocytes Absolute: 1.3 10*3/uL — ABNORMAL HIGH (ref 0.1–1.0)
NEUTROS ABS: 6.4 10*3/uL (ref 1.4–7.7)
Neutrophils Relative %: 58 % (ref 43.0–77.0)
PLATELETS: 373 10*3/uL (ref 150.0–400.0)
RBC: 4.5 Mil/uL (ref 4.22–5.81)
RDW: 18.1 % — ABNORMAL HIGH (ref 11.5–15.5)
WBC: 11.1 10*3/uL — ABNORMAL HIGH (ref 4.0–10.5)

## 2015-10-12 NOTE — Telephone Encounter (Signed)
Is this in reference to Kimberlee Nearing or the patient listed in the letter, [place]?   Darlina Guys

## 2015-10-12 NOTE — Progress Notes (Signed)
10/12/2015 [name] [id number] 1941-01-09   HISTORY OF PRESENT ILLNESS:  This is a 75 year old male who is previously known to Dr. Deatra Ina.  Has history of small bowel/jejunal AVM's that were APC'ed in 2007 and 2011.  Also has history of adenomatous polyps (2) on last colonoscopy in 08/2005.  He was referred here today by his PCP, Dr. Sarajane Jews, for evaluation of anemia.  Hgb was 9.3 grams a couple of weeks ago and 10 months prior to that it was 13.5 grams.  He does admit to some intermittent dark to black stools.  Denies any red blood.  Has CAD disease and had drug eluting stents placed 11/2014 so is on Plavix for that.  Also reports that he feels like his abdomen bulges out on the right side and is sore on that same side when he lays on it, etc.  Says that this has been present for at least 3 years and CT scan abdomen and pelvis with contrast in 02/2013 for the same complaints was unremarkable.  Is on daily PPI.   Past Medical History  Diagnosis Date  . Anemia   . Hyperlipidemia   . Hypertension   . Allergy   . Myocardial infarction ([place])   . GERD (gastroesophageal reflux disease)   . Restless leg syndrome   . Hypothyroidism   . Headache(784.0)   . Obstructive sleep apnea     pt refused  . CAD (coronary artery disease)     a. Nonobst disease by cath 2009. b. Cath 08/2012: moderate nonobstructive CAD - 50% prox LAD, 30% mLAD, 30% OM, 40% mRCA, EF 50-55%.;  c. USA>> s/p DES to RPDA, DES to mLAD;  d. LHC 5/16:  pRCA 35, mRCA 1, m-dRCA 30, RCA stent ok, mCFX 30, ost-prox LAD 50, dLAD, LAD stent ok   . Gastritis 2011  . Bronchitis   . Back pain     low  . Diverticulosis   . Adenomatous polyp of colon 2007  . AVM (arteriovenous malformation) 2011    a. S/p argon plasma coagulation and ablation in 2011.  . Statin intolerance   . Bradycardia     a. H/o almost 7sec pause nocturnally during 2011 admission. Also has h/o fatigue with BB.   Past Surgical History  Procedure Laterality  Date  . Appendectomy    . Hernia repair    . Tonsillectomy    . Ankle surgery Left   . Colonoscopy  08-23-05    per Dr. Deatra Ina, adenomatous polyps, repeat in 5 yrs   . Esophagogastroduodenoscopy  08-23-05    per Dr. Deatra Ina, cauterized jejunal AVMs   . Skin graft Right     leg  . Left heart catheterization with coronary angiogram N/A 09/08/2012    Procedure: LEFT HEART CATHETERIZATION WITH CORONARY ANGIOGRAM;  Surgeon: Burnell Blanks, MD;  Location: [facility] CATH LAB;  Service: Cardiovascular;  Laterality: N/A;  . Left heart catheterization with coronary angiogram N/A 11/16/2014    Procedure: LEFT HEART CATHETERIZATION WITH CORONARY ANGIOGRAM;  Surgeon: Leonie Man, MD;  Location[facility] CATH LAB;  Service: Cardiovascular;  Laterality: N/A;  . Cardiac catheterization N/A 11/26/2014    Procedure: Left Heart Cath and Coronary Angiography;  Surgeon: Burnell Blanks, MD;  Location: [place] CV LAB;  Service: Cardiovascular;  Laterality: N/A;    reports that he quit smoking about 13 years ago. His smoking use included Cigarettes. He has a 100 pack-year smoking history. He has never used  smokeless tobacco. He reports that he does not drink alcohol or use illicit drugs. family history includes Alcoholism in his maternal grandfather and paternal uncle; Heart attack in his father; Heart disease in his father; Leukemia in his mother; Stroke in his father. Allergies  Allergen Reactions  . Brilinta [Ticagrelor] Shortness Of Breath  . Metoprolol Tartrate     Sever chest pains " flat lined patient"  . Shellfish Allergy Anaphylaxis  . Atorvastatin     myalgias  . Lipitor [Atorvastatin Calcium]     myalgias  . Simvastatin     myalgias  . Trazodone And Nefazodone     Unsteady on feet  . Zolpidem     Chest pain  . Amoxicillin-Pot Clavulanate Rash    States recently prescribed and had no reaction when taken      Outpatient Encounter Prescriptions as of 10/12/2015  Medication Sig  . Ascorbic  Acid (VITAMIN C PO) Take 1 tablet by mouth daily as needed (flu like symptopms).  [name] aspirin 81 MG tablet Take 81 mg by mouth daily.    . clonazePAM (KLONOPIN) 0.5 MG tablet Take 1 tablet (0.5 mg total) by mouth 3 (three) times daily as needed.  . clopidogrel (PLAVIX) 75 MG tablet Take 1 tablet (75 mg total) by mouth daily.  . diclofenac sodium (VOLTAREN) 1 % GEL Apply 1 application topically daily as needed (ankle pain).  [name] ezetimibe (ZETIA) 10 MG tablet Take 1 tablet (10 mg total) by mouth daily.  . fluticasone (FLONASE) 50 MCG/ACT nasal spray Place 1 spray into both nostrils daily.  . furosemide (LASIX) 20 MG tablet Take one tablet by mouth daily as needed for swelling  . levothyroxine (SYNTHROID, LEVOTHROID) 75 MCG tablet Take 1 tablet (75 mcg total) by mouth daily.  . methocarbamol (ROBAXIN-750) 750 MG tablet Take 0.5 tablets (375 mg total) by mouth every 6 (six) hours as needed for muscle spasms.  [name] morphine (MSIR) 15 MG tablet Take 1 tablet (15 mg total) by mouth every 6 (six) hours as needed for severe pain.  . nitroGLYCERIN (NITROSTAT) 0.4 MG SL tablet PLACE 1 TABLET UNDER TONGUE EVERY 5 MINUTES AS NEEDED FOR CHEST PAIN  . omeprazole (PRILOSEC) 20 MG capsule Take 1 capsule (20 mg total) by mouth 2 (two) times daily.  [name] oxyCODONE-acetaminophen (PERCOCET) 10-325 MG tablet Take 1 tablet by mouth daily as needed (moderate pain).  . pantoprazole (PROTONIX) 40 MG tablet Take 1 tablet (40 mg total) by mouth daily.  . ropinirole (REQUIP) 5 MG tablet TAKE 1 TABLET (5 MG TOTAL) BY MOUTH AT BEDTIME.  [name] Tetrahydrozoline HCl (MURINE FOR RED EYES OP) Place 1 drop into both eyes daily as needed (red eyes).  . [DISCONTINUED] amoxicillin (AMOXIL) 875 MG tablet Take 1 tablet (875 mg total) by mouth 2 (two) times daily.  . [DISCONTINUED] HYDROcodone-homatropine (HYDROMET) 5-1.5 MG/5ML syrup Take 5 mLs by mouth every 4 (four) hours as needed.   No facility-administered encounter medications on file as of  10/12/2015.     REVIEW OF SYSTEMS  : All other systems reviewed and negative except where noted in the History of Present Illness.   PHYSICAL EXAM: BP 126/72 mmHg  Pulse 64  Ht 5' 9.25" (1.759 m)  Wt 241 lb 6 oz (109.487 kg)  BMI 35.39 kg/m2 General: Well developed white male in no acute distress Head: Normocephalic and atraumatic Eyes:  Sclerae anicteric, conjunctiva pink. Ears: Normal auditory acuity Lungs: Clear throughout to auscultation Heart: Regular rate and rhythm Abdomen: Soft,  non-distended.  Normal bowel sounds.  Mild right sided TTP. Musculoskeletal: Symmetrical with no gross deformities  Skin: No lesions on visible extremities Extremities: No edema  Neurological: Alert oriented x 4, grossly non-focal Psychological:  Alert and cooperative. Normal mood and affect  ASSESSMENT AND PLAN: -Microcytic anemia:  Hgb down 4-5 grams over the past 10 months.  Will recheck it today due to complaints of severe fatigue/lack or energy.  Has intermittent black stools. -History of small bowel (jejunal) AVM's with APC in 2007 and 2011. -Personal history of adenomatous polyps:  Last colonoscopy 08/2005 at which time he had 2 adenomatous polyps removed. -Right sided abdominal pain:  Also patient feels like right side of abdomen is "bulging".  These symptoms have been present for years and CT scan 02/2013 for the same complaints was negative. -CAD with DES placed in 11/2014, now on Plavix, prescribed by Dr. Julianne Handler.  *Needs EGD/enteroscopy and colonoscopy with possible APC with Dr. Loletha Carrow, which will need to be performed at [facility] hospital.  The risks, benefits, and alternatives to EGD/enteroscopy and colonoscopy were discussed with the patient and he consents to proceed.  *Hold Plavix for 5 days before procedure - will instruct when and how to resume after procedure. Risks and benefits of procedure including bleeding, perforation, infection, missed lesions, medication reactions and possible  hospitalization or surgery if complications occur explained. Additional rare but real risk of cardiovascular event such as heart attack or ischemia/infarct of other organs off of Plavix explained and need to seek urgent help if this occurs. Will communicate by phone or EMR with patient's prescribing provider, Dr. Julianne Handler, to confirm that holding Plavix is reasonable in this case.   CC:  [name], MD

## 2015-10-12 NOTE — Patient Instructions (Signed)
Your physician has requested that you go to the basement for the following lab work before leaving today: CBC/diff   You have been scheduled for an endoscopy and colonoscopy. Please follow the written instructions given to you at your visit today. Please pick up your prep supplies at the pharmacy. If you use inhalers (even only as needed), please bring them with you on the day of your procedure.    I appreciate the opportunity to care for you.

## 2015-10-12 NOTE — Telephone Encounter (Signed)
[facility] [address] [place] [address]  10/12/2015   RE: [name] DOB: 06/28/1930 MRN: [id number]   Dear Rozann Lesches MD,    We have scheduled the above patient for an endoscopic procedure. Our records show that she is on anticoagulation therapy.   Please advise as to how long the patient may come off her therapy of Eliquis  prior to the EGD procedure, which is scheduled for 10/24/15.     Please fax back/ or route the completed form to [employer] [place], [place] at [phone].   Sincerely,    Silvano Rusk, MD

## 2015-10-13 ENCOUNTER — Telehealth: Payer: Self-pay

## 2015-10-13 NOTE — Telephone Encounter (Signed)
Routed correct letter to Dr Julianne Handler for plavix clearance.

## 2015-10-13 NOTE — Telephone Encounter (Signed)
Patient informed to hold Plavix 7 days prior to procedure and he verbalized understanding.

## 2015-10-13 NOTE — Telephone Encounter (Signed)
[name] - 10/13/15 >','<< Less Detail',event)" href="javascript:;">More Detail >>   Christopher Blanks, [name]   Sent: Thu October 13, 2015 12:13 PM    To: [name], CMA    Cc: Christopher Mayer, [name]        Message     [facility] for patient to hold Plavix for 7 days before planned procedure. I would not plan his procedure before April 2017 as his stent was placed in April 2016.         Darlina Guys        ----- Message -----     From: [name], CMA     Sent: 10/13/2015 11:04 AM      To: Christopher Blanks, [name]                  [facility] Size     Small Medium Large Extra Extra Large    [name]  10/13/2015  Telephone  MRN:  [id number]   Description: 75 year old male  Provider: Cherith Tewell E [name], [place]  Department: [facility]       Reason for Call     anti-coag clearance    Plavix         Call Documentation      Christopher Blanks, [name] at 10/13/2015 5:00 PM     Status: Signed       Expand All Collapse All   That will be great. [name]            [name], CMA at 10/13/2015 2:26 PM     Status: Signed       Expand All Collapse All   The procedure date is set up for 11/15/15 will that be ok Sir? Thank you for your time.            [name] E [name], CMA at 10/13/2015 10:57 AM     Status: Signed       Expand All Collapse All   Routed correct letter to Dr Julianne Handler for plavix clearance.             Encounter MyChart Messages     No messages in this encounter     Routing History     Priority Sent On From To Message Type     10/13/2015 5:00 PM Christopher Blanks, [name] [name] E [name], [place] Patient Calls     10/13/2015 2:27 PM [name] E [name], CMA Christopher Blanks, [name] Patient Calls     10/13/2015 11:04 AM [name], CMA Christopher Blanks, [name] Patient Calls      Created by     [name], [name] on  10/13/2015 10:56 AM     Visit Pharmacy     [id number] - [place], [place] - [address]

## 2015-10-13 NOTE — Telephone Encounter (Signed)
Sorry this is suppose to be on [name], will correct this and send you correct letter Sir.

## 2015-10-13 NOTE — Telephone Encounter (Signed)
The procedure date is set up for 11/15/15 will that be ok Sir?  Thank you for your time.

## 2015-10-13 NOTE — Telephone Encounter (Signed)
That will be great. [name]

## 2015-10-15 NOTE — Progress Notes (Signed)
[name],    I reviewed your office consult note, especially noting the report of a DES placed 11/2014.    I also reviewed the cardiac cath report from 11/2014, and I find it confusing.  The Conclusions at the top indicate "a drug eluting stent was placed", however, in the body of the report, it seems to indicate that there is at least one previously-placed DES.    Patty, please contact this patient's cardiologist ASAP to clarify.  If a DES was placed 11/2014, then the patient CANNOT stop his plavix for 12 months afterwards, precluding endoscopic procedures at this time. He would need support with iron treatment and (if Hgb drops below 8.0) periodic transfusion until he can be off plavix 5 days prior to procedures.  Also, he would need a CBC checked the week prior to procedures to be sure it is safe to proceed.

## 2015-10-17 ENCOUNTER — Telehealth: Payer: Self-pay

## 2015-10-17 DIAGNOSIS — D509 Iron deficiency anemia, unspecified: Secondary | ICD-10-CM

## 2015-10-17 NOTE — Telephone Encounter (Signed)
DES placed November 16, 2014. The cath report from may 2016 incorrectly defaulted to a stent in the findings but no DES was placed in May. (This is an error in reporting template when we are documenting old stents). Anti-platelet therapy can be held after April 2017. The doctor can also directly message me if there are questions. Thanks, Therapist, sports

## 2015-10-17 NOTE — Telephone Encounter (Signed)
[name],   I reviewed your office consult note, especially noting the report of a DES placed 11/2014.   I also reviewed the cardiac cath report from 11/2014, and I find it confusing. The Conclusions at the top indicate "a drug eluting stent was placed", however, in the body of the report, it seems to indicate that there is at least one previously-placed DES.   [name], please contact this patient's cardiologist ASAP to clarify. If a DES was placed 11/2014, then the patient CANNOT stop his plavix for 12 months afterwards, precluding endoscopic procedures at this time. He would need support with iron treatment and (if Hgb drops below 8.0) periodic transfusion until he can be off plavix 5 days prior to procedures. Also, he would need a CBC checked the week prior to procedures to be sure it is safe to proceed.

## 2015-10-17 NOTE — Telephone Encounter (Signed)
Dr [name] can you please help with this patient, Dr Loletha Carrow wants to clarify if a DES was placed 11/2014.  See the note attached in this message.  Thank you for your help.

## 2015-10-17 NOTE — Telephone Encounter (Signed)
-----   Message from Doran Stabler, MD sent at 10/15/2015  8:46 AM EDT -----   ----- Message -----    From: Loralie Champagne, PA-C    Sent: 10/12/2015  11:19 AM      To: Doran Stabler, MD

## 2015-10-17 NOTE — Telephone Encounter (Signed)
See response from Dr [name].

## 2015-10-18 NOTE — Telephone Encounter (Signed)
I reviewed the cardiologist's reply.  This patient's procedures must be after April of this year.  He should stay on aspirin, but stop plavix 5 days prior.  Please see my previous note - he needs to be on iron tablets if he is not already.  Check CBC in 3 weeks.

## 2015-10-19 NOTE — Telephone Encounter (Signed)
Pt appt has been rescheduled to 12/05/15 12 noon he will stay on asa and and stop plavix 5 days prior.  Also, he needs to be on iron daily and have repeat labs (cbc) in 3 weeks.   Left message on machine to call back instructions have been mailed to the home.

## 2015-10-20 NOTE — Telephone Encounter (Signed)
Left message on machine to call back  

## 2015-10-21 NOTE — Telephone Encounter (Signed)
The patient has been notified of this information and all questions answered.

## 2015-10-31 ENCOUNTER — Other Ambulatory Visit: Payer: Self-pay | Admitting: Neurosurgery

## 2015-10-31 DIAGNOSIS — M48061 Spinal stenosis, lumbar region without neurogenic claudication: Secondary | ICD-10-CM

## 2015-11-02 ENCOUNTER — Telehealth: Payer: Self-pay | Admitting: Cardiovascular Disease

## 2015-11-02 NOTE — Telephone Encounter (Signed)
New Message:   Sent fax over for clearance on yesterday,need this back asap. Pt is in so much pain,need procedure asap.

## 2015-11-02 NOTE — Telephone Encounter (Signed)
LM w/Caroline w/[facility].  Dr. [name] is not in office this week.  Sent request to [facility] that may hold his Plavix 5 days prior to epidural steroid injection and signed by Dr. [name] (DOD). Needs to restart Plavix after procedure.

## 2015-11-04 ENCOUNTER — Other Ambulatory Visit: Payer: Self-pay | Admitting: Family Medicine

## 2015-11-07 ENCOUNTER — Ambulatory Visit
Admission: RE | Admit: 2015-11-07 | Discharge: 2015-11-07 | Disposition: A | Discharge status: Home / Self Care | Payer: Medicare Other | Source: Ambulatory Visit | Attending: Neurosurgery | Admitting: Neurosurgery

## 2015-11-07 DIAGNOSIS — M4806 Spinal stenosis, lumbar region: Secondary | ICD-10-CM | POA: Diagnosis not present

## 2015-11-07 DIAGNOSIS — M48061 Spinal stenosis, lumbar region without neurogenic claudication: Secondary | ICD-10-CM

## 2015-11-07 MED ORDER — IOHEXOL 180 MG/ML  SOLN
1.0000 mL | Freq: Once | INTRAMUSCULAR | Status: AC | PRN
  Administered 2015-11-07: 1 mL via EPIDURAL

## 2015-11-07 MED ORDER — IOHEXOL 300 MG/ML  SOLN: 1.0000 mL | Freq: Once | INTRAMUSCULAR | Status: DC | PRN

## 2015-11-07 MED ORDER — TRIAMCINOLONE ACETONIDE 40 MG/ML IJ SUSP (RADIOLOGY): 60.0000 mg | Freq: Once | INTRAMUSCULAR | Status: DC

## 2015-11-07 MED ORDER — METHYLPREDNISOLONE ACETATE 40 MG/ML INJ SUSP (RADIOLOG
120.0000 mg | Freq: Once | INTRAMUSCULAR | Status: AC
Start: 2015-11-07 — End: 2015-11-07
  Administered 2015-11-07: 120 mg via EPIDURAL

## 2015-11-07 NOTE — Discharge Instructions (Signed)

## 2015-11-09 ENCOUNTER — Telehealth: Payer: Self-pay | Admitting: Family Medicine

## 2015-11-09 NOTE — Telephone Encounter (Signed)
Pt request refill of the following: morphine (MSIR) 15 MG tablet   Phamacy:

## 2015-11-10 MED ORDER — MORPHINE SULFATE 15 MG PO TABS: 15.0000 mg | ORAL_TABLET | Freq: Four times a day (QID) | ORAL | Status: DC | PRN

## 2015-11-10 NOTE — Telephone Encounter (Signed)
done

## 2015-11-10 NOTE — Telephone Encounter (Signed)
error 

## 2015-11-11 NOTE — Telephone Encounter (Signed)
Pt is aware.  

## 2015-11-21 ENCOUNTER — Telehealth: Payer: Self-pay | Admitting: Family Medicine

## 2015-11-21 NOTE — Telephone Encounter (Signed)
Pt would like to know if dr fry would recommend a good back brace for him. Pt had epidural at [facility] his back is better

## 2015-11-21 NOTE — Telephone Encounter (Signed)
Usually I recommend against using a back brace because it makes the core muscles weaker

## 2015-11-21 NOTE — Telephone Encounter (Signed)
I spoke with pt and went over below information. 

## 2015-11-29 ENCOUNTER — Encounter (HOSPITAL_COMMUNITY): Payer: Self-pay | Admitting: *Deleted

## 2015-11-30 ENCOUNTER — Telehealth: Payer: Self-pay | Admitting: Family Medicine

## 2015-11-30 MED ORDER — OMEPRAZOLE 20 MG PO CPDR: 20.0000 mg | DELAYED_RELEASE_CAPSULE | Freq: Two times a day (BID) | ORAL | Status: DC

## 2015-11-30 NOTE — Telephone Encounter (Signed)
He wants to switch from Pantoprazole back to Omeprazole

## 2015-12-01 ENCOUNTER — Telehealth: Payer: Self-pay | Admitting: Gastroenterology

## 2015-12-01 ENCOUNTER — Telehealth: Payer: Self-pay | Admitting: *Deleted

## 2015-12-01 ENCOUNTER — Ambulatory Visit: Payer: Medicare Other | Admitting: Family Medicine

## 2015-12-01 ENCOUNTER — Telehealth: Payer: Self-pay | Admitting: Cardiovascular Disease

## 2015-12-01 DIAGNOSIS — I499 Cardiac arrhythmia, unspecified: Secondary | ICD-10-CM

## 2015-12-01 NOTE — Telephone Encounter (Signed)
Patient should call cardiology for advise and patient is aware

## 2015-12-01 NOTE — Telephone Encounter (Signed)
I spoke with the pt and he states he has no money for the procedures, his wife has lung cancer and her insurance was cancelled during her radiation and they have spent all of their savings taking care of her.  I advised him that he would not have to pay anything the day of the procedure and he states his part is $275 dollars and he just can not afford it.  [employer] has a judgment against his home and his children have to buy their food.  He tried to get assistance from [employer] but because he has medicare he does not qualify.  Please advise

## 2015-12-01 NOTE — Telephone Encounter (Signed)
[place] - [phone].  [name] is at the home of the patient.  Patient has an irregular heart rate.  She suggests patient should be seen with an EKG.  She states that she does not feel as this is "urgent" enough for the ER.  FYI

## 2015-12-01 NOTE — Telephone Encounter (Signed)
New message      Patient c/o Palpitations:  High priority if patient c/o lightheadedness and shortness of breath.  1. How long have you been having palpitations? In the past 30 minutes  2. Are you currently experiencing lightheadedness and shortness of breath? No, no SOB until up walking  3. Have you checked your BP and heart rate? (document readings) b/p 130/67, irregular hr 64  4. Are you experiencing any other symptoms? no   Per pt states the home health just left after taking vital; sign the home health nurse stated the pt needed EKG.

## 2015-12-01 NOTE — Telephone Encounter (Signed)
He has had PACs, PVCs on monitor in 2016. It is likely that this is what he had during his [facility] visit. If he wishes to wear another 48 hour monitor, we can arrange that. Thanks, chris

## 2015-12-01 NOTE — Telephone Encounter (Signed)
Patient called to report that his [facility] RN was concerned when she checked his HR. When she started counting his pulse, it was 64 bpm. She then told him it "was racing for a few seconds," but did not tell the patient how fast. It quickly went back down to the 60s. The patient was asymptomatic the whole time. He denies CP, SOB, palpitations. The only reason he knew it was "racing" was because he felt it on his wrist. The patient checked HR out loud on the phone, and it sounded regular and in the 60s.  Patient has not been taking Plavix for 5 days and will not resume again until after his colonoscopy Tuesday. He is still taking his ASA. He understands he will be called back with Dr. Camillia Herter recommendations.

## 2015-12-01 NOTE — Telephone Encounter (Signed)
See my note to Sammuel Bailiff. I am not sure she is in the office 12/02/15. thanks

## 2015-12-02 NOTE — Telephone Encounter (Signed)
The pt was called and advised that Dr Loletha Carrow recommends he have the procedure as scheduled.. I explained the importance and advised him he would not need to pay anything the day of the procedure.  He verbalized understanding of the instructions and will call with any further concerns.  He did agree to remain on the schedule and have the procedures as planned.

## 2015-12-02 NOTE — Telephone Encounter (Signed)
I am sorry to hear that finances are an obstacle to his medical care.  While I understand that [facility] financial policies may be the overriding issue, I am ready and willing to do the procedures next week regardless of ability to pay.  However, we would need his final answer today because if he is not going to proceed, then he should not stop the plavix as scheduled and we could possibly put another patient in that EGD/colon time slot next week.  - HD

## 2015-12-02 NOTE — Telephone Encounter (Signed)
Left message to call back  

## 2015-12-02 NOTE — Telephone Encounter (Signed)
Patient would like 48 hour monitor for evaluation.  Ordered for scheduling. Patient was grateful for call.

## 2015-12-05 ENCOUNTER — Telehealth: Payer: Self-pay | Admitting: Gastroenterology

## 2015-12-05 NOTE — Telephone Encounter (Signed)
The pt has been notified that he can have the procedure as scheduled.  He will follow instructions closely until after the procedure

## 2015-12-06 ENCOUNTER — Encounter (HOSPITAL_COMMUNITY): Payer: Self-pay

## 2015-12-06 ENCOUNTER — Encounter (HOSPITAL_COMMUNITY)
Admission: RE | Disposition: A | Discharge status: Home / Self Care | Payer: Self-pay | Source: Ambulatory Visit | Attending: Gastroenterology

## 2015-12-06 ENCOUNTER — Ambulatory Visit (HOSPITAL_COMMUNITY): Payer: Medicare Other | Admitting: Anesthesiology

## 2015-12-06 ENCOUNTER — Ambulatory Visit (HOSPITAL_COMMUNITY)
Admission: RE | Admit: 2015-12-06 | Discharge: 2015-12-06 | Disposition: A | Discharge status: Home / Self Care | Payer: Medicare Other | Source: Ambulatory Visit | Attending: Gastroenterology | Admitting: Gastroenterology

## 2015-12-06 DIAGNOSIS — Z6835 Body mass index (BMI) 35.0-35.9, adult: Secondary | ICD-10-CM | POA: Diagnosis not present

## 2015-12-06 DIAGNOSIS — D125 Benign neoplasm of sigmoid colon: Secondary | ICD-10-CM | POA: Diagnosis not present

## 2015-12-06 DIAGNOSIS — D124 Benign neoplasm of descending colon: Secondary | ICD-10-CM | POA: Insufficient documentation

## 2015-12-06 DIAGNOSIS — K5520 Angiodysplasia of colon without hemorrhage: Secondary | ICD-10-CM

## 2015-12-06 DIAGNOSIS — D649 Anemia, unspecified: Secondary | ICD-10-CM | POA: Diagnosis not present

## 2015-12-06 DIAGNOSIS — Z86010 Personal history of colonic polyps: Secondary | ICD-10-CM | POA: Insufficient documentation

## 2015-12-06 DIAGNOSIS — E039 Hypothyroidism, unspecified: Secondary | ICD-10-CM | POA: Insufficient documentation

## 2015-12-06 DIAGNOSIS — I252 Old myocardial infarction: Secondary | ICD-10-CM | POA: Insufficient documentation

## 2015-12-06 DIAGNOSIS — K31819 Angiodysplasia of stomach and duodenum without bleeding: Secondary | ICD-10-CM

## 2015-12-06 DIAGNOSIS — Z955 Presence of coronary angioplasty implant and graft: Secondary | ICD-10-CM | POA: Diagnosis not present

## 2015-12-06 DIAGNOSIS — Z79899 Other long term (current) drug therapy: Secondary | ICD-10-CM | POA: Diagnosis not present

## 2015-12-06 DIAGNOSIS — G4730 Sleep apnea, unspecified: Secondary | ICD-10-CM | POA: Insufficient documentation

## 2015-12-06 DIAGNOSIS — I10 Essential (primary) hypertension: Secondary | ICD-10-CM | POA: Diagnosis not present

## 2015-12-06 DIAGNOSIS — K6389 Other specified diseases of intestine: Secondary | ICD-10-CM | POA: Diagnosis not present

## 2015-12-06 DIAGNOSIS — R109 Unspecified abdominal pain: Secondary | ICD-10-CM

## 2015-12-06 DIAGNOSIS — E669 Obesity, unspecified: Secondary | ICD-10-CM | POA: Insufficient documentation

## 2015-12-06 DIAGNOSIS — D509 Iron deficiency anemia, unspecified: Secondary | ICD-10-CM | POA: Diagnosis not present

## 2015-12-06 DIAGNOSIS — K921 Melena: Secondary | ICD-10-CM

## 2015-12-06 DIAGNOSIS — D123 Benign neoplasm of transverse colon: Secondary | ICD-10-CM

## 2015-12-06 DIAGNOSIS — Z87891 Personal history of nicotine dependence: Secondary | ICD-10-CM | POA: Insufficient documentation

## 2015-12-06 DIAGNOSIS — I2510 Atherosclerotic heart disease of native coronary artery without angina pectoris: Secondary | ICD-10-CM | POA: Insufficient documentation

## 2015-12-06 DIAGNOSIS — K5730 Diverticulosis of large intestine without perforation or abscess without bleeding: Secondary | ICD-10-CM | POA: Diagnosis not present

## 2015-12-06 HISTORY — PX: ENTEROSCOPY: SHX5533

## 2015-12-06 HISTORY — DX: Depression, unspecified: F32.A

## 2015-12-06 HISTORY — PX: HOT HEMOSTASIS: SHX5433

## 2015-12-06 HISTORY — DX: Major depressive disorder, single episode, unspecified: F32.9

## 2015-12-06 HISTORY — DX: Personal history of other medical treatment: Z92.89

## 2015-12-06 HISTORY — PX: COLONOSCOPY WITH PROPOFOL: SHX5780

## 2015-12-06 SURGERY — ENTEROSCOPY
Anesthesia: Monitor Anesthesia Care

## 2015-12-06 MED ORDER — PROPOFOL 10 MG/ML IV BOLUS
INTRAVENOUS | Status: AC
  Filled 2015-12-06: qty 40

## 2015-12-06 MED ORDER — SODIUM CHLORIDE 0.9 % IV SOLN
INTRAVENOUS | Status: DC
Start: 2015-12-06 — End: 2015-12-06

## 2015-12-06 MED ORDER — LIDOCAINE HCL (CARDIAC) 20 MG/ML IV SOLN
INTRAVENOUS | Status: AC
  Filled 2015-12-06: qty 5

## 2015-12-06 MED ORDER — FENTANYL CITRATE (PF) 100 MCG/2ML IJ SOLN
INTRAMUSCULAR | Status: AC
  Filled 2015-12-06: qty 2

## 2015-12-06 MED ORDER — PROPOFOL 500 MG/50ML IV EMUL
INTRAVENOUS | Status: DC | PRN
  Administered 2015-12-06: 140 ug/kg/min via INTRAVENOUS

## 2015-12-06 MED ORDER — PROPOFOL 10 MG/ML IV BOLUS
INTRAVENOUS | Status: AC
  Filled 2015-12-06: qty 20

## 2015-12-06 MED ORDER — PROPOFOL 500 MG/50ML IV EMUL
INTRAVENOUS | Status: DC | PRN
  Administered 2015-12-06: 20 mg via INTRAVENOUS
  Administered 2015-12-06: 60 mg via INTRAVENOUS

## 2015-12-06 MED ORDER — FENTANYL CITRATE (PF) 100 MCG/2ML IJ SOLN
25.0000 ug | INTRAMUSCULAR | Status: DC | PRN
  Administered 2015-12-06 (×4): 25 ug via INTRAVENOUS

## 2015-12-06 MED ORDER — LACTATED RINGERS IV SOLN
INTRAVENOUS | Status: DC
  Administered 2015-12-06: 11:00:00 via INTRAVENOUS

## 2015-12-06 NOTE — Discharge Instructions (Signed)
Esophagogastroduodenoscopy, Care After Refer to this sheet in the next few weeks. These instructions provide you with information about caring for yourself after your procedure. Your health care provider may also give you more specific instructions. Your treatment has been planned according to current medical practices, but problems sometimes occur. Call your health care provider if you have any problems or questions after your procedure. WHAT TO EXPECT AFTER THE PROCEDURE After your procedure, it is typical to feel:  Soreness in your throat.  Pain with swallowing.  Sick to your stomach (nauseous).  Bloated.  Dizzy.  Fatigued. HOME CARE INSTRUCTIONS  Do not eat or drink anything until the numbing medicine (local anesthetic) has worn off and your gag reflex has returned. You will know that the local anesthetic has worn off when you can swallow comfortably.  Do not drive or operate machinery until directed by your health care provider.  Take medicines only as directed by your health care provider. SEEK MEDICAL CARE IF:   You cannot stop coughing.  You are not urinating at all or less than usual. SEEK IMMEDIATE MEDICAL CARE IF:  You have difficulty swallowing.  You cannot eat or drink.  You have worsening throat or chest pain.  You have dizziness or lightheadedness or you faint.  You have nausea or vomiting.  You have chills.  You have a fever.  You have severe abdominal pain.  You have black, tarry, or bloody stools.   This information is not intended to replace advice given to you by your health care provider. Make sure you discuss any questions you have with your health care provider.   Document Released: 06/25/2012 Document Revised: 07/30/2014 Document Reviewed: 06/25/2012 Elsevier Interactive Patient Education 2016 [employer].  Colonoscopy, Care After These instructions give you information on caring for yourself after your procedure. Your doctor may also  give you more specific instructions. Call your doctor if you have any problems or questions after your procedure. HOME CARE  Do not drive for 24 hours.  Do not sign important papers or use machinery for 24 hours.  You may shower.  You may go back to your usual activities, but go slower for the first 24 hours.  Take rest breaks often during the first 24 hours.  Walk around or use warm packs on your belly (abdomen) if you have belly cramping or gas.  Drink enough fluids to keep your pee (urine) clear or pale yellow.  Resume your normal diet. Avoid heavy or fried foods.  Avoid drinking alcohol for 24 hours or as told by your doctor.  Only take medicines as told by your doctor. If a tissue sample (biopsy) was taken during the procedure:   Do not take aspirin or blood thinners for 7 days, or as told by your doctor.  Do not drink alcohol for 7 days, or as told by your doctor.  Eat soft foods for the first 24 hours. GET HELP IF: You still have a small amount of blood in your poop (stool) 2-3 days after the procedure. GET HELP RIGHT AWAY IF:  You have more than a small amount of blood in your poop.  You see clumps of tissue (blood clots) in your poop.  Your belly is puffy (swollen).  You feel sick to your stomach (nauseous) or throw up (vomit).  You have a fever.  You have belly pain that gets worse and medicine does not help. MAKE SURE YOU:  Understand these instructions.  Will watch your condition.  Will get help right away if you are not doing well or get worse.   This information is not intended to replace advice given to you by your health care provider. Make sure you discuss any questions you have with your health care provider.   Document Released: 08/11/2010 Document Revised: 07/14/2013 Document Reviewed: 03/16/2013 Elsevier Interactive Patient Education 2016 [employer].   Resume plavix 5 days from now We will send you a letter with the report of your  polyps. Please take iron sulfate 325 mg tablets three times a day for your anemia and see your primary care doctor in 2-3 weeks to have your blood counts checked.

## 2015-12-06 NOTE — H&P (Signed)
  [facility] Progress Note  Chief Complaint: anemia  Subjective History:  75 yo man seen in our office 10/12/15 by PA Zehr for acute on chronic anemia. He has not seen black tarry stool or BRBPR, denies abd pain.  He is fatigued and has DOE ever since his coronary stent earlier this year.  ROS: Cardiovascular:  no chest pain Respiratory: no dyspnea  The patient's Past Medical, Family and Social History were reviewed and are on file in the EMR.  Objective:  Med list reviewed  Vital signs in last 24 hrs: Filed Vitals:   12/06/15 1126  BP: 138/77  Pulse: 71  Temp: 97.6 F (36.4 C)  Resp: 20    Physical Exam   HEENT: sclera anicteric, oral mucosa moist without lesions.  No conjunctival pallor  Neck: supple, no thyromegaly, JVD or lymphadenopathy  Cardiac: RRR without murmurs, S1S2 heard, no peripheral edema  Pulm: clear to auscultation bilaterally, normal RR and effort noted  Abdomen: obese, soft, no tenderness, with active bowel sounds. No guarding or palpable hepatosplenomegaly.  Skin; warm and dry, no jaundice or rash  Recent Labs:  CBC    Component Value Date/Time   WBC 11.1* 10/12/2015 0954   RBC 4.50 10/12/2015 0954   RBC 4.52 09/04/2007 1425   HGB 9.5* 10/12/2015 0954   HCT 31.1* 10/12/2015 0954   PLT 373.0 10/12/2015 0954   MCV 69.2* 10/12/2015 0954   MCH 29.0 11/27/2014 0605   MCHC 30.5 10/12/2015 0954   RDW 18.1* 10/12/2015 0954   LYMPHSABS 2.7 10/12/2015 0954   MONOABS 1.3* 10/12/2015 0954   EOSABS 0.6 10/12/2015 0954   BASOSABS 0.1 10/12/2015 0954       @ASSESSMENTPLANBEGIN @ Assessment: Iron deficiency anemia  History of SB AVMs and colonic polyps.    Plan: SB enteroscopy and colonoscopy.   Nelida Meuse III

## 2015-12-06 NOTE — Transfer of Care (Signed)
Immediate Anesthesia Transfer of Care Note  Patient: [name]  Procedure(s) Performed: Procedure(s): ENTEROSCOPY (N/A) HOT HEMOSTASIS (ARGON PLASMA COAGULATION/BICAP) (N/A) COLONOSCOPY WITH PROPOFOL (N/A)  Patient Location: PACU  Anesthesia Type:MAC  Level of Consciousness: awake, alert  and oriented  Airway & Oxygen Therapy: Patient Spontanous Breathing and Patient connected to nasal cannula oxygen  Post-op Assessment: Report given to RN and Post -op Vital signs reviewed and stable  Post vital signs: Reviewed and stable  Last Vitals:  Filed Vitals:   12/06/15 1126  BP: 138/77  Pulse: 71  Temp: 36.4 C  Resp: 20    Last Pain: There were no vitals filed for this visit.       Complications: No apparent anesthesia complications

## 2015-12-06 NOTE — Anesthesia Preprocedure Evaluation (Addendum)
Anesthesia Evaluation  Patient identified by MRN, date of birth, ID band Patient awake    Reviewed: Allergy & Precautions, NPO status , Patient's Chart, lab work & pertinent test results  Airway Mallampati: II  TM Distance: >3 FB Neck ROM: Full    Dental no notable dental hx.    Pulmonary shortness of breath, sleep apnea , former smoker,    Pulmonary exam normal breath sounds clear to auscultation       Cardiovascular hypertension, Pt. on medications + CAD and + Past MI  Normal cardiovascular exam Rhythm:Regular Rate:Normal     Neuro/Psych  Headaches, PSYCHIATRIC DISORDERS Depression    GI/Hepatic Neg liver ROS, GERD  ,  Endo/Other  Hypothyroidism   Renal/GU negative Renal ROS  negative genitourinary   Musculoskeletal negative musculoskeletal ROS (+)   Abdominal (+) + obese,   Peds negative pediatric ROS (+)  Hematology  (+) anemia ,   Anesthesia Other Findings   Reproductive/Obstetrics negative OB ROS                           Anesthesia Physical Anesthesia Plan  ASA: III  Anesthesia Plan: MAC   Post-op Pain Management:    Induction: Intravenous  Airway Management Planned: Natural Airway  Additional Equipment:   Intra-op Plan:   Post-operative Plan:   Informed Consent: I have reviewed the patients History and Physical, chart, labs and discussed the procedure including the risks, benefits and alternatives for the proposed anesthesia with the patient or authorized representative who has indicated his/her understanding and acceptance.   Dental advisory given  Plan Discussed with: CRNA  Anesthesia Plan Comments:         Anesthesia Quick Evaluation

## 2015-12-06 NOTE — Interval H&P Note (Signed)
History and Physical Interval Note:  12/06/2015 11:52 AM  [name]  has presented today for surgery, with the diagnosis of anemia/dark stool/abd.pain/hx of colon polyps  The various methods of treatment have been discussed with the patient and family. After consideration of risks, benefits and other options for treatment, the patient has consented to  Procedure(s): ENTEROSCOPY (N/A) HOT HEMOSTASIS (ARGON PLASMA COAGULATION/BICAP) (N/A) COLONOSCOPY WITH PROPOFOL (N/A) as a surgical intervention .  The patient's history has been reviewed, patient examined, no change in status, stable for surgery.  I have reviewed the patient's chart and labs.  Questions were answered to the patient's satisfaction.     Nelida Meuse III

## 2015-12-06 NOTE — Anesthesia Postprocedure Evaluation (Signed)
Anesthesia Post Note  Patient: [name]  Procedure(s) Performed: Procedure(s) (LRB): ENTEROSCOPY (N/A) HOT HEMOSTASIS (ARGON PLASMA COAGULATION/BICAP) (N/A) COLONOSCOPY WITH PROPOFOL (N/A)  Patient location during evaluation: PACU Anesthesia Type: MAC Level of consciousness: awake and alert Pain management: pain level controlled Vital Signs Assessment: post-procedure vital signs reviewed and stable Respiratory status: spontaneous breathing, nonlabored ventilation, respiratory function stable and patient connected to nasal cannula oxygen Cardiovascular status: stable and blood pressure returned to baseline Anesthetic complications: no    Last Vitals:  Filed Vitals:   12/06/15 1410 12/06/15 1420  BP: 126/46 117/95  Pulse: 77 80  Temp:    Resp: 19 18    Last Pain:  Filed Vitals:   12/06/15 1423  PainSc: 2                  Pebbles Zeiders J

## 2015-12-06 NOTE — Op Note (Signed)
[facility] Patient Name: [name] Procedure Date: 12/06/2015 MRN: [id number] Attending MD: Estill Cotta. Loletha [name] , MD Date of Birth: [birth year] CSN: [id number] Age: 75 Admit Type: Outpatient Procedure:                Small bowel enteroscopy Indications:              Iron deficiency anemia, History of small bowel AVMs Providers:                Mallie Mussel L. Loletha Carrow, MD, Tory Emerald, RN, Despina Pole,                            Technician, Rosario Adie, CRNA Referring MD:              Medicines:                Monitored Anesthesia Care Complications:            No immediate complications. Estimated Blood Loss:     Estimated blood loss: none. Procedure:                Pre-Anesthesia Assessment:                           - Prior to the procedure, a History and Physical                            was performed, and patient medications and                            allergies were reviewed. The patient's tolerance of                            previous anesthesia was also reviewed. The risks                            and benefits of the procedure and the sedation                            options and risks were discussed with the patient.                            All questions were answered, and informed consent                            was obtained. Prior Anticoagulants: The patient has                            taken Plavix (clopidogrel), last dose was 5 days                            prior to procedure. ASA Grade Assessment: III - A                            patient with severe systemic disease. After  reviewing the risks and benefits, the patient was                            deemed in satisfactory condition to undergo the                            procedure.                           After obtaining informed consent, the endoscope was                            passed under direct vision. Throughout the                             procedure, the patient's blood pressure, pulse, and                            oxygen saturations were monitored continuously. The                            was introduced through the mouth and advanced to                            the proximal jejunum. The small bowel enteroscopy                            was accomplished without difficulty. The patient                            tolerated the procedure well. Scope In: Scope Out: Findings:      The esophagus was normal.      The stomach was normal.      A single angiodysplastic lesion with no bleeding was found in the second       portion of the duodenum. It had a small central umbilication. APC at 72       Watts was used to ablate the AVM - excellent result.      A single angiodysplastic lesion with no bleeding was found in the       proximal jejunum. APC at 58 Watts used to ablate the AVM - excellent       result. Impression:               - Normal esophagus.                           - Normal stomach.                           - A single non-bleeding angiodysplastic lesion in                            the duodenum.                           - A single non-bleeding angiodysplastic lesion in  the jejunum.                           - No specimens collected. Recommendation:           - See colonoscopy report                           Resume plavix in 5 days Procedure Code(s):        --- Professional ---                           [phone], Small intestinal endoscopy, enteroscopy                            beyond second portion of duodenum, not including                            ileum; diagnostic, including collection of                            specimen(s) by brushing or washing, when performed                            (separate procedure) Diagnosis Code(s):        --- Professional ---                           [id number], Angiodysplasia of stomach and duodenum                            without  bleeding                           K55.20, Angiodysplasia of colon without hemorrhage                           D50.9, Iron deficiency anemia, unspecified CPT copyright 2016 American [employer]. All rights reserved. The codes documented in this report are preliminary and upon coder review may  be revised to meet current compliance requirements. Christopher L. Loletha Carrow, MD 12/06/2015 12:59:46 PM This report has been signed electronically. Number of Addenda: 0

## 2015-12-06 NOTE — Op Note (Signed)
[facility] Patient Name: [name] Procedure Date: 12/06/2015 MRN: [id number] Attending MD: Estill Cotta. Loletha Carrow , MD Date of Birth: 1941-01-26 CSN: [id number] Age: 75 Admit Type: Outpatient Procedure:                Colonoscopy Indications:              Iron deficiency anemia, Personal history of colonic                            polyps Providers:                Mallie Mussel L. Loletha Carrow, MD, Tory Emerald, RN, Despina Pole,                            Technician, Rosario Adie, CRNA Referring MD:              Medicines:                Monitored Anesthesia Care Complications:            No immediate complications. Estimated Blood Loss:     Estimated blood loss was minimal. Procedure:                Pre-Anesthesia Assessment:                           - Prior to the procedure, a History and Physical                            was performed, and patient medications and                            allergies were reviewed. The patient's tolerance of                            previous anesthesia was also reviewed. The risks                            and benefits of the procedure and the sedation                            options and risks were discussed with the patient.                            All questions were answered, and informed consent                            was obtained. Prior Anticoagulants: The patient has                            taken Plavix (clopidogrel), last dose was 5 days                            prior to procedure. ASA Grade Assessment: III - A  patient with severe systemic disease. After                            reviewing the risks and benefits, the patient was                            deemed in satisfactory condition to undergo the                            procedure.                           After obtaining informed consent, the colonoscope                            was passed under direct vision. Throughout the                             procedure, the patient's blood pressure, pulse, and                            oxygen saturations were monitored continuously. The                            EC-[id number]) scope was introduced through                            the anus and advanced to the the cecum, identified                            by appendiceal orifice and ileocecal valve. The                            [id number]) scope was introduced through                            the and advanced to the. The colonoscopy was                            performed without difficulty. The patient tolerated                            the procedure well. The quality of the bowel                            preparation was good. The ileocecal valve,                            appendiceal orifice, and rectum were photographed.                            The bowel preparation used was Miralax. Scope In: 12:15:54 PM Scope Out: [id number] PM Scope Withdrawal Time: 0 hours 26 minutes 37 seconds  Total Procedure Duration: 0 hours 32 minutes  48 seconds  Findings:      The perianal and digital rectal examinations were normal.      Diverticula were found in the sigmoid colon.      Five sessile polyps were found in the descending colon and transverse       colon. The polyps were 4 to 6 mm in size. These polyps were removed with       a cold snare. Resection and retrieval were complete.      Three pedunculated polyps were found in the sigmoid colon and transverse       colon. The polyps were 4 to 10 mm in size. These polyps were removed       with a hot snare. Resection was complete, but the largest polyp tissue       was not retrieved.      The exam was otherwise without abnormality on direct and retroflexion       views. Impression:               - Diverticulosis in the sigmoid colon.                           - Five 4 to 6 mm polyps in the descending colon and                            in the  transverse colon, removed with a cold snare.                            Resected and retrieved.                           - Three 4 to 10 mm polyps in the sigmoid colon and                            in the transverse colon, removed with a hot snare.                            Complete resection. Partial retrieval.                           - The examination was otherwise normal on direct                            and retroflexion views. Moderate Sedation:      MAC sedation used Recommendation:           - Patient has a contact number available for                            emergencies. The signs and symptoms of potential                            delayed complications were discussed with the                            patient. Return to normal activities tomorrow.  Written discharge instructions were provided to the                            patient.                           - Resume previous diet.                           - Resume Plavix (clopidogrel) at prior dose in 5                            days.                           - Await pathology results.                           - Repeat colonoscopy is recommended for                            surveillance. The colonoscopy date will be                            determined after pathology results from today's                            exam become available for review.                           - Continue iron sulfate 325 mg three times daily.                           Follow up with primary care in 2-3 weeks to check                            blood counts and determine duration of iron                            supplements. Procedure Code(s):        --- Professional ---                           [phone], Colonoscopy, flexible; with removal of                            tumor(s), polyp(s), or other lesion(s) by snare                            technique Diagnosis Code(s):        --- Professional  ---                           D12.4, Benign neoplasm of descending colon                           D12.5, Benign  neoplasm of sigmoid colon                           D12.3, Benign neoplasm of transverse colon (hepatic                            flexure or splenic flexure)                           D50.9, Iron deficiency anemia, unspecified                           Z86.010, Personal history of colonic polyps                           K57.30, Diverticulosis of large intestine without                            perforation or abscess without bleeding CPT copyright 2016 American Medical Association. All rights reserved. The codes documented in this report are preliminary and upon coder review may  be revised to meet current compliance requirements. Henry L. Loletha Carrow, MD 12/06/2015 1:06:10 PM This report has been signed electronically. Number of Addenda: 0

## 2015-12-07 ENCOUNTER — Encounter: Payer: Self-pay | Admitting: Gastroenterology

## 2015-12-07 ENCOUNTER — Encounter (HOSPITAL_COMMUNITY): Payer: Self-pay | Admitting: Gastroenterology

## 2015-12-08 ENCOUNTER — Ambulatory Visit (INDEPENDENT_AMBULATORY_CARE_PROVIDER_SITE_OTHER): Payer: Medicare Other

## 2015-12-08 ENCOUNTER — Other Ambulatory Visit: Payer: Self-pay

## 2015-12-08 ENCOUNTER — Telehealth: Payer: Self-pay

## 2015-12-08 DIAGNOSIS — I499 Cardiac arrhythmia, unspecified: Secondary | ICD-10-CM

## 2015-12-08 DIAGNOSIS — R42 Dizziness and giddiness: Secondary | ICD-10-CM

## 2015-12-08 NOTE — Telephone Encounter (Addendum)
Pt in the office today for 48-hour Holter placement and while walking out started to feel dizzy and lightheaded. According to montior tech pt grabbed wall and stopped almost going down to the floor.  Pt evaluated by triage nurse. EKG (NS with PAC's/PVC's), BP 128/74 HR74 Oxygen 93 on RA. Pt stated he has this happen from time to time with exertion or from sitting to standing. Pt stated he has no warning just things start to go dark and he usually catches himself and then after resting he feels better. He has no symptoms of cp/pressure or edema. Pt does have SOB with exertion. This first occurred 2 weeks ago, he was standing from couch and then went black and fell back. Pt recently had GI procedure and back procedure but this started before those were done.  Orthostatic Vitals: Laying 122/70 Hr 73; sitting 90/68 HR70; and Standing 98/72 HR 74 - pt experience extreme dizziness laying to sitting and a sitting to standing.  Pt concerns reviewed with DOD, Lexa,  Stated pt needs to make sure he is hydrated, set f/u with PCP and cardiology, and EKG stable. Pt also not to drive if having syncopal episodes, commonly for 6 months. Pt made aware of MD recommendations and called family to come pick him up. Pt expressed that he does not think he drinks a lot most days, and he rarely takes his Lasix. 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  Pt scheduled for see PA 5/26 @8am  in our office and 5/22 @ 9:45am at Dr. Barbie Banner office.  Pt contacted and made aware of appointments, no questions at this time.

## 2015-12-08 NOTE — Telephone Encounter (Signed)
Thanks

## 2015-12-12 ENCOUNTER — Encounter: Payer: Self-pay | Admitting: Family Medicine

## 2015-12-12 ENCOUNTER — Telehealth: Payer: Self-pay | Admitting: Family Medicine

## 2015-12-12 ENCOUNTER — Ambulatory Visit (INDEPENDENT_AMBULATORY_CARE_PROVIDER_SITE_OTHER): Payer: Medicare Other | Admitting: Family Medicine

## 2015-12-12 VITALS — BP 138/77 | HR 73 | Temp 98.3°F | Ht 69.25 in | Wt 238.0 lb

## 2015-12-12 DIAGNOSIS — I10 Essential (primary) hypertension: Secondary | ICD-10-CM

## 2015-12-12 DIAGNOSIS — I25110 Atherosclerotic heart disease of native coronary artery with unstable angina pectoris: Secondary | ICD-10-CM | POA: Diagnosis not present

## 2015-12-12 DIAGNOSIS — R0600 Dyspnea, unspecified: Secondary | ICD-10-CM

## 2015-12-12 DIAGNOSIS — M545 Low back pain, unspecified: Secondary | ICD-10-CM | POA: Insufficient documentation

## 2015-12-12 LAB — BASIC METABOLIC PANEL
BUN: 17 mg/dL (ref 6–23)
CHLORIDE: 103 meq/L (ref 96–112)
CO2: 25 meq/L (ref 19–32)
Calcium: 9.2 mg/dL (ref 8.4–10.5)
Creatinine, Ser: 1.01 mg/dL (ref 0.40–1.50)
GFR: 76.72 mL/min (ref >60.00)
GLUCOSE: 96 mg/dL (ref 70–99)
POTASSIUM: 4.1 meq/L (ref 3.5–5.1)
SODIUM: 136 meq/L (ref 135–145)

## 2015-12-12 LAB — CBC WITH DIFFERENTIAL/PLATELET
BASOS PCT: 0.8 % (ref 0.0–3.0)
Basophils Absolute: 0.1 10*3/uL (ref 0.0–0.1)
EOS PCT: 4.9 % (ref 0.0–5.0)
Eosinophils Absolute: 0.5 10*3/uL (ref 0.0–0.7)
HCT: 29 % — ABNORMAL LOW (ref 39.0–52.0)
Hemoglobin: 8.5 g/dL — ABNORMAL LOW (ref 13.0–17.0)
LYMPHS ABS: 2.4 10*3/uL (ref 0.7–4.0)
Lymphocytes Relative: 23.6 % (ref 12.0–46.0)
MONO ABS: 1.1 10*3/uL — AB (ref 0.1–1.0)
MONOS PCT: 10.9 % (ref 3.0–12.0)
NEUTROS PCT: 59.8 % (ref 43.0–77.0)
Neutro Abs: 6 10*3/uL (ref 1.4–7.7)
Platelets: 422 10*3/uL — ABNORMAL HIGH (ref 150.0–400.0)
RBC: 4.41 Mil/uL (ref 4.22–5.81)
RDW: 19 % — AB (ref 11.5–15.5)
WBC: 10.1 10*3/uL (ref 4.0–10.5)

## 2015-12-12 MED ORDER — MORPHINE SULFATE 30 MG PO TABS: 30.0000 mg | ORAL_TABLET | Freq: Four times a day (QID) | ORAL | Status: DC | PRN

## 2015-12-12 NOTE — Progress Notes (Signed)
Pre visit review using our clinic review tool, if applicable. No additional management support is needed unless otherwise documented below in the visit note. 

## 2015-12-12 NOTE — Progress Notes (Signed)
   Subjective:    Patient ID: [name], male    DOB: Apr 15, 1941, 75 y.o.   MRN: [id number]  HPI Here for worsening leg pains and for spells of lightheadedness. The lightheadedness started several weeks ago and he feels it when he first stands up from a sitting or lying position. He feels fine when sitting still or when standing still. No sensation of the room spinning. No trouble when he moves his head quickly. He has had some spells of chest pain and numbness in the left arm in the past few weeks, and these were relieved by a SL NTG tablet.    Review of Systems  Constitutional: Negative.   Respiratory: Negative.   Cardiovascular: Positive for chest pain. Negative for palpitations and leg swelling.  Musculoskeletal: Positive for back pain and arthralgias.  Neurological: Negative for dizziness and headaches.       Objective:   Physical Exam  Constitutional: He is oriented to person, place, and time. He appears well-developed and well-nourished.  Walks with a cane   Neck: No thyromegaly present.  Cardiovascular: Normal rate, regular rhythm, normal heart sounds and intact distal pulses.   Pulmonary/Chest: Effort normal and breath sounds normal.  Lymphadenopathy:    He has no cervical adenopathy.  Neurological: He is alert and oriented to person, place, and time. No cranial nerve deficit. He exhibits normal muscle tone. Coordination normal.          Assessment & Plan:  He seems to be having some angina symptoms and he will use NTG as needed. He is scheduled to see Cardiology in a week or so. He seems to be a bit orthostatic now. He will drink plenty of fluids and we will get labs today to make sure his anemia has not worsened. For the back pain, increase the morphine to 30 mg every 6 hours prn.  [name], MD

## 2015-12-12 NOTE — Telephone Encounter (Signed)
I left a message for pt with below information. 

## 2015-12-12 NOTE — Telephone Encounter (Signed)
The patient stated that Dr. Sarajane Jews was mentioning about giving the patient some medicine to help him sleep. He's only getting about four hours of sleep. You can send it to: [facility] #[id number] - [place], [place] - [address] [phone] (Phone) [phone] (Fax)

## 2015-12-12 NOTE — Telephone Encounter (Signed)
Per Dr. Sarajane Jews, he increased one of pt's medications and hoping this will help, instead of prescribing something right now for sleep.

## 2015-12-13 ENCOUNTER — Encounter: Payer: Self-pay | Admitting: Physician Assistant

## 2015-12-14 ENCOUNTER — Encounter (HOSPITAL_COMMUNITY): Payer: Self-pay | Admitting: *Deleted

## 2015-12-14 ENCOUNTER — Inpatient Hospital Stay (HOSPITAL_COMMUNITY)
Admission: EM | Admit: 2015-12-14 | Discharge: 2015-12-15 | DRG: 811 | Disposition: A | Discharge status: Home / Self Care | Payer: Medicare Other | Attending: Internal Medicine | Admitting: Internal Medicine

## 2015-12-14 DIAGNOSIS — Z6833 Body mass index (BMI) 33.0-33.9, adult: Secondary | ICD-10-CM | POA: Diagnosis not present

## 2015-12-14 DIAGNOSIS — Z7902 Long term (current) use of antithrombotics/antiplatelets: Secondary | ICD-10-CM

## 2015-12-14 DIAGNOSIS — D500 Iron deficiency anemia secondary to blood loss (chronic): Secondary | ICD-10-CM

## 2015-12-14 DIAGNOSIS — Z87891 Personal history of nicotine dependence: Secondary | ICD-10-CM

## 2015-12-14 DIAGNOSIS — F329 Major depressive disorder, single episode, unspecified: Secondary | ICD-10-CM | POA: Diagnosis present

## 2015-12-14 DIAGNOSIS — Z79899 Other long term (current) drug therapy: Secondary | ICD-10-CM

## 2015-12-14 DIAGNOSIS — Z8249 Family history of ischemic heart disease and other diseases of the circulatory system: Secondary | ICD-10-CM | POA: Diagnosis not present

## 2015-12-14 DIAGNOSIS — Z881 Allergy status to other antibiotic agents status: Secondary | ICD-10-CM | POA: Diagnosis not present

## 2015-12-14 DIAGNOSIS — D62 Acute posthemorrhagic anemia: Principal | ICD-10-CM | POA: Diagnosis present

## 2015-12-14 DIAGNOSIS — G894 Chronic pain syndrome: Secondary | ICD-10-CM | POA: Diagnosis not present

## 2015-12-14 DIAGNOSIS — K558 Other vascular disorders of intestine: Secondary | ICD-10-CM | POA: Diagnosis not present

## 2015-12-14 DIAGNOSIS — T45525A Adverse effect of antithrombotic drugs, initial encounter: Secondary | ICD-10-CM | POA: Diagnosis present

## 2015-12-14 DIAGNOSIS — Z7982 Long term (current) use of aspirin: Secondary | ICD-10-CM

## 2015-12-14 DIAGNOSIS — I252 Old myocardial infarction: Secondary | ICD-10-CM | POA: Diagnosis not present

## 2015-12-14 DIAGNOSIS — G2581 Restless legs syndrome: Secondary | ICD-10-CM | POA: Diagnosis not present

## 2015-12-14 DIAGNOSIS — Z955 Presence of coronary angioplasty implant and graft: Secondary | ICD-10-CM

## 2015-12-14 DIAGNOSIS — E669 Obesity, unspecified: Secondary | ICD-10-CM | POA: Diagnosis present

## 2015-12-14 DIAGNOSIS — D649 Anemia, unspecified: Secondary | ICD-10-CM | POA: Diagnosis not present

## 2015-12-14 DIAGNOSIS — Z91013 Allergy to seafood: Secondary | ICD-10-CM

## 2015-12-14 DIAGNOSIS — E785 Hyperlipidemia, unspecified: Secondary | ICD-10-CM | POA: Diagnosis present

## 2015-12-14 DIAGNOSIS — G4733 Obstructive sleep apnea (adult) (pediatric): Secondary | ICD-10-CM | POA: Diagnosis present

## 2015-12-14 DIAGNOSIS — I25119 Atherosclerotic heart disease of native coronary artery with unspecified angina pectoris: Secondary | ICD-10-CM | POA: Diagnosis present

## 2015-12-14 DIAGNOSIS — E039 Hypothyroidism, unspecified: Secondary | ICD-10-CM | POA: Diagnosis not present

## 2015-12-14 DIAGNOSIS — D509 Iron deficiency anemia, unspecified: Secondary | ICD-10-CM | POA: Diagnosis present

## 2015-12-14 DIAGNOSIS — Z888 Allergy status to other drugs, medicaments and biological substances status: Secondary | ICD-10-CM | POA: Diagnosis not present

## 2015-12-14 DIAGNOSIS — K5521 Angiodysplasia of colon with hemorrhage: Secondary | ICD-10-CM | POA: Diagnosis not present

## 2015-12-14 DIAGNOSIS — R531 Weakness: Secondary | ICD-10-CM | POA: Diagnosis not present

## 2015-12-14 DIAGNOSIS — I10 Essential (primary) hypertension: Secondary | ICD-10-CM | POA: Diagnosis present

## 2015-12-14 DIAGNOSIS — I25110 Atherosclerotic heart disease of native coronary artery with unstable angina pectoris: Secondary | ICD-10-CM

## 2015-12-14 DIAGNOSIS — I2510 Atherosclerotic heart disease of native coronary artery without angina pectoris: Secondary | ICD-10-CM | POA: Diagnosis present

## 2015-12-14 DIAGNOSIS — I5032 Chronic diastolic (congestive) heart failure: Secondary | ICD-10-CM | POA: Diagnosis present

## 2015-12-14 DIAGNOSIS — K219 Gastro-esophageal reflux disease without esophagitis: Secondary | ICD-10-CM | POA: Diagnosis not present

## 2015-12-14 DIAGNOSIS — R2681 Unsteadiness on feet: Secondary | ICD-10-CM | POA: Diagnosis present

## 2015-12-14 LAB — CBC
HCT: 29.2 % — ABNORMAL LOW (ref 39.0–52.0)
Hemoglobin: 7.9 g/dL — ABNORMAL LOW (ref 13.0–17.0)
MCH: 19.1 pg — AB (ref 26.0–34.0)
MCHC: 27.1 g/dL — ABNORMAL LOW (ref 30.0–36.0)
MCV: 70.7 fL — ABNORMAL LOW (ref 78.0–100.0)
PLATELETS: 404 10*3/uL — AB (ref 150–400)
RBC: 4.13 MIL/uL — AB (ref 4.22–5.81)
RDW: 17.9 % — ABNORMAL HIGH (ref 11.5–15.5)
WBC: 8.9 10*3/uL (ref 4.0–10.5)

## 2015-12-14 LAB — FOLATE: Folate: 26.8 ng/mL (ref >5.9)

## 2015-12-14 LAB — BASIC METABOLIC PANEL
ANION GAP: 5 (ref 5–15)
BUN: 17 mg/dL (ref 6–20)
CO2: 27 mmol/L (ref 22–32)
Calcium: 8.7 mg/dL — ABNORMAL LOW (ref 8.9–10.3)
Chloride: 106 mmol/L (ref 101–111)
Creatinine, Ser: 0.99 mg/dL (ref 0.61–1.24)
GFR calc Af Amer: >60 mL/min (ref >60)
Glucose, Bld: 130 mg/dL — ABNORMAL HIGH (ref 65–99)
POTASSIUM: 3.9 mmol/L (ref 3.5–5.1)
SODIUM: 138 mmol/L (ref 135–145)

## 2015-12-14 LAB — IRON AND TIBC
Iron: 10 ug/dL — ABNORMAL LOW (ref 45–182)
SATURATION RATIOS: 2 % — AB (ref 17.9–39.5)
TIBC: 521 ug/dL — AB (ref 250–450)
UIBC: 511 ug/dL

## 2015-12-14 LAB — RETICULOCYTES
RBC.: 3.99 MIL/uL — AB (ref 4.22–5.81)
RETIC CT PCT: 1.8 % (ref 0.4–3.1)
Retic Count, Absolute: 71.8 10*3/uL (ref 19.0–186.0)

## 2015-12-14 LAB — POC OCCULT BLOOD, ED: Fecal Occult Bld: NEGATIVE

## 2015-12-14 LAB — FERRITIN: FERRITIN: 3 ng/mL — AB (ref 24–336)

## 2015-12-14 LAB — VITAMIN B12: VITAMIN B 12: 106 pg/mL — AB (ref 180–914)

## 2015-12-14 LAB — PREPARE RBC (CROSSMATCH)

## 2015-12-14 MED ORDER — FLUTICASONE PROPIONATE 50 MCG/ACT NA SUSP
1.0000 | Freq: Every day | NASAL | Status: DC
  Administered 2015-12-14 – 2015-12-15 (×2): 1 via NASAL
  Filled 2015-12-14: qty 16

## 2015-12-14 MED ORDER — FUROSEMIDE 10 MG/ML IJ SOLN
40.0000 mg | Freq: Once | INTRAMUSCULAR | Status: AC
  Administered 2015-12-14: 40 mg via INTRAVENOUS
  Filled 2015-12-14: qty 4

## 2015-12-14 MED ORDER — CLONAZEPAM 0.5 MG PO TABS
0.5000 mg | ORAL_TABLET | Freq: Three times a day (TID) | ORAL | Status: DC | PRN
  Administered 2015-12-14: 0.5 mg via ORAL
  Filled 2015-12-14: qty 1

## 2015-12-14 MED ORDER — ASPIRIN EC 81 MG PO TBEC
81.0000 mg | DELAYED_RELEASE_TABLET | Freq: Every day | ORAL | Status: DC
  Administered 2015-12-14 – 2015-12-15 (×2): 81 mg via ORAL
  Filled 2015-12-14 (×2): qty 1

## 2015-12-14 MED ORDER — FOLIC ACID 1 MG PO TABS
1.0000 mg | ORAL_TABLET | Freq: Every day | ORAL | Status: DC
  Administered 2015-12-14 – 2015-12-15 (×2): 1 mg via ORAL
  Filled 2015-12-14 (×2): qty 1

## 2015-12-14 MED ORDER — EZETIMIBE 10 MG PO TABS
10.0000 mg | ORAL_TABLET | Freq: Every day | ORAL | Status: DC
  Administered 2015-12-14 – 2015-12-15 (×2): 10 mg via ORAL
  Filled 2015-12-14 (×2): qty 1

## 2015-12-14 MED ORDER — TETRAHYDROZOLINE HCL 0.05 % OP SOLN
1.0000 [drp] | Freq: Two times a day (BID) | OPHTHALMIC | Status: DC
  Administered 2015-12-14 – 2015-12-15 (×2): 1 [drp] via OPHTHALMIC
  Filled 2015-12-14: qty 15

## 2015-12-14 MED ORDER — MORPHINE SULFATE 15 MG PO TABS: 30.0000 mg | ORAL_TABLET | Freq: Four times a day (QID) | ORAL | Status: DC | PRN

## 2015-12-14 MED ORDER — VITAMIN C 500 MG PO TABS
500.0000 mg | ORAL_TABLET | Freq: Every day | ORAL | Status: DC
  Administered 2015-12-14 – 2015-12-15 (×2): 500 mg via ORAL
  Filled 2015-12-14 (×2): qty 1

## 2015-12-14 MED ORDER — HYDROMORPHONE HCL 1 MG/ML IJ SOLN: 1.0000 mg | INTRAMUSCULAR | Status: DC | PRN

## 2015-12-14 MED ORDER — CLOPIDOGREL BISULFATE 75 MG PO TABS
75.0000 mg | ORAL_TABLET | Freq: Every day | ORAL | Status: DC
  Administered 2015-12-14 – 2015-12-15 (×2): 75 mg via ORAL
  Filled 2015-12-14 (×2): qty 1

## 2015-12-14 MED ORDER — SODIUM CHLORIDE 0.9% FLUSH
3.0000 mL | Freq: Two times a day (BID) | INTRAVENOUS | Status: DC
  Administered 2015-12-14 – 2015-12-15 (×2): 3 mL via INTRAVENOUS

## 2015-12-14 MED ORDER — OXYCODONE-ACETAMINOPHEN 10-325 MG PO TABS: 1.0000 | ORAL_TABLET | Freq: Every day | ORAL | Status: DC | PRN

## 2015-12-14 MED ORDER — OXYCODONE-ACETAMINOPHEN 5-325 MG PO TABS
1.0000 | ORAL_TABLET | ORAL | Status: DC | PRN
  Administered 2015-12-15 (×3): 1 via ORAL
  Filled 2015-12-14 (×3): qty 1

## 2015-12-14 MED ORDER — FUROSEMIDE 40 MG PO TABS: 40.0000 mg | ORAL_TABLET | Freq: Every day | ORAL | Status: DC | PRN

## 2015-12-14 MED ORDER — PANTOPRAZOLE SODIUM 40 MG PO TBEC
40.0000 mg | DELAYED_RELEASE_TABLET | Freq: Every day | ORAL | Status: DC
  Administered 2015-12-14 – 2015-12-15 (×2): 40 mg via ORAL
  Filled 2015-12-14 (×2): qty 1

## 2015-12-14 MED ORDER — ROPINIROLE HCL 0.5 MG PO TABS
0.5000 mg | ORAL_TABLET | Freq: Three times a day (TID) | ORAL | Status: DC
  Administered 2015-12-14 – 2015-12-15 (×2): 0.5 mg via ORAL
  Filled 2015-12-14 (×5): qty 1

## 2015-12-14 MED ORDER — OXYCODONE HCL 5 MG PO TABS
5.0000 mg | ORAL_TABLET | ORAL | Status: DC | PRN
  Administered 2015-12-15 (×3): 5 mg via ORAL
  Filled 2015-12-14 (×3): qty 1

## 2015-12-14 MED ORDER — SODIUM CHLORIDE 0.9 % IV SOLN
Freq: Once | INTRAVENOUS | Status: AC
  Administered 2015-12-14: 18:00:00 via INTRAVENOUS

## 2015-12-14 MED ORDER — DICLOFENAC SODIUM 1 % TD GEL
1.0000 "application " | Freq: Two times a day (BID) | TRANSDERMAL | Status: DC
  Administered 2015-12-14 – 2015-12-15 (×2): 1 via TOPICAL
  Filled 2015-12-14: qty 100

## 2015-12-14 MED ORDER — OXYCODONE-ACETAMINOPHEN 5-325 MG PO TABS
1.0000 | ORAL_TABLET | Freq: Every day | ORAL | Status: DC | PRN
  Administered 2015-12-14: 1 via ORAL
  Filled 2015-12-14: qty 1

## 2015-12-14 MED ORDER — NITROGLYCERIN 0.4 MG SL SUBL: 0.4000 mg | SUBLINGUAL_TABLET | SUBLINGUAL | Status: DC | PRN

## 2015-12-14 MED ORDER — OXYCODONE HCL 5 MG PO TABS
5.0000 mg | ORAL_TABLET | Freq: Every day | ORAL | Status: DC | PRN
  Administered 2015-12-14: 5 mg via ORAL
  Filled 2015-12-14: qty 1

## 2015-12-14 MED ORDER — LEVOTHYROXINE SODIUM 50 MCG PO TABS
75.0000 ug | ORAL_TABLET | Freq: Every day | ORAL | Status: DC
  Administered 2015-12-15: 75 ug via ORAL
  Filled 2015-12-14: qty 1

## 2015-12-14 MED ORDER — METHOCARBAMOL 750 MG PO TABS
375.0000 mg | ORAL_TABLET | Freq: Four times a day (QID) | ORAL | Status: DC | PRN
  Filled 2015-12-14: qty 0.5

## 2015-12-14 NOTE — H&P (Signed)
Patient Demographics:    [name], is a 75 y.o. male  MRN: [id number]   DOB - 07-12-1941  Admit Date - 12/14/2015  Outpatient Primary MD for the patient is [name], MD   Assessment & Plan:    Principal Problem:   Anemia Active Problems:   Coronary artery disease   AVM (arteriovenous malformation) of small bowel, acquired with hemorrhage ([place])   Essential hypertension    1)Acute on chronic iron deficiency anemia- patient with well-established history of  chronic iron deficiency anemia with small bilateral AVMs, please see Reports of endoluminal evaluation on 12/06/2015. Patient's usual baseline hemoglobin is between 9 and 10, 2 days ago hemoglobin was 8.5, today hemoglobin is 7.9, patient reports dizziness, fatigue shortness of breath and weakness,. Stool Hemoccult is negative today, patient denies melena or hematochezia recently. Admit to telemetry monitored unit due  symptomatic anemia with dizziness, shortness of breath and fatigue. Transfuse 2 units of PRBCs, please note the patient has CAD with previous angioplasty and stenting in May 2016 (on asa and plavix), target hemoglobin should be around 10  given his history of CAD  2)H/o CAD- now with Dizziness/SOB/DOE, suspect that his current symptoms are secondary to anemia as above in #1 rather than true angina equivalent . Pt is s/p prior angioplasty and stenting of LAD and RCA in May 2016, patient is currently on aspirin and Plavix, get echocardiogram to evaluate EF, last known EF 55-60% from May 2016  3) chronic pain syndrome-continue home opiate regimen   With History of - Reviewed by me  Past Medical History  Diagnosis Date  . Anemia   . Hyperlipidemia   . Hypertension   . Allergy   . Myocardial infarction ([place])   . GERD  (gastroesophageal reflux disease)   . Restless leg syndrome   . Hypothyroidism   . Headache(784.0)   . CAD (coronary artery disease)     a. Nonobst disease by cath 2009. b. s/p PCI 10/2014 with DES to [place], patent by relook 11/2014 (Brilinta changed to Plavix with improved sx).  . Gastritis 2011  . Bronchitis   . Back pain     lower back pain."inflammed disc" pain meds for this  . Diverticulosis   . Adenomatous polyp of colon 2007  . AVM (arteriovenous malformation) 2011    a. S/p argon plasma coagulation and ablation in 2011, 2017.  . Statin intolerance   . Bradycardia     a. H/o almost 7sec pause nocturnally during 2011 admission. Also has h/o fatigue with BB.  . Depression   . Transfusion history     several years ago -GI bleed  . Obstructive sleep apnea     -no cpap use  . Chronic diastolic CHF (congestive heart failure) ([place])   . PVC's (premature ventricular contractions) Holter 2016  . Premature atrial contractions Holter 2016      Past Surgical History  Procedure  Laterality Date  . Appendectomy    . Hernia repair    . Tonsillectomy    . Ankle surgery Left   . Colonoscopy  08-23-05    per Dr. Deatra Ina, adenomatous polyps, repeat in 5 yrs   . Esophagogastroduodenoscopy  08-23-05    per Dr. Deatra Ina, cauterized jejunal AVMs   . Skin graft Right     leg  . Left heart catheterization with coronary angiogram N/A 09/08/2012    Procedure: LEFT HEART CATHETERIZATION WITH CORONARY ANGIOGRAM;  Surgeon: Burnell Blanks, MD;  Location: [facility] CATH LAB;  Service: Cardiovascular;  Laterality: N/A;  . Left heart catheterization with coronary angiogram N/A 11/16/2014    Procedure: LEFT HEART CATHETERIZATION WITH CORONARY ANGIOGRAM;  Surgeon: Leonie Man, MD;  Location: [facility] CATH LAB;  Service: Cardiovascular;  Laterality: N/A;  . Cardiac catheterization N/A 11/26/2014    Procedure: Left Heart Cath and Coronary Angiography;  Surgeon: Burnell Blanks, MD;  Location: [place]  CV LAB;  Service: Cardiovascular;  Laterality: N/A;  . Enteroscopy N/A 12/06/2015    Procedure: ENTEROSCOPY;  Surgeon: Doran Stabler, MD;  Location: [facility] ENDOSCOPY;  Service: Gastroenterology;  Laterality: N/A;  . Hot hemostasis N/A 12/06/2015    Procedure: HOT HEMOSTASIS (ARGON PLASMA COAGULATION/BICAP);  Surgeon: Doran Stabler, MD;  Location: [facility] ENDOSCOPY;  Service: Gastroenterology;  Laterality: N/A;  . Colonoscopy with propofol N/A 12/06/2015    Procedure: COLONOSCOPY WITH PROPOFOL;  Surgeon: Doran Stabler, MD;  Location: [facility] ENDOSCOPY;  Service: Gastroenterology;  Laterality: N/A;      Chief Complaint  Patient presents with  . Abnormal Lab      HPI:    [name]  is a 75 y.o. male, Which past medical history relevant for CAD, chronic anemia with AVMs, hypertension and obesity presents with Dizziness/SOB/DOE, as well as fatigue, but no frank chest pains. atient's usual baseline hemoglobin is between 9 and 10, 2 days ago hemoglobin was 8.5, today hemoglobin is 7.9, patient reports dizziness, fatigue shortness of breath and weakness,. Stool Hemoccult is negative today, patient denies melena or hematochezia recently. Patient's wife is at bedside, questions answered, no syncope. nausea, vomiting, diarrhea . No fever  Or chills . Please see colonoscopy and EGD evaluation report from 12/06/2015   Review of systems:    In addition to the HPI above,   A full 12 point Review of Systems was done, except as stated above, all other Review of Systems were negative.    Social History:  Reviewed by me    Social History  Substance Use Topics  . Smoking status: Former Smoker -- 2.00 packs/day for 50 years    Types: Cigarettes    Quit date: 07/23/2002  . Smokeless tobacco: Never Used  . Alcohol Use: No       Family History :  Reviewed by me    Family History  Problem Relation Age of Onset  . Heart disease Father   . Stroke Father   . Leukemia Mother   . Alcoholism  Paternal Uncle   . Alcoholism Maternal Grandfather   . Heart attack Father      Home Medications:   Prior to Admission medications   Medication Sig Start Date End Date Taking? Authorizing Provider  Ascorbic Acid (VITAMIN C PO) Take 1 tablet by mouth daily as needed (flu like symptopms).   Yes Historical Provider, MD  aspirin EC 81 MG tablet Take 81 mg by mouth daily.   Yes Historical  Provider, MD  clonazePAM (KLONOPIN) 0.5 MG tablet Take 1 tablet (0.5 mg total) by mouth 3 (three) times daily as needed. Patient taking differently: Take 0.5 mg by mouth 3 (three) times daily as needed for anxiety.  10/07/15  Yes [name], MD  clopidogrel (PLAVIX) 75 MG tablet Take 1 tablet (75 mg total) by mouth daily. 10/07/15  Yes [name], MD  diclofenac sodium (VOLTAREN) 1 % GEL Apply 1 application topically daily as needed (ankle pain). 11/27/14  Yes Rhonda G Barrett, PA-C  ezetimibe (ZETIA) 10 MG tablet Take 1 tablet (10 mg total) by mouth daily. 04/06/15  Yes Burnell Blanks, MD  fluticasone (FLONASE) 50 MCG/ACT nasal spray Place 1 spray into both nostrils daily. Patient taking differently: Place 1 spray into both nostrils daily as needed for allergies.  11/27/14  Yes Rhonda G Barrett, PA-C  furosemide (LASIX) 40 MG tablet Take 40 mg by mouth daily as needed for fluid or edema.   Yes Historical Provider, MD  levothyroxine (SYNTHROID, LEVOTHROID) 75 MCG tablet Take 1 tablet (75 mcg total) by mouth daily. 10/04/15  Yes [name], MD  methocarbamol (ROBAXIN-750) 750 MG tablet Take 0.5 tablets (375 mg total) by mouth every 6 (six) hours as needed for muscle spasms. Patient taking differently: Take 375-750 mg by mouth every 6 (six) hours as needed for muscle spasms.  07/27/15  Yes [name], MD  morphine (MSIR) 30 MG tablet Take 1 tablet (30 mg total) by mouth every 6 (six) hours as needed for severe pain. 12/12/15  Yes [name], MD  nitroGLYCERIN (NITROSTAT) 0.4 MG SL tablet PLACE 1 TABLET  UNDER TONGUE EVERY 5 MINUTES AS NEEDED FOR CHEST PAIN 04/01/15  Yes Burnell Blanks, MD  omeprazole (PRILOSEC) 20 MG capsule Take 1 capsule (20 mg total) by mouth 2 (two) times daily. 11/30/15  Yes [name], MD  oxyCODONE-acetaminophen (PERCOCET) 10-325 MG tablet Take 1 tablet by mouth daily as needed (moderate pain). 09/30/15  Yes [name], MD  ropinirole (REQUIP) 5 MG tablet TAKE 1 TABLET (5 MG TOTAL) BY MOUTH AT BEDTIME. 10/07/15  Yes [name], MD  Tetrahydrozoline HCl (MURINE FOR RED EYES OP) Place 1 drop into both eyes daily as needed (red eyes).   Yes Historical Provider, MD     Allergies:     Allergies  Allergen Reactions  . Brilinta [Ticagrelor] Shortness Of Breath  . Metoprolol Tartrate Other (See Comments)    Sever chest pains " flat lined patient"  . Shellfish Allergy Anaphylaxis and Hives  . Statins Other (See Comments)    All statins cause myalgias   . Zolpidem Other (See Comments)    Chest pain  . Levaquin [Levofloxacin] Hives  . Trazodone And Nefazodone Other (See Comments)    Unsteady on feet     Physical Exam:   Vitals  Blood pressure 141/68, pulse 76, temperature 98.4 F (36.9 C), temperature source Oral, resp. rate 18, height 5\' 10"  (1.778 m), weight 105.6 kg (232 lb 12.9 oz), SpO2 97 %.  Physical Examination: General appearance - alert, obese, and in no distress  Mental status - alert, oriented to person, place, and time,  Eyes - sclera anicteric Neck - supple, no JVD elevation , Chest -Diminished in bases bilaterally,  Heart - S1 and S2 normal, irregular but not irregularly irregular Abdomen - soft, nontender, nondistended, no masses or organomegaly, increased truncal adiposity Neurological - screening mental status exam normal, neck supple  without rigidity, cranial nerves II through XII intact, DTR's normal and symmetric, generalized weakness without new focal deficits Extremities - + 1 pedal edema noted, intact peripheral pulses  Skin  - warm, dry    Data Review:    CBC  Recent Labs Lab 12/12/15 1105 12/14/15 1052  WBC 10.1 8.9  HGB 8.5 Repeated and verified X2.* 7.9*  HCT 29.0* 29.2*  PLT 422.0* 404*  MCV 65.7 Repeated and verified X2.* 70.7*  MCH  --  19.1*  MCHC 29.5 Repeated and verified X2.* 27.1*  RDW 19.0* 17.9*  LYMPHSABS 2.4  --   MONOABS 1.1*  --   EOSABS 0.5  --   BASOSABS 0.1  --    ------------------------------------------------------------------------------------------------------------------  Chemistries   Recent Labs Lab 12/12/15 1105 12/14/15 1052  NA 136 138  K 4.1 3.9  CL 103 106  CO2 25 27  GLUCOSE 96 130*  BUN 17 17  CREATININE 1.01 0.99  CALCIUM 9.2 8.7*   ------------------------------------------------------------------------------------------------------------------ estimated creatinine clearance is 79.6 mL/min (by C-G formula based on Cr of 0.99). ------------------------------------------------------------------------------------------------------------------ No results for input(s): TSH, T4TOTAL, T3FREE, THYROIDAB in the last 72 hours.  Invalid input(s): FREET3   Coagulation profile No results for input(s): INR, PROTIME in the last 168 hours. ------------------------------------------------------------------------------------------------------------------- No results for input(s): DDIMER in the last 72 hours. -------------------------------------------------------------------------------------------------------------------  Cardiac Enzymes No results for input(s): CKMB, TROPONINI, MYOGLOBIN in the last 168 hours.  Invalid input(s): CK ------------------------------------------------------------------------------------------------------------------    Component Value Date/Time   BNP 21.3 11/26/2014 1119     ---------------------------------------------------------------------------------------------------------------  Urinalysis    Component Value  Date/Time   COLORURINE YELLOW 09/04/2007 1537   APPEARANCEUR CLEAR 09/04/2007 1537   LABSPEC 1.012 09/04/2007 1537   PHURINE 6.0 09/04/2007 1537   GLUCOSEU NEGATIVE 09/04/2007 1537   HGBUR NEGATIVE 09/04/2007 1537   BILIRUBINUR n 09/30/2015 1504   BILIRUBINUR NEGATIVE 09/04/2007 1537   KETONESUR NEGATIVE 09/04/2007 1537   PROTEINUR n 09/30/2015 1504   PROTEINUR NEGATIVE 09/04/2007 1537   UROBILINOGEN 0.2 09/30/2015 1504   UROBILINOGEN 1.0 09/04/2007 1537   NITRITE n 09/30/2015 1504   NITRITE NEGATIVE 09/04/2007 1537   LEUKOCYTESUR Negative 09/30/2015 1504    ----------------------------------------------------------------------------------------------------------------   Imaging Results:    No results found.  Radiological Exams on Admission: No results found.  DVT Prophylaxis SCD  AM Labs Ordered, also please review Full Orders  Family Communication: Admission, patients condition and plan of care including tests being ordered have been discussed with the patient and wife at bedside who indicate understanding and agree with the plan   Code Status - Full Code  Likely DC to  Home   Condition   fair  Permelia Bamba M.D on 12/14/2015 at 5:37 PM   Between 7am to 7pm - Pager - [phone]  After 7pm go to [email or web address] - password [email or web address]  [facility] - Office  [phone]  Dragon dictation system was used to create this note, attempts have been made to correct errors, however presence of uncorrected errors is not a reflection quality of care provided.

## 2015-12-14 NOTE — ED Notes (Signed)
Per pt report: pt had a colonoscopy and endoscopy last Tuesday in which they found small leakages that they cauterized.  More blood was done this past Monday at Dr. Wandra Scot office and pt was informed that pt's hemoglobin was 8.5.  Pt reports feeling dizzy but has been feeling this way for the past couple of months.  Pt able to ambulate with a cane without any difficulties.  Pt reports feeling "exhausted all the time."

## 2015-12-14 NOTE — ED Provider Notes (Signed)
CSN: [id number]     Arrival date & time 12/14/15  [id number] History   First MD Initiated Contact with Patient 12/14/15 1000     Chief Complaint  Patient presents with  . Abnormal Lab      HPI Patient presents to emergency prompt lightheadedness and generalized weakness of the past several weeks.  He was sent to the emergency department by his physician after noted to be anemic with a hemoglobin of 8.5 two days ago.  He has a history of recurrent small bowel AVMs and slow GI bleed.  He is on Plavix given his history of coronary stents.  Last week he underwent what sounds like argon laser treatment of a small area bleeding on endoscopy.  He's continued to feel worse since then.  He denies melena or hematochezia.  Denies nausea vomiting.  Denies abdominal pain.  Reports no chest pain shortness breath.  Denies productive cough.  He feels generally fatigued   Past Medical History  Diagnosis Date  . Anemia   . Hyperlipidemia   . Hypertension   . Allergy   . Myocardial infarction ([place])   . GERD (gastroesophageal reflux disease)   . Restless leg syndrome   . Hypothyroidism   . Headache(784.0)   . CAD (coronary artery disease)     a. Nonobst disease by cath 2009. b. s/p PCI 10/2014 with DES to [place], patent by relook 11/2014 (Brilinta changed to Plavix with improved sx).  . Gastritis 2011  . Bronchitis   . Back pain     lower back pain."inflammed disc" pain meds for this  . Diverticulosis   . Adenomatous polyp of colon 2007  . AVM (arteriovenous malformation) 2011    a. S/p argon plasma coagulation and ablation in 2011, 2017.  . Statin intolerance   . Bradycardia     a. H/o almost 7sec pause nocturnally during 2011 admission. Also has h/o fatigue with BB.  . Depression   . Transfusion history     several years ago -GI bleed  . Obstructive sleep apnea     -no cpap use  . Chronic diastolic CHF (congestive heart failure) ([place])   . PVC's (premature ventricular contractions) Holter 2016   . Premature atrial contractions Holter 2016   Past Surgical History  Procedure Laterality Date  . Appendectomy    . Hernia repair    . Tonsillectomy    . Ankle surgery Left   . Colonoscopy  08-23-05    per Dr. Deatra Ina, adenomatous polyps, repeat in 5 yrs   . Esophagogastroduodenoscopy  08-23-05    per Dr. Deatra Ina, cauterized jejunal AVMs   . Skin graft Right     leg  . Left heart catheterization with coronary angiogram N/A 09/08/2012    Procedure: LEFT HEART CATHETERIZATION WITH CORONARY ANGIOGRAM;  Surgeon: Burnell Blanks, MD;  Location: [facility] CATH LAB;  Service: Cardiovascular;  Laterality: N/A;  . Left heart catheterization with coronary angiogram N/A 11/16/2014    Procedure: LEFT HEART CATHETERIZATION WITH CORONARY ANGIOGRAM;  Surgeon: Leonie Man, MD;  Location: [facility] CATH LAB;  Service: Cardiovascular;  Laterality: N/A;  . Cardiac catheterization N/A 11/26/2014    Procedure: Left Heart Cath and Coronary Angiography;  Surgeon: Burnell Blanks, MD;  Location: [place] CV LAB;  Service: Cardiovascular;  Laterality: N/A;  . Enteroscopy N/A 12/06/2015    Procedure: ENTEROSCOPY;  Surgeon: Doran Stabler, MD;  Location: [facility] ENDOSCOPY;  Service: Gastroenterology;  Laterality: N/A;  . Hot hemostasis  N/A 12/06/2015    Procedure: HOT HEMOSTASIS (ARGON PLASMA COAGULATION/BICAP);  Surgeon: Doran Stabler, MD;  Location: [facility] ENDOSCOPY;  Service: Gastroenterology;  Laterality: N/A;  . Colonoscopy with propofol N/A 12/06/2015    Procedure: COLONOSCOPY WITH PROPOFOL;  Surgeon: Doran Stabler, MD;  Location: [facility] ENDOSCOPY;  Service: Gastroenterology;  Laterality: N/A;   Family History  Problem Relation Age of Onset  . Heart disease Father   . Stroke Father   . Leukemia Mother   . Alcoholism Paternal Uncle   . Alcoholism Maternal Grandfather   . Heart attack Father    Social History  Substance Use Topics  . Smoking status: Former Smoker -- 2.00 packs/day for 50 years    Types:  Cigarettes    Quit date: 07/23/2002  . Smokeless tobacco: Never Used  . Alcohol Use: No    Review of Systems  All other systems reviewed and are negative.     Allergies  Brilinta; Metoprolol tartrate; Shellfish allergy; Statins; Zolpidem; Levaquin; and Trazodone and nefazodone  Home Medications   Prior to Admission medications   Medication Sig Start Date End Date Taking? Authorizing Provider  Ascorbic Acid (VITAMIN C PO) Take 1 tablet by mouth daily as needed (flu like symptopms).   Yes Historical Provider, MD  aspirin EC 81 MG tablet Take 81 mg by mouth daily.   Yes Historical Provider, MD  clonazePAM (KLONOPIN) 0.5 MG tablet Take 1 tablet (0.5 mg total) by mouth 3 (three) times daily as needed. Patient taking differently: Take 0.5 mg by mouth 3 (three) times daily as needed for anxiety.  10/07/15  Yes [name], MD  clopidogrel (PLAVIX) 75 MG tablet Take 1 tablet (75 mg total) by mouth daily. 10/07/15  Yes [name], MD  diclofenac sodium (VOLTAREN) 1 % GEL Apply 1 application topically daily as needed (ankle pain). 11/27/14  Yes Rhonda G Barrett, PA-C  ezetimibe (ZETIA) 10 MG tablet Take 1 tablet (10 mg total) by mouth daily. 04/06/15  Yes Burnell Blanks, MD  fluticasone (FLONASE) 50 MCG/ACT nasal spray Place 1 spray into both nostrils daily. Patient taking differently: Place 1 spray into both nostrils daily as needed for allergies.  11/27/14  Yes Rhonda G Barrett, PA-C  furosemide (LASIX) 40 MG tablet Take 40 mg by mouth daily as needed for fluid or edema.   Yes Historical Provider, MD  levothyroxine (SYNTHROID, LEVOTHROID) 75 MCG tablet Take 1 tablet (75 mcg total) by mouth daily. 10/04/15  Yes [name], MD  methocarbamol (ROBAXIN-750) 750 MG tablet Take 0.5 tablets (375 mg total) by mouth every 6 (six) hours as needed for muscle spasms. Patient taking differently: Take 375-750 mg by mouth every 6 (six) hours as needed for muscle spasms.  07/27/15  Yes [name], MD   morphine (MSIR) 30 MG tablet Take 1 tablet (30 mg total) by mouth every 6 (six) hours as needed for severe pain. 12/12/15  Yes [name], MD  nitroGLYCERIN (NITROSTAT) 0.4 MG SL tablet PLACE 1 TABLET UNDER TONGUE EVERY 5 MINUTES AS NEEDED FOR CHEST PAIN 04/01/15  Yes Burnell Blanks, MD  omeprazole (PRILOSEC) 20 MG capsule Take 1 capsule (20 mg total) by mouth 2 (two) times daily. 11/30/15  Yes [name], MD  oxyCODONE-acetaminophen (PERCOCET) 10-325 MG tablet Take 1 tablet by mouth daily as needed (moderate pain). 09/30/15  Yes [name], MD  ropinirole (REQUIP) 5 MG tablet TAKE 1 TABLET (5 MG TOTAL) BY  MOUTH AT BEDTIME. 10/07/15  Yes [name], MD  Tetrahydrozoline HCl (MURINE FOR RED EYES OP) Place 1 drop into both eyes daily as needed (red eyes).   Yes Historical Provider, MD   BP 131/77 mmHg  Pulse 79  Temp(Src) 98.1 F (36.7 C) (Oral)  Resp 18  Ht 5\' 10"  (1.778 m)  Wt 236 lb (107.049 kg)  BMI 33.86 kg/m2  SpO2 99% Physical Exam  Constitutional: He is oriented to person, place, and time. He appears well-developed and well-nourished.  HENT:  Head: Normocephalic and atraumatic.  Eyes: EOM are normal.  Neck: Normal range of motion.  Cardiovascular: Normal rate, regular rhythm, normal heart sounds and intact distal pulses.   Pulmonary/Chest: Effort normal and breath sounds normal. No respiratory distress.  Abdominal: Soft. He exhibits no distension. There is no tenderness.  Musculoskeletal: Normal range of motion.  Neurological: He is alert and oriented to person, place, and time.  Skin: Skin is warm and dry.  Psychiatric: He has a normal mood and affect. Judgment normal.  Nursing note and vitals reviewed.   ED Course  Procedures (including critical care time) Labs Review Labs Reviewed  CBC - Abnormal; Notable for the following:    RBC 4.13 (*)    Hemoglobin 7.9 (*)    HCT 29.2 (*)    MCV 70.7 (*)    MCH 19.1 (*)    MCHC 27.1 (*)    RDW 17.9 (*)     Platelets 404 (*)    All other components within normal limits  BASIC METABOLIC PANEL - Abnormal; Notable for the following:    Glucose, Bld 130 (*)    Calcium 8.7 (*)    All other components within normal limits  TYPE AND SCREEN   HEMOGLOBIN  Date Value Ref Range Status  12/14/2015 7.9* 13.0 - 17.0 g/dL Final  12/12/2015 8.5 Repeated and verified X2.* 13.0 - 17.0 g/dL Final  10/12/2015 9.5* 13.0 - 17.0 g/dL Final  09/30/2015 9.3* 13.0 - 17.0 g/dL Final       Imaging Review No results found. I have personally reviewed and evaluated these images and lab results as part of my medical decision-making.   EKG Interpretation None      MDM   Final diagnoses:  None    Hemoccult negative.  Hemoglobin continues to fall.  Today's hemoglobin is 7.9.  I think the patient benefit from blood transfusion.  I'll leave this decision up to the admitting team.  Admit to [facility].  Anemia panel sent.      Jola Schmidt, MD 12/14/15 1410

## 2015-12-15 DIAGNOSIS — Z881 Allergy status to other antibiotic agents status: Secondary | ICD-10-CM | POA: Diagnosis not present

## 2015-12-15 DIAGNOSIS — I10 Essential (primary) hypertension: Secondary | ICD-10-CM

## 2015-12-15 DIAGNOSIS — D649 Anemia, unspecified: Secondary | ICD-10-CM | POA: Diagnosis not present

## 2015-12-15 DIAGNOSIS — Z888 Allergy status to other drugs, medicaments and biological substances status: Secondary | ICD-10-CM | POA: Diagnosis not present

## 2015-12-15 DIAGNOSIS — I5032 Chronic diastolic (congestive) heart failure: Secondary | ICD-10-CM | POA: Diagnosis not present

## 2015-12-15 DIAGNOSIS — D62 Acute posthemorrhagic anemia: Secondary | ICD-10-CM | POA: Diagnosis not present

## 2015-12-15 DIAGNOSIS — Z79899 Other long term (current) drug therapy: Secondary | ICD-10-CM | POA: Diagnosis not present

## 2015-12-15 DIAGNOSIS — G894 Chronic pain syndrome: Secondary | ICD-10-CM | POA: Diagnosis not present

## 2015-12-15 DIAGNOSIS — Z91013 Allergy to seafood: Secondary | ICD-10-CM | POA: Diagnosis not present

## 2015-12-15 DIAGNOSIS — K219 Gastro-esophageal reflux disease without esophagitis: Secondary | ICD-10-CM | POA: Diagnosis not present

## 2015-12-15 DIAGNOSIS — G2581 Restless legs syndrome: Secondary | ICD-10-CM | POA: Diagnosis not present

## 2015-12-15 DIAGNOSIS — K558 Other vascular disorders of intestine: Secondary | ICD-10-CM | POA: Diagnosis not present

## 2015-12-15 DIAGNOSIS — Z8249 Family history of ischemic heart disease and other diseases of the circulatory system: Secondary | ICD-10-CM | POA: Diagnosis not present

## 2015-12-15 DIAGNOSIS — I2510 Atherosclerotic heart disease of native coronary artery without angina pectoris: Secondary | ICD-10-CM | POA: Diagnosis not present

## 2015-12-15 DIAGNOSIS — D509 Iron deficiency anemia, unspecified: Secondary | ICD-10-CM | POA: Diagnosis not present

## 2015-12-15 DIAGNOSIS — Z7982 Long term (current) use of aspirin: Secondary | ICD-10-CM | POA: Diagnosis not present

## 2015-12-15 DIAGNOSIS — G4733 Obstructive sleep apnea (adult) (pediatric): Secondary | ICD-10-CM | POA: Diagnosis not present

## 2015-12-15 DIAGNOSIS — Z87891 Personal history of nicotine dependence: Secondary | ICD-10-CM | POA: Diagnosis not present

## 2015-12-15 DIAGNOSIS — K5521 Angiodysplasia of colon with hemorrhage: Secondary | ICD-10-CM | POA: Diagnosis not present

## 2015-12-15 DIAGNOSIS — E039 Hypothyroidism, unspecified: Secondary | ICD-10-CM | POA: Diagnosis not present

## 2015-12-15 DIAGNOSIS — Z7902 Long term (current) use of antithrombotics/antiplatelets: Secondary | ICD-10-CM | POA: Diagnosis not present

## 2015-12-15 DIAGNOSIS — I252 Old myocardial infarction: Secondary | ICD-10-CM | POA: Diagnosis not present

## 2015-12-15 DIAGNOSIS — Z955 Presence of coronary angioplasty implant and graft: Secondary | ICD-10-CM | POA: Diagnosis not present

## 2015-12-15 DIAGNOSIS — E785 Hyperlipidemia, unspecified: Secondary | ICD-10-CM | POA: Diagnosis not present

## 2015-12-15 LAB — TYPE AND SCREEN
ABO/RH(D): O POS
Antibody Screen: NEGATIVE
UNIT DIVISION: 0
UNIT DIVISION: 0

## 2015-12-15 LAB — CBC
HCT: 33.9 % — ABNORMAL LOW (ref 39.0–52.0)
HEMOGLOBIN: 9.7 g/dL — AB (ref 13.0–17.0)
MCH: 20.9 pg — AB (ref 26.0–34.0)
MCHC: 28.6 g/dL — ABNORMAL LOW (ref 30.0–36.0)
MCV: 73.1 fL — AB (ref 78.0–100.0)
Platelets: 355 10*3/uL (ref 150–400)
RBC: 4.64 MIL/uL (ref 4.22–5.81)
RDW: 19.3 % — ABNORMAL HIGH (ref 11.5–15.5)
WBC: 9.9 10*3/uL (ref 4.0–10.5)

## 2015-12-15 LAB — BASIC METABOLIC PANEL
ANION GAP: 8 (ref 5–15)
BUN: 17 mg/dL (ref 6–20)
CHLORIDE: 102 mmol/L (ref 101–111)
CO2: 30 mmol/L (ref 22–32)
Calcium: 8.7 mg/dL — ABNORMAL LOW (ref 8.9–10.3)
Creatinine, Ser: 1.14 mg/dL (ref 0.61–1.24)
GFR calc Af Amer: >60 mL/min (ref >60)
GLUCOSE: 108 mg/dL — AB (ref 65–99)
POTASSIUM: 3.7 mmol/L (ref 3.5–5.1)
Sodium: 140 mmol/L (ref 135–145)

## 2015-12-15 MED ORDER — SODIUM CHLORIDE 0.9 % IV SOLN
25.0000 mg | Freq: Once | INTRAVENOUS | Status: AC
  Administered 2015-12-15: 25 mg via INTRAVENOUS
  Filled 2015-12-15: qty 0.5

## 2015-12-15 MED ORDER — SODIUM CHLORIDE 0.9 % IV SOLN
500.0000 mg | Freq: Once | INTRAVENOUS | Status: AC
  Administered 2015-12-15: 500 mg via INTRAVENOUS
  Filled 2015-12-15: qty 10

## 2015-12-15 NOTE — Progress Notes (Signed)
Pt selected [facility] for HHPT.  Referral given to in house rep. 

## 2015-12-15 NOTE — Progress Notes (Signed)
Physical Therapy Treatment Patient Details Name: [name] MRN: [id number] DOB: 1940/10/21 Today's Date: 12/15/2015    History of Present Illness 75 yo male admitted with anemia, dizziness, SOB. Hx of chronic iron def anemia, chronic pain, CAD, AVM, essential HTN    PT Comments    Improved mobility and activity tolerance this session. Pt walked ~250 feet with use of cane. Slow but steady gait. No c/o dizziness. Pt agreeable to HHPT follow up for at least one visit.   Follow Up Recommendations  Home health PT     Equipment Recommendations  None recommended by PT    Recommendations for Other Services       Precautions / Restrictions Precautions Precautions: Fall Restrictions Weight Bearing Restrictions: No    Mobility  Bed Mobility               General bed mobility comments: Pt oob walking in room  Transfers Overall transfer level: Needs assistance Equipment used: Rolling walker (2 wheeled) Transfers: Sit to/from Stand Sit to Stand: Supervision         General transfer comment: for safety  Ambulation/Gait Ambulation/Gait assistance: Min guard Ambulation Distance (Feet): 250 Feet Assistive device: Straight cane Gait Pattern/deviations: Step-through pattern;Decreased stride length     General Gait Details: close guard for safety. Slow but steady with cane use. Pt tolerated distance well. No c/o dizziness.    Stairs            Wheelchair Mobility    Modified Rankin (Stroke Patients Only)       Balance Overall balance assessment: Needs assistance         Standing balance support: During functional activity;Bilateral upper extremity supported Standing balance-Leahy Scale: Poor                      Cognition Arousal/Alertness: Awake/alert Behavior During Therapy: WFL for tasks assessed/performed Overall Cognitive Status: Within Functional Limits for tasks assessed                      Exercises      General  Comments        Pertinent Vitals/Pain Pain Assessment: No/denies pain    Home Living Family/patient expects to be discharged to:: Private residence Living Arrangements: Spouse/significant other Available Help at Discharge: Family Type of Home: House Home Access: Stairs to enter   Home Layout: One level Home Equipment: Cane - single point      Prior Function Level of Independence: Independent with assistive device(s)      Comments: cane PRN   PT Goals (current goals can now be found in the care plan section) Acute Rehab PT Goals Patient Stated Goal: to feel better.  PT Goal Formulation: With patient Time For Goal Achievement: 12/29/15 Potential to Achieve Goals: Good Progress towards PT goals: Progressing toward goals    Frequency  Min 3X/week    PT Plan Current plan remains appropriate    Co-evaluation             End of Session Equipment Utilized During Treatment: Gait belt Activity Tolerance: Patient tolerated treatment well Patient left: in bed;with call bell/phone within reach     Time: 1410-1420 PT Time Calculation (min) (ACUTE ONLY): 10 min  Charges:  $Gait Training: 8-22 mins                    G Codes:  Functional Assessment Tool Used: clinical judgement Functional Limitation: Mobility: Walking and  moving around Mobility: Walking and Moving Around Goal Status [phone]): At least 1 percent but less than 20 percent impaired, limited or restricted Mobility: Walking and Moving Around Discharge Status [phone]): At least 1 percent but less than 20 percent impaired, limited or restricted    Weston Anna, MPT Pager: [phone]

## 2015-12-15 NOTE — Evaluation (Signed)
Physical Therapy Evaluation Patient Details Name: JEVIN SAILORS MRN: [id number] DOB: [birth year] Today's Date: 12/15/2015   History of Present Illness  75 yo male admitted with anemia, dizziness, SOB. Hx of chronic iron def anemia, chronic pain, CAD, AVM, essential HTN  Clinical Impression  On eval, pt required Min assist for mobility. Only able to walk ~15 feet in room with RW. Pt is very unsteady, weak. High risk for falls at this time!. Unable to safely attempt ambulation in hallway. Pt c/o dizziness, nausea. He had difficulty remaining fully alert-appears drowsy at times. Do not feel pt is safe to d/c home at this time. Will return later today to reassess mobility. Made RN aware.     Follow Up Recommendations  (to be determined after 2nd session)    Equipment Recommendations   (TBD)    Recommendations for Other Services       Precautions / Restrictions Precautions Precautions: Fall Restrictions Weight Bearing Restrictions: No      Mobility  Bed Mobility               General bed mobility comments: sitting EOB at start of session  Transfers Overall transfer level: Needs assistance Equipment used: Rolling walker (2 wheeled) Transfers: Sit to/from Stand Sit to Stand: Min assist         General transfer comment: Assist to rise, stabilize, control descent. Very unsteady.   Ambulation/Gait Ambulation/Gait assistance: Min assist Ambulation Distance (Feet): 15 Feet Assistive device: Rolling walker (2 wheeled) Gait Pattern/deviations: Step-to pattern;Step-through pattern;Decreased stride length     General Gait Details: Very unsteady. Assist to stabilize pt and walker. Knees buckling intermittently.   Stairs            Wheelchair Mobility    Modified Rankin (Stroke Patients Only)       Balance Overall balance assessment: Needs assistance         Standing balance support: During functional activity;Bilateral upper extremity supported Standing  balance-Leahy Scale: Poor                               Pertinent Vitals/Pain Pain Assessment: No/denies pain    Home Living Family/patient expects to be discharged to:: Private residence Living Arrangements: Spouse/significant other Available Help at Discharge: Family Type of Home: House Home Access: Stairs to enter   Technical brewer of Steps: 4 Home Layout: One level Home Equipment: Cane - single point      Prior Function Level of Independence: Independent with assistive device(s)         Comments: cane PRN     Hand Dominance        Extremity/Trunk Assessment   Upper Extremity Assessment: Overall WFL for tasks assessed           Lower Extremity Assessment: Generalized weakness      Cervical / Trunk Assessment: Normal  Communication   Communication: No difficulties  Cognition Arousal/Alertness:  (appears drowsy at times; difficulty remaining fully alert) Behavior During Therapy: WFL for tasks assessed/performed Overall Cognitive Status: Within Functional Limits for tasks assessed                      General Comments      Exercises        Assessment/Plan    PT Assessment Patient needs continued PT services  PT Diagnosis Difficulty walking;Abnormality of gait;Generalized weakness   PT Problem List Decreased strength;Decreased activity tolerance;Decreased balance;Decreased mobility;Pain;Decreased  knowledge of use of DME  PT Treatment Interventions DME instruction;Gait training;Functional mobility training;Therapeutic activities;Patient/family education;Balance training;Therapeutic exercise   PT Goals (Current goals can be found in the Care Plan section) Acute Rehab PT Goals Patient Stated Goal: to feel better.  PT Goal Formulation: With patient Time For Goal Achievement: 12/29/15 Potential to Achieve Goals: Good    Frequency Min 3X/week   Barriers to discharge        Co-evaluation               End of  Session Equipment Utilized During Treatment: Gait belt Activity Tolerance: Patient limited by fatigue (limited by dizziness, weakness) Patient left: in chair;with call bell/phone within reach;with chair alarm set      Functional Assessment Tool Used: clinical judgement Functional Limitation: Mobility: Walking and moving around Mobility: Walking and Moving Around Current Status [id number]): At least 20 percent but less than 40 percent impaired, limited or restricted Mobility: Walking and Moving Around Goal Status [phone]): At least 1 percent but less than 20 percent impaired, limited or restricted    Time: 1005-1030 PT Time Calculation (min) (ACUTE ONLY): 25 min   Charges:   PT Evaluation $PT Eval Low Complexity: 1 Procedure PT Treatments $Gait Training: 8-22 mins   PT G Codes:   PT G-Codes **NOT FOR INPATIENT CLASS** Functional Assessment Tool Used: clinical judgement Functional Limitation: Mobility: Walking and moving around Mobility: Walking and Moving Around Current Status [id number]): At least 20 percent but less than 40 percent impaired, limited or restricted Mobility: Walking and Moving Around Goal Status [phone]): At least 1 percent but less than 20 percent impaired, limited or restricted    Weston Anna, MPT Pager: [phone]

## 2015-12-15 NOTE — Discharge Summary (Signed)
Physician Discharge Summary  [name] [id number] DOB: [birth year] DOA: 12/14/2015  PCP: [name], MD  Admit date: 12/14/2015 Discharge date: 12/15/2015  Time spent: 62minutes  Recommendations for Outpatient Follow-up:  1. Follow-up with Dr. Merlene Pulling has cardiologist on 12/16/2015, his Plavix was held. 2. Follow-up with his gastroenterologist on July 10 follow-up for anemia check a CBC.   Discharge Diagnoses:  Principal Problem:   Anemia Active Problems:   Coronary artery disease   AVM (arteriovenous malformation) of small bowel, acquired with hemorrhage [facility])   Essential hypertension   Discharge Condition: guarded  Diet recommendation: heart healthy diet  Filed Weights   12/14/15 0958 12/14/15 1619  Weight: 107.049 kg (236 lb) 105.6 kg (232 lb 12.9 oz)    History of present illness:  75 year old male with past medical history of CAD, iron deficiency anemia with multiple AVMs on recent endoscopy on May 2017, hypertension that comes in for dizziness and dyspnea on session but denies any chest pain. His baseline hemoglobin is around 10 on the day of admission it was 7.9.  Hospital Course:  Acute on chronic iron deficiency anemia: Had an endoscopy in May 2017 that showed AVMs, his hemoglobin usually runs between 9 and 10 and on admission it was 7.9 reports fatigue and dizziness. His Plavix was held on admission. His stool Hemoccult was negative. He was admitted to telemetry was transfused 2 units of packed red blood cells and his hemoglobin came up to 9.7. Has a follow-up appointment with his cardiologist on 12/16/2015 his Plavix and aspirin were held. His likely due to his AVMs and antiplatelet therapy which were held. He will also follow-up with Dr. Henrene Pastor on 7.10.2017  Other medical problems are stable and no changes were made.  Procedures:  none  Consultations:  none  Discharge Exam: Filed Vitals:   12/15/15 0210 12/15/15 0456  BP: 105/74 100/65   Pulse: 72 76  Temp: 97.7 F (36.5 C) 97.7 F (36.5 C)  Resp: 21 22    General: A&O x3 Cardiovascular: RRR Respiratory: good air movement CTA B/L  Discharge Instructions   Discharge Instructions    Diet - low sodium heart healthy    Complete by:  As directed      Increase activity slowly    Complete by:  As directed           Current Discharge Medication List    CONTINUE these medications which have NOT CHANGED   Details  Ascorbic Acid (VITAMIN C PO) Take 1 tablet by mouth daily as needed (flu like symptopms).    aspirin EC 81 MG tablet Take 81 mg by mouth daily.    clonazePAM (KLONOPIN) 0.5 MG tablet Take 1 tablet (0.5 mg total) by mouth 3 (three) times daily as needed. Qty: 270 tablet, Refills: 1    diclofenac sodium (VOLTAREN) 1 % GEL Apply 1 application topically daily as needed (ankle pain). Qty: 5 Tube, Refills: 10    ezetimibe (ZETIA) 10 MG tablet Take 1 tablet (10 mg total) by mouth daily. Qty: 30 tablet, Refills: 11    fluticasone (FLONASE) 50 MCG/ACT nasal spray Place 1 spray into both nostrils daily. Qty: 48 g, Refills: 3    furosemide (LASIX) 40 MG tablet Take 40 mg by mouth daily as needed for fluid or edema.    levothyroxine (SYNTHROID, LEVOTHROID) 75 MCG tablet Take 1 tablet (75 mcg total) by mouth daily. Qty: 90 tablet, Refills: 0    methocarbamol (ROBAXIN-750) 750 MG tablet Take 0.5  tablets (375 mg total) by mouth every 6 (six) hours as needed for muscle spasms. Qty: 120 tablet, Refills: 5    morphine (MSIR) 30 MG tablet Take 1 tablet (30 mg total) by mouth every 6 (six) hours as needed for severe pain. Qty: 120 tablet, Refills: 0    nitroGLYCERIN (NITROSTAT) 0.4 MG SL tablet PLACE 1 TABLET UNDER TONGUE EVERY 5 MINUTES AS NEEDED FOR CHEST PAIN Qty: 75 tablet, Refills: 3    omeprazole (PRILOSEC) 20 MG capsule Take 1 capsule (20 mg total) by mouth 2 (two) times daily. Qty: 60 capsule, Refills: 11    oxyCODONE-acetaminophen (PERCOCET) 10-325  MG tablet Take 1 tablet by mouth daily as needed (moderate pain). Qty: 120 tablet, Refills: 0    ropinirole (REQUIP) 5 MG tablet TAKE 1 TABLET (5 MG TOTAL) BY MOUTH AT BEDTIME. Qty: 90 tablet, Refills: 3    Tetrahydrozoline HCl (MURINE FOR RED EYES OP) Place 1 drop into both eyes daily as needed (red eyes).      STOP taking these medications     clopidogrel (PLAVIX) 75 MG tablet        Allergies  Allergen Reactions  . Brilinta [Ticagrelor] Shortness Of Breath  . Metoprolol Tartrate Other (See Comments)    Sever chest pains " flat lined patient"  . Shellfish Allergy Anaphylaxis and Hives  . Statins Other (See Comments)    All statins cause myalgias   . Zolpidem Other (See Comments)    Chest pain  . Levaquin [Levofloxacin] Hives  . Trazodone And Nefazodone Other (See Comments)    Unsteady on feet   Follow-up Information    Follow up with Silvano Rusk, MD.   Specialty:  Gastroenterology   Why:  follow up with GI on July 10 8:30 pm   Contact information:   [address] [place] [address] [phone]        The results of significant diagnostics from this hospitalization (including imaging, microbiology, ancillary and laboratory) are listed below for reference.    Significant Diagnostic Studies: No results found.  Microbiology: No results found for this or any previous visit (from the past 240 hour(s)).   Labs: Basic Metabolic Panel:  Recent Labs Lab 12/12/15 1105 12/14/15 1052 12/15/15 0432  NA 136 138 140  K 4.1 3.9 3.7  CL 103 106 102  CO2 25 27 30   GLUCOSE 96 130* 108*  BUN 17 17 17   CREATININE 1.01 0.99 1.14  CALCIUM 9.2 8.7* 8.7*   Liver Function Tests: No results for input(s): AST, ALT, ALKPHOS, BILITOT, PROT, ALBUMIN in the last 168 hours. No results for input(s): LIPASE, AMYLASE in the last 168 hours. No results for input(s): AMMONIA in the last 168 hours. CBC:  Recent Labs Lab 12/12/15 1105 12/14/15 1052 12/15/15 0432  WBC  10.1 8.9 9.9  NEUTROABS 6.0  --   --   HGB 8.5 Repeated and verified X2.* 7.9* 9.7*  HCT 29.0* 29.2* 33.9*  MCV 65.7 Repeated and verified X2.* 70.7* 73.1*  PLT 422.0* 404* 355   Cardiac Enzymes: No results for input(s): CKTOTAL, CKMB, CKMBINDEX, TROPONINI in the last 168 hours. BNP: BNP (last 3 results) No results for input(s): BNP in the last 8760 hours.  ProBNP (last 3 results) No results for input(s): PROBNP in the last 8760 hours.  CBG: No results for input(s): GLUCAP in the last 168 hours.     Signed:  Charlynne Cousins MD.  [facility] 12/15/2015, 8:59 AM

## 2015-12-16 ENCOUNTER — Ambulatory Visit (INDEPENDENT_AMBULATORY_CARE_PROVIDER_SITE_OTHER): Payer: Medicare Other | Admitting: Physician Assistant

## 2015-12-16 ENCOUNTER — Encounter: Payer: Self-pay | Admitting: Physician Assistant

## 2015-12-16 VITALS — BP 126/76 | HR 74 | Ht 70.0 in | Wt 237.4 lb

## 2015-12-16 DIAGNOSIS — I2510 Atherosclerotic heart disease of native coronary artery without angina pectoris: Secondary | ICD-10-CM

## 2015-12-16 DIAGNOSIS — Z9861 Coronary angioplasty status: Secondary | ICD-10-CM

## 2015-12-16 DIAGNOSIS — R002 Palpitations: Secondary | ICD-10-CM

## 2015-12-16 DIAGNOSIS — R42 Dizziness and giddiness: Secondary | ICD-10-CM | POA: Diagnosis not present

## 2015-12-16 DIAGNOSIS — R001 Bradycardia, unspecified: Secondary | ICD-10-CM

## 2015-12-16 DIAGNOSIS — D509 Iron deficiency anemia, unspecified: Secondary | ICD-10-CM

## 2015-12-16 DIAGNOSIS — I10 Essential (primary) hypertension: Secondary | ICD-10-CM

## 2015-12-16 NOTE — Progress Notes (Signed)
Cardiology Office Note    Date:  12/16/2015  ID:  BARRI NORELL, DOB [birth year], MRN [id number] PCP:  [name], MD  Cardiologist:  [name]   Chief Complaint: f/u dizziness  History of Present Illness:  [name] is a 75 y.o. male with history of CAD (s/p PCI 10/2014 with DES to [place], patent by relook 11/2014), HTN, HLD (statin intolerant), OSA, bradycardia (7 sec pause nocturnally in 2011), GIB/AVM s/p coagulation/ablation, remote gastritis, prior tobacco abuse, chronic diastolic CHF with LE previously treated with Lasix (neg LE venous duplex in 2016) who presents for f/u. Last echo 10/2014: EF 55-60%, grade 1 DD. Last cath was in 11/2014 as above with patent stents - at that time Brilinta was changed to Plavix and symptoms resolved. He underwent Holter monitoring for dizziness in 12/2014 with PACs and PVCs. He called in on 12/01/15 complaining of palpitations and dizziness and event monitoring was offered. He underwent colonoscopy on 12/06/15 revealing diverticula and polyps as well as non-bleeding angiodysplastic lesions in duodenum and jejunum which were treated with APC. His PCP evaluated him for lightheadedness, checked labs and found him to be significantly anemic with Hgb of 8.5, microcytic, and advised he proceed to the ED. He received 2 U PRBC. Hemoccult was negative. Internal medicine held his Plavix and continued his aspirin.   Preliminary monitor report has shown sinus bradycardia to sinus tachycardia with sinus arrhythmia, 4 pauses greater than 3 seconds, the longest being 5.25 seconds, and occurrences of blocked P waves. There have also been PACs and PVCs. The occurrences of bradycardia have happened between 11pm and 4:40am. The patient is not sure if he was sleeping during this time as he sleeps very inconsistently. He does not wear CPAP because he is intolerant [employer] revoked device due to noncompliance). He reports SOB that improved after blood transfusion.  No chest pain. He currently reports on and off nausea for one week. No syncope (but in the hospital prior to transfusion he did feel pre-syncopal.) He denies any acute dizziness today.   Past Medical History  Diagnosis Date  . Anemia   . Hyperlipidemia   . Hypertension   . Allergy   . Myocardial infarction ([place])   . GERD (gastroesophageal reflux disease)   . Restless leg syndrome   . Hypothyroidism   . Headache(784.0)   . CAD (coronary artery disease)     a. Nonobst disease by cath 2009. b. s/p PCI 10/2014 with DES to [place], patent by relook 11/2014 (Brilinta changed to Plavix with improved sx).  . Gastritis 2011  . Bronchitis   . Back pain     lower back pain."inflammed disc" pain meds for this  . Diverticulosis   . Adenomatous polyp of colon 2007  . AVM (arteriovenous malformation) 2011    a. S/p argon plasma coagulation and ablation in 2011, 2017.  . Statin intolerance   . Bradycardia     a. H/o almost 7sec pause nocturnally during 2011 admission. Also has h/o fatigue with BB.  . Depression   . Transfusion history     several years ago -GI bleed  . Obstructive sleep apnea     -no cpap use  . Chronic diastolic CHF (congestive heart failure) ([place])   . PVC's (premature ventricular contractions) Holter 2016  . Premature atrial contractions Holter 2016    Past Surgical History  Procedure Laterality Date  . Appendectomy    . Hernia repair    . Tonsillectomy    .  Ankle surgery Left   . Colonoscopy  08-23-05    per Dr. Deatra Ina, adenomatous polyps, repeat in 5 yrs   . Esophagogastroduodenoscopy  08-23-05    per Dr. Deatra Ina, cauterized jejunal AVMs   . Skin graft Right     leg  . Left heart catheterization with coronary angiogram N/A 09/08/2012    Procedure: LEFT HEART CATHETERIZATION WITH CORONARY ANGIOGRAM;  Surgeon: Burnell Blanks, MD;  Location: [facility] CATH LAB;  Service: Cardiovascular;  Laterality: N/A;  . Left heart catheterization with coronary angiogram N/A  11/16/2014    Procedure: LEFT HEART CATHETERIZATION WITH CORONARY ANGIOGRAM;  Surgeon: Leonie Man, MD;  Location: [facility] CATH LAB;  Service: Cardiovascular;  Laterality: N/A;  . Cardiac catheterization N/A 11/26/2014    Procedure: Left Heart Cath and Coronary Angiography;  Surgeon: Burnell Blanks, MD;  Location: [facility] LAB;  Service: Cardiovascular;  Laterality: N/A;  . Enteroscopy N/A 12/06/2015    Procedure: ENTEROSCOPY;  Surgeon: Doran Stabler, MD;  Location: [facility] ENDOSCOPY;  Service: Gastroenterology;  Laterality: N/A;  . Hot hemostasis N/A 12/06/2015    Procedure: HOT HEMOSTASIS (ARGON PLASMA COAGULATION/BICAP);  Surgeon: Doran Stabler, MD;  Location: [facility] ENDOSCOPY;  Service: Gastroenterology;  Laterality: N/A;  . Colonoscopy with propofol N/A 12/06/2015    Procedure: COLONOSCOPY WITH PROPOFOL;  Surgeon: Doran Stabler, MD;  Location: [facility] ENDOSCOPY;  Service: Gastroenterology;  Laterality: N/A;    Current Medications: Outpatient Prescriptions Prior to Visit  Medication Sig Dispense Refill  . Ascorbic Acid (VITAMIN C PO) Take 1 tablet by mouth daily as needed (flu like symptopms).    [name] aspirin EC 81 MG tablet Take 81 mg by mouth daily.    . diclofenac sodium (VOLTAREN) 1 % GEL Apply 1 application topically daily as needed (ankle pain). 5 Tube 10  . ezetimibe (ZETIA) 10 MG tablet Take 1 tablet (10 mg total) by mouth daily. 30 tablet 11  . furosemide (LASIX) 40 MG tablet Take 40 mg by mouth daily as needed for fluid or edema.    [name] levothyroxine (SYNTHROID, LEVOTHROID) 75 MCG tablet Take 1 tablet (75 mcg total) by mouth daily. 90 tablet 0  . morphine (MSIR) 30 MG tablet Take 1 tablet (30 mg total) by mouth every 6 (six) hours as needed for severe pain. 120 tablet 0  . nitroGLYCERIN (NITROSTAT) 0.4 MG SL tablet PLACE 1 TABLET UNDER TONGUE EVERY 5 MINUTES AS NEEDED FOR CHEST PAIN 75 tablet 3  . omeprazole (PRILOSEC) 20 MG capsule Take 1 capsule (20 mg total) by mouth 2 (two)  times daily. 60 capsule 11  . oxyCODONE-acetaminophen (PERCOCET) 10-325 MG tablet Take 1 tablet by mouth daily as needed (moderate pain). 120 tablet 0  . ropinirole (REQUIP) 5 MG tablet TAKE 1 TABLET (5 MG TOTAL) BY MOUTH AT BEDTIME. 90 tablet 3  . Tetrahydrozoline HCl (MURINE FOR RED EYES OP) Place 1 drop into both eyes daily as needed (red eyes).    . clonazePAM (KLONOPIN) 0.5 MG tablet Take 1 tablet (0.5 mg total) by mouth 3 (three) times daily as needed. (Patient taking differently: Take 0.5 mg by mouth 3 (three) times daily as needed for anxiety. ) 270 tablet 1  . fluticasone (FLONASE) 50 MCG/ACT nasal spray Place 1 spray into both nostrils daily. (Patient taking differently: Place 1 spray into both nostrils daily as needed for allergies. ) 48 g 3  . methocarbamol (ROBAXIN-750) 750 MG tablet Take 0.5 tablets (375 mg total) by  mouth every 6 (six) hours as needed for muscle spasms. (Patient taking differently: Take 375-750 mg by mouth every 6 (six) hours as needed for muscle spasms. ) 120 tablet 5   No facility-administered medications prior to visit.     Allergies:   Brilinta; Metoprolol tartrate; Shellfish allergy; Statins; Zolpidem; Levaquin; and Trazodone and nefazodone   Social History   Social History  . Marital Status: Married    Spouse Name: N/A  . Number of Children: 1  . Years of Education: N/A   Occupational History  . Disabled    Social History Main Topics  . Smoking status: Former Smoker -- 2.00 packs/day for 50 years    Types: Cigarettes    Quit date: 07/23/2002  . Smokeless tobacco: Never Used  . Alcohol Use: No  . Drug Use: No  . Sexual Activity: Not Asked   Other Topics Concern  . None   Social History Narrative     Family History:  The patient's *family history includes Alcoholism in his maternal grandfather and paternal uncle; Heart attack in his father; Heart disease in his father; Leukemia in his mother; Stroke in his father.   ROS:   Please see the  history of present illness.   All other systems are reviewed and otherwise negative.    PHYSICAL EXAM:   VS:  BP 126/76 mmHg  Pulse 74  Ht 5\' 10"  (1.778 m)  Wt 237 lb 6.4 oz (107.684 kg)  BMI 34.06 kg/m2  BMI: Body mass index is 34.06 kg/(m^2). GEN: Well nourished, well developed obese WM in no acute distress HEENT: normocephalic, atraumatic Neck: no JVD, carotid bruits, or masses Cardiac: RRR; occ ectopy, no murmurs, rubs, or gallops, no edema  Respiratory:  Diminished BS throughout, no wheezes or rhonchi, normal work of breathing GI: soft, nontender, nondistended, + BS MS: no deformity or atrophy Skin: warm and dry, no rash Neuro:  Alert and Oriented x 3, Strength and sensation are intact, follows commands Psych: euthymic mood, full affect  Wt Readings from Last 3 Encounters:  12/16/15 237 lb 6.4 oz (107.684 kg)  12/14/15 232 lb 12.9 oz (105.6 kg)  12/12/15 238 lb (107.956 kg)      Studies/Labs Reviewed:   EKG:  EKG was ordered today and personally reviewed by me. The EKG ordered today demonstrates NSR with sinus arrhythmia and occasional PVCs, nonspecific T wave abnormality, TWI avL  Recent Labs: 09/30/2015: ALT 14; TSH 48.88* 12/15/2015: BUN 17; Creatinine, Ser 1.14; Hemoglobin 9.7*; Platelets 355; Potassium 3.7; Sodium 140   Lipid Panel    Component Value Date/Time   CHOL 138 05/16/2015 0732   TRIG 154* 05/16/2015 0732   HDL 32* 05/16/2015 0732   CHOLHDL 4.3 05/16/2015 0732   VLDL 31* 05/16/2015 0732   LDLCALC 75 05/16/2015 0732    Additional studies/ records that were reviewed today include: Summarized above.    ASSESSMENT & PLAN:   1. Lightheadness/dizziness - possibly multifactorial. See below. 2. Bradycardia - event monitor with findings of bradycardia and pauses particularly during what sleeping hours, although he reports broken sleep. Suspect may be related to untreated OSA. He is unwilling to reconsider CPAP. He has bradycardia in the past on  monitoring as well. He does not appear to have had any daytime events. I reviewed strips with Dr. [name] who recommends f/u evaluation with electrophysiology. ER precautions/warning signs reviewed with patient. Of note recent TSH was high; PCP adjusted levothyroxine with plans to repeat in June. The patient has f/u  next week and I have asked him to have his primary doctor share those results with our office. 3. Microcytic anemia - he reports close f/u with PCP next week. Has h/o AVMs. 4. Palpitations - see above regarding monitor.  5. Coronary artery disease s/p PCI - no recent angina. He remains off Plavix at present time while workup for anemia is pending. Continue ASA. No BB due to bradycardia. Not on statin due to intolerance. Continue to follow. 6. Essential HTN - initial BP was elevated per nurse tech eval, but f/u BP by me was normal.  Disposition: F/u with EP next available. Will tentatively schedule f/u with Dr. [name] in 3 months as well.  Medication Adjustments/Labs and Tests Ordered: Current medicines are reviewed at length with the patient today.  Concerns regarding medicines are outlined above. Medication changes, Labs and Tests ordered today are listed in the Patient Instructions below.   Raechel Ache PA-C  12/16/2015 8:57 AM    [address], [place], [place]  [address] Phone: [phone]; Fax: [phone]

## 2015-12-16 NOTE — Patient Instructions (Signed)
Medication Instructions:  Your physician recommends that you continue on your current medications as directed. Please refer to the Current Medication list given to you today.  Labwork: Please have your primary doctor (Dr. Sarajane Jews) check your thyroid levels and send results to our office.  Testing/Procedures: None ordered  Follow-Up: You have been referred to EP for bradycardia (low heart rate)  Your physician recommends that you schedule a follow-up appointment in: 3 months with Dr. [name].  If you need a refill on your cardiac medications before your next appointment, please call your pharmacy.  Thank you for choosing [facility]!!     Any Other Special Instructions Will Be Listed Below (If Applicable). Bradycardia Bradycardia is a slower-than-normal heart rate. A normal resting heart rate for an adult ranges from 60 to 100 beats per minute. With bradycardia, the resting heart rate is less than 60 beats per minute. Bradycardia is a problem if your heart cannot pump enough oxygen-rich blood through your body. Bradycardia is not a problem for everyone. For some healthy adults, a slow resting heart rate is normal.  CAUSES  Bradycardia may be caused by:  A problem with the heart's electrical system, such as heart block.  A problem with the heart's natural pacemaker (sinus node).  Heart disease, damage, or infection.  Certain medicines that treat heart conditions.  Certain conditions, such as hypothyroidism and obstructive sleep apnea. RISK FACTORS  Risk factors include:  Being 32 or older.  Having high blood pressure (hypertension), high cholesterol (hyperlipidemia), or diabetes.  Drinking heavily, using tobacco products, or using drugs.  Being stressed. SIGNS AND SYMPTOMS  Signs and symptoms include:  Light-headedness.  Faintingor near fainting.  Fatigue and weakness.  Shortness of breath.  Chest pain  (angina).  Drowsiness.  Confusion.  Dizziness. DIAGNOSIS  Diagnosis of bradycardia may include:  A physical exam.  An electrocardiogram (ECG).  Blood tests. TREATMENT  Treatment for bradycardia may include:  Treatment of an underlying condition.  Pacemaker placement. A pacemaker is a small, battery-powered device that is placed under the skin and is programmed to sense your heartbeats. If your heart rate is lower than the programmed rate, the pacemaker will pace your heart.  Changing your medicines or dosages. HOME CARE INSTRUCTIONS  Take medicines only as directed by your health care provider.  Manage any health conditions that contribute to bradycardia as directed by your health care provider.  Follow a heart-healthy diet. A dietitian can help educate you on healthy food options and changes.  Follow an exercise program approved by your health care provider.  Maintain a healthy weight. Lose weight as approved by your health care provider.  Do not use tobacco products, including cigarettes, chewing tobacco, or electronic cigarettes. If you need help quitting, ask your health care provider.  Do not use illegal drugs.  Limit alcohol intake to no more than 1 drink per day for nonpregnant women and 2 drinks per day for men. One drink equals 12 ounces of beer, 5 ounces of wine, or 1 ounces of hard liquor.  Keep all follow-up visits as directed by your health care provider. This is important. SEEK MEDICAL CARE IF:  You feel light-headed or dizzy.  You almost faint.  You feel weak or are easily fatigued during physical activity.  You experience confusion or have memory problems. SEEK IMMEDIATE MEDICAL CARE IF:   You faint.  You have an irregular heartbeat.  You have chest pain.  You have trouble breathing. MAKE SURE YOU:  Understand these instructions.  Will watch your condition.  Will get help right away if you are not doing well or get worse.   This  information is not intended to replace advice given to you by your health care provider. Make sure you discuss any questions you have with your health care provider.   Document Released: 03/31/2002 Document Revised: 07/30/2014 Document Reviewed: 10/14/2013 Elsevier Interactive Patient Education [employer].

## 2015-12-26 ENCOUNTER — Observation Stay (HOSPITAL_COMMUNITY)
Admission: EM | Admit: 2015-12-26 | Discharge: 2015-12-29 | Disposition: A | Discharge status: Home / Self Care | Payer: Medicare Other | Attending: Internal Medicine | Admitting: Internal Medicine

## 2015-12-26 ENCOUNTER — Emergency Department (HOSPITAL_COMMUNITY): Payer: Medicare Other

## 2015-12-26 ENCOUNTER — Telehealth: Payer: Self-pay | Admitting: Family Medicine

## 2015-12-26 ENCOUNTER — Encounter (HOSPITAL_COMMUNITY): Payer: Self-pay | Admitting: Emergency Medicine

## 2015-12-26 DIAGNOSIS — R195 Other fecal abnormalities: Secondary | ICD-10-CM | POA: Diagnosis not present

## 2015-12-26 DIAGNOSIS — R531 Weakness: Secondary | ICD-10-CM | POA: Diagnosis not present

## 2015-12-26 DIAGNOSIS — R0789 Other chest pain: Secondary | ICD-10-CM | POA: Diagnosis not present

## 2015-12-26 DIAGNOSIS — I5032 Chronic diastolic (congestive) heart failure: Secondary | ICD-10-CM | POA: Diagnosis not present

## 2015-12-26 DIAGNOSIS — R0602 Shortness of breath: Secondary | ICD-10-CM | POA: Diagnosis not present

## 2015-12-26 DIAGNOSIS — I110 Hypertensive heart disease with heart failure: Secondary | ICD-10-CM | POA: Insufficient documentation

## 2015-12-26 DIAGNOSIS — Z7951 Long term (current) use of inhaled steroids: Secondary | ICD-10-CM | POA: Insufficient documentation

## 2015-12-26 DIAGNOSIS — Z7982 Long term (current) use of aspirin: Secondary | ICD-10-CM | POA: Diagnosis not present

## 2015-12-26 DIAGNOSIS — I252 Old myocardial infarction: Secondary | ICD-10-CM | POA: Diagnosis not present

## 2015-12-26 DIAGNOSIS — I2510 Atherosclerotic heart disease of native coronary artery without angina pectoris: Secondary | ICD-10-CM | POA: Insufficient documentation

## 2015-12-26 DIAGNOSIS — E538 Deficiency of other specified B group vitamins: Secondary | ICD-10-CM | POA: Insufficient documentation

## 2015-12-26 DIAGNOSIS — G4733 Obstructive sleep apnea (adult) (pediatric): Secondary | ICD-10-CM | POA: Diagnosis not present

## 2015-12-26 DIAGNOSIS — Z7902 Long term (current) use of antithrombotics/antiplatelets: Secondary | ICD-10-CM | POA: Insufficient documentation

## 2015-12-26 DIAGNOSIS — K219 Gastro-esophageal reflux disease without esophagitis: Secondary | ICD-10-CM | POA: Diagnosis not present

## 2015-12-26 DIAGNOSIS — R0609 Other forms of dyspnea: Secondary | ICD-10-CM | POA: Diagnosis not present

## 2015-12-26 DIAGNOSIS — Z79891 Long term (current) use of opiate analgesic: Secondary | ICD-10-CM | POA: Diagnosis not present

## 2015-12-26 DIAGNOSIS — D509 Iron deficiency anemia, unspecified: Secondary | ICD-10-CM | POA: Diagnosis not present

## 2015-12-26 DIAGNOSIS — D649 Anemia, unspecified: Secondary | ICD-10-CM

## 2015-12-26 DIAGNOSIS — Z79899 Other long term (current) drug therapy: Secondary | ICD-10-CM | POA: Diagnosis not present

## 2015-12-26 DIAGNOSIS — E785 Hyperlipidemia, unspecified: Secondary | ICD-10-CM | POA: Diagnosis not present

## 2015-12-26 DIAGNOSIS — I10 Essential (primary) hypertension: Secondary | ICD-10-CM | POA: Diagnosis present

## 2015-12-26 DIAGNOSIS — R079 Chest pain, unspecified: Secondary | ICD-10-CM | POA: Diagnosis not present

## 2015-12-26 DIAGNOSIS — E039 Hypothyroidism, unspecified: Secondary | ICD-10-CM | POA: Diagnosis not present

## 2015-12-26 DIAGNOSIS — Z955 Presence of coronary angioplasty implant and graft: Secondary | ICD-10-CM | POA: Diagnosis not present

## 2015-12-26 DIAGNOSIS — Z87891 Personal history of nicotine dependence: Secondary | ICD-10-CM | POA: Insufficient documentation

## 2015-12-26 DIAGNOSIS — R42 Dizziness and giddiness: Secondary | ICD-10-CM | POA: Insufficient documentation

## 2015-12-26 DIAGNOSIS — I25119 Atherosclerotic heart disease of native coronary artery with unspecified angina pectoris: Secondary | ICD-10-CM | POA: Diagnosis present

## 2015-12-26 LAB — COMPREHENSIVE METABOLIC PANEL
ALBUMIN: 4.6 g/dL (ref 3.5–5.0)
ALK PHOS: 51 U/L (ref 38–126)
ALT: 21 U/L (ref 17–63)
ANION GAP: 8 (ref 5–15)
AST: 23 U/L (ref 15–41)
BUN: 15 mg/dL (ref 6–20)
CALCIUM: 9.2 mg/dL (ref 8.9–10.3)
CHLORIDE: 105 mmol/L (ref 101–111)
CO2: 26 mmol/L (ref 22–32)
Creatinine, Ser: 0.89 mg/dL (ref 0.61–1.24)
GFR calc non Af Amer: >60 mL/min (ref >60)
GLUCOSE: 102 mg/dL — AB (ref 65–99)
POTASSIUM: 4.1 mmol/L (ref 3.5–5.1)
SODIUM: 139 mmol/L (ref 135–145)
Total Bilirubin: 1.1 mg/dL (ref 0.3–1.2)
Total Protein: 7.5 g/dL (ref 6.5–8.1)

## 2015-12-26 LAB — CBC
HEMATOCRIT: 41.7 % (ref 39.0–52.0)
HEMOGLOBIN: 12.2 g/dL — AB (ref 13.0–17.0)
MCH: 22.5 pg — AB (ref 26.0–34.0)
MCHC: 29.3 g/dL — AB (ref 30.0–36.0)
MCV: 76.9 fL — AB (ref 78.0–100.0)
Platelets: 240 10*3/uL (ref 150–400)
RBC: 5.42 MIL/uL (ref 4.22–5.81)
RDW: 26.2 % — ABNORMAL HIGH (ref 11.5–15.5)
WBC: 8 10*3/uL (ref 4.0–10.5)

## 2015-12-26 LAB — TROPONIN I: Troponin I: <0.03 ng/mL (ref <0.031)

## 2015-12-26 LAB — PROTIME-INR
INR: 1.07 (ref 0.00–1.49)
PROTHROMBIN TIME: 13.7 s (ref 11.6–15.2)

## 2015-12-26 LAB — TYPE AND SCREEN
ABO/RH(D): O POS
Antibody Screen: NEGATIVE

## 2015-12-26 LAB — I-STAT TROPONIN, ED: TROPONIN I, POC: 0 ng/mL (ref 0.00–0.08)

## 2015-12-26 LAB — TSH: TSH: 23.568 u[IU]/mL — AB (ref 0.350–4.500)

## 2015-12-26 LAB — POC OCCULT BLOOD, ED: FECAL OCCULT BLD: POSITIVE — AB

## 2015-12-26 MED ORDER — CYANOCOBALAMIN 1000 MCG/ML IJ SOLN
1000.0000 ug | Freq: Every day | INTRAMUSCULAR | Status: DC
  Administered 2015-12-27 – 2015-12-29 (×3): 1000 ug via INTRAMUSCULAR
  Filled 2015-12-26 (×4): qty 1

## 2015-12-26 MED ORDER — LEVOTHYROXINE SODIUM 50 MCG PO TABS
75.0000 ug | ORAL_TABLET | Freq: Every day | ORAL | Status: DC
  Administered 2015-12-27: 75 ug via ORAL
  Filled 2015-12-26: qty 1

## 2015-12-26 MED ORDER — ASPIRIN 81 MG PO CHEW
324.0000 mg | CHEWABLE_TABLET | Freq: Once | ORAL | Status: AC
  Administered 2015-12-26: 324 mg via ORAL
  Filled 2015-12-26: qty 4

## 2015-12-26 MED ORDER — ASPIRIN EC 81 MG PO TBEC
81.0000 mg | DELAYED_RELEASE_TABLET | Freq: Every day | ORAL | Status: DC
  Administered 2015-12-27 – 2015-12-29 (×3): 81 mg via ORAL
  Filled 2015-12-26 (×3): qty 1

## 2015-12-26 MED ORDER — MORPHINE SULFATE (PF) 2 MG/ML IV SOLN: 2.0000 mg | INTRAVENOUS | Status: DC | PRN

## 2015-12-26 MED ORDER — OXYCODONE-ACETAMINOPHEN 5-325 MG PO TABS
1.0000 | ORAL_TABLET | Freq: Every day | ORAL | Status: DC | PRN
  Administered 2015-12-26 – 2015-12-27 (×2): 1 via ORAL
  Filled 2015-12-26 (×2): qty 1

## 2015-12-26 MED ORDER — ACETAMINOPHEN 325 MG PO TABS: 650.0000 mg | ORAL_TABLET | ORAL | Status: DC | PRN

## 2015-12-26 MED ORDER — OXYCODONE-ACETAMINOPHEN 10-325 MG PO TABS: 1.0000 | ORAL_TABLET | Freq: Every day | ORAL | Status: DC | PRN

## 2015-12-26 MED ORDER — ROPINIROLE HCL 1 MG PO TABS
5.0000 mg | ORAL_TABLET | Freq: Every day | ORAL | Status: DC
  Administered 2015-12-26 – 2015-12-28 (×3): 5 mg via ORAL
  Filled 2015-12-26 (×4): qty 5

## 2015-12-26 MED ORDER — EZETIMIBE 10 MG PO TABS
10.0000 mg | ORAL_TABLET | Freq: Every day | ORAL | Status: DC
  Administered 2015-12-26 – 2015-12-28 (×3): 10 mg via ORAL
  Filled 2015-12-26 (×3): qty 1

## 2015-12-26 MED ORDER — PANTOPRAZOLE SODIUM 40 MG PO TBEC
40.0000 mg | DELAYED_RELEASE_TABLET | Freq: Every day | ORAL | Status: DC
  Administered 2015-12-26 – 2015-12-29 (×4): 40 mg via ORAL
  Filled 2015-12-26 (×4): qty 1

## 2015-12-26 MED ORDER — SODIUM CHLORIDE 0.9 % IV SOLN
INTRAVENOUS | Status: AC
  Administered 2015-12-26: 21:00:00 via INTRAVENOUS

## 2015-12-26 MED ORDER — ONDANSETRON HCL 4 MG/2ML IJ SOLN: 4.0000 mg | Freq: Four times a day (QID) | INTRAMUSCULAR | Status: DC | PRN

## 2015-12-26 MED ORDER — OXYCODONE HCL 5 MG PO TABS
5.0000 mg | ORAL_TABLET | Freq: Every day | ORAL | Status: DC | PRN
  Administered 2015-12-26 – 2015-12-27 (×2): 5 mg via ORAL
  Filled 2015-12-26 (×2): qty 1

## 2015-12-26 MED ORDER — NITROGLYCERIN 0.4 MG SL SUBL
0.4000 mg | SUBLINGUAL_TABLET | SUBLINGUAL | Status: DC | PRN
  Administered 2015-12-26: 0.4 mg via SUBLINGUAL
  Filled 2015-12-26: qty 1

## 2015-12-26 MED ORDER — GI COCKTAIL ~~LOC~~: 30.0000 mL | Freq: Four times a day (QID) | ORAL | Status: DC | PRN

## 2015-12-26 MED ORDER — CLONAZEPAM 0.5 MG PO TABS: 0.5000 mg | ORAL_TABLET | Freq: Three times a day (TID) | ORAL | Status: DC | PRN

## 2015-12-26 NOTE — ED Notes (Signed)
Pt refuses second dose of nitroglycerin.

## 2015-12-26 NOTE — H&P (Signed)
History and Physical    [name] [id number] DOB: February 19, 1941 DOA: 12/26/2015  PCP: [name], MD  Cardiology: [name] GI: [facility]  Patient coming from: Home  Chief Complaint: DOE, dizziness  HPI: [name] is a 75 y.o. gentleman with a history of CAD S/P 2 prior stents (currently on ASA alone, plavix held recently due to GI bleed), chronic GI bleeding secondary to AVMs, HTN, HLD (intolerant to statins), diastolic heart failure, and probable OSA (formerly on CPAP but has not had a qualifying sleep study for insurance to cover) who has had several weeks of DOE, some shortness of breath at rest, dizziness, and increased fatigue/generalized weakness.  He has not had palpitations.  No chest pain prior to arrival today.  No syncope.  No cough.  No nausea or vomiting.  No progressive lower extremity edema or increased abdominal girth.  No weight gain.  He was admitted in late May for symptomatic anemia, and felt markedly improved (for approximately one week) after blood transfusion.  Symptoms have now recurred, and he was encouraged to present to the ED by his PCP.  ED Course: He had an episode of 6 out of 10 substernal chest pain in the ED that he believes was provoked by a blood draw.  EKG does not show any acute ST segment changes.  He received full strength aspirin and SL NTG x 1.  Symptoms have resolved.  He has a positive FOBT but he has not had any signs of active GI bleeding.  Hgb is 12, which is much improved from prior.  Hospitalist asked to admit for observation.  Review of Systems: As per HPI otherwise 10 point review of systems negative.    Past Medical History  Diagnosis Date  . Anemia   . Hyperlipidemia   . Hypertension   . Allergy   . Myocardial infarction ([place])   . GERD (gastroesophageal reflux disease)   . Restless leg syndrome   . Hypothyroidism   . Headache(784.0)   . CAD (coronary artery disease)     a. Nonobst disease by cath 2009. b. s/p PCI 10/2014  with DES to [place], patent by relook 11/2014 (Brilinta changed to Plavix with improved sx).  . Gastritis 2011  . Bronchitis   . Back pain     lower back pain."inflammed disc" pain meds for this  . Diverticulosis   . Adenomatous polyp of colon 2007  . AVM (arteriovenous malformation) 2011    a. S/p argon plasma coagulation and ablation in 2011, 2017.  . Statin intolerance   . Bradycardia     a. H/o almost 7sec pause nocturnally during 2011 admission. Also has h/o fatigue with BB.  . Depression   . Transfusion history     several years ago -GI bleed  . Obstructive sleep apnea     -no cpap use  . Chronic diastolic CHF (congestive heart failure) ([place])   . PVC's (premature ventricular contractions) Holter 2016  . Premature atrial contractions Holter 2016    Past Surgical History  Procedure Laterality Date  . Appendectomy    . Hernia repair    . Tonsillectomy    . Ankle surgery Left   . Colonoscopy  08-23-05    per Dr. Deatra Ina, adenomatous polyps, repeat in 5 yrs   . Esophagogastroduodenoscopy  08-23-05    per Dr. Deatra Ina, cauterized jejunal AVMs   . Skin graft Right     leg  . Left heart catheterization with coronary angiogram N/A  09/08/2012    Procedure: LEFT HEART CATHETERIZATION WITH CORONARY ANGIOGRAM;  Surgeon: Burnell Blanks, MD;  Location: [facility] LAB;  Service: Cardiovascular;  Laterality: N/A;  . Left heart catheterization with coronary angiogram N/A 11/16/2014    Procedure: LEFT HEART CATHETERIZATION WITH CORONARY ANGIOGRAM;  Surgeon: Leonie Man, MD;  Location: [facility] CATH LAB;  Service: Cardiovascular;  Laterality: N/A;  . Cardiac catheterization N/A 11/26/2014    Procedure: Left Heart Cath and Coronary Angiography;  Surgeon: Burnell Blanks, MD;  Location: [place] CV LAB;  Service: Cardiovascular;  Laterality: N/A;  . Enteroscopy N/A 12/06/2015    Procedure: ENTEROSCOPY;  Surgeon: Doran Stabler, MD;  Location: [facility] ENDOSCOPY;  Service: Gastroenterology;   Laterality: N/A;  . Hot hemostasis N/A 12/06/2015    Procedure: HOT HEMOSTASIS (ARGON PLASMA COAGULATION/BICAP);  Surgeon: Doran Stabler, MD;  Location: [facility] ENDOSCOPY;  Service: Gastroenterology;  Laterality: N/A;  . Colonoscopy with propofol N/A 12/06/2015    Procedure: COLONOSCOPY WITH PROPOFOL;  Surgeon: Doran Stabler, MD;  Location: [facility] ENDOSCOPY;  Service: Gastroenterology;  Laterality: N/A;     reports that he quit smoking about 13 years ago. His smoking use included Cigarettes. He has a 100 pack-year smoking history. He has never used smokeless tobacco. He reports that he does not drink alcohol or use illicit drugs. His wife still smokes so is around 2nd hand smoke at home.  No EtOH or illicit drug use.   Allergies  Allergen Reactions  . Brilinta [Ticagrelor] Shortness Of Breath  . Metoprolol Tartrate Other (See Comments)    Sever chest pains " flat lined patient"  . Shellfish Allergy Anaphylaxis and Hives  . Statins Other (See Comments)    All statins cause myalgias   . Zolpidem Other (See Comments)    Chest pain  . Levaquin [Levofloxacin] Hives  . Trazodone And Nefazodone Other (See Comments)    Unsteady on feet    Family History  Problem Relation Age of Onset  . Heart disease Father   . Stroke Father   . Leukemia Mother   . Alcoholism Paternal Uncle   . Alcoholism Maternal Grandfather   . Heart attack Father     Prior to Admission medications   Medication Sig Start Date End Date Taking? Authorizing Provider  Ascorbic Acid (VITAMIN C PO) Take 1 tablet by mouth daily as needed (flu like symptopms).   Yes Historical Provider, MD  aspirin EC 81 MG tablet Take 81 mg by mouth daily.   Yes Historical Provider, MD  clonazePAM (KLONOPIN) 0.5 MG tablet Take 0.5 mg by mouth 3 (three) times daily as needed for anxiety.   Yes Historical Provider, MD  clopidogrel (PLAVIX) 75 MG tablet Take 75 mg by mouth daily.  12/07/15  Yes Historical Provider, MD  diclofenac sodium  (VOLTAREN) 1 % GEL Apply 1 application topically daily as needed (ankle pain). 11/27/14  Yes Rhonda G Barrett, PA-C  ezetimibe (ZETIA) 10 MG tablet Take 1 tablet (10 mg total) by mouth daily. 04/06/15  Yes Burnell Blanks, MD  fluticasone (FLONASE) 50 MCG/ACT nasal spray Place 1 spray into both nostrils daily.   Yes Historical Provider, MD  furosemide (LASIX) 40 MG tablet Take 40 mg by mouth daily as needed for fluid or edema.   Yes Historical Provider, MD  levothyroxine (SYNTHROID, LEVOTHROID) 75 MCG tablet Take 1 tablet (75 mcg total) by mouth daily. 10/04/15  Yes [name], MD  methocarbamol (ROBAXIN) 750 MG tablet  Take 750 mg by mouth 4 (four) times daily as needed for muscle spasms.    Yes Historical Provider, MD  morphine (MSIR) 30 MG tablet Take 1 tablet (30 mg total) by mouth every 6 (six) hours as needed for severe pain. 12/12/15  Yes [name], MD  nitroGLYCERIN (NITROSTAT) 0.4 MG SL tablet PLACE 1 TABLET UNDER TONGUE EVERY 5 MINUTES AS NEEDED FOR CHEST PAIN 04/01/15  Yes Burnell Blanks, MD  omeprazole (PRILOSEC) 20 MG capsule Take 1 capsule (20 mg total) by mouth 2 (two) times daily. Patient taking differently: Take 20 mg by mouth 2 (two) times daily as needed (reflux).  11/30/15  Yes [name], MD  oxyCODONE-acetaminophen (PERCOCET) 10-325 MG tablet Take 1 tablet by mouth daily as needed (moderate pain). 09/30/15  Yes [name], MD  ropinirole (REQUIP) 5 MG tablet TAKE 1 TABLET (5 MG TOTAL) BY MOUTH AT BEDTIME. 10/07/15  Yes [name], MD  Tetrahydrozoline HCl (MURINE FOR RED EYES OP) Place 1 drop into both eyes daily as needed (red eyes).   Yes Historical Provider, MD    Physical Exam: Filed Vitals:   12/26/15 1234 12/26/15 1610 12/26/15 1722  BP: 136/84 141/81 139/86  Pulse: 69 67 64  Temp: 98.2 F (36.8 C)    TempSrc: Oral    Resp: 16 18 16   SpO2: 95% 100% 96%      Constitutional: NAD, calm, comfortable Filed Vitals:   12/26/15 1234 12/26/15 1610  12/26/15 1722  BP: 136/84 141/81 139/86  Pulse: 69 67 64  Temp: 98.2 F (36.8 C)    TempSrc: Oral    Resp: 16 18 16   SpO2: 95% 100% 96%   Eyes: PERRL, lids and conjunctivae normal ENMT: Mucous membranes are moist. Posterior pharynx clear of any exudate or lesions.Normal dentition.  Neck: normal, supple Respiratory: clear to auscultation bilaterally, no wheezing, no crackles. Normal respiratory effort. No accessory muscle use.  Cardiovascular: Regular rate and rhythm, no murmurs / rubs / gallops. No extremity edema. 2+ pedal pulses.  Abdomen: no tenderness, no masses palpated. Bowel sounds positive.  Musculoskeletal: Moves all four extremities spontaneously.  No joint deformity upper and lower extremities. Good ROM, no contractures. Normal muscle tone.  Skin: no rashes, warm and dry Neurologic: CN 2-12 grossly intact.  Strength 5/5 in all 4.  Psychiatric: Normal judgment and insight. Alert and oriented x 3. Normal mood.    Labs on Admission: I have personally reviewed following labs and imaging studies  CBC:  Recent Labs Lab 12/26/15 1301  WBC 8.0  HGB 12.2*  HCT 41.7  MCV 76.9*  PLT [id number]   Basic Metabolic Panel:  Recent Labs Lab 12/26/15 1301  NA 139  K 4.1  CL 105  CO2 26  GLUCOSE 102*  BUN 15  CREATININE 0.89  CALCIUM 9.2   GFR: Estimated Creatinine Clearance: 89.5 mL/min (by C-G formula based on Cr of 0.89). Liver Function Tests:  Recent Labs Lab 12/26/15 1301  AST 23  ALT 21  ALKPHOS 51  BILITOT 1.1  PROT 7.5  ALBUMIN 4.6   Coagulation Profile:  Recent Labs Lab 12/26/15 1247  INR 1.07   Cardiac Enzymes: Troponin 0 Thyroid Function Tests: TSH 48 in March 2017 Anemia Panel: B12 106 in May 2017   Radiological Exams on Admission: Dg Chest 2 View  12/26/2015  CLINICAL DATA:  Mid chest pain and shortness of breath earlier today. Some weakness. Ex-smoker. EXAM: CHEST  2 VIEW COMPARISON:  11/26/2014.  FINDINGS: Stable enlarged cardiac  silhouette. Interval small amount of linear density at both lung bases. Thoracic spine degenerative changes. IMPRESSION: Small amount of interval bibasilar linear atelectasis or scarring. Electronically Signed   By: Claudie Revering M.D.   On: 12/26/2015 18:03    EKG: Independently reviewed. NSR, no acute ST segment changes  Assessment/Plan Principal Problem:   DOE (dyspnea on exertion) Active Problems:   Coronary artery disease   Hypothyroidism   Essential hypertension   Iron deficiency anemia   Chest pain   Dizziness   Vitamin B12 deficiency   Chest pain and DOE; differential includes ACS, valvular heart disease, arrhythmia, untreated OSA --Admit to telemetry --Serial troponin --Repeat EKG in the AM --SL NTG prn for chest pain --Full strength aspirin given in the ED, continue baby aspirin daily.  Plavix held previously for history of GI bleed and symptomatic anemia requiring blood transfusion. --Check echo in the AM (I don't see one in the system since  April 2016) --Recently seen by his cardiologist.  If troponin negative and echo stable, can probably defer further testing to the outpatient setting.  Dizziness; differential includes arrhythmia, carotid disease, CVA, orthostasis.  Hgb of 12 does not support active GI bleed/symptomatic anemia from blood loss at this time.  --Document orthostatic vital signs --Cautious hydration with NS overnight --Carotid ultrasound bilaterally --No focal deficits so I will defer head CT for now --He wore a holter monitor recently; may need to consider another to further screen for arrhytmia.  Defer to cardiology.  Generalized weakness and documented B12 deficiency last month --Start IM B12 injections now; will need follow-up with PCP  History of hypothyroidism --Repeat TFTs now --Continue home dose of levothyroxine for now; may need dose adjustment  History of iron deficiency --Needs to be on a oral supplement at home; apparently, this has not  been recommended previously   DVT prophylaxis: SCDs, high risk of bleeding Code Status: FULL Family Communication: Patient alone at time of admission Disposition Plan: Home when symptoms improved Consults called: NONE Admission status: Observation telemetry   Eber Jones MD [facility]  If 7PM-7AM, please contact night-coverage [email or web address] Password [email or web address]  12/26/2015, 8:00 PM

## 2015-12-26 NOTE — Telephone Encounter (Signed)
[facility] - Client [place] [facility]  Patient Name: [name]  DOB: 22-May-1941    Initial Comment Caller states had a blood transfusion 2 weeks ago, iron low, was having dizzy spells, short of breath. He is having same symptoms this morning   Nurse Assessment  Nurse: Wayne Sever, RN, Tillie Rung Date/Time (Eastern Time): 12/26/2015 9:37:13 AM  Confirm and document reason for call. If symptomatic, describe symptoms. You must click the next button to save text entered. ---Caller states he had a blood transfusion 2 weeks ago. He states he is dizzy and tired again. He states he is having the symptoms again and feels short of breath also. Caller states his blood was low from bleeding internally. Caller denies having any rectal bleeding currently  Has the patient traveled out of the country within the last 30 days? ---Not Applicable  Does the patient have any new or worsening symptoms? ---Yes  Will a triage be completed? ---Yes  Related visit to physician within the last 2 weeks? ---Yes  Does the PT have any chronic conditions? (i.e. diabetes, asthma, etc.) ---Yes  List chronic conditions. ---Bradycardia, Low Thyroid, Heart Damage, Plavix(takes blood thinner)  Is this a behavioral health or substance abuse call? ---No     Guidelines    Guideline Title Affirmed Question Affirmed Notes  Weakness (Generalized) and Fatigue Difficulty breathing    Final Disposition User   Go to ED Now Wayne Sever, RN, Tillie Rung    Comments  Caller was questioning ER outcome, but finally agreed to seek treatment.   Referrals  [facility] - ED   Disagree/Comply: Comply

## 2015-12-26 NOTE — ED Notes (Signed)
Pt reports chest pain 6/10 prior to nitroglycerin administration.

## 2015-12-26 NOTE — ED Provider Notes (Signed)
CSN: [id number]     Arrival date & time 12/26/15  1209 History   First MD Initiated Contact with Patient 12/26/15 1557     Chief Complaint  Patient presents with  . Dizziness  . Melena     (Consider location/radiation/quality/duration/timing/severity/associated sxs/prior Treatment) HPI Comments: ROMARIO CENTRELLA is a 75 y.o. male with history of CAD s/p stent placement on plavix, small bowel AVM s/p ablation, and anemia presents to ED with complaint of dizziness, SOB, DOE, and fatigue. Patient had endoscopy and colonoscopy 12/06/2015 significant for small bowel AVM that was ablated as well as multiple colonic polyps and sigmoid diverticulosis.  He was seen on 12/14/2015 for dizziness, lightheadedness, and SOB and found to have a hemoglobin of 7.9. He underwent transfusion.  Following d/c and over the weekend he reported feeling better; however, on Monday 12/19/15 patient started experiencing shortness of breath, dyspnea on exertion, fatigue, and dizziness prompting him to return to the ED today. Patient denies abdominal pain, nausea, vomiting, diarrhea, constipation, melena, or hematochezia. No fever, chills, night sweats. On initial interview patient denied chest pain; however, on re-evaluation endorsed chest pain 6/10.   Patient is a 75 y.o. male presenting with dizziness. The history is provided by the patient and medical records.  Dizziness Associated symptoms: chest pain ( 6/10 on re-evaluation.), shortness of breath and weakness ( generalized)   Associated symptoms: no blood in stool, no diarrhea, no headaches, no nausea and no vomiting     Past Medical History  Diagnosis Date  . Anemia   . Hyperlipidemia   . Hypertension   . Allergy   . Myocardial infarction ([place])   . GERD (gastroesophageal reflux disease)   . Restless leg syndrome   . Hypothyroidism   . Headache(784.0)   . CAD (coronary artery disease)     a. Nonobst disease by cath 2009. b. s/p PCI 10/2014 with DES to [place], patent by relook 11/2014 (Brilinta changed to Plavix with improved sx).  . Gastritis 2011  . Bronchitis   . Back pain     lower back pain."inflammed disc" pain meds for this  . Diverticulosis   . Adenomatous polyp of colon 2007  . AVM (arteriovenous malformation) 2011    a. S/p argon plasma coagulation and ablation in 2011, 2017.  . Statin intolerance   . Bradycardia     a. H/o almost 7sec pause nocturnally during 2011 admission. Also has h/o fatigue with BB.  . Depression   . Transfusion history     several years ago -GI bleed  . Obstructive sleep apnea     -no cpap use  . Chronic diastolic CHF (congestive heart failure) ([place])   . PVC's (premature ventricular contractions) Holter 2016  . Premature atrial contractions Holter 2016   Past Surgical History  Procedure Laterality Date  . Appendectomy    . Hernia repair    . Tonsillectomy    . Ankle surgery Left   . Colonoscopy  08-23-05    per Dr. Deatra Ina, adenomatous polyps, repeat in 5 yrs   . Esophagogastroduodenoscopy  08-23-05    per Dr. Deatra Ina, cauterized jejunal AVMs   . Skin graft Right     leg  . Left heart catheterization with coronary angiogram N/A 09/08/2012    Procedure: LEFT HEART CATHETERIZATION WITH CORONARY ANGIOGRAM;  Surgeon: Burnell Blanks, MD;  [facility] C[facility];  Service: Cardiovascular;  Laterality: N/A;  . Left heart catheterization with coronary angiogram N/A 11/16/2014  Procedure: LEFT HEART CATHETERIZATION WITH CORONARY ANGIOGRAM;  Surgeon: Leonie Man, MD;  Location: [facility] CATH LAB;  Service: Cardiovascular;  Laterality: N/A;  . Cardiac catheterization N/A 11/26/2014    Procedure: Left Heart Cath and Coronary Angiography;  Surgeon: Burnell Blanks, MD;  Location: [place] CV LAB;  Service: Cardiovascular;  Laterality: N/A;  . Enteroscopy N/A 12/06/2015    Procedure: ENTEROSCOPY;  Surgeon: Doran Stabler, MD;  Location: [facility] ENDOSCOPY;  Service: Gastroenterology;  Laterality: N/A;  .  Hot hemostasis N/A 12/06/2015    Procedure: HOT HEMOSTASIS (ARGON PLASMA COAGULATION/BICAP);  Surgeon: Doran Stabler, MD;  Location: [facility] ENDOSCOPY;  Service: Gastroenterology;  Laterality: N/A;  . Colonoscopy with propofol N/A 12/06/2015    Procedure: COLONOSCOPY WITH PROPOFOL;  Surgeon: Doran Stabler, MD;  Location: [facility] ENDOSCOPY;  Service: Gastroenterology;  Laterality: N/A;   Family History  Problem Relation Age of Onset  . Heart disease Father   . Stroke Father   . Leukemia Mother   . Alcoholism Paternal Uncle   . Alcoholism Maternal Grandfather   . Heart attack Father    Social History  Substance Use Topics  . Smoking status: Former Smoker -- 2.00 packs/day for 50 years    Types: Cigarettes    Quit date: 07/23/2002  . Smokeless tobacco: Never Used  . Alcohol Use: No    Review of Systems  Constitutional: Positive for chills and fatigue. Negative for fever and diaphoresis.  HENT: Negative for sore throat.   Eyes: Negative for visual disturbance.  Respiratory: Positive for shortness of breath.   Cardiovascular: Positive for chest pain ( 6/10 on re-evaluation.).  Gastrointestinal: Negative for nausea, vomiting, abdominal pain, diarrhea, constipation and blood in stool.  Genitourinary: Negative for dysuria and hematuria.  Musculoskeletal: Negative for neck pain and neck stiffness.  Skin: Negative for rash.  Neurological: Positive for dizziness, weakness ( generalized) and light-headedness. Negative for numbness and headaches.      Allergies  Brilinta; Metoprolol tartrate; Shellfish allergy; Statins; Zolpidem; Levaquin; and Trazodone and nefazodone  Home Medications   Prior to Admission medications   Medication Sig Start Date End Date Taking? Authorizing Provider  Ascorbic Acid (VITAMIN C PO) Take 1 tablet by mouth daily as needed (flu like symptopms).   Yes Historical Provider, MD  aspirin EC 81 MG tablet Take 81 mg by mouth daily.   Yes Historical Provider, MD   clonazePAM (KLONOPIN) 0.5 MG tablet Take 0.5 mg by mouth 3 (three) times daily as needed for anxiety.   Yes Historical Provider, MD  clopidogrel (PLAVIX) 75 MG tablet Take 75 mg by mouth daily.  12/07/15  Yes Historical Provider, MD  diclofenac sodium (VOLTAREN) 1 % GEL Apply 1 application topically daily as needed (ankle pain). 11/27/14  Yes Rhonda G Barrett, PA-C  ezetimibe (ZETIA) 10 MG tablet Take 1 tablet (10 mg total) by mouth daily. 04/06/15  Yes Burnell Blanks, MD  fluticasone (FLONASE) 50 MCG/ACT nasal spray Place 1 spray into both nostrils daily.   Yes Historical Provider, MD  furosemide (LASIX) 40 MG tablet Take 40 mg by mouth daily as needed for fluid or edema.   Yes Historical Provider, MD  levothyroxine (SYNTHROID, LEVOTHROID) 75 MCG tablet Take 1 tablet (75 mcg total) by mouth daily. 10/04/15  Yes [name], MD  methocarbamol (ROBAXIN) 750 MG tablet Take 750 mg by mouth 4 (four) times daily as needed for muscle spasms.    Yes Historical Provider, MD  morphine (  MSIR) 30 MG tablet Take 1 tablet (30 mg total) by mouth every 6 (six) hours as needed for severe pain. 12/12/15  Yes [name], MD  nitroGLYCERIN (NITROSTAT) 0.4 MG SL tablet PLACE 1 TABLET UNDER TONGUE EVERY 5 MINUTES AS NEEDED FOR CHEST PAIN 04/01/15  Yes Burnell Blanks, MD  omeprazole (PRILOSEC) 20 MG capsule Take 1 capsule (20 mg total) by mouth 2 (two) times daily. Patient taking differently: Take 20 mg by mouth 2 (two) times daily as needed (reflux).  11/30/15  Yes [name], MD  oxyCODONE-acetaminophen (PERCOCET) 10-325 MG tablet Take 1 tablet by mouth daily as needed (moderate pain). 09/30/15  Yes [name], MD  ropinirole (REQUIP) 5 MG tablet TAKE 1 TABLET (5 MG TOTAL) BY MOUTH AT BEDTIME. 10/07/15  Yes [name], MD  Tetrahydrozoline HCl (MURINE FOR RED EYES OP) Place 1 drop into both eyes daily as needed (red eyes).   Yes Historical Provider, MD   BP 139/86 mmHg  Pulse 64  Temp(Src) 98.2 F  (36.8 C) (Oral)  Resp 16  SpO2 96% Physical Exam  Constitutional: He appears well-developed and well-nourished. No distress.  HENT:  Head: Normocephalic and atraumatic.  Mouth/Throat: Oropharynx is clear and moist. No oropharyngeal exudate.  Eyes: Conjunctivae and EOM are normal. Pupils are equal, round, and reactive to light. Right eye exhibits no discharge. Left eye exhibits no discharge. No scleral icterus.  Neck: Normal range of motion. Neck supple.  Cardiovascular: Normal rate, regular rhythm, normal heart sounds and intact distal pulses.   No murmur heard. Pulmonary/Chest: Effort normal and breath sounds normal. No respiratory distress. He has no wheezes.  Abdominal: Soft. Bowel sounds are normal. There is tenderness ( epigastric and RUQ, RLQ). There is guarding ( epigastric, RLQ, RUQ). There is no rebound.  Obese abdomen noted on exam. Chaperone present for duration of rectal exam. Minimal stool noted in rectal vault. Brown stool noted on exam. No evidence of external hemorrhoids or fissures.   Musculoskeletal: Normal range of motion.  Lymphadenopathy:    He has no cervical adenopathy.  Neurological: He is alert. Coordination normal.  Skin: Skin is warm and dry. He is not diaphoretic.  Psychiatric: He has a normal mood and affect. His behavior is normal.    ED Course  Procedures (including critical care time) Labs Review Labs Reviewed  COMPREHENSIVE METABOLIC PANEL - Abnormal; Notable for the following:    Glucose, Bld 102 (*)    All other components within normal limits  CBC - Abnormal; Notable for the following:    Hemoglobin 12.2 (*)    MCV 76.9 (*)    MCH 22.5 (*)    MCHC 29.3 (*)    RDW 26.2 (*)    All other components within normal limits  POC OCCULT BLOOD, ED - Abnormal; Notable for the following:    Fecal Occult Bld POSITIVE (*)    All other components within normal limits  PROTIME-INR  I-STAT TROPOININ, ED  TYPE AND SCREEN    Imaging Review Dg Chest 2  View  12/26/2015  CLINICAL DATA:  Mid chest pain and shortness of breath earlier today. Some weakness. Ex-smoker. EXAM: CHEST  2 VIEW COMPARISON:  11/26/2014. FINDINGS: Stable enlarged cardiac silhouette. Interval small amount of linear density at both lung bases. Thoracic spine degenerative changes. IMPRESSION: Small amount of interval bibasilar linear atelectasis or scarring. Electronically Signed   By: Claudie Revering M.D.   On: 12/26/2015 18:03   I have personally  reviewed and evaluated these images and lab results as part of my medical decision-making.   EKG Interpretation   Date/Time:  Monday December 26 2015 12:49:30 EDT Ventricular Rate:  69 PR Interval:  169 QRS Duration: 126 QT Interval:  398 QTC Calculation: 426 R Axis:   28 Text Interpretation:  Sinus rhythm Nonspecific intraventricular conduction  delay No significant change since last tracing Confirmed by FLOYD MD,  Quillian Quince [id number]) on 12/26/2015 4:39:35 PM      MDM   Final diagnoses:  Stool guaiac positive  Anemia, unspecified anemia type  Chest pain, unspecified chest pain type   Patient is afebrile and nontoxic. Vital signs are stable. Physical exam remarkable for right-sided and epigastric abdominal tenderness to palpation. Brown stool in appearance on rectal exam. Given medical history, recent anemia requiring blood transfusion, and symptoms concern for possible GI bleed. Will check hemoccult, CBC, CMP, coags, type and screen.   Fecal hemoccult positive. CBC remarkable for low hemoglobin at 12.2; however, significant improvement compared to previous labs. CMP reassuring. Coags normal.  5:18 PM: On re-evaluation patient experiencing chest pain, 6/10. Also endorses a history of exertional chest pain approximately 3 weeks ago with radiation into left arm and jaw. SL NTG and ASA given. Troponin, EKG, and CXR ordered.   7:00 PM: Troponin negative. EKG unchanged from previous. Chest x-ray showing bibasilar atelectasis.  Re-evaluation, chest pain level 2/10 currently after ASA and one dose of SL NTG. Patient still endorses feeling poorly. Heart score is 4. With heart score 4 and symptomatic anemia with positive Hemoccult will consult hospitalists for admission.  Consulted TRH. Will admit to observation telemetry for chest pain rule out and further evaluation and management.     Roxanna Mew, [place] 12/26/15 1958  Carmin Muskrat, MD 12/30/15 [phone]

## 2015-12-26 NOTE — Telephone Encounter (Signed)
FYI

## 2015-12-26 NOTE — ED Notes (Signed)
Attempt to draw istat troponin as ordered via left hand; not sufficent sample collected.

## 2015-12-26 NOTE — ED Notes (Signed)
Pt c/o dizziness, SOB, and dark stools onset last Friday, had cauterization done for GI bleeding last Friday, symptoms improved, then a few days ago symptoms returned. Dizziness occurs on standing, pt feels too weak to ambulate any distance.

## 2015-12-27 ENCOUNTER — Observation Stay (HOSPITAL_COMMUNITY): Payer: Medicare Other

## 2015-12-27 ENCOUNTER — Observation Stay (HOSPITAL_BASED_OUTPATIENT_CLINIC_OR_DEPARTMENT_OTHER): Payer: Medicare Other

## 2015-12-27 DIAGNOSIS — R42 Dizziness and giddiness: Secondary | ICD-10-CM

## 2015-12-27 DIAGNOSIS — R0609 Other forms of dyspnea: Secondary | ICD-10-CM | POA: Diagnosis not present

## 2015-12-27 DIAGNOSIS — D649 Anemia, unspecified: Secondary | ICD-10-CM | POA: Insufficient documentation

## 2015-12-27 DIAGNOSIS — R531 Weakness: Secondary | ICD-10-CM | POA: Diagnosis not present

## 2015-12-27 DIAGNOSIS — I10 Essential (primary) hypertension: Secondary | ICD-10-CM

## 2015-12-27 DIAGNOSIS — E039 Hypothyroidism, unspecified: Secondary | ICD-10-CM | POA: Diagnosis not present

## 2015-12-27 LAB — IRON AND TIBC
IRON: 51 ug/dL (ref 45–182)
Saturation Ratios: 11 % — ABNORMAL LOW (ref 17.9–39.5)
TIBC: 458 ug/dL — AB (ref 250–450)
UIBC: 407 ug/dL

## 2015-12-27 LAB — BASIC METABOLIC PANEL
Anion gap: 7 (ref 5–15)
BUN: 13 mg/dL (ref 6–20)
CHLORIDE: 106 mmol/L (ref 101–111)
CO2: 26 mmol/L (ref 22–32)
CREATININE: 1 mg/dL (ref 0.61–1.24)
Calcium: 8.6 mg/dL — ABNORMAL LOW (ref 8.9–10.3)
GFR calc Af Amer: >60 mL/min (ref >60)
GFR calc non Af Amer: >60 mL/min (ref >60)
GLUCOSE: 117 mg/dL — AB (ref 65–99)
Potassium: 4 mmol/L (ref 3.5–5.1)
SODIUM: 139 mmol/L (ref 135–145)

## 2015-12-27 LAB — T4, FREE: Free T4: 0.7 ng/dL (ref 0.61–1.12)

## 2015-12-27 LAB — CBC
HEMATOCRIT: 39.1 % (ref 39.0–52.0)
HEMOGLOBIN: 11.3 g/dL — AB (ref 13.0–17.0)
MCH: 22.7 pg — AB (ref 26.0–34.0)
MCHC: 28.9 g/dL — AB (ref 30.0–36.0)
MCV: 78.7 fL (ref 78.0–100.0)
Platelets: 215 10*3/uL (ref 150–400)
RBC: 4.97 MIL/uL (ref 4.22–5.81)
RDW: 26.1 % — ABNORMAL HIGH (ref 11.5–15.5)
WBC: 7.6 10*3/uL (ref 4.0–10.5)

## 2015-12-27 LAB — TROPONIN I
Troponin I: <0.03 ng/mL (ref <0.031)
Troponin I: <0.03 ng/mL (ref <0.031)

## 2015-12-27 LAB — FERRITIN: FERRITIN: 17 ng/mL — AB (ref 24–336)

## 2015-12-27 MED ORDER — LEVOTHYROXINE SODIUM 100 MCG PO TABS
100.0000 ug | ORAL_TABLET | Freq: Every day | ORAL | Status: DC
  Administered 2015-12-28 – 2015-12-29 (×2): 100 ug via ORAL
  Filled 2015-12-27 (×2): qty 1

## 2015-12-27 MED ORDER — LEVOTHYROXINE SODIUM 25 MCG PO TABS
25.0000 ug | ORAL_TABLET | Freq: Once | ORAL | Status: AC
  Administered 2015-12-27: 25 ug via ORAL
  Filled 2015-12-27: qty 1

## 2015-12-27 MED ORDER — FERROUS SULFATE 325 (65 FE) MG PO TABS
325.0000 mg | ORAL_TABLET | Freq: Three times a day (TID) | ORAL | Status: DC
  Administered 2015-12-27 – 2015-12-29 (×7): 325 mg via ORAL
  Filled 2015-12-27 (×8): qty 1

## 2015-12-27 MED ORDER — SENNOSIDES-DOCUSATE SODIUM 8.6-50 MG PO TABS
1.0000 | ORAL_TABLET | Freq: Every day | ORAL | Status: DC
  Administered 2015-12-27 – 2015-12-28 (×2): 1 via ORAL
  Filled 2015-12-27 (×2): qty 1

## 2015-12-27 NOTE — Progress Notes (Signed)
Pt heart rate dropping into 30's-40's. Pt sleeping, upon awakening him he states he feels ok just "in a deep sleep." Abner Greenspan, MD paged. Will continue to monitor patient closely.

## 2015-12-27 NOTE — Progress Notes (Signed)
VASCULAR LAB PRELIMINARY  PRELIMINARY  PRELIMINARY  PRELIMINARY  Carotid duplex completed.     Bilateral:  1-39% ICA stenosis.  Vertebral artery flow is antegrade.     Armarion Greek, RVT, RDMS 12/27/2015, 11:40 AM

## 2015-12-27 NOTE — Progress Notes (Signed)
PROGRESS NOTE    [name]  [id number] DOB: Nov 24, 1940 DOA: 12/26/2015 PCP: [name], MD    Assessment & Plan:   Principal Problem:   DOE (dyspnea on exertion) Active Problems:   Coronary artery disease   Hypothyroidism   Essential hypertension   Iron deficiency anemia   Chest pain   Dizziness   Vitamin B12 deficiency  #1 shortness of breath on exertion Questionable etiology. Differential disease versus arrhythmia versus untreated obstructive sleep apnea. Cardiac enzymes negative 3. EKG with no ischemic changes. 2-D echo pending. If 2-D echo is normal outpatient follow-up.  #2 dizziness Questionable etiology. Carotid Dopplers with no significant ICA stenosis. Patient not orthostatic. Patient with no active bleeding. Continue gentle hydration. Carotid ultrasound with no significant ICA stenosis. Patient with no focal neurological deficits. Follow.  #3 generalized weakness/fatigue Likely secondary to B-12 deficiency and hypothyroidism. Patient has been started on B-12 injections. Increased Synthroid 100 MCG's daily. Outpatient follow-up.  #4 hypothyroidism TSH of 23.568. Patient states she's been compliant with his medications. Increased Synthroid to 100 MCG's daily. Repeat thyroid function studies in 4-6 weeks.  #5 B-12 deficiency B 12 injections.  #6 hypertension Stable.  #7 iron deficiency anemia Start on oral iron supplementation. Outpatient follow-up.   DVT prophylaxis: SCD Code Status: Full Family Communication: Updated patient at bedside no family present. Disposition Plan: home when workup is done with clinical improvement. Hopefully 1-2 days.  Consultants:  None  Procedures:   Carotid ultrasound 12/27/2015  Chest x-ray 12/26/2015    Antimicrobials:   None   Subjective: Patient with complaints of fatigue, weakness. Patient states no significant improvement in concerned about his weakness.  Objective: Filed Vitals:   12/26/15  2056 12/27/15 0537 12/27/15 1421 12/27/15 2044  BP: 138/79 131/70 123/63 150/75  Pulse: 62 67 66 63  Temp: 97.8 F (36.6 C) 97.6 F (36.4 C) 98 F (36.7 C) 97.8 F (36.6 C)  TempSrc: Oral Oral Oral Oral  Resp: 18 18 17 18   Height: 5\' 10"  (1.778 m)     Weight: 102.9 kg (226 lb 13.7 oz)     SpO2:  92% 97% 96%    Intake/Output Summary (Last 24 hours) at 12/27/15 2231 Last data filed at 12/27/15 2217  Gross per 24 hour  Intake   1585 ml  Output   3225 ml  Net  -1640 ml   Filed Weights   12/26/15 2056  Weight: 102.9 kg (226 lb 13.7 oz)    Examination:  General exam: Appears calm and comfortable  Respiratory system: Clear to auscultation. Respiratory effort normal. Cardiovascular system: S1 & S2 heard, RRR. No JVD, murmurs, rubs, gallops or clicks. No pedal edema. Gastrointestinal system: Abdomen is nondistended, soft and nontender. No organomegaly or masses felt. Normal bowel sounds heard. Central nervous system: Alert and oriented. No focal neurological deficits. Extremities: Symmetric 5 x 5 power. Skin: No rashes, lesions or ulcers Psychiatry: Judgement and insight appear normal. Mood & affect appropriate.     Data Reviewed: I have personally reviewed following labs and imaging studies  CBC:  Recent Labs Lab 12/26/15 1301 12/27/15 0701  WBC 8.0 7.6  HGB 12.2* 11.3*  HCT 41.7 39.1  MCV 76.9* 78.7  PLT 240 123456   Basic Metabolic Panel:  Recent Labs Lab 12/26/15 1301 12/27/15 0701  NA 139 139  K 4.1 4.0  CL 105 106  CO2 26 26  GLUCOSE 102* 117*  BUN 15 13  CREATININE 0.89 1.00  CALCIUM 9.2 8.6*  GFR: Estimated Creatinine Clearance: 77.9 mL/min (by C-G formula based on Cr of 1). Liver Function Tests:  Recent Labs Lab 12/26/15 1301  AST 23  ALT 21  ALKPHOS 51  BILITOT 1.1  PROT 7.5  ALBUMIN 4.6   No results for input(s): LIPASE, AMYLASE in the last 168 hours. No results for input(s): AMMONIA in the last 168 hours. Coagulation  Profile:  Recent Labs Lab 12/26/15 1247  INR 1.07   Cardiac Enzymes:  Recent Labs Lab 12/26/15 2120 12/27/15 0033 12/27/15 0701  TROPONINI <0.03 <0.03 <0.03   BNP (last 3 results) No results for input(s): PROBNP in the last 8760 hours. HbA1C: No results for input(s): HGBA1C in the last 72 hours. CBG: No results for input(s): GLUCAP in the last 168 hours. Lipid Profile: No results for input(s): CHOL, HDL, LDLCALC, TRIG, CHOLHDL, LDLDIRECT in the last 72 hours. Thyroid Function Tests:  Recent Labs  12/26/15 2120  TSH 23.568*  FREET4 0.70   Anemia Panel:  Recent Labs  12/26/15 2120  FERRITIN 17*  TIBC 458*  IRON 51   Sepsis Labs: No results for input(s): PROCALCITON, LATICACIDVEN in the last 168 hours.  No results found for this or any previous visit (from the past 240 hour(s)).       Radiology Studies: Dg Chest 2 View  12/26/2015  CLINICAL DATA:  Mid chest pain and shortness of breath earlier today. Some weakness. Ex-smoker. EXAM: CHEST  2 VIEW COMPARISON:  11/26/2014. FINDINGS: Stable enlarged cardiac silhouette. Interval small amount of linear density at both lung bases. Thoracic spine degenerative changes. IMPRESSION: Small amount of interval bibasilar linear atelectasis or scarring. Electronically Signed   By: Claudie Revering M.D.   On: 12/26/2015 18:03        Scheduled Meds: . aspirin EC  81 mg Oral Daily  . cyanocobalamin  1,000 mcg Intramuscular Q0600  . ezetimibe  10 mg Oral QHS  . ferrous sulfate  325 mg Oral TID WC  . [START ON 12/28/2015] levothyroxine  100 mcg Oral QAC breakfast  . pantoprazole  40 mg Oral Daily  . ropinirole  5 mg Oral QHS  . senna-docusate  1 tablet Oral QHS   Continuous Infusions:       Time spent: 35 minutes    THOMPSON,DANIEL, MD [facility] Pager [phone]  If 7PM-7AM, please contact night-coverage [email or web address] Password [email or web address] 12/27/2015, 10:31 PM

## 2015-12-28 ENCOUNTER — Observation Stay (HOSPITAL_BASED_OUTPATIENT_CLINIC_OR_DEPARTMENT_OTHER): Payer: Medicare Other

## 2015-12-28 DIAGNOSIS — R42 Dizziness and giddiness: Secondary | ICD-10-CM | POA: Diagnosis not present

## 2015-12-28 DIAGNOSIS — R29898 Other symptoms and signs involving the musculoskeletal system: Secondary | ICD-10-CM | POA: Diagnosis not present

## 2015-12-28 DIAGNOSIS — R531 Weakness: Secondary | ICD-10-CM | POA: Diagnosis not present

## 2015-12-28 DIAGNOSIS — E038 Other specified hypothyroidism: Secondary | ICD-10-CM | POA: Diagnosis not present

## 2015-12-28 DIAGNOSIS — R0600 Dyspnea, unspecified: Secondary | ICD-10-CM

## 2015-12-28 DIAGNOSIS — R0609 Other forms of dyspnea: Secondary | ICD-10-CM

## 2015-12-28 LAB — ECHOCARDIOGRAM COMPLETE
CHL CUP MV DEC (S): 338
E/e' ratio: 10.25
EWDT: 338 ms
FS: 28 % (ref 28–44)
Height: 70 in
IVS/LV PW RATIO, ED: 0.93
LA ID, A-P, ES: 36 mm
LA diam index: 1.64 cm/m2
LA vol index: 24.6 mL/m2
LA vol: 54.2 mL
LAVOLA4C: 60.6 mL
LEFT ATRIUM END SYS DIAM: 36 mm
LV PW d: 16.5 mm — AB (ref 0.6–1.1)
LV TDI E'LATERAL: 7.62
LV TDI E'MEDIAL: 6.96
LVEEAVG: 10.25
LVEEMED: 10.25
LVELAT: 7.62 cm/s
MV pk A vel: 91.7 m/s
MV pk E vel: 78.1 m/s
MVPG: 2 mmHg
RV TAPSE: 22.5 mm
Weight: 3629.65 oz

## 2015-12-28 LAB — CBC
HEMATOCRIT: 42.8 % (ref 39.0–52.0)
HEMOGLOBIN: 12.4 g/dL — AB (ref 13.0–17.0)
MCH: 23 pg — ABNORMAL LOW (ref 26.0–34.0)
MCHC: 29 g/dL — ABNORMAL LOW (ref 30.0–36.0)
MCV: 79.3 fL (ref 78.0–100.0)
Platelets: 240 10*3/uL (ref 150–400)
RBC: 5.4 MIL/uL (ref 4.22–5.81)
RDW: 26.5 % — AB (ref 11.5–15.5)
WBC: 9.2 10*3/uL (ref 4.0–10.5)

## 2015-12-28 LAB — BASIC METABOLIC PANEL
ANION GAP: 9 (ref 5–15)
BUN: 14 mg/dL (ref 6–20)
CALCIUM: 8.6 mg/dL — AB (ref 8.9–10.3)
CHLORIDE: 104 mmol/L (ref 101–111)
CO2: 26 mmol/L (ref 22–32)
Creatinine, Ser: 1.07 mg/dL (ref 0.61–1.24)
GFR calc non Af Amer: >60 mL/min (ref >60)
Glucose, Bld: 114 mg/dL — ABNORMAL HIGH (ref 65–99)
POTASSIUM: 3.8 mmol/L (ref 3.5–5.1)
Sodium: 139 mmol/L (ref 135–145)

## 2015-12-28 MED ORDER — POLYVINYL ALCOHOL 1.4 % OP SOLN
1.0000 [drp] | OPHTHALMIC | Status: DC | PRN
  Administered 2015-12-28: 1 [drp] via OPHTHALMIC
  Filled 2015-12-28: qty 15

## 2015-12-28 NOTE — Progress Notes (Signed)
PROGRESS NOTE    [name]  [id number] DOB: [birth year] DOA: 12/26/2015 PCP: [name], MD    Assessment & Plan:   Principal Problem:   DOE (dyspnea on exertion) Active Problems:   Coronary artery disease   Hypothyroidism   Essential hypertension   Iron deficiency anemia   Chest pain   Dizziness   Vitamin B12 deficiency   Absolute anemia  #1 shortness of breath on exertion Unclear etiology. Cardiac enzymes negative 3. EKG with no ischemic changes.  -Chest x-ray performed on 12/26/2015 revealing small amount of minimal bibasilar linear atelectasis/scarring, no evidence of edema or infiltrate -He appears nontoxic, afebrile -Transthoracic echocardiogram performed on 12/28/2015, results pending at the time of this dictation  #2 dizziness Questionable etiology. Carotid Dopplers with no significant ICA stenosis. Patient not orthostatic. Patient with no active bleeding. Continue gentle hydration. Carotid ultrasound with no significant ICA stenosis. Patient with no focal neurological deficits.  -May be related to deconditioning. He was ambulated down the hallway  #3 generalized weakness/fatigue Likely secondary to B-12 deficiency and hypothyroidism. Patient has been started on B-12 injections. Increased Synthroid 100 MCG's daily.   #4 hypothyroidism TSH of 23.568. Patient states she's been compliant with his medications. Increased Synthroid to 100 MCG's daily. Repeat thyroid function studies in 4-6 weeks.  #5 B-12 deficiency B 12 injections.  #6 hypertension Stable.  #7 iron deficiency anemia Start on oral iron supplementation. Outpatient follow-up.   DVT prophylaxis: SCD Code Status: Full Family Communication: Updated patient at bedside no family present. Disposition Plan: home when workup is done with clinical improvement. Hopefully 1-2 days.  Consultants:  None  Procedures:   Carotid ultrasound 12/27/2015  Chest x-ray  12/26/2015    Antimicrobials:   None   Subjective: States feeling little better today, he was ambulated down the hallway reported having some weakness/fatigue  Objective: Filed Vitals:   12/27/15 0537 12/27/15 1421 12/27/15 2044 12/28/15 0518  BP: 131/70 123/63 150/75 118/76  Pulse: 67 66 63 70  Temp: 97.6 F (36.4 C) 98 F (36.7 C) 97.8 F (36.6 C) 98.5 F (36.9 C)  TempSrc: Oral Oral Oral Oral  Resp: 18 17 18 16   Height:      Weight:      SpO2: 92% 97% 96% 92%    Intake/Output Summary (Last 24 hours) at 12/28/15 1417 Last data filed at 12/28/15 0812  Gross per 24 hour  Intake    480 ml  Output   2300 ml  Net  -1820 ml   Filed Weights   12/26/15 2056  Weight: 102.9 kg (226 lb 13.7 oz)    Examination:  General exam: Appears calm and comfortable  Respiratory system: Clear to auscultation. Respiratory effort normal. Cardiovascular system: S1 & S2 heard, RRR. No JVD, murmurs, rubs, gallops or clicks. No pedal edema. Gastrointestinal system: Abdomen is nondistended, soft and nontender. No organomegaly or masses felt. Normal bowel sounds heard. Central nervous system: Alert and oriented. No focal neurological deficits. Extremities: Symmetric 5 x 5 power. Skin: No rashes, lesions or ulcers Psychiatry: Judgement and insight appear normal. Mood & affect appropriate.     Data Reviewed: I have personally reviewed following labs and imaging studies  CBC:  Recent Labs Lab 12/26/15 1301 12/27/15 0701 12/28/15 0457  WBC 8.0 7.6 9.2  HGB 12.2* 11.3* 12.4*  HCT 41.7 39.1 42.8  MCV 76.9* 78.7 79.3  PLT 240 215 [id number]   Basic Metabolic Panel:  Recent Labs Lab 12/26/15 1301 12/27/15 0701 12/28/15 0457  NA 139 139 139  K 4.1 4.0 3.8  CL 105 106 104  CO2 26 26 26   GLUCOSE 102* 117* 114*  BUN 15 13 14   CREATININE 0.89 1.00 1.07  CALCIUM 9.2 8.6* 8.6*   GFR: Estimated Creatinine Clearance: 72.8 mL/min (by C-G formula based on Cr of 1.07). Liver Function  Tests:  Recent Labs Lab 12/26/15 1301  AST 23  ALT 21  ALKPHOS 51  BILITOT 1.1  PROT 7.5  ALBUMIN 4.6   No results for input(s): LIPASE, AMYLASE in the last 168 hours. No results for input(s): AMMONIA in the last 168 hours. Coagulation Profile:  Recent Labs Lab 12/26/15 1247  INR 1.07   Cardiac Enzymes:  Recent Labs Lab 12/26/15 2120 12/27/15 0033 12/27/15 0701  TROPONINI <0.03 <0.03 <0.03   BNP (last 3 results) No results for input(s): PROBNP in the last 8760 hours. HbA1C: No results for input(s): HGBA1C in the last 72 hours. CBG: No results for input(s): GLUCAP in the last 168 hours. Lipid Profile: No results for input(s): CHOL, HDL, LDLCALC, TRIG, CHOLHDL, LDLDIRECT in the last 72 hours. Thyroid Function Tests:  Recent Labs  12/26/15 2120  TSH 23.568*  FREET4 0.70   Anemia Panel:  Recent Labs  12/26/15 2120  FERRITIN 17*  TIBC 458*  IRON 51   Sepsis Labs: No results for input(s): PROCALCITON, LATICACIDVEN in the last 168 hours.  No results found for this or any previous visit (from the past 240 hour(s)).       Radiology Studies: Dg Chest 2 View  12/26/2015  CLINICAL DATA:  Mid chest pain and shortness of breath earlier today. Some weakness. Ex-smoker. EXAM: CHEST  2 VIEW COMPARISON:  11/26/2014. FINDINGS: Stable enlarged cardiac silhouette. Interval small amount of linear density at both lung bases. Thoracic spine degenerative changes. IMPRESSION: Small amount of interval bibasilar linear atelectasis or scarring. Electronically Signed   By: Claudie Revering M.D.   On: 12/26/2015 18:03        Scheduled Meds: . aspirin EC  81 mg Oral Daily  . cyanocobalamin  1,000 mcg Intramuscular Q0600  . ezetimibe  10 mg Oral QHS  . ferrous sulfate  325 mg Oral TID WC  . levothyroxine  100 mcg Oral QAC breakfast  . pantoprazole  40 mg Oral Daily  . ropinirole  5 mg Oral QHS  . senna-docusate  1 tablet Oral QHS   Continuous Infusions:       Time  spent: 25 minutes    Kelvin Cellar, MD [facility] Pager [phone]  If 7PM-7AM, please contact night-coverage [email or web address] Password [email or web address] 12/28/2015, 2:17 PM

## 2015-12-28 NOTE — Care Management Obs Status (Signed)
[facility] NOTIFICATION   Patient Details  Name: [name] MRN: [id number] Date of Birth: 22-Jun-1941   Medicare Observation Status Notification Given:  Yes    Purcell Mouton, RN 12/28/2015, 9:12 AM

## 2015-12-28 NOTE — Progress Notes (Signed)
*  PRELIMINARY RESULTS* Echocardiogram 2D Echocardiogram has been performed.  Leavy Cella 12/28/2015, 11:05 AM

## 2015-12-29 DIAGNOSIS — R531 Weakness: Secondary | ICD-10-CM

## 2015-12-29 DIAGNOSIS — R29898 Other symptoms and signs involving the musculoskeletal system: Secondary | ICD-10-CM | POA: Diagnosis not present

## 2015-12-29 DIAGNOSIS — R0609 Other forms of dyspnea: Secondary | ICD-10-CM | POA: Diagnosis not present

## 2015-12-29 DIAGNOSIS — E038 Other specified hypothyroidism: Secondary | ICD-10-CM | POA: Diagnosis not present

## 2015-12-29 DIAGNOSIS — E538 Deficiency of other specified B group vitamins: Secondary | ICD-10-CM

## 2015-12-29 LAB — BASIC METABOLIC PANEL
Anion gap: 7 (ref 5–15)
BUN: 16 mg/dL (ref 6–20)
CALCIUM: 8.9 mg/dL (ref 8.9–10.3)
CO2: 27 mmol/L (ref 22–32)
CREATININE: 1.08 mg/dL (ref 0.61–1.24)
Chloride: 104 mmol/L (ref 101–111)
Glucose, Bld: 122 mg/dL — ABNORMAL HIGH (ref 65–99)
Potassium: 3.9 mmol/L (ref 3.5–5.1)
SODIUM: 138 mmol/L (ref 135–145)

## 2015-12-29 LAB — CBC
HEMATOCRIT: 41 % (ref 39.0–52.0)
HEMOGLOBIN: 12.4 g/dL — AB (ref 13.0–17.0)
MCH: 23.4 pg — ABNORMAL LOW (ref 26.0–34.0)
MCHC: 30.2 g/dL (ref 30.0–36.0)
MCV: 77.4 fL — AB (ref 78.0–100.0)
Platelets: 238 10*3/uL (ref 150–400)
RBC: 5.3 MIL/uL (ref 4.22–5.81)
RDW: 26.5 % — ABNORMAL HIGH (ref 11.5–15.5)
WBC: 10.7 10*3/uL — ABNORMAL HIGH (ref 4.0–10.5)

## 2015-12-29 MED ORDER — FERROUS SULFATE 325 (65 FE) MG PO TABS: 325.0000 mg | ORAL_TABLET | Freq: Three times a day (TID) | ORAL | Status: DC

## 2015-12-29 MED ORDER — LEVOTHYROXINE SODIUM 100 MCG PO TABS: 100.0000 ug | ORAL_TABLET | Freq: Every day | ORAL | Status: DC

## 2015-12-29 NOTE — Progress Notes (Signed)
Electrophysiology Office Note   Date:  12/30/2015   ID:  JUSTINMICHAEL [name], DOB 06/07/1941, MRN [id number]  PCP:  [name], MD  Cardiologist:  [name] Primary Electrophysiologist:  Glover Capano Meredith Leeds, MD    Chief Complaint  Patient presents with  . Advice Only     History of Present Illness: [name] is a 75 y.o. male who presents today for electrophysiology evaluation.   He has a history of CAD (s/p PCI 10/2014 with DES to [place], patent by relook 11/2014), HTN, HLD (statin intolerant), OSA, bradycardia (7 sec pause nocturnally in 2011), GIB/AVM s/p coagulation/ablation, remote gastritis, prior tobacco abuse, chronic diastolic CHF with LE previously treated with Lasix (neg LE venous duplex in 2016). He was recently admitted to the hospital for shortness of breath. Cardiac enzymes were checked 3 which were negative and he had no EKG changes. Chest x-ray showed moderate minimal bibasilar atelectasis and scarring but no edema. He had an echocardiogram showed an EF 65-70% and grade 1 diastolic dysfunction. He was found to have generalized fatigue and weakness and was started on B12 injections and his Synthroid was increased. His TSH was elevated at the time of admission. Since his discharge yesterday, he is felt much improved.  He wore a cardiac monitor in May, which showed evidence of nocturnal bradycardia with rates as low as 22. He had blocked APCs at the time. He does say that he was tested for sleep apnea a few years ago, but did not qualify because he did not sleep long enough at night. He says that he has been having episodes where he is walking and at times has had severe dizziness out without any known causes. He says that he has not actually lost consciousness.   Today, he denies symptoms of palpitations, chest pain, shortness of breath, orthopnea, PND, lower extremity edema, claudication, dizziness, presyncope, syncope, bleeding, or neurologic sequela. The patient is  tolerating medications without difficulties and is otherwise without complaint today.    Past Medical History  Diagnosis Date  . Anemia   . Hyperlipidemia   . Hypertension   . Allergy   . Myocardial infarction ([place])   . GERD (gastroesophageal reflux disease)   . Restless leg syndrome   . Hypothyroidism   . Headache(784.0)   . CAD (coronary artery disease)     a. Nonobst disease by cath 2009. b. s/p PCI 10/2014 with DES to [place], patent by relook 11/2014 (Brilinta changed to Plavix with improved sx).  . Gastritis 2011  . Bronchitis   . Back pain     lower back pain."inflammed disc" pain meds for this  . Diverticulosis   . Adenomatous polyp of colon 2007  . AVM (arteriovenous malformation) 2011    a. S/p argon plasma coagulation and ablation in 2011, 2017.  . Statin intolerance   . Bradycardia     a. H/o almost 7sec pause nocturnally during 2011 admission. Also has h/o fatigue with BB.  . Depression   . Transfusion history     several years ago -GI bleed  . Obstructive sleep apnea     -no cpap use  . Chronic diastolic CHF (congestive heart failure) ([place])   . PVC's (premature ventricular contractions) Holter 2016  . Premature atrial contractions Holter 2016   Past Surgical History  Procedure Laterality Date  . Appendectomy    . Hernia repair    . Tonsillectomy    . Ankle surgery Left   .  Colonoscopy  08-23-05    per Dr. Deatra Ina, adenomatous polyps, repeat in 5 yrs   . Esophagogastroduodenoscopy  08-23-05    per Dr. Deatra Ina, cauterized jejunal AVMs   . Skin graft Right     leg  . Left heart catheterization with coronary angiogram N/A 09/08/2012    Procedure: LEFT HEART CATHETERIZATION WITH CORONARY ANGIOGRAM;  Surgeon: Burnell Blanks, MD;  Location: [facility] CATH LAB;  Service: Cardiovascular;  Laterality: N/A;  . Left heart catheterization with coronary angiogram N/A 11/16/2014    Procedure: LEFT HEART CATHETERIZATION WITH CORONARY ANGIOGRAM;  Surgeon: Leonie Man,  MD;  Location: [facility] CATH LAB;  Service: Cardiovascular;  Laterality: N/A;  . Cardiac catheterization N/A 11/26/2014    Procedure: Left Heart Cath and Coronary Angiography;  Surgeon: Burnell Blanks, MD;  Location: [facility] CV LAB;  Service: Cardiovascular;  Laterality: N/A;  . Enteroscopy N/A 12/06/2015    Procedure: ENTEROSCOPY;  Surgeon: Doran Stabler, MD;  Location: [facility] ENDOSCOPY;  Service: Gastroenterology;  Laterality: N/A;  . Hot hemostasis N/A 12/06/2015    Procedure: HOT HEMOSTASIS (ARGON PLASMA COAGULATION/BICAP);  Surgeon: Doran Stabler, MD;  Location: [facility] ENDOSCOPY;  Service: Gastroenterology;  Laterality: N/A;  . Colonoscopy with propofol N/A 12/06/2015    Procedure: COLONOSCOPY WITH PROPOFOL;  Surgeon: Doran Stabler, MD;  Location: [facility] ENDOSCOPY;  Service: Gastroenterology;  Laterality: N/A;     Current Outpatient Prescriptions  Medication Sig Dispense Refill  . Ascorbic Acid (VITAMIN C PO) Take 1 tablet by mouth daily as needed (flu like symptopms).    [name] aspirin EC 81 MG tablet Take 81 mg by mouth daily.    . clonazePAM (KLONOPIN) 0.5 MG tablet Take 0.5 mg by mouth 3 (three) times daily as needed for anxiety.    . clopidogrel (PLAVIX) 75 MG tablet Take 75 mg by mouth daily.     . diclofenac sodium (VOLTAREN) 1 % GEL Apply 1 application topically daily as needed (ankle pain). 5 Tube 10  . ezetimibe (ZETIA) 10 MG tablet Take 1 tablet (10 mg total) by mouth daily. 30 tablet 11  . ferrous sulfate 325 (65 FE) MG tablet Take 1 tablet (325 mg total) by mouth 3 (three) times daily with meals. 90 tablet 3  . fluticasone (FLONASE) 50 MCG/ACT nasal spray Place 1 spray into both nostrils daily.    . furosemide (LASIX) 40 MG tablet Take 40 mg by mouth daily as needed for fluid or edema.    [name] levothyroxine (SYNTHROID, LEVOTHROID) 100 MCG tablet Take 1 tablet (100 mcg total) by mouth daily before breakfast. 30 tablet 1  . methocarbamol (ROBAXIN) 750 MG tablet Take 750 mg by mouth 4  (four) times daily as needed for muscle spasms.     [name] morphine (MSIR) 30 MG tablet Take 1 tablet (30 mg total) by mouth every 6 (six) hours as needed for severe pain. 120 tablet 0  . nitroGLYCERIN (NITROSTAT) 0.4 MG SL tablet PLACE 1 TABLET UNDER TONGUE EVERY 5 MINUTES AS NEEDED FOR CHEST PAIN 75 tablet 3  . omeprazole (PRILOSEC) 20 MG capsule Take 1 capsule (20 mg total) by mouth 2 (two) times daily. (Patient taking differently: Take 20 mg by mouth 2 (two) times daily as needed (reflux). ) 60 capsule 11  . oxyCODONE-acetaminophen (PERCOCET) 10-325 MG tablet Take 1 tablet by mouth daily as needed (moderate pain). 120 tablet 0  . ropinirole (REQUIP) 5 MG tablet TAKE 1 TABLET (5 MG TOTAL) BY MOUTH AT  BEDTIME. 90 tablet 3  . Tetrahydrozoline HCl (MURINE FOR RED EYES OP) Place 1 drop into both eyes daily as needed (red eyes).     No current facility-administered medications for this visit.    Allergies:   Brilinta; Metoprolol tartrate; Shellfish allergy; Statins; Zolpidem; Levaquin; and Trazodone and nefazodone   Social History:  The patient  reports that he quit smoking about 13 years ago. His smoking use included Cigarettes. He has a 100 pack-year smoking history. He has never used smokeless tobacco. He reports that he does not drink alcohol or use illicit drugs.   Family History:  The patient's family history includes Alcoholism in his maternal grandfather and paternal uncle; Heart attack in his father; Heart disease in his father; Leukemia in his mother; Stroke in his father.    ROS:  Please see the history of present illness.   Otherwise, review of systems is positive for leg pain, SOB with lying down, waking up at night SOB, hearing loss, DOE, depression.   All other systems are reviewed and negative.    PHYSICAL EXAM: VS:  BP 146/84 mmHg  Pulse 76  Ht 5\' 10"  (1.778 m)  Wt 230 lb 6.4 oz (104.509 kg)  BMI 33.06 kg/m2  SpO2 95% , BMI Body mass index is 33.06 kg/(m^2). GEN: Well nourished,  well developed, in no acute distress HEENT: normal Neck: no JVD, carotid bruits, or masses Cardiac: RRR; no murmurs, rubs, or gallops,no edema  Respiratory:  clear to auscultation bilaterally, normal work of breathing GI: soft, nontender, nondistended, + BS MS: no deformity or atrophy Skin: warm and dry,  Neuro:  Strength and sensation are intact Psych: euthymic mood, full affect  EKG:  EKG is not ordered today.   Recent Labs: 12/26/2015: ALT 21; TSH 23.568* 12/29/2015: BUN 16; Creatinine, Ser 1.08; Hemoglobin 12.4*; Platelets 238; Potassium 3.9; Sodium 138    Lipid Panel     Component Value Date/Time   CHOL 138 05/16/2015 0732   TRIG 154* 05/16/2015 0732   HDL 32* 05/16/2015 0732   CHOLHDL 4.3 05/16/2015 0732   VLDL 31* 05/16/2015 0732   LDLCALC 75 05/16/2015 0732     Wt Readings from Last 3 Encounters:  12/30/15 230 lb 6.4 oz (104.509 kg)  12/26/15 226 lb 13.7 oz (102.9 kg)  12/16/15 237 lb 6.4 oz (107.684 kg)      Other studies Reviewed: Additional studies/ records that were reviewed today include: TTE 12/28/15  - Left ventricle: The cavity size was normal. There was moderate  concentric hypertrophy. Systolic function was vigorous. The  estimated ejection fraction was in the range of 65% to 70%. Wall  motion was normal; there were no regional wall motion  abnormalities. There was an increased relative contribution of  atrial contraction to ventricular filling. Doppler parameters are  consistent with abnormal left ventricular relaxation (grade 1  diastolic dysfunction). - Aortic valve: Moderate diffuse thickening and calcification   Holter 12/08/15 Sinus rhythm with sinus bradycardia.  There are pauses over 5 seconds with blocked p-waves.  Frequent premature ventricular contractions with couplets and triplets.  Frequent premature atrial contractions  ASSESSMENT AND PLAN:  1.  Sinus bradycardia: Found on his Holter monitor. Appears to be nocturnal in  origin. He does say that he was potentially diagnosed with sleep apnea a few years ago, but is not on CPAP therapy. Due to that, we'll order a repeat sleep study to see if he does qualify for CPAP. If he does not cough for CPAP,  we Amaurie Schreckengost potentially plan for pacemaker placement.  2. Hearing loss: Referral to audiology    Current medicines are reviewed at length with the patient today.   The patient does not have concerns regarding his medicines.  The following changes were made today:  none  Labs/ tests ordered today include:  No orders of the defined types were placed in this encounter.     Disposition:   FU with Nitesh Pitstick post sleep study  Signed, Tylin Stradley Meredith Leeds, MD  12/30/2015 11:58 AM     [facility] [address] [place] [address] [phone] (office) [phone] (fax)

## 2015-12-29 NOTE — Discharge Summary (Signed)
Physician Discharge Summary  [name] [id number] DOB: Oct 06, 1940 DOA: 12/26/2015  PCP: [name], MD  Admit date: 12/26/2015 Discharge date: 12/29/2015  Time spent: 35 minutes  Recommendations for Outpatient Follow-up:  1. Please follow up on a TSH level in 4 weeks, his Synthroid was increased to 100 mcg PO q daily, having elevated TSH of 23.5  Discharge Diagnoses:  Principal Problem:   DOE (dyspnea on exertion) Active Problems:   Coronary artery disease   Hypothyroidism   Essential hypertension   Iron deficiency anemia   Chest pain   Dizziness   Vitamin B12 deficiency   Absolute anemia   Discharge Condition: Stable/improved  Diet recommendation: Heart healthy  Filed Weights   12/26/15 2056  Weight: 102.9 kg (226 lb 13.7 oz)    History of present illness:  [name] is a 75 y.o. gentleman with a history of CAD S/P 2 prior stents (currently on ASA alone, plavix held recently due to GI bleed), chronic GI bleeding secondary to AVMs, HTN, HLD (intolerant to statins), diastolic heart failure, and probable OSA (formerly on CPAP but has not had a qualifying sleep study for insurance to cover) who has had several weeks of DOE, some shortness of breath at rest, dizziness, and increased fatigue/generalized weakness. He has not had palpitations. No chest pain prior to arrival today. No syncope. No cough. No nausea or vomiting. No progressive lower extremity edema or increased abdominal girth. No weight gain. He was admitted in late May for symptomatic anemia, and felt markedly improved (for approximately one week) after blood transfusion. Symptoms have now recurred, and he was encouraged to present to the ED by his PCP.  Hospital Course:   #1 shortness of breath on exertion Unclear etiology. Cardiac enzymes negative 3. EKG with no ischemic changes.  -Chest x-ray performed on 12/26/2015 revealing small amount of minimal bibasilar linear atelectasis/scarring,  no evidence of edema or infiltrate -He appears nontoxic, afebrile -Transthoracic echocardiogram performed on 12/28/2015 revealed an ejection fraction of 65-70% with grade 1 diastolic dysfunction -By [id number] patient market clinical improvement was emanating down the hallway without the need for supplemental oxygen. During this time he remained afebrile, hemodynamically stable, with lab work not revealing obvious source of infection.   #2 dizziness Questionable etiology. Carotid Dopplers with no significant ICA stenosis. Patient not orthostatic. Patient with no active bleeding. Continue gentle hydration. Carotid ultrasound with no significant ICA stenosis. Patient with no focal neurological deficits.  -May be related to deconditioning.   #3 generalized weakness/fatigue Likely secondary to B-12 deficiency and hypothyroidism. Patient has been started on B-12 injections. Increased Synthroid 100 MCG's daily.   #4 hypothyroidism Labs revealed a TSH of 23.568. Patient states she's been compliant with his medications. Increased Synthroid to 100 MCG's daily. Repeat thyroid function studies in 4-6 weeks.  #5 B-12 deficiency He was given B12 injections during this hospitalization  #6 iron deficiency anemia Was discharged on iron therapy  Discharge Exam: Filed Vitals:   12/29/15 0431 12/29/15 1347  BP: 125/64 146/81  Pulse: 76 74  Temp: 98 F (36.7 C) 97.8 F (36.6 C)  Resp: 18 18    General exam: Appears calm and comfortable  Respiratory system: Clear to auscultation. Respiratory effort normal. Cardiovascular system: S1 & S2 heard, RRR. No JVD, murmurs, rubs, gallops or clicks. No pedal edema. Gastrointestinal system: Abdomen is nondistended, soft and nontender. No organomegaly or masses felt. Normal bowel sounds heard. Central nervous system: Alert and oriented. No focal neurological deficits. Extremities:  Symmetric 5 x 5 power. Skin: No rashes, lesions or ulcers Psychiatry:  Judgement and insight appear normal. Mood & affect appropriate.   Discharge Instructions   Discharge Instructions    Call MD for:  difficulty breathing, headache or visual disturbances    Complete by:  As directed      Call MD for:  extreme fatigue    Complete by:  As directed      Call MD for:  hives    Complete by:  As directed      Call MD for:  persistant dizziness or light-headedness    Complete by:  As directed      Call MD for:  persistant nausea and vomiting    Complete by:  As directed      Call MD for:  redness, tenderness, or signs of infection (pain, swelling, redness, odor or green/yellow discharge around incision site)    Complete by:  As directed      Call MD for:  severe uncontrolled pain    Complete by:  As directed      Call MD for:  temperature >100.4    Complete by:  As directed      Call MD for:    Complete by:  As directed      Diet - low sodium heart healthy    Complete by:  As directed      Increase activity slowly    Complete by:  As directed           Current Discharge Medication List    START taking these medications   Details  ferrous sulfate 325 (65 FE) MG tablet Take 1 tablet (325 mg total) by mouth 3 (three) times daily with meals. Qty: 90 tablet, Refills: 3      CONTINUE these medications which have CHANGED   Details  levothyroxine (SYNTHROID, LEVOTHROID) 100 MCG tablet Take 1 tablet (100 mcg total) by mouth daily before breakfast. Qty: 30 tablet, Refills: 1      CONTINUE these medications which have NOT CHANGED   Details  Ascorbic Acid (VITAMIN C PO) Take 1 tablet by mouth daily as needed (flu like symptopms).    aspirin EC 81 MG tablet Take 81 mg by mouth daily.    clonazePAM (KLONOPIN) 0.5 MG tablet Take 0.5 mg by mouth 3 (three) times daily as needed for anxiety.    clopidogrel (PLAVIX) 75 MG tablet Take 75 mg by mouth daily.     diclofenac sodium (VOLTAREN) 1 % GEL Apply 1 application topically daily as needed (ankle  pain). Qty: 5 Tube, Refills: 10    ezetimibe (ZETIA) 10 MG tablet Take 1 tablet (10 mg total) by mouth daily. Qty: 30 tablet, Refills: 11    fluticasone (FLONASE) 50 MCG/ACT nasal spray Place 1 spray into both nostrils daily.    furosemide (LASIX) 40 MG tablet Take 40 mg by mouth daily as needed for fluid or edema.    methocarbamol (ROBAXIN) 750 MG tablet Take 750 mg by mouth 4 (four) times daily as needed for muscle spasms.     morphine (MSIR) 30 MG tablet Take 1 tablet (30 mg total) by mouth every 6 (six) hours as needed for severe pain. Qty: 120 tablet, Refills: 0    nitroGLYCERIN (NITROSTAT) 0.4 MG SL tablet PLACE 1 TABLET UNDER TONGUE EVERY 5 MINUTES AS NEEDED FOR CHEST PAIN Qty: 75 tablet, Refills: 3    omeprazole (PRILOSEC) 20 MG capsule Take 1 capsule (20 mg total) by  mouth 2 (two) times daily. Qty: 60 capsule, Refills: 11    oxyCODONE-acetaminophen (PERCOCET) 10-325 MG tablet Take 1 tablet by mouth daily as needed (moderate pain). Qty: 120 tablet, Refills: 0    ropinirole (REQUIP) 5 MG tablet TAKE 1 TABLET (5 MG TOTAL) BY MOUTH AT BEDTIME. Qty: 90 tablet, Refills: 3    Tetrahydrozoline HCl (MURINE FOR RED EYES OP) Place 1 drop into both eyes daily as needed (red eyes).       Allergies  Allergen Reactions  . Brilinta [Ticagrelor] Shortness Of Breath  . Metoprolol Tartrate Other (See Comments)    Sever chest pains " flat lined patient"  . Shellfish Allergy Anaphylaxis and Hives  . Statins Other (See Comments)    All statins cause myalgias   . Zolpidem Other (See Comments)    Chest pain  . Levaquin [Levofloxacin] Hives  . Trazodone And Nefazodone Other (See Comments)    Unsteady on feet   Follow-up Information    Follow up with FRY,STEPHEN A, MD In 1 week.   Specialty:  Family Medicine   Contact information:   [place] [address] [phone]        The results of significant diagnostics from this hospitalization (including  imaging, microbiology, ancillary and laboratory) are listed below for reference.    Significant Diagnostic Studies: Dg Chest 2 View  12/26/2015  CLINICAL DATA:  Mid chest pain and shortness of breath earlier today. Some weakness. Ex-smoker. EXAM: CHEST  2 VIEW COMPARISON:  11/26/2014. FINDINGS: Stable enlarged cardiac silhouette. Interval small amount of linear density at both lung bases. Thoracic spine degenerative changes. IMPRESSION: Small amount of interval bibasilar linear atelectasis or scarring. Electronically Signed   By: Claudie Revering M.D.   On: 12/26/2015 18:03    Microbiology: No results found for this or any previous visit (from the past 240 hour(s)).   Labs: Basic Metabolic Panel:  Recent Labs Lab 12/26/15 1301 12/27/15 0701 12/28/15 0457 12/29/15 0311  NA 139 139 139 138  K 4.1 4.0 3.8 3.9  CL 105 106 104 104  CO2 26 26 26 27   GLUCOSE 102* 117* 114* 122*  BUN 15 13 14 16   CREATININE 0.89 1.00 1.07 1.08  CALCIUM 9.2 8.6* 8.6* 8.9   Liver Function Tests:  Recent Labs Lab 12/26/15 1301  AST 23  ALT 21  ALKPHOS 51  BILITOT 1.1  PROT 7.5  ALBUMIN 4.6   No results for input(s): LIPASE, AMYLASE in the last 168 hours. No results for input(s): AMMONIA in the last 168 hours. CBC:  Recent Labs Lab 12/26/15 1301 12/27/15 0701 12/28/15 0457 12/29/15 0311  WBC 8.0 7.6 9.2 10.7*  HGB 12.2* 11.3* 12.4* 12.4*  HCT 41.7 39.1 42.8 41.0  MCV 76.9* 78.7 79.3 77.4*  PLT 240 215 240 238   Cardiac Enzymes:  Recent Labs Lab 12/26/15 2120 12/27/15 0033 12/27/15 0701  TROPONINI <0.03 <0.03 <0.03   BNP: BNP (last 3 results) No results for input(s): BNP in the last 8760 hours.  ProBNP (last 3 results) No results for input(s): PROBNP in the last 8760 hours.  CBG: No results for input(s): GLUCAP in the last 168 hours.     Signed:  Kelvin Cellar MD.  [facility] 12/29/2015, 2:20 PM

## 2015-12-30 ENCOUNTER — Ambulatory Visit (INDEPENDENT_AMBULATORY_CARE_PROVIDER_SITE_OTHER): Payer: Medicare Other | Admitting: Cardiology

## 2015-12-30 VITALS — BP 146/84 | HR 76 | Ht 70.0 in | Wt 230.4 lb

## 2015-12-30 DIAGNOSIS — R400 Somnolence: Secondary | ICD-10-CM

## 2015-12-30 DIAGNOSIS — H9193 Unspecified hearing loss, bilateral: Secondary | ICD-10-CM

## 2015-12-30 DIAGNOSIS — R001 Bradycardia, unspecified: Secondary | ICD-10-CM | POA: Diagnosis not present

## 2015-12-30 DIAGNOSIS — R0683 Snoring: Secondary | ICD-10-CM

## 2015-12-30 DIAGNOSIS — G4710 Hypersomnia, unspecified: Secondary | ICD-10-CM | POA: Diagnosis not present

## 2015-12-30 NOTE — Progress Notes (Signed)
Audiology referral:  12/30/15 APPOINT.[email or web address]---03/19/16,PATIENT IS AWARE.  Received: Today    Doralee Albino, RN

## 2015-12-30 NOTE — Patient Instructions (Signed)
Medication Instructions:  Your physician recommends that you continue on your current medications as directed. Please refer to the Current Medication list given to you today.  Labwork: None ordered  Testing/Procedures: Your physician has recommended that you have a sleep study. This test records several body functions during sleep, including: brain activity, eye movement, oxygen and carbon dioxide blood levels, heart rate and rhythm, breathing rate and rhythm, the flow of air through your mouth and nose, snoring, body muscle movements, and chest and belly movement.  Follow-Up: You have been referred to Audiologist for your hearing loss.  Your physician recommends that you schedule a follow-up appointment with Dr. Curt Bears after your sleep study is completed.  If you need a refill on your cardiac medications before your next appointment, please call your pharmacy.  Thank you for choosing [facility]!!   Trinidad Curet, RN [phone]

## 2016-01-02 LAB — VAS US CAROTID
LCCADDIAS: -14 cm/s
LEFT ECA DIAS: -1 cm/s
LEFT VERTEBRAL DIAS: -7 cm/s
LICADSYS: -47 cm/s
Left CCA dist sys: -64 cm/s
Left CCA prox dias: 15 cm/s
Left CCA prox sys: 72 cm/s
Left ICA dist dias: -17 cm/s
Left ICA prox dias: -20 cm/s
Left ICA prox sys: -59 cm/s
RCCADSYS: -54 cm/s
RIGHT ECA DIAS: -4 cm/s
RIGHT VERTEBRAL DIAS: -10 cm/s
Right CCA prox dias: 11 cm/s
Right CCA prox sys: 80 cm/s

## 2016-01-03 ENCOUNTER — Telehealth: Payer: Self-pay | Admitting: Family Medicine

## 2016-01-03 NOTE — Telephone Encounter (Signed)
NO we cannot refill the oxycodone because he is using morphine instead. He cannot use both

## 2016-01-03 NOTE — Telephone Encounter (Signed)
Pt needs new rx oxycodone °

## 2016-01-04 MED ORDER — OXYCODONE-ACETAMINOPHEN 10-325 MG PO TABS: 1.0000 | ORAL_TABLET | Freq: Every day | ORAL | Status: DC | PRN

## 2016-01-04 NOTE — Telephone Encounter (Signed)
I understand. The rx is ready

## 2016-01-04 NOTE — Telephone Encounter (Signed)
Patient states that he does not use the oxycodone and morphine at the same time. He states he takes the morphine during the day to "knock the edge off" - but it doesn't help with the pain - and he takes the oxycodone at night to help with the pain, and to help him sleep.

## 2016-01-04 NOTE — Telephone Encounter (Signed)
Script is ready for pick up and I spoke with pt.  

## 2016-01-12 ENCOUNTER — Telehealth: Payer: Self-pay | Admitting: Family Medicine

## 2016-01-12 NOTE — Telephone Encounter (Signed)
Patient Name: [name]  DOB: July 15, 1941    Initial Comment is to take 3 iron tablets a day, in the hospital he was given all 3 at one time before he ate, wants to know if he can still do.    Nurse Assessment  Nurse: Raphael Gibney, RN, Vanita Ingles Date/Time (Eastern Time): 01/12/2016 1:54:51 PM  Confirm and document reason for call. If symptomatic, describe symptoms. You must click the next button to save text entered. ---Caller states he was hospitalized recently and received blood. Went back to the hospital about a week ago. he was given 3 iron tabs daily in the am in the hospital and wants to know how he should take them at home. Prescription says to take 1 iron tab 325 mg TID with meals.  Has the patient traveled out of the country within the last 30 days? ---Not Applicable  Does the patient have any new or worsening symptoms? ---No  Please document clinical information provided and list any resource used. ---Advised caller per prescription to take iron tab 325 mg TID with each meal. Verbalized understanding.     Guidelines    Guideline Title Affirmed Question Affirmed Notes       Final Disposition User   Clinical Call [place], RN, Vanita Ingles

## 2016-01-12 NOTE — Telephone Encounter (Signed)
Dr. Sarajane Jews, please see message and advise if any further instructions.

## 2016-01-13 NOTE — Telephone Encounter (Signed)
Yes he can take all 3 together, but he should eat a meal with it

## 2016-01-13 NOTE — Telephone Encounter (Signed)
I spoke with pt and went over below advice. 

## 2016-01-25 DIAGNOSIS — H5712 Ocular pain, left eye: Secondary | ICD-10-CM | POA: Diagnosis not present

## 2016-01-30 ENCOUNTER — Encounter: Payer: Self-pay | Admitting: Gastroenterology

## 2016-01-30 ENCOUNTER — Other Ambulatory Visit (INDEPENDENT_AMBULATORY_CARE_PROVIDER_SITE_OTHER): Payer: Medicare Other

## 2016-01-30 ENCOUNTER — Ambulatory Visit (INDEPENDENT_AMBULATORY_CARE_PROVIDER_SITE_OTHER): Payer: Medicare Other | Admitting: Gastroenterology

## 2016-01-30 VITALS — BP 130/70 | HR 76 | Ht 70.0 in | Wt 230.2 lb

## 2016-01-30 DIAGNOSIS — D509 Iron deficiency anemia, unspecified: Secondary | ICD-10-CM

## 2016-01-30 DIAGNOSIS — K558 Other vascular disorders of intestine: Secondary | ICD-10-CM | POA: Diagnosis not present

## 2016-01-30 DIAGNOSIS — K5520 Angiodysplasia of colon without hemorrhage: Secondary | ICD-10-CM

## 2016-01-30 DIAGNOSIS — K5900 Constipation, unspecified: Secondary | ICD-10-CM | POA: Insufficient documentation

## 2016-01-30 DIAGNOSIS — K5909 Other constipation: Secondary | ICD-10-CM | POA: Diagnosis not present

## 2016-01-30 LAB — CBC WITH DIFFERENTIAL/PLATELET
BASOS PCT: 0.5 % (ref 0.0–3.0)
Basophils Absolute: 0.1 10*3/uL (ref 0.0–0.1)
EOS PCT: 4.8 % (ref 0.0–5.0)
Eosinophils Absolute: 0.6 10*3/uL (ref 0.0–0.7)
HEMATOCRIT: 43.7 % (ref 39.0–52.0)
HEMOGLOBIN: 14.4 g/dL (ref 13.0–17.0)
LYMPHS PCT: 19.4 % (ref 12.0–46.0)
Lymphs Abs: 2.3 10*3/uL (ref 0.7–4.0)
MCHC: 33.1 g/dL (ref 30.0–36.0)
MCV: 84.7 fl (ref 78.0–100.0)
MONO ABS: 1.1 10*3/uL — AB (ref 0.1–1.0)
Monocytes Relative: 9.3 % (ref 3.0–12.0)
Neutro Abs: 7.9 10*3/uL — ABNORMAL HIGH (ref 1.4–7.7)
Neutrophils Relative %: 66 % (ref 43.0–77.0)
Platelets: 318 10*3/uL (ref 150.0–400.0)
RBC: 5.15 Mil/uL (ref 4.22–5.81)
RDW: 30.1 % — AB (ref 11.5–15.5)
WBC: 12 10*3/uL — AB (ref 4.0–10.5)

## 2016-01-30 MED ORDER — POLYETHYLENE GLYCOL 3350 17 GM/SCOOP PO POWD: 1.0000 | Freq: Every day | ORAL | Status: DC

## 2016-01-30 NOTE — Progress Notes (Signed)
     01/30/2016 [name] [id number] Jul 23, 1941   History of Present Illness:  This is a 75 year old male who was hospitalized in May with iron deficiency anemia. At that time he underwent colonoscopy and small bowel enteroscopy by Dr. Loletha Carrow. Colonoscopy revealed diverticulosis and several polyps that were removed and were tubular adenomas on pathology. Repeat colonoscopy is recommended in 3 years. Small bowel enteroscopy revealed 2 nonbleeding AVMs, one in the duodenum and one in the jejunum.  He is on ferrous sulfate 325 mg 3 times a day. His main complaint today is that he is very constipated from a iron supplements. Otherwise he feels "100% better". His last CBC was from one month ago with hemoglobin of 12.4 g at that time.  Current Medications, Allergies, Past Medical History, Past Surgical History, Family History and Social History were reviewed in [employer] record.   Physical Exam: BP 130/70 mmHg  Pulse 76  Ht 5\' 10"  (1.778 m)  Wt 230 lb 3.2 oz (104.418 kg)  BMI 33.03 kg/m2 General: Well developed white male in no acute distress Head: Normocephalic and atraumatic Eyes:  Sclerae anicteric, conjunctiva pink  Ears: Normal auditory acuity Lungs: Clear throughout to auscultation Heart: Regular rate and rhythm Abdomen: Soft, non-distended.  Normal bowel sounds.  Non-tender. Musculoskeletal: Symmetrical with no gross deformities  Extremities: No edema  Neurological: Alert oriented x 4, grossly non-focal Psychological:  Alert and cooperative. Normal mood and affect  Assessment and Recommendations: -IDA:  S/p colonoscopy and enteroscopy 11/2015 with several polyps removed and non-bleeding small bowel AMV's.  On ferrous sulfate TID.  Patient feels much better.  Continue iron supplements per PCP.  Will recheck CBC today. -Constipation:  Secondary to iron supplements.  Will begin Miralax once or twice daily.

## 2016-01-30 NOTE — Patient Instructions (Signed)
Start Miralax once or twice daily.

## 2016-01-30 NOTE — Progress Notes (Signed)
Thank you for sending this case to me. I have reviewed the entire note, and the outlined plan seems appropriate.   Thank you for checking his Hgb today.  Pending those results and recommendations about his iron, I think his PCP can monitor his blood counts and dose his iron, sending him back to [place] as needed.

## 2016-02-23 ENCOUNTER — Telehealth: Payer: Self-pay | Admitting: Family Medicine

## 2016-02-23 DIAGNOSIS — H9190 Unspecified hearing loss, unspecified ear: Secondary | ICD-10-CM

## 2016-02-23 NOTE — Telephone Encounter (Signed)
° °  Pt req a referral to see a AUDIOLOGY

## 2016-02-24 NOTE — Telephone Encounter (Signed)
I spoke with pt  

## 2016-02-24 NOTE — Telephone Encounter (Signed)
The referral was done  

## 2016-03-05 ENCOUNTER — Other Ambulatory Visit: Payer: Self-pay | Admitting: Family Medicine

## 2016-03-06 ENCOUNTER — Encounter (HOSPITAL_BASED_OUTPATIENT_CLINIC_OR_DEPARTMENT_OTHER): Payer: Medicare Other

## 2016-03-08 ENCOUNTER — Encounter: Payer: Self-pay | Admitting: Cardiovascular Disease

## 2016-03-08 NOTE — Progress Notes (Signed)
PT evaluation addendum Late entry for 12/14/25 PT diagnosis    12/15/15 1205  PT Assessment  PT Therapy Diagnosis  Difficulty walking;Abnormality of gait;Generalized weakness (unsteadiness on feet)  PT Recommendation/Assessment Patient needs continued PT services  PT Problem List Decreased strength;Decreased activity tolerance;Decreased balance;Decreased mobility;Pain;Decreased knowledge of use of DME   03/08/2016 [place], [place]

## 2016-03-09 ENCOUNTER — Ambulatory Visit (INDEPENDENT_AMBULATORY_CARE_PROVIDER_SITE_OTHER)
Admission: RE | Admit: 2016-03-09 | Discharge: 2016-03-09 | Disposition: A | Discharge status: Home / Self Care | Payer: Medicare Other | Source: Ambulatory Visit | Attending: Family Medicine | Admitting: Family Medicine

## 2016-03-09 ENCOUNTER — Other Ambulatory Visit: Payer: Self-pay

## 2016-03-09 ENCOUNTER — Ambulatory Visit (INDEPENDENT_AMBULATORY_CARE_PROVIDER_SITE_OTHER): Payer: Medicare Other | Admitting: Family Medicine

## 2016-03-09 ENCOUNTER — Telehealth: Payer: Self-pay | Admitting: Family Medicine

## 2016-03-09 ENCOUNTER — Encounter: Payer: Self-pay | Admitting: Family Medicine

## 2016-03-09 VITALS — BP 152/80 | HR 75 | Temp 98.4°F | Ht 70.0 in | Wt 229.4 lb

## 2016-03-09 DIAGNOSIS — M79641 Pain in right hand: Secondary | ICD-10-CM | POA: Diagnosis not present

## 2016-03-09 DIAGNOSIS — M7989 Other specified soft tissue disorders: Secondary | ICD-10-CM | POA: Diagnosis not present

## 2016-03-09 DIAGNOSIS — T148 Other injury of unspecified body region: Secondary | ICD-10-CM | POA: Diagnosis not present

## 2016-03-09 DIAGNOSIS — Z23 Encounter for immunization: Secondary | ICD-10-CM

## 2016-03-09 DIAGNOSIS — S6991XA Unspecified injury of right wrist, hand and finger(s), initial encounter: Secondary | ICD-10-CM

## 2016-03-09 DIAGNOSIS — T148XXA Other injury of unspecified body region, initial encounter: Secondary | ICD-10-CM

## 2016-03-09 NOTE — Patient Instructions (Signed)
Please go directly to [facility] urgent care. We are concerned about fracture and your level of pain despite reassuring x-ray

## 2016-03-09 NOTE — Progress Notes (Signed)
Subjective:  [name] is a 75 y.o. year old very pleasant male patient who presents for/with See problem oriented charting ROS- no fever, chills, nausea, vomiting. No paresthesias in hands- primarily pain. .see any ROS included in HPI as well.   Past Medical History-  Patient Active Problem List   Diagnosis Date Noted  . IDA (iron deficiency anemia) 01/30/2016  . Constipation 01/30/2016  . AVM (arteriovenous malformation) of small bowel, acquired ([place]) 01/30/2016  . Absolute anemia   . Chest pain 12/26/2015  . Dizziness 12/26/2015  . DOE (dyspnea on exertion) 12/26/2015  . Vitamin B12 deficiency 12/26/2015  . Symptomatic anemia 12/15/2015  . Anemia 12/14/2015  . Low back pain syndrome 12/12/2015  . Iron deficiency anemia 10/12/2015  . Melena 10/12/2015  . AP (abdominal pain) 10/12/2015  . Personal history of colonic polyps 10/12/2015  . Personal history of arteriovenous malformation (AVM) 10/12/2015  . Chest pain with high risk for cardiac etiology 11/27/2014  . Dyspnea 11/27/2014  . Hypothyroidism 11/15/2014  . Essential hypertension 11/15/2014  . Hyperlipidemia LDL goal <70 11/15/2014  . Obstructive sleep apnea 11/15/2014  . Contact with and suspected exposure to environmental tobacco smoke 04/22/2013  . Leg pain 02/26/2012  . AVM (arteriovenous malformation) of small bowel, acquired with hemorrhage ([place]) 04/27/2010  . Coronary artery disease 10/14/2008  . GERD 01/13/2007    Medications- reviewed and updated Current Outpatient Prescriptions  Medication Sig Dispense Refill  . Ascorbic Acid (VITAMIN C PO) Take 1 tablet by mouth daily as needed (flu like symptopms).    [name] aspirin EC 81 MG tablet Take 81 mg by mouth daily.    . clonazePAM (KLONOPIN) 0.5 MG tablet Take 0.5 mg by mouth 3 (three) times daily as needed for anxiety.    . clopidogrel (PLAVIX) 75 MG tablet Take 75 mg by mouth daily.     . diclofenac sodium (VOLTAREN) 1 % GEL Apply 1 application topically  daily as needed (ankle pain). 5 Tube 10  . ezetimibe (ZETIA) 10 MG tablet Take 1 tablet (10 mg total) by mouth daily. 30 tablet 11  . ferrous sulfate 325 (65 FE) MG tablet Take 1 tablet (325 mg total) by mouth 3 (three) times daily with meals. 90 tablet 3  . fluticasone (FLONASE) 50 MCG/ACT nasal spray Place 1 spray into both nostrils daily.    . furosemide (LASIX) 40 MG tablet Take 40 mg by mouth daily as needed for fluid or edema.    [name] levothyroxine (SYNTHROID, LEVOTHROID) 100 MCG tablet TAKE 1 TABLET BY MOUTH EVERY DAY BEFORE BREAKFAST 30 tablet 1  . methocarbamol (ROBAXIN) 750 MG tablet Take 750 mg by mouth 4 (four) times daily as needed for muscle spasms.     [name] morphine (MSIR) 30 MG tablet Take 1 tablet (30 mg total) by mouth every 6 (six) hours as needed for severe pain. 120 tablet 0  . nitroGLYCERIN (NITROSTAT) 0.4 MG SL tablet PLACE 1 TABLET UNDER TONGUE EVERY 5 MINUTES AS NEEDED FOR CHEST PAIN 75 tablet 3  . omeprazole (PRILOSEC) 20 MG capsule Take 20 mg by mouth daily.    [name] oxyCODONE-acetaminophen (PERCOCET) 10-325 MG tablet Take 1 tablet by mouth daily as needed (moderate pain). 120 tablet 0  . polyethylene glycol powder (GLYCOLAX/MIRALAX) powder Take 255 g by mouth daily. 255 g 3  . ropinirole (REQUIP) 5 MG tablet TAKE 1 TABLET (5 MG TOTAL) BY MOUTH AT BEDTIME. 90 tablet 3  . Tetrahydrozoline HCl (MURINE FOR RED EYES OP) Place  1 drop into both eyes daily as needed (red eyes).     No current facility-administered medications for this visit.     Objective: BP (!) 152/80   Pulse 75   Temp 98.4 F (36.9 C) (Oral)   Ht 5\' 10"  (1.778 m)   Wt 229 lb 6 oz (104 kg)   SpO2 94%   BMI 32.91 kg/m  Gen: NAD, resting comfortably CV: RRR no murmurs rubs or gallops Lungs: CTAB no crackles, wheeze, rhonchi Ext: no pretibial edema, 2+ radial and ulnar pulses Skin: warm, dry Neuro: grossly normal, moves all extremities, intact distal sensation  Msk: patient with extensive swelling on right  arm with a line of demarcation in diagonal plane up arm- tender throughout this area. Dorsal area of hand is tender throughout with mild increase in pain along 2nd and 3rd ray. Patients ROM limited with squeezing fingers.     Dg Hand Complete Right  Result Date: 03/09/2016 CLINICAL DATA:  Trauma.  Severe swelling and pain . EXAM: RIGHT HAND - COMPLETE 3+ VIEW COMPARISON:  06/19/2011 . FINDINGS: Diffuse soft tissue swelling. No radiopaque foreign body. Bony density noted adjacent to the middle phalanx of the right fifth digit. This could represent a fracture fragment or dystrophic calcification. Age is undetermined. Tiny bony density noted along the ulnar aspect of the wrist. This is unchanged and most likely a tiny old fracture fragment. Diffuse degenerative change present. No other abnormality identified. IMPRESSION: 1. Diffuse soft tissue swelling. 2. Tiny bony density noted adjacent to the proximal aspect of the middle phalanx of the right fifth digit. This may represent a fracture fragment or dystrophic calcification. This may be old. Tiny bony density noted along the ulnar aspect of the wrist is unchanged and most likely a tiny old fracture fragment. 3.  Diffuse degenerative change. Electronically Signed   By: Marcello Moores  Register   On: 03/09/2016 16:15    Assessment/Plan:  Puncture wound - Plan: Td : Tetanus/diphtheria >7yo Preservative  Free - small puncture wound on right hand- will update Td as rsult Immunization History  Administered Date(s) Administered  . Influenza Split 07/31/2011, 07/23/2012  . Influenza Whole 04/25/2010  . Pneumococcal Polysaccharide-23 11/17/2014  . Td 03/09/2016     Right hand pain - Plan: Ambulatory referral to Orthopedics S: was working on installing break pads and was holding wrench and hand slipped and flew quickly up against a bolt in the purplish areanoted on right hand above- approximately along 4th finger ray. Had some immediate pain but pain really  started to intensify as swelling increased. He has severe aching pain. Area he hit is most tender but diffusely tender as well. Took a percocet before bed last night which helped- he is on chronic morphine and percocet. Pain rather severe today despite chronic meds. Unable to even get fingers into grip position due to swelling A/P: given level of pain, extensive swelling and despite no obvious fracture in distribution of pain (did have ? Fracture along 5th finger but has no pain in fingers themselves), I have asked patient to go to [facility] this evening for further evaluation - possible they may consider other views. I gave him a copy of x-ray report. I discussed case with Dr. Sarajane Jews who agreed with management. He has good pulses and doubt compartment syndrome but I am concerned about a missed fracture. I did independently review films and did not detect acute abnormality.   Orders Placed This Encounter  Procedures  . Td :  Tetanus/diphtheria >7yo Preservative  free  . Ambulatory referral to Orthopedics    Referral Priority:   Routine    Referral Type:   Consultation    Number of Visits Requested:   1  after 5pm of time of visit and referral coordinator not available to place consult today  The duration of face-to-face time during this visit was 25 minutes. Greater than 50% of this time was spent in counseling, explanation of diagnosis, planning of further management, and/or coordination of care.     Return precautions advised.  Garret Reddish, MD

## 2016-03-09 NOTE — Telephone Encounter (Signed)
Called and spoke with patient and asked him to go get an x-ray per Dr. Yong Channel. Patient agreed and is headed over to the [place] office for his x-ray.

## 2016-03-09 NOTE — Telephone Encounter (Signed)
[facility] - Client [facility] Patient Name: [name] DOB: 22-Jan-1941 Initial Comment Caller states he is having pain in right hand, injured it last night, worried broken. It is swollen and turning black, cannot see wrist or knuckles. Nurse Assessment Nurse: Dimas Chyle, RN, Dellis Filbert Date/Time Eilene Ghazi Time): 03/09/2016 12:59:11 PM Confirm and document reason for call. If symptomatic, describe symptoms. You must click the next button to save text entered. ---Caller states he is having pain in right hand, injured it last night, worried broken. It is swollen and turning black, cannot see wrist or knuckles. Has the patient traveled out of the country within the last 30 days? ---No Does the patient have any new or worsening symptoms? ---Yes Will a triage be completed? ---Yes Related visit to physician within the last 2 weeks? ---No Does the PT have any chronic conditions? (i.e. diabetes, asthma, etc.) ---Yes List chronic conditions. ---HTN Is this a behavioral health or substance abuse call? ---No Guidelines Guideline Title Affirmed Question Affirmed Notes Hand and Wrist Injury [1] Large swelling or bruise (> 2 inches or 5 cm) AND [2] can't use injured hand normally (e.g., make a fist, open fully, hold a glass of water) Final Disposition User See Physician within 4 Hours (or PCP triage) Dimas Chyle, RN, Dellis Filbert Referrals REFERRED TO PCP OFFICE Disagree/Comply: Leta Baptist

## 2016-03-09 NOTE — Telephone Encounter (Signed)
Patient has appointment today at 4:30 with Dr. Yong Channel.

## 2016-03-10 DIAGNOSIS — S60221A Contusion of right hand, initial encounter: Secondary | ICD-10-CM | POA: Diagnosis not present

## 2016-03-13 DIAGNOSIS — H903 Sensorineural hearing loss, bilateral: Secondary | ICD-10-CM | POA: Diagnosis not present

## 2016-03-18 ENCOUNTER — Emergency Department (HOSPITAL_COMMUNITY)
Admission: EM | Admit: 2016-03-18 | Discharge: 2016-03-18 | Disposition: A | Discharge status: Home / Self Care | Payer: Medicare Other | Attending: Dermatology | Admitting: Dermatology

## 2016-03-18 ENCOUNTER — Encounter (HOSPITAL_COMMUNITY): Payer: Self-pay

## 2016-03-18 DIAGNOSIS — I252 Old myocardial infarction: Secondary | ICD-10-CM | POA: Insufficient documentation

## 2016-03-18 DIAGNOSIS — I2510 Atherosclerotic heart disease of native coronary artery without angina pectoris: Secondary | ICD-10-CM | POA: Insufficient documentation

## 2016-03-18 DIAGNOSIS — Z5321 Procedure and treatment not carried out due to patient leaving prior to being seen by health care provider: Secondary | ICD-10-CM | POA: Insufficient documentation

## 2016-03-18 DIAGNOSIS — S60041A Contusion of right ring finger without damage to nail, initial encounter: Secondary | ICD-10-CM | POA: Diagnosis not present

## 2016-03-18 DIAGNOSIS — X12XXXA Contact with other hot fluids, initial encounter: Secondary | ICD-10-CM | POA: Diagnosis not present

## 2016-03-18 DIAGNOSIS — I110 Hypertensive heart disease with heart failure: Secondary | ICD-10-CM | POA: Diagnosis not present

## 2016-03-18 DIAGNOSIS — Y929 Unspecified place or not applicable: Secondary | ICD-10-CM | POA: Diagnosis not present

## 2016-03-18 DIAGNOSIS — S5011XA Contusion of right forearm, initial encounter: Secondary | ICD-10-CM | POA: Diagnosis not present

## 2016-03-18 DIAGNOSIS — I5032 Chronic diastolic (congestive) heart failure: Secondary | ICD-10-CM | POA: Insufficient documentation

## 2016-03-18 DIAGNOSIS — Y999 Unspecified external cause status: Secondary | ICD-10-CM | POA: Diagnosis not present

## 2016-03-18 DIAGNOSIS — Z7982 Long term (current) use of aspirin: Secondary | ICD-10-CM | POA: Diagnosis not present

## 2016-03-18 DIAGNOSIS — Z87891 Personal history of nicotine dependence: Secondary | ICD-10-CM | POA: Insufficient documentation

## 2016-03-18 DIAGNOSIS — E039 Hypothyroidism, unspecified: Secondary | ICD-10-CM | POA: Diagnosis not present

## 2016-03-18 DIAGNOSIS — Y939 Activity, unspecified: Secondary | ICD-10-CM | POA: Diagnosis not present

## 2016-03-18 NOTE — ED Triage Notes (Signed)
Onset 03-08-16 pt was working on car and hand slipped off ratchet and piece of grease fitting went into top of right hand.  Pt seen 03-09-16 at PCP who was referred to ortho office.  Pt seen ortho office on 03-10-16, xray done- no fracture. Swelling noted to top of right hand and bruising noted to right anterior and medial forearm.  Bruising noted on right ring finger.

## 2016-03-18 NOTE — ED Notes (Signed)
Pt called for room again. No answer.

## 2016-03-18 NOTE — ED Notes (Signed)
Pt called for room. No answer 

## 2016-03-19 ENCOUNTER — Encounter: Payer: Self-pay | Admitting: Family Medicine

## 2016-03-19 ENCOUNTER — Ambulatory Visit (INDEPENDENT_AMBULATORY_CARE_PROVIDER_SITE_OTHER): Payer: Medicare Other | Admitting: Family Medicine

## 2016-03-19 VITALS — BP 139/86 | HR 73 | Temp 98.2°F | Ht 70.0 in | Wt 229.0 lb

## 2016-03-19 DIAGNOSIS — S60221D Contusion of right hand, subsequent encounter: Secondary | ICD-10-CM | POA: Diagnosis not present

## 2016-03-19 DIAGNOSIS — Z23 Encounter for immunization: Secondary | ICD-10-CM | POA: Diagnosis not present

## 2016-03-19 DIAGNOSIS — H903 Sensorineural hearing loss, bilateral: Secondary | ICD-10-CM | POA: Diagnosis not present

## 2016-03-19 DIAGNOSIS — H9313 Tinnitus, bilateral: Secondary | ICD-10-CM | POA: Diagnosis not present

## 2016-03-19 MED ORDER — OXYCODONE HCL 20 MG PO TABS: 20.0000 mg | ORAL_TABLET | Freq: Four times a day (QID) | ORAL | 0 refills | Status: DC | PRN

## 2016-03-19 NOTE — Addendum Note (Signed)
Addended by: Kateri Mc E on: 03/19/2016 11:57 AM   Modules accepted: Orders

## 2016-03-19 NOTE — Progress Notes (Signed)
   Subjective:    Patient ID: [name], male    DOB: 1941-03-11, 75 y.o.   MRN: [id number]  HPI Here to follow up an injury to the right hand which occurred on 03-08-16. While repairing some brakes on a car his hand slipped and struck a piece of metal. He immediately had swelling and a lot of pain. He was seen here, then at [facility], then at the ER over the past 4 days. Xrays revealed no fractures. The Orthopedic office said there was nothing more they could. The pain is still very bothersome to him although the swelling is down slightly.    Review of Systems  Constitutional: Negative.   Respiratory: Negative.   Cardiovascular: Negative.   Musculoskeletal: Positive for arthralgias and joint swelling.       Objective:   Physical Exam  Constitutional: He appears well-developed and well-nourished.  In pain  Musculoskeletal:  The dorsal right hand has a large hematoma and it very tender. He has full ROM of the fingers. He is tender along the entire forearm to the elbow. No warmth.           Assessment & Plan:  He had a contusion to the hand which caused extensive hemorrhaging under the skin. This was exacerbated by the Plavix he takes. We will fit him with a sling to keep the hand at chest height. Use Oxycodone for pain. It will take time for the swelling to resolve. Recheck prn.

## 2016-03-19 NOTE — Progress Notes (Signed)
Pre visit review using our clinic review tool, if applicable. No additional management support is needed unless otherwise documented below in the visit note. 

## 2016-03-21 ENCOUNTER — Ambulatory Visit: Payer: Medicare Other | Admitting: Cardiology

## 2016-03-22 ENCOUNTER — Encounter: Payer: Self-pay | Admitting: Cardiovascular Disease

## 2016-03-22 ENCOUNTER — Ambulatory Visit (INDEPENDENT_AMBULATORY_CARE_PROVIDER_SITE_OTHER): Payer: Medicare Other | Admitting: Cardiovascular Disease

## 2016-03-22 VITALS — BP 130/70 | HR 72 | Ht 70.0 in | Wt 237.2 lb

## 2016-03-22 DIAGNOSIS — R001 Bradycardia, unspecified: Secondary | ICD-10-CM | POA: Diagnosis not present

## 2016-03-22 DIAGNOSIS — I5032 Chronic diastolic (congestive) heart failure: Secondary | ICD-10-CM | POA: Diagnosis not present

## 2016-03-22 DIAGNOSIS — I2510 Atherosclerotic heart disease of native coronary artery without angina pectoris: Secondary | ICD-10-CM

## 2016-03-22 DIAGNOSIS — E785 Hyperlipidemia, unspecified: Secondary | ICD-10-CM

## 2016-03-22 LAB — LIPID PANEL
CHOLESTEROL: 184 mg/dL (ref 125–200)
HDL: 40 mg/dL (ref >=40)
LDL CALC: 98 mg/dL (ref <130)
TRIGLYCERIDES: 230 mg/dL — AB (ref <150)
Total CHOL/HDL Ratio: 4.6 Ratio (ref <=5.0)
VLDL: 46 mg/dL — ABNORMAL HIGH (ref <30)

## 2016-03-22 NOTE — Patient Instructions (Addendum)
Medication Instructions:  Your physician recommends that you continue on your current medications as directed. Please refer to the Current Medication list given to you today.   Labwork: Lab work to be done today--Lipid   Testing/Procedures: none  Follow-Up: Your physician wants you to follow-up in: 6 months.  You will receive a reminder letter in the mail two months in advance. If you don't receive a letter, please call our office to schedule the follow-up appointment.   Any Other Special Instructions Will Be Listed Below (If Applicable).     If you need a refill on your cardiac medications before your next appointment, please call your pharmacy.

## 2016-03-22 NOTE — Progress Notes (Signed)
Chief Complaint  Patient presents with  . Coronary Artery Disease     History of Present Illness: 75 yo WM with history of CAD, HTN, HLD, OSA, bradycardia here today for cardiac follow up. Catheterization in 2009 showed nonobstructive CAD (50% LAD stenosis, 40% RCA stenosis). October 2011 he was admitted from the GI office was in acute GI bleeding. He required transfusions. He has been treated in the past for diastolic CHF with Lasix. Echocardiogram November 2012 showed normal LV function and normal wall motion. He was seen in the ED on 08/19/12 with chest pain by Dr. Haroldine Laws and sent home with plans for outpatient f/u. Troponin was normal and EKG was unchanged. He had been arguing with a friend that day. He began to have pain that lasted for 4 hours. The pain was severe. He is intolerant of statins. I saw him 08/27/12 in the office and arranged a cardiac cath on 09/08/12.  He was found to have mild to moderate non-obstructive CAD. No PCI was needed. His right groin was very painful after the cath. Arterial doppler without any evidence of pseudoaneurysm or AV fistula. Admitted 4/25-4/27/16 with  unstable angina. CEs remained neg. LHC demonstrated severe RPDA disease and borderline mid LAD disease. FFR of mid LAD disease demonstrated hemodynamic significance. He underwent PCI with Xience DES to RPDA and LAD.Readmitted May 2016 with chest pain and dyspnea. Relook cath may 2016 with patent stents. Brilinta changed to Plavix and symtpoms resolved. Holter monitor June 2016 with PACs, PVCs, sinus. He called in on 12/01/15 complaining of palpitations and dizziness and event monitoring was offered. He underwent colonoscopy on 12/06/15 revealing diverticula and polyps as well as non-bleeding angiodysplastic lesions in duodenum and jejunum which were treated with APC. His PCP evaluated him for lightheadedness, checked labs and found him to be significantly anemic with Hgb of 8.5, microcytic, and advised he  proceed to the ED. He received 2 U PRBC. Hemoccult was negative. Internal medicine held his Plavix and continued his aspirin. Cardiac monitor May 2017 with nocturnal bradycardia with rates as low as 22 bpm with blocked PACs. Echo June 2017 with normal LV systolic function, grade 1 diastolic dysfunction, moderate LVH. He was seen in the EP clinic by Dr. Lennie Odor 12/30/15 and sleep study was arranged. Of note, he had been non-compliant with CPAP in the past and his insurance company refused to renew.He does not wish to go for the sleep study as he cannot leave his wife.    He is here today for follow up.  He denies chest pain or dyspnea.  He is very active. No dizziness, near syncope or syncope.   Primary Care Physician: [name], MD   Past Medical History:  Diagnosis Date  . Adenomatous polyp of colon 2007  . Allergy   . Anemia   . AVM (arteriovenous malformation) 2011   a. S/p argon plasma coagulation and ablation in 2011, 2017.  . Back pain    lower back pain."inflammed disc" pain meds for this  . Bradycardia    a. H/o almost 7sec pause nocturnally during 2011 admission. Also has h/o fatigue with BB.  . Bronchitis   . CAD (coronary artery disease)    a. Nonobst disease by cath 2009. b. s/p PCI 10/2014 with DES to [place], patent by relook 11/2014 (Brilinta changed to Plavix with improved sx).  . Chronic diastolic CHF (congestive heart failure) ([place])   . Depression   . Diverticulosis   . Gastritis  2011  . GERD (gastroesophageal reflux disease)   . Headache(784.0)   . Hyperlipidemia   . Hypertension   . Hypothyroidism   . Myocardial infarction ([place])   . Obstructive sleep apnea    -no cpap use  . Premature atrial contractions Holter 2016  . PVC's (premature ventricular contractions) Holter 2016  . Restless leg syndrome   . Statin intolerance   . Transfusion history    several years ago -GI bleed    Past Surgical History:  Procedure Laterality Date  . ANKLE SURGERY Left    . APPENDECTOMY    . CARDIAC CATHETERIZATION N/A 11/26/2014   Procedure: Left Heart Cath and Coronary Angiography;  Surgeon: Burnell Blanks, MD;  Location: [facility];  Service: Cardiovascular;  Laterality: N/A;  . COLONOSCOPY  08-23-05   per Dr. Deatra Ina, adenomatous polyps, repeat in 5 yrs   . COLONOSCOPY WITH PROPOFOL N/A 12/06/2015   Procedure: COLONOSCOPY WITH PROPOFOL;  Surgeon: Doran Stabler, MD;  Location: [facility];  Service: Gastroenterology;  Laterality: N/A;  . ENTEROSCOPY N/A 12/06/2015   Procedure: ENTEROSCOPY;  Surgeon: Doran Stabler, MD;  Location: [facility];  Service: Gastroenterology;  Laterality: N/A;  . ESOPHAGOGASTRODUODENOSCOPY  08-23-05   per Dr. Deatra Ina, cauterized jejunal AVMs   . HERNIA REPAIR    . HOT HEMOSTASIS N/A 12/06/2015   Procedure: HOT HEMOSTASIS (ARGON PLASMA COAGULATION/BICAP);  Surgeon: Doran Stabler, MD;  Location: [facility];  Service: Gastroenterology;  Laterality: N/A;  . LEFT HEART CATHETERIZATION WITH CORONARY ANGIOGRAM N/A 09/08/2012   Procedure: LEFT HEART CATHETERIZATION WITH CORONARY ANGIOGRAM;  Surgeon: Burnell Blanks, MD;  Location: [facility];  Service: Cardiovascular;  Laterality: N/A;  . LEFT HEART CATHETERIZATION WITH CORONARY ANGIOGRAM N/A 11/16/2014   Procedure: LEFT HEART CATHETERIZATION WITH CORONARY ANGIOGRAM;  Surgeon: Leonie Man, MD;  Location: [facility] CATH LAB;  Service: Cardiovascular;  Laterality: N/A;  . SKIN GRAFT Right    leg  . TONSILLECTOMY      Current Outpatient Prescriptions  Medication Sig Dispense Refill  . Ascorbic Acid (VITAMIN C PO) Take 1 tablet by mouth daily as needed (flu like symptopms).    [name] aspirin EC 81 MG tablet Take 81 mg by mouth daily.    . clonazePAM (KLONOPIN) 0.5 MG tablet Take 0.5 mg by mouth 3 (three) times daily as needed for anxiety.    . clopidogrel (PLAVIX) 75 MG tablet Take 75 mg by mouth daily.     . diclofenac sodium (VOLTAREN) 1 % GEL Apply 1 application  topically daily as needed (ankle pain). 5 Tube 10  . ezetimibe (ZETIA) 10 MG tablet Take 1 tablet (10 mg total) by mouth daily. 30 tablet 11  . ferrous sulfate 325 (65 FE) MG tablet Take 1 tablet (325 mg total) by mouth 3 (three) times daily with meals. 90 tablet 3  . fluticasone (FLONASE) 50 MCG/ACT nasal spray Place 1 spray into both nostrils daily.    . furosemide (LASIX) 40 MG tablet Take 40 mg by mouth daily as needed for fluid or edema.    [name] levothyroxine (SYNTHROID, LEVOTHROID) 100 MCG tablet TAKE 1 TABLET BY MOUTH EVERY DAY BEFORE BREAKFAST 30 tablet 1  . methocarbamol (ROBAXIN) 750 MG tablet Take 750 mg by mouth 4 (four) times daily as needed for muscle spasms.     [name] morphine (MSIR) 30 MG tablet Take 1 tablet (30 mg total) by mouth every 6 (six) hours as needed for severe pain.  120 tablet 0  . nitroGLYCERIN (NITROSTAT) 0.4 MG SL tablet PLACE 1 TABLET UNDER TONGUE EVERY 5 MINUTES AS NEEDED FOR CHEST PAIN 75 tablet 3  . omeprazole (PRILOSEC) 20 MG capsule Take 20 mg by mouth daily.    . Oxycodone HCl 20 MG TABS Take 1 tablet (20 mg total) by mouth every 6 (six) hours as needed (severe pain). 120 tablet 0  . oxyCODONE-acetaminophen (PERCOCET) 10-325 MG tablet Take 1 tablet by mouth daily as needed (moderate pain). 120 tablet 0  . polyethylene glycol powder (GLYCOLAX/MIRALAX) powder Take 255 g by mouth daily. 255 g 3  . ropinirole (REQUIP) 5 MG tablet TAKE 1 TABLET (5 MG TOTAL) BY MOUTH AT BEDTIME. 90 tablet 3  . Tetrahydrozoline HCl (MURINE FOR RED EYES OP) Place 1 drop into both eyes daily as needed (red eyes).     No current facility-administered medications for this visit.     Allergies  Allergen Reactions  . Brilinta [Ticagrelor] Shortness Of Breath  . Metoprolol Tartrate Other (See Comments)    Sever chest pains " flat lined patient"  . Shellfish Allergy Anaphylaxis and Hives  . Statins Other (See Comments)    All statins cause myalgias   . Zolpidem Other (See Comments)     Chest pain  . Levaquin [Levofloxacin] Hives  . Trazodone And Nefazodone Other (See Comments)    Unsteady on feet    Social History   Social History  . Marital status: Married    Spouse name: N/A  . Number of children: 1  . Years of education: N/A   Occupational History  . Disabled Unemployed   Social History Main Topics  . Smoking status: Former Smoker    Packs/day: 2.00    Years: 50.00    Types: Cigarettes    Quit date: 07/23/2002  . Smokeless tobacco: Never Used  . Alcohol use No  . Drug use: No  . Sexual activity: Not on file   Other Topics Concern  . Not on file   Social History Narrative  . No narrative on file    Family History  Problem Relation Age of Onset  . Leukemia Mother   . Heart disease Father   . Stroke Father   . Heart attack Father   . Alcoholism Paternal Uncle   . Alcoholism Maternal Grandfather   . Colon cancer Neg Hx   . Esophageal cancer Neg Hx     Review of Systems:  As stated in the HPI and otherwise negative.   BP 130/70   Pulse 72   Ht 5\' 10"  (1.778 m)   Wt 237 lb 3.2 oz (107.6 kg)   BMI 34.03 kg/m   Physical Examination: General: Well developed, well nourished, NAD  HEENT: OP clear, mucus membranes moist  SKIN: warm, dry. No rashes. Neuro: No focal deficits  Musculoskeletal: Muscle strength 5/5 all ext  Psychiatric: Mood and affect normal  Neck: No JVD, no carotid bruits, no thyromegaly, no lymphadenopathy.  Lungs:Clear bilaterally, no wheezes, rhonci, crackles Cardiovascular: Regular rate and rhythm. No murmurs, gallops or rubs. Abdomen:Soft. Bowel sounds present. Non-tender.  Extremities: No lower extremity edema. Pulses are 2 + in the bilateral DP/PT.  Echo June 2017: Left ventricle: The cavity size was normal. There was moderate   concentric hypertrophy. Systolic function was vigorous. The   estimated ejection fraction was in the range of 65% to 70%. Wall   motion was normal; there were no regional wall motion    abnormalities. There was  an increased relative contribution of   atrial contraction to ventricular filling. Doppler parameters are   consistent with abnormal left ventricular relaxation (grade 1   diastolic dysfunction). - Aortic valve: Moderate diffuse thickening and calcification.  Cardiac cath 11/26/14: Left Anterior Descending   . Ost LAD to Prox LAD lesion, 50% stenosed. discrete . The lesion was not previously treated.   . Prox LAD to Mid LAD lesion, 0% stenosed. Previously placed Prox LAD to Mid LAD stent (unknown type) is patent.   Jorene Minors LAD lesion, 25% stenosed.      Left Circumflex   . Mid Cx lesion, 30% stenosed.     Right Coronary Artery   . Prox RCA lesion, 20% stenosed.   . Mid RCA lesion, 40% stenosed.   . Mid RCA to Dist RCA lesion, 30% stenosed.   . Right Posterior Descending Artery   . RPDA lesion, 0% stenosed. Previously placed RPDA drug eluting stent is patent.     EKG:  EKG is ordered today. The ekg ordered today demonstrates Sinus rate 73 bpm. PVC, PAC  Recent Labs: 12/26/2015: ALT 21; TSH 23.568 12/29/2015: BUN 16; Creatinine, Ser 1.08; Potassium 3.9; Sodium 138 01/30/2016: Hemoglobin 14.4; Platelets 318.0   Lipid Panel    Component Value Date/Time   CHOL 138 05/16/2015 0732   TRIG 154 (H) 05/16/2015 0732   HDL 32 (L) 05/16/2015 0732   CHOLHDL 4.3 05/16/2015 0732   VLDL 31 (H) 05/16/2015 0732   LDLCALC 75 05/16/2015 0732     Wt Readings from Last 3 Encounters:  03/22/16 237 lb 3.2 oz (107.6 kg)  03/19/16 229 lb (103.9 kg)  03/09/16 229 lb 6 oz (104 kg)     Other studies Reviewed: Additional studies/ records that were reviewed today include: . Review of the above records demonstrates:    Assessment and Plan:   1. CAD: Stable. PCI of the LAD and PDA in April 2016 with DES placed in both vessels. Relook cath May 2016 with patent stents. He did not tolerate Brilinta. Now on ASA and Plavix. He is not on a beta blocker due to bradycardia.   2.  Chronic diastolic CHF/Lower ext edema: Mild edema. He is now off of Norvasc. He uses Lasix prn.    3. Hyperlipidemia: He is intolerant of statins. (Crestor, lipitor, Zocor). He has been seen in lipid clinic and since his LDL is near goal, he is trying lifestyle modification. Check lipids today.   4. Nocturnal bradycardia: He has been seen in EP clinic. Plans for sleep study but he does not wish to do this. He will call if he has dizziness. No indication for pacemaker. Will hold off on EP follow up per pt request.   Current medicines are reviewed at length with the patient today.  The patient does not have concerns regarding medicines.  The following changes have been made:  no change  Labs/ tests ordered today include:   No orders of the defined types were placed in this encounter.    Disposition:   FU with me in 6 months   Signed, [name], MD 03/22/2016 9:10 AM    [facility] [address], [place], [place]  [address] Phone: [phone]; Fax: [phone]

## 2016-03-23 ENCOUNTER — Other Ambulatory Visit: Payer: Self-pay | Admitting: *Deleted

## 2016-03-23 MED ORDER — CLOPIDOGREL BISULFATE 75 MG PO TABS: 75.0000 mg | ORAL_TABLET | Freq: Every day | ORAL | 3 refills | Status: DC

## 2016-03-27 ENCOUNTER — Telehealth: Payer: Self-pay | Admitting: Cardiovascular Disease

## 2016-03-27 NOTE — Telephone Encounter (Signed)
Informed pt of lab results. Pt verbalized understanding. 

## 2016-03-27 NOTE — Telephone Encounter (Signed)
New message     Patient calling back for lab results.

## 2016-04-10 ENCOUNTER — Other Ambulatory Visit: Payer: Self-pay | Admitting: Family Medicine

## 2016-04-10 NOTE — Telephone Encounter (Signed)
Refill request for Omeprazole and a 90 day supply to [facility].

## 2016-04-13 MED ORDER — OMEPRAZOLE 20 MG PO CPDR: 20.0000 mg | DELAYED_RELEASE_CAPSULE | Freq: Every day | ORAL | 1 refills | Status: DC

## 2016-04-13 NOTE — Telephone Encounter (Signed)
I sent script e-scribe to below pharmacy.  

## 2016-04-30 ENCOUNTER — Telehealth: Payer: Self-pay | Admitting: Family Medicine

## 2016-04-30 MED ORDER — OXYCODONE HCL 20 MG PO TABS: 20.0000 mg | ORAL_TABLET | Freq: Four times a day (QID) | ORAL | 0 refills | Status: DC | PRN

## 2016-04-30 NOTE — Telephone Encounter (Signed)
done

## 2016-05-15 ENCOUNTER — Other Ambulatory Visit: Payer: Self-pay | Admitting: Family Medicine

## 2016-05-18 ENCOUNTER — Encounter: Payer: Self-pay | Admitting: Family Medicine

## 2016-05-18 ENCOUNTER — Ambulatory Visit (INDEPENDENT_AMBULATORY_CARE_PROVIDER_SITE_OTHER): Payer: Medicare Other | Admitting: Family Medicine

## 2016-05-18 VITALS — BP 108/70 | HR 78 | Temp 98.1°F | Ht 70.0 in | Wt 229.1 lb

## 2016-05-18 DIAGNOSIS — J988 Other specified respiratory disorders: Secondary | ICD-10-CM

## 2016-05-18 MED ORDER — DOXYCYCLINE HYCLATE 100 MG PO CAPS: 100.0000 mg | ORAL_CAPSULE | Freq: Two times a day (BID) | ORAL | 0 refills | Status: DC

## 2016-05-18 MED ORDER — BENZONATATE 100 MG PO CAPS: 100.0000 mg | ORAL_CAPSULE | Freq: Three times a day (TID) | ORAL | 0 refills | Status: DC | PRN

## 2016-05-18 NOTE — Progress Notes (Signed)
Pre visit review using our clinic review tool, if applicable. No additional management support is needed unless otherwise documented below in the visit note. 

## 2016-05-18 NOTE — Patient Instructions (Signed)
Take the doxycycline (antibiotic) as instructed. Do not get in the sun on this medication.  Take the cough medication (tessalon) as needed.  Seek care immediately if worsening, new concerns or if not improving on treatment or symptoms persist.

## 2016-05-18 NOTE — Progress Notes (Signed)
HPI:  Acute visit for Resp Infection: -started: 3 days ago, acute onset -symptoms:nasal congestion, cough, chills, SOB, thick sputum, body aches -denies:fever,NVD, tooth pain, sinus pain, rash -has tried: started antibiotic he had at home yesterday (he does not know the name) and reports is doing better today -sick contacts/travel/risks: no reported flu, strep or tick exposure -Hx of: walking pneumonia, reports zpack does not work for him  ROS: See pertinent positives and negatives per HPI.  Past Medical History:  Diagnosis Date  . Adenomatous polyp of colon 2007  . Allergy   . Anemia   . AVM (arteriovenous malformation) 2011   a. S/p argon plasma coagulation and ablation in 2011, 2017.  . Back pain    lower back pain."inflammed disc" pain meds for this  . Bradycardia    a. H/o almost 7sec pause nocturnally during 2011 admission. Also has h/o fatigue with BB.  . Bronchitis   . CAD (coronary artery disease)    a. Nonobst disease by cath 2009. b. s/p PCI 10/2014 with DES to [place], patent by relook 11/2014 (Brilinta changed to Plavix with improved sx).  . Chronic diastolic CHF (congestive heart failure) ([place])   . Depression   . Diverticulosis   . Gastritis 2011  . GERD (gastroesophageal reflux disease)   . Headache(784.0)   . Hyperlipidemia   . Hypertension   . Hypothyroidism   . Myocardial infarction   . Obstructive sleep apnea    -no cpap use  . Premature atrial contractions Holter 2016  . PVC's (premature ventricular contractions) Holter 2016  . Restless leg syndrome   . Statin intolerance   . Transfusion history    several years ago -GI bleed    Past Surgical History:  Procedure Laterality Date  . ANKLE SURGERY Left   . APPENDECTOMY    . CARDIAC CATHETERIZATION N/A 11/26/2014   Procedure: Left Heart Cath and Coronary Angiography;  Surgeon: Burnell Blanks, MD;  Location: [facility];  Service: Cardiovascular;  Laterality: N/A;  . COLONOSCOPY   08-23-05   per Dr. Deatra Ina, adenomatous polyps, repeat in 5 yrs   . COLONOSCOPY WITH PROPOFOL N/A 12/06/2015   Procedure: COLONOSCOPY WITH PROPOFOL;  Surgeon: Doran Stabler, MD;  Location: [facility];  Service: Gastroenterology;  Laterality: N/A;  . ENTEROSCOPY N/A 12/06/2015   Procedure: ENTEROSCOPY;  Surgeon: Doran Stabler, MD;  Location: [facility];  Service: Gastroenterology;  Laterality: N/A;  . ESOPHAGOGASTRODUODENOSCOPY  08-23-05   per Dr. Deatra Ina, cauterized jejunal AVMs   . HERNIA REPAIR    . HOT HEMOSTASIS N/A 12/06/2015   Procedure: HOT HEMOSTASIS (ARGON PLASMA COAGULATION/BICAP);  Surgeon: Doran Stabler, MD;  Location: [facility];  Service: Gastroenterology;  Laterality: N/A;  . LEFT HEART CATHETERIZATION WITH CORONARY ANGIOGRAM N/A 09/08/2012   Procedure: LEFT HEART CATHETERIZATION WITH CORONARY ANGIOGRAM;  Surgeon: Burnell Blanks, MD;  Location[facility];  Service: Cardiovascular;  Laterality: N/A;  . LEFT HEART CATHETERIZATION WITH CORONARY ANGIOGRAM N/A 11/16/2014   Procedure: LEFT HEART CATHETERIZATION WITH CORONARY ANGIOGRAM;  Surgeon: Leonie Man, MD;  Location: [facility];  Service: Cardiovascular;  Laterality: N/A;  . SKIN GRAFT Right    leg  . TONSILLECTOMY      Family History  Problem Relation Age of Onset  . Leukemia Mother   . Heart disease Father   . Stroke Father   . Heart attack Father   . Alcoholism Paternal Uncle   .  Alcoholism Maternal Grandfather   . Colon cancer Neg Hx   . Esophageal cancer Neg Hx     Social History   Social History  . Marital status: Married    Spouse name: N/A  . Number of children: 1  . Years of education: N/A   Occupational History  . Disabled Unemployed   Social History Main Topics  . Smoking status: Former Smoker    Packs/day: 2.00    Years: 50.00    Types: Cigarettes    Quit date: 07/23/2002  . Smokeless tobacco: Never Used  . Alcohol use No  . Drug use: No  . Sexual activity: Not Asked    Other Topics Concern  . None   Social History Narrative  . None     Current Outpatient Prescriptions:  .  Ascorbic Acid (VITAMIN C PO), Take 1 tablet by mouth daily as needed (flu like symptopms)., Disp: , Rfl:  .  aspirin EC 81 MG tablet, Take 81 mg by mouth daily., Disp: , Rfl:  .  clonazePAM (KLONOPIN) 0.5 MG tablet, Take 0.5 mg by mouth 3 (three) times daily as needed for anxiety., Disp: , Rfl:  .  clopidogrel (PLAVIX) 75 MG tablet, Take 1 tablet (75 mg total) by mouth daily., Disp: 90 tablet, Rfl: 3 .  diclofenac sodium (VOLTAREN) 1 % GEL, Apply 1 application topically daily as needed (ankle pain)., Disp: 5 Tube, Rfl: 10 .  ezetimibe (ZETIA) 10 MG tablet, Take 1 tablet (10 mg total) by mouth daily., Disp: 30 tablet, Rfl: 11 .  ferrous sulfate 325 (65 FE) MG tablet, Take 1 tablet (325 mg total) by mouth 3 (three) times daily with meals., Disp: 90 tablet, Rfl: 3 .  fluticasone (FLONASE) 50 MCG/ACT nasal spray, Place 1 spray into both nostrils daily., Disp: , Rfl:  .  furosemide (LASIX) 40 MG tablet, Take 40 mg by mouth daily as needed for fluid or edema., Disp: , Rfl:  .  levothyroxine (SYNTHROID, LEVOTHROID) 100 MCG tablet, TAKE 1 TABLET BY MOUTH EVERY DAY BEFORE BREAKFAST, Disp: 30 tablet, Rfl: 11 .  methocarbamol (ROBAXIN) 750 MG tablet, Take 750 mg by mouth 4 (four) times daily as needed for muscle spasms. , Disp: , Rfl:  .  morphine (MSIR) 30 MG tablet, Take 1 tablet (30 mg total) by mouth every 6 (six) hours as needed for severe pain., Disp: 120 tablet, Rfl: 0 .  nitroGLYCERIN (NITROSTAT) 0.4 MG SL tablet, PLACE 1 TABLET UNDER TONGUE EVERY 5 MINUTES AS NEEDED FOR CHEST PAIN, Disp: 75 tablet, Rfl: 3 .  omeprazole (PRILOSEC) 20 MG capsule, Take 1 capsule (20 mg total) by mouth daily., Disp: 90 capsule, Rfl: 1 .  Oxycodone HCl 20 MG TABS, Take 1 tablet (20 mg total) by mouth every 6 (six) hours as needed (severe pain)., Disp: 120 tablet, Rfl: 0 .  oxyCODONE-acetaminophen (PERCOCET)  10-325 MG tablet, Take 1 tablet by mouth daily as needed (moderate pain)., Disp: 120 tablet, Rfl: 0 .  polyethylene glycol powder (GLYCOLAX/MIRALAX) powder, Take 255 g by mouth daily., Disp: 255 g, Rfl: 3 .  ropinirole (REQUIP) 5 MG tablet, TAKE 1 TABLET (5 MG TOTAL) BY MOUTH AT BEDTIME., Disp: 90 tablet, Rfl: 3 .  Tetrahydrozoline HCl (MURINE FOR RED EYES OP), Place 1 drop into both eyes daily as needed (red eyes)., Disp: , Rfl:  .  benzonatate (TESSALON) 100 MG capsule, Take 1 capsule (100 mg total) by mouth 3 (three) times daily as needed for cough., Disp: 20 capsule,  Rfl: 0 .  doxycycline (VIBRAMYCIN) 100 MG capsule, Take 1 capsule (100 mg total) by mouth 2 (two) times daily., Disp: 20 capsule, Rfl: 0  EXAM:  Vitals:   05/18/16 1529  BP: 108/70  Pulse: 78  Temp: 98.1 F (36.7 C)    Body mass index is 32.87 kg/m.  GENERAL: vitals reviewed and listed above, alert, oriented, appears well hydrated and in no acute distress  HEENT: atraumatic, conjunttiva clear, no obvious abnormalities on inspection of external nose and ears, normal appearance of ear canals and TMs, clear nasal congestion, mild post oropharyngeal erythema with PND, no tonsillar edema or exudate, no sinus TTP  NECK: no obvious masses on inspection  LUNGS: clear to auscultation bilaterally except for rhoncherous breath sound R base, no wheezes, rales or rhonchi o/w, good air movement, no signs of resp distress  CV: HRRR, no peripheral edema  MS: moves all extremities without noticeable abnormality  PSYCH: pleasant and cooperative, no obvious depression or anxiety  ASSESSMENT AND PLAN:  Discussed the following assessment and plan:  Respiratory infection   We discussed potential etiologies, with influenza, viral resp infection and/ or CAP being most likely. Advised flu testing, he declined as he insists he can not have the flu since he had his flu shot. I did explain that the flu is still possible with the flu  shot. It is late on Friday and discussed options and he wanted to defer CXR and start empiric abx as he feels the abx he took yesterday helped. We discussed potential treatment option, treatment side effects, likely course, transmission, and signs of developing a serious illness. Of course, we advised to return or notify a doctor immediately if symptoms worsen or persist or new concerns arise. Advised UCC or ER if worsening over the weekend or not improving with treatment.    Patient Instructions  Take the doxycycline (antibiotic) as instructed. Do not get in the sun on this medication.  Take the cough medication (tessalon) as needed.  Seek care immediately if worsening, new concerns or if not improving on treatment or symptoms persist.   Colin Benton R., DO

## 2016-05-18 NOTE — Progress Notes (Signed)
HPI:  ROS: See pertinent positives and negatives per HPI.  Past Medical History:  Diagnosis Date  . Adenomatous polyp of colon 2007  . Allergy   . Anemia   . AVM (arteriovenous malformation) 2011   a. S/p argon plasma coagulation and ablation in 2011, 2017.  . Back pain    lower back pain."inflammed disc" pain meds for this  . Bradycardia    a. H/o almost 7sec pause nocturnally during 2011 admission. Also has h/o fatigue with BB.  . Bronchitis   . CAD (coronary artery disease)    a. Nonobst disease by cath 2009. b. s/p PCI 10/2014 with DES to [place], patent by relook 11/2014 (Brilinta changed to Plavix with improved sx).  . Chronic diastolic CHF (congestive heart failure) ([place])   . Depression   . Diverticulosis   . Gastritis 2011  . GERD (gastroesophageal reflux disease)   . Headache(784.0)   . Hyperlipidemia   . Hypertension   . Hypothyroidism   . Myocardial infarction   . Obstructive sleep apnea    -no cpap use  . Premature atrial contractions Holter 2016  . PVC's (premature ventricular contractions) Holter 2016  . Restless leg syndrome   . Statin intolerance   . Transfusion history    several years ago -GI bleed    Past Surgical History:  Procedure Laterality Date  . ANKLE SURGERY Left   . APPENDECTOMY    . CARDIAC CATHETERIZATION N/A 11/26/2014   Procedure: Left Heart Cath and Coronary Angiography;  Surgeon: Burnell Blanks, MD;  Location: [facility];  Service: Cardiovascular;  Laterality: N/A;  . COLONOSCOPY  08-23-05   per Dr. Deatra Ina, adenomatous polyps, repeat in 5 yrs   . COLONOSCOPY WITH PROPOFOL N/A 12/06/2015   Procedure: COLONOSCOPY WITH PROPOFOL;  Surgeon: Doran Stabler, MD;  Location: [facility];  Service: Gastroenterology;  Laterality: N/A;  . ENTEROSCOPY N/A 12/06/2015   Procedure: ENTEROSCOPY;  Surgeon: Doran Stabler, MD;  Location: [facility];  Service: Gastroenterology;  Laterality: N/A;  . ESOPHAGOGASTRODUODENOSCOPY   08-23-05   per Dr. Deatra Ina, cauterized jejunal AVMs   . HERNIA REPAIR    . HOT HEMOSTASIS N/A 12/06/2015   Procedure: HOT HEMOSTASIS (ARGON PLASMA COAGULATION/BICAP);  Surgeon: Doran Stabler, MD;  Location: [facility];  Service: Gastroenterology;  Laterality: N/A;  . LEFT HEART CATHETERIZATION WITH CORONARY ANGIOGRAM N/A 09/08/2012   Procedure: LEFT HEART CATHETERIZATION WITH CORONARY ANGIOGRAM;  Surgeon: Burnell Blanks, MD;  Location: [facility] CATH LAB;  Service: Cardiovascular;  Laterality: N/A;  . LEFT HEART CATHETERIZATION WITH CORONARY ANGIOGRAM N/A 11/16/2014   Procedure: LEFT HEART CATHETERIZATION WITH CORONARY ANGIOGRAM;  Surgeon: Leonie Man, MD;  Location: [facility];  Service: Cardiovascular;  Laterality: N/A;  . SKIN GRAFT Right    leg  . TONSILLECTOMY      Family History  Problem Relation Age of Onset  . Leukemia Mother   . Heart disease Father   . Stroke Father   . Heart attack Father   . Alcoholism Paternal Uncle   . Alcoholism Maternal Grandfather   . Colon cancer Neg Hx   . Esophageal cancer Neg Hx     Social History   Social History  . Marital status: Married    Spouse name: N/A  . Number of children: 1  . Years of education: N/A   Occupational History  . Disabled Unemployed   Social History Main Topics  . Smoking status: Former  Smoker    Packs/day: 2.00    Years: 50.00    Types: Cigarettes    Quit date: 07/23/2002  . Smokeless tobacco: Never Used  . Alcohol use No  . Drug use: No  . Sexual activity: Not Asked   Other Topics Concern  . None   Social History Narrative  . None     Current Outpatient Prescriptions:  .  Ascorbic Acid (VITAMIN C PO), Take 1 tablet by mouth daily as needed (flu like symptopms)., Disp: , Rfl:  .  aspirin EC 81 MG tablet, Take 81 mg by mouth daily., Disp: , Rfl:  .  clonazePAM (KLONOPIN) 0.5 MG tablet, Take 0.5 mg by mouth 3 (three) times daily as needed for anxiety., Disp: , Rfl:  .  clopidogrel (PLAVIX) 75  MG tablet, Take 1 tablet (75 mg total) by mouth daily., Disp: 90 tablet, Rfl: 3 .  diclofenac sodium (VOLTAREN) 1 % GEL, Apply 1 application topically daily as needed (ankle pain)., Disp: 5 Tube, Rfl: 10 .  ezetimibe (ZETIA) 10 MG tablet, Take 1 tablet (10 mg total) by mouth daily., Disp: 30 tablet, Rfl: 11 .  ferrous sulfate 325 (65 FE) MG tablet, Take 1 tablet (325 mg total) by mouth 3 (three) times daily with meals., Disp: 90 tablet, Rfl: 3 .  fluticasone (FLONASE) 50 MCG/ACT nasal spray, Place 1 spray into both nostrils daily., Disp: , Rfl:  .  furosemide (LASIX) 40 MG tablet, Take 40 mg by mouth daily as needed for fluid or edema., Disp: , Rfl:  .  levothyroxine (SYNTHROID, LEVOTHROID) 100 MCG tablet, TAKE 1 TABLET BY MOUTH EVERY DAY BEFORE BREAKFAST, Disp: 30 tablet, Rfl: 11 .  methocarbamol (ROBAXIN) 750 MG tablet, Take 750 mg by mouth 4 (four) times daily as needed for muscle spasms. , Disp: , Rfl:  .  morphine (MSIR) 30 MG tablet, Take 1 tablet (30 mg total) by mouth every 6 (six) hours as needed for severe pain., Disp: 120 tablet, Rfl: 0 .  nitroGLYCERIN (NITROSTAT) 0.4 MG SL tablet, PLACE 1 TABLET UNDER TONGUE EVERY 5 MINUTES AS NEEDED FOR CHEST PAIN, Disp: 75 tablet, Rfl: 3 .  omeprazole (PRILOSEC) 20 MG capsule, Take 1 capsule (20 mg total) by mouth daily., Disp: 90 capsule, Rfl: 1 .  Oxycodone HCl 20 MG TABS, Take 1 tablet (20 mg total) by mouth every 6 (six) hours as needed (severe pain)., Disp: 120 tablet, Rfl: 0 .  oxyCODONE-acetaminophen (PERCOCET) 10-325 MG tablet, Take 1 tablet by mouth daily as needed (moderate pain)., Disp: 120 tablet, Rfl: 0 .  polyethylene glycol powder (GLYCOLAX/MIRALAX) powder, Take 255 g by mouth daily., Disp: 255 g, Rfl: 3 .  ropinirole (REQUIP) 5 MG tablet, TAKE 1 TABLET (5 MG TOTAL) BY MOUTH AT BEDTIME., Disp: 90 tablet, Rfl: 3 .  Tetrahydrozoline HCl (MURINE FOR RED EYES OP), Place 1 drop into both eyes daily as needed (red eyes)., Disp: , Rfl:    EXAM:  Vitals:   05/18/16 1529  BP: 108/70  Pulse: 78  Temp: 98.1 F (36.7 C)    Body mass index is 32.87 kg/m.  GENERAL: vitals reviewed and listed above, alert, oriented, appears well hydrated and in no acute distress  HEENT: atraumatic, conjunttiva clear, no obvious abnormalities on inspection of external nose and ears  NECK: no obvious masses on inspection  LUNGS: clear to auscultation bilaterally, no wheezes, rales or rhonchi, good air movement  CV: HRRR, no peripheral edema  MS: moves all extremities without noticeable  abnormality  PSYCH: pleasant and cooperative, no obvious depression or anxiety  ASSESSMENT AND PLAN:  Discussed the following assessment and plan:  No diagnosis found.  -Patient advised to return or notify a doctor immediately if symptoms worsen or persist or new concerns arise.  There are no Patient Instructions on file for this visit.  Colin Benton R., DO

## 2016-05-29 ENCOUNTER — Telehealth: Payer: Self-pay | Admitting: Family Medicine

## 2016-05-29 NOTE — Telephone Encounter (Signed)
Yes take 2 tabs at a time for a week and let us know how that works

## 2016-05-29 NOTE — Telephone Encounter (Signed)
° °  Pt said he takes the following med and it is not working and is asking if it can be increase to 10mg . Is asking if he can take 2 instead of 1.. Would like a call back   ropinirole (REQUIP) 5 MG tablet

## 2016-05-31 NOTE — Telephone Encounter (Signed)
Called and  Spoke with pt informing him of Dr. Barbie Banner recommendations. Pt verbalized understanding, nothing further needed at this time.

## 2016-06-01 ENCOUNTER — Ambulatory Visit (INDEPENDENT_AMBULATORY_CARE_PROVIDER_SITE_OTHER)
Admission: RE | Admit: 2016-06-01 | Discharge: 2016-06-01 | Disposition: A | Discharge status: Home / Self Care | Payer: Medicare Other | Source: Ambulatory Visit | Attending: Family Medicine | Admitting: Family Medicine

## 2016-06-01 ENCOUNTER — Telehealth: Payer: Self-pay | Admitting: *Deleted

## 2016-06-01 ENCOUNTER — Encounter: Payer: Self-pay | Admitting: Family Medicine

## 2016-06-01 ENCOUNTER — Ambulatory Visit (INDEPENDENT_AMBULATORY_CARE_PROVIDER_SITE_OTHER): Payer: Medicare Other | Admitting: Family Medicine

## 2016-06-01 ENCOUNTER — Other Ambulatory Visit: Payer: Self-pay | Admitting: Family Medicine

## 2016-06-01 VITALS — BP 128/70 | HR 58 | Temp 97.4°F | Ht 70.0 in | Wt 229.0 lb

## 2016-06-01 DIAGNOSIS — R079 Chest pain, unspecified: Secondary | ICD-10-CM | POA: Diagnosis not present

## 2016-06-01 DIAGNOSIS — J069 Acute upper respiratory infection, unspecified: Secondary | ICD-10-CM | POA: Diagnosis not present

## 2016-06-01 DIAGNOSIS — R0602 Shortness of breath: Secondary | ICD-10-CM | POA: Diagnosis not present

## 2016-06-01 DIAGNOSIS — R05 Cough: Secondary | ICD-10-CM | POA: Diagnosis not present

## 2016-06-01 MED ORDER — PREDNISONE 20 MG PO TABS: 40.0000 mg | ORAL_TABLET | Freq: Every day | ORAL | 0 refills | Status: DC

## 2016-06-01 NOTE — Patient Instructions (Signed)
BEFORE YOU LEAVE: -EKG -xray sheet -follow up: 1 week  Call you cardiologist immediately if any further chest pain.  Go get the xray right away.  Go to the emergency room if further CP, worsening trouble breathing or feeling worse.

## 2016-06-01 NOTE — Progress Notes (Signed)
HPI:  [name] is here for an acute visit for:  Resp infection: -started about 10 days ago -seen 1 week ago and started on doxy - he doesn't think this has helped -symptoms include nasal congestion, PND, cough productive of white mucus, occ sob -he sees a cardiologist and reports hx CAD told not to lift heavy things - when lifted dryer yesterday had CP so took nitroglycerin - this relieved the symptoms - no CP since or prior -denies fevers, vomiting, malaise, CP or DOE today -he has heavy 2nd hand smoke exposure hx -allergic to levaquin    ROS: See pertinent positives and negatives per HPI.  Past Medical History:  Diagnosis Date  . Adenomatous polyp of colon 2007  . Allergy   . Anemia   . AVM (arteriovenous malformation) 2011   a. S/p argon plasma coagulation and ablation in 2011, 2017.  . Back pain    lower back pain."inflammed disc" pain meds for this  . Bradycardia    a. H/o almost 7sec pause nocturnally during 2011 admission. Also has h/o fatigue with BB.  . Bronchitis   . CAD (coronary artery disease)    a. Nonobst disease by cath 2009. b. s/p PCI 10/2014 with DES to [place], patent by relook 11/2014 (Brilinta changed to Plavix with improved sx).  . Chronic diastolic CHF (congestive heart failure) ([place])   . Depression   . Diverticulosis   . Gastritis 2011  . GERD (gastroesophageal reflux disease)   . Headache(784.0)   . Hyperlipidemia   . Hypertension   . Hypothyroidism   . Myocardial infarction   . Obstructive sleep apnea    -no cpap use  . Premature atrial contractions Holter 2016  . PVC's (premature ventricular contractions) Holter 2016  . Restless leg syndrome   . Statin intolerance   . Transfusion history    several years ago -GI bleed    Past Surgical History:  Procedure Laterality Date  . ANKLE SURGERY Left   . APPENDECTOMY    . CARDIAC CATHETERIZATION N/A 11/26/2014   Procedure: Left Heart Cath and Coronary Angiography;  Surgeon:  Burnell Blanks, MD;  Location: [facility];  Service: Cardiovascular;  Laterality: N/A;  . COLONOSCOPY  08-23-05   per Dr. Deatra Ina, adenomatous polyps, repeat in 5 yrs   . COLONOSCOPY WITH PROPOFOL N/A 12/06/2015   Procedure: COLONOSCOPY WITH PROPOFOL;  Surgeon: Doran Stabler, MD;  Location: [facility];  Service: Gastroenterology;  Laterality: N/A;  . ENTEROSCOPY N/A 12/06/2015   Procedure: ENTEROSCOPY;  Surgeon: Doran Stabler, MD;  Location: [facility];  Service: Gastroenterology;  Laterality: N/A;  . ESOPHAGOGASTRODUODENOSCOPY  08-23-05   per Dr. Deatra Ina, cauterized jejunal AVMs   . HERNIA REPAIR    . HOT HEMOSTASIS N/A 12/06/2015   Procedure: HOT HEMOSTASIS (ARGON PLASMA COAGULATION/BICAP);  Surgeon: Doran Stabler, MD;  Location: [facility];  Service: Gastroenterology;  Laterality: N/A;  . LEFT HEART CATHETERIZATION WITH CORONARY ANGIOGRAM N/A 09/08/2012   Procedure: LEFT HEART CATHETERIZATION WITH CORONARY ANGIOGRAM;  Surgeon: Burnell Blanks, MD;  Location: [facility];  Service: Cardiovascular;  Laterality: N/A;  . LEFT HEART CATHETERIZATION WITH CORONARY ANGIOGRAM N/A 11/16/2014   Procedure: LEFT HEART CATHETERIZATION WITH CORONARY ANGIOGRAM;  Surgeon: Leonie Man, MD;  Location: [facility] CATH LAB;  Service: Cardiovascular;  Laterality: N/A;  . SKIN GRAFT Right    leg  . TONSILLECTOMY      Family History  Problem Relation  Age of Onset  . Leukemia Mother   . Heart disease Father   . Stroke Father   . Heart attack Father   . Alcoholism Paternal Uncle   . Alcoholism Maternal Grandfather   . Colon cancer Neg Hx   . Esophageal cancer Neg Hx     Social History   Social History  . Marital status: Married    Spouse name: N/A  . Number of children: 1  . Years of education: N/A   Occupational History  . Disabled Unemployed   Social History Main Topics  . Smoking status: Former Smoker    Packs/day: 2.00    Years: 50.00    Types: Cigarettes     Quit date: 07/23/2002  . Smokeless tobacco: Never Used  . Alcohol use No  . Drug use: No  . Sexual activity: Not Asked   Other Topics Concern  . None   Social History Narrative  . None     Current Outpatient Prescriptions:  .  Ascorbic Acid (VITAMIN C PO), Take 1 tablet by mouth daily as needed (flu like symptopms)., Disp: , Rfl:  .  aspirin EC 81 MG tablet, Take 81 mg by mouth daily., Disp: , Rfl:  .  benzonatate (TESSALON) 100 MG capsule, Take 1 capsule (100 mg total) by mouth 3 (three) times daily as needed for cough., Disp: 20 capsule, Rfl: 0 .  clonazePAM (KLONOPIN) 0.5 MG tablet, Take 0.5 mg by mouth 3 (three) times daily as needed for anxiety., Disp: , Rfl:  .  clopidogrel (PLAVIX) 75 MG tablet, Take 1 tablet (75 mg total) by mouth daily., Disp: 90 tablet, Rfl: 3 .  diclofenac sodium (VOLTAREN) 1 % GEL, Apply 1 application topically daily as needed (ankle pain)., Disp: 5 Tube, Rfl: 10 .  doxycycline (VIBRAMYCIN) 100 MG capsule, Take 1 capsule (100 mg total) by mouth 2 (two) times daily., Disp: 20 capsule, Rfl: 0 .  ezetimibe (ZETIA) 10 MG tablet, Take 1 tablet (10 mg total) by mouth daily., Disp: 30 tablet, Rfl: 11 .  ferrous sulfate 325 (65 FE) MG tablet, Take 1 tablet (325 mg total) by mouth 3 (three) times daily with meals., Disp: 90 tablet, Rfl: 3 .  fluticasone (FLONASE) 50 MCG/ACT nasal spray, Place 1 spray into both nostrils daily., Disp: , Rfl:  .  furosemide (LASIX) 40 MG tablet, Take 40 mg by mouth daily as needed for fluid or edema., Disp: , Rfl:  .  levothyroxine (SYNTHROID, LEVOTHROID) 100 MCG tablet, TAKE 1 TABLET BY MOUTH EVERY DAY BEFORE BREAKFAST, Disp: 30 tablet, Rfl: 11 .  methocarbamol (ROBAXIN) 750 MG tablet, Take 750 mg by mouth 4 (four) times daily as needed for muscle spasms. , Disp: , Rfl:  .  morphine (MSIR) 30 MG tablet, Take 1 tablet (30 mg total) by mouth every 6 (six) hours as needed for severe pain., Disp: 120 tablet, Rfl: 0 .  nitroGLYCERIN  (NITROSTAT) 0.4 MG SL tablet, PLACE 1 TABLET UNDER TONGUE EVERY 5 MINUTES AS NEEDED FOR CHEST PAIN, Disp: 75 tablet, Rfl: 3 .  omeprazole (PRILOSEC) 20 MG capsule, Take 1 capsule (20 mg total) by mouth daily., Disp: 90 capsule, Rfl: 1 .  Oxycodone HCl 20 MG TABS, Take 1 tablet (20 mg total) by mouth every 6 (six) hours as needed (severe pain)., Disp: 120 tablet, Rfl: 0 .  oxyCODONE-acetaminophen (PERCOCET) 10-325 MG tablet, Take 1 tablet by mouth daily as needed (moderate pain)., Disp: 120 tablet, Rfl: 0 .  polyethylene glycol powder (  GLYCOLAX/MIRALAX) powder, Take 255 g by mouth daily., Disp: 255 g, Rfl: 3 .  ropinirole (REQUIP) 5 MG tablet, TAKE 1 TABLET (5 MG TOTAL) BY MOUTH AT BEDTIME. (Patient taking differently: 10 mg. TAKE 1 TABLET (5 MG TOTAL) BY MOUTH AT BEDTIME.), Disp: 90 tablet, Rfl: 3 .  Tetrahydrozoline HCl (MURINE FOR RED EYES OP), Place 1 drop into both eyes daily as needed (red eyes)., Disp: , Rfl:   EXAM:  Vitals:   06/01/16 1120  BP: 128/70  Pulse: (!) 58  Temp: 97.4 F (36.3 C)    Body mass index is 32.86 kg/m.  GENERAL: vitals reviewed and listed above, alert, oriented, appears well hydrated and in no acute distress  HEENT: atraumatic, conjunttiva clear, no obvious abnormalities on inspection of external nose and ears  NECK: no obvious masses on inspection  LUNGS: clear to auscultation bilaterally, no wheezes, rales or rhonchi, good air movement  CV: HRRR, no peripheral edema  MS: moves all extremities without noticeable abnormality  PSYCH: pleasant and cooperative, no obvious depression or anxiety  ASSESSMENT AND PLAN:  Discussed the following assessment and plan:  Acute upper respiratory infection - Plan: DG Chest 2 View  Chest pain, unspecified type  -lung exam improved, no signs resp distress- but symptoms concerning for persisted viral or bacterial resp or sinus infection vs bronchitis/post infectious RAD -opted for CXR to further eval -after  discussion options, if PNA confirmed will change to augmentin with prednisone as he does not want to go to the hospital  - or add prednisone if neg -EKG ok with NSR, mild stable TW abnormalities - advised if any further CP, DOE or heart concerns to call his cardiologist right away -Patient advised to return or notify a doctor immediately if symptoms worsen or persist or new concerns arise.  Patient Instructions  BEFORE YOU LEAVE: -EKG -xray sheet -follow up: 1 week  Call you cardiologist immediately if any further chest pain.  Go get the xray right away.  Go to the emergency room if further CP, worsening trouble breathing or feeling worse.    Colin Benton R., DO

## 2016-06-01 NOTE — Telephone Encounter (Signed)
Per Dr Maudie Mercury, the x-ray looks good and Prednisone was sent to his pharmacy and  I called the pt and left a detailed message with this information  at his cell number and home number.

## 2016-06-01 NOTE — Progress Notes (Signed)
Pre visit review using our clinic review tool, if applicable. No additional management support is needed unless otherwise documented below in the visit note. 

## 2016-06-01 NOTE — Telephone Encounter (Signed)
I spoke with pt, went over below directions and updated medication dose change.

## 2016-06-04 ENCOUNTER — Other Ambulatory Visit: Payer: Self-pay | Admitting: Family Medicine

## 2016-06-05 ENCOUNTER — Other Ambulatory Visit: Payer: Self-pay | Admitting: Family Medicine

## 2016-06-06 NOTE — Telephone Encounter (Signed)
Just want to clarify dose change, looks like 10 mg every day according to snap shot page?

## 2016-06-08 ENCOUNTER — Encounter: Payer: Self-pay | Admitting: Family Medicine

## 2016-06-08 ENCOUNTER — Ambulatory Visit (INDEPENDENT_AMBULATORY_CARE_PROVIDER_SITE_OTHER): Payer: Medicare Other | Admitting: Family Medicine

## 2016-06-08 VITALS — BP 137/84 | HR 76 | Temp 97.9°F | Ht 70.0 in | Wt 230.0 lb

## 2016-06-08 DIAGNOSIS — M109 Gout, unspecified: Secondary | ICD-10-CM

## 2016-06-08 MED ORDER — ROPINIROLE HCL 5 MG PO TABS: 10.0000 mg | ORAL_TABLET | Freq: Every day | ORAL | 5 refills | Status: DC

## 2016-06-08 MED ORDER — METHYLPREDNISOLONE ACETATE 80 MG/ML IJ SUSP
160.0000 mg | Freq: Once | INTRAMUSCULAR | Status: AC
  Administered 2016-06-08: 160 mg via INTRAMUSCULAR

## 2016-06-08 MED ORDER — OXYCODONE HCL 20 MG PO TABS: 20.0000 mg | ORAL_TABLET | Freq: Four times a day (QID) | ORAL | 0 refills | Status: DC | PRN
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
## 2016-06-08 NOTE — Progress Notes (Signed)
Pre visit review using our clinic review tool, if applicable. No additional management support is needed unless otherwise documented below in the visit note. 

## 2016-06-10 ENCOUNTER — Encounter (HOSPITAL_COMMUNITY): Payer: Self-pay | Admitting: *Deleted

## 2016-06-10 ENCOUNTER — Emergency Department (HOSPITAL_COMMUNITY): Payer: No Typology Code available for payment source

## 2016-06-10 ENCOUNTER — Emergency Department (HOSPITAL_COMMUNITY)
Admission: EM | Admit: 2016-06-10 | Discharge: 2016-06-10 | Disposition: A | Discharge status: Home / Self Care | Payer: No Typology Code available for payment source | Attending: Emergency Medicine | Admitting: Emergency Medicine

## 2016-06-10 DIAGNOSIS — Y92481 Parking lot as the place of occurrence of the external cause: Secondary | ICD-10-CM | POA: Insufficient documentation

## 2016-06-10 DIAGNOSIS — S8012XA Contusion of left lower leg, initial encounter: Secondary | ICD-10-CM | POA: Diagnosis not present

## 2016-06-10 DIAGNOSIS — S20312A Abrasion of left front wall of thorax, initial encounter: Secondary | ICD-10-CM | POA: Diagnosis not present

## 2016-06-10 DIAGNOSIS — I110 Hypertensive heart disease with heart failure: Secondary | ICD-10-CM | POA: Diagnosis not present

## 2016-06-10 DIAGNOSIS — K219 Gastro-esophageal reflux disease without esophagitis: Secondary | ICD-10-CM

## 2016-06-10 DIAGNOSIS — S299XXA Unspecified injury of thorax, initial encounter: Secondary | ICD-10-CM | POA: Diagnosis not present

## 2016-06-10 DIAGNOSIS — E785 Hyperlipidemia, unspecified: Secondary | ICD-10-CM | POA: Diagnosis not present

## 2016-06-10 DIAGNOSIS — R220 Localized swelling, mass and lump, head: Secondary | ICD-10-CM | POA: Diagnosis not present

## 2016-06-10 DIAGNOSIS — R109 Unspecified abdominal pain: Secondary | ICD-10-CM | POA: Insufficient documentation

## 2016-06-10 DIAGNOSIS — S0990XA Unspecified injury of head, initial encounter: Secondary | ICD-10-CM | POA: Insufficient documentation

## 2016-06-10 DIAGNOSIS — I2510 Atherosclerotic heart disease of native coronary artery without angina pectoris: Secondary | ICD-10-CM

## 2016-06-10 DIAGNOSIS — Y9389 Activity, other specified: Secondary | ICD-10-CM | POA: Diagnosis not present

## 2016-06-10 DIAGNOSIS — Z7982 Long term (current) use of aspirin: Secondary | ICD-10-CM | POA: Insufficient documentation

## 2016-06-10 DIAGNOSIS — E039 Hypothyroidism, unspecified: Secondary | ICD-10-CM | POA: Insufficient documentation

## 2016-06-10 DIAGNOSIS — S36892A Contusion of other intra-abdominal organs, initial encounter: Secondary | ICD-10-CM | POA: Diagnosis not present

## 2016-06-10 DIAGNOSIS — Z79899 Other long term (current) drug therapy: Secondary | ICD-10-CM | POA: Diagnosis not present

## 2016-06-10 DIAGNOSIS — S99921A Unspecified injury of right foot, initial encounter: Secondary | ICD-10-CM | POA: Diagnosis not present

## 2016-06-10 DIAGNOSIS — S99922A Unspecified injury of left foot, initial encounter: Secondary | ICD-10-CM | POA: Diagnosis not present

## 2016-06-10 DIAGNOSIS — S8992XA Unspecified injury of left lower leg, initial encounter: Secondary | ICD-10-CM | POA: Diagnosis not present

## 2016-06-10 DIAGNOSIS — R0781 Pleurodynia: Secondary | ICD-10-CM | POA: Diagnosis not present

## 2016-06-10 DIAGNOSIS — Z87891 Personal history of nicotine dependence: Secondary | ICD-10-CM | POA: Diagnosis not present

## 2016-06-10 DIAGNOSIS — S0083XA Contusion of other part of head, initial encounter: Secondary | ICD-10-CM | POA: Diagnosis not present

## 2016-06-10 DIAGNOSIS — M25572 Pain in left ankle and joints of left foot: Secondary | ICD-10-CM | POA: Diagnosis not present

## 2016-06-10 DIAGNOSIS — M542 Cervicalgia: Secondary | ICD-10-CM | POA: Diagnosis not present

## 2016-06-10 DIAGNOSIS — S0001XA Abrasion of scalp, initial encounter: Secondary | ICD-10-CM | POA: Diagnosis not present

## 2016-06-10 DIAGNOSIS — I5032 Chronic diastolic (congestive) heart failure: Secondary | ICD-10-CM | POA: Diagnosis not present

## 2016-06-10 DIAGNOSIS — I25119 Atherosclerotic heart disease of native coronary artery with unspecified angina pectoris: Secondary | ICD-10-CM | POA: Diagnosis present

## 2016-06-10 DIAGNOSIS — S199XXA Unspecified injury of neck, initial encounter: Secondary | ICD-10-CM | POA: Diagnosis not present

## 2016-06-10 DIAGNOSIS — S0081XA Abrasion of other part of head, initial encounter: Secondary | ICD-10-CM | POA: Insufficient documentation

## 2016-06-10 DIAGNOSIS — S36898A Other injury of other intra-abdominal organs, initial encounter: Secondary | ICD-10-CM | POA: Diagnosis not present

## 2016-06-10 DIAGNOSIS — R0602 Shortness of breath: Secondary | ICD-10-CM | POA: Insufficient documentation

## 2016-06-10 DIAGNOSIS — R079 Chest pain, unspecified: Secondary | ICD-10-CM | POA: Diagnosis not present

## 2016-06-10 DIAGNOSIS — I252 Old myocardial infarction: Secondary | ICD-10-CM | POA: Diagnosis not present

## 2016-06-10 DIAGNOSIS — Y999 Unspecified external cause status: Secondary | ICD-10-CM | POA: Diagnosis not present

## 2016-06-10 DIAGNOSIS — M79662 Pain in left lower leg: Secondary | ICD-10-CM | POA: Diagnosis not present

## 2016-06-10 DIAGNOSIS — D509 Iron deficiency anemia, unspecified: Secondary | ICD-10-CM | POA: Diagnosis present

## 2016-06-10 DIAGNOSIS — G4733 Obstructive sleep apnea (adult) (pediatric): Secondary | ICD-10-CM | POA: Diagnosis present

## 2016-06-10 LAB — I-STAT CHEM 8, ED
BUN: 22 mg/dL — AB (ref 6–20)
CALCIUM ION: 1.1 mmol/L — AB (ref 1.15–1.40)
CREATININE: 1 mg/dL (ref 0.61–1.24)
Chloride: 102 mmol/L (ref 101–111)
GLUCOSE: 116 mg/dL — AB (ref 65–99)
HCT: 47 % (ref 39.0–52.0)
Hemoglobin: 16 g/dL (ref 13.0–17.0)
Potassium: 4.3 mmol/L (ref 3.5–5.1)
Sodium: 142 mmol/L (ref 135–145)
TCO2: 26 mmol/L (ref 0–100)

## 2016-06-10 LAB — COMPREHENSIVE METABOLIC PANEL
ALT: 42 U/L (ref 17–63)
ANION GAP: 7 (ref 5–15)
AST: 53 U/L — ABNORMAL HIGH (ref 15–41)
Albumin: 4 g/dL (ref 3.5–5.0)
Alkaline Phosphatase: 47 U/L (ref 38–126)
BILIRUBIN TOTAL: 0.9 mg/dL (ref 0.3–1.2)
BUN: 19 mg/dL (ref 6–20)
CO2: 25 mmol/L (ref 22–32)
Calcium: 8.9 mg/dL (ref 8.9–10.3)
Chloride: 106 mmol/L (ref 101–111)
Creatinine, Ser: 1.09 mg/dL (ref 0.61–1.24)
Glucose, Bld: 120 mg/dL — ABNORMAL HIGH (ref 65–99)
POTASSIUM: 4.3 mmol/L (ref 3.5–5.1)
Sodium: 138 mmol/L (ref 135–145)
TOTAL PROTEIN: 6.6 g/dL (ref 6.5–8.1)

## 2016-06-10 LAB — CBC
HEMATOCRIT: 47.3 % (ref 39.0–52.0)
Hemoglobin: 15.6 g/dL (ref 13.0–17.0)
MCH: 31.5 pg (ref 26.0–34.0)
MCHC: 33 g/dL (ref 30.0–36.0)
MCV: 95.4 fL (ref 78.0–100.0)
Platelets: 259 10*3/uL (ref 150–400)
RBC: 4.96 MIL/uL (ref 4.22–5.81)
RDW: 14.6 % (ref 11.5–15.5)
WBC: 15.8 10*3/uL — AB (ref 4.0–10.5)

## 2016-06-10 LAB — PROTIME-INR
INR: 1.03
PROTHROMBIN TIME: 13.6 s (ref 11.4–15.2)

## 2016-06-10 LAB — ETHANOL: Alcohol, Ethyl (B): <5 mg/dL (ref <5)

## 2016-06-10 LAB — I-STAT CG4 LACTIC ACID, ED: LACTIC ACID, VENOUS: 1.84 mmol/L (ref 0.5–1.9)

## 2016-06-10 LAB — SAMPLE TO BLOOD BANK

## 2016-06-10 MED ORDER — MORPHINE SULFATE (PF) 4 MG/ML IV SOLN: 2.0000 mg | Freq: Once | INTRAVENOUS | Status: DC

## 2016-06-10 MED ORDER — MORPHINE SULFATE (PF) 4 MG/ML IV SOLN
2.0000 mg | Freq: Once | INTRAVENOUS | Status: AC
  Administered 2016-06-10: 2 mg via INTRAVENOUS
  Filled 2016-06-10: qty 1

## 2016-06-10 MED ORDER — MORPHINE SULFATE (PF) 4 MG/ML IV SOLN
4.0000 mg | Freq: Once | INTRAVENOUS | Status: AC
  Administered 2016-06-10: 4 mg via INTRAVENOUS
  Filled 2016-06-10: qty 1

## 2016-06-10 MED ORDER — ALBUTEROL SULFATE (2.5 MG/3ML) 0.083% IN NEBU
5.0000 mg | INHALATION_SOLUTION | Freq: Once | RESPIRATORY_TRACT | Status: AC
  Administered 2016-06-10: 5 mg via RESPIRATORY_TRACT
  Filled 2016-06-10: qty 6

## 2016-06-10 MED ORDER — IOPAMIDOL (ISOVUE-300) INJECTION 61%
INTRAVENOUS | Status: AC
  Administered 2016-06-10: 100 mL
  Filled 2016-06-10: qty 100

## 2016-06-10 MED ORDER — LORAZEPAM 2 MG/ML IJ SOLN
0.5000 mg | Freq: Once | INTRAMUSCULAR | Status: AC
  Administered 2016-06-10: 0.5 mg via INTRAVENOUS
  Filled 2016-06-10: qty 1

## 2016-06-10 NOTE — ED Notes (Signed)
Patient undressed, in gown, on monitor, continuous pulse oximetry and blood pressure cuff; patient placed on oxygen [place](2L)
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
## 2016-06-10 NOTE — ED Notes (Signed)
Pt transported to CT ?
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
## 2016-06-10 NOTE — ED Notes (Signed)
Provided pt with ice chips and a pillow.

## 2016-06-10 NOTE — ED Notes (Signed)
Pt to be upgraded to a level 2 per Dr. Jeanell Sparrow.
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
## 2016-06-19 ENCOUNTER — Encounter: Payer: Self-pay | Admitting: Adult Health

## 2016-06-19 ENCOUNTER — Ambulatory Visit (INDEPENDENT_AMBULATORY_CARE_PROVIDER_SITE_OTHER): Payer: Medicare Other | Admitting: Adult Health

## 2016-06-19 DIAGNOSIS — L03116 Cellulitis of left lower limb: Secondary | ICD-10-CM

## 2016-06-19 MED ORDER — CEPHALEXIN 500 MG PO CAPS: 500.0000 mg | ORAL_CAPSULE | Freq: Three times a day (TID) | ORAL | 0 refills | Status: DC
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
## 2016-06-19 NOTE — Patient Instructions (Addendum)
It was great meeting you and I am so sorry you are in so much pain.   It appears as though you may have a skin infection in your left leg. I have sent in prescription for Kelfex, take this three times a day for 10 days.   Continue with your prescribed pain medication   Follow up with Dr. Sarajane Jews on Thursday or Friday of this week    Cellulitis, Adult Introduction Cellulitis is a skin infection. The infected area is usually red and sore. This condition occurs most often in the arms and lower legs. It is very important to get treated for this condition. Follow these instructions at home:  Take over-the-counter and prescription medicines only as told by your doctor.  If you were prescribed an antibiotic medicine, take it as told by your doctor. Do not stop taking the antibiotic even if you start to feel better.  Drink enough fluid to keep your pee (urine) clear or pale yellow.  Do not touch or rub the infected area.  Raise (elevate) the infected area above the level of your heart while you are sitting or lying down.  Place warm or cold wet cloths (warm or cold compresses) on the infected area. Do this as told by your doctor.  Keep all follow-up visits as told by your doctor. This is important. These visits let your doctor make sure your infection is not getting worse. Contact a doctor if:  You have a fever.  Your symptoms do not get better after 1-2 days of treatment.  Your bone or joint under the infected area starts to hurt after the skin has healed.  Your infection comes back. This can happen in the same area or another area.  You have a swollen bump in the infected area.  You have new symptoms.  You feel ill and also have muscle aches and pains. Get help right away if:  Your symptoms get worse.  You feel very sleepy.  You throw up (vomit) or have watery poop (diarrhea) for a long time.  There are red streaks coming from the infected area.  Your red area gets  larger.  Your red area turns darker. This information is not intended to replace advice given to you by your health care provider. Make sure you discuss any questions you have with your health care provider. Document Released: 12/26/2007 Document Revised: 12/15/2015 Document Reviewed: 05/18/2015  2017 Elsevier

## 2016-06-20 MED ORDER — CEPHALEXIN 500 MG PO CAPS: 500.0000 mg | ORAL_CAPSULE | Freq: Three times a day (TID) | ORAL | 0 refills | Status: DC

## 2016-06-20 NOTE — Addendum Note (Signed)
Addended by: Sandria Bales B on: 06/20/2016 03:30 PM   Modules accepted: Orders

## 2016-06-22 ENCOUNTER — Ambulatory Visit (INDEPENDENT_AMBULATORY_CARE_PROVIDER_SITE_OTHER): Payer: Medicare Other | Admitting: Family Medicine

## 2016-06-22 ENCOUNTER — Encounter: Payer: Self-pay | Admitting: Family Medicine

## 2016-06-22 VITALS — BP 122/82 | HR 60 | Temp 97.5°F | Wt 230.3 lb

## 2016-06-22 DIAGNOSIS — L03116 Cellulitis of left lower limb: Secondary | ICD-10-CM | POA: Diagnosis not present

## 2016-06-22 NOTE — Progress Notes (Signed)
Pre visit review using our clinic review tool, if applicable. No additional management support is needed unless otherwise documented below in the visit note. 
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
## 2016-07-02 ENCOUNTER — Encounter: Payer: Self-pay | Admitting: Family Medicine

## 2016-07-02 ENCOUNTER — Other Ambulatory Visit: Payer: Self-pay | Admitting: Family Medicine

## 2016-07-02 ENCOUNTER — Other Ambulatory Visit: Payer: Self-pay | Admitting: Physician Assistant

## 2016-07-02 ENCOUNTER — Telehealth: Payer: Self-pay | Admitting: Family Medicine

## 2016-07-02 ENCOUNTER — Ambulatory Visit (INDEPENDENT_AMBULATORY_CARE_PROVIDER_SITE_OTHER): Payer: Medicare Other | Admitting: Family Medicine

## 2016-07-02 VITALS — BP 130/86 | HR 78 | Temp 97.8°F | Wt 227.0 lb

## 2016-07-02 DIAGNOSIS — K5903 Drug induced constipation: Secondary | ICD-10-CM

## 2016-07-02 DIAGNOSIS — R109 Unspecified abdominal pain: Secondary | ICD-10-CM

## 2016-07-02 NOTE — Progress Notes (Signed)
Pre visit review using our clinic review tool, if applicable. No additional management support is needed unless otherwise documented below in the visit note. 

## 2016-07-02 NOTE — Telephone Encounter (Signed)
FYI. Pt coming to see Almyra Free today.
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
## 2016-07-02 NOTE — Patient Instructions (Signed)
Please use miralax daily for constipation and follow up with Dr. Sarajane Jews if pain does not improve in 3 to 4 days with bowel movement, pain worsens, or you develop fever, chills, sweats, or new symptoms develop.   Constipation, Adult Constipation is when a person:  Poops (has a bowel movement) fewer times in a week than normal.  Has a hard time pooping.  Has poop that is dry, hard, or bigger than normal. Follow these instructions at home: Eating and drinking  Eat foods that have a lot of fiber, such as:  Fresh fruits and vegetables.  Whole grains.  Beans.  Eat less of foods that are high in fat, low in fiber, or overly processed, such as:  [place] fries.  Hamburgers.  Cookies.  Candy.  Soda.  Drink enough fluid to keep your pee (urine) clear or pale yellow. General instructions  Exercise regularly or as told by your doctor.  Go to the restroom when you feel like you need to poop. Do not hold it in.  Take over-the-counter and prescription medicines only as told by your doctor. These include any fiber supplements.  Do pelvic floor retraining exercises, such as:  Doing deep breathing while relaxing your lower belly (abdomen).  Relaxing your pelvic floor while pooping.  Watch your condition for any changes.  Keep all follow-up visits as told by your doctor. This is important. Contact a doctor if:  You have pain that gets worse.  You have a fever.  You have not pooped for 4 days.  You throw up (vomit).  You are not hungry.  You lose weight.  You are bleeding from the anus.  You have thin, pencil-like poop (stool). Get help right away if:  You have a fever, and your symptoms suddenly get worse.  You leak poop or have blood in your poop.  Your belly feels hard or bigger than normal (is bloated).  You have very bad belly pain.  You feel dizzy or you faint. This information is not intended to replace advice given to you by your health care provider.  Make sure you discuss any questions you have with your health care provider. Document Released: 12/26/2007 Document Revised: 01/27/2016 Document Reviewed: 12/28/2015 Elsevier Interactive Patient Education  2017 [employer].

## 2016-07-03 ENCOUNTER — Other Ambulatory Visit: Payer: Self-pay | Admitting: Family Medicine

## 2016-07-03 NOTE — Telephone Encounter (Signed)
Last filled on 10/07/15 for 6 months.  Message in chart says pt is taking .5 tabs tid.  Please advise.  Thanks!!

## 2016-07-03 NOTE — Telephone Encounter (Signed)
Call  In #[phone] with 5 rf

## 2016-07-03 NOTE — Telephone Encounter (Signed)
Denied.  Filled on 05/15/16 for 1 year.  Message sent to the pharmacy advising them to check their file.

## 2016-07-04 NOTE — Telephone Encounter (Signed)
Looks like pt get a 90 day supply.  Will need to verify.  Dr. Sarajane Jews only authorized #[phone].

## 2016-07-05 NOTE — Telephone Encounter (Signed)
Called to the pharmacy and left on machine. 

## 2016-07-05 NOTE — Telephone Encounter (Signed)
Change this to #[phone] with one rf

## 2016-07-06 ENCOUNTER — Ambulatory Visit (INDEPENDENT_AMBULATORY_CARE_PROVIDER_SITE_OTHER): Payer: Medicare Other | Admitting: Family Medicine

## 2016-07-06 ENCOUNTER — Telehealth: Payer: Self-pay | Admitting: Family Medicine

## 2016-07-06 ENCOUNTER — Ambulatory Visit (INDEPENDENT_AMBULATORY_CARE_PROVIDER_SITE_OTHER)
Admission: RE | Admit: 2016-07-06 | Discharge: 2016-07-06 | Disposition: A | Discharge status: Home / Self Care | Payer: Medicare Other | Source: Ambulatory Visit | Attending: Family Medicine | Admitting: Family Medicine

## 2016-07-06 ENCOUNTER — Encounter: Payer: Self-pay | Admitting: Family Medicine

## 2016-07-06 VITALS — BP 146/74 | HR 64 | Temp 97.6°F | Ht 70.0 in | Wt 226.0 lb

## 2016-07-06 DIAGNOSIS — R1031 Right lower quadrant pain: Secondary | ICD-10-CM | POA: Diagnosis not present

## 2016-07-06 DIAGNOSIS — S3991XA Unspecified injury of abdomen, initial encounter: Secondary | ICD-10-CM | POA: Diagnosis not present

## 2016-07-06 LAB — HEPATIC FUNCTION PANEL
ALK PHOS: 61 U/L (ref 39–117)
ALT: 19 U/L (ref 0–53)
AST: 17 U/L (ref 0–37)
Albumin: 4.6 g/dL (ref 3.5–5.2)
BILIRUBIN TOTAL: 0.8 mg/dL (ref 0.2–1.2)
Bilirubin, Direct: 0.2 mg/dL (ref 0.0–0.3)
Total Protein: 6.9 g/dL (ref 6.0–8.3)

## 2016-07-06 LAB — CBC WITH DIFFERENTIAL/PLATELET
BASOS ABS: 0.1 10*3/uL (ref 0.0–0.1)
Basophils Relative: 0.5 % (ref 0.0–3.0)
Eosinophils Absolute: 0.6 10*3/uL (ref 0.0–0.7)
Eosinophils Relative: 4.8 % (ref 0.0–5.0)
HCT: 48.5 % (ref 39.0–52.0)
HEMOGLOBIN: 16.2 g/dL (ref 13.0–17.0)
LYMPHS ABS: 2.7 10*3/uL (ref 0.7–4.0)
Lymphocytes Relative: 21.9 % (ref 12.0–46.0)
MCHC: 33.5 g/dL (ref 30.0–36.0)
MCV: 93.7 fl (ref 78.0–100.0)
MONO ABS: 1 10*3/uL (ref 0.1–1.0)
MONOS PCT: 8.4 % (ref 3.0–12.0)
NEUTROS PCT: 64.4 % (ref 43.0–77.0)
Neutro Abs: 7.9 10*3/uL — ABNORMAL HIGH (ref 1.4–7.7)
Platelets: 298 10*3/uL (ref 150.0–400.0)
RBC: 5.17 Mil/uL (ref 4.22–5.81)
RDW: 15.7 % — ABNORMAL HIGH (ref 11.5–15.5)
WBC: 12.2 10*3/uL — AB (ref 4.0–10.5)

## 2016-07-06 LAB — BASIC METABOLIC PANEL
BUN: 22 mg/dL (ref 6–23)
CALCIUM: 9.7 mg/dL (ref 8.4–10.5)
CO2: 29 mEq/L (ref 19–32)
Chloride: 103 mEq/L (ref 96–112)
Creatinine, Ser: 1.28 mg/dL (ref 0.40–1.50)
GFR: 58.28 mL/min — AB (ref >60.00)
GLUCOSE: 98 mg/dL (ref 70–99)
POTASSIUM: 4.3 meq/L (ref 3.5–5.1)
SODIUM: 141 meq/L (ref 135–145)

## 2016-07-06 MED ORDER — IOPAMIDOL (ISOVUE-300) INJECTION 61%
100.0000 mL | Freq: Once | INTRAVENOUS | Status: AC | PRN
  Administered 2016-07-06: 100 mL via INTRAVENOUS

## 2016-07-06 MED ORDER — OXYCODONE HCL 20 MG PO TABS: 20.0000 mg | ORAL_TABLET | Freq: Four times a day (QID) | ORAL | 0 refills | Status: DC | PRN

## 2016-07-06 NOTE — Addendum Note (Signed)
Addended by: [name] A on: 07/06/2016 02:19 PM   Modules accepted: Orders

## 2016-07-06 NOTE — Progress Notes (Signed)
Pre visit review using our clinic review tool, if applicable. No additional management support is needed unless otherwise documented below in the visit note. 

## 2016-07-06 NOTE — Telephone Encounter (Signed)
Called from CT regarding result. PCP Dr. Sarajane Jews notified personally with result.
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
## 2016-07-09 ENCOUNTER — Telehealth: Payer: Self-pay | Admitting: Family Medicine

## 2016-07-09 NOTE — Telephone Encounter (Signed)
I went over lab results with pt.

## 2016-07-09 NOTE — Telephone Encounter (Signed)
I spoke with pt and went over results. 

## 2016-07-09 NOTE — Telephone Encounter (Signed)
Can you call pt to schedule a office visit to follow up on CT results? ( 15 minute visit okay )

## 2016-07-09 NOTE — Telephone Encounter (Signed)
Pt scheduled  

## 2016-07-09 NOTE — Telephone Encounter (Signed)
Pt would like results of ct scan . Please call back. Pt still in pain.

## 2016-07-12 ENCOUNTER — Encounter: Payer: Self-pay | Admitting: Family Medicine

## 2016-07-12 ENCOUNTER — Ambulatory Visit (INDEPENDENT_AMBULATORY_CARE_PROVIDER_SITE_OTHER): Payer: Medicare Other | Admitting: Family Medicine

## 2016-07-12 VITALS — BP 122/83 | HR 70 | Temp 97.9°F | Ht 70.0 in | Wt 227.0 lb

## 2016-07-12 DIAGNOSIS — R1031 Right lower quadrant pain: Secondary | ICD-10-CM | POA: Diagnosis not present

## 2016-07-12 DIAGNOSIS — N401 Enlarged prostate with lower urinary tract symptoms: Secondary | ICD-10-CM | POA: Diagnosis not present

## 2016-07-12 DIAGNOSIS — R972 Elevated prostate specific antigen [PSA]: Secondary | ICD-10-CM

## 2016-07-12 DIAGNOSIS — N138 Other obstructive and reflux uropathy: Secondary | ICD-10-CM

## 2016-07-12 LAB — PSA: PSA: 5.81 ng/mL — ABNORMAL HIGH (ref 0.10–4.00)

## 2016-07-12 NOTE — Progress Notes (Signed)
Pre visit review using our clinic review tool, if applicable. No additional management support is needed unless otherwise documented below in the visit note. 
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
## 2016-07-18 NOTE — Addendum Note (Signed)
Addended by: [name] A on: 07/18/2016 07:28 AM   Modules accepted: Orders

## 2016-07-19 ENCOUNTER — Ambulatory Visit
Admission: RE | Admit: 2016-07-19 | Discharge: 2016-07-19 | Disposition: A | Discharge status: Home / Self Care | Payer: Medicare Other | Source: Ambulatory Visit | Attending: Family Medicine | Admitting: Family Medicine

## 2016-07-19 DIAGNOSIS — K8020 Calculus of gallbladder without cholecystitis without obstruction: Secondary | ICD-10-CM | POA: Diagnosis not present

## 2016-07-19 DIAGNOSIS — R1031 Right lower quadrant pain: Secondary | ICD-10-CM

## 2016-07-20 NOTE — Addendum Note (Signed)
Addended by: [name] A on: 07/20/2016 01:08 PM   Modules accepted: Orders

## 2016-07-31 ENCOUNTER — Other Ambulatory Visit: Payer: Self-pay | Admitting: Surgery

## 2016-07-31 DIAGNOSIS — K8020 Calculus of gallbladder without cholecystitis without obstruction: Secondary | ICD-10-CM | POA: Diagnosis not present

## 2016-08-03 ENCOUNTER — Ambulatory Visit (INDEPENDENT_AMBULATORY_CARE_PROVIDER_SITE_OTHER): Payer: Medicare Other | Admitting: Physician Assistant

## 2016-08-03 ENCOUNTER — Encounter (INDEPENDENT_AMBULATORY_CARE_PROVIDER_SITE_OTHER): Payer: Self-pay

## 2016-08-03 ENCOUNTER — Encounter: Payer: Self-pay | Admitting: Physician Assistant

## 2016-08-03 VITALS — BP 138/78 | HR 77 | Ht 70.0 in | Wt 225.8 lb

## 2016-08-03 DIAGNOSIS — R001 Bradycardia, unspecified: Secondary | ICD-10-CM

## 2016-08-03 DIAGNOSIS — E7800 Pure hypercholesterolemia, unspecified: Secondary | ICD-10-CM

## 2016-08-03 DIAGNOSIS — Z01810 Encounter for preprocedural cardiovascular examination: Secondary | ICD-10-CM

## 2016-08-03 DIAGNOSIS — I5032 Chronic diastolic (congestive) heart failure: Secondary | ICD-10-CM

## 2016-08-03 DIAGNOSIS — I10 Essential (primary) hypertension: Secondary | ICD-10-CM | POA: Diagnosis not present

## 2016-08-03 DIAGNOSIS — I2510 Atherosclerotic heart disease of native coronary artery without angina pectoris: Secondary | ICD-10-CM

## 2016-08-03 NOTE — Patient Instructions (Addendum)
Medication Instructions:  TAKE LASIX DAILY FOR 2-3 DAYS THEN RESUME AS NEEDED  Labwork: NONE  Testing/Procedures: Your physician has requested that you have a lexiscan myoview. For further information please visit [email or web address]. Please follow instruction sheet, as given. PER [facility] PAC MYOVIEW TO BE DONE Monday OR Tuesday ; THIS IS FOR PRE OP  Follow-Up: DR. [name] IN 3 MONTHS  Any Other Special Instructions Will Be Listed Below (If Applicable).  If you need a refill on your cardiac medications before your next appointment, please call your pharmacy.
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
## 2016-08-06 ENCOUNTER — Telehealth (HOSPITAL_COMMUNITY): Payer: Self-pay | Admitting: *Deleted

## 2016-08-06 NOTE — Telephone Encounter (Signed)
Left message on voicemail per DPR in reference to upcoming appointment scheduled on 08/07/16 with detailed instructions given per Myocardial Perfusion Study Information Sheet for the test. LM to arrive 15 minutes early, and that it is imperative to arrive on time for appointment to keep from having the test rescheduled. If you need to cancel or reschedule your appointment, please call the office within 24 hours of your appointment. Failure to do so may result in a cancellation of your appointment, and a $50 no show fee. Phone number given for call back for any questions. Kirstie Peri

## 2016-08-07 ENCOUNTER — Encounter: Payer: Self-pay | Admitting: Physician Assistant

## 2016-08-07 ENCOUNTER — Ambulatory Visit (HOSPITAL_COMMUNITY): Payer: Medicare Other | Attending: Cardiology

## 2016-08-07 DIAGNOSIS — Z01810 Encounter for preprocedural cardiovascular examination: Secondary | ICD-10-CM

## 2016-08-07 LAB — MYOCARDIAL PERFUSION IMAGING
CHL CUP NUCLEAR SRS: 5
CHL CUP NUCLEAR SSS: 7
CSEPPHR: 83 {beats}/min
LV sys vol: 55 mL
LVDIAVOL: 97 mL (ref 62–150)
NUC STRESS TID: 1.03
RATE: 0.33
Rest HR: 72 {beats}/min
SDS: 2

## 2016-08-07 MED ORDER — REGADENOSON 0.4 MG/5ML IV SOLN
0.4000 mg | Freq: Once | INTRAVENOUS | Status: AC
  Administered 2016-08-07: 0.4 mg via INTRAVENOUS

## 2016-08-07 MED ORDER — TECHNETIUM TC 99M TETROFOSMIN IV KIT
31.8000 | PACK | Freq: Once | INTRAVENOUS | Status: AC | PRN
  Administered 2016-08-07: 31.8 via INTRAVENOUS
  Filled 2016-08-07: qty 32

## 2016-08-07 MED ORDER — TECHNETIUM TC 99M TETROFOSMIN IV KIT
10.5000 | PACK | Freq: Once | INTRAVENOUS | Status: AC | PRN
  Administered 2016-08-07: 10.5 via INTRAVENOUS
  Filled 2016-08-07: qty 11

## 2016-08-10 ENCOUNTER — Ambulatory Visit (HOSPITAL_COMMUNITY)
Admission: RE | Admit: 2016-08-10 | Discharge: 2016-08-10 | Disposition: A | Discharge status: Home / Self Care | Payer: Medicare Other | Source: Ambulatory Visit | Attending: Physician Assistant | Admitting: Physician Assistant

## 2016-08-10 ENCOUNTER — Other Ambulatory Visit (HOSPITAL_COMMUNITY): Payer: Medicare Other

## 2016-08-10 ENCOUNTER — Other Ambulatory Visit: Payer: Self-pay | Admitting: *Deleted

## 2016-08-10 DIAGNOSIS — R001 Bradycardia, unspecified: Secondary | ICD-10-CM | POA: Diagnosis not present

## 2016-08-10 DIAGNOSIS — E785 Hyperlipidemia, unspecified: Secondary | ICD-10-CM | POA: Diagnosis not present

## 2016-08-10 DIAGNOSIS — G4733 Obstructive sleep apnea (adult) (pediatric): Secondary | ICD-10-CM | POA: Insufficient documentation

## 2016-08-10 DIAGNOSIS — Z01818 Encounter for other preprocedural examination: Secondary | ICD-10-CM

## 2016-08-10 DIAGNOSIS — I10 Essential (primary) hypertension: Secondary | ICD-10-CM | POA: Diagnosis not present

## 2016-08-10 DIAGNOSIS — I2510 Atherosclerotic heart disease of native coronary artery without angina pectoris: Secondary | ICD-10-CM | POA: Diagnosis not present

## 2016-08-10 DIAGNOSIS — I252 Old myocardial infarction: Secondary | ICD-10-CM | POA: Insufficient documentation

## 2016-08-10 NOTE — Progress Notes (Signed)
  Echocardiogram 2D Echocardiogram Limited has been performed.  Darlina Sicilian M 08/10/2016, 1:36 PM

## 2016-08-12 ENCOUNTER — Encounter: Payer: Self-pay | Admitting: Physician Assistant

## 2016-08-13 ENCOUNTER — Encounter: Payer: Self-pay | Admitting: Physician Assistant

## 2016-08-13 ENCOUNTER — Telehealth: Payer: Self-pay | Admitting: Cardiovascular Disease

## 2016-08-13 NOTE — Telephone Encounter (Signed)
I spoke with pt and reviewed echo results with him.  Echo results forwarded to Dr. Sarajane Jews and Dr. Rush Farmer.  Stress test results forwarded to Dr. Rush Farmer.

## 2016-08-13 NOTE — Telephone Encounter (Signed)
Mr.Peral is returning a call about his test results .[name] Thanks

## 2016-08-16 ENCOUNTER — Telehealth: Payer: Self-pay | Admitting: Family Medicine

## 2016-08-16 NOTE — Telephone Encounter (Signed)
Pt request refill  °Oxycodone HCl 20 MG TABS °

## 2016-08-17 MED ORDER — OXYCODONE HCL 20 MG PO TABS
20.0000 mg | ORAL_TABLET | Freq: Four times a day (QID) | ORAL | 0 refills | Status: DC | PRN
Start: 2016-08-17 — End: 2016-10-04

## 2016-08-17 NOTE — Telephone Encounter (Signed)
Script is ready for pick up and I spoke with pt.  

## 2016-08-17 NOTE — Telephone Encounter (Signed)
done

## 2016-08-22 ENCOUNTER — Encounter (HOSPITAL_COMMUNITY)
Admission: RE | Admit: 2016-08-22 | Discharge: 2016-08-22 | Disposition: A | Discharge status: Home / Self Care | Payer: Medicare Other | Source: Ambulatory Visit | Attending: Surgery | Admitting: Surgery

## 2016-08-22 ENCOUNTER — Encounter (HOSPITAL_COMMUNITY): Payer: Self-pay

## 2016-08-22 DIAGNOSIS — Z7951 Long term (current) use of inhaled steroids: Secondary | ICD-10-CM | POA: Diagnosis not present

## 2016-08-22 DIAGNOSIS — G4730 Sleep apnea, unspecified: Secondary | ICD-10-CM | POA: Diagnosis not present

## 2016-08-22 DIAGNOSIS — K8010 Calculus of gallbladder with chronic cholecystitis without obstruction: Secondary | ICD-10-CM | POA: Diagnosis not present

## 2016-08-22 DIAGNOSIS — K649 Unspecified hemorrhoids: Secondary | ICD-10-CM | POA: Diagnosis not present

## 2016-08-22 DIAGNOSIS — Z86010 Personal history of colonic polyps: Secondary | ICD-10-CM | POA: Diagnosis not present

## 2016-08-22 DIAGNOSIS — Z87891 Personal history of nicotine dependence: Secondary | ICD-10-CM | POA: Diagnosis not present

## 2016-08-22 DIAGNOSIS — Z8249 Family history of ischemic heart disease and other diseases of the circulatory system: Secondary | ICD-10-CM | POA: Diagnosis not present

## 2016-08-22 DIAGNOSIS — E079 Disorder of thyroid, unspecified: Secondary | ICD-10-CM | POA: Diagnosis not present

## 2016-08-22 DIAGNOSIS — N400 Enlarged prostate without lower urinary tract symptoms: Secondary | ICD-10-CM | POA: Diagnosis not present

## 2016-08-22 DIAGNOSIS — Z8261 Family history of arthritis: Secondary | ICD-10-CM | POA: Diagnosis not present

## 2016-08-22 DIAGNOSIS — Z791 Long term (current) use of non-steroidal anti-inflammatories (NSAID): Secondary | ICD-10-CM | POA: Diagnosis not present

## 2016-08-22 DIAGNOSIS — Z9049 Acquired absence of other specified parts of digestive tract: Secondary | ICD-10-CM | POA: Diagnosis not present

## 2016-08-22 DIAGNOSIS — Z888 Allergy status to other drugs, medicaments and biological substances status: Secondary | ICD-10-CM | POA: Diagnosis not present

## 2016-08-22 DIAGNOSIS — Z79891 Long term (current) use of opiate analgesic: Secondary | ICD-10-CM | POA: Diagnosis not present

## 2016-08-22 DIAGNOSIS — Z79899 Other long term (current) drug therapy: Secondary | ICD-10-CM | POA: Diagnosis not present

## 2016-08-22 DIAGNOSIS — Z9889 Other specified postprocedural states: Secondary | ICD-10-CM | POA: Diagnosis not present

## 2016-08-22 DIAGNOSIS — I252 Old myocardial infarction: Secondary | ICD-10-CM | POA: Diagnosis not present

## 2016-08-22 DIAGNOSIS — K8020 Calculus of gallbladder without cholecystitis without obstruction: Secondary | ICD-10-CM | POA: Diagnosis present

## 2016-08-22 DIAGNOSIS — Z9852 Vasectomy status: Secondary | ICD-10-CM | POA: Diagnosis not present

## 2016-08-22 DIAGNOSIS — Z91013 Allergy to seafood: Secondary | ICD-10-CM | POA: Diagnosis not present

## 2016-08-22 HISTORY — DX: Personal history of other diseases of the digestive system: Z87.19

## 2016-08-22 HISTORY — DX: Unspecified osteoarthritis, unspecified site: M19.90

## 2016-08-22 LAB — BASIC METABOLIC PANEL
ANION GAP: 8 (ref 5–15)
BUN: 11 mg/dL (ref 6–20)
CO2: 25 mmol/L (ref 22–32)
Calcium: 9.5 mg/dL (ref 8.9–10.3)
Chloride: 106 mmol/L (ref 101–111)
Creatinine, Ser: 1.16 mg/dL (ref 0.61–1.24)
GFR calc non Af Amer: >60 mL/min (ref >60)
Glucose, Bld: 121 mg/dL — ABNORMAL HIGH (ref 65–99)
Potassium: 4.3 mmol/L (ref 3.5–5.1)
SODIUM: 139 mmol/L (ref 135–145)

## 2016-08-22 LAB — CBC
HCT: 49.8 % (ref 39.0–52.0)
HEMOGLOBIN: 16.3 g/dL (ref 13.0–17.0)
MCH: 31.3 pg (ref 26.0–34.0)
MCHC: 32.7 g/dL (ref 30.0–36.0)
MCV: 95.8 fL (ref 78.0–100.0)
Platelets: 249 10*3/uL (ref 150–400)
RBC: 5.2 MIL/uL (ref 4.22–5.81)
RDW: 14.4 % (ref 11.5–15.5)
WBC: 12 10*3/uL — AB (ref 4.0–10.5)

## 2016-08-22 NOTE — Pre-Procedure Instructions (Signed)
ADOM CABADAS  08/22/2016      [id number] - [place], [place] - [address] [place] [address] Phone: [phone] Fax: [phone]  [place], [place] [facility] [address] [place] [address] [place] [address] Phone: [phone] Fax: [phone]    Your procedure is scheduled on Feb1  Report to [facility] at 700 A.M.  Call this number if you have problems the morning of surgery:  [phone]   Remember:  Do not eat food or drink liquids after midnight.  Take these medicines the morning of surgery with A SIP OF WATER Clonazepam (Klonopin) if needed, Flonase spray if needed, Levothyroxine (Synthroid), Methocarbamol (Robaxin0 if needed, Nitrostat if needed, omeprazole (Prilosec), Oxycodone HCl if needed  Stop taking BC's, Goody's, Herbal medications Fish Oil, Ibuprofen, Advil, Motrin, Aleve  Take/Stop aspirin and plavix as directed by your Dr.   Lazaro Arms not wear jewelry, make-up or nail polish.  Do not wear lotions, powders, or perfumes, or deoderant.  Do not shave 48 hours prior to surgery.  Men may shave face and neck.  Do not bring valuables to the hospital.  [facility] is not responsible for any belongings or valuables.  Contacts, dentures or bridgework may not be worn into surgery.  Leave your suitcase in the car.  After surgery it may be brought to your room.  For patients admitted to the hospital, discharge time will be determined by your treatment team.  Patients discharged the day of surgery will not be allowed to drive home.    Special instructions:  [place] - Preparing for Surgery  Before surgery, you can play an important role.  Because skin is not sterile, your skin needs to be as free of germs as possible.  You can reduce the number of germs on you skin by washing with CHG (chlorahexidine gluconate) soap before surgery.  CHG  is an antiseptic cleaner which kills germs and bonds with the skin to continue killing germs even after washing.  Please DO NOT use if you have an allergy to CHG or antibacterial soaps.  If your skin becomes reddened/irritated stop using the CHG and inform your nurse when you arrive at Short Stay.  Do not shave (including legs and underarms) for at least 48 hours prior to the first CHG shower.  You may shave your face.  Please follow these instructions carefully:   1.  Shower with CHG Soap the night before surgery and the                                morning of Surgery.  2.  If you choose to wash your hair, wash your hair first as usual with your       normal shampoo.  3.  After you shampoo, rinse your hair and body thoroughly to remove the                      Shampoo.  4.  Use CHG as you would any other liquid soap.  You can apply chg directly       to the skin and wash gently with scrungie or a clean washcloth.  5.  Apply the CHG Soap to your body ONLY FROM THE NECK DOWN.  Do not use on open wounds or open sores.  Avoid contact with your eyes,       ears, mouth and genitals (private parts).  Wash genitals (private parts)       with your normal soap.  6.  Wash thoroughly, paying special attention to the area where your surgery        will be performed.  7.  Thoroughly rinse your body with warm water from the neck down.  8.  DO NOT shower/wash with your normal soap after using and rinsing off       the CHG Soap.  9.  Pat yourself dry with a clean towel.            10.  Wear clean pajamas.            11.  Place clean sheets on your bed the night of your first shower and do not        sleep with pets.  Day of Surgery  Do not apply any lotions/deoderants the morning of surgery.  Please wear clean clothes to the hospital/surgery center.     Please read over the following fact sheets that you were given. Pain Booklet and Surgical Site Infection Prevention

## 2016-08-22 NOTE — Progress Notes (Signed)
PCP is Dr. [name] Cardiologist is Dr. [name] Card cath noted from 11-26-14 Echo noted from 08-10-16 Stress test from 08-07-16 Reports last dose Plavix dose was 08-15-16 Last Asprin dose was 08-17-16

## 2016-08-22 NOTE — Progress Notes (Signed)
Anesthesia Chart Review:  Pt is a 76 year old male scheduled for laparoscopic cholecystectomy on 08/23/2016 with Coralie Keens, MD  - PCP is [name], MD  - Cardiologist is [name], MD, last office visit 08/03/16 with Richardson Dopp, PA who cleared pt for surgery based on echo and stress test results (see below).   - Pt saw EP cardiologist Will Camnitz, MD 12/29/15 for nocturnal bradycardia with HR as low as 22 and blocked p waves. Dr. Curt Bears suggested sleep study to evaluate for OSA (pt had OSA in his hx but was not compliant with CPAP), but pt did not follow through and get sleep study. Note by Dr. [name] 03/22/16 indicates pt declines sleep study, is without dizziness, no indication for pacemaker, EP follow up on hold per pt request.   PMH includes:  CAD (DES to RPDA and LAD 11/16/14), CHF, PACs, PVCs, AVM (s/p ablation 2011, 2017), HTN, bradycardia, hyperlipidemia, OSA (no CPAP), hypothyroidism, anemia. Former smoker. BMI 32.  Medications include: ASA, Plavix, zetia, iron, Lasix, levothyroxine, Prilosec. Pt to stop Plavix 5 days before surgery.  Preoperative labs reviewed.    CT chest 06/10/16:  - No acute cardiopulmonary disease. Dependent atelectasis in the lower lobes. - Coronary artery disease. - Stranding in the left retroperitoneum and along the left hemidiaphragm compatible with small retroperitoneal hematoma. - No evidence of solid organ injury. - Sigmoid diverticulosis.  Echo 08/10/16:  - Left ventricle: The cavity size was normal. Wall thickness was normal. Systolic function was normal. The estimated ejection fraction was in the range of 60% to 65%. Wall motion was normal; there were no regional wall motion abnormalities. Doppler parameters are consistent with abnormal left ventricular relaxation (grade 1 diastolic dysfunction). - Aortic valve: Mildly calcified annulus. Mildly thickened leaflets. - Mitral valve: Mildly calcified annulus.  Nuclear stress test  08/07/16:   Nuclear stress EF: 43%. The left ventricular ejection fraction is moderately decreased (30-44%).  This is an intermediate risk study based on the reduction of the LV function  There is no evidenc of ischemia or previous MI .  Carotid duplex 12/27/15: B ICA with 1- 39 percent stenosis  Holter monitor 12/08/15:  - Sinus rhythm with sinus bradycardia.  - There are pauses over 5 seconds with blocked p-waves.  - Frequent premature ventricular contractions with couplets and triplets.  - Frequent premature atrial contractions - He needs EP referral.  Cardiac cath 11/26/14:   Prox RCA lesion, 20% stenosed.  Mid RCA lesion, 40% stenosed.  Mid RCA to Dist RCA lesion, 30% stenosed.  Mid Cx lesion, 30% stenosed.  Ost LAD to Prox LAD lesion, 50% stenosed.  Dist LAD lesion, 25% stenosed.   1. Double vessel CAD with patent stents LAD and RCA 2. Moderate proximal LAD stenosis that was evaluated with a FFR 10 days ago and was not flow limiting, unchanged in appearance from cath in 2014 3. Normal LV systolic function 4. Possible dyspnea and chest pressure from Brilinta  If no changes, I anticipate pt can proceed with surgery as scheduled.   Willeen Cass, FNP-BC [facility] Stay [facility]/Anesthesiology Phone: [phone] 08/22/2016 1:04 PM

## 2016-08-22 NOTE — H&P (Signed)
Christopher BARRETTA 07/31/2016 4:11 PM Location: [facility] Patient #: [id number] DOB: [birth year] Married / Language: [name] / Race: White Male   History of Present Illness [name] A. Ninfa Linden MD; 07/31/2016 4:51 PM) Patient words: New-Gallbladder.  The patient is a 76 year old male who presents with symptomatic choledocholithiasis. This patient for better [name] for symptomatic cholelithiasis. He had the attack of sudden right-sided abdominal pain in mid December. He described a sharp pain hurting due to his back some nausea. He nearly passed out from this. He presented to the emergency department where he had a CAT scan ultrasound. This demonstrated cholelithiasis. His laboratory data was otherwise unremarkable. He is still having pain in his right upper quadrant which is intermittent but no nausea currently. He is otherwise without complaints. The pain is milder. It is worse with fatty meals.   Past Surgical History [name], CMA; 07/31/2016 4:14 PM) Appendectomy  Colon Polyp Removal - Colonoscopy  Colon Polyp Removal - Open  Foot Surgery  Left. Oral Surgery  Tonsillectomy  Vasectomy   Diagnostic Studies History [name], CMA; 07/31/2016 4:14 PM) Colonoscopy  1-5 years ago  Allergies [name], CMA; 07/31/2016 4:18 PM) Brilinta *HEMATOLOGICAL AGENTS - MISC.*  Metoprolol Succinate *BETA BLOCKERS*  Shellfish  Statins  Zolpidem Tartrate *HYPNOTICS/SEDATIVES/SLEEP DISORDER AGENTS*  Levaquin *FLUOROQUINOLONES*  TraZODone HCl *ANTIDEPRESSANTS*   Medication History ([name], CMA; 07/31/2016 4:21 PM) Ascorbic Acid (100MG  Tablet, Oral) Active. ClonazePAM (0.5MG  Tablet, Oral) Active. Diclofenac Sodium (1% Gel, Transdermal) Active. Zetia (10MG  Tablet, Oral) Active. Ferrous Sulfate (325 (65 Fe)MG Tablet, Oral) Active. Flonase (50MCG/ACT Suspension, Nasal) Active. Furosemide (40MG  Tablet, Oral) Active. Levothyroxine Sodium  (100MCG Tablet, Oral) Active. Methocarbamol (750MG  Tablet, Oral) Active. Nitroglycerin (0.4MG  Tab Sublingual, Sublingual) Active. Omeprazole (20MG  Capsule DR, Oral) Active. OxyCODONE HCl (20MG  Tablet, Oral) Active. MiraLax (Oral) Active. ROPINIRole HCl (5MG  Tablet, Oral) Active. Tetrahydrozoline HCl (0.05% Solution, Ophthalmic) Active. Medications Reconciled  Social History [name], CMA; 07/31/2016 4:14 PM) Alcohol use  Remotely quit alcohol use. Caffeine use  Tea. No drug use  Tobacco use  Former smoker.  Family History [name], CMA; 07/31/2016 4:14 PM) Arthritis  Sister. Cancer  Mother. Heart Disease  Father, Sister.  Other Problems [name], CMA; 07/31/2016 4:14 PM) Chest pain  Cholelithiasis  Enlarged Prostate  Myocardial infarction  Sleep Apnea  Thyroid Disease  Transfusion history     Review of Systems ([name] CMA; 07/31/2016 4:14 PM) General Present- Chills and Fatigue. Not Present- Appetite Loss, Fever, Night Sweats, Weight Gain and Weight Loss. Skin Not Present- Change in Wart/Mole, Dryness, Hives, Jaundice, New Lesions, Non-Healing Wounds, Rash and Ulcer. HEENT Present- Hearing Loss, Ringing in the Ears, Seasonal Allergies, Visual Disturbances and Wears glasses/contact lenses. Not Present- Earache, Hoarseness, Nose Bleed, Oral Ulcers, Sinus Pain, Sore Throat and Yellow Eyes. Breast Not Present- Breast Mass, Breast Pain, Nipple Discharge and Skin Changes. Cardiovascular Present- Chest Pain, Difficulty Breathing Lying Down and Swelling of Extremities. Not Present- Leg Cramps, Palpitations, Rapid Heart Rate and Shortness of Breath. Gastrointestinal Present- Abdominal Pain, Constipation and Hemorrhoids. Not Present- Bloating, Bloody Stool, Change in Bowel Habits, Chronic diarrhea, Difficulty Swallowing, Excessive gas, Gets full quickly at meals, Indigestion, Nausea, Rectal Pain and Vomiting. Male Genitourinary Present- Impotence  and Urgency. Not Present- Blood in Urine, Change in Urinary Stream, Frequency, Nocturia, Painful Urination and Urine Leakage. Musculoskeletal Present- Muscle Weakness. Not Present- Back Pain, Joint Pain, Joint Stiffness, Muscle Pain and Swelling of Extremities. Neurological Present- Weakness. Not Present- Decreased Memory,  Fainting, Headaches, Numbness, Seizures, Tingling, Tremor and Trouble walking. Psychiatric Present- Depression. Not Present- Anxiety, Bipolar, Change in Sleep Pattern, Fearful and Frequent crying. Endocrine Present- Cold Intolerance. Not Present- Excessive Hunger, Hair Changes, Heat Intolerance, Hot flashes and New Diabetes. Hematology Present- Blood Thinners. Not Present- Easy Bruising, Excessive bleeding, Gland problems, HIV and Persistent Infections.  Vitals ([name] CMA; 07/31/2016 4:15 PM) 07/31/2016 4:14 PM Weight: 223 lb Height: 70in Body Surface Area: 2.19 m Body Mass Index: 32 kg/m  Pulse: 65 (Regular)  BP: 112/80 (Sitting, Left Arm, Standard)       Physical Exam ([name] A. Ninfa Linden MD; 07/31/2016 4:51 PM) General Mental Status-Alert. General Appearance-Consistent with stated age. Hydration-Well hydrated. Voice-Normal.  Head and Neck Head-normocephalic, atraumatic with no lesions or palpable masses.  Eye Eyeball - Bilateral-Extraocular movements intact. Sclera/Conjunctiva - Bilateral-No scleral icterus.  Chest and Lung Exam Chest and lung exam reveals -quiet, even and easy respiratory effort with no use of accessory muscles and on auscultation, normal breath sounds, no adventitious sounds and normal vocal resonance. Inspection Chest Wall - Normal. Back - normal.  Cardiovascular Cardiovascular examination reveals -on palpation PMI is normal in location and amplitude, no palpable S3 or S4. Normal cardiac borders., normal heart sounds, regular rate and rhythm with no murmurs, carotid auscultation reveals no bruits and  normal pedal pulses bilaterally.  Abdomen Inspection Inspection of the abdomen reveals - No Hernias. Skin - Scar - no surgical scars. Palpation/Percussion Palpation and Percussion of the abdomen reveal - Soft, No Rebound tenderness, No Rigidity (guarding) and No hepatosplenomegaly. Tenderness - Right Upper Quadrant. Auscultation Auscultation of the abdomen reveals - Bowel sounds normal.  Neurologic - Did not examine.  Musculoskeletal - Did not examine.    Assessment & Plan (Yianna Tersigni A. Ninfa Linden MD; 07/31/2016 4:52 PM) SYMPTOMATIC CHOLELITHIASIS (K80.20) Impression: I suspect he must have had a stone in the neck of the gallbladder was then removed at the time of his initial attack. I do not believe he had a bile duct stone. I believe he does have symptomatic cholelithiasis and I suspect some mild chronic cholecystitis. I am recommending a laparoscopic cholecystectomy. I discussed this with him in detail. I discussed risks of surgery which includes but is not limited to bleeding, infection, bile duct injury, bile leak, injury to other structures, cardiopulmonary issues, the need to convert to an open procedure, postoperative recovery, etc. We will have to get cardiac clearance prior to surgery. Surgery will thus be scheduled

## 2016-08-23 ENCOUNTER — Ambulatory Visit (HOSPITAL_COMMUNITY): Payer: Medicare Other | Admitting: Anesthesiology

## 2016-08-23 ENCOUNTER — Encounter (HOSPITAL_COMMUNITY): Payer: Self-pay | Admitting: *Deleted

## 2016-08-23 ENCOUNTER — Ambulatory Visit (HOSPITAL_COMMUNITY): Payer: Medicare Other | Admitting: Emergency Medicine

## 2016-08-23 ENCOUNTER — Encounter (HOSPITAL_COMMUNITY)
Admission: RE | Disposition: A | Discharge status: Home / Self Care | Payer: Self-pay | Source: Ambulatory Visit | Attending: Surgery

## 2016-08-23 ENCOUNTER — Ambulatory Visit (HOSPITAL_COMMUNITY)
Admission: RE | Admit: 2016-08-23 | Discharge: 2016-08-23 | Disposition: A | Discharge status: Home / Self Care | Payer: Medicare Other | Source: Ambulatory Visit | Attending: Surgery | Admitting: Surgery

## 2016-08-23 DIAGNOSIS — Z888 Allergy status to other drugs, medicaments and biological substances status: Secondary | ICD-10-CM | POA: Diagnosis not present

## 2016-08-23 DIAGNOSIS — Z87891 Personal history of nicotine dependence: Secondary | ICD-10-CM | POA: Insufficient documentation

## 2016-08-23 DIAGNOSIS — Z9049 Acquired absence of other specified parts of digestive tract: Secondary | ICD-10-CM | POA: Diagnosis not present

## 2016-08-23 DIAGNOSIS — Z79899 Other long term (current) drug therapy: Secondary | ICD-10-CM | POA: Insufficient documentation

## 2016-08-23 DIAGNOSIS — Z791 Long term (current) use of non-steroidal anti-inflammatories (NSAID): Secondary | ICD-10-CM | POA: Insufficient documentation

## 2016-08-23 DIAGNOSIS — Z79891 Long term (current) use of opiate analgesic: Secondary | ICD-10-CM | POA: Insufficient documentation

## 2016-08-23 DIAGNOSIS — Z8261 Family history of arthritis: Secondary | ICD-10-CM | POA: Diagnosis not present

## 2016-08-23 DIAGNOSIS — N400 Enlarged prostate without lower urinary tract symptoms: Secondary | ICD-10-CM | POA: Diagnosis not present

## 2016-08-23 DIAGNOSIS — Z86010 Personal history of colonic polyps: Secondary | ICD-10-CM | POA: Insufficient documentation

## 2016-08-23 DIAGNOSIS — Z7951 Long term (current) use of inhaled steroids: Secondary | ICD-10-CM | POA: Diagnosis not present

## 2016-08-23 DIAGNOSIS — G4730 Sleep apnea, unspecified: Secondary | ICD-10-CM | POA: Insufficient documentation

## 2016-08-23 DIAGNOSIS — Z91013 Allergy to seafood: Secondary | ICD-10-CM | POA: Insufficient documentation

## 2016-08-23 DIAGNOSIS — K8020 Calculus of gallbladder without cholecystitis without obstruction: Secondary | ICD-10-CM | POA: Diagnosis not present

## 2016-08-23 DIAGNOSIS — K649 Unspecified hemorrhoids: Secondary | ICD-10-CM | POA: Insufficient documentation

## 2016-08-23 DIAGNOSIS — Z9852 Vasectomy status: Secondary | ICD-10-CM | POA: Insufficient documentation

## 2016-08-23 DIAGNOSIS — K8010 Calculus of gallbladder with chronic cholecystitis without obstruction: Secondary | ICD-10-CM | POA: Insufficient documentation

## 2016-08-23 DIAGNOSIS — I252 Old myocardial infarction: Secondary | ICD-10-CM | POA: Diagnosis not present

## 2016-08-23 DIAGNOSIS — G4733 Obstructive sleep apnea (adult) (pediatric): Secondary | ICD-10-CM | POA: Diagnosis not present

## 2016-08-23 DIAGNOSIS — Z9889 Other specified postprocedural states: Secondary | ICD-10-CM | POA: Diagnosis not present

## 2016-08-23 DIAGNOSIS — K219 Gastro-esophageal reflux disease without esophagitis: Secondary | ICD-10-CM | POA: Diagnosis not present

## 2016-08-23 DIAGNOSIS — E079 Disorder of thyroid, unspecified: Secondary | ICD-10-CM | POA: Diagnosis not present

## 2016-08-23 DIAGNOSIS — Z8249 Family history of ischemic heart disease and other diseases of the circulatory system: Secondary | ICD-10-CM | POA: Insufficient documentation

## 2016-08-23 DIAGNOSIS — I10 Essential (primary) hypertension: Secondary | ICD-10-CM | POA: Diagnosis not present

## 2016-08-23 HISTORY — PX: CHOLECYSTECTOMY: SHX55

## 2016-08-23 SURGERY — LAPAROSCOPIC CHOLECYSTECTOMY
Anesthesia: General | Site: Abdomen

## 2016-08-23 MED ORDER — SODIUM CHLORIDE 0.9 % IR SOLN
Status: DC | PRN
Start: 2016-08-23 — End: 2016-08-23
  Administered 2016-08-23: 1000 mL

## 2016-08-23 MED ORDER — FENTANYL CITRATE (PF) 100 MCG/2ML IJ SOLN
INTRAMUSCULAR | Status: DC | PRN
  Administered 2016-08-23 (×4): 50 ug via INTRAVENOUS

## 2016-08-23 MED ORDER — CHLORHEXIDINE GLUCONATE CLOTH 2 % EX PADS: 6.0000 | MEDICATED_PAD | Freq: Once | CUTANEOUS | Status: DC

## 2016-08-23 MED ORDER — ONDANSETRON HCL 4 MG/2ML IJ SOLN
INTRAMUSCULAR | Status: AC
  Filled 2016-08-23: qty 2

## 2016-08-23 MED ORDER — FENTANYL CITRATE (PF) 100 MCG/2ML IJ SOLN
INTRAMUSCULAR | Status: AC
  Administered 2016-08-23: 25 ug via INTRAVENOUS
  Filled 2016-08-23: qty 2

## 2016-08-23 MED ORDER — OXYCODONE HCL 5 MG PO TABS: 5.0000 mg | ORAL_TABLET | Freq: Once | ORAL | Status: DC | PRN

## 2016-08-23 MED ORDER — DEXAMETHASONE SODIUM PHOSPHATE 10 MG/ML IJ SOLN
INTRAMUSCULAR | Status: DC | PRN
  Administered 2016-08-23: 10 mg via INTRAVENOUS

## 2016-08-23 MED ORDER — CEFAZOLIN SODIUM-DEXTROSE 2-4 GM/100ML-% IV SOLN
2.0000 g | INTRAVENOUS | Status: AC
  Administered 2016-08-23: 2 g via INTRAVENOUS
  Filled 2016-08-23: qty 100

## 2016-08-23 MED ORDER — LIDOCAINE 2% (20 MG/ML) 5 ML SYRINGE
INTRAMUSCULAR | Status: AC
  Filled 2016-08-23: qty 5

## 2016-08-23 MED ORDER — FENTANYL CITRATE (PF) 100 MCG/2ML IJ SOLN
25.0000 ug | INTRAMUSCULAR | Status: DC | PRN
  Administered 2016-08-23 (×3): 25 ug via INTRAVENOUS

## 2016-08-23 MED ORDER — PROPOFOL 10 MG/ML IV BOLUS
INTRAVENOUS | Status: AC
  Filled 2016-08-23: qty 20

## 2016-08-23 MED ORDER — ONDANSETRON HCL 4 MG/2ML IJ SOLN: 4.0000 mg | Freq: Once | INTRAMUSCULAR | Status: DC | PRN

## 2016-08-23 MED ORDER — ROCURONIUM BROMIDE 50 MG/5ML IV SOSY
PREFILLED_SYRINGE | INTRAVENOUS | Status: AC
  Filled 2016-08-23: qty 5

## 2016-08-23 MED ORDER — SUGAMMADEX SODIUM 500 MG/5ML IV SOLN
INTRAVENOUS | Status: DC | PRN
  Administered 2016-08-23: 400 mg via INTRAVENOUS

## 2016-08-23 MED ORDER — DEXAMETHASONE SODIUM PHOSPHATE 10 MG/ML IJ SOLN
INTRAMUSCULAR | Status: AC
  Filled 2016-08-23: qty 1

## 2016-08-23 MED ORDER — PHENYLEPHRINE 40 MCG/ML (10ML) SYRINGE FOR IV PUSH (FOR BLOOD PRESSURE SUPPORT)
PREFILLED_SYRINGE | INTRAVENOUS | Status: AC
  Filled 2016-08-23: qty 10

## 2016-08-23 MED ORDER — LIDOCAINE HCL (CARDIAC) 20 MG/ML IV SOLN
INTRAVENOUS | Status: DC | PRN
  Administered 2016-08-23: 40 mg via INTRAVENOUS

## 2016-08-23 MED ORDER — ACETAMINOPHEN 10 MG/ML IV SOLN
1000.0000 mg | Freq: Once | INTRAVENOUS | Status: AC
  Administered 2016-08-23: 1000 mg via INTRAVENOUS

## 2016-08-23 MED ORDER — KETOROLAC TROMETHAMINE 30 MG/ML IJ SOLN
30.0000 mg | Freq: Once | INTRAMUSCULAR | Status: AC
  Administered 2016-08-23: 30 mg via INTRAVENOUS

## 2016-08-23 MED ORDER — ONDANSETRON HCL 4 MG/2ML IJ SOLN
INTRAMUSCULAR | Status: DC | PRN
  Administered 2016-08-23: 4 mg via INTRAVENOUS

## 2016-08-23 MED ORDER — 0.9 % SODIUM CHLORIDE (POUR BTL) OPTIME
TOPICAL | Status: DC | PRN
Start: 2016-08-23 — End: 2016-08-23
  Administered 2016-08-23: 1000 mL

## 2016-08-23 MED ORDER — ACETAMINOPHEN 10 MG/ML IV SOLN
INTRAVENOUS | Status: AC
  Administered 2016-08-23: 1000 mg via INTRAVENOUS
  Filled 2016-08-23: qty 100

## 2016-08-23 MED ORDER — BUPIVACAINE-EPINEPHRINE 0.5% -1:200000 IJ SOLN
INTRAMUSCULAR | Status: DC | PRN
  Administered 2016-08-23: 20 mL

## 2016-08-23 MED ORDER — FENTANYL CITRATE (PF) 100 MCG/2ML IJ SOLN
INTRAMUSCULAR | Status: AC
  Filled 2016-08-23: qty 4

## 2016-08-23 MED ORDER — OXYCODONE HCL 5 MG/5ML PO SOLN: 5.0000 mg | Freq: Once | ORAL | Status: DC | PRN

## 2016-08-23 MED ORDER — KETOROLAC TROMETHAMINE 30 MG/ML IJ SOLN
INTRAMUSCULAR | Status: AC
  Administered 2016-08-23: 30 mg via INTRAVENOUS
  Filled 2016-08-23: qty 1

## 2016-08-23 MED ORDER — OXYCODONE-ACETAMINOPHEN 5-325 MG PO TABS: 1.0000 | ORAL_TABLET | ORAL | 0 refills | Status: DC | PRN

## 2016-08-23 MED ORDER — LACTATED RINGERS IV SOLN
INTRAVENOUS | Status: DC
  Administered 2016-08-23 (×2): via INTRAVENOUS

## 2016-08-23 MED ORDER — PROPOFOL 10 MG/ML IV BOLUS
INTRAVENOUS | Status: DC | PRN
  Administered 2016-08-23: 170 mg via INTRAVENOUS

## 2016-08-23 MED ORDER — PHENYLEPHRINE HCL 10 MG/ML IJ SOLN
INTRAMUSCULAR | Status: DC | PRN
  Administered 2016-08-23: 120 ug via INTRAVENOUS
  Administered 2016-08-23 (×2): 80 ug via INTRAVENOUS

## 2016-08-23 MED ORDER — ROCURONIUM BROMIDE 100 MG/10ML IV SOLN
INTRAVENOUS | Status: DC | PRN
  Administered 2016-08-23: 50 mg via INTRAVENOUS

## 2016-08-23 SURGICAL SUPPLY — 37 items
ADH SKN CLS APL DERMABOND .7 (GAUZE/BANDAGES/DRESSINGS) ×1
APPLIER CLIP 5 13 M/L LIGAMAX5 (MISCELLANEOUS) ×2
APR CLP MED LRG 5 ANG JAW (MISCELLANEOUS) ×1
BAG SPEC RTRVL LRG 6X4 10 (ENDOMECHANICALS) ×1
CANISTER SUCTION 2500CC (MISCELLANEOUS) ×2 IMPLANT
CHLORAPREP W/TINT 26ML (MISCELLANEOUS) ×2 IMPLANT
CLIP APPLIE 5 13 M/L LIGAMAX5 (MISCELLANEOUS) ×1 IMPLANT
COVER SURGICAL LIGHT HANDLE (MISCELLANEOUS) ×2 IMPLANT
DERMABOND ADVANCED (GAUZE/BANDAGES/DRESSINGS) ×1
DERMABOND ADVANCED .7 DNX12 (GAUZE/BANDAGES/DRESSINGS) ×1 IMPLANT
ELECT REM PT RETURN 9FT ADLT (ELECTROSURGICAL) ×2
ELECTRODE REM PT RTRN 9FT ADLT (ELECTROSURGICAL) ×1 IMPLANT
GLOVE BIOGEL PI IND STRL 7.0 (GLOVE) IMPLANT
GLOVE BIOGEL PI INDICATOR 7.0 (GLOVE) ×3
GLOVE ECLIPSE 6.5 STRL STRAW (GLOVE) ×1 IMPLANT
GLOVE SURG SIGNA 7.5 PF LTX (GLOVE) ×2 IMPLANT
GLOVE SURG SS PI 7.0 STRL IVOR (GLOVE) ×1 IMPLANT
GOWN STRL REUS W/ TWL LRG LVL3 (GOWN DISPOSABLE) ×2 IMPLANT
GOWN STRL REUS W/ TWL XL LVL3 (GOWN DISPOSABLE) ×1 IMPLANT
GOWN STRL REUS W/TWL LRG LVL3 (GOWN DISPOSABLE) ×6
GOWN STRL REUS W/TWL XL LVL3 (GOWN DISPOSABLE) ×2
KIT BASIN OR (CUSTOM PROCEDURE TRAY) ×2 IMPLANT
KIT ROOM TURNOVER OR (KITS) ×2 IMPLANT
NS IRRIG 1000ML POUR BTL (IV SOLUTION) ×2 IMPLANT
PAD ARMBOARD 7.5X6 YLW CONV (MISCELLANEOUS) ×2 IMPLANT
POUCH SPECIMEN RETRIEVAL 10MM (ENDOMECHANICALS) ×2 IMPLANT
SCISSORS LAP 5X35 DISP (ENDOMECHANICALS) ×2 IMPLANT
SET IRRIG TUBING LAPAROSCOPIC (IRRIGATION / IRRIGATOR) ×2 IMPLANT
SLEEVE ENDOPATH XCEL 5M (ENDOMECHANICALS) ×4 IMPLANT
SPECIMEN JAR SMALL (MISCELLANEOUS) ×2 IMPLANT
SUT MNCRL AB 4-0 PS2 18 (SUTURE) ×3 IMPLANT
TOWEL OR 17X24 6PK STRL BLUE (TOWEL DISPOSABLE) ×2 IMPLANT
TOWEL OR 17X26 10 PK STRL BLUE (TOWEL DISPOSABLE) ×2 IMPLANT
TRAY LAPAROSCOPIC MC (CUSTOM PROCEDURE TRAY) ×2 IMPLANT
TROCAR XCEL BLUNT TIP 100MML (ENDOMECHANICALS) ×2 IMPLANT
TROCAR XCEL NON-BLD 5MMX100MML (ENDOMECHANICALS) ×2 IMPLANT
TUBING INSUFFLATION (TUBING) ×2 IMPLANT

## 2016-08-23 NOTE — Anesthesia Preprocedure Evaluation (Signed)
Anesthesia Evaluation  Patient identified by MRN, date of birth, ID band Patient awake    Reviewed: Allergy & Precautions, NPO status , Patient's Chart, lab work & pertinent test results  Airway Mallampati: II  TM Distance: >3 FB Neck ROM: Full    Dental  (+) Edentulous Upper, Edentulous Lower   Pulmonary former smoker,    breath sounds clear to auscultation       Cardiovascular hypertension,  Rhythm:Regular Rate:Normal     Neuro/Psych    GI/Hepatic   Endo/Other    Renal/GU      Musculoskeletal   Abdominal   Peds  Hematology   Anesthesia Other Findings   Reproductive/Obstetrics                             Anesthesia Physical Anesthesia Plan  ASA: III  Anesthesia Plan: General   Post-op Pain Management:    Induction: Intravenous  Airway Management Planned: Oral ETT  Additional Equipment:   Intra-op Plan:   Post-operative Plan: Extubation in OR  Informed Consent: I have reviewed the patients History and Physical, chart, labs and discussed the procedure including the risks, benefits and alternatives for the proposed anesthesia with the patient or authorized representative who has indicated his/her understanding and acceptance.     Plan Discussed with: CRNA and Anesthesiologist  Anesthesia Plan Comments:         Anesthesia Quick Evaluation

## 2016-08-23 NOTE — Op Note (Signed)

## 2016-08-23 NOTE — Anesthesia Postprocedure Evaluation (Addendum)
Anesthesia Post Note  Patient: [name]  Procedure(s) Performed: Procedure(s) (LRB): LAPAROSCOPIC CHOLECYSTECTOMY (N/A)  Patient location during evaluation: PACU Anesthesia Type: General Level of consciousness: awake Pain management: pain level controlled Vital Signs Assessment: post-procedure vital signs reviewed and stable Respiratory status: spontaneous breathing, nonlabored ventilation and respiratory function stable Postop Assessment: no headache Anesthetic complications: no       Last Vitals:  Vitals:   08/23/16 1200 08/23/16 1213  BP: 119/62 104/77  Pulse: 78 72  Resp: (!) 21 20  Temp: 36.7 C     Last Pain:  Vitals:   08/23/16 1200  TempSrc:   PainSc: 3                  Future Yeldell COKER

## 2016-08-23 NOTE — Discharge Instructions (Signed)
[facility]: POST OP INSTRUCTIONS Always review your discharge instruction sheet given to you by the facility where your surgery was performed. IF YOU HAVE DISABILITY OR FAMILY LEAVE FORMS, YOU MUST BRING THEM TO THE OFFICE FOR PROCESSING.   DO NOT GIVE THEM TO YOUR DOCTOR.  1. A prescription for pain medication may be given to you upon discharge.  Take your pain medication as prescribed, if needed.  If narcotic pain medicine is not needed, then you may take acetaminophen (Tylenol) or ibuprofen (Advil) as needed. 2. Take your usually prescribed medications unless otherwise directed. 3. If you need a refill on your pain medication, please contact your pharmacy.  They will contact our office to request authorization. Prescriptions will not be filled after 5pm or on week-ends. 4. You should follow a light diet the first few days after arrival home, such as soup and crackers, etc.  Be sure to include lots of fluids daily. 5. Most patients will experience some swelling and bruising in the area of the incisions.  Ice packs will help.  Swelling and bruising can take several days to resolve.  6. It is common to experience some constipation if taking pain medication after surgery.  Increasing fluid intake and taking a stool softener (such as Colace) will usually help or prevent this problem from occurring.  A mild laxative (Milk of Magnesia or Miralax) should be taken according to package instructions if there are no bowel movements after 48 hours. 7. Unless discharge instructions indicate otherwise, you may remove your bandages 24-48 hours after surgery, and you may shower at that time.  You may have steri-strips (small skin tapes) in place directly over the incision.  These strips should be left on the skin for 7-10 days.  If your surgeon used skin glue on the incision, you may shower in 24 hours.  The glue will flake off over the next 2-3 weeks.  Any sutures or  staples will be removed at the office during your follow-up visit. 8. ACTIVITIES:  You may resume regular (light) daily activities beginning the next day--such as daily self-care, walking, climbing stairs--gradually increasing activities as tolerated.  You may have sexual intercourse when it is comfortable.  Refrain from any heavy lifting or straining until approved by your doctor. a. You may drive when you are no longer taking prescription pain medication, you can comfortably wear a seatbelt, and you can safely maneuver your car and apply brakes. b. RETURN TO WORK:  __________________________________________________________ 9. You should see your doctor in the office for a follow-up appointment approximately 2-3 weeks after your surgery.  Make sure that you call for this appointment within a day or two after you arrive home to insure a convenient appointment time. 10. OTHER INSTRUCTIONS:no lifting more than 15 pounds for 2 weeks 11. Ok to shower tomorrow 12. Ice pack and ibuprofen also for pain __________________________________________________________________________________________________________________________ __________________________________________________________________________________________________________________________ WHEN TO CALL YOUR DOCTOR: 1. Fever over 101.0 2. Inability to urinate 3. Continued bleeding from incision. 4. Increased pain, redness, or drainage from the incision. 5. Increasing abdominal pain  The clinic staff is available to answer your questions during regular business hours.  Please dont hesitate to call and ask to speak to one of the nurses for clinical concerns.  If you have a medical emergency, go to the nearest emergency room or call 911.  A surgeon from [facility] is always on call at the hospital. [address], [place], [place], [place]  [address] ? [address], [place], [place]   [address] [phone] ? [phone] ? [phone]  [phone] Web site: [email or web address]

## 2016-08-23 NOTE — Transfer of Care (Signed)
Immediate Anesthesia Transfer of Care Note  Patient: [name]  Procedure(s) Performed: Procedure(s): LAPAROSCOPIC CHOLECYSTECTOMY (N/A)  Patient Location: PACU  Anesthesia Type:General  Level of Consciousness: awake, alert , oriented and patient cooperative  Airway & Oxygen Therapy: Patient Spontanous Breathing and Patient connected to nasal cannula oxygen  Post-op Assessment: Report given to RN and Post -op Vital signs reviewed and stable  Post vital signs: Reviewed and stable  Last Vitals:  Vitals:   08/23/16 0709  BP: 140/80  Pulse: 67  Resp: 20  Temp: 36.5 C    Last Pain:  Vitals:   08/23/16 0709  TempSrc: Oral         Complications: No apparent anesthesia complications

## 2016-08-23 NOTE — Interval H&P Note (Signed)
History and Physical Interval Note: no change in H and P  08/23/2016 7:02 AM  [name]  has presented today for surgery, with the diagnosis of Symptomatic cholelithiasis  The various methods of treatment have been discussed with the patient and family. After consideration of risks, benefits and other options for treatment, the patient has consented to  Procedure(s): LAPAROSCOPIC CHOLECYSTECTOMY (N/A) as a surgical intervention .  The patient's history has been reviewed, patient examined, no change in status, stable for surgery.  I have reviewed the patient's chart and labs.  Questions were answered to the patient's satisfaction.     [name] A

## 2016-08-24 ENCOUNTER — Encounter (HOSPITAL_COMMUNITY): Payer: Self-pay | Admitting: Surgery

## 2016-08-24 ENCOUNTER — Other Ambulatory Visit: Payer: Self-pay | Admitting: Cardiovascular Disease

## 2016-09-04 ENCOUNTER — Telehealth: Payer: Self-pay | Admitting: Family Medicine

## 2016-09-04 DIAGNOSIS — R972 Elevated prostate specific antigen [PSA]: Secondary | ICD-10-CM | POA: Diagnosis not present

## 2016-09-04 MED ORDER — TRIAMCINOLONE ACETONIDE 0.1 % EX CREA
1.0000 | TOPICAL_CREAM | Freq: Two times a day (BID) | CUTANEOUS | 2 refills | Status: DC | PRN
Start: 2016-09-04 — End: 2016-12-18

## 2016-09-04 NOTE — Telephone Encounter (Signed)
Patient stated the antibiotic Dr Sarajane Jews prescribed for the redness on his ankles isn't helping.  He said it itches really bad to the point he could dig at it.  He applied Cortizone and it made it worse.  Patient requests a call back.

## 2016-09-04 NOTE — Telephone Encounter (Signed)
I called the pt and informed him of the message below and he is aware the Rx was sent to his pharmacy. 

## 2016-09-04 NOTE — Telephone Encounter (Signed)
Try Triamcinolone 0.1 % cream to apply bid prn. Call in 45 gram tube with 2 rf

## 2016-10-04 ENCOUNTER — Telehealth: Payer: Self-pay | Admitting: Family Medicine

## 2016-10-04 MED ORDER — OXYCODONE HCL 20 MG PO TABS: 20.0000 mg | ORAL_TABLET | Freq: Four times a day (QID) | ORAL | 0 refills | Status: DC | PRN

## 2016-10-04 NOTE — Telephone Encounter (Signed)
Script is ready for pick up here at front office, tried to reach pt and no answer.  

## 2016-10-04 NOTE — Telephone Encounter (Signed)
Pt need new Rx for Oxycodone  Pt is aware of 3 business days for refills

## 2016-10-04 NOTE — Telephone Encounter (Signed)
done

## 2016-10-31 ENCOUNTER — Telehealth: Payer: Self-pay | Admitting: Family Medicine

## 2016-10-31 NOTE — Telephone Encounter (Signed)
Stop the Ropinarole. Try Mirapex 1 mg to take qhs, call in #30 with 2 rf

## 2016-10-31 NOTE — Telephone Encounter (Signed)
The patient was wanted to know if there is anything else besides the ropinirole (REQUIP) 5 MG tablet that he can take for his restless leg syndrome. He is having to take more of them because he thinks he is getting immune to them.

## 2016-11-01 MED ORDER — PRAMIPEXOLE DIHYDROCHLORIDE 1 MG PO TABS: 1.0000 mg | ORAL_TABLET | Freq: Every day | ORAL | 2 refills | Status: DC

## 2016-11-01 NOTE — Telephone Encounter (Signed)
I spoke with pt, sent new script e-scribe to [facility] and removed Requip from current medication list.

## 2016-11-09 ENCOUNTER — Encounter: Payer: Self-pay | Admitting: Cardiovascular Disease

## 2016-11-26 ENCOUNTER — Telehealth: Payer: Self-pay | Admitting: Family Medicine

## 2016-11-26 NOTE — Telephone Encounter (Signed)
Pt request refill  Oxycodone HCl 20 MG TABS

## 2016-11-27 MED ORDER — OXYCODONE HCL 20 MG PO TABS: 20.0000 mg | ORAL_TABLET | Freq: Four times a day (QID) | ORAL | 0 refills | Status: DC | PRN

## 2016-11-27 NOTE — Telephone Encounter (Signed)
Script is ready for pick up here at front office and I left a voice message with this information.  

## 2016-11-27 NOTE — Telephone Encounter (Signed)
done

## 2016-12-03 ENCOUNTER — Encounter: Payer: Self-pay | Admitting: Cardiovascular Disease

## 2016-12-03 ENCOUNTER — Ambulatory Visit (INDEPENDENT_AMBULATORY_CARE_PROVIDER_SITE_OTHER): Payer: Medicare Other | Admitting: Cardiovascular Disease

## 2016-12-03 ENCOUNTER — Encounter (INDEPENDENT_AMBULATORY_CARE_PROVIDER_SITE_OTHER): Payer: Self-pay

## 2016-12-03 VITALS — BP 126/80 | HR 51 | Ht 70.0 in | Wt 233.4 lb

## 2016-12-03 DIAGNOSIS — E7800 Pure hypercholesterolemia, unspecified: Secondary | ICD-10-CM | POA: Diagnosis not present

## 2016-12-03 DIAGNOSIS — I5033 Acute on chronic diastolic (congestive) heart failure: Secondary | ICD-10-CM

## 2016-12-03 DIAGNOSIS — I2510 Atherosclerotic heart disease of native coronary artery without angina pectoris: Secondary | ICD-10-CM | POA: Diagnosis not present

## 2016-12-03 DIAGNOSIS — R001 Bradycardia, unspecified: Secondary | ICD-10-CM

## 2016-12-03 MED ORDER — FUROSEMIDE 20 MG PO TABS: 20.0000 mg | ORAL_TABLET | Freq: Every day | ORAL | 11 refills | Status: DC

## 2016-12-03 NOTE — Progress Notes (Signed)
Chief Complaint  Patient presents with  . Follow-up  CAD   History of Present Illness: 76 yo male with history of CAD, HTN, HLD, sleep apnea here today for follow up. Cardiac caths in 2009 and 2014 with moderate non-obstructive CAD. He has not tolerated statins. He was admitted to [facility] April 2016 with unstable angina. Cardiac cath with severe disease in the LAD and RCA, both treated with drug eluting stents. Repeat cath May 2016 due to dyspnea with stable disease. Symptoms resolved off of Brilinta. Cardiac monitor June 2016 with PACs, PVCs. He has had severe anemia due to angiodysplasia. Cardiac monitor May 2017 with nocturnal bradycardia with rates as low as 22 bpm with blocked PACs. Echo June 2017 with normal LV systolic function, grade 1 diastolic dysfunction, moderate LVH. He was seen in the EP clinic by Dr. Lennie Odor 12/30/15 and sleep study was arranged but he did not keep this appt. He did not tolerate Norvasc due to LE edema and did not tolerate beta blockers due to bradycardia.   He is here today for follow up. The patient denies any chest pain, dyspnea, palpitations, lower extremity edema, orthopnea, PND. He has had several episodes of dizziness where he loses his balance. He has had no syncope.      Primary Care Physician: [name], MD   Past Medical History:  Diagnosis Date  . Adenomatous polyp of colon 2007  . Allergy   . Anemia   . Arthritis   . AVM (arteriovenous malformation) 2011   a. S/p argon plasma coagulation and ablation in 2011, 2017.  . Back pain    lower back pain."inflammed disc" pain meds for this  . Bradycardia    a. H/o almost 7sec pause nocturnally during 2011 admission. Also has h/o fatigue with BB.  . Bronchitis   . CAD (coronary artery disease)    a. Nonobst disease by cath 2009. b. s/p PCI 10/2014 with DES to [place], patent by relook 11/2014 (Brilinta changed to Plavix with improved sx).  . Chronic diastolic CHF (congestive heart failure)  ([place])   . Depression   . Diverticulosis   . Gastritis 2011  . GERD (gastroesophageal reflux disease)   . Headache(784.0)   . History of echocardiogram    Echo 1/18: EF 60-65, no RWMA, Gr 1 DD, mild MAC  . History of hiatal hernia   . History of nuclear stress test    Myoview 1/18: EF 43, no ischemia or infarct; Intermediate Risk due to low EF  . Hyperlipidemia   . Hypertension   . Hypothyroidism   . Myocardial infarction ([place])   . Obstructive sleep apnea    -no cpap use  . Pneumonia   . Premature atrial contractions Holter 2016  . PVC's (premature ventricular contractions) Holter 2016  . Restless leg syndrome   . Statin intolerance   . Transfusion history    several years ago -GI bleed    Past Surgical History:  Procedure Laterality Date  . ANKLE SURGERY Left   . APPENDECTOMY    . CARDIAC CATHETERIZATION N/A 11/26/2014   Procedure: Left Heart Cath and Coronary Angiography;  Surgeon: Burnell Blanks, MD;  Location: [facility];  Service: Cardiovascular;  Laterality: N/A;  . CHOLECYSTECTOMY N/A 08/23/2016   Procedure: LAPAROSCOPIC CHOLECYSTECTOMY;  Surgeon: Coralie Keens, MD;  Location: [facility];  Service: General;  Laterality: N/A;  . COLONOSCOPY  08-23-05   per Dr. Deatra Ina, adenomatous polyps, repeat in 5  yrs   . COLONOSCOPY WITH PROPOFOL N/A 12/06/2015   Procedure: COLONOSCOPY WITH PROPOFOL;  Surgeon: Doran Stabler, MD;  Location: [facility];  Service: Gastroenterology;  Laterality: N/A;  . ENTEROSCOPY N/A 12/06/2015   Procedure: ENTEROSCOPY;  Surgeon: Doran Stabler, MD;  Location: [facility];  Service: Gastroenterology;  Laterality: N/A;  . ESOPHAGOGASTRODUODENOSCOPY  08-23-05   per Dr. Deatra Ina, cauterized jejunal AVMs   . HERNIA REPAIR    . HOT HEMOSTASIS N/A 12/06/2015   Procedure: HOT HEMOSTASIS (ARGON PLASMA COAGULATION/BICAP);  Surgeon: Doran Stabler, MD;  Location: [facility];  Service: Gastroenterology;  Laterality: N/A;  . LEFT HEART  CATHETERIZATION WITH CORONARY ANGIOGRAM N/A 09/08/2012   Procedure: LEFT HEART CATHETERIZATION WITH CORONARY ANGIOGRAM;  Surgeon: Burnell Blanks, MD;  Location: [facility] C[facility] LAB;  Service: Cardiovascular;  Laterality: N/A;  . LEFT HEART CATHETERIZATION WITH CORONARY ANGIOGRAM N/A 11/16/2014   Procedure: LEFT HEART CATHETERIZATION WITH CORONARY ANGIOGRAM;  Surgeon: Leonie Man, MD;  Location: [facility] C[facility] LAB;  Service: Cardiovascular;  Laterality: N/A;  . SKIN GRAFT Right    leg  . TONSILLECTOMY      Current Outpatient Prescriptions  Medication Sig Dispense Refill  . Ascorbic Acid (VITAMIN C PO) Take 4 tablets by mouth daily as needed (flu like symptopms).     [name] aspirin EC 81 MG tablet Take 81 mg by mouth daily.    . clonazePAM (KLONOPIN) 0.5 MG tablet TAKE 1 TABLET BY MOUTH 3 TIMES A DAY AS NEEDED (Patient taking differently: TAKE 1 TABLET BY MOUTH 3 TIMES A DAY AS NEEDED FOR ANXIETY) 270 tablet 1  . clopidogrel (PLAVIX) 75 MG tablet Take 1 tablet (75 mg total) by mouth daily. 30 tablet 11  . diclofenac sodium (VOLTAREN) 1 % GEL Apply 1 application topically daily as needed (ankle pain). 5 Tube 10  . ezetimibe (ZETIA) 10 MG tablet Take 1 tablet (10 mg total) by mouth daily. 30 tablet 11  . fluticasone (FLONASE) 50 MCG/ACT nasal spray Place 1 spray into both nostrils daily as needed for allergies or rhinitis.     . furosemide (LASIX) 40 MG tablet Take 40 mg by mouth daily as needed for fluid or edema.    [name] levothyroxine (SYNTHROID, LEVOTHROID) 100 MCG tablet TAKE 1 TABLET BY MOUTH EVERY DAY BEFORE BREAKFAST 30 tablet 11  . methocarbamol (ROBAXIN) 750 MG tablet Take 750 mg by mouth 4 (four) times daily as needed for muscle spasms.     . nitroGLYCERIN (NITROSTAT) 0.4 MG SL tablet PLACE 1 TABLET UNDER TONGUE EVERY 5 MINUTES AS NEEDED FOR CHEST PAIN 75 tablet 3  . omeprazole (PRILOSEC) 20 MG capsule Take 1 capsule (20 mg total) by mouth daily. 90 capsule 1  . Oxycodone HCl 20 MG TABS Take 1 tablet  (20 mg total) by mouth every 6 (six) hours as needed (severe pain). 120 tablet 0  . polyethylene glycol (MIRALAX / GLYCOLAX) packet Take 17 g by mouth daily as needed for mild constipation.    . ropinirole (REQUIP) 5 MG tablet Take 10 mg by mouth at bedtime.  5  . Tetrahydrozoline HCl (MURINE FOR RED EYES OP) Place 1 drop into both eyes daily as needed (red eyes).    . triamcinolone cream (KENALOG) 0.1 % Apply 1 application topically 2 (two) times daily as needed. 45 g 2   No current facility-administered medications for this visit.     Allergies  Allergen Reactions  . Brilinta [Ticagrelor] Shortness Of Breath  .  Metoprolol Tartrate Other (See Comments)    Sever chest pains " flat lined patient"  . Shellfish Allergy Anaphylaxis and Hives  . Statins Other (See Comments)    All statins cause myalgias   . Zolpidem Other (See Comments)    Chest pain  . Levaquin [Levofloxacin] Hives  . Trazodone And Nefazodone Other (See Comments)    Unsteady on feet    Social History   Social History  . Marital status: Married    Spouse name: N/A  . Number of children: 1  . Years of education: N/A   Occupational History  . Disabled Unemployed   Social History Main Topics  . Smoking status: Former Smoker    Packs/day: 2.00    Years: 50.00    Types: Cigarettes    Quit date: 07/23/2002  . Smokeless tobacco: Never Used  . Alcohol use No  . Drug use: No  . Sexual activity: Not on file   Other Topics Concern  . Not on file   Social History Narrative  . No narrative on file    Family History  Problem Relation Age of Onset  . Leukemia Mother   . Heart disease Father   . Stroke Father   . Heart attack Father   . Alcoholism Paternal Uncle   . Alcoholism Maternal Grandfather   . Colon cancer Neg Hx   . Esophageal cancer Neg Hx     Review of Systems:  As stated in the HPI and otherwise negative.   BP 126/80   Pulse (!) 51   Ht _0  (1.778 m)   Wt 233 lb 6.4 oz (105.9 kg)   SpO2  97%   BMI 33.49 kg/m   Physical Examination:  General: Well developed, well nourished, NAD  HEENT: OP clear, mucus membranes moist  SKIN: warm, dry. No rashes. Neuro: No focal deficits  Musculoskeletal: Muscle strength 5/5 all ext  Psychiatric: Mood and affect normal  Neck: No JVD, no carotid bruits, no thyromegaly, no lymphadenopathy.  Lungs:Clear bilaterally, no wheezes, rhonci, crackles Cardiovascular: Regular rate and rhythm. No murmurs, gallops or rubs. Abdomen:Soft. Bowel sounds present. Non-tender.  Extremities: No lower extremity edema. Pulses are 2 + in the bilateral DP/PT.  Echo June 2017: Left ventricle: The cavity size was normal. There was moderate   concentric hypertrophy. Systolic function was vigorous. The   estimated ejection fraction was in the range of 65% to 70%. Wall   motion was normal; there were no regional wall motion   abnormalities. There was an increased relative contribution of   atrial contraction to ventricular filling. Doppler parameters are   consistent with abnormal left ventricular relaxation (grade 1   diastolic dysfunction). - Aortic valve: Moderate diffuse thickening and calcification.  Cardiac cath 11/26/14: Left Anterior Descending   . Ost LAD to Prox LAD lesion, 50% stenosed. discrete . The lesion was not previously treated.   . Prox LAD to Mid LAD lesion, 0% stenosed. Previously placed Prox LAD to Mid LAD stent (unknown type) is patent.   Jorene Minors LAD lesion, 25% stenosed.      Left Circumflex   . Mid Cx lesion, 30% stenosed.     Right Coronary Artery   . Prox RCA lesion, 20% stenosed.   . Mid RCA lesion, 40% stenosed.   . Mid RCA to Dist RCA lesion, 30% stenosed.   . Right Posterior Descending Artery   . RPDA lesion, 0% stenosed. Previously placed RPDA drug eluting stent is patent.  EKG:  EKG is not ordered today. The ekg ordered today demonstrates   Recent Labs: 12/26/2015: TSH 23.568 07/06/2016: ALT 19 08/22/2016: BUN 11;  Creatinine, Ser 1.16; Hemoglobin 16.3; Platelets 249; Potassium 4.3; Sodium 139   Lipid Panel    Component Value Date/Time   CHOL 184 03/22/2016 0935   TRIG 230 (H) 03/22/2016 0935   HDL 40 03/22/2016 0935   CHOLHDL 4.6 03/22/2016 0935   VLDL 46 (H) 03/22/2016 0935   LDLCALC 98 03/22/2016 0935     Wt Readings from Last 3 Encounters:  12/03/16 233 lb 6.4 oz (105.9 kg)  08/23/16 225 lb (102.1 kg)  08/22/16 225 lb (102.1 kg)     Other studies Reviewed: Additional studies/ records that were reviewed today include: . Review of the above records demonstrates:    Assessment and Plan:   1. CAD without angina: No chest pain suggestive of angina. No beta blocker due to bradycardia. Continue ASA and Plavix.  He does not tolerate statins.   2. Chronic diastolic CHF: Weight is up several pounds. He has some LE edema and dyspnea. Will have him take Lasix 20 mg daily for one week and follow weight.      3. Hyperlipidemia: He is intolerant of statins. He has been referred to the lipid clinic but has chosen not to advance therapy since his LDL is under 100.    4. Nocturnal bradycardia: He has been evaluated by EP. Sleep study recommended but he refused. He does no wish to see EP again.  He is now having some daytime dizziness. No syncope. Will arrange a 2 week cardiac monitor.   Current medicines are reviewed at length with the patient today.  The patient does not have concerns regarding medicines.  The following changes have been made:  no change  Labs/ tests ordered today include:   No orders of the defined types were placed in this encounter.  Disposition:   FU with me in 12  months   Signed, [name], MD 12/03/2016 9:57 AM    [facility] [address], [place], [place]  [address] Phone: [phone]; Fax: [phone]

## 2016-12-03 NOTE — Patient Instructions (Signed)
Medication Instructions:  Your physician has recommended you make the following change in your medication:  Start furosemide 20 mg by mouth daily   Labwork: none  Testing/Procedures: Your physician has recommended that you wear an event monitor. Event monitors are medical devices that record the heart's electrical activity. Doctors most often [place] these monitors to diagnose arrhythmias. Arrhythmias are problems with the speed or rhythm of the heartbeat. The monitor is a small, portable device. You can wear one while you do your normal daily activities. This is usually used to diagnose what is causing palpitations/syncope (passing out).--2 week event monitor    Follow-Up: Your physician recommends that you schedule a follow-up appointment in: 6 months.  Please call our office in about 3 months to schedule this appointment.     Any Other Special Instructions Will Be Listed Below (If Applicable).     If you need a refill on your cardiac medications before your next appointment, please call your pharmacy.

## 2016-12-07 ENCOUNTER — Telehealth: Payer: Self-pay | Admitting: Family Medicine

## 2016-12-07 MED ORDER — FLUTICASONE PROPIONATE 50 MCG/ACT NA SUSP: 1.0000 | Freq: Every day | NASAL | 4 refills | Status: DC | PRN

## 2016-12-07 NOTE — Telephone Encounter (Signed)
Pt would like rx for Flonase. Per chart there is no recent rx for this.  Dr. Sarajane Jews - Please advise. Thanks!

## 2016-12-07 NOTE — Telephone Encounter (Signed)
Flonase is now OTC LMTCB

## 2016-12-07 NOTE — Telephone Encounter (Signed)
Spoke with pt and advised of refill. Rx sent to mail order per pt request. Nothing further needed at this time.

## 2016-12-07 NOTE — Telephone Encounter (Signed)
Pt needs a rx to save money

## 2016-12-07 NOTE — Telephone Encounter (Signed)
Patient is requesting a refill for Flonase spray.  Pharmacy: [facility]

## 2016-12-07 NOTE — Telephone Encounter (Signed)
Call in Flonase to use 2 sprays each nostril daily, 16 gm with 11 rf

## 2016-12-12 ENCOUNTER — Ambulatory Visit (INDEPENDENT_AMBULATORY_CARE_PROVIDER_SITE_OTHER): Payer: Medicare Other | Admitting: Family Medicine

## 2016-12-12 ENCOUNTER — Ambulatory Visit (INDEPENDENT_AMBULATORY_CARE_PROVIDER_SITE_OTHER): Payer: Medicare Other

## 2016-12-12 ENCOUNTER — Encounter: Payer: Self-pay | Admitting: Family Medicine

## 2016-12-12 VITALS — BP 136/83 | HR 78 | Temp 98.3°F | Ht 70.0 in | Wt 236.0 lb

## 2016-12-12 DIAGNOSIS — R001 Bradycardia, unspecified: Secondary | ICD-10-CM

## 2016-12-12 DIAGNOSIS — J209 Acute bronchitis, unspecified: Secondary | ICD-10-CM | POA: Diagnosis not present

## 2016-12-12 MED ORDER — AMOXICILLIN-POT CLAVULANATE 875-125 MG PO TABS: 1.0000 | ORAL_TABLET | Freq: Two times a day (BID) | ORAL | 0 refills | Status: DC

## 2016-12-12 MED ORDER — HYDROCODONE-HOMATROPINE 5-1.5 MG/5ML PO SYRP: 5.0000 mL | ORAL_SOLUTION | ORAL | 0 refills | Status: DC | PRN

## 2016-12-12 NOTE — Patient Instructions (Signed)
WE NOW OFFER   [facility] FAST TRACK!!!  SAME DAY Appointments for ACUTE CARE  Such as: Sprains, Injuries, cuts, abrasions, rashes, muscle pain, joint pain, back pain Colds, flu, sore throats, headache, allergies, cough, fever  Ear pain, sinus and eye infections Abdominal pain, nausea, vomiting, diarrhea, upset stomach Animal/insect bites  3 Easy Ways to Schedule: Walk-In Scheduling Call in scheduling Mychart Sign-up: [email or web address]         

## 2016-12-12 NOTE — Progress Notes (Signed)
   Subjective:    Patient ID: [name], male    DOB: Oct 18, 1940, 76 y.o.   MRN: [id number]  HPI Here for 3 days of chest tightness and coughing up green sputum. No fever. Using Tussin syrup.    Review of Systems  Constitutional: Negative.   HENT: Negative.   Eyes: Negative.   Respiratory: Positive for cough, chest tightness, shortness of breath and wheezing.   Cardiovascular: Negative.        Objective:   Physical Exam  Constitutional: He appears well-developed and well-nourished.  Neck: No thyromegaly present.  Cardiovascular: Normal rate, regular rhythm, normal heart sounds and intact distal pulses.   Pulmonary/Chest: Effort normal. No respiratory distress. He has no wheezes. He has no rales.  Scattered rhonchi   Lymphadenopathy:    He has no cervical adenopathy.          Assessment & Plan:  Bronchitis, treat with Augmentin. [name], MD

## 2016-12-13 ENCOUNTER — Encounter: Payer: Self-pay | Admitting: Cardiovascular Disease

## 2016-12-13 ENCOUNTER — Telehealth: Payer: Self-pay | Admitting: Cardiology

## 2016-12-13 ENCOUNTER — Ambulatory Visit (INDEPENDENT_AMBULATORY_CARE_PROVIDER_SITE_OTHER): Payer: Medicare Other | Admitting: Cardiovascular Disease

## 2016-12-13 VITALS — BP 130/70 | HR 75 | Ht 70.0 in | Wt 236.8 lb

## 2016-12-13 DIAGNOSIS — I4892 Unspecified atrial flutter: Secondary | ICD-10-CM

## 2016-12-13 DIAGNOSIS — I2510 Atherosclerotic heart disease of native coronary artery without angina pectoris: Secondary | ICD-10-CM | POA: Diagnosis not present

## 2016-12-13 DIAGNOSIS — I5032 Chronic diastolic (congestive) heart failure: Secondary | ICD-10-CM

## 2016-12-13 MED ORDER — RIVAROXABAN 20 MG PO TABS: 20.0000 mg | ORAL_TABLET | Freq: Every day | ORAL | 6 refills | Status: DC

## 2016-12-13 NOTE — Telephone Encounter (Signed)
Dr. [name] reviewed strips and would like to see pt this afternoon.  I spoke with pt and scheduled appt for today at 2:20

## 2016-12-13 NOTE — Patient Instructions (Addendum)
Medication Instructions:  Your physician has recommended you make the following change in your medication:  Start Xarelto 20 mg by mouth daily with dinner.  Stop Clopidogrel.    Labwork: Lab work to be done today--BMP, CBC  Testing/Procedures: None--Continue to wear monitor  Follow-Up: Your physician recommends that you schedule a follow-up appointment in: about 6 weeks. --Scheduled for July 12,2018 at 8:40    Any Other Special Instructions Will Be Listed Below (If Applicable).     If you need a refill on your cardiac medications before your next appointment, please call your pharmacy.

## 2016-12-13 NOTE — Telephone Encounter (Signed)
Can we get a copy of his monitor to review? chris

## 2016-12-13 NOTE — Telephone Encounter (Signed)
Manny from [employer] paged me this am with report of new onset atrial fib with rate of 90-120 bpm for 60 seconds with return to sinus rhythm as noted on event monitor.  Will route this message to Dr. [name].  Daune Perch, AGNP-C 12/13/2016  7:28 AM Pager: [phone]

## 2016-12-13 NOTE — Progress Notes (Signed)
Chief Complaint  Patient presents with  . Palpitations    History of Present Illness: 76 yo male with history of CAD, HTN, HLD, sleep apnea here today for follow up. Cardiac caths in 2009 and 2014 with moderate non-obstructive CAD. He has not tolerated statins. He was admitted to [facility] April 2016 with unstable angina. Cardiac cath with severe disease in the LAD and RCA, both treated with drug eluting stents. Repeat cath May 2016 due to dyspnea with stable disease. Symptoms resolved off of Brilinta. Cardiac monitor June 2016 with PACs, PVCs. He has had severe anemia due to angiodysplasia. Cardiac monitor May 2017 with nocturnal bradycardia with rates as low as 22 bpm with blocked PACs. Echo June 2017 with normal LV systolic function, grade 1 diastolic dysfunction, moderate LVH. He was seen in the EP clinic by Dr. Lennie Odor 12/30/15 and sleep study was arranged but he did not keep this appt. He did not tolerate Norvasc due to LE edema and did not tolerate beta blockers due to bradycardia. I saw him 12/03/16 and he c/o episodes of dizziness but no palpitations. I arranged a cardiac monitor. We were called last night because of atrial fib/flutter noted on monitor.   He is here today for follow up. The patient denies any chest pain, dyspnea, palpitations, lower extremity edema, orthopnea, PND, dizziness, near syncope or syncope.   Primary Care Physician: [name], MD  Past Medical History:  Diagnosis Date  . Adenomatous polyp of colon 2007  . Allergy   . Anemia   . Arthritis   . AVM (arteriovenous malformation) 2011   a. S/p argon plasma coagulation and ablation in 2011, 2017.  . Back pain    lower back pain."inflammed disc" pain meds for this  . Bradycardia    a. H/o almost 7sec pause nocturnally during 2011 admission. Also has h/o fatigue with BB.  . Bronchitis   . CAD (coronary artery disease)    a. Nonobst disease by cath 2009. b. s/p PCI 10/2014 with DES to [place], patent by  [place]ook 11/2014 (Brilinta changed to Plavix with improved sx).  . Chronic diastolic CHF (congestive heart failure) ([place])   . Depression   . Diverticulosis   . Gastritis 2011  . GERD (gastroesophageal reflux disease)   . Headache(784.0)   . History of echocardiogram    Echo 1/18: EF 60-65, no RWMA, Gr 1 DD, mild MAC  . History of hiatal hernia   . History of nuclear stress test    Myoview 1/18: EF 43, no ischemia or infarct; Intermediate Risk due to low EF  . Hyperlipidemia   . Hypertension   . Hypothyroidism   . Myocardial infarction ([place])   . Obstructive sleep apnea    -no cpap use  . Pneumonia   . Premature atrial contractions Holter 2016  . PVC's (premature ventricular contractions) Holter 2016  . Restless leg syndrome   . Statin intolerance   . Transfusion history    several years ago -GI bleed    Past Surgical History:  Procedure Laterality Date  . ANKLE SURGERY Left   . APPENDECTOMY    . CARDIAC CATHETERIZATION N/A 11/26/2014   Procedure: Left Heart Cath and Coronary Angiography;  Surgeon: Burnell Blanks, MD;  Location: [facility];  Service: Cardiovascular;  Laterality: N/A;  . CHOLECYSTECTOMY N/A 08/23/2016   Procedure: LAPAROSCOPIC CHOLECYSTECTOMY;  Surgeon: Coralie Keens, MD;  Location: [facility];  Service: General;  Laterality: N/A;  .  COLONOSCOPY  08-23-05   per Dr. Deatra Ina, adenomatous polyps, repeat in 5 yrs   . COLONOSCOPY WITH PROPOFOL N/A 12/06/2015   Procedure: COLONOSCOPY WITH PROPOFOL;  Surgeon: Doran Stabler, MD;  Location: [facility];  Service: Gastroenterology;  Laterality: N/A;  . ENTEROSCOPY N/A 12/06/2015   Procedure: ENTEROSCOPY;  Surgeon: Doran Stabler, MD;  Location: [facility];  Service: Gastroenterology;  Laterality: N/A;  . ESOPHAGOGASTRODUODENOSCOPY  08-23-05   per Dr. Deatra Ina, cauterized jejunal AVMs   . HERNIA REPAIR    . HOT HEMOSTASIS N/A 12/06/2015   Procedure: HOT HEMOSTASIS (ARGON PLASMA COAGULATION/BICAP);  Surgeon:  Doran Stabler, MD;  Location: [facility];  Service: Gastroenterology;  Laterality: N/A;  . LEFT HEART CATHETERIZATION WITH CORONARY ANGIOGRAM N/A 09/08/2012   Procedure: LEFT HEART CATHETERIZATION WITH CORONARY ANGIOGRAM;  Surgeon: Burnell Blanks, MD;  Location: [facility];  Service: Cardiovascular;  Laterality: N/A;  . LEFT HEART CATHETERIZATION WITH CORONARY ANGIOGRAM N/A 11/16/2014   Procedure: LEFT HEART CATHETERIZATION WITH CORONARY ANGIOGRAM;  Surgeon: Leonie Man, MD;  Location: [facility] LAB;  Service: Cardiovascular;  Laterality: N/A;  . SKIN GRAFT Right    leg  . TONSILLECTOMY      Current Outpatient Prescriptions  Medication Sig Dispense Refill  . amoxicillin-clavulanate (AUGMENTIN) 875-125 MG tablet Take 1 tablet by mouth 2 (two) times daily. 20 tablet 0  . Ascorbic Acid (VITAMIN C PO) Take 4 tablets by mouth daily as needed (flu like symptopms).     [name] aspirin EC 81 MG tablet Take 81 mg by mouth daily.    . clonazePAM (KLONOPIN) 0.5 MG tablet Take 0.5 mg by mouth 3 (three) times daily as needed for anxiety.    . diclofenac sodium (VOLTAREN) 1 % GEL Apply 1 application topically daily as needed (ankle pain). 5 Tube 10  . ezetimibe (ZETIA) 10 MG tablet Take 1 tablet (10 mg total) by mouth daily. 30 tablet 11  . fluticasone (FLONASE) 50 MCG/ACT nasal spray Place 1 spray into both nostrils daily as needed for allergies or rhinitis. 48 g 4  . furosemide (LASIX) 20 MG tablet Take 1 tablet (20 mg total) by mouth daily. 30 tablet 11  . levothyroxine (SYNTHROID, LEVOTHROID) 100 MCG tablet TAKE 1 TABLET BY MOUTH EVERY DAY BEFORE BREAKFAST 30 tablet 11  . methocarbamol (ROBAXIN) 750 MG tablet Take 750 mg by mouth 4 (four) times daily as needed for muscle spasms.     . nitroGLYCERIN (NITROSTAT) 0.4 MG SL tablet PLACE 1 TABLET UNDER TONGUE EVERY 5 MINUTES AS NEEDED FOR CHEST PAIN 75 tablet 3  . omeprazole (PRILOSEC) 20 MG capsule Take 1 capsule (20 mg total) by mouth daily. 90  capsule 1  . Oxycodone HCl 20 MG TABS Take 1 tablet (20 mg total) by mouth every 6 (six) hours as needed (severe pain). 120 tablet 0  . polyethylene glycol (MIRALAX / GLYCOLAX) packet Take 17 g by mouth daily as needed for mild constipation.    . ropinirole (REQUIP) 5 MG tablet Take 10 mg by mouth at bedtime.  5  . Tetrahydrozoline HCl (MURINE FOR RED EYES OP) Place 1 drop into both eyes daily as needed (red eyes).    . triamcinolone cream (KENALOG) 0.1 % Apply 1 application topically 2 (two) times daily as needed. 45 g 2  . rivaroxaban (XARELTO) 20 MG TABS tablet Take 1 tablet (20 mg total) by mouth daily with supper. 30 tablet 6   No current facility-administered medications  for this visit.     Allergies  Allergen Reactions  . Brilinta [Ticagrelor] Shortness Of Breath  . Metoprolol Tartrate Other (See Comments)    Sever chest pains " flat lined patient"  . Shellfish Allergy Anaphylaxis and Hives  . Statins Other (See Comments)    All statins cause myalgias   . Zolpidem Other (See Comments)    Chest pain  . Levaquin [Levofloxacin] Hives  . Trazodone And Nefazodone Other (See Comments)    Unsteady on feet    Social History   Social History  . Marital status: Married    Spouse name: N/A  . Number of children: 1  . Years of education: N/A   Occupational History  . Disabled Unemployed   Social History Main Topics  . Smoking status: Former Smoker    Packs/day: 2.00    Years: 50.00    Types: Cigarettes    Quit date: 07/23/2002  . Smokeless tobacco: Never Used  . Alcohol use No  . Drug use: No  . Sexual activity: Not on file   Other Topics Concern  . Not on file   Social History Narrative  . No narrative on file    Family History  Problem Relation Age of Onset  . Leukemia Mother   . Heart disease Father   . Stroke Father   . Heart attack Father   . Alcoholism Paternal Uncle   . Alcoholism Maternal Grandfather   . Colon cancer Neg Hx   . Esophageal cancer Neg  Hx     Review of Systems:  As stated in the HPI and otherwise negative.   BP 130/70   Pulse 75   Ht 5' 10"  (1.778 m)   Wt 236 lb 12.8 oz (107.4 kg)   SpO2 92%   BMI 33.98 kg/m   Physical Examination: General: Well developed, well nourished, NAD  HEENT: OP clear, mucus membranes moist  SKIN: warm, dry. No rashes. Neuro: No focal deficits  Musculoskeletal: Muscle strength 5/5 all ext  Psychiatric: Mood and affect normal  Neck: No JVD, no carotid bruits, no thyromegaly, no lymphadenopathy.  Lungs:Clear bilaterally, no wheezes, rhonci, crackles Cardiovascular: Regular rate and rhythm. No murmurs, gallops or rubs. Abdomen:Soft. Bowel sounds present. Non-tender.  Extremities: No lower extremity edema. Pulses are 2 + in the bilateral DP/PT.  Echo June 2017: Left ventricle: The cavity size was normal. There was moderate   concentric hypertrophy. Systolic function was vigorous. The   estimated ejection fraction was in the range of 65% to 70%. Wall   motion was normal; there were no regional wall motion   abnormalities. There was an increased relative contribution of   atrial contraction to ventricular filling. Doppler parameters are   consistent with abnormal left ventricular relaxation (grade 1   diastolic dysfunction). - Aortic valve: Moderate diffuse thickening and calcification.  Cardiac cath 11/26/14: Left Anterior Descending   . Ost LAD to Prox LAD lesion, 50% stenosed. discrete . The lesion was not previously treated.   . Prox LAD to Mid LAD lesion, 0% stenosed. Previously placed Prox LAD to Mid LAD stent (unknown type) is patent.   Jorene Minors LAD lesion, 25% stenosed.      Left Circumflex   . Mid Cx lesion, 30% stenosed.     Right Coronary Artery   . Prox RCA lesion, 20% stenosed.   . Mid RCA lesion, 40% stenosed.   . Mid RCA to Dist RCA lesion, 30% stenosed.   . Right Posterior Descending  Artery   . RPDA lesion, 0% stenosed. Previously placed RPDA drug eluting stent is  patent.     EKG:  EKG is ordered today. The ekg ordered today demonstrates NSR, PVC. Rate 71 bpm. Old inferior Q waves  Recent Labs: 12/26/2015: TSH 23.568 07/06/2016: ALT 19 08/22/2016: BUN 11; Creatinine, Ser 1.16; Hemoglobin 16.3; Platelets 249; Potassium 4.3; Sodium 139   Lipid Panel    Component Value Date/Time   CHOL 184 03/22/2016 0935   TRIG 230 (H) 03/22/2016 0935   HDL 40 03/22/2016 0935   CHOLHDL 4.6 03/22/2016 0935   VLDL 46 (H) 03/22/2016 0935   LDLCALC 98 03/22/2016 0935     Wt Readings from Last 3 Encounters:  12/13/16 236 lb 12.8 oz (107.4 kg)  12/12/16 236 lb (107 kg)  12/03/16 233 lb 6.4 oz (105.9 kg)     Other studies Reviewed: Additional studies/ records that were reviewed today include: . Review of the above records demonstrates:    Assessment and Plan:   1. CAD without angina: he has rare chest pain. He is not on a beta blocker due to nocturnal bradycardia. Will continue ASA for now. Will stop Plavix since we are starting Xarelto. He does not tolerate statins.   2. Chronic diastolic CHF: Weight stable on daily Lasix.      3. Hyperlipidemia: He is intolerant of statins. He has been referred to the lipid clinic but has chosen not to advance therapy since his LDL is under 100.    4. Nocturnal bradycardia/New onset atrial flutter: He has atrial flutter noted on monitor. Rate was controlled. Sinus now. Will start Xarelto 20 mg once daily. If he has recurrence with tachycardia, may need EP follow up given his nocturnal bradycardia.  Check BMET and CBC today  Current medicines are reviewed at length with the patient today.  The patient does not have concerns regarding medicines.  The following changes have been made:  no change  Labs/ tests ordered today include:   Orders Placed This Encounter  Procedures  . Basic Metabolic Panel (BMET)  . CBC w/Diff  . EKG 12-Lead   Disposition:   FU with me in 2  months   Signed, [name],  MD 12/13/2016 3:20 PM    [facility] [address], [place], [place]  [address] Phone: [phone]; Fax: [phone]

## 2016-12-14 ENCOUNTER — Telehealth: Payer: Self-pay | Admitting: Cardiovascular Disease

## 2016-12-14 ENCOUNTER — Telehealth: Payer: Self-pay

## 2016-12-14 ENCOUNTER — Telehealth: Payer: Self-pay | Admitting: Internal Medicine

## 2016-12-14 LAB — CBC WITH DIFFERENTIAL/PLATELET
BASOS ABS: 0.1 10*3/uL (ref 0.0–0.2)
Basos: 1 %
EOS (ABSOLUTE): 0.6 10*3/uL — AB (ref 0.0–0.4)
Eos: 6 %
Hematocrit: 44.8 % (ref 37.5–51.0)
Hemoglobin: 14.7 g/dL (ref 13.0–17.7)
IMMATURE GRANS (ABS): 0.1 10*3/uL (ref 0.0–0.1)
Immature Granulocytes: 1 %
LYMPHS: 24 %
Lymphocytes Absolute: 2.7 10*3/uL (ref 0.7–3.1)
MCH: 28.2 pg (ref 26.6–33.0)
MCHC: 32.8 g/dL (ref 31.5–35.7)
MCV: 86 fL (ref 79–97)
MONOS ABS: 1.2 10*3/uL — AB (ref 0.1–0.9)
Monocytes: 11 %
NEUTROS ABS: 6.6 10*3/uL (ref 1.4–7.0)
Neutrophils: 57 %
PLATELETS: 268 10*3/uL (ref 150–379)
RBC: 5.21 x10E6/uL (ref 4.14–5.80)
RDW: 14.7 % (ref 12.3–15.4)
WBC: 11.3 10*3/uL — ABNORMAL HIGH (ref 3.4–10.8)

## 2016-12-14 LAB — BASIC METABOLIC PANEL
BUN/Creatinine Ratio: 13 (ref 10–24)
BUN: 14 mg/dL (ref 8–27)
CALCIUM: 9.4 mg/dL (ref 8.6–10.2)
CO2: 25 mmol/L (ref 18–29)
Chloride: 101 mmol/L (ref 96–106)
Creatinine, Ser: 1.11 mg/dL (ref 0.76–1.27)
GFR calc non Af Amer: 65 mL/min/{1.73_m2} (ref >59)
GFR, EST AFRICAN AMERICAN: 75 mL/min/{1.73_m2} (ref >59)
Glucose: 110 mg/dL — ABNORMAL HIGH (ref 65–99)
POTASSIUM: 4.5 mmol/L (ref 3.5–5.2)
SODIUM: 141 mmol/L (ref 134–144)

## 2016-12-14 NOTE — Telephone Encounter (Signed)
The EP scheduler is making pt an appointment with Dr. [name] for Tuesday, 12/18/2016 at 3:30 pm. Scheduler is going to call patient to let them know of appointment. Pt is aware that an appt is being made for him to discuss a pacemaker.

## 2016-12-14 NOTE — Telephone Encounter (Signed)
Sophia from [employer], she would like to report an abnormal EKG for patient. Please call, thanks.

## 2016-12-14 NOTE — Telephone Encounter (Signed)
Called by [employer] as the patient had a 3.6 second sinus pause.  The patient was called at home and was asleep during the pause.  He is otherwise asymptomatic.  Baruch Merl, MD, PhD Cardiology

## 2016-12-14 NOTE — Telephone Encounter (Signed)
Reviewed event monitor strip with [facility]OD (Dr. [name]) who confirmed the strip was nocturnal bradycardia. He questioned if pt could have possible OSA. He did not recommend any indication for therapy for pt's nocturnal bradycardia. Patient has a follow up appointment with Dr. [name] on 01/31/2017.

## 2016-12-14 NOTE — Telephone Encounter (Signed)
Received event monitor tracing from [place] about patient with findings of a 3.9 second pause, sinus bradycardia with PVCs and PACs noted at 6:13 am (EDT) with a HR of 20 while patient was awake and symptomatic. I called patient to check on him. He stated he got up this morning between 3:30-4:00 am. He also woke up about 6:00 am- 7:00 am and stated he felt dizzy and light headed at around 6:15 am when he was getting out of bed and he stood up and had to grab the back of a chair due to his light headedness. Pt states he feels okay at the time of the phone call.  Reviewed the  monitor tracing with Dr. [name] (DOD). Dr. [name] called patient himself while he was reviewing the monitor tracing. He asked pt how often these episodes have been occurring and pt says he's been experiencing the episodes of dizziness and light headedness for 4 or 5 years. Pt stated he was awake this morning where he felt light headed which correlates with the 3.9 second pause and sinus bradycardia of 20. Dr. [name] mentioned to patient while talking to pt on the phone that he probably needs a pacemaker to correct these pauses. Pt sounded surprised at Dr. Olin Pia recommendation. Dr. [name] is calling Dr. [name] to see if we can get patient an appointment with Dr. Curt Bears to discuss pt having a pacemaker. Waiting to hear from Dr. [name] after he gets in touch with Dr. [name]. 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 Dr. [name] talked to patient about his concern. Pt was advised that someone will call him back soon with an appointment date and time and pt verbalized understanding and stated he and his wife will be there. He thanked Dr. [name] and I for our time.

## 2016-12-14 NOTE — Telephone Encounter (Signed)
Called [employer] returning Sophia's call. Spoke with Ulice Dash who stated Sophia was busy at the moment. Ulice Dash reported that the pt had a 3.9 second on the event monitor. Patient has a history a new onset of A-Fib/A-flutter from last OV with Dr. [name] on 12/13/2016. Sophia called the pt this morning after a 3.9 second pause was noted and the pt said he felt light headed and was getting out of bed at the time. Ulice Dash stated the pt has frequent pauses. Requested for the abnormal EKG be faxed over to our office at fax # [phone]. Will route message to Dr. Lenard Simmer for further review. Received the event monitor report and will have a doctor review it. Will route to Dr. [name] for further review and recommendation. Triage nurse called patient to check on him and for labs and pt said he was okay, and that he felt light headed this morning while he was getting coffee. Ulice Dash stated pt has frequent pauses on the monitor.

## 2016-12-14 NOTE — Telephone Encounter (Signed)
I spoke to Dr. [name] and then called Mr. Rajkumar. He would consider a pacemaker. He would like an appt with EP next week to discuss further so his wife can be involved. Thanks, chris

## 2016-12-14 NOTE — Telephone Encounter (Signed)
He has nocturnal brady and has refused sleep studies. No changes. thanks

## 2016-12-18 ENCOUNTER — Ambulatory Visit (INDEPENDENT_AMBULATORY_CARE_PROVIDER_SITE_OTHER): Payer: Medicare Other | Admitting: Internal Medicine

## 2016-12-18 ENCOUNTER — Encounter (INDEPENDENT_AMBULATORY_CARE_PROVIDER_SITE_OTHER): Payer: Self-pay

## 2016-12-18 ENCOUNTER — Encounter: Payer: Self-pay | Admitting: Internal Medicine

## 2016-12-18 ENCOUNTER — Encounter: Payer: Self-pay | Admitting: *Deleted

## 2016-12-18 VITALS — BP 116/80 | HR 73 | Ht 70.0 in | Wt 235.6 lb

## 2016-12-18 DIAGNOSIS — Z01812 Encounter for preprocedural laboratory examination: Secondary | ICD-10-CM

## 2016-12-18 DIAGNOSIS — R001 Bradycardia, unspecified: Secondary | ICD-10-CM

## 2016-12-18 NOTE — Patient Instructions (Signed)
Medication Instructions: - Your physician recommends that you continue on your current medications as directed. Please refer to the Current Medication list given to you today.  Labwork: - Your physician recommends that you return for lab work: this week- BMP/CBC  Procedures/Testing: - Your physician has recommended that you have a pacemaker inserted. A pacemaker is a small device that is placed under the skin of your chest or abdomen to help control abnormal heart rhythms. This device uses electrical pulses to prompt the heart to beat at a normal rate. Pacemakers are used to treat heart rhythms that are too slow. Wire (leads) are attached to the pacemaker that goes into the chambers of you heart. This is done in the hospital and usually requires and overnight stay. Please see the instruction sheet given to you today for more information.  Follow-Up: - Your physician recommends that you schedule a follow-up appointment in: about 14 days (from 12/24/16) for a wound check with the [place] physician recommends that you schedule a follow-up appointment in: about 91 days (from 12/24/16) with Dr. [name].   Any Additional Special Instructions Will Be Listed Below (If Applicable).     If you need a refill on your cardiac medications before your next appointment, please call your pharmacy.

## 2016-12-18 NOTE — Progress Notes (Signed)
ELECTROPHYSIOLOGY CONSULT NOTE  Patient ID: [name], MRN: [id number], DOB/AGE: 75-Jun-1942 75 y.o. Admit date: (Not on file) Date of Consult: 12/18/2016  Primary Physician: Christopher Morale, MD Primary Cardiologist: [name]   [name] is being seen today for the evaluation of sinus pauses at the request of Dr [name]   HPI [name] is a 76 y.o. male referred for pauses on monitoring undertaken for dizziness  History of coronary artery disease with prior stenting 2016. He is he had normal LV function.  He has had a history of PACs and PVCs nocturnal bradycardia. He was seen by Dr. Carlyn [name]   Sleep studies were recommended but not consummated.  He has a history of syncope. This occurred about 3 years ago. He was a long distance truck driver time. There was a prodrome that she recognized. Accompanied by nausea and diaphoresis and significant recovery fatigue. Recently he has had episodes of recurrent. Abrupt onset offset presyncope. These have been primarily when he is standing. No relationship to the act of standing. There is another dizziness which is associated with the active stage.  He saw Dr. Kendra [name] about 10 days ago with complaints of daytime dizziness. This prompted the use of an event recorder. It was notable for profound pauses with heart rates into the 20s and pauses > 4  Seconds.  At least one of these was associated with symptoms. 2 or 3 of them occurred during waking hours.  Atrial flutter was also noted on the monitor with 6:1 conduction    thromboembolic risk factors are normal for age, hypertension, coronary artery disease 3 CHADS-VASc score of greater than or equal to 4.  He was started on Rivaroxaban      Past Medical History:  Diagnosis Date  . Adenomatous polyp of colon 2007  . Allergy   . Anemia   . Arthritis   . AVM (arteriovenous malformation) 2011   a. S/p argon plasma coagulation and ablation in 2011, 2017.  . Back pain    lower back  pain."inflammed disc" pain meds for this  . Bradycardia    a. H/o almost 7sec pause nocturnally during 2011 admission. Also has h/o fatigue with BB.  . Bronchitis   . CAD (coronary artery disease)    a. Nonobst disease by cath 2009. b. s/p PCI 10/2014 with DES to [name], patent by relook 11/2014 (Brilinta changed to Plavix with improved sx).  . Chronic diastolic CHF (congestive heart failure) ([place])   . Depression   . Diverticulosis   . Gastritis 2011  . GERD (gastroesophageal reflux disease)   . Headache(784.0)   . History of echocardiogram    Echo 1/18: EF 60-65, no RWMA, Gr 1 DD, mild MAC  . History of hiatal hernia   . History of nuclear stress test    Myoview 1/18: EF 43, no ischemia or infarct; Intermediate Risk due to low EF  . Hyperlipidemia   . Hypertension   . Hypothyroidism   . Myocardial infarction ([place])   . Obstructive sleep apnea    -no cpap use  . Pneumonia   . Premature atrial contractions Holter 2016  . PVC's (premature ventricular contractions) Holter 2016  . Restless leg syndrome   . Statin intolerance   . Transfusion history    several years ago -GI bleed      Surgical History:  Past Surgical History:  Procedure Laterality Date  . ANKLE SURGERY Left   .  APPENDECTOMY    . CARDIAC CATHETERIZATION N/A 11/26/2014   Procedure: Left Heart Cath and Coronary Angiography;  Surgeon: Burnell Blanks, MD;  Location: [facility];  Service: Cardiovascular;  Laterality: N/A;  . CHOLECYSTECTOMY N/A 08/23/2016   Procedure: LAPAROSCOPIC CHOLECYSTECTOMY;  Surgeon: Coralie Keens, MD;  Location: [facility];  Service: General;  Laterality: N/A;  . COLONOSCOPY  08-23-05   per Dr. Deatra Ina, adenomatous polyps, repeat in 5 yrs   . COLONOSCOPY WITH PROPOFOL N/A 12/06/2015   Procedure: COLONOSCOPY WITH PROPOFOL;  Surgeon: Doran Stabler, MD;  Location: [facility];  Service: Gastroenterology;  Laterality: N/A;  . ENTEROSCOPY N/A 12/06/2015   Procedure: ENTEROSCOPY;   Surgeon: Doran Stabler, MD;  Location: [facility];  Service: Gastroenterology;  Laterality: N/A;  . ESOPHAGOGASTRODUODENOSCOPY  08-23-05   per Dr. Deatra Ina, cauterized jejunal AVMs   . HERNIA REPAIR    . HOT HEMOSTASIS N/A 12/06/2015   Procedure: HOT HEMOSTASIS (ARGON PLASMA COAGULATION/BICAP);  Surgeon: Doran Stabler, MD;  Location: [facility];  Service: Gastroenterology;  Laterality: N/A;  . LEFT HEART CATHETERIZATION WITH CORONARY ANGIOGRAM N/A 09/08/2012   Procedure: LEFT HEART CATHETERIZATION WITH CORONARY ANGIOGRAM;  Surgeon: Burnell Blanks, MD;  Location: [facility];  Service: Cardiovascular;  Laterality: N/A;  . LEFT HEART CATHETERIZATION WITH CORONARY ANGIOGRAM N/A 11/16/2014   Procedure: LEFT HEART CATHETERIZATION WITH CORONARY ANGIOGRAM;  Surgeon: Leonie Man, MD;  Location: [facility] LAB;  Service: Cardiovascular;  Laterality: N/A;  . SKIN GRAFT Right    leg  . TONSILLECTOMY       Home Meds: Prior to Admission medications   Medication Sig Start Date End Date Taking? Authorizing Provider  amoxicillin-clavulanate (AUGMENTIN) 875-125 MG tablet Take 1 tablet by mouth 2 (two) times daily. 12/12/16  Yes Christopher Morale, MD  Ascorbic Acid (VITAMIN C PO) Take 4 tablets by mouth daily as needed (flu like symptopms).    Yes [provider]  aspirin EC 81 MG tablet Take 81 mg by mouth daily.   Yes [provider]  clonazePAM (KLONOPIN) 0.5 MG tablet Take 0.5 mg by mouth 3 (three) times daily as needed for anxiety.   Yes [provider]  diclofenac sodium (VOLTAREN) 1 % GEL Apply 1 application topically daily as needed (ankle pain). 11/27/14  Yes Barrett, Evelene Croon, PA-C  ezetimibe (ZETIA) 10 MG tablet Take 1 tablet (10 mg total) by mouth daily. 04/06/15  Yes Burnell Blanks, MD  fluticasone (FLONASE) 50 MCG/ACT nasal spray Place 1 spray into both nostrils daily as needed for allergies or rhinitis. 12/07/16  Yes Christopher Morale, MD  furosemide  (LASIX) 20 MG tablet Take 1 tablet (20 mg total) by mouth daily. 12/03/16 03/03/17 Yes Burnell Blanks, MD  levothyroxine (SYNTHROID, LEVOTHROID) 100 MCG tablet TAKE 1 TABLET BY MOUTH EVERY DAY BEFORE BREAKFAST 05/15/16  Yes Christopher Morale, MD  methocarbamol (ROBAXIN) 750 MG tablet Take 750 mg by mouth 4 (four) times daily as needed for muscle spasms.    Yes [provider]  nitroGLYCERIN (NITROSTAT) 0.4 MG SL tablet PLACE 1 TABLET UNDER TONGUE EVERY 5 MINUTES AS NEEDED FOR CHEST PAIN 04/01/15  Yes Burnell Blanks, MD  omeprazole (PRILOSEC) 20 MG capsule Take 1 capsule (20 mg total) by mouth daily. 04/13/16  Yes Christopher Morale, MD  Oxycodone HCl 20 MG TABS Take 1 tablet (20 mg total) by mouth every 6 (six) hours as needed (severe pain). 11/27/16  Yes Christopher Morale, MD  polyethylene glycol (MIRALAX / GLYCOLAX) packet Take 17 g by mouth daily as needed for mild constipation.   Yes [provider]  rivaroxaban (XARELTO) 20 MG TABS tablet Take 1 tablet (20 mg total) by mouth daily with supper. 12/13/16  Yes Burnell Blanks, MD  ropinirole (REQUIP) 5 MG tablet Take 10 mg by mouth at bedtime. 10/20/16  Yes [provider]  Tetrahydrozoline HCl (MURINE FOR RED EYES OP) Place 1 drop into both eyes daily as needed (red eyes).   Yes [provider]  triamcinolone cream (KENALOG) 0.1 % Apply 1 application topically 2 (two) times daily as needed (as directed for skin).   Yes [provider]    Allergies:  Allergies  Allergen Reactions  . Brilinta [Ticagrelor] Shortness Of Breath  . Metoprolol Tartrate Other (See Comments)    Sever chest pains " flat lined patient"  . Mirapex [Pramipexole Dihydrochloride]     Severe leg pain  . Shellfish Allergy Anaphylaxis and Hives  . Statins Other (See Comments)    All statins cause myalgias   . Zolpidem Other (See Comments)    Chest pain  . Levaquin [Levofloxacin] Hives  . Trazodone And Nefazodone Other  (See Comments)    Unsteady on feet    Social History   Social History  . Marital status: Married    Spouse name: N/A  . Number of children: 1  . Years of education: N/A   Occupational History  . Disabled Unemployed   Social History Main Topics  . Smoking status: Former Smoker    Packs/day: 2.00    Years: 50.00    Types: Cigarettes    Quit date: 07/23/2002  . Smokeless tobacco: Never Used  . Alcohol use No  . Drug use: No  . Sexual activity: Not on file   Other Topics Concern  . Not on file   Social History Narrative  . No narrative on file     Family History  Problem Relation Age of Onset  . Leukemia Mother   . Heart disease Father   . Stroke Father   . Heart attack Father   . Alcoholism Paternal Uncle   . Alcoholism Maternal Grandfather   . Colon cancer Neg Hx   . Esophageal cancer Neg Hx      ROS:  Please see the history of present illness.     All other systems reviewed and negative.    Physical Exam: Blood pressure 116/80, pulse 73, height _0  (1.778 m), weight 235 lb 9.6 oz (106.9 kg), SpO2 98 %. General: Well developed, well nourished male in no acute distress. Head: Normocephalic, atraumatic, sclera non-icteric, no xanthomas, nares are without discharge. EENT: normal  Lymph Nodes:  none Neck: Negative for carotid bruits. JVD not elevated. Back:without scoliosis kyphosis Lungs: Clear bilaterally to auscultation without wheezes, rales, or rhonchi. Breathing is unlabored. Heart: RRR with S1 S2. No  murmur . No rubs, or gallops appreciated. Abdomen: Soft, non-tender, non-distended with normoactive bowel sounds. No hepatomegaly. No rebound/guarding. No obvious abdominal masses. Msk:  Strength and tone appear normal for age. Extremities: No clubbing or cyanosis. No  edema.  Distal pedal pulses are 2+ and equal bilaterally. Skin: Warm and Dry Neuro: Alert and oriented X 3. CN III-XII intact Grossly normal sensory and motor function . Psych:  Responds to  questions appropriately with a normal affect.      Labs: Cardiac Enzymes No results for input(s): CKTOTAL, CKMB, TROPONINI  in the last 72 hours. CBC Lab Results  Component Value Date   WBC 11.3 (H) 12/13/2016   HGB 16.3 08/22/2016   HCT 44.8 12/13/2016   MCV 86 12/13/2016   PLT 268 12/13/2016   PROTIME: No results for input(s): LABPROT, INR in the last 72 hours. Chemistry  Recent Labs Lab 12/13/16 1520  NA 141  K 4.5  CL 101  CO2 25  BUN 14  CREATININE 1.11  CALCIUM 9.4  GLUCOSE 110*   Lipids Lab Results  Component Value Date   CHOL 184 03/22/2016   HDL 40 03/22/2016   LDLCALC 98 03/22/2016   TRIG 230 (H) 03/22/2016   BNP Pro B Natriuretic peptide (BNP)  Date/Time Value Ref Range Status  08/29/2009 04:44 PM 42.0 0.0 - 100.0 pg/mL Final   Thyroid Function Tests: No results for input(s): TSH, T4TOTAL, T3FREE, THYROIDAB in the last 72 hours.  Invalid input(s): FREET3 Miscellaneous Lab Results  Component Value Date   DDIMER  09/03/2007    0.34        AT THE INHOUSE ESTABLISHED CUTOFF VALUE OF 0.48 ug/mL FEU, THIS ASSAY HAS BEEN DOCUMENTED IN THE LITERATURE TO HAVE    Radiology/Studies:  No results found.  EKG: nsr _0  17/08/36 PVC  Event Recorder personnally reviewed  Sinus pauses > 4 sec  HR>>20 during waking hours assoc with lightheadedness Atrial flutter with 6:1 conduction   Assessment and Plan:  Syncope-presyncope  Sinus node dysfunction   Atrial flutter  Coronary artery disease with prior stenting  The patient has symptomatic sinus node dysfunction with prolonged pauses unassociated with any medications. As such she is a class I indication for pacing. Some of these episodes may occur with deglutition.   in addition, the patient has atrial flutter. With his thromboembolic risk profile is appropriate that he is anticoagulated. It is a secondary issue here however, hence, we will undertake catheter ablation following insertion of the  pacemaker and allowing it to heal. This will also allow [place] to monitor for atrial fibrillation which the patient has been apprised of occurs in the majority of patients following flutter ablation   The benefits and risks were reviewed including but not limited to death,  perforation, infection, lead dislodgement and device malfunction.  The patient understands agrees and is willing to proceed.'     Virl Axe

## 2016-12-19 ENCOUNTER — Other Ambulatory Visit: Payer: Medicare Other

## 2016-12-20 NOTE — Addendum Note (Signed)
Addended byAlvis Lemmings C on: 12/20/2016 02:55 PM   Modules accepted: Orders

## 2016-12-24 ENCOUNTER — Encounter (HOSPITAL_COMMUNITY)
Admission: RE | Disposition: A | Discharge status: Home / Self Care | Payer: Self-pay | Source: Ambulatory Visit | Attending: Internal Medicine

## 2016-12-24 ENCOUNTER — Encounter (HOSPITAL_COMMUNITY): Payer: Self-pay | Admitting: Nurse Practitioner

## 2016-12-24 ENCOUNTER — Ambulatory Visit (HOSPITAL_COMMUNITY)
Admission: RE | Admit: 2016-12-24 | Discharge: 2016-12-25 | Disposition: A | Discharge status: Home / Self Care | Payer: Medicare Other | Source: Ambulatory Visit | Attending: Internal Medicine | Admitting: Internal Medicine

## 2016-12-24 DIAGNOSIS — G2581 Restless legs syndrome: Secondary | ICD-10-CM | POA: Diagnosis not present

## 2016-12-24 DIAGNOSIS — Z7982 Long term (current) use of aspirin: Secondary | ICD-10-CM | POA: Insufficient documentation

## 2016-12-24 DIAGNOSIS — I4892 Unspecified atrial flutter: Secondary | ICD-10-CM | POA: Insufficient documentation

## 2016-12-24 DIAGNOSIS — I5032 Chronic diastolic (congestive) heart failure: Secondary | ICD-10-CM | POA: Diagnosis not present

## 2016-12-24 DIAGNOSIS — Z91013 Allergy to seafood: Secondary | ICD-10-CM | POA: Diagnosis not present

## 2016-12-24 DIAGNOSIS — F329 Major depressive disorder, single episode, unspecified: Secondary | ICD-10-CM | POA: Insufficient documentation

## 2016-12-24 DIAGNOSIS — R001 Bradycardia, unspecified: Secondary | ICD-10-CM

## 2016-12-24 DIAGNOSIS — I110 Hypertensive heart disease with heart failure: Secondary | ICD-10-CM | POA: Insufficient documentation

## 2016-12-24 DIAGNOSIS — I2510 Atherosclerotic heart disease of native coronary artery without angina pectoris: Secondary | ICD-10-CM | POA: Diagnosis not present

## 2016-12-24 DIAGNOSIS — Z79899 Other long term (current) drug therapy: Secondary | ICD-10-CM | POA: Insufficient documentation

## 2016-12-24 DIAGNOSIS — K219 Gastro-esophageal reflux disease without esophagitis: Secondary | ICD-10-CM | POA: Diagnosis not present

## 2016-12-24 DIAGNOSIS — G4733 Obstructive sleep apnea (adult) (pediatric): Secondary | ICD-10-CM | POA: Diagnosis not present

## 2016-12-24 DIAGNOSIS — Z888 Allergy status to other drugs, medicaments and biological substances status: Secondary | ICD-10-CM | POA: Diagnosis not present

## 2016-12-24 DIAGNOSIS — Z959 Presence of cardiac and vascular implant and graft, unspecified: Secondary | ICD-10-CM

## 2016-12-24 DIAGNOSIS — I495 Sick sinus syndrome: Secondary | ICD-10-CM | POA: Diagnosis present

## 2016-12-24 DIAGNOSIS — Z881 Allergy status to other antibiotic agents status: Secondary | ICD-10-CM | POA: Insufficient documentation

## 2016-12-24 DIAGNOSIS — Z7901 Long term (current) use of anticoagulants: Secondary | ICD-10-CM | POA: Insufficient documentation

## 2016-12-24 DIAGNOSIS — Z86010 Personal history of colonic polyps: Secondary | ICD-10-CM | POA: Diagnosis not present

## 2016-12-24 DIAGNOSIS — E785 Hyperlipidemia, unspecified: Secondary | ICD-10-CM | POA: Insufficient documentation

## 2016-12-24 DIAGNOSIS — E039 Hypothyroidism, unspecified: Secondary | ICD-10-CM | POA: Diagnosis not present

## 2016-12-24 DIAGNOSIS — Z87891 Personal history of nicotine dependence: Secondary | ICD-10-CM | POA: Diagnosis not present

## 2016-12-24 DIAGNOSIS — I491 Atrial premature depolarization: Secondary | ICD-10-CM | POA: Insufficient documentation

## 2016-12-24 DIAGNOSIS — I252 Old myocardial infarction: Secondary | ICD-10-CM | POA: Insufficient documentation

## 2016-12-24 DIAGNOSIS — R55 Syncope and collapse: Secondary | ICD-10-CM | POA: Diagnosis not present

## 2016-12-24 HISTORY — PX: PACEMAKER IMPLANT: EP1218

## 2016-12-24 HISTORY — DX: Unspecified atrial flutter: I48.92

## 2016-12-24 LAB — SURGICAL PCR SCREEN
MRSA, PCR: NEGATIVE
Staphylococcus aureus: NEGATIVE

## 2016-12-24 LAB — TSH: TSH: 16.966 u[IU]/mL — AB (ref 0.350–4.500)

## 2016-12-24 SURGERY — PACEMAKER IMPLANT

## 2016-12-24 MED ORDER — METHOCARBAMOL 500 MG PO TABS: 750.0000 mg | ORAL_TABLET | Freq: Four times a day (QID) | ORAL | Status: DC | PRN

## 2016-12-24 MED ORDER — OXYCODONE HCL 5 MG PO TABS: 20.0000 mg | ORAL_TABLET | Freq: Four times a day (QID) | ORAL | Status: DC | PRN

## 2016-12-24 MED ORDER — CEFAZOLIN SODIUM-DEXTROSE 2-4 GM/100ML-% IV SOLN
2.0000 g | INTRAVENOUS | Status: AC
  Administered 2016-12-24: 2 g via INTRAVENOUS

## 2016-12-24 MED ORDER — ROPINIROLE HCL 1 MG PO TABS
10.0000 mg | ORAL_TABLET | Freq: Every day | ORAL | Status: DC
  Administered 2016-12-24: 10 mg via ORAL
  Filled 2016-12-24: qty 10

## 2016-12-24 MED ORDER — EZETIMIBE 10 MG PO TABS
10.0000 mg | ORAL_TABLET | Freq: Every day | ORAL | Status: DC
  Administered 2016-12-24 – 2016-12-25 (×2): 10 mg via ORAL
  Filled 2016-12-24 (×2): qty 1

## 2016-12-24 MED ORDER — ONDANSETRON HCL 4 MG/2ML IJ SOLN: 4.0000 mg | Freq: Four times a day (QID) | INTRAMUSCULAR | Status: DC | PRN

## 2016-12-24 MED ORDER — CLONAZEPAM 0.5 MG PO TABS: 0.5000 mg | ORAL_TABLET | Freq: Three times a day (TID) | ORAL | Status: DC | PRN

## 2016-12-24 MED ORDER — HEPARIN (PORCINE) IN NACL 2-0.9 UNIT/ML-% IJ SOLN
INTRAMUSCULAR | Status: AC | PRN
  Administered 2016-12-24: 500 mL

## 2016-12-24 MED ORDER — HEPARIN (PORCINE) IN NACL 2-0.9 UNIT/ML-% IJ SOLN
INTRAMUSCULAR | Status: AC
  Filled 2016-12-24: qty 500

## 2016-12-24 MED ORDER — LIDOCAINE HCL (PF) 1 % IJ SOLN
INTRAMUSCULAR | Status: DC | PRN
  Administered 2016-12-24: 40 mL via INTRADERMAL

## 2016-12-24 MED ORDER — FENTANYL CITRATE (PF) 100 MCG/2ML IJ SOLN
INTRAMUSCULAR | Status: AC
  Filled 2016-12-24: qty 2

## 2016-12-24 MED ORDER — SODIUM CHLORIDE 0.9 % IV SOLN
INTRAVENOUS | Status: DC
  Administered 2016-12-24: 07:00:00 via INTRAVENOUS

## 2016-12-24 MED ORDER — POLYETHYLENE GLYCOL 3350 17 G PO PACK: 17.0000 g | PACK | Freq: Every day | ORAL | Status: DC | PRN

## 2016-12-24 MED ORDER — SODIUM CHLORIDE 0.9 % IR SOLN
80.0000 mg | Status: AC
  Administered 2016-12-24: 80 mg

## 2016-12-24 MED ORDER — SODIUM CHLORIDE 0.9 % IR SOLN
Status: AC
  Filled 2016-12-24: qty 2

## 2016-12-24 MED ORDER — MIDAZOLAM HCL 5 MG/5ML IJ SOLN
INTRAMUSCULAR | Status: DC | PRN
  Administered 2016-12-24 (×2): 1 mg via INTRAVENOUS

## 2016-12-24 MED ORDER — DILTIAZEM HCL ER COATED BEADS 120 MG PO CP24
120.0000 mg | ORAL_CAPSULE | Freq: Every day | ORAL | Status: DC
  Administered 2016-12-24 – 2016-12-25 (×2): 120 mg via ORAL
  Filled 2016-12-24 (×3): qty 1

## 2016-12-24 MED ORDER — LIDOCAINE HCL (PF) 1 % IJ SOLN
INTRAMUSCULAR | Status: AC
  Filled 2016-12-24: qty 60

## 2016-12-24 MED ORDER — LEVOTHYROXINE SODIUM 100 MCG PO TABS
100.0000 ug | ORAL_TABLET | Freq: Every day | ORAL | Status: DC
  Administered 2016-12-25: 100 ug via ORAL
  Filled 2016-12-24: qty 1

## 2016-12-24 MED ORDER — MIDAZOLAM HCL 5 MG/5ML IJ SOLN
INTRAMUSCULAR | Status: AC
  Filled 2016-12-24: qty 5

## 2016-12-24 MED ORDER — FLUTICASONE PROPIONATE 50 MCG/ACT NA SUSP: 1.0000 | Freq: Every day | NASAL | Status: DC | PRN

## 2016-12-24 MED ORDER — TRAMADOL HCL 50 MG PO TABS
25.0000 mg | ORAL_TABLET | Freq: Four times a day (QID) | ORAL | Status: DC
  Administered 2016-12-24 – 2016-12-25 (×4): 25 mg via ORAL
  Filled 2016-12-24 (×4): qty 1

## 2016-12-24 MED ORDER — MUPIROCIN 2 % EX OINT
1.0000 "application " | TOPICAL_OINTMENT | Freq: Once | CUTANEOUS | Status: AC
  Administered 2016-12-24: 1 via TOPICAL
  Filled 2016-12-24: qty 22

## 2016-12-24 MED ORDER — CEFAZOLIN SODIUM-DEXTROSE 2-4 GM/100ML-% IV SOLN
INTRAVENOUS | Status: AC
  Filled 2016-12-24: qty 100

## 2016-12-24 MED ORDER — CHLORHEXIDINE GLUCONATE 4 % EX LIQD
60.0000 mL | Freq: Once | CUTANEOUS | Status: DC
  Filled 2016-12-24: qty 60

## 2016-12-24 MED ORDER — FENTANYL CITRATE (PF) 100 MCG/2ML IJ SOLN
INTRAMUSCULAR | Status: DC | PRN
  Administered 2016-12-24: 25 ug via INTRAVENOUS

## 2016-12-24 MED ORDER — ACETAMINOPHEN 325 MG PO TABS: 325.0000 mg | ORAL_TABLET | ORAL | Status: DC | PRN

## 2016-12-24 MED ORDER — CEFAZOLIN SODIUM-DEXTROSE 1-4 GM/50ML-% IV SOLN
1.0000 g | Freq: Four times a day (QID) | INTRAVENOUS | Status: AC
  Administered 2016-12-24 – 2016-12-25 (×3): 1 g via INTRAVENOUS
  Filled 2016-12-24 (×3): qty 50

## 2016-12-24 MED ORDER — PANTOPRAZOLE SODIUM 40 MG PO TBEC
40.0000 mg | DELAYED_RELEASE_TABLET | Freq: Every day | ORAL | Status: DC
  Administered 2016-12-24 – 2016-12-25 (×2): 40 mg via ORAL
  Filled 2016-12-24 (×2): qty 1

## 2016-12-24 MED ORDER — MUPIROCIN 2 % EX OINT
TOPICAL_OINTMENT | CUTANEOUS | Status: AC
  Administered 2016-12-24: 1 via TOPICAL
  Filled 2016-12-24: qty 22

## 2016-12-24 SURGICAL SUPPLY — 8 items
CABLE SURGICAL S-101-97-12 (CABLE) ×1 IMPLANT
IPG PACE AZUR XT DR MRI W1DR01 (Pacemaker) IMPLANT
LEAD CAPSURE NOVUS 5076-52CM (Lead) ×1 IMPLANT
LEAD CAPSURE NOVUS 5076-58CM (Lead) ×1 IMPLANT
PACE AZURE XT DR MRI W1DR01 (Pacemaker) ×2 IMPLANT
PAD DEFIB LIFELINK (PAD) ×1 IMPLANT
SHEATH CLASSIC 7F (SHEATH) ×2 IMPLANT
TRAY PACEMAKER INSERTION (PACKS) ×1 IMPLANT

## 2016-12-24 NOTE — Discharge Instructions (Signed)
° ° °  Supplemental Discharge Instructions for  Pacemaker/Defibrillator Patients  Activity No heavy lifting or vigorous activity with your left/right arm for 6 to 8 weeks.  Do not raise your left/right arm above your head for one week.  Gradually raise your affected arm as drawn below.           __        12/28/16                       12/29/16                         12/30/16                 12/31/16  NO DRIVING for  1 week   ; you may begin driving on  0/92/33   .  WOUND CARE - Keep the wound area clean and dry.  - No bandage is needed on the site.  DO  NOT apply any creams, oils, or ointments to the wound area. - If you notice any drainage or discharge from the wound, any swelling or bruising at the site, or you develop a fever > 101? F after you are discharged home, call the office at once.  Special Instructions - You are still able to use cellular telephones; use the ear opposite the side where you have your pacemaker/defibrillator.  Avoid carrying your cellular phone near your device. - When traveling through airports, show security personnel your identification card to avoid being screened in the metal detectors.  Ask the security personnel to use the hand wand. - Avoid arc welding equipment, TENS units (transcutaneous nerve stimulators).  Call the office for questions about other devices. - Avoid electrical appliances that are in poor condition or are not properly grounded. - Microwave ovens are safe to be near or to operate.

## 2016-12-24 NOTE — Interval H&P Note (Signed)
History and Physical Interval Note:  12/24/2016 7:28 AM  [name]  has presented today for surgery, with the diagnosis of sinus node dysfunction  The various methods of treatment have been discussed with the patient and family. After consideration of risks, benefits and other options for treatment, the patient has consented to  Procedure(s): Pacemaker Implant (N/A) as a surgical intervention .  The patient's history has been reviewed, patient examined, no change in status, stable for surgery.  I have reviewed the patient's chart and labs.  Questions were answered to the patient's satisfaction.     Virl Axe

## 2016-12-24 NOTE — Progress Notes (Signed)
Pt arrived to 2w from cath lab. Vitals obtained and being monitored frequently, per protocol. Telemetry box applied and CCMD notified. Pt oriented to room and staff. Pt resting in bed at this time. Will continue current plan of care.  Grant Fontana BSN, RN

## 2016-12-24 NOTE — H&P (View-Only) (Signed)
ELECTROPHYSIOLOGY CONSULT NOTE  Patient ID: [name], MRN: [id number], DOB/AGE: [birth year] 76 y.o. Admit date: (Not on file) Date of Consult: 12/18/2016  Primary Physician: [name], MD Primary Cardiologist: Cmac   [name] is being seen today for the evaluation of sinus pauses at the request of Dr CMac   HPI [name] is a 76 y.o. male referred for pauses on monitoring undertaken for dizziness  History of coronary artery disease with prior stenting 2016. He is he had normal LV function.  He has had a history of PACs and PVCs nocturnal bradycardia. He was seen by Dr. Carlyn Reichert   Sleep studies were recommended but not consummated.  He has a history of syncope. This occurred about 3 years ago. He was a long distance truck driver time. There was a prodrome that she recognized. Accompanied by nausea and diaphoresis and significant recovery fatigue. Recently he has had episodes of recurrent. Abrupt onset offset presyncope. These have been primarily when he is standing. No relationship to the act of standing. There is another dizziness which is associated with the active stage.  He saw Dr. Kendra Opitz about 10 days ago with complaints of daytime dizziness. This prompted the use of an event recorder. It was notable for profound pauses with heart rates into the 20s and pauses > 4  Seconds.  At least one of these was associated with symptoms. 2 or 3 of them occurred during waking hours.  Atrial flutter was also noted on the monitor with 6:1 conduction    thromboembolic risk factors are normal for age, hypertension, coronary artery disease 3 CHADS-VASc score of greater than or equal to 4.  He was started on Rivaroxaban      Past Medical History:  Diagnosis Date  . Adenomatous polyp of colon 2007  . Allergy   . Anemia   . Arthritis   . AVM (arteriovenous malformation) 2011   a. S/p argon plasma coagulation and ablation in 2011, 2017.  . Back pain    lower back  pain."inflammed disc" pain meds for this  . Bradycardia    a. H/o almost 7sec pause nocturnally during 2011 admission. Also has h/o fatigue with BB.  . Bronchitis   . CAD (coronary artery disease)    a. Nonobst disease by cath 2009. b. s/p PCI 10/2014 with DES to [place], patent by relook 11/2014 (Brilinta changed to Plavix with improved sx).  . Chronic diastolic CHF (congestive heart failure) ([place])   . Depression   . Diverticulosis   . Gastritis 2011  . GERD (gastroesophageal reflux disease)   . Headache(784.0)   . History of echocardiogram    Echo 1/18: EF 60-65, no RWMA, Gr 1 DD, mild MAC  . History of hiatal hernia   . History of nuclear stress test    Myoview 1/18: EF 43, no ischemia or infarct; Intermediate Risk due to low EF  . Hyperlipidemia   . Hypertension   . Hypothyroidism   . Myocardial infarction ([place])   . Obstructive sleep apnea    -no cpap use  . Pneumonia   . Premature atrial contractions Holter 2016  . PVC's (premature ventricular contractions) Holter 2016  . Restless leg syndrome   . Statin intolerance   . Transfusion history    several years ago -GI bleed      Surgical History:  Past Surgical History:  Procedure Laterality Date  . ANKLE SURGERY Left   .  APPENDECTOMY    . CARDIAC CATHETERIZATION N/A 11/26/2014   Procedure: Left Heart Cath and Coronary Angiography;  Surgeon: Burnell Blanks, MD;  Location: [facility];  Service: Cardiovascular;  Laterality: N/A;  . CHOLECYSTECTOMY N/A 08/23/2016   Procedure: LAPAROSCOPIC CHOLECYSTECTOMY;  Surgeon: Coralie Keens, MD;  Location: [facility];  Service: General;  Laterality: N/A;  . COLONOSCOPY  08-23-05   per Dr. Deatra Ina, adenomatous polyps, repeat in 5 yrs   . COLONOSCOPY WITH PROPOFOL N/A 12/06/2015   Procedure: COLONOSCOPY WITH PROPOFOL;  Surgeon: Doran Stabler, MD;  Location: [facility];  Service: Gastroenterology;  Laterality: N/A;  . ENTEROSCOPY N/A 12/06/2015   Procedure: ENTEROSCOPY;   Surgeon: Doran Stabler, MD;  Location: [facility];  Service: Gastroenterology;  Laterality: N/A;  . ESOPHAGOGASTRODUODENOSCOPY  08-23-05   per Dr. Deatra Ina, cauterized jejunal AVMs   . HERNIA REPAIR    . HOT HEMOSTASIS N/A 12/06/2015   Procedure: HOT HEMOSTASIS (ARGON PLASMA COAGULATION/BICAP);  Surgeon: Doran Stabler, MD;  Location: [facility];  Service: Gastroenterology;  Laterality: N/A;  . LEFT HEART CATHETERIZATION WITH CORONARY ANGIOGRAM N/A 09/08/2012   Procedure: LEFT HEART CATHETERIZATION WITH CORONARY ANGIOGRAM;  Surgeon: Burnell Blanks, MD;  Location: [facility];  Service: Cardiovascular;  Laterality: N/A;  . LEFT HEART CATHETERIZATION WITH CORONARY ANGIOGRAM N/A 11/16/2014   Procedure: LEFT HEART CATHETERIZATION WITH CORONARY ANGIOGRAM;  Surgeon: Leonie Man, MD;  Location: [facility];  Service: Cardiovascular;  Laterality: N/A;  . SKIN GRAFT Right    leg  . TONSILLECTOMY       Home Meds: Prior to Admission medications   Medication Sig Start Date End Date Taking? Authorizing Provider  amoxicillin-clavulanate (AUGMENTIN) 875-125 MG tablet Take 1 tablet by mouth 2 (two) times daily. 12/12/16  Yes [name], MD  Ascorbic Acid (VITAMIN C PO) Take 4 tablets by mouth daily as needed (flu like symptopms).    Yes [provider]  aspirin EC 81 MG tablet Take 81 mg by mouth daily.   Yes [provider]  clonazePAM (KLONOPIN) 0.5 MG tablet Take 0.5 mg by mouth 3 (three) times daily as needed for anxiety.   Yes [provider]  diclofenac sodium (VOLTAREN) 1 % GEL Apply 1 application topically daily as needed (ankle pain). 11/27/14  Yes Barrett, Evelene Croon, PA-C  ezetimibe (ZETIA) 10 MG tablet Take 1 tablet (10 mg total) by mouth daily. 04/06/15  Yes Burnell Blanks, MD  fluticasone (FLONASE) 50 MCG/ACT nasal spray Place 1 spray into both nostrils daily as needed for allergies or rhinitis. 12/07/16  Yes [name], MD  furosemide  (LASIX) 20 MG tablet Take 1 tablet (20 mg total) by mouth daily. 12/03/16 03/03/17 Yes Burnell Blanks, MD  levothyroxine (SYNTHROID, LEVOTHROID) 100 MCG tablet TAKE 1 TABLET BY MOUTH EVERY DAY BEFORE BREAKFAST 05/15/16  Yes [name], MD  methocarbamol (ROBAXIN) 750 MG tablet Take 750 mg by mouth 4 (four) times daily as needed for muscle spasms.    Yes [provider]  nitroGLYCERIN (NITROSTAT) 0.4 MG SL tablet PLACE 1 TABLET UNDER TONGUE EVERY 5 MINUTES AS NEEDED FOR CHEST PAIN 04/01/15  Yes Burnell Blanks, MD  omeprazole (PRILOSEC) 20 MG capsule Take 1 capsule (20 mg total) by mouth daily. 04/13/16  Yes [name], MD  Oxycodone HCl 20 MG TABS Take 1 tablet (20 mg total) by mouth every 6 (six) hours as needed (severe pain). 11/27/16  Yes Fry, Stephen A, MD  polyethylene glycol (MIRALAX / GLYCOLAX) packet Take 17 g by mouth daily as needed for mild constipation.   Yes [provider]  rivaroxaban (XARELTO) 20 MG TABS tablet Take 1 tablet (20 mg total) by mouth daily with supper. 12/13/16  Yes McAlhany, Christopher D, MD  ropinirole (REQUIP) 5 MG tablet Take 10 mg by mouth at bedtime. 10/20/16  Yes [provider]  Tetrahydrozoline HCl (MURINE FOR RED EYES OP) Place 1 drop into both eyes daily as needed (red eyes).   Yes [provider]  triamcinolone cream (KENALOG) 0.1 % Apply 1 application topically 2 (two) times daily as needed (as directed for skin).   Yes [provider]    Allergies:  Allergies  Allergen Reactions  . Brilinta [Ticagrelor] Shortness Of Breath  . Metoprolol Tartrate Other (See Comments)    Sever chest pains " flat lined patient"  . Mirapex [Pramipexole Dihydrochloride]     Severe leg pain  . Shellfish Allergy Anaphylaxis and Hives  . Statins Other (See Comments)    All statins cause myalgias   . Zolpidem Other (See Comments)    Chest pain  . Levaquin [Levofloxacin] Hives  . Trazodone And Nefazodone Other  (See Comments)    Unsteady on feet    Social History   Social History  . Marital status: Married    Spouse name: N/A  . Number of children: 1  . Years of education: N/A   Occupational History  . Disabled Unemployed   Social History Main Topics  . Smoking status: Former Smoker    Packs/day: 2.00    Years: 50.00    Types: Cigarettes    Quit date: 07/23/2002  . Smokeless tobacco: Never Used  . Alcohol use No  . Drug use: No  . Sexual activity: Not on file   Other Topics Concern  . Not on file   Social History Narrative  . No narrative on file     Family History  Problem Relation Age of Onset  . Leukemia Mother   . Heart disease Father   . Stroke Father   . Heart attack Father   . Alcoholism Paternal Uncle   . Alcoholism Maternal Grandfather   . Colon cancer Neg Hx   . Esophageal cancer Neg Hx      ROS:  Please see the history of present illness.     All other systems reviewed and negative.    Physical Exam: Blood pressure 116/80, pulse 73, height 5' 10" (1.778 m), weight 235 lb 9.6 oz (106.9 kg), SpO2 98 %. General: Well developed, well nourished male in no acute distress. Head: Normocephalic, atraumatic, sclera non-icteric, no xanthomas, nares are without discharge. EENT: normal  Lymph Nodes:  none Neck: Negative for carotid bruits. JVD not elevated. Back:without scoliosis kyphosis Lungs: Clear bilaterally to auscultation without wheezes, rales, or rhonchi. Breathing is unlabored. Heart: RRR with S1 S2. No  murmur . No rubs, or gallops appreciated. Abdomen: Soft, non-tender, non-distended with normoactive bowel sounds. No hepatomegaly. No rebound/guarding. No obvious abdominal masses. Msk:  Strength and tone appear normal for age. Extremities: No clubbing or cyanosis. No  edema.  Distal pedal pulses are 2+ and equal bilaterally. Skin: Warm and Dry Neuro: Alert and oriented X 3. CN III-XII intact Grossly normal sensory and motor function . Psych:  Responds to  questions appropriately with a normal affect.      Labs: Cardiac Enzymes No results for input(s): CKTOTAL, CKMB, TROPONINI   in the last 72 hours. CBC Lab Results  Component Value Date   WBC 11.3 (H) 12/13/2016   HGB 16.3 08/22/2016   HCT 44.8 12/13/2016   MCV 86 12/13/2016   PLT 268 12/13/2016   PROTIME: No results for input(s): LABPROT, INR in the last 72 hours. Chemistry  Recent Labs Lab 12/13/16 1520  NA 141  K 4.5  CL 101  CO2 25  BUN 14  CREATININE 1.11  CALCIUM 9.4  GLUCOSE 110*   Lipids Lab Results  Component Value Date   CHOL 184 03/22/2016   HDL 40 03/22/2016   LDLCALC 98 03/22/2016   TRIG 230 (H) 03/22/2016   BNP Pro B Natriuretic peptide (BNP)  Date/Time Value Ref Range Status  08/29/2009 04:44 PM 42.0 0.0 - 100.0 pg/mL Final   Thyroid Function Tests: No results for input(s): TSH, T4TOTAL, T3FREE, THYROIDAB in the last 72 hours.  Invalid input(s): FREET3 Miscellaneous Lab Results  Component Value Date   DDIMER  09/03/2007    0.34        AT THE INHOUSE ESTABLISHED CUTOFF VALUE OF 0.48 ug/mL FEU, THIS ASSAY HAS BEEN DOCUMENTED IN THE LITERATURE TO HAVE    Radiology/Studies:  No results found.  EKG: nsr _0  17/08/36 PVC  Event Recorder personnally reviewed  Sinus pauses > 4 sec  HR>>20 during waking hours assoc with lightheadedness Atrial flutter with 6:1 conduction   Assessment and Plan:  Syncope-presyncope  Sinus node dysfunction   Atrial flutter  Coronary artery disease with prior stenting  The patient has symptomatic sinus node dysfunction with prolonged pauses unassociated with any medications. As such she is a class I indication for pacing. Some of these episodes may occur with deglutition.   in addition, the patient has atrial flutter. With his thromboembolic risk profile is appropriate that he is anticoagulated. It is a secondary issue here however, hence, we will undertake catheter ablation following insertion of the  pacemaker and allowing it to heal. This will also allow [place] to monitor for atrial fibrillation which the patient has been apprised of occurs in the majority of patients following flutter ablation   The benefits and risks were reviewed including but not limited to death,  perforation, infection, lead dislodgement and device malfunction.  The patient understands agrees and is willing to proceed.'     Virl Axe

## 2016-12-24 NOTE — Discharge Summary (Signed)
ELECTROPHYSIOLOGY PROCEDURE DISCHARGE SUMMARY    Patient ID: [name],  MRN: [id number], DOB/AGE: [birth year] 75 y.o.  Admit date: 12/24/2016 Discharge date: 12/25/2016  Primary Care Physician: [name], MD Primary Cardiologist: [name] Electrophysiologist: [name]  Primary Discharge Diagnosis:  Symptomatic bradycardia status post pacemaker implantation this admission  Secondary Discharge Diagnosis:  1.  Atrial flutter 2.  CAD 3.  HTN 4.  Hypothyroidism 5.  OSA  Allergies  Allergen Reactions  . Brilinta [Ticagrelor] Shortness Of Breath  . Metoprolol Tartrate Other (See Comments)    Sever chest pains " flat lined patient"  . Mirapex [Pramipexole Dihydrochloride]     Severe leg pain  . Shellfish Allergy Anaphylaxis and Hives  . Statins Other (See Comments)    All statins cause myalgias   . Zolpidem Other (See Comments)    Chest pain  . Levaquin [Levofloxacin] Hives  . Trazodone And Nefazodone Other (See Comments)    Unsteady on feet     Procedures This Admission:  1.  Implantation of a MDT dual chamber PPM on 12/24/16 by Dr [name].  The patient received a MDT model number [employer]a PPM with model number 5076 right atrial lead and 5076 right ventricular lead. There were no immediate post procedure complications. 2.  CXR on 12/25/16 demonstrated no pneumothorax status post device implantation.   Brief HPI: [name] is a 76 y.o. male was referred to electrophysiology in the outpatient setting for consideration of PPM implantation.  Past medical history includes atrial flutter, CAD, HTN.  The patient has had symptomatic bradycardia without reversible causes identified.  Risks, benefits, and alternatives to PPM implantation were reviewed with the patient who wished to proceed.   Hospital Course:  The patient was admitted and underwent implantation of a MDT dual chamber PPM with details as outlined above.  He  was monitored on telemetry overnight which  demonstrated sinus rhythm with intermittent A pacing.  Left chest was without hematoma or ecchymosis.  The device was interrogated and found to be functioning normally.  CXR was obtained and demonstrated no pneumothorax status post device implantation.  Wound care, arm mobility, and restrictions were reviewed with the patient.  The patient was examined by Dr [name] and considered stable for discharge to home.   Diltiazem was started after procedure for frequent atrial and ventricular ectopy.   Continue Xarelto for CHADS2VASC of 4. Plans for outpatient elective flutter ablation noted.  Per Dr [name], will increase Synthroid to 162mcg daily at discharge.     Physical Exam: Vitals:   12/24/16 1100 12/24/16 1345 12/24/16 2100 12/25/16 0527  BP: (!) 133/91 136/66 107/70 118/76  Pulse:  77 72 61  Resp:  17 18 18   Temp:  97.9 F (36.6 C) 98.3 F (36.8 C) 97.9 F (36.6 C)  TempSrc:  Oral Oral Oral  SpO2:  96% 95% 95%  Weight:      Height:          Labs:   Lab Results  Component Value Date   WBC 11.3 (H) 12/13/2016   HGB 16.3 08/22/2016   HCT 44.8 12/13/2016   MCV 86 12/13/2016   PLT 268 12/13/2016   No results for input(s): NA, K, CL, CO2, BUN, CREATININE, CALCIUM, PROT, BILITOT, ALKPHOS, ALT, AST, GLUCOSE in the last 168 hours.  Invalid input(s): LABALBU  Discharge Medications:  Allergies as of 12/25/2016      Reactions   Brilinta [ticagrelor] Shortness Of Breath   Metoprolol Tartrate Other (  See Comments)   Sever chest pains " flat lined patient"   Mirapex [pramipexole Dihydrochloride]    Severe leg pain   Shellfish Allergy Anaphylaxis, Hives   Statins Other (See Comments)   All statins cause myalgias   Zolpidem Other (See Comments)   Chest pain   Levaquin [levofloxacin] Hives   Trazodone And Nefazodone Other (See Comments)   Unsteady on feet      Medication List    TAKE these medications   clonazePAM 0.5 MG tablet Commonly known as:  KLONOPIN Take 0.5 mg by mouth 3  (three) times daily as needed for anxiety.   diclofenac sodium 1 % Gel Commonly known as:  VOLTAREN Apply 1 application topically daily as needed (ankle pain).   diltiazem 120 MG 24 hr capsule Commonly known as:  CARDIZEM CD Take 1 capsule (120 mg total) by mouth daily.   ezetimibe 10 MG tablet Commonly known as:  ZETIA Take 1 tablet (10 mg total) by mouth daily.   fluticasone 50 MCG/ACT nasal spray Commonly known as:  FLONASE Place 1 spray into both nostrils daily as needed for allergies or rhinitis.   furosemide 20 MG tablet Commonly known as:  LASIX Take 1 tablet (20 mg total) by mouth daily. What changed:  when to take this   levothyroxine 125 MCG tablet Commonly known as:  SYNTHROID, LEVOTHROID Take 1 tablet (125 mcg total) by mouth daily before breakfast. Start taking on:  12/26/2016 What changed:  medication strength  See the new instructions.   methocarbamol 750 MG tablet Commonly known as:  ROBAXIN Take 750 mg by mouth 4 (four) times daily as needed for muscle spasms.   MURINE FOR RED EYES OP Place 1 drop into both eyes daily as needed (red eyes).   nitroGLYCERIN 0.4 MG SL tablet Commonly known as:  NITROSTAT PLACE 1 TABLET UNDER TONGUE EVERY 5 MINUTES AS NEEDED FOR CHEST PAIN   omeprazole 20 MG capsule Commonly known as:  PRILOSEC Take 1 capsule (20 mg total) by mouth daily. What changed:  when to take this  reasons to take this   Oxycodone HCl 20 MG Tabs Take 1 tablet (20 mg total) by mouth every 6 (six) hours as needed (severe pain).   polyethylene glycol packet Commonly known as:  MIRALAX / GLYCOLAX Take 17 g by mouth daily as needed for mild constipation.   rivaroxaban 20 MG Tabs tablet Commonly known as:  XARELTO Take 1 tablet (20 mg total) by mouth daily with supper.   ropinirole 5 MG tablet Commonly known as:  REQUIP Take 10 mg by mouth at bedtime.   triamcinolone cream 0.1 % Commonly known as:  KENALOG Apply 1 application topically  2 (two) times daily as needed (as directed for skin).   VITAMIN C PO Take 4 tablets by mouth daily as needed (flu like symptopms).       Disposition:  Discharge Instructions    Diet - low sodium heart healthy    Complete by:  As directed    Increase activity slowly    Complete by:  As directed      Follow-up Information    [facility] Follow up on 01/07/2017.   Specialty:  Cardiology Why:  at 12noon for wound check  Contact information: [address] [place]       Deboraha Sprang, MD Follow up on 03/28/2017.   Specialty:  Cardiology Why:  at 3:15PM  Contact  information: [address] [address] [phone]           Duration of Discharge Encounter: Greater than 30 minutes including physician time.  Signed, Chanetta Marshall, NP 12/25/2016 8:54 AM

## 2016-12-24 NOTE — Progress Notes (Signed)
This am notable for freq atrial and ventricular ectopy Will begin low dose dilt Also noted without repeat TSH in some time--will order

## 2016-12-25 ENCOUNTER — Telehealth: Payer: Self-pay | Admitting: Cardiology

## 2016-12-25 ENCOUNTER — Ambulatory Visit (HOSPITAL_COMMUNITY): Payer: Medicare Other

## 2016-12-25 DIAGNOSIS — Z86010 Personal history of colonic polyps: Secondary | ICD-10-CM | POA: Diagnosis not present

## 2016-12-25 DIAGNOSIS — E785 Hyperlipidemia, unspecified: Secondary | ICD-10-CM | POA: Diagnosis not present

## 2016-12-25 DIAGNOSIS — I4892 Unspecified atrial flutter: Secondary | ICD-10-CM | POA: Diagnosis not present

## 2016-12-25 DIAGNOSIS — I2510 Atherosclerotic heart disease of native coronary artery without angina pectoris: Secondary | ICD-10-CM | POA: Diagnosis not present

## 2016-12-25 DIAGNOSIS — I495 Sick sinus syndrome: Secondary | ICD-10-CM | POA: Diagnosis not present

## 2016-12-25 DIAGNOSIS — G4733 Obstructive sleep apnea (adult) (pediatric): Secondary | ICD-10-CM | POA: Diagnosis not present

## 2016-12-25 DIAGNOSIS — Z881 Allergy status to other antibiotic agents status: Secondary | ICD-10-CM | POA: Diagnosis not present

## 2016-12-25 DIAGNOSIS — Z888 Allergy status to other drugs, medicaments and biological substances status: Secondary | ICD-10-CM | POA: Diagnosis not present

## 2016-12-25 DIAGNOSIS — K219 Gastro-esophageal reflux disease without esophagitis: Secondary | ICD-10-CM | POA: Diagnosis not present

## 2016-12-25 DIAGNOSIS — I252 Old myocardial infarction: Secondary | ICD-10-CM | POA: Diagnosis not present

## 2016-12-25 DIAGNOSIS — Z452 Encounter for adjustment and management of vascular access device: Secondary | ICD-10-CM | POA: Diagnosis not present

## 2016-12-25 DIAGNOSIS — Z7982 Long term (current) use of aspirin: Secondary | ICD-10-CM | POA: Diagnosis not present

## 2016-12-25 DIAGNOSIS — E039 Hypothyroidism, unspecified: Secondary | ICD-10-CM | POA: Diagnosis not present

## 2016-12-25 DIAGNOSIS — I5032 Chronic diastolic (congestive) heart failure: Secondary | ICD-10-CM | POA: Diagnosis not present

## 2016-12-25 DIAGNOSIS — Z91013 Allergy to seafood: Secondary | ICD-10-CM | POA: Diagnosis not present

## 2016-12-25 DIAGNOSIS — I491 Atrial premature depolarization: Secondary | ICD-10-CM | POA: Diagnosis not present

## 2016-12-25 DIAGNOSIS — I110 Hypertensive heart disease with heart failure: Secondary | ICD-10-CM | POA: Diagnosis not present

## 2016-12-25 DIAGNOSIS — Z87891 Personal history of nicotine dependence: Secondary | ICD-10-CM | POA: Diagnosis not present

## 2016-12-25 DIAGNOSIS — Z79899 Other long term (current) drug therapy: Secondary | ICD-10-CM | POA: Diagnosis not present

## 2016-12-25 DIAGNOSIS — G2581 Restless legs syndrome: Secondary | ICD-10-CM | POA: Diagnosis not present

## 2016-12-25 DIAGNOSIS — Z7901 Long term (current) use of anticoagulants: Secondary | ICD-10-CM | POA: Diagnosis not present

## 2016-12-25 DIAGNOSIS — R55 Syncope and collapse: Secondary | ICD-10-CM | POA: Diagnosis not present

## 2016-12-25 MED ORDER — LEVOTHYROXINE SODIUM 125 MCG PO TABS: 125.0000 ug | ORAL_TABLET | Freq: Every day | ORAL | 0 refills | Status: DC

## 2016-12-25 MED ORDER — LEVOTHYROXINE SODIUM 25 MCG PO TABS
125.0000 ug | ORAL_TABLET | Freq: Every day | ORAL | Status: DC
  Administered 2016-12-25: 125 ug via ORAL
  Filled 2016-12-25: qty 1

## 2016-12-25 MED ORDER — DILTIAZEM HCL ER COATED BEADS 120 MG PO CP24: 120.0000 mg | ORAL_CAPSULE | Freq: Every day | ORAL | 1 refills | Status: DC

## 2016-12-25 NOTE — Telephone Encounter (Signed)
Patient daughter called and had some medication questions. Please call back at the listed home number.

## 2016-12-25 NOTE — Telephone Encounter (Signed)
I called and spoke with the patient's wife. She was inquiring if the patient could resume his hydrocodone as he was previously taking. Per [name], the patient was given tramadol in the hospital as his O2 sats dropped a little on oxycodone during his hospitalization.  I advised her that I do not see any documentation that he could not take oxycodone at discharge and this was actually on his discharge list of medications.  She is aware that it is ok to continue with his hydrocodone.

## 2016-12-25 NOTE — Progress Notes (Signed)
       Patient Name: [name]      SUBJECTIVE: nop chest pain Sleep well last night No sob  Past Medical History:  Diagnosis Date  . Adenomatous polyp of colon 2007  . Arthritis   . Atrial flutter ([place])   . AVM (arteriovenous malformation) 2011   a. S/p argon plasma coagulation and ablation in 2011, 2017.  . Bradycardia    a. H/o almost 7sec pause nocturnally during 2011 admission. Also has h/o fatigue with BB.  [name] CAD (coronary artery disease)    a. Nonobst disease by cath 2009. b. s/p PCI 10/2014 with DES to [place], patent by relook 11/2014 (Brilinta changed to Plavix with improved sx).  . Chronic diastolic CHF (congestive heart failure) ([place])   . Depression   . Diverticulosis   . Gastritis 2011  . GERD (gastroesophageal reflux disease)   . History of hiatal hernia   . Hyperlipidemia   . Hypertension   . Hypothyroidism   . Myocardial infarction ([place])   . Obstructive sleep apnea    -no cpap use  . Premature atrial contractions Holter 2016  . PVC's (premature ventricular contractions) Holter 2016  . Statin intolerance   . Transfusion history    several years ago -GI bleed    Scheduled Meds:  Scheduled Meds: . diltiazem  120 mg Oral Daily  . ezetimibe  10 mg Oral Daily  . [START ON 12/26/2016] levothyroxine  125 mcg Oral QAC breakfast  . pantoprazole  40 mg Oral Daily  . ropinirole  10 mg Oral QHS  . traMADol  25 mg Oral Q6H   Continuous Infusions: acetaminophen, clonazePAM, fluticasone, methocarbamol, ondansetron (ZOFRAN) IV, oxyCODONE, polyethylene glycol    PHYSICAL EXAM Vitals:   12/24/16 1100 12/24/16 1345 12/24/16 2100 12/25/16 0527  BP: (!) 133/91 136/66 107/70 118/76  Pulse:  77 72 61  Resp:  17 18 18   Temp:  97.9 F (36.6 C) 98.3 F (36.8 C) 97.9 F (36.6 C)  TempSrc:  Oral Oral Oral  SpO2:  96% 95% 95%  Weight:      Height:        Well developed and nourished in no acute distress HENT normal Neck supple with  JVP-flat Clear Device pocket well healed; without hematoma or erythema.  There is no tethering  Regular rate and rhythm, no murmurs or gallops Abd-soft with active BS No Clubbing cyanosis edema Skin-warm and dry A & Oriented  Grossly normal sensory and motor function   TELEMETRY: Reviewed personnally pt in  Sinus  And pacing:   Intake/Output Summary (Last 24 hours) at 12/25/16 0740 Last data filed at 12/25/16 0039  Gross per 24 hour  Intake              120 ml  Output              500 ml  Net             -380 ml    Thyroid Function Tests:  Recent Labs  12/24/16 0955  TSH 16.966*   Anemia Panel: No results for input(s): VITAMINB12, FOLATE, FERRITIN, TIBC, IRON, RETICCTPCT in the last 72 hours.   Device Interrogation: normal function*   ASSESSMENT AND PLAN:  Active Problems:   Hypothyroidism   Sinus node dysfunction (HCC)  TSH  16  Will increase synthroid 100>>125 Home today Instructions given   Signed, Virl Axe MD  12/25/2016

## 2016-12-26 ENCOUNTER — Telehealth: Payer: Self-pay | Admitting: Family Medicine

## 2016-12-26 MED ORDER — OXYCODONE HCL 20 MG PO TABS: 20.0000 mg | ORAL_TABLET | Freq: Four times a day (QID) | ORAL | 0 refills | Status: DC | PRN

## 2016-12-26 NOTE — Telephone Encounter (Signed)
Pt need new Rx for Oxycodone   Pt is aware of 3 business days for refills and someone will call when ready for pick up. °

## 2016-12-26 NOTE — Telephone Encounter (Signed)
Done

## 2016-12-27 NOTE — Telephone Encounter (Signed)
Script is ready for pick up here at front office and I spoke with pt.  

## 2017-01-02 ENCOUNTER — Other Ambulatory Visit: Payer: Self-pay | Admitting: Family Medicine

## 2017-01-02 NOTE — Telephone Encounter (Signed)
Call in #[phone] with one rf 

## 2017-01-05 ENCOUNTER — Other Ambulatory Visit: Payer: Self-pay | Admitting: Family Medicine

## 2017-01-07 ENCOUNTER — Encounter: Payer: Self-pay | Admitting: Physician Assistant

## 2017-01-07 ENCOUNTER — Ambulatory Visit: Payer: Medicare Other | Admitting: *Deleted

## 2017-01-07 LAB — CUP PACEART INCLINIC DEVICE CHECK
Brady Statistic AP VP Percent: 1.77 %
Brady Statistic AP VS Percent: 24.58 %
Brady Statistic AS VP Percent: 1.42 %
Brady Statistic RA Percent Paced: 30.13 %
Brady Statistic RV Percent Paced: 3.19 %
Date Time Interrogation Session: 20180618143558
Implantable Lead Implant Date: 20180604
Implantable Lead Implant Date: 20180604
Implantable Lead Location: 753859
Implantable Lead Model: 5076
Lead Channel Impedance Value: 418 Ohm
Lead Channel Impedance Value: 456 Ohm
Lead Channel Pacing Threshold Amplitude: 0.5 V
Lead Channel Pacing Threshold Pulse Width: 0.4 ms
Lead Channel Sensing Intrinsic Amplitude: 3.25 mV
Lead Channel Setting Pacing Amplitude: 3.5 V
Lead Channel Setting Sensing Sensitivity: 1.2 mV
MDC IDC LEAD LOCATION: 753860
MDC IDC MSMT BATTERY REMAINING LONGEVITY: 168 mo
MDC IDC MSMT BATTERY VOLTAGE: 3.22 V
MDC IDC MSMT LEADCHNL RA IMPEDANCE VALUE: 304 Ohm
MDC IDC MSMT LEADCHNL RA SENSING INTR AMPL: 4.75 mV
MDC IDC MSMT LEADCHNL RV IMPEDANCE VALUE: 570 Ohm
MDC IDC MSMT LEADCHNL RV PACING THRESHOLD AMPLITUDE: 0.875 V
MDC IDC MSMT LEADCHNL RV PACING THRESHOLD PULSEWIDTH: 0.4 ms
MDC IDC PG IMPLANT DT: 20180604
MDC IDC SET LEADCHNL RV PACING AMPLITUDE: 3.5 V
MDC IDC SET LEADCHNL RV PACING PULSEWIDTH: 0.4 ms
MDC IDC STAT BRADY AS VS PERCENT: 72.23 %
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
## 2017-01-07 NOTE — Telephone Encounter (Signed)
Can we refill this? Looks like pt was in hospital recently.

## 2017-01-08 ENCOUNTER — Observation Stay (HOSPITAL_COMMUNITY): Payer: Medicare Other

## 2017-01-08 ENCOUNTER — Encounter: Payer: Self-pay | Admitting: Physician Assistant

## 2017-01-08 ENCOUNTER — Encounter (HOSPITAL_COMMUNITY): Payer: Self-pay | Admitting: *Deleted

## 2017-01-08 ENCOUNTER — Observation Stay (HOSPITAL_BASED_OUTPATIENT_CLINIC_OR_DEPARTMENT_OTHER): Payer: Medicare Other

## 2017-01-08 ENCOUNTER — Ambulatory Visit (INDEPENDENT_AMBULATORY_CARE_PROVIDER_SITE_OTHER): Payer: Medicare Other | Admitting: Physician Assistant

## 2017-01-08 ENCOUNTER — Observation Stay (HOSPITAL_COMMUNITY)
Admission: AD | Admit: 2017-01-08 | Discharge: 2017-01-09 | Disposition: A | Discharge status: Home / Self Care | Payer: Medicare Other | Source: Ambulatory Visit | Attending: Cardiology | Admitting: Cardiology

## 2017-01-08 VITALS — BP 118/58 | HR 74 | Ht 70.0 in | Wt 230.8 lb

## 2017-01-08 DIAGNOSIS — J9811 Atelectasis: Secondary | ICD-10-CM | POA: Insufficient documentation

## 2017-01-08 DIAGNOSIS — Z91013 Allergy to seafood: Secondary | ICD-10-CM | POA: Diagnosis not present

## 2017-01-08 DIAGNOSIS — I4892 Unspecified atrial flutter: Secondary | ICD-10-CM

## 2017-01-08 DIAGNOSIS — R05 Cough: Secondary | ICD-10-CM

## 2017-01-08 DIAGNOSIS — I2510 Atherosclerotic heart disease of native coronary artery without angina pectoris: Secondary | ICD-10-CM

## 2017-01-08 DIAGNOSIS — I5032 Chronic diastolic (congestive) heart failure: Secondary | ICD-10-CM | POA: Insufficient documentation

## 2017-01-08 DIAGNOSIS — E039 Hypothyroidism, unspecified: Secondary | ICD-10-CM | POA: Diagnosis not present

## 2017-01-08 DIAGNOSIS — Z955 Presence of coronary angioplasty implant and graft: Secondary | ICD-10-CM | POA: Diagnosis not present

## 2017-01-08 DIAGNOSIS — Z87891 Personal history of nicotine dependence: Secondary | ICD-10-CM | POA: Insufficient documentation

## 2017-01-08 DIAGNOSIS — E785 Hyperlipidemia, unspecified: Secondary | ICD-10-CM | POA: Diagnosis not present

## 2017-01-08 DIAGNOSIS — R079 Chest pain, unspecified: Secondary | ICD-10-CM | POA: Diagnosis present

## 2017-01-08 DIAGNOSIS — M1990 Unspecified osteoarthritis, unspecified site: Secondary | ICD-10-CM | POA: Diagnosis not present

## 2017-01-08 DIAGNOSIS — Z7901 Long term (current) use of anticoagulants: Secondary | ICD-10-CM | POA: Diagnosis not present

## 2017-01-08 DIAGNOSIS — R0600 Dyspnea, unspecified: Secondary | ICD-10-CM | POA: Diagnosis present

## 2017-01-08 DIAGNOSIS — G4733 Obstructive sleep apnea (adult) (pediatric): Secondary | ICD-10-CM | POA: Diagnosis not present

## 2017-01-08 DIAGNOSIS — F329 Major depressive disorder, single episode, unspecified: Secondary | ICD-10-CM | POA: Diagnosis not present

## 2017-01-08 DIAGNOSIS — R7989 Other specified abnormal findings of blood chemistry: Secondary | ICD-10-CM | POA: Diagnosis not present

## 2017-01-08 DIAGNOSIS — K219 Gastro-esophageal reflux disease without esophagitis: Secondary | ICD-10-CM | POA: Insufficient documentation

## 2017-01-08 DIAGNOSIS — R0789 Other chest pain: Secondary | ICD-10-CM | POA: Diagnosis not present

## 2017-01-08 DIAGNOSIS — R778 Other specified abnormalities of plasma proteins: Secondary | ICD-10-CM

## 2017-01-08 DIAGNOSIS — Z9119 Patient's noncompliance with other medical treatment and regimen: Secondary | ICD-10-CM | POA: Diagnosis not present

## 2017-01-08 DIAGNOSIS — R072 Precordial pain: Secondary | ICD-10-CM

## 2017-01-08 DIAGNOSIS — I110 Hypertensive heart disease with heart failure: Secondary | ICD-10-CM | POA: Insufficient documentation

## 2017-01-08 DIAGNOSIS — I25119 Atherosclerotic heart disease of native coronary artery with unspecified angina pectoris: Secondary | ICD-10-CM | POA: Diagnosis present

## 2017-01-08 DIAGNOSIS — Z950 Presence of cardiac pacemaker: Secondary | ICD-10-CM | POA: Diagnosis not present

## 2017-01-08 DIAGNOSIS — R059 Cough, unspecified: Secondary | ICD-10-CM

## 2017-01-08 DIAGNOSIS — I252 Old myocardial infarction: Secondary | ICD-10-CM | POA: Diagnosis not present

## 2017-01-08 DIAGNOSIS — Z7951 Long term (current) use of inhaled steroids: Secondary | ICD-10-CM | POA: Diagnosis not present

## 2017-01-08 LAB — CBC
HEMOGLOBIN: 14.1 g/dL (ref 13.0–17.7)
Hematocrit: 41.8 % (ref 37.5–51.0)
MCH: 27.5 pg (ref 26.6–33.0)
MCHC: 33.7 g/dL (ref 31.5–35.7)
MCV: 82 fL (ref 79–97)
PLATELETS: 264 10*3/uL (ref 150–379)
RBC: 5.12 x10E6/uL (ref 4.14–5.80)
RDW: 15.2 % (ref 12.3–15.4)
WBC: 11.4 10*3/uL — ABNORMAL HIGH (ref 3.4–10.8)

## 2017-01-08 LAB — TROPONIN I
Troponin I: <0.03 ng/mL (ref <0.03)
Troponin I: <0.03 ng/mL (ref <0.03)

## 2017-01-08 LAB — BASIC METABOLIC PANEL
BUN / CREAT RATIO: 14 (ref 10–24)
BUN: 16 mg/dL (ref 8–27)
CO2: 27 mmol/L (ref 20–29)
CREATININE: 1.15 mg/dL (ref 0.76–1.27)
Calcium: 9.8 mg/dL (ref 8.6–10.2)
Chloride: 102 mmol/L (ref 96–106)
GFR calc non Af Amer: 62 mL/min/{1.73_m2} (ref >59)
GFR, EST AFRICAN AMERICAN: 72 mL/min/{1.73_m2} (ref >59)
Glucose: 106 mg/dL — ABNORMAL HIGH (ref 65–99)
Potassium: 4.6 mmol/L (ref 3.5–5.2)
SODIUM: 139 mmol/L (ref 134–144)

## 2017-01-08 LAB — TROPONIN T: Troponin T TROPT: 0.049 ng/mL (ref <0.011)

## 2017-01-08 LAB — HEPATIC FUNCTION PANEL
ALT: 22 U/L (ref 17–63)
AST: 22 U/L (ref 15–41)
Albumin: 3.9 g/dL (ref 3.5–5.0)
Alkaline Phosphatase: 58 U/L (ref 38–126)
Bilirubin, Direct: 0.2 mg/dL (ref 0.1–0.5)
Indirect Bilirubin: 0.8 mg/dL (ref 0.3–0.9)
Total Bilirubin: 1 mg/dL (ref 0.3–1.2)
Total Protein: 6.6 g/dL (ref 6.5–8.1)

## 2017-01-08 LAB — MRSA PCR SCREENING: MRSA by PCR: NEGATIVE

## 2017-01-08 LAB — BRAIN NATRIURETIC PEPTIDE: B Natriuretic Peptide: 25.9 pg/mL (ref 0.0–100.0)

## 2017-01-08 LAB — D-DIMER, QUANTITATIVE: D-Dimer, Quant: <0.27 ug{FEU}/mL (ref 0.00–0.50)

## 2017-01-08 MED ORDER — PANTOPRAZOLE SODIUM 40 MG PO TBEC
40.0000 mg | DELAYED_RELEASE_TABLET | Freq: Two times a day (BID) | ORAL | Status: DC
  Administered 2017-01-08 – 2017-01-09 (×2): 40 mg via ORAL
  Filled 2017-01-08 (×2): qty 1

## 2017-01-08 MED ORDER — EZETIMIBE 10 MG PO TABS
10.0000 mg | ORAL_TABLET | Freq: Every day | ORAL | Status: DC
  Administered 2017-01-09: 10 mg via ORAL
  Filled 2017-01-08: qty 1

## 2017-01-08 MED ORDER — LEVOTHYROXINE SODIUM 25 MCG PO TABS
125.0000 ug | ORAL_TABLET | Freq: Every day | ORAL | Status: DC
  Administered 2017-01-09: 125 ug via ORAL
  Filled 2017-01-08: qty 1

## 2017-01-08 MED ORDER — ROPINIROLE HCL 1 MG PO TABS: 10.0000 mg | ORAL_TABLET | Freq: Every day | ORAL | Status: DC

## 2017-01-08 MED ORDER — ONDANSETRON HCL 4 MG/2ML IJ SOLN: 4.0000 mg | Freq: Four times a day (QID) | INTRAMUSCULAR | Status: DC | PRN

## 2017-01-08 MED ORDER — ASPIRIN EC 81 MG PO TBEC
81.0000 mg | DELAYED_RELEASE_TABLET | Freq: Every day | ORAL | Status: DC
  Administered 2017-01-09: 81 mg via ORAL
  Filled 2017-01-08: qty 1

## 2017-01-08 MED ORDER — CLONAZEPAM 0.5 MG PO TABS
0.5000 mg | ORAL_TABLET | Freq: Three times a day (TID) | ORAL | Status: DC | PRN
  Administered 2017-01-09: 0.5 mg via ORAL
  Filled 2017-01-08: qty 1

## 2017-01-08 MED ORDER — ASPIRIN 81 MG PO CHEW
324.0000 mg | CHEWABLE_TABLET | ORAL | Status: AC
  Administered 2017-01-08: 324 mg via ORAL
  Filled 2017-01-08: qty 4

## 2017-01-08 MED ORDER — SODIUM CHLORIDE 0.9% FLUSH
3.0000 mL | Freq: Two times a day (BID) | INTRAVENOUS | Status: DC
  Administered 2017-01-08 – 2017-01-09 (×3): 3 mL via INTRAVENOUS

## 2017-01-08 MED ORDER — SODIUM CHLORIDE 0.9% FLUSH: 3.0000 mL | INTRAVENOUS | Status: DC | PRN

## 2017-01-08 MED ORDER — ROPINIROLE HCL 1 MG PO TABS
10.0000 mg | ORAL_TABLET | Freq: Every day | ORAL | Status: DC
  Administered 2017-01-08: 10 mg via ORAL
  Filled 2017-01-08: qty 10

## 2017-01-08 MED ORDER — METHOCARBAMOL 500 MG PO TABS: 750.0000 mg | ORAL_TABLET | Freq: Four times a day (QID) | ORAL | Status: DC | PRN

## 2017-01-08 MED ORDER — ACETAMINOPHEN 325 MG PO TABS
650.0000 mg | ORAL_TABLET | ORAL | Status: DC | PRN
  Administered 2017-01-09 (×3): 650 mg via ORAL
  Filled 2017-01-08 (×2): qty 2

## 2017-01-08 MED ORDER — FLUTICASONE PROPIONATE 50 MCG/ACT NA SUSP
1.0000 | Freq: Every day | NASAL | Status: DC | PRN
  Filled 2017-01-08: qty 16

## 2017-01-08 MED ORDER — SODIUM CHLORIDE 0.9 % IV SOLN: 250.0000 mL | INTRAVENOUS | Status: DC | PRN

## 2017-01-08 MED ORDER — FUROSEMIDE 20 MG PO TABS
20.0000 mg | ORAL_TABLET | ORAL | Status: DC
  Administered 2017-01-09: 20 mg via ORAL
  Filled 2017-01-08: qty 1

## 2017-01-08 MED ORDER — ASPIRIN 300 MG RE SUPP: 300.0000 mg | RECTAL | Status: AC

## 2017-01-08 MED ORDER — POLYETHYLENE GLYCOL 3350 17 G PO PACK: 17.0000 g | PACK | Freq: Every day | ORAL | Status: DC | PRN

## 2017-01-08 MED ORDER — DILTIAZEM HCL ER COATED BEADS 120 MG PO CP24
120.0000 mg | ORAL_CAPSULE | Freq: Every day | ORAL | Status: DC
  Administered 2017-01-09: 120 mg via ORAL
  Filled 2017-01-08: qty 1

## 2017-01-08 MED ORDER — NITROGLYCERIN 0.4 MG SL SUBL
0.4000 mg | SUBLINGUAL_TABLET | SUBLINGUAL | Status: DC | PRN
  Administered 2017-01-09: 0.4 mg via SUBLINGUAL
  Filled 2017-01-08: qty 1

## 2017-01-08 NOTE — Progress Notes (Signed)
  Echocardiogram 2D Echocardiogram has been performed.  Donata Clay 01/08/2017, 3:59 PM

## 2017-01-08 NOTE — Progress Notes (Signed)
I remained in clinic today but continued to follow labs peripherally - BNP, LFTs normal along with repeat troponin. D-dimer, echo, CXR still in process. Have touched base with Dr. Stanford Breed who is in the hospital on call this PM and he will continue to look out for results and will sign out to oncoming provider if outstanding info still in process. Dayna Dunn PA-C

## 2017-01-08 NOTE — Addendum Note (Signed)
Addended by: Della Goo C on: 01/08/2017 10:59 AM   Modules accepted: Orders

## 2017-01-08 NOTE — Progress Notes (Deleted)
Wrong note type chosen  

## 2017-01-08 NOTE — Progress Notes (Signed)
Plan discussed with Dr. Stanford Breed - see his addendum. Per our discussion, continue Xarelto for now while we trend enzymes and obtain data. Marrio Scribner PA-C
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
## 2017-01-08 NOTE — Patient Instructions (Signed)
Medication Instructions:  Your physician recommends that you continue on your current medications as directed. Please refer to the Current Medication list given to you today.   Labwork: Your physician recommends that you return for lab work today for STAT BMET, STAT CBC, STAT TROPONIN  Testing/Procedures: Your physician has requested that you have an echocardiogram. Echocardiography is a painless test that uses sound waves to create images of your heart. It provides your doctor with information about the size and shape of your heart and how well your heart's chambers and valves are working. This procedure takes approximately one hour. There are no restrictions for this procedure.    Follow-Up: Your physician recommends that you schedule a follow-up appointment in: 1 week with Dr. [name] or APP team.  Any Other Special Instructions Will Be Listed Below (If Applicable).     If you need a refill on your cardiac medications before your next appointment, please call your pharmacy.

## 2017-01-08 NOTE — Addendum Note (Signed)
Addended by: Stanton Kidney on: 01/08/2017 12:19 PM   Modules accepted: Orders

## 2017-01-09 ENCOUNTER — Observation Stay (HOSPITAL_BASED_OUTPATIENT_CLINIC_OR_DEPARTMENT_OTHER): Payer: Medicare Other

## 2017-01-09 DIAGNOSIS — K219 Gastro-esophageal reflux disease without esophagitis: Secondary | ICD-10-CM | POA: Diagnosis not present

## 2017-01-09 DIAGNOSIS — Z91013 Allergy to seafood: Secondary | ICD-10-CM | POA: Diagnosis not present

## 2017-01-09 DIAGNOSIS — I2510 Atherosclerotic heart disease of native coronary artery without angina pectoris: Secondary | ICD-10-CM | POA: Diagnosis not present

## 2017-01-09 DIAGNOSIS — I5032 Chronic diastolic (congestive) heart failure: Secondary | ICD-10-CM | POA: Diagnosis not present

## 2017-01-09 DIAGNOSIS — Z7901 Long term (current) use of anticoagulants: Secondary | ICD-10-CM | POA: Diagnosis not present

## 2017-01-09 DIAGNOSIS — Z955 Presence of coronary angioplasty implant and graft: Secondary | ICD-10-CM | POA: Diagnosis not present

## 2017-01-09 DIAGNOSIS — Z950 Presence of cardiac pacemaker: Secondary | ICD-10-CM | POA: Diagnosis not present

## 2017-01-09 DIAGNOSIS — I110 Hypertensive heart disease with heart failure: Secondary | ICD-10-CM | POA: Diagnosis not present

## 2017-01-09 DIAGNOSIS — I4892 Unspecified atrial flutter: Secondary | ICD-10-CM | POA: Diagnosis not present

## 2017-01-09 DIAGNOSIS — R079 Chest pain, unspecified: Secondary | ICD-10-CM | POA: Diagnosis not present

## 2017-01-09 DIAGNOSIS — I252 Old myocardial infarction: Secondary | ICD-10-CM | POA: Diagnosis not present

## 2017-01-09 DIAGNOSIS — J9811 Atelectasis: Secondary | ICD-10-CM | POA: Diagnosis not present

## 2017-01-09 DIAGNOSIS — Z7951 Long term (current) use of inhaled steroids: Secondary | ICD-10-CM | POA: Diagnosis not present

## 2017-01-09 DIAGNOSIS — M1990 Unspecified osteoarthritis, unspecified site: Secondary | ICD-10-CM | POA: Diagnosis not present

## 2017-01-09 DIAGNOSIS — R072 Precordial pain: Secondary | ICD-10-CM | POA: Diagnosis not present

## 2017-01-09 DIAGNOSIS — E039 Hypothyroidism, unspecified: Secondary | ICD-10-CM | POA: Diagnosis not present

## 2017-01-09 DIAGNOSIS — Z87891 Personal history of nicotine dependence: Secondary | ICD-10-CM | POA: Diagnosis not present

## 2017-01-09 DIAGNOSIS — E785 Hyperlipidemia, unspecified: Secondary | ICD-10-CM | POA: Diagnosis not present

## 2017-01-09 DIAGNOSIS — G4733 Obstructive sleep apnea (adult) (pediatric): Secondary | ICD-10-CM | POA: Diagnosis not present

## 2017-01-09 DIAGNOSIS — Z9119 Patient's noncompliance with other medical treatment and regimen: Secondary | ICD-10-CM | POA: Diagnosis not present

## 2017-01-09 DIAGNOSIS — R7989 Other specified abnormal findings of blood chemistry: Secondary | ICD-10-CM | POA: Diagnosis not present

## 2017-01-09 DIAGNOSIS — R0789 Other chest pain: Secondary | ICD-10-CM | POA: Diagnosis not present

## 2017-01-09 LAB — CBC
HEMATOCRIT: 44.2 % (ref 39.0–52.0)
HEMOGLOBIN: 13.4 g/dL (ref 13.0–17.0)
MCH: 27.1 pg (ref 26.0–34.0)
MCHC: 30.3 g/dL (ref 30.0–36.0)
MCV: 89.3 fL (ref 78.0–100.0)
Platelets: 239 10*3/uL (ref 150–400)
RBC: 4.95 MIL/uL (ref 4.22–5.81)
RDW: 14.8 % (ref 11.5–15.5)
WBC: 9.4 10*3/uL (ref 4.0–10.5)

## 2017-01-09 LAB — BASIC METABOLIC PANEL
ANION GAP: 6 (ref 5–15)
BUN: 13 mg/dL (ref 6–20)
CALCIUM: 8.8 mg/dL — AB (ref 8.9–10.3)
CO2: 29 mmol/L (ref 22–32)
Chloride: 103 mmol/L (ref 101–111)
Creatinine, Ser: 1.12 mg/dL (ref 0.61–1.24)
GFR calc Af Amer: >60 mL/min (ref >60)
GFR calc non Af Amer: >60 mL/min (ref >60)
GLUCOSE: 121 mg/dL — AB (ref 65–99)
POTASSIUM: 4.4 mmol/L (ref 3.5–5.1)
Sodium: 138 mmol/L (ref 135–145)

## 2017-01-09 LAB — ECHOCARDIOGRAM COMPLETE
Height: 70 in
Weight: 3692.8 oz

## 2017-01-09 LAB — NM MYOCAR MULTI W/SPECT W/WALL MOTION / EF
Exercise duration (min): 5 min
Exercise duration (sec): 1 s
Peak HR: 88 {beats}/min
Rest HR: 65 {beats}/min

## 2017-01-09 LAB — TROPONIN I

## 2017-01-09 MED ORDER — REGADENOSON 0.4 MG/5ML IV SOLN
INTRAVENOUS | Status: AC
  Administered 2017-01-09: 0.4 mg via INTRAVENOUS
  Filled 2017-01-09: qty 5

## 2017-01-09 MED ORDER — TECHNETIUM TC 99M TETROFOSMIN IV KIT
10.0000 | PACK | Freq: Once | INTRAVENOUS | Status: AC | PRN
  Administered 2017-01-09: 10 via INTRAVENOUS

## 2017-01-09 MED ORDER — NITROGLYCERIN 0.4 MG SL SUBL: 0.4000 mg | SUBLINGUAL_TABLET | SUBLINGUAL | 12 refills | Status: DC | PRN

## 2017-01-09 MED ORDER — REGADENOSON 0.4 MG/5ML IV SOLN
0.4000 mg | Freq: Once | INTRAVENOUS | Status: AC
  Administered 2017-01-09: 0.4 mg via INTRAVENOUS
  Filled 2017-01-09: qty 5

## 2017-01-09 MED ORDER — ACETAMINOPHEN 325 MG PO TABS
ORAL_TABLET | ORAL | Status: AC
  Administered 2017-01-09: 650 mg via ORAL
  Filled 2017-01-09: qty 2

## 2017-01-09 MED ORDER — TECHNETIUM TC 99M TETROFOSMIN IV KIT
30.0000 | PACK | Freq: Once | INTRAVENOUS | Status: AC | PRN
  Administered 2017-01-09: 30 via INTRAVENOUS

## 2017-01-09 NOTE — Progress Notes (Signed)
After talking on the phone with son about family stressors patient began to have some anxiety and a headache-gave patient PRN tylenol and klonopin, patient then started to have complaints of chest pain, sublingual nitro given and pain resolved within 5 minutes, EKG obtained-no new changes, patient to go down for a stress test-SWAT RN to accompany patient. VS stable, patient currently resting comfortable, call bell in reach.  Rowe Pavy, RN

## 2017-01-09 NOTE — Discharge Summary (Signed)
Discharge Summary    Patient ID: [name],  MRN: [id number], DOB/AGE: 1941-02-15 76 y.o.  Admit date: 01/08/2017 Discharge date: 01/09/2017  Primary Care Provider: [name] A Primary Cardiologist: Dr. [name]  Discharge Diagnoses    Active Problems:   Coronary artery disease   Dyspnea   Chest pain   Elevated troponin   Cough   Paroxysmal atrial flutter (HCC)   Allergies Allergies  Allergen Reactions  . Brilinta [Ticagrelor] Shortness Of Breath  . Metoprolol Tartrate Other (See Comments)    Severe chest pains " flat lined patient"  . Mirapex [Pramipexole Dihydrochloride]     Severe leg pain  . Shellfish Allergy Anaphylaxis and Hives  . Statins Other (See Comments)    All statins cause myalgias   . Zolpidem Other (See Comments)    Chest pain  . Levaquin [Levofloxacin] Hives  . Trazodone And Nefazodone Other (See Comments)    Unsteady on feet    Diagnostic Studies/Procedures    Myocardial perfusion imaging 01/09/17   Study Result    There was no ST segment deviation noted during stress.  Defect 1: There is a medium defect of moderate severity.  Findings consistent with prior myocardial infarction.  This is an intermediate risk study.  Nuclear stress EF: 42%.   Large size, moderate severity fixed apical, anteroapical and inferoapical perfusion defect suggestive of scar or less likely artifact. No significant reversible ischemia. LVEF 42% with inferoapical and apical hypokinesis. This is an intermediate risk study.  _____________   Echocardiogram 01/08/17  Study Conclusions  - Left ventricle: The cavity size was normal. Wall thickness was   increased in a pattern of mild LVH. Systolic function was normal.   The estimated ejection fraction was in the range of 60% to 65%.   Wall motion was normal; there were no regional wall motion   abnormalities. Doppler parameters are consistent with abnormal   left ventricular relaxation (grade 1  diastolic dysfunction). - Aortic valve: Transvalvular velocity was within the normal range.   There was no stenosis. There was no regurgitation. - Mitral valve: Mildly calcified annulus. Transvalvular velocity   was within the normal range. There was no evidence for stenosis.   There was no regurgitation. - Left atrium: The atrium was mildly dilated. - Right ventricle: The cavity size was normal. Wall thickness was   normal. Systolic function was normal. - Atrial septum: No defect or patent foramen ovale was identified. - Tricuspid valve: There was trivial regurgitation.  History of Present Illness     76 year old male with past medical history of coronary artery disease, atrial flutter, bradycardia status post recent pacemaker, hypertension, hyperlipidemia, chronic diastolic congestive heart failure admitted with chest pain and dyspnea. Last nuclear study January 2018 showed ejection fraction 43% and no ischemia or infarction. Echocardiogram January 2018 showed normal LV function, grade 1 diastolic dysfunction. Patient had recently been diagnosed with atrial flutter and also had pacemaker implanted 12/21/2016.  The patient had presented to our office yesterday and had complaints of shortness of breath and intermittent chest pressure over the last several weeks. He also reports that he has had tremendous stress at home with family health problems and difficulties with the situation with his granddaughter. He was sent to [facility] for further evaluation.  Hospital Course     Consultants: None  The patient's symptom description was atypical for ischemia. Cardiac enzymes were negative. Echocardiogram showed normal LV function and no regional wall motion abnormalities, no  pericardial effusions. D-dimer was negative and BNP was normal. The patient was not fluid overloaded on exam. Recent pacemaker interrogation was noted to be normal. The patient had a stress test today which showed a  large size, moderate severity fixed apical, anterior apical and inferior apical perfusion defects suggestive of scar or less likely artifact. No significant reversible ischemia. The images were reviewed with Dr. Meda Coffee who agrees that there is no significant ischemia present and the patient can be discharged.   The patient is anticoagulated for history of atrial flutter and is on Cardizem maintaining sinus rhythm. He has a follow-up at electrophysiology appointment for consideration of ablation as an outpatient.   The patient is not currently on aspirin due to need for anticoagulation. He is also statin intolerant. Continue Zetia His blood pressure is well controlled and will continue his present blood pressure medications.  I discussed the results with the patient and his wife. We also discussed lifestyle modifications including stress management, heart healthy eating, and mild exercise. We also discussed use of sublingual nitroglycerin and went to seek immediate medical attention.  Patient has been seen by Dr. Stanford Breed today and deemed ready for discharge home. All follow up appointments have been scheduled. Discharge medications are listed below. _____________  Discharge Vitals Blood pressure 112/73, pulse 78, temperature 98.6 F (37 C), temperature source Oral, resp. rate (!) 23, height 5\' 10"  (1.778 m), weight 232 lb (105.2 kg), SpO2 93 %.  Filed Weights   01/08/17 1800  Weight: 232 lb (105.2 kg)    Labs & Radiologic Studies    CBC  Recent Labs  01/08/17 1103 01/09/17 0348  WBC 11.4* 9.4  HGB 14.1 13.4  HCT 41.8 44.2  MCV 82 89.3  PLT 264 703   Basic Metabolic Panel  Recent Labs  01/08/17 1103 01/09/17 0348  NA 139 138  K 4.6 4.4  CL 102 103  CO2 27 29  GLUCOSE 106* 121*  BUN 16 13  CREATININE 1.15 1.12  CALCIUM 9.8 8.8*   Liver Function Tests  Recent Labs  01/08/17 1441  AST 22  ALT 22  ALKPHOS 58  BILITOT 1.0  PROT 6.6  ALBUMIN 3.9   No results for  input(s): LIPASE, AMYLASE in the last 72 hours. Cardiac Enzymes  Recent Labs  01/08/17 1441 01/08/17 1932 01/09/17 0348  TROPONINI <0.03 <0.03 <0.03   BNP Invalid input(s): POCBNP D-Dimer  Recent Labs  01/08/17 1614  DDIMER <0.27   Hemoglobin A1C No results for input(s): HGBA1C in the last 72 hours. Fasting Lipid Panel No results for input(s): CHOL, HDL, LDLCALC, TRIG, CHOLHDL, LDLDIRECT in the last 72 hours. Thyroid Function Tests No results for input(s): TSH, T4TOTAL, T3FREE, THYROIDAB in the last 72 hours.  Invalid input(s): FREET3 _____________  X-ray Chest Pa And Lateral  Result Date: 01/08/2017 CLINICAL DATA:  Chest pain, history of cardiac pacemaker, CHF, hypertension, coronary artery disease post MI, atrial flutter, hiatal hernia EXAM: CHEST  2 VIEW COMPARISON:  12/25/2016 FINDINGS: LEFT subclavian transvenous pacemaker with leads grossly unchanged. Enlargement of cardiac silhouette with slight pulmonary vascular congestion. Mediastinal contours stable. Bibasilar atelectasis. Lungs otherwise clear. No acute infiltrate, pleural effusion or pneumothorax. No acute osseous findings. IMPRESSION: Bibasilar atelectasis. Enlargement of cardiac silhouette post pacemaker. Electronically Signed   By: Lavonia Dana M.D.   On: 01/08/2017 17:48   Dg Chest 2 View  Result Date: 12/25/2016 CLINICAL DATA:  Status post pacemaker placement. The patient reports shoulder discomfort and dizziness today. EXAM: CHEST  2 VIEW COMPARISON:  Chest x-ray of June 10, 2016 FINDINGS: The lungs are adequately inflated. The interstitial markings are coarse but this is not a new finding. There is stable scarring at the right lung base. There is no pneumothorax or pleural effusion. The cardiac silhouette is enlarged but stable. The pulmonary vascularity is not engorged. The pacemaker electrodes are in reasonable position. The generator overlies the left pectoral region. The bony thorax exhibits no acute  abnormality. IMPRESSION: No acute postprocedure complication following pacemaker placement. Chronic interstitial prominence may reflect the patient's smoking history. Stable cardiomegaly without pulmonary edema. Thoracic aortic atherosclerosis. Electronically Signed   By: David  Martinique M.D.   On: 12/25/2016 07:35   Nm Myocar Multi W/spect W/wall Motion / Ef  Result Date: 01/09/2017  There was no ST segment deviation noted during stress.  Defect 1: There is a medium defect of moderate severity.  Findings consistent with prior myocardial infarction.  This is an intermediate risk study.  Nuclear stress EF: 42%.  Large size, moderate severity fixed apical, anteroapical and inferoapical perfusion defect suggestive of scar or less likely artifact. No significant reversible ischemia. LVEF 42% with inferoapical and apical hypokinesis. This is an intermediate risk study.   Disposition   Pt is being discharged home today in good condition.  Follow-up Plans & Appointments    Follow-up Information    Burnell Blanks, MD Follow up.   Specialty:  Cardiology Why:  on July 12 at 8:40 for cardiology.  Contact information: [address]  [phone]          Discharge Instructions    Diet - low sodium heart healthy    Complete by:  As directed    Increase activity slowly    Complete by:  As directed       Discharge Medications   Current Discharge Medication List    CONTINUE these medications which have CHANGED   Details  nitroGLYCERIN (NITROSTAT) 0.4 MG SL tablet Place 1 tablet (0.4 mg total) under the tongue every 5 (five) minutes as needed for chest pain. Qty: 25 tablet, Refills: 12      CONTINUE these medications which have NOT CHANGED   Details  Ascorbic Acid (VITAMIN C PO) Take 4 tablets by mouth daily as needed (flu like symptopms).     clonazePAM (KLONOPIN) 0.5 MG tablet TAKE 1 TABLET 3 TIMES DAILY AS NEEDED Qty: 270 tablet,  Refills: 1    diclofenac sodium (VOLTAREN) 1 % GEL Apply 1 application topically daily as needed (ankle pain). Qty: 5 Tube, Refills: 10    diltiazem (CARDIZEM CD) 120 MG 24 hr capsule Take 1 capsule (120 mg total) by mouth daily. Qty: 90 capsule, Refills: 1    ezetimibe (ZETIA) 10 MG tablet Take 1 tablet (10 mg total) by mouth daily. Qty: 30 tablet, Refills: 11    fluticasone (FLONASE) 50 MCG/ACT nasal spray Place 1 spray into both nostrils daily as needed for allergies or rhinitis. Qty: 48 g, Refills: 4    furosemide (LASIX) 20 MG tablet Take 20 mg by mouth every other day.    levothyroxine (SYNTHROID, LEVOTHROID) 125 MCG tablet Take 1 tablet (125 mcg total) by mouth daily before breakfast. Qty: 90 tablet, Refills: 0    methocarbamol (ROBAXIN) 750 MG tablet Take 750 mg by mouth 4 (four) times daily as needed for muscle spasms.     omeprazole (PRILOSEC) 20 MG capsule TAKE ONE CAPSULE BY MOUTH TWICE A DAY Qty: 60  capsule, Refills: 11    Oxycodone HCl 20 MG TABS Take 1 tablet (20 mg total) by mouth every 6 (six) hours as needed (severe pain). Qty: 120 tablet, Refills: 0    polyethylene glycol (MIRALAX / GLYCOLAX) packet Take 17 g by mouth daily as needed for mild constipation.    rivaroxaban (XARELTO) 20 MG TABS tablet Take 1 tablet (20 mg total) by mouth daily with supper. Qty: 30 tablet, Refills: 6   Associated Diagnoses: New onset atrial flutter (HCC)    ropinirole (REQUIP) 5 MG tablet Take 10 mg by mouth at bedtime. Refills: 5    Tetrahydrozoline HCl (MURINE FOR RED EYES OP) Place 1 drop into both eyes daily as needed (red eyes).    triamcinolone cream (KENALOG) 0.1 % Apply 1 application topically 2 (two) times daily as needed (as directed for skin).          Outstanding Labs/Studies   None  Duration of Discharge Encounter   Greater than 30 minutes including physician time.  Signed, Daune Perch NP 01/09/2017, 4:51 PM

## 2017-01-09 NOTE — Progress Notes (Signed)
Called and spoke with Dr. Stanford Breed, gave verbal orders for patient to leave floor without a nurse for the stress test and is able to go down in a wheelchair.  Rowe Pavy, RN

## 2017-01-09 NOTE — Progress Notes (Signed)
Transported patient to nuclear medicine for stress test. Patient stable on arrival.

## 2017-01-09 NOTE — Progress Notes (Signed)
Patient transported back in room, stable on arrival.

## 2017-01-09 NOTE — Progress Notes (Signed)
    Patient presented for Lexiscan nuclear stress test. Tolerated procedure well. Pending final stress imaging result.  Daune Perch, AGNP-C 01/09/2017  1:02 PM Pager: [phone]

## 2017-01-09 NOTE — Progress Notes (Signed)
Progress Note  Patient Name: [name] Date of Encounter: 01/09/2017  Primary Cardiologist: Dr [name]  Subjective   Denies dyspnea or chest pain  Inpatient Medications    Scheduled Meds: . aspirin EC  81 mg Oral Daily  . diltiazem  120 mg Oral Daily  . ezetimibe  10 mg Oral Daily  . furosemide  20 mg Oral QODAY  . levothyroxine  125 mcg Oral QAC breakfast  . pantoprazole  40 mg Oral BID  . ropinirole  10 mg Oral QHS  . sodium chloride flush  3 mL Intravenous Q12H   Continuous Infusions: . sodium chloride     PRN Meds: sodium chloride, acetaminophen, clonazePAM, fluticasone, methocarbamol, nitroGLYCERIN, ondansetron (ZOFRAN) IV, polyethylene glycol, sodium chloride flush   Vital Signs    Vitals:   01/09/17 0350 01/09/17 0400 01/09/17 0500 01/09/17 0600  BP:  115/74 115/60 115/60  Pulse:  69 64 68  Resp:  (!) 21 13 (!) 24  Temp: 97.6 F (36.4 C)     TempSrc: Oral     SpO2:  93% 92% 90%  Weight:      Height:        Intake/Output Summary (Last 24 hours) at 01/09/17 0716 Last data filed at 01/09/17 0400  Gross per 24 hour  Intake             1050 ml  Output              750 ml  Net              300 ml   Filed Weights   01/08/17 1800  Weight: 105.2 kg (232 lb)    Telemetry    Sinus with pacs and pvcs- Personally Reviewed    Physical Exam   GEN: No acute distress.   Neck: No JVD Cardiac: RRR, no murmurs, rubs, or gallops.  Respiratory: Clear to auscultation bilaterally. GI: Soft, nontender, non-distended  MS: No edema Neuro:  Nonfocal  Psych: Normal affect   Labs    Chemistry Recent Labs Lab 01/08/17 1103 01/08/17 1441 01/09/17 0348  NA 139  --  138  K 4.6  --  4.4  CL 102  --  103  CO2 27  --  29  GLUCOSE 106*  --  121*  BUN 16  --  13  CREATININE 1.15  --  1.12  CALCIUM 9.8  --  8.8*  PROT  --  6.6  --   ALBUMIN  --  3.9  --   AST  --  22  --   ALT  --  22  --   ALKPHOS  --  58  --   BILITOT  --  1.0  --   GFRNONAA  62  --  >60  GFRAA 72  --  >60  ANIONGAP  --   --  6     Hematology Recent Labs Lab 01/08/17 1103 01/09/17 0348  WBC 11.4* 9.4  RBC 5.12 4.95  HGB 14.1 13.4  HCT 41.8 44.2  MCV 82 89.3  MCH 27.5 27.1  MCHC 33.7 30.3  RDW 15.2 14.8  PLT 264 239    Cardiac Enzymes Recent Labs Lab 01/08/17 1441 01/08/17 1932 01/09/17 0348  TROPONINI <0.03 <0.03 <0.03    BNP Recent Labs Lab 01/08/17 1441  BNP 25.9     DDimer  Recent Labs Lab 01/08/17 1614  DDIMER <0.27     Radiology    X-ray Chest Pa And  Lateral  Result Date: 01/08/2017 CLINICAL DATA:  Chest pain, history of cardiac pacemaker, CHF, hypertension, coronary artery disease post MI, atrial flutter, hiatal hernia EXAM: CHEST  2 VIEW COMPARISON:  12/25/2016 FINDINGS: LEFT subclavian transvenous pacemaker with leads grossly unchanged. Enlargement of cardiac silhouette with slight pulmonary vascular congestion. Mediastinal contours stable. Bibasilar atelectasis. Lungs otherwise clear. No acute infiltrate, pleural effusion or pneumothorax. No acute osseous findings. IMPRESSION: Bibasilar atelectasis. Enlargement of cardiac silhouette post pacemaker. Electronically Signed   By: Lavonia Dana M.D.   On: 01/08/2017 17:48    Patient Profile     76 year old male with past medical history of coronary artery disease, atrial flutter, bradycardia status post recent pacemaker, hypertension, hyperlipidemia, chronic diastolic congestive heart failure admitted with chest pain and dyspnea. Last nuclear study January 2018 showed ejection fraction 43% and no ischemia or infarction. Echocardiogram January 2018 showed normal LV function, grade 1 diastolic dysfunction. Patient had recently been diagnosed with atrial flutter and also had pacemaker implanted 12/21/2016.   Assessment & Plan    1 chest pain-etiology unclear. His symptoms are extremely atypical for ischemia. FU enzymes negative. Will arrange stress nuclear study; if negative, DC  later today and fu with Dr [name]. Echo shows normal LV function and no pericardial effusion. DDimer neg. BNP normal.   2 Dyspnea-etiology also unclear. Ddimer neg and BNP normal. Not volume overloaded on exam. Chest xray negative. Continue present dose of Lasix.  3 history of atrial flutter-we will continue anticoagulation and cardizem. He remains in sinus rhythm. Follow-up electrophysiology for consideration of ablation as outpt.   4 Status post recent pacemaker-recent interrogation normal.  5 coronary artery disease-no aspirin given need for anticoagulation. Statin intolerant.   6 hypertension-BP controlled; continue present blood pressure medications and follow.  7 hyperlipidemia-continue Zetia. Statin intolerant  Signed, Kirk Ruths, MD  01/09/2017, 7:16 AM

## 2017-01-09 NOTE — Addendum Note (Signed)
Addended by: Charlie Pitter on: 01/09/2017 12:08 PM   Modules accepted: Level of Service

## 2017-01-11 ENCOUNTER — Encounter: Payer: Self-pay | Admitting: *Deleted

## 2017-01-14 ENCOUNTER — Telehealth: Payer: Self-pay | Admitting: Cardiovascular Disease

## 2017-01-14 ENCOUNTER — Ambulatory Visit (INDEPENDENT_AMBULATORY_CARE_PROVIDER_SITE_OTHER): Payer: Medicare Other | Admitting: Cardiovascular Disease

## 2017-01-14 ENCOUNTER — Encounter (INDEPENDENT_AMBULATORY_CARE_PROVIDER_SITE_OTHER): Payer: Self-pay

## 2017-01-14 ENCOUNTER — Encounter: Payer: Self-pay | Admitting: Cardiovascular Disease

## 2017-01-14 VITALS — BP 128/70 | HR 65 | Ht 70.0 in | Wt 231.0 lb

## 2017-01-14 DIAGNOSIS — I25118 Atherosclerotic heart disease of native coronary artery with other forms of angina pectoris: Secondary | ICD-10-CM | POA: Diagnosis not present

## 2017-01-14 DIAGNOSIS — E7800 Pure hypercholesterolemia, unspecified: Secondary | ICD-10-CM | POA: Diagnosis not present

## 2017-01-14 DIAGNOSIS — I4892 Unspecified atrial flutter: Secondary | ICD-10-CM | POA: Diagnosis not present

## 2017-01-14 DIAGNOSIS — I5032 Chronic diastolic (congestive) heart failure: Secondary | ICD-10-CM | POA: Diagnosis not present

## 2017-01-14 DIAGNOSIS — I495 Sick sinus syndrome: Secondary | ICD-10-CM | POA: Diagnosis not present

## 2017-01-14 DIAGNOSIS — R5383 Other fatigue: Secondary | ICD-10-CM | POA: Diagnosis not present

## 2017-01-14 NOTE — Telephone Encounter (Signed)
New Message  Pt call requesting to speak with RN. Pt states he got pacemaker placed and now is having dizziness and feeling weak. Pt states his stool is black as well. Please call back to discuss

## 2017-01-14 NOTE — Telephone Encounter (Signed)
Reviewed with Dr. [name] and he can see pt this afternoon.  I spoke with pt and scheduled him to see Dr. [name] today at 4:00.

## 2017-01-14 NOTE — Patient Instructions (Addendum)
Medication Instructions:  Your physician recommends that you continue on your current medications as directed. Please refer to the Current Medication list given to you today.   Labwork: none  Testing/Procedures: none  Follow-Up: Follow up with Dr. [name] as planned on July 12,2018  Any Other Special Instructions Will Be Listed Below (If Applicable).     If you need a refill on your cardiac medications before your next appointment, please call your pharmacy.
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
## 2017-01-14 NOTE — Telephone Encounter (Signed)
New Message ° ° pt verbalized that he is returning call for rn  °

## 2017-01-14 NOTE — Telephone Encounter (Signed)
Left message on mobile and home numbers to call office.

## 2017-01-14 NOTE — Telephone Encounter (Signed)
I spoke with pt who reports dizziness and weakness. He says his legs are weak and he can barely walk. Only has leg pain at night. Also has shortness of breath with activity. Swelling in feet and ankles but this has not increased recently.  Was recently hospitalized and states he feels no worse now than prior to admission.  States he did not feel well when  discharged.  Reports he did not get up much at all when he was in the hospital and did not walk around.  He reports chest pain at times.  Describes as feeling like an ice pick.  Also reports dark stools since starting Xarelto.  Did not have BM when in hospital.  Is not taking iron.  Will review with Dr. [name].

## 2017-01-16 ENCOUNTER — Ambulatory Visit (INDEPENDENT_AMBULATORY_CARE_PROVIDER_SITE_OTHER): Payer: Medicare Other | Admitting: Family Medicine

## 2017-01-16 ENCOUNTER — Encounter: Payer: Self-pay | Admitting: Family Medicine

## 2017-01-16 VITALS — BP 130/70 | HR 68 | Temp 98.0°F | Wt 231.8 lb

## 2017-01-16 DIAGNOSIS — E039 Hypothyroidism, unspecified: Secondary | ICD-10-CM

## 2017-01-16 DIAGNOSIS — R42 Dizziness and giddiness: Secondary | ICD-10-CM

## 2017-01-16 DIAGNOSIS — I495 Sick sinus syndrome: Secondary | ICD-10-CM

## 2017-01-16 DIAGNOSIS — I4892 Unspecified atrial flutter: Secondary | ICD-10-CM | POA: Diagnosis not present

## 2017-01-16 NOTE — Progress Notes (Signed)
Subjective:     Patient ID: [name], male   DOB: August 03, 1940, 76 y.o.   MRN: [id number]  HPI Patient seen by me today in absence of his primary provider who is on vacation. He has very complicated past history with multiple problems including history of hypertension, CAD, atrial flutter, sinus node dysfunction, obstructive sleep apnea, hypothyroidism, hyperlipidemia. He had recent pacemaker regarding his sinus node dysfunction secondary to severe bradycardia. He has hypothyroidism and recent TSH a few weeks ago 16.9. Levothyroxine was increased apparently from 100 to 125 g daily. He was here today initially requesting repeat TSH level.  He complains of general fatigue and feeling lightheaded. This seems to be more positional. No syncope. No chest pain. He has some chronic dyspnea which is unchanged. He had recent echo June 19 which showed ejection fraction 60-65%. Previous carotid Dopplers a year ago showed 1-39% ICA stenosis bilaterally. No focal weakness.  Has had multiple recent labs which were all reviewed. These include CBC with hemoglobin stable at 13.4 week ago.  Patient had nuclear stress test 08/07/2016 which showed no ischemia. He's had close follow-up with cardiology. Denies any vertigo. He thinks his lightheaded symptoms have worsened since starting Xarelto. No obvious bleeding complications.  Past Medical History:  Diagnosis Date  . Adenomatous polyp of colon 2007  . Arthritis   . Atrial flutter ([place])   . AVM (arteriovenous malformation) 2011   a. S/p argon plasma coagulation and ablation in 2011, 2017.  . Bradycardia    a. H/o almost 7sec pause nocturnally during 2011 admission. Also has h/o fatigue with BB.  [name] CAD (coronary artery disease)    a. Nonobst disease by cath 2009. b. s/p PCI 10/2014 with DES to [place], patent by relook 11/2014 (Brilinta changed to Plavix with improved sx).  . Chronic diastolic CHF (congestive heart failure) ([place])   . Depression   .  Diverticulosis   . Gastritis 2011  . GERD (gastroesophageal reflux disease)   . History of hiatal hernia   . Hyperlipidemia   . Hypertension   . Hypothyroidism   . Myocardial infarction ([place])   . Obstructive sleep apnea    -no cpap use  . Premature atrial contractions Holter 2016  . PVC's (premature ventricular contractions) Holter 2016  . Statin intolerance   . Transfusion history    several years ago -GI bleed   Past Surgical History:  Procedure Laterality Date  . ANKLE SURGERY Left   . APPENDECTOMY    . CARDIAC CATHETERIZATION N/A 11/26/2014   Procedure: Left Heart Cath and Coronary Angiography;  Surgeon: Burnell Blanks, MD;  Location: [place] [facility];  Service: Cardiovascular;  Laterality: N/A;  . CHOLECYSTECTOMY N/A 08/23/2016   Procedure: LAPAROSCOPIC CHOLECYSTECTOMY;  Surgeon: Coralie Keens, MD;  Location: [place];  Service: General;  Laterality: N/A;  . COLONOSCOPY  08-23-05   per Dr. Deatra Ina, adenomatous polyps, repeat in 5 yrs   . COLONOSCOPY WITH PROPOFOL N/A 12/06/2015   Procedure: COLONOSCOPY WITH PROPOFOL;  Surgeon: Doran Stabler, MD;  [facility]: [facility];  Service: Gastroenterology;  Laterality: N/A;  . ENTEROSCOPY N/A 12/06/2015   Procedure: ENTEROSCOPY;  Surgeon: Doran Stabler, MD;  Location: [facility];  Service: Gastroenterology;  Laterality: N/A;  . ESOPHAGOGASTRODUODENOSCOPY  08-23-05   per Dr. Deatra Ina, cauterized jejunal AVMs   . HERNIA REPAIR    . HOT HEMOSTASIS N/A 12/06/2015   Procedure: HOT HEMOSTASIS (ARGON PLASMA COAGULATION/BICAP);  Surgeon: Nelida Meuse III,  MD;  Location: [facility];  Service: Gastroenterology;  Laterality: N/A;  . LEFT HEART CATHETERIZATION WITH CORONARY ANGIOGRAM N/A 09/08/2012   Procedure: LEFT HEART CATHETERIZATION WITH CORONARY ANGIOGRAM;  Surgeon: Burnell Blanks, MD;  Location: [facility] CATH LAB;  Service: Cardiovascular;  Laterality: N/A;  . LEFT HEART CATHETERIZATION WITH CORONARY ANGIOGRAM N/A 11/16/2014    Procedure: LEFT HEART CATHETERIZATION WITH CORONARY ANGIOGRAM;  Surgeon: Leonie Man, MD;  Location: [facility] CATH LAB;  Service: Cardiovascular;  Laterality: N/A;  . PACEMAKER IMPLANT N/A 12/24/2016   Procedure: Pacemaker Implant;  Surgeon: Deboraha Sprang, MD;  Location: [place] CV LAB;  Service: Cardiovascular;  Laterality: N/A;  . SKIN GRAFT Right    leg  . TONSILLECTOMY      reports that he quit smoking about 14 years ago. His smoking use included Cigarettes. He has a 100.00 pack-year smoking history. He has never used smokeless tobacco. He reports that he does not drink alcohol or use drugs. family history includes Alcoholism in his maternal grandfather and paternal uncle; Heart attack in his father; Heart disease in his father; Leukemia in his mother; Stroke in his father. Allergies  Allergen Reactions  . Brilinta [Ticagrelor] Shortness Of Breath  . Metoprolol Tartrate Other (See Comments)    Severe chest pains " flat lined patient"  . Mirapex [Pramipexole Dihydrochloride]     Severe leg pain  . Shellfish Allergy Anaphylaxis and Hives  . Statins Other (See Comments)    All statins cause myalgias   . Zolpidem Other (See Comments)    Chest pain  . Levaquin [Levofloxacin] Hives  . Trazodone And Nefazodone Other (See Comments)    Unsteady on feet     Review of Systems  Constitutional: Positive for fatigue. Negative for chills and fever.  Eyes: Negative for visual disturbance.  Respiratory: Positive for shortness of breath (chronic and unchanged).   Cardiovascular: Negative for chest pain, palpitations and leg swelling.  Gastrointestinal: Negative for abdominal pain, nausea and vomiting.  Genitourinary: Negative for dysuria.  Neurological: Positive for dizziness and light-headedness. Negative for seizures, syncope, speech difficulty and headaches.  Psychiatric/Behavioral: Negative for dysphoric mood.       Objective:   Physical Exam  Constitutional: He is oriented to  person, place, and time. He appears well-developed and well-nourished.  HENT:  Mouth/Throat: Oropharynx is clear and moist.  Neck: Neck supple. No thyromegaly present.  Cardiovascular: Normal rate and regular rhythm.   Pulmonary/Chest: Effort normal and breath sounds normal. No respiratory distress. He has no wheezes.  Musculoskeletal: He exhibits no edema.  Neurological: He is alert and oriented to person, place, and time. No cranial nerve deficit. Coordination normal.  Psychiatric: He has a normal mood and affect. His behavior is normal. Judgment and thought content normal.       Assessment:     #1 hypothyroidism with recent labs indicating under replacement. He had adjustment in dosage just 3 weeks ago  #2 lightheadedness. No demonstrated orthostatic change today. Blood pressure seated left arm at rest 118/70 and standing 120/70. No recent anemia by CBC 1 week ago  #3 history of atrial flutter currently on Xarelto    Plan:     -Future lab order for repeat TSH in about 6 weeks -Stay adequately hydrated and change positions slowly -Continue close follow-up with cardiology -Follow-up with primary if lightheaded symptoms persist  Eulas Post MD [facility] at [facility]

## 2017-01-16 NOTE — Patient Instructions (Signed)
Make sure you return in about 6 weeks to get thyroid blood test re-checked.

## 2017-01-21 ENCOUNTER — Other Ambulatory Visit (HOSPITAL_COMMUNITY): Payer: Medicare Other

## 2017-01-22 ENCOUNTER — Ambulatory Visit: Payer: Medicare Other | Admitting: Family Medicine
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
## 2017-01-31 ENCOUNTER — Ambulatory Visit (INDEPENDENT_AMBULATORY_CARE_PROVIDER_SITE_OTHER): Payer: Medicare Other | Admitting: Cardiovascular Disease

## 2017-01-31 ENCOUNTER — Encounter: Payer: Self-pay | Admitting: Cardiovascular Disease

## 2017-01-31 ENCOUNTER — Encounter (INDEPENDENT_AMBULATORY_CARE_PROVIDER_SITE_OTHER): Payer: Self-pay

## 2017-01-31 VITALS — BP 122/66 | HR 65 | Ht 70.0 in | Wt 234.2 lb

## 2017-01-31 DIAGNOSIS — I5032 Chronic diastolic (congestive) heart failure: Secondary | ICD-10-CM | POA: Diagnosis not present

## 2017-01-31 DIAGNOSIS — I25118 Atherosclerotic heart disease of native coronary artery with other forms of angina pectoris: Secondary | ICD-10-CM

## 2017-01-31 DIAGNOSIS — E7800 Pure hypercholesterolemia, unspecified: Secondary | ICD-10-CM | POA: Diagnosis not present

## 2017-01-31 DIAGNOSIS — I4892 Unspecified atrial flutter: Secondary | ICD-10-CM | POA: Diagnosis not present

## 2017-01-31 DIAGNOSIS — I495 Sick sinus syndrome: Secondary | ICD-10-CM | POA: Diagnosis not present

## 2017-01-31 DIAGNOSIS — R5383 Other fatigue: Secondary | ICD-10-CM

## 2017-01-31 NOTE — Patient Instructions (Signed)

## 2017-01-31 NOTE — Progress Notes (Signed)
Chief Complaint  Patient presents with  . Fatigue    History of Present Illness: 76 yo male with history of CAD, HTN, HLD, sleep apnea, sinus node dysfunction s/p pacemaker, atrial flutter here today for follow up. Cardiac caths in 2009 and 2014 with moderate non-obstructive CAD. He has not tolerated statins. He was admitted to [facility] April 2016 with unstable angina. Cardiac cath with severe disease in the LAD and RCA, both treated with drug eluting stents. Repeat cath May 2016 due to dyspnea with stable disease. Symptoms resolved off of Brilinta. Cardiac monitor June 2016 with PACs, PVCs. He has had severe anemia due to angiodysplasia. Cardiac monitor May 2017 with nocturnal bradycardia with rates as low as 22 bpm with blocked PACs. Echo June 2017 with normal LV systolic function, grade 1 diastolic dysfunction, moderate LVH. He was seen in the EP clinic by Dr. Lennie Odor 12/30/15 and sleep study was arranged but he did not keep this appt. He did not tolerate Norvasc due to LE edema and did not tolerate beta blockers due to bradycardia. I saw him 12/03/16 and he c/o episodes of dizziness but no palpitations. I arranged a cardiac monitor which showed atrial fib/flutter and long pauses. Xarelto was started. Plavix was stopped. He was seen by EP and a pacemaker was implanted on 12/24/16. Synthroid increased due to high TSH. Following his pacemaker implantation, he has c/o dry cough and he was treated with a round of antibiotics by primary care. He also had c/o chest pain and dizziness. He was seen in our office 01/08/17 by Melina Copa, PA-C and based on is complaints, he was admitted to [facility] from our office. Echo 01/08/17 with normal LV size and function and no valve issues. Nuclear stress test showed a large apical/anteroapical and inferoapical scar but no ischemia. D-dimer negative. BNP was normal. Troponin negative. I saw him in my office 01/14/17 and he felt terrible with c/o weakness, fatigue, aching in legs, rare  chest pains. TSH was elevated in June and Synthroid was increased. He was seen in primary care 01/16/17 and no new recommendations given. Carotid dopplers June 2017 with mild bllateral disease.   He is here today for follow up. The patient denies any chest pain, dyspnea, palpitations, lower extremity edema, orthopnea, PND, near syncope or syncope. He still feels weak. Rare dizziness.     Primary Care Physician: [name], MD  Past Medical History:  Diagnosis Date  . Adenomatous polyp of colon 2007  . Arthritis   . Atrial flutter ([place])   . AVM (arteriovenous malformation) 2011   a. S/p argon plasma coagulation and ablation in 2011, 2017.  . Bradycardia    a. H/o almost 7sec pause nocturnally during 2011 admission. Also has h/o fatigue with BB.  [name] CAD (coronary artery disease)    a. Nonobst disease by cath 2009. b. s/p PCI 10/2014 with DES to [place], patent by relook 11/2014 (Brilinta changed to Plavix with improved sx).  . Chronic diastolic CHF (congestive heart failure) ([place])   . Depression   . Diverticulosis   . Gastritis 2011  . GERD (gastroesophageal reflux disease)   . History of hiatal hernia   . Hyperlipidemia   . Hypertension   . Hypothyroidism   . Myocardial infarction ([place])   . Obstructive sleep apnea    -no cpap use  . Premature atrial contractions Holter 2016  . PVC's (premature ventricular contractions) Holter 2016  . Statin intolerance   . Transfusion  history    several years ago -GI bleed    Past Surgical History:  Procedure Laterality Date  . ANKLE SURGERY Left   . APPENDECTOMY    . CARDIAC CATHETERIZATION N/A 11/26/2014   Procedure: Left Heart Cath and Coronary Angiography;  Surgeon: Burnell Blanks, MD;  Location: [facility];  Service: Cardiovascular;  Laterality: N/A;  . CHOLECYSTECTOMY N/A 08/23/2016   Procedure: LAPAROSCOPIC CHOLECYSTECTOMY;  Surgeon: Coralie Keens, MD;  Location: [facility];  Service: General;  Laterality: N/A;  .  COLONOSCOPY  08-23-05   per Dr. Deatra Ina, adenomatous polyps, repeat in 5 yrs   . COLONOSCOPY WITH PROPOFOL N/A 12/06/2015   Procedure: COLONOSCOPY WITH PROPOFOL;  Surgeon: Doran Stabler, MD;  Location: [facility];  Service: Gastroenterology;  Laterality: N/A;  . ENTEROSCOPY N/A 12/06/2015   Procedure: ENTEROSCOPY;  Surgeon: Doran Stabler, MD;  Location: [facility];  Service: Gastroenterology;  Laterality: N/A;  . ESOPHAGOGASTRODUODENOSCOPY  08-23-05   per Dr. Deatra Ina, cauterized jejunal AVMs   . HERNIA REPAIR    . HOT HEMOSTASIS N/A 12/06/2015   Procedure: HOT HEMOSTASIS (ARGON PLASMA COAGULATION/BICAP);  Surgeon: Doran Stabler, MD;  Location: [facility];  Service: Gastroenterology;  Laterality: N/A;  . LEFT HEART CATHETERIZATION WITH CORONARY ANGIOGRAM N/A 09/08/2012   Procedure: LEFT HEART CATHETERIZATION WITH CORONARY ANGIOGRAM;  Surgeon: Burnell Blanks, MD;  Location: [facility];  Service: Cardiovascular;  Laterality: N/A;  . LEFT HEART CATHETERIZATION WITH CORONARY ANGIOGRAM N/A 11/16/2014   Procedure: LEFT HEART CATHETERIZATION WITH CORONARY ANGIOGRAM;  Surgeon: Leonie Man, MD;  Location: [facility];  Service: Cardiovascular;  Laterality: N/A;  . PACEMAKER IMPLANT N/A 12/24/2016   Procedure: Pacemaker Implant;  Surgeon: Deboraha Sprang, MD;  Location: [facility];  Service: Cardiovascular;  Laterality: N/A;  . SKIN GRAFT Right    leg  . TONSILLECTOMY      Current Outpatient Prescriptions  Medication Sig Dispense Refill  . Ascorbic Acid (VITAMIN C PO) Take 4 tablets by mouth daily as needed (flu like symptopms).     . clonazePAM (KLONOPIN) 0.5 MG tablet TAKE 1 TABLET 3 TIMES DAILY AS NEEDED 270 tablet 1  . diclofenac sodium (VOLTAREN) 1 % GEL Apply 1 application topically daily as needed (ankle pain). 5 Tube 10  . diltiazem (CARDIZEM CD) 120 MG 24 hr capsule Take 1 capsule (120 mg total) by mouth daily. 90 capsule 1  . ezetimibe (ZETIA) 10 MG tablet Take 1  tablet (10 mg total) by mouth daily. 30 tablet 11  . fluticasone (FLONASE) 50 MCG/ACT nasal spray Place 1 spray into both nostrils daily as needed for allergies or rhinitis. 48 g 4  . furosemide (LASIX) 20 MG tablet Take 20 mg by mouth every other day.    . levothyroxine (SYNTHROID, LEVOTHROID) 125 MCG tablet Take 1 tablet (125 mcg total) by mouth daily before breakfast. 90 tablet 0  . methocarbamol (ROBAXIN) 750 MG tablet Take 750 mg by mouth 4 (four) times daily as needed for muscle spasms.     . nitroGLYCERIN (NITROSTAT) 0.4 MG SL tablet Place 1 tablet (0.4 mg total) under the tongue every 5 (five) minutes as needed for chest pain. 25 tablet 12  . omeprazole (PRILOSEC) 20 MG capsule TAKE ONE CAPSULE BY MOUTH TWICE A DAY 60 capsule 11  . Oxycodone HCl 20 MG TABS Take 1 tablet (20 mg total) by mouth every 6 (six) hours as needed (severe pain). 120 tablet 0  .  polyethylene glycol (MIRALAX / GLYCOLAX) packet Take 17 g by mouth daily as needed for mild constipation.    . rivaroxaban (XARELTO) 20 MG TABS tablet Take 1 tablet (20 mg total) by mouth daily with supper. 30 tablet 6  . ropinirole (REQUIP) 5 MG tablet Take 10 mg by mouth at bedtime.  5  . Tetrahydrozoline HCl (MURINE FOR RED EYES OP) Place 1 drop into both eyes daily as needed (red eyes).    . triamcinolone cream (KENALOG) 0.1 % Apply 1 application topically 2 (two) times daily as needed (as directed for skin).     No current facility-administered medications for this visit.     Allergies  Allergen Reactions  . Brilinta [Ticagrelor] Shortness Of Breath  . Metoprolol Tartrate Other (See Comments)    Severe chest pains " flat lined patient"  . Mirapex [Pramipexole Dihydrochloride]     Severe leg pain  . Shellfish Allergy Anaphylaxis and Hives  . Statins Other (See Comments)    All statins cause myalgias   . Zolpidem Other (See Comments)    Chest pain  . Levaquin [Levofloxacin] Hives  . Trazodone And Nefazodone Other (See  Comments)    Unsteady on feet    Social History   Social History  . Marital status: Married    Spouse name: N/A  . Number of children: 1  . Years of education: N/A   Occupational History  . Disabled Unemployed   Social History Main Topics  . Smoking status: Former Smoker    Packs/day: 2.00    Years: 50.00    Types: Cigarettes    Quit date: 07/23/2002  . Smokeless tobacco: Never Used  . Alcohol use No  . Drug use: No  . Sexual activity: Not on file   Other Topics Concern  . Not on file   Social History Narrative  . No narrative on file    Family History  Problem Relation Age of Onset  . Leukemia Mother   . Heart disease Father   . Stroke Father   . Heart attack Father   . Alcoholism Paternal Uncle   . Alcoholism Maternal Grandfather   . Colon cancer Neg Hx   . Esophageal cancer Neg Hx     Review of Systems:  As stated in the HPI and otherwise negative.   BP 122/66   Pulse 65   Ht 5\' 10"  (1.778 m)   Wt 234 lb 3.2 oz (106.2 kg)   SpO2 92%   BMI 33.60 kg/m   Physical Examination:  General: Well developed, well nourished, NAD  HEENT: OP clear, mucus membranes moist  SKIN: warm, dry. No rashes. Neuro: No focal deficits  Musculoskeletal: Muscle strength 5/5 all ext  Psychiatric: Mood and affect normal  Neck: No JVD, no carotid bruits, no thyromegaly, no lymphadenopathy.  Lungs:Clear bilaterally, no wheezes, rhonci, crackles Cardiovascular: Regular rate and rhythm. No murmurs, gallops or rubs. Abdomen:Soft. Bowel sounds present. Non-tender.  Extremities: No lower extremity edema. Pulses are 2 + in the bilateral DP/PT.  Echo 01/08/17: Left ventricle: The cavity size was normal. Wall thickness was   increased in a pattern of mild LVH. Systolic function was normal.   The estimated ejection fraction was in the range of 60% to 65%.   Wall motion was normal; there were no regional wall motion   abnormalities. Doppler parameters are consistent with abnormal    left ventricular relaxation (grade 1 diastolic dysfunction). - Aortic valve: Transvalvular velocity was within the normal range.  There was no stenosis. There was no regurgitation. - Mitral valve: Mildly calcified annulus. Transvalvular velocity   was within the normal range. There was no evidence for stenosis.   There was no regurgitation. - Left atrium: The atrium was mildly dilated. - Right ventricle: The cavity size was normal. Wall thickness was   normal. Systolic function was normal. - Atrial septum: No defect or patent foramen ovale was identified. - Tricuspid valve: There was trivial regurgitation.  Cardiac cath 11/26/14: Left Anterior Descending   . Ost LAD to Prox LAD lesion, 50% stenosed. discrete . The lesion was not previously treated.   . Prox LAD to Mid LAD lesion, 0% stenosed. Previously placed Prox LAD to Mid LAD stent (unknown type) is patent.   Jorene Minors LAD lesion, 25% stenosed.      Left Circumflex   . Mid Cx lesion, 30% stenosed.     Right Coronary Artery   . Prox RCA lesion, 20% stenosed.   . Mid RCA lesion, 40% stenosed.   . Mid RCA to Dist RCA lesion, 30% stenosed.   . Right Posterior Descending Artery   . RPDA lesion, 0% stenosed. Previously placed RPDA drug eluting stent is patent.     EKG:  EKG is not ordered today. The ekg ordered today demonstrates    Recent Labs: 12/24/2016: TSH 16.966 01/08/2017: ALT 22; B Natriuretic Peptide 25.9 01/09/2017: BUN 13; Creatinine, Ser 1.12; Hemoglobin 13.4; Platelets 239; Potassium 4.4; Sodium 138   Lipid Panel    Component Value Date/Time   CHOL 184 03/22/2016 0935   TRIG 230 (H) 03/22/2016 0935   HDL 40 03/22/2016 0935   CHOLHDL 4.6 03/22/2016 0935   VLDL 46 (H) 03/22/2016 0935   LDLCALC 98 03/22/2016 0935     Wt Readings from Last 3 Encounters:  01/31/17 234 lb 3.2 oz (106.2 kg)  01/16/17 231 lb 12.8 oz (105.1 kg)  01/14/17 231 lb (104.8 kg)     Other studies Reviewed: Additional studies/ records that  were reviewed today include: . Review of the above records demonstrates:    Assessment and Plan:   1. CAD with stable angina: No chest pain suggestive of angina. Recent normal stress test. Continue ASA and Zetia.    2. Chronic diastolic CHF: Weight is stable. Continue daily Lasix       3. Hyperlipidemia: Intolerant of statins. He is on Zetia. He does not wish to consider advanced lipid therapies.    4. Sinus node dysfunction: s/p pacemaker.    5. Atrial flutter, paroxysmal: He is followed by Dr. [name]. He is on Xarelto. Can consider atrial flutter ablation with recurrence.   6. Weakness/Dizziness/Fatigue: Recent extensive cardiac workup with normal stress test, normal LV function on echo, no valve disease. BP is stable. Renal function stable. He has been see in primary care with no other recommendations. Synthroid dose is being adjusted.  I have nothing to add today.   Current medicines are reviewed at length with the patient today.  The patient does not have concerns regarding medicines.  The following changes have been made:  no change  Labs/ tests ordered today include:   No orders of the defined types were placed in this encounter.  Disposition:   FU with me in 6 months    Signed, [name], MD 01/31/2017 9:07 AM    [facility] [place], [place], [place]  [address] Phone: [phone]; Fax: [phone]

## 2017-02-11 ENCOUNTER — Telehealth: Payer: Self-pay | Admitting: Family Medicine

## 2017-02-11 MED ORDER — OXYCODONE HCL 20 MG PO TABS: 20.0000 mg | ORAL_TABLET | Freq: Four times a day (QID) | ORAL | 0 refills | Status: DC | PRN

## 2017-02-11 NOTE — Telephone Encounter (Signed)
done

## 2017-02-11 NOTE — Telephone Encounter (Signed)
Script is ready for pick up here at front office and I spoke with pt.  

## 2017-02-11 NOTE — Telephone Encounter (Signed)
Pt need new Rx for Oxycodone   Pt is aware of 3 business days for refills and someone will call when ready for pick up. °

## 2017-02-25 ENCOUNTER — Other Ambulatory Visit: Payer: Self-pay | Admitting: Family Medicine

## 2017-03-03 ENCOUNTER — Other Ambulatory Visit: Payer: Self-pay | Admitting: Family Medicine

## 2017-03-06 ENCOUNTER — Encounter: Payer: Self-pay | Admitting: Internal Medicine

## 2017-03-11 DIAGNOSIS — R972 Elevated prostate specific antigen [PSA]: Secondary | ICD-10-CM | POA: Diagnosis not present

## 2017-03-14 NOTE — Addendum Note (Signed)
Addendum  created 03/14/17 1029 by Roberts Gaudy, MD   Sign clinical note

## 2017-03-26 ENCOUNTER — Telehealth: Payer: Self-pay | Admitting: Family Medicine

## 2017-03-26 ENCOUNTER — Telehealth: Payer: Self-pay | Admitting: Cardiovascular Disease

## 2017-03-26 NOTE — Telephone Encounter (Signed)
I reviewed with Fuller Canada, PharmD and Xarelto does not usually cause leg pain and weakness. I spoke with pt who reports his leg pain has been getting worse.  Legs also are shaky and he feels like he is going to collapse.  He stopped Xarelto on Friday and symptoms improved.  He can now walk to garage and mailbox without problems.  I explained to pt why he is on Xarelto.  Pt reports he also stopped Cardizem on Friday.  I told pt the only way to determine which medication may be causing side effects would be to stop one at a time.  I asked him to resume Xarelto but stay off Cardizem and call us back with update later this week.  He states he will know within 2 days and will call us back on Thursday.

## 2017-03-26 NOTE — Telephone Encounter (Signed)
Thanks. chris 

## 2017-03-26 NOTE — Telephone Encounter (Signed)
New message    Pt is calling to talk to RN. He is calling to ask about stopping taking his medication or if he can have something different.  Pt c/o medication issue:  1. Name of Medication: xarelto   2. How are you currently taking this medication (dosage and times per day)? 20 mg  3. Are you having a reaction (difficulty breathing--STAT)? Pain in legs  4. What is your medication issue? Pt stopped taking this medication last Friday. He said this medication was causing his legs from his knees down to hurt. He said he couldn't walk, his legs were shaky and had to walk with a cane.

## 2017-03-26 NOTE — Telephone Encounter (Signed)
Pt need new Rx for Oxycodone   Pt is aware of 3 business days for refills and someone will call when ready for pick up. °

## 2017-03-27 MED ORDER — OXYCODONE HCL 20 MG PO TABS: 20.0000 mg | ORAL_TABLET | Freq: Four times a day (QID) | ORAL | 0 refills | Status: DC | PRN

## 2017-03-27 NOTE — Telephone Encounter (Signed)
Done

## 2017-03-28 ENCOUNTER — Encounter: Payer: Medicare Other | Admitting: Internal Medicine

## 2017-03-28 ENCOUNTER — Telehealth: Payer: Self-pay | Admitting: Family Medicine

## 2017-03-28 ENCOUNTER — Other Ambulatory Visit: Payer: Self-pay | Admitting: Family Medicine

## 2017-03-28 MED ORDER — LEVOTHYROXINE SODIUM 125 MCG PO TABS: 125.0000 ug | ORAL_TABLET | Freq: Every day | ORAL | 0 refills | Status: DC

## 2017-03-28 NOTE — Telephone Encounter (Signed)
I sent script e[facility]scribe to [facility] for a 30 day supply.

## 2017-03-28 NOTE — Telephone Encounter (Signed)
Pt has made appt same day as wife, 9/18. Can you send in a 30 day levothyroxine (SYNTHROID, LEVOTHROID) 125 MCG tablet   [id number] - [place], [place] - [address] [place] [address]

## 2017-03-28 NOTE — Telephone Encounter (Signed)
Script is ready for pick up here at front office and I left a message.  

## 2017-04-03 ENCOUNTER — Ambulatory Visit (INDEPENDENT_AMBULATORY_CARE_PROVIDER_SITE_OTHER): Payer: Medicare Other | Admitting: Internal Medicine

## 2017-04-03 ENCOUNTER — Encounter: Payer: Self-pay | Admitting: Internal Medicine

## 2017-04-03 VITALS — BP 132/66 | HR 64 | Ht 71.0 in | Wt 232.0 lb

## 2017-04-03 DIAGNOSIS — I4892 Unspecified atrial flutter: Secondary | ICD-10-CM

## 2017-04-03 DIAGNOSIS — Z950 Presence of cardiac pacemaker: Secondary | ICD-10-CM

## 2017-04-03 DIAGNOSIS — I495 Sick sinus syndrome: Secondary | ICD-10-CM | POA: Diagnosis not present

## 2017-04-03 NOTE — Patient Instructions (Signed)
Medication Instructions: - Your physician recommends that you continue on your current medications as directed. Please refer to the Current Medication list given to you today.  Labwork: - none ordered  Procedures/Testing: - none ordered  Follow-Up: - Remote monitoring is used to monitor your Pacemaker of ICD from home. This monitoring reduces the number of office visits required to check your device to one time per year. It allows [place] to keep an eye on the functioning of your device to ensure it is working properly. You are scheduled for a device check from home on 07/03/17. You may send your transmission at any time that day. If you have a wireless device, the transmission will be sent automatically. After your physician reviews your transmission, you will receive a postcard with your next transmission date.  - Your physician wants you to follow-up in: 9 months with Tommye Standard, PA for Dr. [name]. You will receive a reminder letter in the mail two months in advance. If you don't receive a letter, please call our office to schedule the follow-up appointment.   Any Additional Special Instructions Will Be Listed Below (If Applicable). - call the office in a couple of weeks if you are still having leg cramps and Dr. [name] will try you on a different blood thinner.    If you need a refill on your cardiac medications before your next appointment, please call your pharmacy.

## 2017-04-03 NOTE — Progress Notes (Signed)
Patient Care Team: [name], MD as PCP - General   HPI  [name] is a 76 y.o. male Seen in follow-up for sinus node dysfunction status post pacing (6/18) and atrial flutter  He continues to have major complaints of lassitude late. He has seen Dr. Kendra Opitz couple of occasions this summer because of generalized complaints. Most recently, his diltiazem was discontinued and his legs feel considerably better. He wonders whether his Rivaroxaban We be contributing   He continues to have some lightheadedness with standing. He has had no syncope.  Records and Results Reviewed notes  Date Cr Hgb   6/18 1.12 13.4          Past Medical History:  Diagnosis Date  . Adenomatous polyp of colon 2007  . Arthritis   . Atrial flutter ([place])   . AVM (arteriovenous malformation) 2011   a. S/p argon plasma coagulation and ablation in 2011, 2017.  . Bradycardia    a. H/o almost 7sec pause nocturnally during 2011 admission. Also has h/o fatigue with BB.  [name] CAD (coronary artery disease)    a. Nonobst disease by cath 2009. b. s/p PCI 10/2014 with DES to [place], patent by relook 11/2014 (Brilinta changed to Plavix with improved sx).  . Chronic diastolic CHF (congestive heart failure) ([place])   . Depression   . Diverticulosis   . Gastritis 2011  . GERD (gastroesophageal reflux disease)   . History of hiatal hernia   . Hyperlipidemia   . Hypertension   . Hypothyroidism   . Myocardial infarction ([place])   . Obstructive sleep apnea    -no cpap use  . Premature atrial contractions Holter 2016  . PVC's (premature ventricular contractions) Holter 2016  . Statin intolerance   . Transfusion history    several years ago -GI bleed    Past Surgical History:  Procedure Laterality Date  . ANKLE SURGERY Left   . APPENDECTOMY    . CARDIAC CATHETERIZATION N/A 11/26/2014   Procedure: Left Heart Cath and Coronary Angiography;  Surgeon: Burnell Blanks, MD;  Location: [facility];  Service: Cardiovascular;  Laterality: N/A;  . CHOLECYSTECTOMY N/A 08/23/2016   Procedure: LAPAROSCOPIC CHOLECYSTECTOMY;  Surgeon: Coralie Keens, MD;  Location: [facility];  Service: General;  Laterality: N/A;  . COLONOSCOPY  08-23-05   per Dr. Deatra Ina, adenomatous polyps, repeat in 5 yrs   . COLONOSCOPY WITH PROPOFOL N/A 12/06/2015   Procedure: COLONOSCOPY WITH PROPOFOL;  Surgeon: Doran Stabler, MD;  Location: [facility];  Service: Gastroenterology;  Laterality: N/A;  . ENTEROSCOPY N/A 12/06/2015   Procedure: ENTEROSCOPY;  Surgeon: Doran Stabler, MD;  Location: [facility];  Service: Gastroenterology;  Laterality: N/A;  . ESOPHAGOGASTRODUODENOSCOPY  08-23-05   per Dr. Deatra Ina, cauterized jejunal AVMs   . HERNIA REPAIR    . HOT HEMOSTASIS N/A 12/06/2015   Procedure: HOT HEMOSTASIS (ARGON PLASMA COAGULATION/BICAP);  Surgeon: Doran Stabler, MD;  Location: [facility];  Service: Gastroenterology;  Laterality: N/A;  . LEFT HEART CATHETERIZATION WITH CORONARY ANGIOGRAM N/A 09/08/2012   Procedure: LEFT HEART CATHETERIZATION WITH CORONARY ANGIOGRAM;  Surgeon: Burnell Blanks, MD;  Location: [facility];  Service: Cardiovascular;  Laterality: N/A;  . LEFT HEART CATHETERIZATION WITH CORONARY ANGIOGRAM N/A 11/16/2014   Procedure: LEFT HEART CATHETERIZATION WITH CORONARY ANGIOGRAM;  Surgeon: Leonie Man, MD;  Location: [facility] C[facility];  Service: Cardiovascular;  Laterality: N/A;  . PACEMAKER IMPLANT  N/A 12/24/2016   Procedure: Pacemaker Implant;  Surgeon: Deboraha Sprang, MD;  Location: [place] [facility];  Service: Cardiovascular;  Laterality: N/A;  . SKIN GRAFT Right    leg  . TONSILLECTOMY      Current Outpatient Prescriptions  Medication Sig Dispense Refill  . Ascorbic Acid (VITAMIN C PO) Take 4 tablets by mouth daily as needed (flu like symptopms).     . clonazePAM (KLONOPIN) 0.5 MG tablet TAKE 1 TABLET 3 TIMES DAILY AS NEEDED 270 tablet 1  . diclofenac sodium (VOLTAREN) 1 % GEL  Apply 1 application topically daily as needed (ankle pain). 5 Tube 10  . diltiazem (CARDIZEM CD) 120 MG 24 hr capsule Take 1 capsule (120 mg total) by mouth daily. 90 capsule 1  . ezetimibe (ZETIA) 10 MG tablet Take 1 tablet (10 mg total) by mouth daily. 30 tablet 11  . fluticasone (FLONASE) 50 MCG/ACT nasal spray Place 1 spray into both nostrils daily as needed for allergies or rhinitis. 48 g 4  . furosemide (LASIX) 20 MG tablet Take 20 mg by mouth every other day.    . levothyroxine (SYNTHROID, LEVOTHROID) 125 MCG tablet Take 1 tablet (125 mcg total) by mouth daily before breakfast. 30 tablet 0  . methocarbamol (ROBAXIN) 750 MG tablet Take 750 mg by mouth 4 (four) times daily as needed for muscle spasms.     . nitroGLYCERIN (NITROSTAT) 0.4 MG SL tablet Place 1 tablet (0.4 mg total) under the tongue every 5 (five) minutes as needed for chest pain. 25 tablet 12  . omeprazole (PRILOSEC) 20 MG capsule TAKE ONE CAPSULE BY MOUTH TWICE A DAY 60 capsule 11  . Oxycodone HCl 20 MG TABS Take 1 tablet (20 mg total) by mouth every 6 (six) hours as needed (severe pain). 120 tablet 0  . polyethylene glycol (MIRALAX / GLYCOLAX) packet Take 17 g by mouth daily as needed for mild constipation.    . rivaroxaban (XARELTO) 20 MG TABS tablet Take 1 tablet (20 mg total) by mouth daily with supper. 30 tablet 6  . ropinirole (REQUIP) 5 MG tablet TAKE 2 TABLETS BY MOUTH AT BEDTIME 60 tablet 5  . Tetrahydrozoline HCl (MURINE FOR RED EYES OP) Place 1 drop into both eyes daily as needed (red eyes).    . triamcinolone cream (KENALOG) 0.1 % Apply 1 application topically 2 (two) times daily as needed (as directed for skin).     No current facility-administered medications for this visit.     Allergies  Allergen Reactions  . Brilinta [Ticagrelor] Shortness Of Breath  . Metoprolol Tartrate Other (See Comments)    Severe chest pains " flat lined patient"  . Mirapex [Pramipexole Dihydrochloride]     Severe leg pain  .  Shellfish Allergy Anaphylaxis and Hives  . Statins Other (See Comments)    All statins cause myalgias   . Zolpidem Other (See Comments)    Chest pain  . Levaquin [Levofloxacin] Hives  . Trazodone And Nefazodone Other (See Comments)    Unsteady on feet      Review of Systems negative except from HPI and PMH  Physical Exam BP 132/66   Pulse 64   Ht _0  (1.803 m)   Wt 232 lb (105.2 kg)   SpO2 98%   BMI 32.36 kg/m  Well developed and well nourished in no acute distress HENT normal E scleral and icterus clear Neck Supple JVP flat; carotids brisk and full Clear to ausculation Device pocket well healed; without  hematoma or erythema.  There is no tethering Regular rate and rhythm, no murmurs gallops or rub Soft with active bowel sounds No clubbing cyanosis  Edema Alert and oriented, grossly normal motor and sensory function Skin Warm and Dry  ECG  Sinus with PACs 74 20/08/38  Assessment and  Plan Syncope-presyncope  Sinus node dysfunction   Atrial flutter  Coronary artery disease with prior stenting  Pacemaker Med[employer]onic MRI compatible   Leg pain   no interval atrial arrhythmias of any note. We will continue him on his anticoagulant. She wonders whether it is contributing to his leg discomfort. In the event that it does not continue to resolve following the discontinuation of diltiazem will undertake a trial of a different NOAC.   Device function is otherwise normal  Without symptoms of ischemia   Current medicines are reviewed at length with the patient today .  The patient does not  have concerns regarding medicines.

## 2017-04-04 NOTE — Telephone Encounter (Signed)
Pt saw Dr. [name] on 04/03/17

## 2017-04-09 ENCOUNTER — Ambulatory Visit (INDEPENDENT_AMBULATORY_CARE_PROVIDER_SITE_OTHER): Payer: Medicare Other | Admitting: Family Medicine

## 2017-04-09 ENCOUNTER — Encounter: Payer: Self-pay | Admitting: Family Medicine

## 2017-04-09 VITALS — BP 133/79 | HR 77 | Temp 97.3°F | Ht 71.0 in | Wt 233.0 lb

## 2017-04-09 DIAGNOSIS — G8929 Other chronic pain: Secondary | ICD-10-CM | POA: Diagnosis not present

## 2017-04-09 DIAGNOSIS — I10 Essential (primary) hypertension: Secondary | ICD-10-CM | POA: Diagnosis not present

## 2017-04-09 DIAGNOSIS — Z23 Encounter for immunization: Secondary | ICD-10-CM | POA: Diagnosis not present

## 2017-04-09 DIAGNOSIS — M5440 Lumbago with sciatica, unspecified side: Secondary | ICD-10-CM

## 2017-04-09 DIAGNOSIS — E039 Hypothyroidism, unspecified: Secondary | ICD-10-CM | POA: Diagnosis not present

## 2017-04-09 LAB — TSH: TSH: 11.07 u[IU]/mL — ABNORMAL HIGH (ref 0.35–4.50)

## 2017-04-09 MED ORDER — DILTIAZEM HCL ER COATED BEADS 120 MG PO CP24: 120.0000 mg | ORAL_CAPSULE | Freq: Every day | ORAL | 1 refills | Status: DC

## 2017-04-09 MED ORDER — GABAPENTIN 100 MG PO CAPS: 100.0000 mg | ORAL_CAPSULE | Freq: Three times a day (TID) | ORAL | 3 refills | Status: DC

## 2017-04-09 NOTE — Patient Instructions (Signed)
WE NOW OFFER   [facility] FAST TRACK!!!  SAME DAY Appointments for ACUTE CARE  Such as: Sprains, Injuries, cuts, abrasions, rashes, muscle pain, joint pain, back pain Colds, flu, sore throats, headache, allergies, cough, fever  Ear pain, sinus and eye infections Abdominal pain, nausea, vomiting, diarrhea, upset stomach Animal/insect bites  3 Easy Ways to Schedule: Walk-In Scheduling Call in scheduling Mychart Sign-up: [email or web address]         

## 2017-04-09 NOTE — Progress Notes (Signed)
   Subjective:    Patient ID: [name], male    DOB: 1941/02/16, 76 y.o.   MRN: [id number]  HPI Here to follow up. Three months ago we increased the dose of this Synthroid and we need to recheck a TSH today. He has been having more low back pain with radiation of pain to both legs. He uses Oxycodone as needed. He thought his Diltiazem may have been contributing to the muscle pains so he stopped taking Diltiazem a month ago. However this has not affected the pain levels. His BP has been a little high.    Review of Systems  Cardiovascular: Negative.   Musculoskeletal: Positive for back pain and myalgias.  Neurological: Positive for weakness.       Objective:   Physical Exam  Constitutional: He is oriented to person, place, and time. He appears well-developed and well-nourished.  In pain, he stands rather than sits due to pain   Cardiovascular: Normal rate, regular rhythm, normal heart sounds and intact distal pulses.   Pulmonary/Chest: Effort normal and breath sounds normal. No respiratory distress. He has no wheezes. He has no rales.  Neurological: He is alert and oriented to person, place, and time.          Assessment & Plan:  I assured him that Diltiazem was not causing the muscle pains so he will get back on this daily. We will follow his HTN closely. For the back pain he will try Gabapentin 100 mg TID, and we can titrate up if needed. Check a TSH today for the thyroid level.  [name], MD

## 2017-04-09 NOTE — Addendum Note (Signed)
Addended by: Aggie Hacker A on: 04/09/2017 02:32 PM   Modules accepted: Orders

## 2017-04-12 ENCOUNTER — Other Ambulatory Visit: Payer: Self-pay | Admitting: Family Medicine

## 2017-04-12 MED ORDER — LEVOTHYROXINE SODIUM 175 MCG PO TABS: 175.0000 ug | ORAL_TABLET | Freq: Every day | ORAL | 0 refills | Status: DC

## 2017-04-16 LAB — CUP PACEART INCLINIC DEVICE CHECK
Brady Statistic AP VP Percent: 0.75 %
Brady Statistic AP VS Percent: 19.76 %
Brady Statistic AS VP Percent: 0.58 %
Implantable Lead Location: 753859
Implantable Lead Model: 5076
Implantable Lead Model: 5076
Lead Channel Impedance Value: 285 Ohm
Lead Channel Impedance Value: 418 Ohm
Lead Channel Pacing Threshold Amplitude: 0.5 V
Lead Channel Pacing Threshold Pulse Width: 0.4 ms
Lead Channel Sensing Intrinsic Amplitude: 2.875 mV
Lead Channel Sensing Intrinsic Amplitude: 4.875 mV
Lead Channel Sensing Intrinsic Amplitude: 7.25 mV
Lead Channel Setting Pacing Amplitude: 2.5 V
Lead Channel Setting Pacing Pulse Width: 0.4 ms
MDC IDC LEAD IMPLANT DT: 20180604
MDC IDC LEAD IMPLANT DT: 20180604
MDC IDC LEAD LOCATION: 753860
MDC IDC MSMT BATTERY REMAINING LONGEVITY: 177 mo
MDC IDC MSMT BATTERY VOLTAGE: 3.19 V
MDC IDC MSMT LEADCHNL RA PACING THRESHOLD AMPLITUDE: 0.625 V
MDC IDC MSMT LEADCHNL RA PACING THRESHOLD PULSEWIDTH: 0.4 ms
MDC IDC MSMT LEADCHNL RA SENSING INTR AMPL: 4.25 mV
MDC IDC MSMT LEADCHNL RV IMPEDANCE VALUE: 399 Ohm
MDC IDC MSMT LEADCHNL RV IMPEDANCE VALUE: 494 Ohm
MDC IDC PG IMPLANT DT: 20180604
MDC IDC SESS DTM: 20180912151504
MDC IDC SET LEADCHNL RA PACING AMPLITUDE: 1.5 V
MDC IDC SET LEADCHNL RV SENSING SENSITIVITY: 1.2 mV
MDC IDC STAT BRADY AS VS PERCENT: 78.91 %
MDC IDC STAT BRADY RA PERCENT PACED: 23.01 %
MDC IDC STAT BRADY RV PERCENT PACED: 1.33 %

## 2017-05-01 ENCOUNTER — Telehealth: Payer: Self-pay | Admitting: Family Medicine

## 2017-05-01 MED ORDER — OXYCODONE HCL 20 MG PO TABS: 20.0000 mg | ORAL_TABLET | Freq: Four times a day (QID) | ORAL | 0 refills | Status: DC | PRN

## 2017-05-01 NOTE — Telephone Encounter (Signed)
Patient needs Oxycodone HCl 20 MG TABS refilled

## 2017-05-02 NOTE — Telephone Encounter (Signed)
Script is ready for pick up here at front office and tried to reach pt, no answer, also letter was given.

## 2017-05-15 ENCOUNTER — Other Ambulatory Visit: Payer: Self-pay

## 2017-05-15 MED ORDER — FUROSEMIDE 20 MG PO TABS: 20.0000 mg | ORAL_TABLET | ORAL | 10 refills | Status: DC

## 2017-05-17 ENCOUNTER — Telehealth: Payer: Self-pay | Admitting: Family Medicine

## 2017-05-17 NOTE — Telephone Encounter (Signed)
I left a voice message for pt to return my call. We received a request from [facility] for 11 prescriptions, we need to confirm that pt does want to use the mail order. We have request at my desk with all of the medications needed.

## 2017-05-21 ENCOUNTER — Other Ambulatory Visit: Payer: Self-pay | Admitting: *Deleted

## 2017-05-21 ENCOUNTER — Other Ambulatory Visit: Payer: Self-pay | Admitting: Cardiovascular Disease

## 2017-05-21 DIAGNOSIS — I4892 Unspecified atrial flutter: Secondary | ICD-10-CM

## 2017-05-21 MED ORDER — DILTIAZEM HCL ER COATED BEADS 120 MG PO CP24: 120.0000 mg | ORAL_CAPSULE | Freq: Every day | ORAL | 2 refills | Status: DC

## 2017-05-21 MED ORDER — RIVAROXABAN 20 MG PO TABS: 20.0000 mg | ORAL_TABLET | Freq: Every day | ORAL | 10 refills | Status: DC

## 2017-05-22 NOTE — Telephone Encounter (Signed)
I spoke with pt and he is not going with mail order right now.

## 2017-06-14 ENCOUNTER — Telehealth: Payer: Self-pay | Admitting: Family Medicine

## 2017-06-14 NOTE — Telephone Encounter (Signed)
Copied from [place]. Topic: Quick Communication - See Telephone Encounter >> Jun 14, 2017  2:11 PM Lennox Solders wrote: CRM for notification. See Telephone encounter for: pt needs new rx oxycodone  06/14/17.

## 2017-06-14 NOTE — Telephone Encounter (Signed)
Controlled medication.

## 2017-06-17 MED ORDER — OXYCODONE HCL 20 MG PO TABS: 20.0000 mg | ORAL_TABLET | Freq: Four times a day (QID) | ORAL | 0 refills | Status: DC | PRN

## 2017-06-17 NOTE — Telephone Encounter (Signed)
Done

## 2017-06-17 NOTE — Telephone Encounter (Signed)
Sent to PCP for approval. Rx was last refilled 05/01/2017 disp 120 with no refills.

## 2017-06-17 NOTE — Telephone Encounter (Signed)
Pt advised and voiced understanding. Rx placed up front for pick up.  

## 2017-06-20 ENCOUNTER — Telehealth: Payer: Self-pay

## 2017-06-20 MED ORDER — ROPINIROLE HCL 5 MG PO TABS: 10.0000 mg | ORAL_TABLET | Freq: Every day | ORAL | 0 refills | Status: DC

## 2017-06-20 NOTE — Telephone Encounter (Signed)
Fax for a 90 day supply for Ropinirole HCL 5 MG Rx sent.

## 2017-06-24 ENCOUNTER — Other Ambulatory Visit: Payer: Self-pay | Admitting: Nurse Practitioner

## 2017-07-03 ENCOUNTER — Telehealth: Payer: Self-pay | Admitting: Cardiology

## 2017-07-03 ENCOUNTER — Other Ambulatory Visit: Payer: Self-pay | Admitting: Family Medicine

## 2017-07-03 ENCOUNTER — Ambulatory Visit (INDEPENDENT_AMBULATORY_CARE_PROVIDER_SITE_OTHER): Payer: Medicare Other | Admitting: *Deleted

## 2017-07-03 DIAGNOSIS — I495 Sick sinus syndrome: Secondary | ICD-10-CM | POA: Diagnosis not present

## 2017-07-03 NOTE — Telephone Encounter (Signed)
Sent to PCP ?

## 2017-07-03 NOTE — Telephone Encounter (Signed)
Spoke with pt and reminded pt of remote transmission that is due today. Pt verbalized understanding.   

## 2017-07-03 NOTE — Telephone Encounter (Signed)
Call in #[phone] with one rf 

## 2017-07-04 NOTE — Progress Notes (Signed)
Remote pacemaker transmission.   

## 2017-07-05 LAB — CUP PACEART REMOTE DEVICE CHECK
Battery Remaining Longevity: 175 mo
Battery Voltage: 3.17 V
Brady Statistic AP VP Percent: 0.54 %
Brady Statistic AS VS Percent: 82.99 %
Brady Statistic RA Percent Paced: 18.99 %
Implantable Lead Implant Date: 20180604
Implantable Lead Implant Date: 20180604
Implantable Lead Location: 753860
Implantable Pulse Generator Implant Date: 20180604
Lead Channel Impedance Value: 304 Ohm
Lead Channel Impedance Value: 399 Ohm
Lead Channel Impedance Value: 456 Ohm
Lead Channel Pacing Threshold Amplitude: 0.75 V
Lead Channel Pacing Threshold Pulse Width: 0.4 ms
Lead Channel Pacing Threshold Pulse Width: 0.4 ms
Lead Channel Sensing Intrinsic Amplitude: 3.75 mV
Lead Channel Setting Pacing Amplitude: 1.5 V
Lead Channel Setting Sensing Sensitivity: 1.2 mV
MDC IDC LEAD LOCATION: 753859
MDC IDC MSMT LEADCHNL RA IMPEDANCE VALUE: 475 Ohm
MDC IDC MSMT LEADCHNL RA SENSING INTR AMPL: 3.75 mV
MDC IDC MSMT LEADCHNL RV PACING THRESHOLD AMPLITUDE: 0.625 V
MDC IDC MSMT LEADCHNL RV SENSING INTR AMPL: 3.875 mV
MDC IDC MSMT LEADCHNL RV SENSING INTR AMPL: 3.875 mV
MDC IDC SESS DTM: 20181212202928
MDC IDC SET LEADCHNL RV PACING AMPLITUDE: 2.5 V
MDC IDC SET LEADCHNL RV PACING PULSEWIDTH: 0.4 ms
MDC IDC STAT BRADY AP VS PERCENT: 16.06 %
MDC IDC STAT BRADY AS VP PERCENT: 0.41 %
MDC IDC STAT BRADY RV PERCENT PACED: 0.95 %

## 2017-07-08 ENCOUNTER — Other Ambulatory Visit: Payer: Self-pay | Admitting: Family Medicine

## 2017-07-09 ENCOUNTER — Encounter: Payer: Self-pay | Admitting: Cardiology

## 2017-07-18 ENCOUNTER — Telehealth: Payer: Self-pay

## 2017-07-18 NOTE — Telephone Encounter (Signed)
Pharmacy is requesting for a 90 day supply Rx for gabapentin to be sent in. Last refilled 04/09/2017 for 90 day supply with 3 refills. Rx was printed call pharmacy to see if they have this or not.

## 2017-07-19 MED ORDER — GABAPENTIN 100 MG PO CAPS: 100.0000 mg | ORAL_CAPSULE | Freq: Three times a day (TID) | ORAL | 0 refills | Status: DC

## 2017-07-19 NOTE — Telephone Encounter (Signed)
Called pharmacy they did get the Rx sent on 04/09/2017 for the 30 day supply so they will need a 90 day supply sent. Rx sent.

## 2017-07-26 ENCOUNTER — Telehealth: Payer: Self-pay | Admitting: Family Medicine

## 2017-07-26 MED ORDER — OXYCODONE HCL 20 MG PO TABS: 20.0000 mg | ORAL_TABLET | Freq: Four times a day (QID) | ORAL | 0 refills | Status: DC | PRN

## 2017-07-26 NOTE — Telephone Encounter (Signed)
Done. He will need a PMV for any more

## 2017-07-26 NOTE — Telephone Encounter (Signed)
Last OV 04/09/2017. Rx was last refilled 06/17/2017. Sent to PCP.

## 2017-07-26 NOTE — Telephone Encounter (Signed)
Copied from [place] [id number]. Topic: Quick Communication - Rx Refill/Question >> Jul 26, 2017 10:21 AM Antonieta Iba C wrote:   Pt called in to request refill for Oxycodone HCl 10 MG     Agent: Please be advised that RX refills may take up to 3 business days. We ask that you follow-up with your pharmacy.

## 2017-07-29 NOTE — Telephone Encounter (Signed)
Called pt and left a detailed VM advising Rx was [facility]illed [facility] and to call back to schedule for PMV.

## 2017-08-05 ENCOUNTER — Ambulatory Visit: Payer: Self-pay | Admitting: *Deleted

## 2017-08-05 NOTE — Telephone Encounter (Signed)
Sent to PCP ?

## 2017-08-05 NOTE — Telephone Encounter (Signed)
Gout Flare:Patient reports he can't even move- when offered an appointment- he states he is on the floor with his foot elevated with ice on it. If he moves- he feels like someone is cutting it off with a knife. He states he had to crawl to the bathroom.  Patient can be reached at [phone]. [facility]. Patient states his son was given a new medication for Gout and he would like to use that.  Reason for Disposition . [1] SEVERE pain (e.g., excruciating, unable to do any normal activities) AND [2] not improved after 2 hours of pain medicine  Answer Assessment - Initial Assessment Questions 1. ONSET: "When did the pain start?"      1 week 2. LOCATION: "Where is the pain located?"      Right foot to ankle 3. PAIN: "How bad is the pain?"    (Scale 1-10; or mild, moderate, severe)   -  MILD (1-3): doesn't interfere with normal activities    -  MODERATE (4-7): interferes with normal activities (e.g., work or school) or awakens from sleep, limping    -  SEVERE (8-10): excruciating pain, unable to do any normal activities, unable to walk     Severe- patient can even walk 4. WORK OR EXERCISE: "Has there been any recent work or exercise that involved this part of the body?"      Gout flare 5. CAUSE: "What do you think is causing the foot pain?"     Gout started light- he state he took naproxen- has not fazed it 6. OTHER SYMPTOMS: "Do you have any other symptoms?" (e.g., leg pain, rash, fever, numbness)     Swollen-size of football 7. PREGNANCY: "Is there any chance you are pregnant?" "When was your last menstrual period?"     n/a  Protocols used: FOOT PAIN-A-AH

## 2017-08-06 ENCOUNTER — Ambulatory Visit (INDEPENDENT_AMBULATORY_CARE_PROVIDER_SITE_OTHER): Payer: Medicare Other | Admitting: Family Medicine

## 2017-08-06 ENCOUNTER — Emergency Department (HOSPITAL_COMMUNITY)
Admission: EM | Admit: 2017-08-06 | Discharge: 2017-08-06 | Disposition: A | Discharge status: Home / Self Care | Payer: Medicare Other | Attending: Emergency Medicine | Admitting: Emergency Medicine

## 2017-08-06 ENCOUNTER — Other Ambulatory Visit: Payer: Self-pay

## 2017-08-06 ENCOUNTER — Emergency Department (HOSPITAL_BASED_OUTPATIENT_CLINIC_OR_DEPARTMENT_OTHER): Admit: 2017-08-06 | Discharge: 2017-08-06 | Disposition: A | Discharge status: Home / Self Care | Payer: Medicare Other

## 2017-08-06 ENCOUNTER — Encounter: Payer: Self-pay | Admitting: Family Medicine

## 2017-08-06 ENCOUNTER — Emergency Department (HOSPITAL_COMMUNITY): Payer: Medicare Other

## 2017-08-06 ENCOUNTER — Encounter (HOSPITAL_COMMUNITY): Payer: Self-pay

## 2017-08-06 VITALS — BP 142/68 | HR 62 | Temp 97.3°F

## 2017-08-06 DIAGNOSIS — I110 Hypertensive heart disease with heart failure: Secondary | ICD-10-CM | POA: Insufficient documentation

## 2017-08-06 DIAGNOSIS — E039 Hypothyroidism, unspecified: Secondary | ICD-10-CM | POA: Diagnosis not present

## 2017-08-06 DIAGNOSIS — R2241 Localized swelling, mass and lump, right lower limb: Secondary | ICD-10-CM | POA: Diagnosis present

## 2017-08-06 DIAGNOSIS — R600 Localized edema: Secondary | ICD-10-CM | POA: Diagnosis not present

## 2017-08-06 DIAGNOSIS — Z79899 Other long term (current) drug therapy: Secondary | ICD-10-CM | POA: Insufficient documentation

## 2017-08-06 DIAGNOSIS — M25474 Effusion, right foot: Secondary | ICD-10-CM | POA: Diagnosis not present

## 2017-08-06 DIAGNOSIS — R05 Cough: Secondary | ICD-10-CM | POA: Insufficient documentation

## 2017-08-06 DIAGNOSIS — M109 Gout, unspecified: Secondary | ICD-10-CM | POA: Insufficient documentation

## 2017-08-06 DIAGNOSIS — Z7901 Long term (current) use of anticoagulants: Secondary | ICD-10-CM | POA: Diagnosis not present

## 2017-08-06 DIAGNOSIS — R059 Cough, unspecified: Secondary | ICD-10-CM

## 2017-08-06 DIAGNOSIS — R209 Unspecified disturbances of skin sensation: Secondary | ICD-10-CM | POA: Diagnosis not present

## 2017-08-06 DIAGNOSIS — Z950 Presence of cardiac pacemaker: Secondary | ICD-10-CM | POA: Diagnosis not present

## 2017-08-06 DIAGNOSIS — R609 Edema, unspecified: Secondary | ICD-10-CM | POA: Diagnosis not present

## 2017-08-06 DIAGNOSIS — M79671 Pain in right foot: Secondary | ICD-10-CM | POA: Diagnosis not present

## 2017-08-06 DIAGNOSIS — Z87891 Personal history of nicotine dependence: Secondary | ICD-10-CM | POA: Diagnosis not present

## 2017-08-06 DIAGNOSIS — I2510 Atherosclerotic heart disease of native coronary artery without angina pectoris: Secondary | ICD-10-CM | POA: Diagnosis not present

## 2017-08-06 DIAGNOSIS — I252 Old myocardial infarction: Secondary | ICD-10-CM | POA: Insufficient documentation

## 2017-08-06 DIAGNOSIS — I5032 Chronic diastolic (congestive) heart failure: Secondary | ICD-10-CM | POA: Insufficient documentation

## 2017-08-06 DIAGNOSIS — R0602 Shortness of breath: Secondary | ICD-10-CM | POA: Diagnosis not present

## 2017-08-06 LAB — BASIC METABOLIC PANEL
Anion gap: 10 (ref 5–15)
BUN: 13 mg/dL (ref 6–20)
CO2: 24 mmol/L (ref 22–32)
Calcium: 9.1 mg/dL (ref 8.9–10.3)
Chloride: 104 mmol/L (ref 101–111)
Creatinine, Ser: 0.94 mg/dL (ref 0.61–1.24)
GFR calc Af Amer: >60 mL/min (ref >60)
GFR calc non Af Amer: >60 mL/min (ref >60)
Glucose, Bld: 105 mg/dL — ABNORMAL HIGH (ref 65–99)
Potassium: 3.8 mmol/L (ref 3.5–5.1)
Sodium: 138 mmol/L (ref 135–145)

## 2017-08-06 LAB — CBC WITH DIFFERENTIAL/PLATELET
Basophils Absolute: 0.1 10*3/uL (ref 0.0–0.1)
Basophils Relative: 1 %
Eosinophils Absolute: 0.7 10*3/uL (ref 0.0–0.7)
Eosinophils Relative: 6 %
HCT: 38.5 % — ABNORMAL LOW (ref 39.0–52.0)
Hemoglobin: 11.4 g/dL — ABNORMAL LOW (ref 13.0–17.0)
Lymphocytes Relative: 21 %
Lymphs Abs: 2.4 10*3/uL (ref 0.7–4.0)
MCH: 22.8 pg — ABNORMAL LOW (ref 26.0–34.0)
MCHC: 29.6 g/dL — ABNORMAL LOW (ref 30.0–36.0)
MCV: 77 fL — ABNORMAL LOW (ref 78.0–100.0)
Monocytes Absolute: 1.3 10*3/uL — ABNORMAL HIGH (ref 0.1–1.0)
Monocytes Relative: 11 %
Neutro Abs: 7.2 10*3/uL (ref 1.7–7.7)
Neutrophils Relative %: 61 %
Platelets: 331 10*3/uL (ref 150–400)
RBC: 5 MIL/uL (ref 4.22–5.81)
RDW: 17.5 % — ABNORMAL HIGH (ref 11.5–15.5)
WBC: 11.6 10*3/uL — ABNORMAL HIGH (ref 4.0–10.5)

## 2017-08-06 MED ORDER — INDOMETHACIN 50 MG PO CAPS: 50.0000 mg | ORAL_CAPSULE | Freq: Three times a day (TID) | ORAL | 0 refills | Status: AC

## 2017-08-06 MED ORDER — MORPHINE SULFATE (PF) 4 MG/ML IV SOLN
4.0000 mg | Freq: Once | INTRAVENOUS | Status: AC
  Administered 2017-08-06: 4 mg via INTRAMUSCULAR
  Filled 2017-08-06: qty 1

## 2017-08-06 MED ORDER — INDOMETHACIN 25 MG PO CAPS
25.0000 mg | ORAL_CAPSULE | Freq: Once | ORAL | Status: AC
  Administered 2017-08-06: 25 mg via ORAL
  Filled 2017-08-06: qty 1

## 2017-08-06 NOTE — ED Triage Notes (Signed)
PT right foot swelling and tingling since Thursday. This morning when he woke up he reports pain to right foot that radiated up leg as well as right foot swelling. That was cold to the touch. He went to pcp and was sent here d/t pulse not palpated. Pulse present with doppler in triage, foot warm, color WNL. Significant amount of swelling to foot.

## 2017-08-06 NOTE — ED Notes (Signed)
Patient transported to X-ray 

## 2017-08-06 NOTE — Patient Instructions (Signed)
Go directly to come in the emergency room now for evaluation

## 2017-08-06 NOTE — Progress Notes (Signed)
[name] is a 77 year old married male nonsmoker.......... however he smells of smoke. He says his wife smokes...Christopher KitchenMarland KitchenMarland [name] who comes in today with a five-day history of swelling of his right lower extremity  He states he woke up last Friday morning and his right ankle was swollen. He's thought it might be gout. He's had a history of gout in the past. He usually takes Naprosyn and that's all sleep pain within a day or 2. He's never been on allopurinol.  He has an extensive history of underlying vascular disease with hyperlipidemia and known coronary artery disease. He says he's been taking OxyContin every 4 hours for severe pain although the OxyContin is not on Dr. Lucendia Herrlich med list  BP (!) 142/68 (BP Location: Left Arm, Patient Position: Sitting, Cuff Size: Normal)   Pulse 62   Temp (!) 97.3 F (36.3 C) (Oral)   SpO2 97%  He's a well-developed overweight male with acute right leg swelling. The skin is cold and I cannot palpate a pulse  Cold right foot............ his wife will take him to cut emergency room stat

## 2017-08-06 NOTE — ED Notes (Signed)
Pt stable,ambulatory, and verbalizes understanding of D/C instructions.   
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
## 2017-08-06 NOTE — Progress Notes (Signed)
Right lower extremity venous duplex has been completed. Negative for DVT.  Results were given to Dr. Leonette Monarch.  08/06/17 5:52 PM [name] RVT

## 2017-08-07 ENCOUNTER — Telehealth: Payer: Self-pay | Admitting: Family Medicine

## 2017-08-07 MED ORDER — METHYLPREDNISOLONE 4 MG PO TBPK: ORAL_TABLET | ORAL | 0 refills | Status: DC

## 2017-08-07 NOTE — Telephone Encounter (Signed)
Called and spoke with pt's wife. Wife advised and voiced understanding [facility] [place]. Rx has been sent.

## 2017-08-07 NOTE — Telephone Encounter (Signed)
The best thing for an acute flare is steroids. Call in a Medrol dose pack. We can discuss preventive medications later

## 2017-08-07 NOTE — Telephone Encounter (Signed)
Copied from [place] [id number]. Topic: Quick Communication - See Telephone Encounter >> Aug 07, 2017  3:37 PM Boyd Kerbs wrote: CRM for notification. See Telephone encounter for:   Patient called about about medication change and needs the medicine bad. He wants what Dr. Sarajane Jews recommended.    Patient wants Wilburn Cornelia to call him   [id number] [place] [place] - [address] [place] [address] Phone: [phone] Fax: [phone]     08/07/17.

## 2017-08-07 NOTE — Addendum Note (Signed)
Addended by: Myriam Forehand on: 08/07/2017 09:25 AM   Modules accepted: Orders

## 2017-08-08 NOTE — Telephone Encounter (Signed)
Called pt and left a VM to call back.  

## 2017-08-08 NOTE — Telephone Encounter (Signed)
Pt was requesting a steroid which was sent in yesterday. OK to close out this message.

## 2017-08-22 ENCOUNTER — Telehealth: Payer: Self-pay | Admitting: Cardiovascular Disease

## 2017-08-22 ENCOUNTER — Telehealth: Payer: Self-pay | Admitting: Family Medicine

## 2017-08-22 NOTE — Telephone Encounter (Signed)
I spoke with pt who reports he no longer has Medicaid and cannot afford Xarelto. Cost is now $135 per month. Pt has not applied for assistance program in the past and is willing to complete paperwork to see if he would qualify for assistance.  Will leave paperwork at front desk for pt. He is aware to return completed paperwork to the office.

## 2017-08-22 NOTE — Telephone Encounter (Signed)
New message  135 Pt c/o medication issue:  1. Name of Medication: rivaroxaban (XARELTO) 20 MG TABS tablet  2. How are you currently taking this medication (dosage and times per day)? Take 1 tablet (20 mg total) by mouth daily with supper.  3. Are you having a reaction (difficulty breathing--STAT)? no  4. What is your medication issue? Pt says that his meds are getting too expensive and would like the cheapest alternative. Please call

## 2017-08-22 NOTE — Telephone Encounter (Signed)
Copied from [place]. Topic: Quick Communication - See Telephone Encounter >> Aug 22, 2017  2:40 PM Ether Griffins B wrote: CRM for notification. See Telephone encounter for:  Pt calling in his gout in right foot has not gone down much and he is wanting to know if something can be called in. It is still hurting.  08/22/17.

## 2017-08-23 ENCOUNTER — Ambulatory Visit (HOSPITAL_COMMUNITY): Payer: Medicare Other | Admitting: Psychiatry

## 2017-08-23 NOTE — Telephone Encounter (Signed)
Sent to PCP ?

## 2017-08-27 MED ORDER — METHYLPREDNISOLONE 4 MG PO TBPK: ORAL_TABLET | ORAL | 0 refills | Status: DC

## 2017-08-27 NOTE — Telephone Encounter (Signed)
Call in a Medrol dose pack  

## 2017-08-27 NOTE — Telephone Encounter (Signed)
Called and spoke with pt. Pt advised and voiced understanding. Rx has been sent.

## 2017-08-28 NOTE — Telephone Encounter (Signed)
**Note De-Identified  Obfuscation** I have completed the providers part of the [place] pt assistance application for Xarelto and have placed it in the yellow folder in the PA Dept.

## 2017-08-29 ENCOUNTER — Telehealth: Payer: Self-pay | Admitting: Family Medicine

## 2017-08-29 NOTE — Telephone Encounter (Signed)
°  Relation to pt self  Call back number: [phone]   Reason for call:  Spouse requesting Oxycodone HCl 20 MG TABS, informed patient please allow 48 to 72 hour turn around time, please advise

## 2017-08-30 NOTE — Telephone Encounter (Signed)
Last OV 04/09/2017   Last refilled 06/03/2017 disp 90 with 1 refill   Sent to PCP

## 2017-08-30 NOTE — Telephone Encounter (Signed)
Pt advised and voiced understanding. Pt is now scheduled for an OV

## 2017-08-30 NOTE — Telephone Encounter (Signed)
No refills without a PMV. He was told about this on 07-26-17.

## 2017-08-31 ENCOUNTER — Ambulatory Visit (HOSPITAL_COMMUNITY): Payer: Medicare Other | Admitting: Psychiatry

## 2017-09-02 ENCOUNTER — Encounter: Payer: Self-pay | Admitting: Family Medicine

## 2017-09-02 ENCOUNTER — Encounter: Payer: Self-pay | Admitting: Neurology

## 2017-09-02 ENCOUNTER — Ambulatory Visit (INDEPENDENT_AMBULATORY_CARE_PROVIDER_SITE_OTHER): Payer: Medicare Other | Admitting: Family Medicine

## 2017-09-02 VITALS — BP 140/78 | HR 60 | Temp 97.6°F | Wt 234.4 lb

## 2017-09-02 DIAGNOSIS — M5440 Lumbago with sciatica, unspecified side: Secondary | ICD-10-CM

## 2017-09-02 DIAGNOSIS — F1190 Opioid use, unspecified, uncomplicated: Secondary | ICD-10-CM | POA: Diagnosis not present

## 2017-09-02 DIAGNOSIS — G3281 Cerebellar ataxia in diseases classified elsewhere: Secondary | ICD-10-CM

## 2017-09-02 MED ORDER — INDOMETHACIN 50 MG PO CAPS: 50.0000 mg | ORAL_CAPSULE | Freq: Three times a day (TID) | ORAL | 5 refills | Status: DC | PRN

## 2017-09-02 MED ORDER — OXYCODONE HCL 20 MG PO TABS: 20.0000 mg | ORAL_TABLET | Freq: Four times a day (QID) | ORAL | 0 refills | Status: DC | PRN

## 2017-09-02 MED ORDER — PREDNISONE 10 MG PO TABS: 30.0000 mg | ORAL_TABLET | Freq: Every day | ORAL | 0 refills | Status: DC

## 2017-09-02 NOTE — Progress Notes (Signed)
   Subjective:    Patient ID: [name], male    DOB: 22-Feb-1941, 77 y.o.   MRN: [id number]  HPI Here for pain management. His back pain has been stable but lately he has also been dealing with a lot of gout pain in the right foot and ankle.  Indication for chronic opioid: low back pain  Medication and dose: oxycodone 20 mg  # pills per month: 120 Last UDS date: 09-02-17 Pain contract signed (Y/N): 09-02-17 Date narcotic database last reviewed (include red flags): 09-02-17 In addition he has been experiencing a lot of dizzinessm and he feels his balance is off. He is using a cane to help with balance.    Review of Systems  Constitutional: Negative.   Respiratory: Negative.   Cardiovascular: Negative.   Musculoskeletal: Positive for arthralgias and back pain.  Neurological: Positive for dizziness.       Objective:   Physical Exam  Constitutional: He is oriented to person, place, and time. He appears well-developed and well-nourished.  Cardiovascular: Normal rate, regular rhythm, normal heart sounds and intact distal pulses.  Pulmonary/Chest: Effort normal and breath sounds normal. No respiratory distress. He has no wheezes. He has no rales.  Neurological: He is alert and oriented to person, place, and time. No cranial nerve deficit. He exhibits normal muscle tone.  His gait is unsteady           Assessment & Plan:  His back pain is stable. Oxycodone was refilled. For the gout pain we will start him on Prednisone 30 mg daily. For the dizziness we will refer him to Neurology.  [name], MD

## 2017-09-03 ENCOUNTER — Telehealth: Payer: Self-pay | Admitting: Family Medicine

## 2017-09-03 NOTE — Telephone Encounter (Signed)
Relation to pt: self Call back number:[phone] Pharmacy  [id number] [place], [place] - [address] [phone] (Phone) [phone] (Fax)      Reason for call:  Patient called to inform insurance has changed from [facility] to [employer] (demographics have been updated). Patient states medication that was sent yesterday thru [facility] for Oxycodone HCl 20 MG TABS and predniSONE (DELTASONE) 10 MG tablet cost $58.00. Patient would like Rx resent thru [facility] Advantage, please advise when Rx resent.

## 2017-09-03 NOTE — Telephone Encounter (Signed)
Copied from [place]. Topic: Quick Communication - See Telephone Encounter >> Sep 03, 2017  3:17 PM Vernona Rieger wrote: CRM for notification. See Telephone encounter for:   09/03/17.   Pt went to the pharmacy to get his oxycodone and they would only let him have a 7 day supply. He said he is suppose to have either a 30/60/90 supply. He said he has a new insurance ([employer]) he said that Dr Sarajane Jews needs to call his insurance and get a PA on this. Call back is [phone] [id number] [place], [place] - [address]

## 2017-09-04 ENCOUNTER — Ambulatory Visit: Payer: Self-pay

## 2017-09-04 NOTE — Telephone Encounter (Signed)
Called pt's pharmacy. Pt has already pick up a 7 day supply 30 pills that is all that insurance would cover. Pharmacist stated that she would fax over a PA. Asked for her to fax PA to [phone] fax at my pod.

## 2017-09-04 NOTE — Telephone Encounter (Signed)
  Pt. Saw doctor 09/02/17. Pt. States he has gout in right foot. Nothing is helping the pain. Insurance only gave him 7 day supply of pain medication. Answer Assessment - Initial Assessment Questions 1. ONSET: "When did the pain start?"      2 WEEKS AGO 2. LOCATION: "Where is the pain located?"      Right foot 3. PAIN: "How bad is the pain?"    (Scale 1-10; or mild, moderate, severe)   -  MILD (1-3): doesn't interfere with normal activities    -  MODERATE (4-7): interferes with normal activities (e.g., work or school) or awakens from sleep, limping    -  SEVERE (8-10): excruciating pain, unable to do any normal activities, unable to walk     20 4. WORK OR EXERCISE: "Has there been any recent work or exercise that involved this part of the body?"      No 5. CAUSE: "What do you think is causing the foot pain?"     Gout 6. OTHER SYMPTOMS: "Do you have any other symptoms?" (e.g., leg pain, rash, fever, numbness)     Foot is cold 7. PREGNANCY: "Is there any chance you are pregnant?" "When was your last menstrual period?"     No  Protocols used: FOOT PAIN-A-AH

## 2017-09-05 LAB — PAIN MGMT, PROFILE 8 W/CONF, U
6 Acetylmorphine: NEGATIVE ng/mL (ref <10)
ALPHAHYDROXYALPRAZOLAM: NEGATIVE ng/mL (ref <25)
ALPHAHYDROXYMIDAZOLAM: NEGATIVE ng/mL (ref <50)
Alcohol Metabolites: NEGATIVE ng/mL (ref <500)
Alphahydroxytriazolam: NEGATIVE ng/mL (ref <50)
Aminoclonazepam: 534 ng/mL — ABNORMAL HIGH (ref <25)
Amphetamines: NEGATIVE ng/mL (ref <500)
BENZODIAZEPINES: POSITIVE ng/mL — AB (ref <100)
BUPRENORPHINE, URINE: NEGATIVE ng/mL (ref <5)
BUPRENORPHINE: NEGATIVE ng/mL (ref <2)
COCAINE METABOLITE: NEGATIVE ng/mL (ref <150)
CREATININE: 274.3 mg/dL
Codeine: NEGATIVE ng/mL (ref <50)
HYDROMORPHONE: 1177 ng/mL — AB (ref <50)
HYDROXYETHYLFLURAZEPAM: NEGATIVE ng/mL (ref <50)
Hydrocodone: 2645 ng/mL — ABNORMAL HIGH (ref <50)
LORAZEPAM: NEGATIVE ng/mL (ref <50)
MARIJUANA METABOLITE: NEGATIVE ng/mL (ref <20)
MDMA: NEGATIVE ng/mL (ref <500)
MORPHINE: NEGATIVE ng/mL (ref <50)
Norbuprenorphine: NEGATIVE ng/mL (ref <2)
Nordiazepam: NEGATIVE ng/mL (ref <50)
Norhydrocodone: 2502 ng/mL — ABNORMAL HIGH (ref <50)
Opiates: POSITIVE ng/mL — AB (ref <100)
Oxazepam: NEGATIVE ng/mL (ref <50)
Oxidant: NEGATIVE ug/mL (ref <200)
Oxycodone: NEGATIVE ng/mL (ref <100)
PH: 6.32 (ref 4.5–9.0)
TEMAZEPAM: NEGATIVE ng/mL (ref <50)

## 2017-09-09 ENCOUNTER — Telehealth: Payer: Self-pay | Admitting: Family Medicine

## 2017-09-09 ENCOUNTER — Telehealth: Payer: Self-pay

## 2017-09-09 ENCOUNTER — Ambulatory Visit (INDEPENDENT_AMBULATORY_CARE_PROVIDER_SITE_OTHER): Payer: PPO | Admitting: Family Medicine

## 2017-09-09 ENCOUNTER — Encounter: Payer: Self-pay | Admitting: Family Medicine

## 2017-09-09 VITALS — BP 128/76 | HR 55 | Temp 97.5°F | Ht 71.0 in | Wt 235.7 lb

## 2017-09-09 DIAGNOSIS — D509 Iron deficiency anemia, unspecified: Secondary | ICD-10-CM | POA: Diagnosis not present

## 2017-09-09 DIAGNOSIS — M1000 Idiopathic gout, unspecified site: Secondary | ICD-10-CM | POA: Diagnosis not present

## 2017-09-09 LAB — CBC WITH DIFFERENTIAL/PLATELET
BASOS PCT: 1.1 % (ref 0.0–3.0)
Basophils Absolute: 0.1 10*3/uL (ref 0.0–0.1)
EOS ABS: 0.6 10*3/uL (ref 0.0–0.7)
EOS PCT: 7.5 % — AB (ref 0.0–5.0)
HEMATOCRIT: 32 % — AB (ref 39.0–52.0)
HEMOGLOBIN: 9.7 g/dL — AB (ref 13.0–17.0)
LYMPHS PCT: 25.5 % (ref 12.0–46.0)
Lymphs Abs: 2 10*3/uL (ref 0.7–4.0)
MCHC: 30.3 g/dL (ref 30.0–36.0)
MCV: 72.7 fl — ABNORMAL LOW (ref 78.0–100.0)
MONOS PCT: 11.1 % (ref 3.0–12.0)
Monocytes Absolute: 0.9 10*3/uL (ref 0.1–1.0)
Neutro Abs: 4.2 10*3/uL (ref 1.4–7.7)
Neutrophils Relative %: 54.8 % (ref 43.0–77.0)
Platelets: 286 10*3/uL (ref 150.0–400.0)
RBC: 4.4 Mil/uL (ref 4.22–5.81)
RDW: 18 % — AB (ref 11.5–15.5)
WBC: 7.7 10*3/uL (ref 4.0–10.5)

## 2017-09-09 LAB — URIC ACID: Uric Acid, Serum: 7.2 mg/dL (ref 4.0–7.8)

## 2017-09-09 MED ORDER — COLCHICINE 0.6 MG PO TABS: 0.6000 mg | ORAL_TABLET | Freq: Four times a day (QID) | ORAL | 5 refills | Status: DC | PRN

## 2017-09-09 MED ORDER — OXYCODONE HCL 20 MG PO TABS: 20.0000 mg | ORAL_TABLET | Freq: Four times a day (QID) | ORAL | 0 refills | Status: DC | PRN

## 2017-09-09 NOTE — Telephone Encounter (Signed)
Pt came in for OV today. Issue was addressed during visit.

## 2017-09-09 NOTE — Telephone Encounter (Signed)
Spoke with Marjory Lies, pharmacist; Dx: low back pain M54.5

## 2017-09-09 NOTE — Telephone Encounter (Signed)
**Note De-Identified  Obfuscation** The pt returned his [place] pt assistance application for his Xarelto. I have completed the provider part of the application and Dr [name] has signed. I have faxed all to [place] pt assistance program.

## 2017-09-09 NOTE — Telephone Encounter (Signed)
Copied from [place]. Topic: Quick Communication - See Telephone Encounter >> Sep 09, 2017 11:27 AM Synthia Innocent wrote: CRM for notification. See Telephone encounter for: [facility] [place] needs dx code for Oxycodone HCl 20 MG   09/09/17.

## 2017-09-09 NOTE — Progress Notes (Signed)
   Subjective:    Patient ID: [name], male    DOB: 05/28/1941, 77 y.o.   MRN: [id number]  HPI Here to follow up on pain and swelling in the right foot and ankle. He has tried Prednisone with no relief. No fever.    Review of Systems  Constitutional: Negative.   Respiratory: Negative.   Cardiovascular: Negative.   Musculoskeletal: Positive for arthralgias and joint swelling.  Neurological: Negative.        Objective:   Physical Exam  Constitutional: He is oriented to person, place, and time. He appears well-developed and well-nourished.  Eyes: Conjunctivae are normal.  Cardiovascular: Normal rate, regular rhythm, normal heart sounds and intact distal pulses.  Pulmonary/Chest: Effort normal and breath sounds normal. No respiratory distress. He has no wheezes. He has no rales.  Musculoskeletal:  Right foot and ankle are swollen, warm, pink, and tender. No streaking   Neurological: He is alert and oriented to person, place, and time.          Assessment & Plan:  Gout. We will check a CBC and a uric acid level today. Try Colchicine as needed.  [name], MD

## 2017-09-10 NOTE — Telephone Encounter (Signed)
Prior auth sent to [id number].

## 2017-09-11 NOTE — Addendum Note (Signed)
Addended by: [name] A on: 09/11/2017 08:46 AM   Modules accepted: Orders

## 2017-09-12 ENCOUNTER — Telehealth: Payer: Self-pay | Admitting: Family Medicine

## 2017-09-12 NOTE — Telephone Encounter (Signed)
Patient called in for lab results.  Result note was not in triage folder, therefore TE encounter created. / Notes recorded by Myriam Forehand, CMA on 09/11/2017 at 5:21 PM EST Called pt and left a VM to call back. CRM created. ------  Notes recorded by [name], MD on 09/11/2017 at 8:48 AM EST Normal except he is anemic. His Hgb has dropped steadily for the past 18 months. Have him set up a lab appt to check his iron levels, etc. If these are normal, I will refer him to GI because he may be losing blood through the GI tract / Follow up lab appointment made for 2 weeks, even thought it was not specifically listed as a time frame.  I did advise the patient he would be called back if the lab appointment needed to be changed. / Patient verbalizes understanding.  Note sent to practice to follow up.

## 2017-09-18 NOTE — Telephone Encounter (Signed)
Received a letter from [employer] via fax stating that the pts application is incomplete.  I called the number listed in the letter and s/w Shawnya. Per Corlis Hove the pts address was unreadable, they need a copy of his insurance cards and page 2 needs to be re-written as they cannot see entire page.  I have made corrections and faxed application back to [employer] with ID [id number] written on cover letter.

## 2017-09-19 ENCOUNTER — Other Ambulatory Visit: Payer: Self-pay | Admitting: Family Medicine

## 2017-09-26 ENCOUNTER — Other Ambulatory Visit (INDEPENDENT_AMBULATORY_CARE_PROVIDER_SITE_OTHER): Payer: PPO

## 2017-09-26 DIAGNOSIS — D509 Iron deficiency anemia, unspecified: Secondary | ICD-10-CM | POA: Diagnosis not present

## 2017-09-26 LAB — FERRITIN: FERRITIN: 2.3 ng/mL — AB (ref 22.0–322.0)

## 2017-09-26 LAB — IRON: IRON: 12 ug/dL — AB (ref 42–165)

## 2017-09-26 LAB — FOLATE: Folate: 23.4 ng/mL (ref >5.9)

## 2017-09-26 LAB — VITAMIN B12: VITAMIN B 12: 141 pg/mL — AB (ref 211–911)

## 2017-09-26 NOTE — Addendum Note (Signed)
Addended by: [name] A on: 09/26/2017 01:39 PM   Modules accepted: Orders

## 2017-09-30 ENCOUNTER — Ambulatory Visit (INDEPENDENT_AMBULATORY_CARE_PROVIDER_SITE_OTHER): Payer: PPO | Admitting: Family Medicine

## 2017-09-30 ENCOUNTER — Telehealth: Payer: Self-pay | Admitting: Family Medicine

## 2017-09-30 DIAGNOSIS — E538 Deficiency of other specified B group vitamins: Secondary | ICD-10-CM

## 2017-09-30 MED ORDER — CYANOCOBALAMIN 1000 MCG/ML IJ SOLN
1000.0000 ug | Freq: Once | INTRAMUSCULAR | Status: AC
  Administered 2017-09-30: 1000 ug via INTRAMUSCULAR

## 2017-09-30 NOTE — Telephone Encounter (Signed)
Patient was here for first Vitamin B 12 injection, which he did get today. How often and and how long will these injections be given?

## 2017-09-30 NOTE — Telephone Encounter (Signed)
Give the B12 shots weekly for 12 weeks and then recheck a level

## 2017-09-30 NOTE — Progress Notes (Signed)
Per orders of Dr. Fry, injection of Vitamin B 12 given by NIMMONS, SYLVIA ANN. Patient tolerated injection well. 

## 2017-09-30 NOTE — Telephone Encounter (Signed)
Sent to PCP to advise how often pt should get B 12 shots?  Thanks

## 2017-10-01 NOTE — Telephone Encounter (Signed)
Pt advised and voiced understanding.   

## 2017-10-02 ENCOUNTER — Ambulatory Visit (INDEPENDENT_AMBULATORY_CARE_PROVIDER_SITE_OTHER): Payer: PPO | Admitting: *Deleted

## 2017-10-02 DIAGNOSIS — I495 Sick sinus syndrome: Secondary | ICD-10-CM | POA: Diagnosis not present

## 2017-10-02 NOTE — Progress Notes (Signed)
Remote pacemaker transmission.   

## 2017-10-03 ENCOUNTER — Encounter: Payer: Self-pay | Admitting: Cardiology

## 2017-10-04 ENCOUNTER — Other Ambulatory Visit: Payer: Self-pay | Admitting: Family Medicine

## 2017-10-07 ENCOUNTER — Telehealth: Payer: Self-pay | Admitting: Family Medicine

## 2017-10-07 ENCOUNTER — Other Ambulatory Visit: Payer: PPO

## 2017-10-07 NOTE — Telephone Encounter (Signed)
Copied from [place] [id number]. Topic: Quick Communication - Rx Refill/Question >> Oct 07, 2017 10:56 AM [name] wrote: Medication: Oxycodone HCl 20 MG TABS   Has the patient contacted their pharmacy? Yes.     (Agent: If no, request that the patient contact the pharmacy for the refill.)   Preferred Pharmacy (with phone number or street name): [facility] [place] [facility] [place] [facility]    Agent: Please be advised that RX refills may take up to 3 business days. We ask that you follow-up with your pharmacy.

## 2017-10-07 NOTE — Telephone Encounter (Signed)
Sent to PCP   Last refilled 09/09/2017 disp 120 with no refills

## 2017-10-07 NOTE — Telephone Encounter (Signed)
Oxycodone LOV: 09/09/17 PCP: [place]: [facility], [place]

## 2017-10-08 ENCOUNTER — Other Ambulatory Visit: Payer: Self-pay | Admitting: Family

## 2017-10-08 ENCOUNTER — Telehealth: Payer: Self-pay | Admitting: Family Medicine

## 2017-10-08 ENCOUNTER — Telehealth: Payer: Self-pay

## 2017-10-08 DIAGNOSIS — D509 Iron deficiency anemia, unspecified: Secondary | ICD-10-CM

## 2017-10-08 NOTE — Telephone Encounter (Signed)
[facility]  Pt is requesting new RX: we need a new Rx sent for oxycodone for this month   Last OV 09/09/2017  Last refilled 09/09/2017 120 with no refills   Sent to PCP

## 2017-10-08 NOTE — Telephone Encounter (Signed)
He will need a PMV for refills

## 2017-10-08 NOTE — Telephone Encounter (Unsigned)
Copied from Mobile [phone]. Topic: Quick Communication - Rx Refill/Question >> Oct 07, 2017 10:56 AM Eulogio Bear J wrote: Medication: Oxycodone HCl 20 MG TABS   Has the patient contacted their pharmacy? Yes.     (Agent: If no, request that the patient contact the pharmacy for the refill.)   Preferred Pharmacy (with phone number or street name): [facility] [place] [facility] [place] [facility]    Agent: Please be advised that RX refills may take up to 3 business days. We ask that you follow-up with your pharmacy.

## 2017-10-09 ENCOUNTER — Inpatient Hospital Stay: Payer: PPO

## 2017-10-09 ENCOUNTER — Encounter: Payer: Self-pay | Admitting: Family

## 2017-10-09 ENCOUNTER — Other Ambulatory Visit: Payer: Self-pay

## 2017-10-09 ENCOUNTER — Inpatient Hospital Stay: Payer: PPO | Attending: Family | Admitting: Family

## 2017-10-09 VITALS — BP 137/65 | HR 62 | Temp 97.4°F | Resp 20 | Wt 230.0 lb

## 2017-10-09 DIAGNOSIS — Z7901 Long term (current) use of anticoagulants: Secondary | ICD-10-CM | POA: Insufficient documentation

## 2017-10-09 DIAGNOSIS — Z79899 Other long term (current) drug therapy: Secondary | ICD-10-CM | POA: Diagnosis not present

## 2017-10-09 DIAGNOSIS — M109 Gout, unspecified: Secondary | ICD-10-CM | POA: Insufficient documentation

## 2017-10-09 DIAGNOSIS — D500 Iron deficiency anemia secondary to blood loss (chronic): Secondary | ICD-10-CM | POA: Insufficient documentation

## 2017-10-09 DIAGNOSIS — R42 Dizziness and giddiness: Secondary | ICD-10-CM | POA: Insufficient documentation

## 2017-10-09 DIAGNOSIS — Z87891 Personal history of nicotine dependence: Secondary | ICD-10-CM | POA: Diagnosis not present

## 2017-10-09 DIAGNOSIS — R0602 Shortness of breath: Secondary | ICD-10-CM | POA: Diagnosis not present

## 2017-10-09 DIAGNOSIS — D509 Iron deficiency anemia, unspecified: Secondary | ICD-10-CM

## 2017-10-09 LAB — CBC WITH DIFFERENTIAL (CANCER CENTER ONLY)
BASOS ABS: 0.2 10*3/uL — AB (ref 0.0–0.1)
Basophils Relative: 2 %
EOS PCT: 7 %
Eosinophils Absolute: 0.7 10*3/uL — ABNORMAL HIGH (ref 0.0–0.5)
HCT: 36.5 % — ABNORMAL LOW (ref 38.7–49.9)
Hemoglobin: 10.3 g/dL — ABNORMAL LOW (ref 13.0–17.1)
LYMPHS PCT: 23 %
Lymphs Abs: 2.2 10*3/uL (ref 0.9–3.3)
MCH: 20.7 pg — ABNORMAL LOW (ref 28.0–33.4)
MCHC: 28.2 g/dL — ABNORMAL LOW (ref 32.0–35.9)
MCV: 73.4 fL — AB (ref 82.0–98.0)
MONO ABS: 1 10*3/uL — AB (ref 0.1–0.9)
Monocytes Relative: 11 %
Neutro Abs: 5.4 10*3/uL (ref 1.5–6.5)
Neutrophils Relative %: 57 %
PLATELETS: 297 10*3/uL (ref 145–400)
RBC: 4.97 MIL/uL (ref 4.20–5.70)
RDW: 17.9 % — AB (ref 11.1–15.7)
WBC: 9.5 10*3/uL (ref 4.0–10.0)

## 2017-10-09 LAB — IRON AND TIBC
IRON: 17 ug/dL — AB (ref 42–163)
Saturation Ratios: 3 % — ABNORMAL LOW (ref 42–163)
TIBC: 505 ug/dL — AB (ref 202–409)
UIBC: 488 ug/dL

## 2017-10-09 LAB — CMP (CANCER CENTER ONLY)
ALK PHOS: 71 U/L (ref 26–84)
ALT: 29 U/L (ref 10–47)
ANION GAP: 7 (ref 5–15)
AST: 31 U/L (ref 11–38)
Albumin: 4 g/dL (ref 3.5–5.0)
BUN: 14 mg/dL (ref 7–22)
CALCIUM: 9.4 mg/dL (ref 8.0–10.3)
CO2: 30 mmol/L (ref 18–33)
Chloride: 105 mmol/L (ref 98–108)
Creatinine: 1.4 mg/dL — ABNORMAL HIGH (ref 0.60–1.20)
GLUCOSE: 133 mg/dL — AB (ref 73–118)
POTASSIUM: 4 mmol/L (ref 3.3–4.7)
SODIUM: 142 mmol/L (ref 128–145)
Total Bilirubin: 0.8 mg/dL (ref 0.2–1.6)
Total Protein: 7.3 g/dL (ref 6.4–8.1)

## 2017-10-09 LAB — FERRITIN: Ferritin: 4 ng/mL — ABNORMAL LOW (ref 22–316)

## 2017-10-09 LAB — SAMPLE TO BLOOD BANK

## 2017-10-09 LAB — RETICULOCYTES
RBC.: 4.97 MIL/uL (ref 4.20–5.82)
RETIC CT PCT: 1.5 % (ref 0.8–1.8)
Retic Count, Absolute: 74.6 10*3/uL (ref 34.8–93.9)

## 2017-10-09 LAB — LACTATE DEHYDROGENASE: LDH: 227 U/L (ref 125–245)

## 2017-10-09 LAB — SAVE SMEAR

## 2017-10-09 MED ORDER — OXYCODONE HCL 20 MG PO TABS: 20.0000 mg | ORAL_TABLET | Freq: Four times a day (QID) | ORAL | 0 refills | Status: DC | PRN

## 2017-10-09 NOTE — Telephone Encounter (Signed)
Sent refills in for March and April

## 2017-10-09 NOTE — Telephone Encounter (Signed)
Pt states he had his pain management appt on 09/02/17.  Pt would like [facility] called to   [id number] [place] [place] - [address] [phone] (Phone) [phone] (Fax)   Pt states he has one tab left and needs today please.

## 2017-10-09 NOTE — Telephone Encounter (Signed)
Called and spoke with pt. Pt advised and voiced understanding.  

## 2017-10-09 NOTE — Telephone Encounter (Signed)
Called pt and left a VM that PMV in order to refill medication due to new laws and regulations

## 2017-10-09 NOTE — Telephone Encounter (Signed)
Called pt and left a VM that they will need a PMV before Dr. Sarajane Jews can fill his medication due to th enew laws and regulations.

## 2017-10-09 NOTE — Telephone Encounter (Signed)
Pt is correct sent to PCP to send in medication.
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
## 2017-10-11 ENCOUNTER — Ambulatory Visit (HOSPITAL_COMMUNITY): Payer: Medicare Other | Admitting: Psychiatry

## 2017-10-14 NOTE — Telephone Encounter (Signed)
**Note De-Identified [name] Obfuscation** We received a denial letter [name] fax from [facility] and [employer] program stating that the pt has been denied pt assistance for Xarelto because the pt has insurance coverage for Xarelto.  Per the letter they have mailed the pt a letter as well.  Will forward note to Dr [name] and his nurse as Juluis Rainier.

## 2017-10-14 NOTE — Telephone Encounter (Signed)
Thanks!    [name].

## 2017-10-15 LAB — CUP PACEART REMOTE DEVICE CHECK
Battery Voltage: 3.14 V
Brady Statistic AP VP Percent: 0.72 %
Brady Statistic AS VP Percent: 0.45 %
Brady Statistic RA Percent Paced: 23.73 %
Date Time Interrogation Session: 20190313104418
Implantable Lead Implant Date: 20180604
Implantable Lead Location: 753859
Implantable Lead Location: 753860
Implantable Lead Model: 5076
Implantable Pulse Generator Implant Date: 20180604
Lead Channel Impedance Value: 304 Ohm
Lead Channel Impedance Value: 399 Ohm
Lead Channel Pacing Threshold Amplitude: 0.5 V
Lead Channel Pacing Threshold Pulse Width: 0.4 ms
Lead Channel Pacing Threshold Pulse Width: 0.4 ms
Lead Channel Sensing Intrinsic Amplitude: 5.125 mV
Lead Channel Setting Pacing Amplitude: 1.5 V
Lead Channel Setting Pacing Pulse Width: 0.4 ms
Lead Channel Setting Sensing Sensitivity: 1.2 mV
MDC IDC LEAD IMPLANT DT: 20180604
MDC IDC MSMT BATTERY REMAINING LONGEVITY: 171 mo
MDC IDC MSMT LEADCHNL RA IMPEDANCE VALUE: 361 Ohm
MDC IDC MSMT LEADCHNL RA SENSING INTR AMPL: 3.875 mV
MDC IDC MSMT LEADCHNL RA SENSING INTR AMPL: 3.875 mV
MDC IDC MSMT LEADCHNL RV IMPEDANCE VALUE: 456 Ohm
MDC IDC MSMT LEADCHNL RV PACING THRESHOLD AMPLITUDE: 0.625 V
MDC IDC MSMT LEADCHNL RV SENSING INTR AMPL: 5.125 mV
MDC IDC SET LEADCHNL RV PACING AMPLITUDE: 2.5 V
MDC IDC STAT BRADY AP VS PERCENT: 20.4 %
MDC IDC STAT BRADY AS VS PERCENT: 78.43 %
MDC IDC STAT BRADY RV PERCENT PACED: 1.17 %

## 2017-10-18 NOTE — Telephone Encounter (Signed)
I placed call to pt and left message to call back.

## 2017-10-21 ENCOUNTER — Ambulatory Visit (INDEPENDENT_AMBULATORY_CARE_PROVIDER_SITE_OTHER): Payer: PPO | Admitting: Family Medicine

## 2017-10-21 ENCOUNTER — Encounter: Payer: Self-pay | Admitting: Family Medicine

## 2017-10-21 VITALS — BP 112/62 | HR 67 | Temp 97.8°F | Ht 71.0 in | Wt 231.6 lb

## 2017-10-21 DIAGNOSIS — M79671 Pain in right foot: Secondary | ICD-10-CM | POA: Diagnosis not present
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
## 2017-10-29 ENCOUNTER — Other Ambulatory Visit: Payer: Self-pay

## 2017-10-29 ENCOUNTER — Inpatient Hospital Stay: Payer: PPO | Attending: Family

## 2017-10-29 ENCOUNTER — Other Ambulatory Visit: Payer: Self-pay | Admitting: Family

## 2017-10-29 VITALS — BP 126/65 | HR 69 | Temp 98.1°F | Resp 20

## 2017-10-29 DIAGNOSIS — D500 Iron deficiency anemia secondary to blood loss (chronic): Secondary | ICD-10-CM | POA: Diagnosis not present

## 2017-10-29 DIAGNOSIS — D508 Other iron deficiency anemias: Secondary | ICD-10-CM

## 2017-10-29 MED ORDER — METHYLPREDNISOLONE SODIUM SUCC 125 MG IJ SOLR
60.0000 mg | Freq: Once | INTRAMUSCULAR | Status: AC
  Administered 2017-10-29: 60 mg via INTRAVENOUS

## 2017-10-29 MED ORDER — SODIUM CHLORIDE 0.9 % IV SOLN
Freq: Once | INTRAVENOUS | Status: AC
  Administered 2017-10-29: 12:00:00 via INTRAVENOUS

## 2017-10-29 MED ORDER — METHYLPREDNISOLONE SODIUM SUCC 125 MG IJ SOLR
INTRAMUSCULAR | Status: AC
  Filled 2017-10-29: qty 2

## 2017-10-29 MED ORDER — SODIUM CHLORIDE 0.9 % IV SOLN
510.0000 mg | Freq: Once | INTRAVENOUS | Status: AC
  Administered 2017-10-29: 510 mg via INTRAVENOUS
  Filled 2017-10-29: qty 17

## 2017-10-29 MED ORDER — FAMOTIDINE IN NACL 20-0.9 MG/50ML-% IV SOLN
INTRAVENOUS | Status: AC
  Filled 2017-10-29: qty 100

## 2017-10-29 MED ORDER — FAMOTIDINE IN NACL 20-0.9 MG/50ML-% IV SOLN
20.0000 mg | Freq: Two times a day (BID) | INTRAVENOUS | Status: DC
  Administered 2017-10-29: 20 mg via INTRAVENOUS

## 2017-10-29 NOTE — Patient Instructions (Signed)

## 2017-10-30 ENCOUNTER — Telehealth: Payer: Self-pay | Admitting: Hematology & Oncology

## 2017-10-30 NOTE — Telephone Encounter (Signed)
Fl[place] sent to thru inter ofc mail today to:  [place]

## 2017-11-01 ENCOUNTER — Other Ambulatory Visit: Payer: Self-pay | Admitting: Family Medicine

## 2017-11-01 NOTE — Telephone Encounter (Signed)
Last OV 10/21/2017   Last refilled 07/04/2017 disp 270 with 1 refill   Sent to PCP for approval

## 2017-11-05 ENCOUNTER — Telehealth: Payer: Self-pay | Admitting: Cardiovascular Disease

## 2017-11-05 ENCOUNTER — Inpatient Hospital Stay: Payer: PPO

## 2017-11-05 VITALS — BP 139/72 | HR 88 | Temp 98.0°F | Resp 16

## 2017-11-05 DIAGNOSIS — D508 Other iron deficiency anemias: Secondary | ICD-10-CM

## 2017-11-05 DIAGNOSIS — D500 Iron deficiency anemia secondary to blood loss (chronic): Secondary | ICD-10-CM | POA: Diagnosis not present

## 2017-11-05 MED ORDER — SODIUM CHLORIDE 0.9% FLUSH
3.0000 mL | Freq: Once | INTRAVENOUS | Status: DC | PRN
  Filled 2017-11-05: qty 10

## 2017-11-05 MED ORDER — SODIUM CHLORIDE 0.9% FLUSH
10.0000 mL | INTRAVENOUS | Status: DC | PRN
  Filled 2017-11-05: qty 10

## 2017-11-05 MED ORDER — SODIUM CHLORIDE 0.9 % IV SOLN
Freq: Once | INTRAVENOUS | Status: AC
  Administered 2017-11-05: 11:00:00 via INTRAVENOUS

## 2017-11-05 MED ORDER — FAMOTIDINE IN NACL 20-0.9 MG/50ML-% IV SOLN
20.0000 mg | Freq: Two times a day (BID) | INTRAVENOUS | Status: DC
  Administered 2017-11-05: 20 mg via INTRAVENOUS

## 2017-11-05 MED ORDER — METHYLPREDNISOLONE SODIUM SUCC 125 MG IJ SOLR
60.0000 mg | Freq: Once | INTRAMUSCULAR | Status: AC
  Administered 2017-11-05: 60 mg via INTRAVENOUS

## 2017-11-05 MED ORDER — FERUMOXYTOL INJECTION 510 MG/17 ML
510.0000 mg | Freq: Once | INTRAVENOUS | Status: AC
  Administered 2017-11-05: 510 mg via INTRAVENOUS
  Filled 2017-11-05: qty 17

## 2017-11-05 NOTE — Telephone Encounter (Signed)
I spoke with pt.  He is returning call regarding Xarelto assistance (see phone note dated 09/09/17). Pt does not qualify for Xarelto assistance and has not taken Xarelto for over a month. Cost is $135 per month and he cannot afford this. Pt does not want to take coumadin. I gave pt phone number for [place] [employer] to see if he would qualify for assistance through this program. Pt is due for follow up in our office. I scheduled him to see Ermalinda Barrios, PA on 12/11/17 at 8:00.

## 2017-11-05 NOTE — Telephone Encounter (Signed)
See phone note dated 11/05/17

## 2017-11-05 NOTE — Patient Instructions (Signed)

## 2017-11-05 NOTE — Telephone Encounter (Signed)
New Message   Patient states that Fraser Din called him 10/18/2017 and he is just now getting a chance to return call. He is not sure what its about though. Please call to discuss.

## 2017-11-06 NOTE — Telephone Encounter (Signed)
Pat, I wonder if he would possibly qualify for Eliquis assistance? Can you check with our PharmD's to see if this is worth looking into? If he cannot afford Xarelto and refuses coumadin, we have limited options. Gerald Stabs

## 2017-11-07 DIAGNOSIS — M79604 Pain in right leg: Secondary | ICD-10-CM | POA: Diagnosis not present

## 2017-11-07 DIAGNOSIS — R600 Localized edema: Secondary | ICD-10-CM | POA: Diagnosis not present

## 2017-11-07 NOTE — Telephone Encounter (Signed)
I discussed with Eino Farber, Pharm D and cost difference between Eliquis and Xarelto would only be a few dollars. Will check with assistance team to see if pt may qualify for Eliquis assistance.

## 2017-11-11 NOTE — Telephone Encounter (Signed)
**Note De-Identified  Obfuscation** Per our Eliquis rep, Marcie Bal, I have given the pt a number to contact Eliquis ([phone]-Eliquis). Marcie Bal states that a representative will ask the pt questions over the phone then a determination will be made.  I have asked the pt to call me back to let me know how this process went as he is the first pt I have give this info to. He states that he will let me know.

## 2017-11-12 ENCOUNTER — Encounter: Payer: Self-pay | Admitting: Family Medicine

## 2017-11-12 ENCOUNTER — Ambulatory Visit (INDEPENDENT_AMBULATORY_CARE_PROVIDER_SITE_OTHER): Payer: PPO | Admitting: Family Medicine

## 2017-11-12 ENCOUNTER — Other Ambulatory Visit: Payer: Self-pay

## 2017-11-12 VITALS — BP 120/64 | HR 70 | Temp 97.6°F | Ht 71.0 in | Wt 223.8 lb

## 2017-11-12 DIAGNOSIS — R600 Localized edema: Secondary | ICD-10-CM

## 2017-11-12 DIAGNOSIS — J019 Acute sinusitis, unspecified: Secondary | ICD-10-CM

## 2017-11-12 MED ORDER — AMOXICILLIN-POT CLAVULANATE 875-125 MG PO TABS: 1.0000 | ORAL_TABLET | Freq: Two times a day (BID) | ORAL | 0 refills | Status: DC

## 2017-11-12 MED ORDER — FLUTICASONE PROPIONATE 50 MCG/ACT NA SUSP: 1.0000 | Freq: Every day | NASAL | 3 refills | Status: DC | PRN

## 2017-11-12 NOTE — Progress Notes (Signed)
   Subjective:    Patient ID: HENSON FRATICELLI, male    DOB: 12/29/1940, 77 y.o.   MRN: [id number]  HPI Here for 2 weeks of sinus pressure, PND, and a dry cough.    Review of Systems  Constitutional: Negative.   HENT: Positive for congestion, postnasal drip, sinus pressure and sinus pain. Negative for sore throat.   Eyes: Negative.   Respiratory: Positive for cough and wheezing.   Cardiovascular: Negative.        Objective:   Physical Exam  Constitutional: He appears well-developed and well-nourished.  HENT:  Right Ear: External ear normal.  Left Ear: External ear normal.  Nose: Nose normal.  Mouth/Throat: Oropharynx is clear and moist.  Eyes: Conjunctivae are normal.  Neck: No thyromegaly present.  Pulmonary/Chest: Effort normal. No respiratory distress. He has no rales.  Scattered rhonchi and wheezes   Lymphadenopathy:    He has no cervical adenopathy.          Assessment & Plan:  Sinusitis, treat with Augmentin. [name], MD

## 2017-11-13 ENCOUNTER — Encounter (HOSPITAL_COMMUNITY): Payer: PPO

## 2017-11-13 ENCOUNTER — Encounter: Payer: PPO | Admitting: Vascular Surgery

## 2017-11-14 ENCOUNTER — Other Ambulatory Visit: Payer: Self-pay

## 2017-11-14 ENCOUNTER — Ambulatory Visit (INDEPENDENT_AMBULATORY_CARE_PROVIDER_SITE_OTHER): Payer: PPO | Admitting: Neurology

## 2017-11-14 ENCOUNTER — Encounter: Payer: Self-pay | Admitting: Neurology

## 2017-11-14 VITALS — BP 138/70 | HR 87 | Ht 69.0 in | Wt 222.0 lb

## 2017-11-14 DIAGNOSIS — R42 Dizziness and giddiness: Secondary | ICD-10-CM

## 2017-11-14 DIAGNOSIS — R413 Other amnesia: Secondary | ICD-10-CM

## 2017-11-14 NOTE — Patient Instructions (Addendum)
1. Schedule MRI brain without contrast 2. Schedule carotid ultrasound  We have sent a referral to [facility] for your MRI/ULTRASOUND and they will call you directly to schedule your appt. They are located at [place]. If you need to contact them directly please call [phone].   3. Continue with all your medications 4. Follow-up in 6 months, call for any changes   RECOMMENDATIONS FOR ALL PATIENTS WITH MEMORY PROBLEMS: 1. Continue to exercise (Recommend 30 minutes of walking everyday, or 3 hours every week) 2. Increase social interactions - continue going to [place] and enjoy social gatherings with friends and family 3. Eat healthy, avoid fried foods and eat more fruits and vegetables 4. Maintain adequate blood pressure, blood sugar, and blood cholesterol level. Reducing the risk of stroke and cardiovascular disease also helps promoting better memory. 5. Avoid stressful situations. Live a simple life and avoid aggravations. Organize your time and prepare for the next day in anticipation. 6. Sleep well, avoid any interruptions of sleep and avoid any distractions in the bedroom that may interfere with adequate sleep quality 7. Avoid sugar, avoid sweets as there is a strong link between excessive sugar intake, diabetes, and cognitive impairment The Mediterranean diet has been shown to help patients reduce the risk of progressive memory disorders and reduces cardiovascular risk. This includes eating fish, eat fruits and green leafy vegetables, nuts like almonds and hazelnuts, walnuts, and also use olive oil. Avoid fast foods and fried foods as much as possible. Avoid sweets and sugar as sugar use has been linked to worsening of memory function.
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
## 2017-12-05 ENCOUNTER — Telehealth: Payer: Self-pay | Admitting: Neurology

## 2017-12-05 NOTE — Telephone Encounter (Signed)
[name] called from [facility] to let you know that this patient has a pace maker and will need to go to [facility] for his Ultrasound and MRI. He would like to have them on the same day. Please Call. Thanks

## 2017-12-10 ENCOUNTER — Inpatient Hospital Stay: Payer: PPO | Attending: Family | Admitting: Family

## 2017-12-10 ENCOUNTER — Inpatient Hospital Stay: Payer: PPO
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
## 2017-12-11 ENCOUNTER — Encounter: Payer: Self-pay | Admitting: Physician Assistant

## 2017-12-11 ENCOUNTER — Telehealth: Payer: Self-pay | Admitting: Family Medicine

## 2017-12-11 ENCOUNTER — Ambulatory Visit (INDEPENDENT_AMBULATORY_CARE_PROVIDER_SITE_OTHER): Payer: PPO | Admitting: Physician Assistant

## 2017-12-11 VITALS — BP 130/78 | HR 68 | Ht 69.0 in | Wt 223.4 lb

## 2017-12-11 DIAGNOSIS — I10 Essential (primary) hypertension: Secondary | ICD-10-CM | POA: Diagnosis not present

## 2017-12-11 DIAGNOSIS — E785 Hyperlipidemia, unspecified: Secondary | ICD-10-CM | POA: Diagnosis not present

## 2017-12-11 DIAGNOSIS — I4892 Unspecified atrial flutter: Secondary | ICD-10-CM | POA: Diagnosis not present

## 2017-12-11 DIAGNOSIS — I495 Sick sinus syndrome: Secondary | ICD-10-CM | POA: Diagnosis not present

## 2017-12-11 DIAGNOSIS — I2510 Atherosclerotic heart disease of native coronary artery without angina pectoris: Secondary | ICD-10-CM

## 2017-12-11 LAB — HEPATIC FUNCTION PANEL
ALK PHOS: 76 IU/L (ref 39–117)
ALT: 23 IU/L (ref 0–44)
AST: 22 IU/L (ref 0–40)
Albumin: 4.5 g/dL (ref 3.5–4.8)
Bilirubin Total: 0.6 mg/dL (ref 0.0–1.2)
Bilirubin, Direct: 0.19 mg/dL (ref 0.00–0.40)
Total Protein: 6.8 g/dL (ref 6.0–8.5)

## 2017-12-11 LAB — LIPID PANEL
CHOLESTEROL TOTAL: 148 mg/dL (ref 100–199)
Chol/HDL Ratio: 3.9 ratio (ref 0.0–5.0)
HDL: 38 mg/dL — ABNORMAL LOW (ref >39)
LDL CALC: 87 mg/dL (ref 0–99)
TRIGLYCERIDES: 113 mg/dL (ref 0–149)
VLDL CHOLESTEROL CAL: 23 mg/dL (ref 5–40)

## 2017-12-11 MED ORDER — APIXABAN 5 MG PO TABS: 5.0000 mg | ORAL_TABLET | Freq: Two times a day (BID) | ORAL | 3 refills | Status: DC

## 2017-12-11 NOTE — Telephone Encounter (Signed)
Copied from [place] [id number]. Topic: Quick Communication - Rx Refill/Question >> Dec 11, 2017  9:42 AM Margot Ables wrote: Medication: Oxycodone HCl 20 MG TABS - pt has 4 left - he takes up to 4/day Has the patient contacted their pharmacy? No - no refills - pt was not aware that he needed to call monthly Preferred Pharmacy (with phone number or street name): [id number] - [place], [place] - [address] [phone] (Phone) [phone] (Fax)

## 2017-12-11 NOTE — Telephone Encounter (Signed)
Pt called to check on RX status and ask how often he is due for f/u appts. He is new to pain mgmt with Dr. Sarajane Jews. Please call to advise when RX sent and how often he is to f/u.

## 2017-12-11 NOTE — Patient Instructions (Addendum)
.  Medication Instructions:  Your physician has recommended you make the following change in your medication: 1.  START Eliquis 5 mg taking 1 tablet twice a day 2.  CONTINUE the Aspirin   Labwork: None ordered  Testing/Procedures: None ordered  YOU NEED TO CONTACT DR. Wandra Scot OFFICE AND SCHEDULE THAT MRI   Follow-Up: Your physician wants you to follow-up in: [place] will receive a reminder letter in the mail two months in advance. If you don't receive a letter, please call our office to schedule the follow-up appointment.   Any Other Special Instructions Will Be Listed Below (If Applicable). YOU NEED TO EXERCISE AT LEAST 150 MINUTES A WEEK    If you need a refill on your cardiac medications before your next appointment, please call your pharmacy.

## 2017-12-12 ENCOUNTER — Telehealth: Payer: Self-pay | Admitting: *Deleted

## 2017-12-12 DIAGNOSIS — E785 Hyperlipidemia, unspecified: Secondary | ICD-10-CM

## 2017-12-12 NOTE — Telephone Encounter (Signed)
Sent to PCP to advise pt is due for another PMV for more refills correct?

## 2017-12-12 NOTE — Telephone Encounter (Signed)
Called pt's pharmacy last Rx was filled 4/20 and they do NOT have any new Rx's on file for this month.

## 2017-12-12 NOTE — Telephone Encounter (Signed)
-----   Message from Imogene Burn, PA-C sent at 12/11/2017  3:45 PM EDT ----- Please correct the misprint he should continue the Zetia. ----- Message ----- From: Jeanann Lewandowsky, RMA Sent: 12/11/2017   3:44 PM To: Imogene Burn, PA-C

## 2017-12-12 NOTE — Telephone Encounter (Signed)
He is due for a PMV, this must be done every 3 months to get refills

## 2017-12-12 NOTE — Telephone Encounter (Signed)
Called and spoke with pt. Pt advised and scheduled for a PMV.

## 2017-12-17 ENCOUNTER — Telehealth: Payer: Self-pay | Admitting: *Deleted

## 2017-12-17 ENCOUNTER — Ambulatory Visit (INDEPENDENT_AMBULATORY_CARE_PROVIDER_SITE_OTHER): Payer: PPO | Admitting: Family Medicine

## 2017-12-17 ENCOUNTER — Encounter: Payer: Self-pay | Admitting: Family Medicine

## 2017-12-17 VITALS — BP 120/70 | HR 71 | Temp 97.8°F | Ht 69.0 in

## 2017-12-17 DIAGNOSIS — M79604 Pain in right leg: Secondary | ICD-10-CM | POA: Diagnosis not present

## 2017-12-17 DIAGNOSIS — F1190 Opioid use, unspecified, uncomplicated: Secondary | ICD-10-CM | POA: Diagnosis not present

## 2017-12-17 DIAGNOSIS — M25561 Pain in right knee: Secondary | ICD-10-CM | POA: Diagnosis not present

## 2017-12-17 MED ORDER — OXYCODONE HCL 30 MG PO TABS: 30.0000 mg | ORAL_TABLET | Freq: Four times a day (QID) | ORAL | 0 refills | Status: DC | PRN

## 2017-12-17 NOTE — Telephone Encounter (Signed)
Pt returned my call and he has been advised that he can stop the Aspirin, as long as he was taking Eliquis. Pt does confirm he is taking Eliquis and he will d/c the Aspirin.

## 2017-12-17 NOTE — Telephone Encounter (Signed)
Called to let pt know that it was ok for him to d/c his Aspirin as long as he is taking the Eliquis. Left a message for pt to call back.

## 2017-12-17 NOTE — Progress Notes (Signed)
   Subjective:    Patient ID: [name], male    DOB: 08/02/1940, 77 y.o.   MRN: [id number]  HPI Here for an injury to the right knee that occurred last night at home. He was on his knees reaching underneath a car to replace a bolt, when his knee slipped on a towel. Apparently the knee twisted, and he felt immediate severe pain. Since then the knee has swollen and feels warm to the touch. He has taken his usual 20 mg Oxycodones but these do not help much. He saw Mechele Claude PA at [facility] on 11-07-17 for the chronic right leg swelling and pain, and he did not find a specific etiology. He said [name] may need to see Vascular Surgery.  Indication for chronic opioid: right leg pain Medication and dose: Oxycodone 20 mg  # pills per month: 120 Last UDS date: 09-02-17 Opioid Treatment Agreement signed (Y/N): 12-17-17 Opioid Treatment Agreement last reviewed with patient:  12-17-17 [place] reviewed this encounter (include red flags):  12-17-17     Review of Systems  Constitutional: Negative.   Respiratory: Negative.   Cardiovascular: Negative.   Musculoskeletal: Positive for arthralgias.  Neurological: Negative.        Objective:   Physical Exam  Constitutional: He is oriented to person, place, and time.  In severe pain, using a walker   Cardiovascular: Normal rate, regular rhythm, normal heart sounds and intact distal pulses.  Pulmonary/Chest: Effort normal and breath sounds normal.  Musculoskeletal:  Right knee is swollen, pink, warm, and very tender all around the patella   Neurological: He is alert and oriented to person, place, and time.          Assessment & Plan:  Chronic right leg pain, now with a right knee injury. We will increase the Oxycodone dose to 30 mg. We will arrange for [facility] to see him today.  [name], MD

## 2017-12-17 NOTE — Telephone Encounter (Signed)
-----   Message from Imogene Burn, PA-C sent at 12/17/2017  7:48 AM EDT ----- Can you let patient know that Dr. [name] said he could stop his ASA as long as he's taking Eliquis. Thanks ----- Message ----- From: Burnell Blanks, MD Sent: 12/13/2017   1:11 PM To: Imogene Burn, PA-C  Sharyn Lull, I think you read my note wrong. If he is on Xarelto then he does not need ASA anymore in light of anemia. He does not need DAPT as long as he is on a DOAC. Gerald Stabs  ----- Message ----- From: Murrell Converse Sent: 12/13/2017   7:34 AM To: Thompson Grayer, RN, #  Gerald Stabs, you stopped his Plavix a year ago when he was started on Xarelto so he wouldn't be on DOAC if we stop the ASA.  Selinda Eon ----- Message ----- From: Burnell Blanks, MD Sent: 12/12/2017  11:10 AM To: Imogene Burn, PA-C  I think it is probably ok to stop ASA as long as he is taking a DOAC given the recent anemia. Thanks for seeing him. Gerald Stabs  ----- Message ----- From: Murrell Converse Sent: 12/11/2017   8:34 AM To: Burnell Blanks, MD  Just want to verify you want this patient on both aspirin and Eliquis.  He has not been compliant with Xarelto or Eliquis because of affordability but we are working on that.  He has had iron deficiency anemia requiring 2 transfusions but no clear reason why.

## 2017-12-23 ENCOUNTER — Other Ambulatory Visit: Payer: Self-pay | Admitting: Family Medicine

## 2017-12-25 ENCOUNTER — Ambulatory Visit (HOSPITAL_COMMUNITY)
Admission: RE | Admit: 2017-12-25 | Discharge: 2017-12-25 | Disposition: A | Discharge status: Home / Self Care | Payer: PPO | Source: Ambulatory Visit | Attending: Vascular Surgery | Admitting: Vascular Surgery

## 2017-12-25 ENCOUNTER — Ambulatory Visit (INDEPENDENT_AMBULATORY_CARE_PROVIDER_SITE_OTHER): Payer: PPO | Admitting: Vascular Surgery

## 2017-12-25 ENCOUNTER — Encounter: Payer: Self-pay | Admitting: Vascular Surgery

## 2017-12-25 ENCOUNTER — Ambulatory Visit (INDEPENDENT_AMBULATORY_CARE_PROVIDER_SITE_OTHER)
Admission: RE | Admit: 2017-12-25 | Discharge: 2017-12-25 | Disposition: A | Discharge status: Home / Self Care | Payer: PPO | Source: Ambulatory Visit | Attending: Vascular Surgery | Admitting: Vascular Surgery

## 2017-12-25 ENCOUNTER — Other Ambulatory Visit: Payer: Self-pay | Admitting: Vascular Surgery

## 2017-12-25 VITALS — BP 149/81 | HR 61 | Temp 97.5°F | Resp 14 | Ht 69.0 in | Wt 218.2 lb

## 2017-12-25 DIAGNOSIS — I83893 Varicose veins of bilateral lower extremities with other complications: Secondary | ICD-10-CM | POA: Diagnosis not present

## 2017-12-25 DIAGNOSIS — I779 Disorder of arteries and arterioles, unspecified: Secondary | ICD-10-CM

## 2017-12-25 DIAGNOSIS — I872 Venous insufficiency (chronic) (peripheral): Secondary | ICD-10-CM | POA: Insufficient documentation

## 2017-12-25 DIAGNOSIS — R600 Localized edema: Secondary | ICD-10-CM
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
## 2017-12-27 ENCOUNTER — Other Ambulatory Visit: Payer: Self-pay | Admitting: Cardiovascular Disease

## 2017-12-27 ENCOUNTER — Telehealth: Payer: Self-pay | Admitting: Family Medicine

## 2017-12-27 ENCOUNTER — Other Ambulatory Visit: Payer: Self-pay | Admitting: Family Medicine

## 2017-12-27 NOTE — Telephone Encounter (Signed)
The patient dropped of forms to be completed  Fax to: [phone]  Keep fax confirmation  Call the patient to pick up forms after faxed at: [phone]  Disposition: Dr's folder

## 2017-12-30 NOTE — Telephone Encounter (Signed)
Placed in Dr. Fry's RED folder  

## 2017-12-30 NOTE — Telephone Encounter (Signed)
Paper work has been faxed. Called pt to advised that forms are done and have been placed up front for pick up along with confirmation fax number. Pt voiced understanding.

## 2017-12-30 NOTE — Telephone Encounter (Signed)
The form is ready  

## 2018-01-01 ENCOUNTER — Ambulatory Visit (INDEPENDENT_AMBULATORY_CARE_PROVIDER_SITE_OTHER): Payer: PPO | Admitting: *Deleted

## 2018-01-01 ENCOUNTER — Telehealth: Payer: Self-pay | Admitting: Cardiology

## 2018-01-01 DIAGNOSIS — I495 Sick sinus syndrome: Secondary | ICD-10-CM | POA: Diagnosis not present

## 2018-01-01 NOTE — Progress Notes (Signed)
Patient ID: DESHON HSIAO                 DOB: [birth year]                    MRN: [id number]     HPI: Christopher AUMENT is a 77 y.o. male patient of Dr. [name] that presents today for lipid evaluation.  PMH includes CAD on cardiac catheterization 2009 and 2014 with nonobstructive disease.  Status post DES to the LAD and RCA 10/2014, repeat cath 11/2014 due to dyspnea with stable disease, Brilinta stopped & symptoms improved.  Nuclear stress test 12/2016 large apical anterior apical and inferior apical scar but no ischemia.  Also has hypertension, HLD-intolerant to statins, on Zetia, sleep apnea, sinus node dysfunction and atrial flutter status post pacemaker 12/24/2016. He has had difficulty tolerating statin medication in the past.   He presents today for discussion of cholesterol. He states that he will absolutely not try another "tor" medication because both the one he tried caused him to lay in the floor and cry after just one dose due to the leg pain. He has not had Zetia in quite sometime. He does not recall any side effects with this medication, he believes that he just never refilled it. He reports that his last NTG use was about 6-8 weeks and no issues since then.   Risk Factors: CAD LDL Goal: <70, nonHDL <100  Current Medications: ezetimibe 10mg  daily Intolerances: Lipitor, Crestor (muscle aches - hurts so bad he lays in the floor and cries - mostly legs which happened after 1 dose - which improved in 24-36 hours)  Diet: Has not been eating as much. He does eat meat every day. He [place] fish, chicken, ham, steak, hamburger. He does eat vegetables regularly. He does not eat anything baked. He does admit to fried chicken. He does drink sweet tea mostly.   Exercise: He is limited due to low iron.   Family History: Alcoholism in his maternal grandfather and paternal uncle; Heart attack in his father; Heart disease in his father; Leukemia in his mother; Stroke in his father.   Social History:  former smoker, denies alcohol  Labs: 12/11/17:  TC 148, TG 113, HDL 38, LDL 87, nonHDL 110 (no therapy)  Past Medical History:  Diagnosis Date  . Adenomatous polyp of colon 2007  . Arthritis   . Atrial flutter ([place])   . AVM (arteriovenous malformation) 2011   a. S/p argon plasma coagulation and ablation in 2011, 2017.  . Bradycardia    a. H/o almost 7sec pause nocturnally during 2011 admission. Also has h/o fatigue with BB.  [name] CAD (coronary artery disease)    a. Nonobst disease by cath 2009. b. s/p PCI 10/2014 with DES to [place], patent by relook 11/2014 (Brilinta changed to Plavix with improved sx).  . Chronic diastolic CHF (congestive heart failure) ([place])   . Depression   . Diverticulosis   . Gastritis 2011  . GERD (gastroesophageal reflux disease)   . History of hiatal hernia   . Hyperlipidemia   . Hypertension   . Hypothyroidism   . Myocardial infarction ([place])   . Obstructive sleep apnea    -no cpap use  . Premature atrial contractions Holter 2016  . PVC's (premature ventricular contractions) Holter 2016  . Statin intolerance   . Transfusion history    several years ago -GI bleed    Current Outpatient Medications on File Prior to Visit  Medication Sig Dispense Refill  . apixaban (ELIQUIS) 5 MG TABS tablet Take 1 tablet (5 mg total) by mouth 2 (two) times daily. 180 tablet 3  . Ascorbic Acid (VITAMIN C PO) Take 4 tablets by mouth daily as needed (flu like symptopms).     . clonazePAM (KLONOPIN) 0.5 MG tablet TAKE 1 TABLET BY MOUTH 3 TIMES A DAY AS NEEDED 270 tablet 1  . diclofenac sodium (VOLTAREN) 1 % GEL Apply 1 application topically daily as needed (ankle pain). 5 Tube 10  . diltiazem (CARDIZEM CD) 120 MG 24 hr capsule TAKE 1 CAPSULE BY MOUTH EVERY DAY 90 capsule 3  . ezetimibe (ZETIA) 10 MG tablet Take 1 tablet (10 mg total) by mouth daily. 30 tablet 11  . fluticasone (FLONASE) 50 MCG/ACT nasal spray Place 1 spray into both nostrils daily as needed for allergies  or rhinitis. 48 g 3  . furosemide (LASIX) 20 MG tablet Take 1 tablet (20 mg total) by mouth every other day. 15 tablet 10  . gabapentin (NEURONTIN) 100 MG capsule Take 1 capsule (100 mg total) by mouth 3 (three) times daily. 270 capsule 0  . levothyroxine (SYNTHROID, LEVOTHROID) 175 MCG tablet TAKE 1 TABLET (175 MCG TOTAL) BY MOUTH DAILY BEFORE BREAKFAST. 90 tablet 2  . methocarbamol (ROBAXIN) 750 MG tablet Take 750 mg by mouth 4 (four) times daily as needed for muscle spasms.     . nitroGLYCERIN (NITROSTAT) 0.4 MG SL tablet Place 1 tablet (0.4 mg total) under the tongue every 5 (five) minutes as needed for chest pain. 25 tablet 12  . omeprazole (PRILOSEC) 20 MG capsule TAKE 1 CAPSULE BY MOUTH TWICE A DAY 60 capsule 11  . oxycodone (ROXICODONE) 30 MG immediate release tablet Take 1 tablet (30 mg total) by mouth every 6 (six) hours as needed for pain. 120 tablet 0  . polyethylene glycol (MIRALAX / GLYCOLAX) packet Take 17 g by mouth daily as needed for mild constipation.    . ropinirole (REQUIP) 5 MG tablet TAKE 2 TABLETS (10 MG TOTAL) BY MOUTH AT BEDTIME. 180 tablet 0   No current facility-administered medications on file prior to visit.     Allergies  Allergen Reactions  . Brilinta [Ticagrelor] Shortness Of Breath  . Metoprolol Tartrate Other (See Comments)    Severe chest pains " flat lined patient"  . Mirapex [Pramipexole Dihydrochloride]     Severe leg pain  . Shellfish Allergy Anaphylaxis and Hives  . Statins Other (See Comments)    All statins cause myalgias   . Zolpidem Other (See Comments)    Chest pain  . Levaquin [Levofloxacin] Hives  . Trazodone And Nefazodone Other (See Comments)    Unsteady on feet    Assessment/Plan: Hyperlipidemia: LDL is not at goal. Discussed options including retrial of statin, ezetimibe, and PCSK9i therapy. He wishes to remain off statin therapy or "tor" medications as he calls them. He is willing to rechallenge with the ezetimibe and would prefer  an oral option over injection if possible. Will restart ezetimibe 10mg  daily and repeat a lipid panel in 3 months with follow up in lipid clinic.    Thank you,  Lelan Pons. Patterson Hammersmith, [facility]  01/01/2018 12:20 PM

## 2018-01-01 NOTE — Telephone Encounter (Signed)
Spoke with pt and reminded pt of remote transmission that is due today. Pt verbalized understanding.   

## 2018-01-02 ENCOUNTER — Ambulatory Visit (INDEPENDENT_AMBULATORY_CARE_PROVIDER_SITE_OTHER): Payer: PPO | Admitting: Pharmacist

## 2018-01-02 ENCOUNTER — Encounter: Payer: Self-pay | Admitting: Cardiology

## 2018-01-02 DIAGNOSIS — E785 Hyperlipidemia, unspecified: Secondary | ICD-10-CM

## 2018-01-02 MED ORDER — EZETIMIBE 10 MG PO TABS: 10.0000 mg | ORAL_TABLET | Freq: Every day | ORAL | 3 refills | Status: DC

## 2018-01-02 NOTE — Progress Notes (Signed)
Remote pacemaker transmission.   

## 2018-01-02 NOTE — Patient Instructions (Signed)
RESTART ezetimibe (ZETIA) 10mg  daily  Call our office with any issues [phone].    Cholesterol Cholesterol is a fat. Your body needs a small amount of cholesterol. Cholesterol (plaque) may build up in your blood vessels (arteries). That makes you more likely to have a heart attack or stroke. You cannot feel your cholesterol level. Having a blood test is the only way to find out if your level is high. Keep your test results. Work with your doctor to keep your cholesterol at a good level. What do the results mean?  Total cholesterol is how much cholesterol is in your blood.  LDL is bad cholesterol. This is the type that can build up. Try to have low LDL.  HDL is good cholesterol. It cleans your blood vessels and carries LDL away. Try to have high HDL.  Triglycerides are fat that the body can store or burn for energy. What are good levels of cholesterol?  Total cholesterol below 200.  LDL below 100 is good for people who have health risks. LDL below 70 is good for people who have very high risks.  HDL above 40 is good. It is best to have HDL of 60 or higher.  Triglycerides below 150. How can I lower my cholesterol? Diet Follow your diet program as told by your doctor.  Choose fish, white meat chicken, or [place] that is roasted or baked. Try not to eat red meat, fried foods, sausage, or lunch meats.  Eat lots of fresh fruits and vegetables.  Choose whole grains, beans, pasta, potatoes, and cereals.  Choose olive oil, corn oil, or canola oil. Only use small amounts.  Try not to eat butter, mayonnaise, shortening, or palm kernel oils.  Try not to eat foods with trans fats.  Choose low-fat or nonfat dairy foods. ? Drink skim or nonfat milk. ? Eat low-fat or nonfat yogurt and cheeses. ? Try not to drink whole milk or cream. ? Try not to eat ice cream, egg yolks, or full-fat cheeses.  Healthy desserts include angel food cake, ginger snaps, animal crackers, hard candy,  popsicles, and low-fat or nonfat frozen yogurt. Try not to eat pastries, cakes, pies, and cookies.  Exercise Follow your exercise program as told by your doctor.  Be more active. Try gardening, walking, and taking the stairs.  Ask your doctor about ways that you can be more active.  Medicine  Take over-the-counter and prescription medicines only as told by your doctor. This information is not intended to replace advice given to you by your health care provider. Make sure you discuss any questions you have with your health care provider. Document Released: 10/05/2008 Document Revised: 02/08/2016 Document Reviewed: 01/19/2016 Elsevier Interactive Patient Education  [employer].

## 2018-01-03 ENCOUNTER — Encounter: Payer: Self-pay | Admitting: Pharmacist

## 2018-01-07 LAB — CUP PACEART REMOTE DEVICE CHECK
Battery Remaining Longevity: 169 mo
Brady Statistic AP VP Percent: 0.37 %
Brady Statistic AP VS Percent: 16.73 %
Brady Statistic AS VP Percent: 0.32 %
Brady Statistic AS VS Percent: 82.59 %
Date Time Interrogation Session: 20190612212906
Implantable Lead Implant Date: 20180604
Implantable Lead Location: 753859
Implantable Lead Model: 5076
Lead Channel Impedance Value: 323 Ohm
Lead Channel Impedance Value: 399 Ohm
Lead Channel Impedance Value: 437 Ohm
Lead Channel Pacing Threshold Amplitude: 0.625 V
Lead Channel Pacing Threshold Pulse Width: 0.4 ms
Lead Channel Sensing Intrinsic Amplitude: 3 mV
Lead Channel Sensing Intrinsic Amplitude: 4.5 mV
Lead Channel Sensing Intrinsic Amplitude: 4.5 mV
Lead Channel Setting Pacing Amplitude: 1.5 V
Lead Channel Setting Pacing Amplitude: 2.5 V
MDC IDC LEAD IMPLANT DT: 20180604
MDC IDC LEAD LOCATION: 753860
MDC IDC MSMT BATTERY VOLTAGE: 3.11 V
MDC IDC MSMT LEADCHNL RA PACING THRESHOLD AMPLITUDE: 0.5 V
MDC IDC MSMT LEADCHNL RA PACING THRESHOLD PULSEWIDTH: 0.4 ms
MDC IDC MSMT LEADCHNL RV IMPEDANCE VALUE: 494 Ohm
MDC IDC MSMT LEADCHNL RV SENSING INTR AMPL: 3 mV
MDC IDC PG IMPLANT DT: 20180604
MDC IDC SET LEADCHNL RV PACING PULSEWIDTH: 0.4 ms
MDC IDC SET LEADCHNL RV SENSING SENSITIVITY: 1.2 mV
MDC IDC STAT BRADY RA PERCENT PACED: 17.72 %
MDC IDC STAT BRADY RV PERCENT PACED: 0.68 %

## 2018-01-14 ENCOUNTER — Other Ambulatory Visit: Payer: Self-pay | Admitting: Family Medicine

## 2018-01-14 NOTE — Telephone Encounter (Signed)
Copied from [place] [id number]. Topic: Quick Communication - Rx Refill/Question >> Jan 14, 2018  4:55 PM Cecelia Byars, NT wrote: Medication: oxycodone (ROXICODONE) 30 MG immediate release tablet  Has the patient contacted their pharmacy? yes (Agent: If no, request that the patient contact the pharmacy for the refill. (Agent: If yes, when and what did the pharmacy advise?  Preferred Pharmacy (with phone number or street name)[id number] - [place], [place] - [address] [place] [address] [phone] (Phone) [phone] (Fax      Agent: Please be advised that RX refills may take up to 3 business days. We ask that you follow-up with your pharmacy.

## 2018-01-15 NOTE — Telephone Encounter (Signed)
Refill of Oxycodone  LRF  12/17/17  #120  0 refills   LOV 12/17/17  Dr. Sarajane Jews  [id number] - [place], [place] - [address]           [phone] (Phone) [phone] (Fax

## 2018-01-17 MED ORDER — OXYCODONE HCL 30 MG PO TABS: 30.0000 mg | ORAL_TABLET | Freq: Four times a day (QID) | ORAL | 0 refills | Status: AC | PRN

## 2018-01-17 NOTE — Telephone Encounter (Signed)
[name], [name] 01/17/2018 01:14 PM  Summary: Rx refill    Pt calling back to check the status of oxycodone (ROXICODONE) 30 MG immediate release tablet. Pt request call back. Cb# [phone]

## 2018-01-17 NOTE — Telephone Encounter (Signed)
Pt has called to check status of refill and would like this done before end of day please.

## 2018-01-17 NOTE — Telephone Encounter (Signed)
Done

## 2018-01-17 NOTE — Telephone Encounter (Signed)
[facility] and they stated that the pt does NOT have any Rx's on file for Oxy  Dose was increased from 20 MG to 30 MG   Last Rx was picked up on 12/17/2017   Sent to PCP to advise

## 2018-01-22 ENCOUNTER — Other Ambulatory Visit: Payer: Self-pay | Admitting: Family Medicine

## 2018-01-22 ENCOUNTER — Telehealth: Payer: Self-pay | Admitting: Family Medicine

## 2018-01-22 NOTE — Telephone Encounter (Signed)
Last Rx given on 12/13 #270 with 1 ref

## 2018-01-22 NOTE — Telephone Encounter (Signed)
Refill request on Klonopin; last refill 07/04/18; #270; RF x 1  Last office visit 12/17/17  PCP: Dr. Sarajane Jews  [facility] [address] [place]   Klonopin already pended for MD review.

## 2018-01-22 NOTE — Telephone Encounter (Signed)
Copied from [place] [id number]. Topic: Quick Communication - Rx Refill/Question >> Jan 22, 2018  1:02 PM Waldemar Dickens, Sade R wrote: Medication:clonazePAM (KLONOPIN) 0.5 MG tablet  Has the patient contacted their pharmacy? Yes (Agent: If no, request that the patient contact the pharmacy for the refill.) (Agent: If yes, when and what did the pharmacy advise?)  Preferred Pharmacy (with phone number or street name): [id number] [place], [place] - [address] [phone] (Phone) [phone] (Fax)      Agent: Please be advised that RX refills may take up to 3 business days. We ask that you follow-up with your pharmacy.

## 2018-01-24 MED ORDER — CLONAZEPAM 0.5 MG PO TABS: 0.5000 mg | ORAL_TABLET | Freq: Three times a day (TID) | ORAL | 1 refills | Status: DC | PRN

## 2018-01-24 NOTE — Telephone Encounter (Signed)
Rx done-see prior note. 

## 2018-01-24 NOTE — Telephone Encounter (Signed)
Call in #[phone] with one rf 

## 2018-01-24 NOTE — Telephone Encounter (Signed)
Rx done. 

## 2018-01-28 ENCOUNTER — Other Ambulatory Visit: Payer: Self-pay | Admitting: Family Medicine

## 2018-01-28 NOTE — Telephone Encounter (Signed)
Last OV 12/17/2017   Last refilled 07/19/2017 disp 270 with no refills   Sent to PCP for approval

## 2018-02-04 DIAGNOSIS — Z808 Family history of malignant neoplasm of other organs or systems: Secondary | ICD-10-CM | POA: Diagnosis not present

## 2018-02-04 DIAGNOSIS — Z1379 Encounter for other screening for genetic and chromosomal anomalies: Secondary | ICD-10-CM | POA: Diagnosis not present

## 2018-02-04 DIAGNOSIS — Z1509 Genetic susceptibility to other malignant neoplasm: Secondary | ICD-10-CM | POA: Diagnosis not present

## 2018-02-10 ENCOUNTER — Other Ambulatory Visit: Payer: Self-pay | Admitting: Family Medicine

## 2018-02-11 NOTE — Telephone Encounter (Signed)
Pt is due for a pain management OV for more refills

## 2018-02-11 NOTE — Telephone Encounter (Signed)
LOV  12/17/17 Dr. Sarajane Jews Last refill 01/17/18  # 120 with 0 refill

## 2018-02-12 NOTE — Telephone Encounter (Signed)
Pt advised and schedule for a PMV

## 2018-02-18 ENCOUNTER — Encounter: Payer: Self-pay | Admitting: Family Medicine

## 2018-02-18 ENCOUNTER — Ambulatory Visit (INDEPENDENT_AMBULATORY_CARE_PROVIDER_SITE_OTHER): Payer: PPO | Admitting: Family Medicine

## 2018-02-18 VITALS — BP 120/72 | HR 56 | Temp 97.6°F | Wt 218.0 lb

## 2018-02-18 DIAGNOSIS — F1190 Opioid use, unspecified, uncomplicated: Secondary | ICD-10-CM | POA: Diagnosis not present

## 2018-02-18 DIAGNOSIS — M5440 Lumbago with sciatica, unspecified side: Secondary | ICD-10-CM

## 2018-02-18 MED ORDER — OXYCODONE HCL 30 MG PO TABA: 30.0000 mg | ORAL_TABLET | Freq: Four times a day (QID) | ORAL | 0 refills | Status: DC | PRN

## 2018-02-18 MED ORDER — OXYCODONE HCL 30 MG PO TABA: 30.0000 mg | ORAL_TABLET | Freq: Four times a day (QID) | ORAL | 0 refills | Status: AC | PRN

## 2018-02-18 NOTE — Progress Notes (Signed)
   Subjective:    Patient ID: [name], male    DOB: 1940-09-23, 77 y.o.   MRN: [id number]  HPI Here for pain management. He is doing well and the right leg is feeling better.  Indication for chronic opioid: low back pain Medication and dose: Oxycodone 30 mg # pills per month: 120 Last UDS date: 09-02-17 Opioid Treatment Agreement signed (Y/N): 12-17-17 Opioid Treatment Agreement last reviewed with patient:  02-18-18 N[facility]SRS reviewed this encounter (include red flags):  02-18-18    Review of Systems  Constitutional: Negative.   Respiratory: Negative.   Cardiovascular: Negative.   Musculoskeletal: Positive for arthralgias and back pain.  Neurological: Negative.        Objective:   Physical Exam  Constitutional: He is oriented to person, place, and time. He appears well-developed and well-nourished.  Cardiovascular: Normal rate, regular rhythm, normal heart sounds and intact distal pulses.  Pulmonary/Chest: Effort normal and breath sounds normal.  Neurological: He is alert and oriented to person, place, and time.          Assessment & Plan:  Pain management, meds were refilled.  [name], MD

## 2018-02-27 ENCOUNTER — Ambulatory Visit (INDEPENDENT_AMBULATORY_CARE_PROVIDER_SITE_OTHER): Payer: PPO | Admitting: Vascular Surgery

## 2018-02-27 ENCOUNTER — Encounter: Payer: Self-pay | Admitting: Vascular Surgery

## 2018-02-27 ENCOUNTER — Other Ambulatory Visit: Payer: Self-pay

## 2018-02-27 VITALS — BP 122/79 | HR 67 | Temp 98.0°F | Resp 20 | Ht 69.0 in | Wt 217.0 lb

## 2018-02-27 DIAGNOSIS — I83891 Varicose veins of right lower extremities with other complications: Secondary | ICD-10-CM
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
## 2018-03-25 ENCOUNTER — Telehealth: Payer: Self-pay | Admitting: Family Medicine
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
## 2018-03-25 NOTE — Telephone Encounter (Signed)
Sen to PCP to advise  
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
## 2018-03-26 NOTE — Telephone Encounter (Signed)
Yes I intended to order it that way
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
## 2018-04-01 ENCOUNTER — Other Ambulatory Visit: Payer: PPO

## 2018-04-02 ENCOUNTER — Ambulatory Visit (INDEPENDENT_AMBULATORY_CARE_PROVIDER_SITE_OTHER): Payer: PPO | Admitting: *Deleted

## 2018-04-02 DIAGNOSIS — I495 Sick sinus syndrome: Secondary | ICD-10-CM | POA: Diagnosis not present

## 2018-04-02 NOTE — Progress Notes (Signed)
Remote pacemaker transmission.   

## 2018-04-03 ENCOUNTER — Ambulatory Visit: Payer: PPO

## 2018-04-03 NOTE — Progress Notes (Deleted)
Patient ID: [name]                 DOB: [birth year]                    MRN: [id number]     HPI: [name] is a 77 y.o. male patient of Dr. [name] that presents today for lipid evaluation.  PMH includes CAD on cardiac catheterization 2009 and 2014 with nonobstructive disease.  Status post DES to the LAD and RCA 10/2014, repeat cath 11/2014 due to dyspnea with stable disease, Brilinta stopped & symptoms improved.  Nuclear stress test 12/2016 large apical anterior apical and inferior apical scar but no ischemia.  Also has hypertension, HLD-intolerant to statins, on Zetia, sleep apnea, sinus node dysfunction and atrial flutter status post pacemaker 12/24/2016. He has had difficulty tolerating statin medication in the past.  At his last visit with lipid clinic he was restarted on ezetimibe 10mg  daily as he had not been taking this. He did not come for his repeat lipid panel. Will draw this today.   He presents today for follow up discussion of cholesterol.   Risk Factors: CAD LDL Goal: <70, nonHDL <100  Current Medications: ezetimibe 10mg  daily Intolerances: Lipitor, Crestor (muscle aches - hurts so bad he lays in the floor and cries - mostly legs which happened after 1 dose - which improved in 24-36 hours)  Diet: Has not been eating as much. He does eat meat every day. He east fish, chicken, ham, steak, hamburger. He does eat vegetables regularly. He does not eat anything baked. He does admit to fried chicken. He does drink sweet tea mostly.   Exercise: He is limited due to low iron.   Family History: Alcoholism in his maternal grandfather and paternal uncle; Heart attack in his father; Heart disease in his father; Leukemia in his mother; Stroke in his father.   Social History: former smoker, denies alcohol  Labs: 12/11/17:  TC 148, TG 113, HDL 38, LDL 87, nonHDL 110 (no therapy)  Past Medical History:  Diagnosis Date  . Adenomatous polyp of colon 2007  . Arthritis   . Atrial  flutter ([place])   . AVM (arteriovenous malformation) 2011   a. S/p argon plasma coagulation and ablation in 2011, 2017.  . Bradycardia    a. H/o almost 7sec pause nocturnally during 2011 admission. Also has h/o fatigue with BB.  [name] CAD (coronary artery disease)    a. Nonobst disease by cath 2009. b. s/p PCI 10/2014 with DES to [place], patent by relook 11/2014 (Brilinta changed to Plavix with improved sx).  . Chronic diastolic CHF (congestive heart failure) ([place])   . Depression   . Diverticulosis   . Gastritis 2011  . GERD (gastroesophageal reflux disease)   . History of hiatal hernia   . Hyperlipidemia   . Hypertension   . Hypothyroidism   . Myocardial infarction ([place])   . Obstructive sleep apnea    -no cpap use  . Premature atrial contractions Holter 2016  . PVC's (premature ventricular contractions) Holter 2016  . Statin intolerance   . Transfusion history    several years ago -GI bleed    Current Outpatient Medications on File Prior to Visit  Medication Sig Dispense Refill  . Ascorbic Acid (VITAMIN C PO) Take 4 tablets by mouth daily as needed (flu like symptopms).     . clonazePAM (KLONOPIN) 0.5 MG tablet Take 1 tablet (0.5 mg total) by mouth  3 (three) times daily as needed. 270 tablet 1  . diclofenac sodium (VOLTAREN) 1 % GEL Apply 1 application topically daily as needed (ankle pain). 5 Tube 10  . diltiazem (CARDIZEM CD) 120 MG 24 hr capsule TAKE 1 CAPSULE BY MOUTH EVERY DAY 90 capsule 3  . ezetimibe (ZETIA) 10 MG tablet Take 1 tablet (10 mg total) by mouth daily. 90 tablet 3  . fluticasone (FLONASE) 50 MCG/ACT nasal spray Place 1 spray into both nostrils daily as needed for allergies or rhinitis. 48 g 3  . furosemide (LASIX) 20 MG tablet Take 1 tablet (20 mg total) by mouth every other day. 15 tablet 10  . gabapentin (NEURONTIN) 100 MG capsule TAKE 1 CAPSULE BY MOUTH THREE TIMES A DAY 270 capsule 1  . levothyroxine (SYNTHROID, LEVOTHROID) 175 MCG tablet TAKE 1 TABLET (175  MCG TOTAL) BY MOUTH DAILY BEFORE BREAKFAST. 90 tablet 2  . methocarbamol (ROBAXIN) 750 MG tablet Take 750 mg by mouth 4 (four) times daily as needed for muscle spasms.     . nitroGLYCERIN (NITROSTAT) 0.4 MG SL tablet Place 1 tablet (0.4 mg total) under the tongue every 5 (five) minutes as needed for chest pain. 25 tablet 12  . omeprazole (PRILOSEC) 20 MG capsule TAKE 1 CAPSULE BY MOUTH TWICE A DAY 60 capsule 11  . [START ON 04/21/2018] oxyCODONE HCl 30 MG TABA Take 30 mg by mouth every 6 (six) hours as needed (pain). 120 tablet 0  . polyethylene glycol (MIRALAX / GLYCOLAX) packet Take 17 g by mouth daily as needed for mild constipation.    . ropinirole (REQUIP) 5 MG tablet TAKE 2 TABLETS (10 MG TOTAL) BY MOUTH AT BEDTIME. 180 tablet 0   No current facility-administered medications on file prior to visit.     Allergies  Allergen Reactions  . Brilinta [Ticagrelor] Shortness Of Breath  . Metoprolol Tartrate Other (See Comments)    Severe chest pains " flat lined patient"  . Mirapex [Pramipexole Dihydrochloride]     Severe leg pain  . Shellfish Allergy Anaphylaxis and Hives  . Statins Other (See Comments)    All statins cause myalgias   . Zolpidem Other (See Comments)    Chest pain  . Levaquin [Levofloxacin] Hives  . Trazodone And Nefazodone Other (See Comments)    Unsteady on feet    Assessment/Plan: Hyperlipidemia: Draw lipid and hepatic panel today. Pending results continue ezetimibe vs start PCSK9i therapy.   Thank you,  Lelan Pons. Patterson Hammersmith, [facility]  04/03/2018 8:33 AM

## 2018-04-08 ENCOUNTER — Other Ambulatory Visit: Payer: Self-pay

## 2018-04-08 NOTE — Patient Outreach (Signed)
[facility]) Care Management  04/08/2018  SEKOU ZUCKERMAN August 20, 1940 [id number]  TELEPHONE SCREENING Referral date: 03/31/18 Referral source:  HTA referral Referral reason: Medication assistance Insurance: HTA Attempt #1   Telephone call to patient regarding referral. Unable to reach patient. HIPAA compliant voice message left with call back phone number.   PLAN: RNCM will attempt 2nd telephone call to patient within 4 business days. RNCM will send outreach letter.   [name] RN,BSN, [place] Telephonic  [phone]

## 2018-04-14 ENCOUNTER — Other Ambulatory Visit: Payer: Self-pay

## 2018-04-14 NOTE — Patient Outreach (Signed)
[facility]) Care Management  04/14/2018  [name] Mar 03, 1941 [id number]  TELEPHONE SCREENING Referral date: 03/31/18 Referral source:  HTA referral Referral reason: Medication assistance Insurance: HTA Attempt #2   Telephone call to patient regarding referral. Unable to reach patient. HIPAA compliant voice message left with call back phone number.   PLAN: RNCM will attempt 3rd telephone call to patient within 4 business days.    [name] RN,BSN, [place] Telephonic  [phone]

## 2018-04-17 ENCOUNTER — Other Ambulatory Visit: Payer: Self-pay | Admitting: *Deleted

## 2018-04-17 ENCOUNTER — Encounter: Payer: Self-pay | Admitting: *Deleted

## 2018-04-17 ENCOUNTER — Other Ambulatory Visit: Payer: Self-pay
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
## 2018-04-17 NOTE — Patient Outreach (Signed)
[facility]) Care Management  04/17/2018  [name] Dec 22, 1940 [id number]   CSW was able to make initial contact with patient today to perform phone assessment, as well as assess and assist with social work needs and services.  CSW introduced self, explained role and types of services provided through [place] [facility] ([place] [facility]).  CSW further explained to patient that CSW works with patient's Telephonic RNCM, also with [place] [facility], [name]. CSW then explained the reason for the call, indicating that Mrs. [name] thought that patient would benefit from social work services and resources to assist with paying for his medications, medical expenses and food, preparing his Advanced Directives ([place] documents) as well as providing counseling and supportive services for symptoms of depression.  CSW obtained two HIPAA compliant identifiers from patient, which included patient's name and date of birth.  Patient was most appreciative when he heard that he may be receiving assistance with paying for his medications.  CSW explained to patient that a referral has been made to [name], Pharmacist with [place] [facility], to assess patient's needs.  In addition, CSW has made a referral for patient to [name], social work Social worker, also with [place] [facility], to assist patient with paying for his medical expenses, as well as obtaining food.  Mrs. Peggye [name] will also assist patient with completing his Advanced Directives, providing patient with a copy of a Living Will and a Press photographer document.  Patient indicated that he may be interested in reapplying for Adult Medicaid, through the [place], but reported that he would talk with [name] about eligibility criteria.  Patient reported having Adult  Medicaid for 2 years.  CSW agreed to provide counseling and supportive services to patient, as patient verbalizes experiencing symptoms of depression.  Patient voiced a great deal of concern regarding his 76 year old granddaughter, wanting to speak more in depth with CSW about this in the privacy of his own home.  An initial home visit has been scheduled with patient for Tuesday, April 29, 2018 at [place] will print EMMI information for patient pertaining specifically to "Signs & Symptoms of Depression" to review with patient during the initial home visit.  CSW was able to ensure that patient has the correct contact information for CSW, encouraging patient to contact CSW directly if anything changes or if social work needs are identified in the meantime.  THN CM Care Plan Problem One     Most Recent Value  Care Plan Problem One  Patient is experiencing symptoms of depression.  Role Documenting the Problem One  Clinical Social Worker  Care Plan for Problem One  Active  [facility] Long Term Goal   Patient will have a decrease in symptoms of depression, through counseling and supportive services offered by CSW, within the next 45 days.  THN Long Term Goal Start Date  04/17/18  Interventions for Problem One Long Term Goal  CSW will provide counseling and supportive services to patient to help reduce his symptoms of depression.  THN CM Short Term Goal #1   Patient will meet with CSW for an initial home visit to discuss symptoms of depression, while CSW provides counseling and supportive services, within the next two weeks.  THN CM Short Term Goal #1 Start Date  04/17/18  Interventions for Short Term Goal #1  An initial home visit has been  scheduled with patient for Tuesday, April 29, 2018 at [place] Short Term Goal #2   Patient will review EMMI information provided to him by CSW, pertaining specifically to "Signs & Symptoms of Depression", within the next three weeks.  THN CM Short Term Goal #2  Start Date  04/17/18  Interventions for Short Term Goal #2  CSW will print EMMI information for patient to review at the initial home visit.  THN CM Short Term Goal #3  Patient will practice deep breathing exercises and relaxation techniques to help cope with symptoms of depression, within the next 30 days.  THN CM Short Term Goal #3 Start Date  04/17/18  Interventions for Short Tern Goal #3  CSW will teach patient proper use of deep breathing exercises and relaxation techniques to help cope with symptoms of depression.     [name], BSW, MSW, LCSW  Licensed Education officer, environmental Health System  Mailing [place] [address] [place], [place] [address] Physical Address-[address], [place], [place] [address] Toll Free Main # [phone] Fax # [phone] Cell # [phone]  Office # [phone] [email or web address]

## 2018-04-18 ENCOUNTER — Other Ambulatory Visit: Payer: Self-pay

## 2018-04-18 ENCOUNTER — Telehealth: Payer: Self-pay | Admitting: Family Medicine

## 2018-04-18 NOTE — Patient Outreach (Signed)
[facility]) Care Management  04/18/2018  [name] 1941-07-09 [id number]  77 year old male referred to [place] [facility].  [place] services requested for medication assistance for Eliquis.  PMHx includes, but not limited to, coronary artery disease, hypertension, atrial flutter, GERD, hypothyroidism and hyperlipidemia.   Unsuccessful outreach attempt #1 to [name].  Left HIPAA compliant voice message requesting a return call.  Plan: Outreach attempt #2 in 3-4 business days.  Will send unsuccessful outreach attempt letter.  [name], PharmD Clinical Pharmacist [place] [phone]

## 2018-04-18 NOTE — Telephone Encounter (Signed)
Copied from [place] [phone]. Topic: General - Other >> Apr 18, 2018  4:25 PM Janace Aris A wrote: Reason for CRM: Roman called in to follow up on paper work that he faxed over to Dr. Sarajane Jews. Regarding a medical necessity for the patient.     Please give him a call back. [phone]

## 2018-04-21 ENCOUNTER — Telehealth: Payer: Self-pay

## 2018-04-21 ENCOUNTER — Other Ambulatory Visit: Payer: Self-pay

## 2018-04-21 NOTE — Telephone Encounter (Signed)
I received a call from [name], [facility] stating that the pt has reached out to [facility] for help getting his Eliquis as he has advised them that he has not taken any anticoagulant in 2 to 3 months due to cost.   Anderson Malta states that she thinks he is eligible for pt asst.   Please see phone note from 11/05/17.  The pt was on the call as well with Anderson Malta and I and I cautioned the pt on stopping a medication without letting his MD know and that he must let us know so we can find an alternative to the medication that he cannot afford.   Anderson Malta states that she has discussed the dangers of him stopping a medication without his MDs knowledge as well.  Please advise on what anticoagulant medication the pt should be on.

## 2018-04-21 NOTE — Telephone Encounter (Signed)
**Note De-Identified  Obfuscation** I have called [name] [facility]) at [facility] [phone]) and made her aware of Dr Camillia Herter recommendation.  [name] states that she will contact the pt with an update and to let him know that I have left him 4 weeks of Eliquis samples in the front office at [facility] in [place] for him to pick up.

## 2018-04-21 NOTE — Telephone Encounter (Signed)
It looks like he had been on Xarelto but in [place] office note in May 2019 she talked about changing to Eliquis 5 mg BID as he had been approved for the assistance program. He was given samples. He needs to be on Eliquis or coumadin if he cannot afford Xarelto. Pat, Can you help me with this? Thanks, chris

## 2018-04-21 NOTE — Patient Outreach (Signed)
[facility]) Care Management  04/21/2018  [name] December 23, 1940 [id number]  77 year old male referred to [place] Management.  [place] services requested for medication assistance for Eliquis.  PMHx includes, but not limited to, coronary artery disease, hypertension, atrial flutter, GERD, hypothyroidism and hyperlipidemia.  Incoming call received from [name].  HIPAA identifiers verified.  Medication Assistance: [name] states that he is not able to afford Eliquis.  He reports that he was originally given 2-3 months of samples and was denied patient assistance from [employer] earlier this spring.   He reports that he has been without Eliquis for 2-3 months because he couldn't pay $135/mo for this medication.  Patient also reports that he stopped his diltiazem because he couldn't afford it either.  Patient states that he does not want to take "rat poison" when discussing coumadin.  He states he is adamant that he is not going to take coumadin.  He reports that he doesn't want to have his blood checked and doesn't have the money for gas to go back and forth to the doctor's office.  [name] states he will [facility] and see how much a month of Diltiazem cost.    Spoke with [name] at Dr. Camillia Herter office and verified that patient should be on Eliquis 5 mg twice daily and Diltiazem CD 120 mg daily.  Jeani Hawking has 3-4 weeks of samples of Eliquis that she can give  [name].  Patient states that he will pick it up tomorrow morning.   Patient states that his wife is 69 yo and therefore not on Medicare.  She has Stage 3-4 lung cancer and his SSA check is all the money they get each month.  [place] completed an online application for Extra Help LIS today.  Per [employer], [name] has a TROOP of [phone] and is in the coverage gap.  This TROOP will not met [place] requirement of 3% of income.  He will need to spend ~ $250 more dollars to be  eligible.   Plan: Outreach to [name] in a month to see if he has heard from [place].    Route note to Dr. [name].   [name], PharmD Clinical Pharmacist [place] [phone]

## 2018-04-22 ENCOUNTER — Other Ambulatory Visit: Payer: Self-pay

## 2018-04-22 NOTE — Patient Outreach (Signed)
[facility]) Care Management  04/22/2018  [name] 1940/08/07 [id number]  Successful outreach to the patient on today's date, HIPAA identifiers confirmed. BSW introduced self to the patient and the reason for today's call, indicating the patient had been referred for assistance with financial resources as well as the completion of an advance directive.   The patient declines to work with this BSW on an advance directive stating his step-son is his power of attorney and he "knows what I want". BSW educated the patient on the difference between a financial power of attorney and a healthcare power of attorney. The patient continued to decline working on an advance directive at this time.  The patient reports his wife's cancer just came back after being cancer free for two years. The patient further reports his wife had the lower portion of her left lung removed due to the cancer diagnosis. The patient reports a financial strain due to his wife being 69 and unable to work nor draw Fish farm manager. BSW spoke with the patient about assisting him and his wife in applying for disability benefits given she has "stage 3 or 4" cancer and is unable to work. The patient declined this assistance stating they have tried to do this and were told she does no qualify.   The patient's monthly income is reported to be "21 something". The source of the patients income is Fish farm manager. The patient states "my retirement was lost by [employer]". The patient further reports he used to have Medicaid until two years ago when he forgot to reapply. The patient would like assistance in reapplying for this benefit. The patient is agreeable to BSW mailing a Medicaid application to his home.  The patient reports he has applied for assistance through [employer] and [place] but was only allotted $15.00 per month in benefits. The patient stated "that will only buy me a loaf of bread and milk". BSW  encouraged the patient to reapply as it can help supplement what he is already purchasing. BSW also discussed lower priced grocery stores in the patients demographic area.  BSW offered to mail the patient a list of local food pantries, however the patient reports he already accesses 3 local food pantries in [place]. The patient is agreeable to this BSW mailing both a Medicaid and [employer] application. BSW will follow up with the patient in the next two weeks to confirm receipt and assist with completion of the above mentioned applications.  [name], BSW, CDP [facility] [phone]

## 2018-04-23 ENCOUNTER — Ambulatory Visit: Payer: PPO

## 2018-04-23 LAB — CUP PACEART REMOTE DEVICE CHECK
Battery Voltage: 3.07 V
Brady Statistic AP VP Percent: 2.01 %
Brady Statistic AP VS Percent: 16.45 %
Brady Statistic AS VP Percent: 2.19 %
Brady Statistic RA Percent Paced: 18.68 %
Date Time Interrogation Session: 20190911042827
Implantable Lead Implant Date: 20180604
Implantable Lead Location: 753859
Implantable Lead Location: 753860
Implantable Lead Model: 5076
Implantable Lead Model: 5076
Implantable Pulse Generator Implant Date: 20180604
Lead Channel Impedance Value: 304 Ohm
Lead Channel Pacing Threshold Amplitude: 0.875 V
Lead Channel Pacing Threshold Pulse Width: 0.4 ms
Lead Channel Pacing Threshold Pulse Width: 0.4 ms
Lead Channel Sensing Intrinsic Amplitude: 2.25 mV
Lead Channel Sensing Intrinsic Amplitude: 2.25 mV
Lead Channel Sensing Intrinsic Amplitude: 4.5 mV
Lead Channel Setting Pacing Amplitude: 1.75 V
Lead Channel Setting Pacing Amplitude: 2.5 V
Lead Channel Setting Pacing Pulse Width: 0.4 ms
MDC IDC LEAD IMPLANT DT: 20180604
MDC IDC MSMT BATTERY REMAINING LONGEVITY: 166 mo
MDC IDC MSMT LEADCHNL RA IMPEDANCE VALUE: 513 Ohm
MDC IDC MSMT LEADCHNL RA SENSING INTR AMPL: 4.5 mV
MDC IDC MSMT LEADCHNL RV IMPEDANCE VALUE: 380 Ohm
MDC IDC MSMT LEADCHNL RV IMPEDANCE VALUE: 437 Ohm
MDC IDC MSMT LEADCHNL RV PACING THRESHOLD AMPLITUDE: 0.75 V
MDC IDC SET LEADCHNL RV SENSING SENSITIVITY: 1.2 mV
MDC IDC STAT BRADY AS VS PERCENT: 79.34 %
MDC IDC STAT BRADY RV PERCENT PACED: 4.21 %

## 2018-04-23 NOTE — Telephone Encounter (Signed)
SP#[id number] VSY:[phone] alt Fax:[phone] Original callback number listed incorrect regarding medical necessity form that was faxed. Please update.

## 2018-04-29 ENCOUNTER — Other Ambulatory Visit: Payer: Self-pay | Admitting: *Deleted
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
## 2018-05-01 DIAGNOSIS — L97819 Non-pressure chronic ulcer of other part of right lower leg with unspecified severity: Secondary | ICD-10-CM | POA: Diagnosis not present

## 2018-05-01 DIAGNOSIS — L97829 Non-pressure chronic ulcer of other part of left lower leg with unspecified severity: Secondary | ICD-10-CM | POA: Diagnosis not present

## 2018-05-01 DIAGNOSIS — R609 Edema, unspecified: Secondary | ICD-10-CM | POA: Diagnosis not present

## 2018-05-01 DIAGNOSIS — I872 Venous insufficiency (chronic) (peripheral): Secondary | ICD-10-CM | POA: Diagnosis not present

## 2018-05-02 NOTE — Telephone Encounter (Signed)
Form was completed and faxed back

## 2018-05-05 ENCOUNTER — Other Ambulatory Visit: Payer: Self-pay

## 2018-05-05 NOTE — Patient Outreach (Signed)
[facility]) Care Management  05/05/2018  [name] 07/09/1941 [id number]  Successful outreach to the patient on today's date, HIPAA identifiers confirmed. The patient states he has not received applications this BSW mailed. The patient has a scheduled home visit this week with [place]. BSW will ask Mrs. Saporito to bring applications to the patient. BSW will sign off on case as he is actively working with [place].  [name], BSW, CDP [facility] [phone]

## 2018-05-07 ENCOUNTER — Other Ambulatory Visit: Payer: Self-pay | Admitting: *Deleted
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
## 2018-05-13 ENCOUNTER — Other Ambulatory Visit: Payer: Self-pay | Admitting: *Deleted

## 2018-05-13 NOTE — Patient Outreach (Signed)
[facility]) Care Management  05/13/2018  [name] January 25, 1941 [id number]   CSW was able to make contact with patient today to see about rescheduling the routine home visit that patient needed to cancel last week due to a family emergency.  Patient reported that he is currently in [place], [place] to pick up his granddaughter from the hospital, as she is being released today.  Patient was not sure how long he would need to reside in [place]; therefore, he requested that we schedule the visit for next week.  Patient and CSW both agreed to meet on Tuesday, May 20, 2018 at 9:00 AM.  Patient agreed to contact CSW if anything changes or if he needs to reschedule the visit. THN CM Care Plan Problem One     Most Recent Value  Care Plan Problem One  Patient is experiencing symptoms of depression.  Role Documenting the Problem One  Clinical Social Worker  Care Plan for Problem One  Active  [facility] Long Term Goal   Patient will have a decrease in symptoms of depression, through counseling and supportive services offered by CSW, within the next 45 days.  THN Long Term Goal Start Date  04/17/18  [facility] CM Short Term Goal #1   Patient will meet with CSW for an initial home visit to discuss symptoms of depression, while CSW provides counseling and supportive services, within the next two weeks.  THN CM Short Term Goal #1 Start Date  04/17/18  THN CM Short Term Goal #1 Met Date  04/29/18  THN CM Short Term Goal #2   Patient will review EMMI information provided to him by CSW, pertaining specifically to "Signs & Symptoms of Depression", within the next three weeks.  THN CM Short Term Goal #2 Start Date  04/17/18  [facility] CM Short Term Goal #2 Met Date  04/29/18  THN CM Short Term Goal #3  Patient will practice deep breathing exercises and relaxation techniques to help cope with symptoms of depression, within the next 30 days.  THN CM Short Term Goal #3 Start Date  04/17/18    [facility] CM Care Plan Problem Two     Most Recent Value  Care Plan Problem Two  Lack of financial resources.  Role Documenting the Problem Two  Clinical Social Worker  Care Plan for Problem Two  Active  THN CM Short Term Goal #1   Patient will complete a [place] application, with the assistance of CSW, within the next 30 days.  THN CM Short Term Goal #1 Start Date  04/29/18  [facility] CM Short Term Goal #2   Patient will complete an Adult Medicaid application, with the assistance of CSW, within the next 30 days.  THN CM Short Term Goal #2 Start Date  04/29/18    [facility] CM Care Plan Problem Three     Most Recent Value  Care Plan Problem Three  No Advanced Directives in place.  Role Documenting the Problem Three  Clinical Social Worker  Care Plan for Problem Three  Active  [facility] CM Short Term Goal #1   Patient will complete Living Will and [place] documents, with the assistance of CSW, within the next two weeks.  THN CM Short Term Goal #1 Start Date  04/29/18    Nat Christen, BSW, MSW, [place]  Licensed Clinical Social Worker  [place]  Mailing [place]. [address], [place], [place] [address] Physical Address-[address], [place], [place] [address] Toll Free Main # [phone] Fax # [phone] Cell # [phone]  Office # [phone] [email or web address]

## 2018-05-19 ENCOUNTER — Other Ambulatory Visit: Payer: Self-pay | Admitting: Family Medicine

## 2018-05-19 NOTE — Telephone Encounter (Signed)
Copied from [place] [id number]. Topic: Quick Communication - Rx Refill/Question >> May 19, 2018 11:40 AM Chauncey Mann A wrote: Medication: oxyCODONE HCl 30 MG TABA  Has the patient contacted their pharmacy? Yes.   (Agent: If no, request that the patient contact the pharmacy for the refill.) (Agent: If yes, when and what did the pharmacy advise?)  Preferred Pharmacy (with phone number or street name): [id number] [place], [place] - [address] [phone] (Phone) [phone] (Fax)    Agent: Please be advised that RX refills may take up to 3 business days. We ask that you follow-up with your pharmacy.

## 2018-05-20 ENCOUNTER — Other Ambulatory Visit: Payer: Self-pay | Admitting: *Deleted

## 2018-05-20 NOTE — Telephone Encounter (Signed)
Requested medication (s) are due for refill today: yes  Requested medication (s) are on the active medication list: yes  Last refill:  04/21/18  Future visit scheduled: yes  Notes to clinic:  undelegated    Requested Prescriptions  Pending Prescriptions Disp Refills   oxyCODONE HCl 30 MG TABA 120 tablet 0    Sig: Take 30 mg by mouth every 6 (six) hours as needed (pain).     Not Delegated - Analgesics:  Opioid Agonists Failed - 05/19/2018  2:41 PM      Failed - This refill cannot be delegated      Failed - Urine Drug Screen completed in last 360 days.      Passed - Valid encounter within last 6 months    Recent Outpatient Visits          3 months ago Chronic narcotic use   [facility] [place], [place]   5 months ago Chronic narcotic use   [facility] [place], [place]   6 months ago Acute sinusitis, recurrence not specified, unspecified location   [facility], Ishmael Holter, MD   7 months ago Right foot pain   [facility], Ishmael Holter, MD   8 months ago Idiopathic gout, unspecified chronicity, unspecified site   [facility] at [facility], Ishmael Holter, MD      Future Appointments            In 2 days [name], MD [facility] at [facility], [facility]

## 2018-05-20 NOTE — Patient Outreach (Signed)
[name] [facility]) Care Management  05/20/2018  [name] February 14, 1941 [id number]   CSW was scheduled to meet with patient today (Tuesday, May 20, 2018 at 9:00 AM) for a routine home visit; however, when CSW arrived at patient's home, CSW noted that patient was experiencing a great deal of pain and discomfort.  Patient was lying back in his recliner, trying to find a comfortable position and not trying to bare weight on his left side.  Patient admitted that he recently fell at his sister-in-law's house, sustaining the brunt of the fall to his left leg and back.  Patient has already scheduled an appointment with his Primary Care Physician, Dr. [name], for Thursday, May 22, 2018 at 11:30 AM at [address] [facility] [place]. Patient requested that we reschedule the visit, as patient admitted that he was not able to concentrate on much of anything, besides the pain.  CSW inquired as to whether or not patient felt like he needed to go to an urgent care center to be checked out, as well as to ensure that he did not break anything.  Patient denied, indicating that he will tolerate the pain until his scheduled appointment.  CSW and patient agreed to meet for the next routine home visit on Wednesday, May 28, 2018 at 8:30 AM.  During this visit, CSW will assist patient with completion of Advanced Directives (Living Will and [place] documents), as well as assist patient with completion of an Adult Medicaid application through the [place]. THN CM Care Plan Problem One     Most Recent Value  Care Plan Problem One  Patient is experiencing symptoms of depression.  Role Documenting the Problem One  Clinical Social Worker  Care Plan for Problem One  Active  [facility] Long Term Goal   Patient will have a decrease in symptoms of depression, through counseling and supportive services offered by CSW, within the next 45 days.  THN  Long Term Goal Start Date  04/17/18  [facility] CM Short Term Goal #1   Patient will meet with CSW for an initial home visit to discuss symptoms of depression, while CSW provides counseling and supportive services, within the next two weeks.  THN CM Short Term Goal #1 Start Date  04/17/18  THN CM Short Term Goal #1 Met Date  04/29/18  THN CM Short Term Goal #2   Patient will review EMMI information provided to him by CSW, pertaining specifically to "Signs & Symptoms of Depression", within the next three weeks.  THN CM Short Term Goal #2 Start Date  04/17/18  [facility] CM Short Term Goal #2 Met Date  04/29/18  THN CM Short Term Goal #3  Patient will practice deep breathing exercises and relaxation techniques to help cope with symptoms of depression, within the next 30 days.  THN CM Short Term Goal #3 Start Date  04/17/18    [facility] CM Care Plan Problem Two     Most Recent Value  Care Plan Problem Two  Lack of financial resources.  Role Documenting the Problem Two  Clinical Social Worker  Care Plan for Problem Two  Active  THN CM Short Term Goal #1   Patient will complete a  [place] application, with the assistance of CSW, within the next 30 days.  THN CM Short Term Goal #1 Start Date  04/29/18  [facility] CM Short Term Goal #2   Patient will complete an Adult Medicaid application,  with the assistance of CSW, within the next 30 days.  THN CM Short Term Goal #2 Start Date  04/29/18    [facility] CM Care Plan Problem Three     Most Recent Value  Care Plan Problem Three  No Advanced Directives in place.  Role Documenting the Problem Three  Clinical Social Worker  Care Plan for Problem Three  Active  [facility] CM Short Term Goal #1   Patient will complete Living Will and [place] documents, with the assistance of CSW, within the next two weeks.  THN CM Short Term Goal #1 Start Date  04/29/18    Nat Christen, BSW, MSW, [place]  Licensed Clinical Social Worker  [place]  Mailing [place]. [address] [place], [place] [address] Physical Address-[address], [place], [place] [address] Toll Free Main # [phone] Fax # [phone] Cell # [phone]  Office # [phone] [email or web address]

## 2018-05-21 NOTE — Telephone Encounter (Signed)
Dr. Sarajane Jews please advise on refill of oxy.  Last refill was 9/30

## 2018-05-22 ENCOUNTER — Ambulatory Visit (INDEPENDENT_AMBULATORY_CARE_PROVIDER_SITE_OTHER): Payer: PPO | Admitting: Family Medicine

## 2018-05-22 ENCOUNTER — Encounter: Payer: Self-pay | Admitting: Family Medicine

## 2018-05-22 VITALS — BP 132/76 | HR 70 | Temp 97.8°F | Wt 218.2 lb

## 2018-05-22 DIAGNOSIS — M79604 Pain in right leg: Secondary | ICD-10-CM

## 2018-05-22 DIAGNOSIS — Z23 Encounter for immunization: Secondary | ICD-10-CM | POA: Diagnosis not present

## 2018-05-22 DIAGNOSIS — M5440 Lumbago with sciatica, unspecified side: Secondary | ICD-10-CM | POA: Diagnosis not present

## 2018-05-22 DIAGNOSIS — F1190 Opioid use, unspecified, uncomplicated: Secondary | ICD-10-CM

## 2018-05-22 MED ORDER — METHYLPREDNISOLONE ACETATE 80 MG/ML IJ SUSP
120.0000 mg | Freq: Once | INTRAMUSCULAR | Status: AC
  Administered 2018-05-22: 120 mg via INTRAMUSCULAR

## 2018-05-22 MED ORDER — OXYCODONE HCL 30 MG PO TABA: 1.0000 | ORAL_TABLET | Freq: Four times a day (QID) | ORAL | 0 refills | Status: DC | PRN

## 2018-05-22 MED ORDER — OXYCODONE HCL 30 MG PO TABA: 1.0000 | ORAL_TABLET | Freq: Four times a day (QID) | ORAL | 0 refills | Status: AC | PRN

## 2018-05-22 MED ORDER — NITROGLYCERIN 0.4 MG SL SUBL: 0.4000 mg | SUBLINGUAL_TABLET | SUBLINGUAL | 12 refills | Status: DC | PRN

## 2018-05-22 NOTE — Progress Notes (Signed)
   Subjective:    Patient ID: [name], male    DOB: 01/28/1941, 77 y.o.   MRN: [id number]  HPI Here for pain management. He has had some increasing low back pain that radiates down the left leg.  Indication for chronic opioid: low back pain  Medication and dose: Oxycodone 30 mg  # pills per month: 120 Last UDS date: 09-02-17 Opioid Treatment Agreement signed (Y/N): 12-17-17 Opioid Treatment Agreement last reviewed with patient:  05-22-18 [place] reviewed this encounter (include red flags):  05-22-18    Review of Systems  Constitutional: Negative.   Respiratory: Negative.   Cardiovascular: Negative.   Musculoskeletal: Positive for back pain.  Neurological: Positive for weakness and numbness.       Objective:   Physical Exam  Constitutional: He is oriented to person, place, and time. He appears well-developed and well-nourished.  Walks with a cane   Cardiovascular: Normal rate, regular rhythm, normal heart sounds and intact distal pulses.  Pulmonary/Chest: Effort normal and breath sounds normal.  Neurological: He is alert and oriented to person, place, and time.          Assessment & Plan:  Pain management, meds were refilled. Given a steroid shot.  [name], MD

## 2018-05-23 ENCOUNTER — Ambulatory Visit: Payer: PPO | Admitting: Neurology

## 2018-05-27 DIAGNOSIS — H2513 Age-related nuclear cataract, bilateral: Secondary | ICD-10-CM | POA: Diagnosis not present

## 2018-05-27 DIAGNOSIS — H35371 Puckering of macula, right eye: Secondary | ICD-10-CM | POA: Diagnosis not present

## 2018-05-28 ENCOUNTER — Other Ambulatory Visit: Payer: Self-pay | Admitting: *Deleted
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
## 2018-05-28 NOTE — Telephone Encounter (Signed)
He was seen on 05-22-18

## 2018-06-03 ENCOUNTER — Other Ambulatory Visit: Payer: Self-pay

## 2018-06-03 ENCOUNTER — Ambulatory Visit: Payer: Self-pay

## 2018-06-03 NOTE — Patient Outreach (Addendum)
**Note De-Identified [name] Obfuscation** [facility]) Care Management  06/03/2018  [name] 1941-01-03 [id number]  67year oldmalereferred to [place] [facility].[place] services requested for medication assistance for Eliquis. PMHx includes, but not limited to, coronary artery disease, hypertension, atrial flutter, GERD, hypothyroidism and hyperlipidemia.  Successful outreach call to [name]'s wife.  HIPAA identifiers verified.  [name] reports that patient was denied Extra Help LIS.  She requests that I see if cardiologist has samples available.  During last outreach in September, Mr. Hoel had not spent enough out of pocket to be eligile for Eliquis assistance from [employer].   Plan: Outreach to [name] to determine [place].  Outreach to [name] office to see if samples available.   [name], PharmD Clinical Pharmacist [place] [phone]  Addendum: [name] with [name] at cardiologist's office.  The Eliquis drug representative was at the office and they have samples to give [name].   He has still not met [place] for Eliquis assistance.   Outreach call placed to [name].  HIPAA identifiers verified.  Informed him of office samples and he verbalized that he could pick them up.  Encouraged him to call their office and request samples next year when he goes in the donut hole.  Requested that he outreach to me in 2020 and we can evaluate again to see if he is eligible for Eliquis assistance.     Will close [place] case.   Plan: Route discipline closure letter to PCP, [name].  [name], [place] [phone]

## 2018-06-04 ENCOUNTER — Other Ambulatory Visit: Payer: Self-pay | Admitting: *Deleted

## 2018-06-04 NOTE — Patient Outreach (Signed)
[facility]) Care Management  06/04/2018  [name] Sep 14, 1940 [id number]   CSW was able to meet with patient and patient's wife, [name] today to perform a routine home visit.  Patient started the conversation by indicating that he went to see his eye doctor, Dr. Tama High at [facility], on Tuesday, May 27, 2018, only to find out that he needs to have cataract surgery in both eyes.  Patient is scheduled for cataract surgery on July 07, 2018 at 7:45 AM with Dr. Tama High; however, patient does not think he will be able to keep this appointment because he does not have the $250.00 co-pay to pay for each eye, nor does he have $65.00 for the eye drops.  Patient was encouraged to talk with someone in the Billing Office at [facility] to see if they would be willing to work out a payment plan for him.  CSW was planning to assist patient and [name] with completing their Advanced Directives ([name] documents), as well as their Adult Medicaid applications; however, [name] was not feeling well today and requested that we wait until CSW's next scheduled visit.  Patient indicated that his son, [name] is his "full" power of attorney, meaning that he handles all of patient and [name]'s financial and medical affairs and that it is in writing and notarized at the [facility].  CSW explained to patient and Mrs. Pipkins that unless they wanted to change anything on the documents or appointment a new healthcare agent, there is really no reason to complete a new set of documents.  Patient reports always being in pain, despite taking Oxycodone HCI 30 MG, 1 tablet, every 6 hours PRN.  Patient indicated that he plans to talk with his Primary Care Physician, Dr. [name] to see if he would be willing to recommend a pain management specialist.  Patient  is also worried about the amount of pain that [name] is constantly in, really wanting her to see a pain management specialist to get her pain under control.  [name] admits to sleeping all day because she has to take the pain medication, but once she does, "it knocks her out".  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  Patient also reported that he has had to borrow a great deal of money from his son, [name].  Patient admits to practicing deep breathing exercises and relaxation techniques to help reduce symptoms of anxiety and depression.  Patient also indicated that he enjoys working with his hands, making arts and crafts, to try and help cope with his symptoms.  CSW encouraged patient to speak with Dr. Sarajane Jews about prescribing an antidepressant medication, as patient reported that he would be agreeable to taking something to help relieve his symptoms. Patient does not currently have a follow-up appointment with Dr. Sarajane Jews, but reported that he plans to schedule an appointment within the next month.  CSW agreed to meet with patient for the next scheduled home visit on Wednesday, June 11, 2018 at 9:00 AM.  CSW will assist patient and [name] with completing their Adult Medicaid applications.  THN CM Care Plan Problem One     Most Recent Value  Care Plan Problem One  Patient is experiencing symptoms of depression.  Role Documenting the Problem One  Clinical  Social Worker  Care Plan for Problem One  Active  THN Long Term Goal   Patient will have a decrease in symptoms of depression, through counseling and supportive services offered by CSW, within the next 45 days.  THN Long Term Goal Start Date  04/17/18  [facility] CM Short Term Goal #1   Patient will meet with CSW for an initial home visit to discuss symptoms of depression, while CSW provides counseling and supportive services, within the next two weeks.  THN  CM Short Term Goal #1 Start Date  04/17/18  THN CM Short Term Goal #1 Met Date  04/29/18  THN CM Short Term Goal #2   Patient will review EMMI information provided to him by CSW, pertaining specifically to "Signs & Symptoms of Depression", within the next three weeks.  THN CM Short Term Goal #2 Start Date  04/17/18  [facility] CM Short Term Goal #2 Met Date  04/29/18  THN CM Short Term Goal #3  Patient will practice deep breathing exercises and relaxation techniques to help cope with symptoms of depression, within the next 30 days.  THN CM Short Term Goal #3 Start Date  04/17/18  [facility] CM Short Term Goal #3 Met Date  05/28/18    [facility] CM Care Plan Problem Two     Most Recent Value  Care Plan Problem Two  Lack of financial resources.  Role Documenting the Problem Two  Clinical Social Worker  Care Plan for Problem Two  Active  THN CM Short Term Goal #1   Patient will complete a [place] application, with the assistance of CSW, within the next 30 days.  THN CM Short Term Goal #1 Start Date  04/29/18  [facility] CM Short Term Goal #2   Patient will complete an Adult Medicaid application, with the assistance of CSW, within the next 30 days.  THN CM Short Term Goal #2 Start Date  04/29/18    [facility] CM Care Plan Problem Three     Most Recent Value  Care Plan Problem Three  No Advanced Directives in place.  Role Documenting the Problem Three  Clinical Social Worker  Care Plan for Problem Three  Active  [facility] CM Short Term Goal #1   Patient will complete Living Will and [place] documents, with the assistance of CSW, within the next two weeks.  THN CM Short Term Goal #1 Start Date  04/29/18  [facility] CM Short Term Goal #1 Met Date  06/04/18  Interventions for Short Term Goal #1  CSW learned that patient and patient's wife already have their Advanced Directives in place, Living Will and [place] documents.     Nat Christen, BSW, MSW, LCSW  Licensed Teacher, adult education Health System  Mailing [place] [address], [place], [place] [address] Physical Address-[address], [place], [place] [address] Toll Free Main # [phone] Fax # [phone] Cell # [phone]  Office # [phone] [email or web address]

## 2018-06-11 ENCOUNTER — Other Ambulatory Visit: Payer: Self-pay | Admitting: *Deleted

## 2018-06-11 ENCOUNTER — Encounter: Payer: Self-pay | Admitting: *Deleted

## 2018-06-11 NOTE — Patient Outreach (Signed)
[facility]) Care Management  06/11/2018  [name] 06-Nov-1940 [id number]   CSW was able to meet with patient and patient's wife, [name] today to perform the routine home visit.  Patient informed CSW that her daughter and three grandchildren moved in with them last Friday, June 06, 2018 and will be staying with them for the next 8 months.  Patient admitted that he is not happy about the situation, but his daughter and grandchildren had nowhere else to go.  Patient's daughter is currently unemployed and not receiving child support payments so patient reported that he and [name] will have to somehow figure out a way to support a family of 6.  Patient just received a denial letter from [employer] indicating that patient is over-qualified for Extra Help with Medicare Prescription Drug Plan Costs.  The letter stated that patient is not eligible for Extra Help because his income is above the limit set by law to qualify for Extra Help.  It was determined that patient's income is 150% or more of the Federal Poverty Level.  Patient was very discouraged by this, admitting that it is very hard for them to get any help with regards to government funding.  CSW offered counseling and supportive services, as well as attentive listening, while patient proceeded to vent.  CSW was able to assist patient and [name] with completion of Adult Medicaid applications, agreeing to submit them directly to the [place] for processing.  CSW also assisted patient and [name] with completion of [place] applications, also agreeing to submit them to the Billing Office at [facility].  Patient and [name] firmly believe that they will be denied for both, but CSW explained that it never hurts to apply and hopefully they will be pleasantly surprised, especially now that they are supporting their daughter and  three grandchildren.  Patient agreed to continue utilizing his deep breathing exercises and relaxation techniques when he experiences symptoms of anxiety and/or depression.  Patient plans to talk with his Primary Care Physician, Dr. Alysia [name] about starting him on an antidepressant medication during his next scheduled visit.  Patient stated, "I am definitely coping better since I have been seeing you".  CSW commended patient on this, explaining that patient has successfully achieved all of his goals, no longer requiring the assistance of CSW.  Patient was not happy about CSW terminating services, but voiced understanding.  CSW will perform a case closure on patient, as all goals of treatment have been met from social work standpoint and no additional social work needs have been identified at this time.  CSW will notify patient's Pharmacist with [place] [facility], [name] of CSW's plans to close patient's case.  CSW will fax an update to patient's Primary Care Physician, Dr. Alysia [name] to ensure that they are aware of CSW's involvement with patient's plan of care.    THN CM Care Plan Problem One     Most Recent Value  Care Plan Problem One  Patient is experiencing symptoms of depression.  Role Documenting the Problem One  Clinical Social Worker  Care Plan for Problem One  Active  [facility] Long Term Goal   Patient will have a decrease in symptoms of depression, through counseling and supportive services offered by CSW, within the next 45 days.  THN Long Term Goal Start Date  04/17/18  [facility] Long Term Goal Met Date  06/11/18  Interventions for Problem One Long Term Goal  Patient admits to experiencing a reduction in symptoms of anxiety and depression.  THN CM Short Term Goal #1   Patient will meet with CSW for an initial home visit to discuss symptoms of depression, while CSW provides counseling and supportive services, within the next two weeks.  THN CM Short Term Goal #1  Start Date  04/17/18  THN CM Short Term Goal #1 Met Date  04/29/18  THN CM Short Term Goal #2   Patient will review EMMI information provided to him by CSW, pertaining specifically to "Signs & Symptoms of Depression", within the next three weeks.  THN CM Short Term Goal #2 Start Date  04/17/18  [facility] CM Short Term Goal #2 Met Date  04/29/18  THN CM Short Term Goal #3  Patient will practice deep breathing exercises and relaxation techniques to help cope with symptoms of depression, within the next 30 days.  THN CM Short Term Goal #3 Start Date  04/17/18  [facility] CM Short Term Goal #3 Met Date  05/28/18    [facility] CM Care Plan Problem Two     Most Recent Value  Care Plan Problem Two  Lack of financial resources.  Role Documenting the Problem Two  Clinical Social Worker  Care Plan for Problem Two  Active  THN CM Short Term Goal #1   Patient will complete a [place] [employer] application, with the assistance of CSW, within the next 30 days.  THN CM Short Term Goal #1 Start Date  04/29/18  [facility] CM Short Term Goal #1 Met Date   06/11/18  Interventions for Short Term Goal #2   CSW was able to assist patient and his wife with completion of a [place] application and will submit to the Billing Department at [facility] for processing.  THN CM Short Term Goal #2   Patient will complete an Adult Medicaid application, with the assistance of CSW, within the next 30 days.  THN CM Short Term Goal #2 Start Date  04/29/18  [facility] CM Short Term Goal #2 Met Date  06/11/18  Interventions for Short Term Goal #2  CSW was able to assist patient and patient's wife with completion of Adult Medicaid applications, agreeing to submit them to the [place] for processing.    THN CM Care Plan Problem Three     Most Recent Value  Care Plan Problem Three  No Advanced Directives in place.  Role Documenting the Problem Three  Clinical Social Worker  Care Plan for Problem Three   Active  [facility] CM Short Term Goal #1   Patient will complete Living Will and [place] documents, with the assistance of CSW, within the next two weeks.  THN CM Short Term Goal #1 Start Date  04/29/18  [facility] CM Short Term Goal #1 Met Date  06/04/18     Nat Christen, BSW, MSW, [place]  Licensed Clinical Social Worker  [place]  Mailing [place] [address], [place], [place] [address] Physical Address-[address], [place], [place] [address] Toll Free Main # [phone] Fax # [phone] Cell # [phone]  Office # [phone] [email or web address]

## 2018-07-01 ENCOUNTER — Encounter: Payer: Self-pay | Admitting: Family Medicine

## 2018-07-01 ENCOUNTER — Ambulatory Visit (INDEPENDENT_AMBULATORY_CARE_PROVIDER_SITE_OTHER): Payer: PPO

## 2018-07-01 ENCOUNTER — Ambulatory Visit (INDEPENDENT_AMBULATORY_CARE_PROVIDER_SITE_OTHER): Payer: PPO | Admitting: Family Medicine

## 2018-07-01 VITALS — BP 120/70 | HR 71 | Temp 97.6°F | Resp 16 | Ht 69.0 in | Wt 216.1 lb

## 2018-07-01 DIAGNOSIS — R0989 Other specified symptoms and signs involving the circulatory and respiratory systems: Secondary | ICD-10-CM

## 2018-07-01 DIAGNOSIS — R05 Cough: Secondary | ICD-10-CM

## 2018-07-01 DIAGNOSIS — R0981 Nasal congestion: Secondary | ICD-10-CM | POA: Diagnosis not present

## 2018-07-01 DIAGNOSIS — R059 Cough, unspecified: Secondary | ICD-10-CM

## 2018-07-01 DIAGNOSIS — J988 Other specified respiratory disorders: Secondary | ICD-10-CM | POA: Diagnosis not present

## 2018-07-01 DIAGNOSIS — J989 Respiratory disorder, unspecified: Secondary | ICD-10-CM

## 2018-07-01 MED ORDER — BENZONATATE 100 MG PO CAPS: 200.0000 mg | ORAL_CAPSULE | Freq: Two times a day (BID) | ORAL | 0 refills | Status: AC | PRN

## 2018-07-01 MED ORDER — ALBUTEROL SULFATE HFA 108 (90 BASE) MCG/ACT IN AERS: 2.0000 | INHALATION_SPRAY | Freq: Four times a day (QID) | RESPIRATORY_TRACT | 0 refills | Status: DC | PRN

## 2018-07-01 MED ORDER — DOXYCYCLINE HYCLATE 100 MG PO TABS: 100.0000 mg | ORAL_TABLET | Freq: Two times a day (BID) | ORAL | 0 refills | Status: AC

## 2018-07-01 NOTE — Progress Notes (Signed)
ACUTE VISIT  HPI:  Chief Complaint  Patient presents with  . Sinusitis    cough with yellow/green phlem and nasal congestion that started Saturday    [name] is a 77 y.o.male here today complaining of 2-3 days of respiratory symptoms. Productive cough with yellowish/green sputum, denies hemoptysis. He is concerned about pneumonia.States that he always ends up having pneumonia. Former smoker. Wheezing,"not bad." SOB and chest thightness "sometimes" He has not identified exercerbating or alleviating factors.  Retro ocular pain with coughing spells.   URI   This is a new problem. The current episode started in the past 7 days. The problem has been gradually worsening. There has been no fever. Associated symptoms include congestion, coughing, rhinorrhea and wheezing. Pertinent negatives include no abdominal pain, chest pain, diarrhea, ear pain, headaches, nausea, plugged ear sensation, rash, sore throat or vomiting. The treatment provided no relief.    No Hx of recent travel. Daughter and grandchildren were sick recently. No known insect bite.  Hx of allergies:Allergic rhinitis,he is on Flonase nasal spray as needed.  OTC medications for this problem: Vit C  Review of Systems  Constitutional: Positive for activity change and fatigue. Negative for appetite change, chills and fever.  HENT: Positive for congestion, rhinorrhea and sinus pressure. Negative for ear pain, mouth sores, postnasal drip, sore throat, trouble swallowing and voice change.   Eyes: Negative for discharge, redness and itching.  Respiratory: Positive for cough, chest tightness, shortness of breath and wheezing.   Cardiovascular: Negative for chest pain and leg swelling.  Gastrointestinal: Negative for abdominal pain, diarrhea, nausea and vomiting.  Musculoskeletal: Positive for arthralgias and gait problem. Negative for joint swelling.  Skin: Negative for rash.  Allergic/Immunologic:  Positive for environmental allergies.  Neurological: Negative for syncope, weakness and headaches.      Current Outpatient Medications on File Prior to Visit  Medication Sig Dispense Refill  . apixaban (ELIQUIS) 5 MG TABS tablet Take 5 mg by mouth 2 (two) times daily.    . Ascorbic Acid (VITAMIN C PO) Take 4 tablets by mouth daily as needed (flu like symptopms).     [name] aspirin EC 81 MG tablet Take 81 mg by mouth daily.    . clonazePAM (KLONOPIN) 0.5 MG tablet Take 1 tablet (0.5 mg total) by mouth 3 (three) times daily as needed. 270 tablet 1  . diclofenac sodium (VOLTAREN) 1 % GEL Apply 1 application topically daily as needed (ankle pain). 5 Tube 10  . diltiazem (CARDIZEM CD) 120 MG 24 hr capsule TAKE 1 CAPSULE BY MOUTH EVERY DAY 90 capsule 3  . ezetimibe (ZETIA) 10 MG tablet Take 1 tablet (10 mg total) by mouth daily. 90 tablet 3  . fluticasone (FLONASE) 50 MCG/ACT nasal spray Place 1 spray into both nostrils daily as needed for allergies or rhinitis. 48 g 3  . furosemide (LASIX) 20 MG tablet Take 1 tablet (20 mg total) by mouth every other day. (Patient taking differently: Take 20 mg by mouth every other day. Takes as needed for edema.) 15 tablet 10  . gabapentin (NEURONTIN) 100 MG capsule TAKE 1 CAPSULE BY MOUTH THREE TIMES A DAY 270 capsule 1  . levothyroxine (SYNTHROID, LEVOTHROID) 175 MCG tablet TAKE 1 TABLET (175 MCG TOTAL) BY MOUTH DAILY BEFORE BREAKFAST. 90 tablet 2  . methocarbamol (ROBAXIN) 750 MG tablet Take 750 mg by mouth 4 (four) times daily as needed for muscle spasms.     . nitroGLYCERIN (NITROSTAT) 0.4 MG  SL tablet Place 1 tablet (0.4 mg total) under the tongue every 5 (five) minutes as needed for chest pain. 25 tablet 12  . omeprazole (PRILOSEC) 20 MG capsule TAKE 1 CAPSULE BY MOUTH TWICE A DAY 60 capsule 11  . [START ON 07/21/2018] oxyCODONE HCl 30 MG TABA Take 1 tablet by mouth every 6 (six) hours as needed (pain). 120 tablet 0  . polyethylene glycol (MIRALAX / GLYCOLAX)  packet Take 17 g by mouth daily as needed for mild constipation.    . ropinirole (REQUIP) 5 MG tablet TAKE 2 TABLETS (10 MG TOTAL) BY MOUTH AT BEDTIME. (Patient taking differently: Take 10 mg by mouth at bedtime. Patient takes 1 tablet at bedtime.  Two tablets gives him headaches.) 180 tablet 0   No current facility-administered medications on file prior to visit.      Past Medical History:  Diagnosis Date  . Adenomatous polyp of colon 2007  . Arthritis   . Atrial flutter ([place])   . AVM (arteriovenous malformation) 2011   a. S/p argon plasma coagulation and ablation in 2011, 2017.  . Bradycardia    a. H/o almost 7sec pause nocturnally during 2011 admission. Also has h/o fatigue with BB.  [name] CAD (coronary artery disease)    a. Nonobst disease by cath 2009. b. s/p PCI 10/2014 with DES to [place], patent by relook 11/2014 (Brilinta changed to Plavix with improved sx).  . Chronic diastolic CHF (congestive heart failure) ([place])   . Depression   . Diverticulosis   . Gastritis 2011  . GERD (gastroesophageal reflux disease)   . History of hiatal hernia   . Hyperlipidemia   . Hypertension   . Hypothyroidism   . Myocardial infarction ([place])   . Obstructive sleep apnea    -no cpap use  . Premature atrial contractions Holter 2016  . PVC's (premature ventricular contractions) Holter 2016  . Statin intolerance   . Transfusion history    several years ago -GI bleed   Allergies  Allergen Reactions  . Brilinta [Ticagrelor] Shortness Of Breath  . Metoprolol Tartrate Other (See Comments)    Severe chest pains " flat lined patient"  . Mirapex [Pramipexole Dihydrochloride]     Severe leg pain  . Shellfish Allergy Anaphylaxis and Hives  . Statins Other (See Comments)    All statins cause myalgias   . Zolpidem Other (See Comments)    Chest pain  . Levaquin [Levofloxacin] Hives  . Trazodone And Nefazodone Other (See Comments)    Unsteady on feet    Social History   Socioeconomic  History  . Marital status: Married    Spouse name: Not on file  . Number of children: 1  . Years of education: Not on file  . Highest education level: Not on file  Occupational History  . Occupation: Disabled    Fish farm manager: UNEMPLOYED  Social Needs  . Financial resource strain: Not on file  . Food insecurity:    Worry: Not on file    Inability: Not on file  . Transportation needs:    Medical: Not on file    Non-medical: Not on file  Tobacco Use  . Smoking status: Former Smoker    Packs/day: 2.00    Years: 50.00    Pack years: 100.00    Types: Cigarettes    Last attempt to quit: 07/23/2002    Years since quitting: 15.9  . Smokeless tobacco: Never Used  Substance and Sexual Activity  . Alcohol use: No  Alcohol/week: 0.0 standard drinks  . Drug use: No  . Sexual activity: Not on file  Lifestyle  . Physical activity:    Days per week: Not on file    Minutes per session: Not on file  . Stress: Not on file  Relationships  . Social connections:    Talks on phone: Not on file    Gets together: Not on file    Attends religious service: Not on file    Active member of club or organization: Not on file    Attends meetings of clubs or organizations: Not on file    Relationship status: Not on file  Other Topics Concern  . Not on file  Social History Narrative  . Not on file    Vitals:   07/01/18 1451  BP: 120/70  Pulse: 71  Resp: 16  Temp: 97.6 F (36.4 C)  SpO2: 94%   Body mass index is 31.92 kg/m.   Physical Exam  Nursing note and vitals reviewed. Constitutional: He is oriented to person, place, and time. He appears well-developed. He does not appear ill. No distress.  HENT:  Head: Atraumatic.  Right Ear: Tympanic membrane, external ear and ear canal normal.  Left Ear: Tympanic membrane, external ear and ear canal normal.  Nose: Rhinorrhea present. Right sinus exhibits no maxillary sinus tenderness and no frontal sinus tenderness. Left sinus exhibits no  maxillary sinus tenderness and no frontal sinus tenderness.  Mouth/Throat: Oropharynx is clear and moist and mucous membranes are normal.  Hearing aids. Postnasal drainage. Normal sinus transillumination. Nasal voice.   Eyes: Conjunctivae are normal.  Cardiovascular: Normal rate and regular rhythm.  No murmur heard. Respiratory: Effort normal. No stridor. No respiratory distress. He has no wheezes. He has no rhonchi. He has rales in the left lower field.  Lymphadenopathy:       Head (right side): No submandibular adenopathy present.       Head (left side): No submandibular adenopathy present.    He has no cervical adenopathy.  Neurological: He is alert and oriented to person, place, and time. He has normal strength.  Unstable gait assisted by a cane.  Skin: Skin is warm. No rash noted. No erythema.  Psychiatric: His mood appears anxious.  Well groomed, good eye contact.    ASSESSMENT AND PLAN:  Mr. Aidynn was seen today for sinusitis.  Diagnoses and all orders for this visit:  Respiratory tract infection Because fine rales on examination I am recommending abx treatments , even though problem can still be viral in etiology. Instructed about warning signs. Further recommendations according to CXR results. F/U with PCP in 10 days,before if needed.   Rales -     doxycycline (VIBRA-TABS) 100 MG tablet; Take 1 tablet (100 mg total) by mouth 2 (two) times daily for 7 days. -     DG Chest 2 View  Cough -     DG Chest 2 View  Nasal sinus congestion Clinical findings do not suggest sinusitis. Continue Flonase nasal spray daily as needed. Nasal saline irrigations several times per day and as needed.  Reactive airway disease that is not asthma I do not appreciate wheezing today. Albuterol inh 2 puff every 6 hours for a week then as needed for wheezing or shortness of breath.   -     DG Chest 2 View; Future -     albuterol (PROVENTIL HFA;VENTOLIN HFA) 108 (90 Base) MCG/ACT  inhaler; Inhale 2 puffs into the lungs every 6 (six) hours  as needed for wheezing or shortness of breath. -     DG Chest 2 View  Other orders -     benzonatate (TESSALON) 100 MG capsule; Take 2 capsules (200 mg total) by mouth 2 (two) times daily as needed for up to 10 days.     -Mr. [name] advised to seek attention immediately if symptoms worsen.     Chaundra Abreu G. [place], MD  [facility]. [place] office.

## 2018-07-01 NOTE — Patient Instructions (Addendum)
  [name] I have seen you today for an acute visit.  A few things to remember from today's visit:   Rales - Plan: DG Chest 2 View, doxycycline (VIBRA-TABS) 100 MG tablet  Cough - Plan: DG Chest 2 View  Nasal sinus congestion  Reactive airway disease that is not asthma - Plan: DG Chest 2 View, albuterol (PROVENTIL HFA;VENTOLIN HFA) 108 (90 Base) MCG/ACT inhaler  Albuterol inh 2 puff every 6 hours for a week then as needed for wheezing or shortness of breath.  Continue Flonase nasal spray. Nasal saline irrigations a few times during the day.  If medications prescribed today, they will not be refill upon request, a follow up appointment with PCP will be necessary to discuss continuation of of treatment if appropriate.     In general please monitor for signs of worsening symptoms and seek immediate medical attention if any concerning.    I hope you get better soon!

## 2018-07-02 ENCOUNTER — Ambulatory Visit (INDEPENDENT_AMBULATORY_CARE_PROVIDER_SITE_OTHER): Payer: PPO

## 2018-07-02 DIAGNOSIS — I495 Sick sinus syndrome: Secondary | ICD-10-CM | POA: Diagnosis not present

## 2018-07-03 ENCOUNTER — Telehealth: Payer: Self-pay

## 2018-07-03 NOTE — Telephone Encounter (Signed)
Pt would like the results of the cxr that was done yesterday.  Dr. Sarajane Jews please advise. Thanks

## 2018-07-03 NOTE — Telephone Encounter (Signed)
Copied from [place] [id number]. Topic: General - Other >> Jul 03, 2018  8:21 AM Leward Quan A wrote: Reason for CRM: Patient called to request a call back with the result of his chest Xrays done on 07/01/18. Ph# [phone]

## 2018-07-04 ENCOUNTER — Encounter: Payer: Self-pay | Admitting: Cardiology

## 2018-07-04 NOTE — Progress Notes (Signed)
Remote pacemaker transmission.   

## 2018-07-07 DIAGNOSIS — H2512 Age-related nuclear cataract, left eye: Secondary | ICD-10-CM | POA: Diagnosis not present

## 2018-07-07 DIAGNOSIS — H2511 Age-related nuclear cataract, right eye: Secondary | ICD-10-CM | POA: Diagnosis not present

## 2018-07-07 DIAGNOSIS — Z01818 Encounter for other preprocedural examination: Secondary | ICD-10-CM | POA: Diagnosis not present

## 2018-07-11 ENCOUNTER — Ambulatory Visit: Payer: PPO | Admitting: Family Medicine

## 2018-07-11 DIAGNOSIS — Z0289 Encounter for other administrative examinations: Secondary | ICD-10-CM

## 2018-07-30 DIAGNOSIS — H25811 Combined forms of age-related cataract, right eye: Secondary | ICD-10-CM | POA: Diagnosis not present

## 2018-07-30 DIAGNOSIS — H2513 Age-related nuclear cataract, bilateral: Secondary | ICD-10-CM | POA: Diagnosis not present

## 2018-07-30 DIAGNOSIS — H2511 Age-related nuclear cataract, right eye: Secondary | ICD-10-CM | POA: Diagnosis not present

## 2018-08-12 ENCOUNTER — Other Ambulatory Visit: Payer: Self-pay | Admitting: Family Medicine

## 2018-08-14 NOTE — Telephone Encounter (Signed)
Dr. Fry please advise on refill. Thanks  

## 2018-08-15 NOTE — Telephone Encounter (Signed)
Call in #[phone] with one rf 

## 2018-08-15 NOTE — Telephone Encounter (Signed)
See my note

## 2018-08-16 LAB — CUP PACEART REMOTE DEVICE CHECK
Battery Remaining Longevity: 162 mo
Battery Voltage: 3.05 V
Brady Statistic AP VS Percent: 17.05 %
Brady Statistic AS VP Percent: 2.25 %
Brady Statistic AS VS Percent: 79.31 %
Brady Statistic RA Percent Paced: 18.83 %
Brady Statistic RV Percent Paced: 3.64 %
Date Time Interrogation Session: 20191209234418
Implantable Lead Implant Date: 20180604
Implantable Lead Implant Date: 20180604
Implantable Lead Location: 753860
Implantable Lead Model: 5076
Implantable Lead Model: 5076
Implantable Pulse Generator Implant Date: 20180604
Lead Channel Impedance Value: 304 Ohm
Lead Channel Impedance Value: 380 Ohm
Lead Channel Impedance Value: 475 Ohm
Lead Channel Pacing Threshold Amplitude: 0.5 V
Lead Channel Pacing Threshold Amplitude: 0.75 V
Lead Channel Pacing Threshold Pulse Width: 0.4 ms
Lead Channel Pacing Threshold Pulse Width: 0.4 ms
Lead Channel Sensing Intrinsic Amplitude: 4 mV
Lead Channel Sensing Intrinsic Amplitude: 4 mV
Lead Channel Sensing Intrinsic Amplitude: 4.625 mV
Lead Channel Sensing Intrinsic Amplitude: 4.625 mV
Lead Channel Setting Pacing Amplitude: 2.5 V
Lead Channel Setting Pacing Pulse Width: 0.4 ms
Lead Channel Setting Sensing Sensitivity: 1.2 mV
MDC IDC LEAD LOCATION: 753859
MDC IDC MSMT LEADCHNL RV IMPEDANCE VALUE: 399 Ohm
MDC IDC SET LEADCHNL RA PACING AMPLITUDE: 1.5 V
MDC IDC STAT BRADY AP VP PERCENT: 1.39 %

## 2018-08-18 NOTE — Telephone Encounter (Signed)
Refill of medication has been called to the pharmacy and left on the VM.

## 2018-08-25 ENCOUNTER — Telehealth: Payer: Self-pay

## 2018-08-25 NOTE — Telephone Encounter (Signed)
[place] Date/Time Eilene Ghazi Time): 08/23/2018 12:47:06 PM Caller states office did not call in his Oxycodone, he has back issues and restless legs, he can't rest without it  Pt called [facility] over weekend stating his medication was not refilled.

## 2018-08-25 NOTE — Telephone Encounter (Signed)
We need to wait until the PMV

## 2018-08-25 NOTE — Telephone Encounter (Signed)
Dr. Sarajane Jews pt has a PMV appt tomorrow with you---he stated that his last refill of the oxy was not at the pharmacy.  Please advise. Thanks

## 2018-08-26 ENCOUNTER — Ambulatory Visit (INDEPENDENT_AMBULATORY_CARE_PROVIDER_SITE_OTHER): Payer: PPO | Admitting: Family Medicine

## 2018-08-26 ENCOUNTER — Encounter: Payer: Self-pay | Admitting: Family Medicine

## 2018-08-26 VITALS — BP 120/84 | HR 72 | Temp 97.8°F | Wt 225.5 lb

## 2018-08-26 DIAGNOSIS — F1190 Opioid use, unspecified, uncomplicated: Secondary | ICD-10-CM | POA: Diagnosis not present

## 2018-08-26 DIAGNOSIS — R1032 Left lower quadrant pain: Secondary | ICD-10-CM | POA: Diagnosis not present

## 2018-08-26 MED ORDER — METRONIDAZOLE 500 MG PO TABS: 500.0000 mg | ORAL_TABLET | Freq: Three times a day (TID) | ORAL | 0 refills | Status: DC

## 2018-08-26 MED ORDER — AMOXICILLIN-POT CLAVULANATE 875-125 MG PO TABS: 1.0000 | ORAL_TABLET | Freq: Two times a day (BID) | ORAL | 0 refills | Status: DC

## 2018-08-26 NOTE — Addendum Note (Signed)
Addended by: Elmer Picker on: 08/26/2018 11:43 AM   Modules accepted: Orders

## 2018-08-26 NOTE — Progress Notes (Signed)
   Subjective:    Patient ID: [name], male    DOB: 03-Mar-1941, 78 y.o.   MRN: [id number] Here for the onset early this am of moderate to severe pain in the LLQ of the abdomen. He has never felt this before. No fever or nausea. No urinary symptoms. He has had some diarrhea for several days. Of note he had sigmoid diverticulosis on his last colonoscopy.      Review of Systems  Constitutional: Negative.   Respiratory: Negative.   Cardiovascular: Negative.   Gastrointestinal: Positive for abdominal pain and diarrhea. Negative for abdominal distention, anal bleeding, blood in stool, constipation, nausea, rectal pain and vomiting.  Neurological: Negative.        Objective:   Physical Exam Constitutional:      Appearance: Normal appearance.  Cardiovascular:     Rate and Rhythm: Normal rate and regular rhythm.     Pulses: Normal pulses.     Heart sounds: Normal heart sounds.  Pulmonary:     Effort: Pulmonary effort is normal.     Breath sounds: Normal breath sounds.  Abdominal:     General: Abdomen is flat. Bowel sounds are normal. There is no distension.     Palpations: Abdomen is soft. There is no mass.     Tenderness: There is no guarding or rebound.     Hernia: No hernia is present.     Comments: Quite tender in the LLQ  Neurological:     General: No focal deficit present.     Mental Status: He is alert and oriented to person, place, and time.           Assessment & Plan:  Diverticulitis, treat with Augmentin and Flagyl. Recheck a a few days.  [name], MD

## 2018-08-27 ENCOUNTER — Telehealth: Payer: Self-pay | Admitting: Family Medicine

## 2018-08-27 ENCOUNTER — Ambulatory Visit (INDEPENDENT_AMBULATORY_CARE_PROVIDER_SITE_OTHER): Payer: PPO | Admitting: Family Medicine

## 2018-08-27 ENCOUNTER — Encounter: Payer: Self-pay | Admitting: Family Medicine

## 2018-08-27 ENCOUNTER — Ambulatory Visit: Payer: PPO | Admitting: Family Medicine

## 2018-08-27 VITALS — BP 128/70 | HR 69 | Temp 97.8°F | Wt 225.5 lb

## 2018-08-27 DIAGNOSIS — M5440 Lumbago with sciatica, unspecified side: Secondary | ICD-10-CM | POA: Diagnosis not present

## 2018-08-27 DIAGNOSIS — F1190 Opioid use, unspecified, uncomplicated: Secondary | ICD-10-CM | POA: Diagnosis not present

## 2018-08-27 DIAGNOSIS — R1032 Left lower quadrant pain: Secondary | ICD-10-CM

## 2018-08-27 LAB — BASIC METABOLIC PANEL
BUN: 14 mg/dL (ref 6–23)
CO2: 25 mEq/L (ref 19–32)
Calcium: 9 mg/dL (ref 8.4–10.5)
Chloride: 106 mEq/L (ref 96–112)
Creatinine, Ser: 1.11 mg/dL (ref 0.40–1.50)
GFR: 64.26 mL/min (ref >60.00)
Glucose, Bld: 120 mg/dL — ABNORMAL HIGH (ref 70–99)
Potassium: 4.2 mEq/L (ref 3.5–5.1)
Sodium: 141 mEq/L (ref 135–145)

## 2018-08-27 LAB — CBC WITH DIFFERENTIAL/PLATELET
Basophils Absolute: 0.1 10*3/uL (ref 0.0–0.1)
Basophils Relative: 1 % (ref 0.0–3.0)
EOS ABS: 0.6 10*3/uL (ref 0.0–0.7)
Eosinophils Relative: 5.3 % — ABNORMAL HIGH (ref 0.0–5.0)
HCT: 44.6 % (ref 39.0–52.0)
Hemoglobin: 14.5 g/dL (ref 13.0–17.0)
Lymphocytes Relative: 21.3 % (ref 12.0–46.0)
Lymphs Abs: 2.2 10*3/uL (ref 0.7–4.0)
MCHC: 32.4 g/dL (ref 30.0–36.0)
MCV: 87.6 fl (ref 78.0–100.0)
MONO ABS: 0.9 10*3/uL (ref 0.1–1.0)
Monocytes Relative: 8.5 % (ref 3.0–12.0)
Neutro Abs: 6.7 10*3/uL (ref 1.4–7.7)
Neutrophils Relative %: 63.9 % (ref 43.0–77.0)
PLATELETS: 293 10*3/uL (ref 150.0–400.0)
RBC: 5.09 Mil/uL (ref 4.22–5.81)
RDW: 15.8 % — ABNORMAL HIGH (ref 11.5–15.5)
WBC: 10.4 10*3/uL (ref 4.0–10.5)

## 2018-08-27 MED ORDER — OXYCODONE HCL 30 MG PO TABS: 30.0000 mg | ORAL_TABLET | Freq: Four times a day (QID) | ORAL | 0 refills | Status: DC | PRN

## 2018-08-27 MED ORDER — CEFTRIAXONE SODIUM 1 G IJ SOLR
1.0000 g | Freq: Once | INTRAMUSCULAR | Status: AC
  Administered 2018-08-27: 1 g via INTRAMUSCULAR

## 2018-08-27 NOTE — Telephone Encounter (Signed)
Dr. Fry please advise on refill of medication.  Thanks  

## 2018-08-27 NOTE — Telephone Encounter (Signed)
Called and spoke with pt and he is aware of rx that have been sent to the pharmacy.

## 2018-08-27 NOTE — Telephone Encounter (Signed)
Copied from [place] [id number]. Topic: Quick Communication - Rx Refill/Question >> Aug 27, 2018  1:21 PM Margot Ables wrote: Medication: oxycodone (ROXICODONE) 30 MG immediate release tablet - pt went to pharmacy and medication was not sent in and he states Dr. Sarajane Jews knows he needs it bad - please advise.  Has the patient contacted their pharmacy? {yes Preferred Pharmacy (with phone number or street name): [id number] - [place], [place] - [address] [phone] (Phone) [phone] (Fax)

## 2018-08-27 NOTE — Telephone Encounter (Signed)
I called the pt to give him information about his scheduled CT. Pt stated that he is awaiting an RX for oxycodone (ROXICODONE) 30 MG immediate-release tablet.    Pt states he called his pharmacy,  and they have no record of it please advise.  pt states he is pain       Pharmacies    [id number] - [place], [place] - [address] [phone] (Phone) [phone] (Fax)

## 2018-08-27 NOTE — Telephone Encounter (Signed)
These were sent in  

## 2018-08-27 NOTE — Telephone Encounter (Signed)
Duplicate message.  Previous message has been sent to Dr. Sarajane Jews for refill.

## 2018-08-27 NOTE — Progress Notes (Signed)
   Subjective:    Patient ID: [name], male    DOB: 1941-03-24, 78 y.o.   MRN: [id number]  HPI Here for pain management. He has been doing well as far as that goes. He was seen here also yesterday for LLQ pain that was felt to be from diverticulitis. He was started on Augmentin and Flagyl, and he took one dose of each last night. Today he feels a little worse than yesterday with more LLQ pain. Still no fever or nausea or vomiting. He has light diarrhea.  Indication for chronic opioid: low back pain Medication and dose: Oxycodone 30 mg  # pills per month: 120 Last UDS date: 09-02-17 Opioid Treatment Agreement signed (Y/N): 12-17-17 Opioid Treatment Agreement last reviewed with patient:  08-27-18 [facility] reviewed this encounter (include red flags):  08-27-18    Review of Systems  Constitutional: Negative.   Respiratory: Negative.   Cardiovascular: Negative.   Gastrointestinal: Positive for abdominal pain and diarrhea. Negative for abdominal distention, anal bleeding, blood in stool, constipation, nausea, rectal pain and vomiting.  Genitourinary: Negative.   Musculoskeletal: Positive for back pain.  Neurological: Negative.        Objective:   Physical Exam Constitutional:      Comments: In pain   Cardiovascular:     Rate and Rhythm: Normal rate and regular rhythm.     Pulses: Normal pulses.     Heart sounds: Normal heart sounds.  Pulmonary:     Effort: Pulmonary effort is normal.     Breath sounds: Normal breath sounds.  Abdominal:     General: Abdomen is flat. Bowel sounds are normal. There is no distension.     Palpations: Abdomen is soft. There is no mass.     Tenderness: There is no guarding or rebound.     Hernia: No hernia is present.     Comments: He is quite tender in the LLQ   Neurological:     General: No focal deficit present.     Mental Status: He is alert and oriented to person, place, and time.           Assessment & Plan:  For pain management, his  meds were refilled. For the LLQ pain, I still believe this diverticulitis. We will give him a shot of Rocephin today, and he will continue to Augmentin and Flagyl. Get labs today to include a UA and CBC. Set up a contrasted abdominal CT asap.  [name], MD

## 2018-08-30 LAB — PAIN MGMT, PROFILE 8 W/CONF, U
6 Acetylmorphine: NEGATIVE ng/mL (ref <10)
Alcohol Metabolites: NEGATIVE ng/mL (ref <500)
Amphetamines: NEGATIVE ng/mL (ref <500)
Benzodiazepines: NEGATIVE ng/mL (ref <100)
Buprenorphine, Urine: NEGATIVE ng/mL (ref <5)
Buprenorphine: NEGATIVE ng/mL (ref <2)
Cocaine Metabolite: NEGATIVE ng/mL (ref <150)
Codeine: NEGATIVE ng/mL (ref <50)
Creatinine: 184.6 mg/dL
HYDROCODONE: NEGATIVE ng/mL (ref <50)
Hydromorphone: NEGATIVE ng/mL (ref <50)
MDMA: NEGATIVE ng/mL (ref <500)
Marijuana Metabolite: NEGATIVE ng/mL (ref <20)
Morphine: NEGATIVE ng/mL (ref <50)
Norbuprenorphine: NEGATIVE ng/mL (ref <2)
Norhydrocodone: NEGATIVE ng/mL (ref <50)
Noroxycodone: 3230 ng/mL — ABNORMAL HIGH (ref <50)
Opiates: NEGATIVE ng/mL (ref <100)
Oxidant: NEGATIVE ug/mL (ref <200)
Oxycodone: 4799 ng/mL — ABNORMAL HIGH (ref <50)
Oxycodone: POSITIVE ng/mL — AB (ref <100)
Oxymorphone: 3186 ng/mL — ABNORMAL HIGH (ref <50)
pH: 6.14 (ref 4.5–9.0)

## 2018-09-01 ENCOUNTER — Ambulatory Visit (INDEPENDENT_AMBULATORY_CARE_PROVIDER_SITE_OTHER)
Admission: RE | Admit: 2018-09-01 | Discharge: 2018-09-01 | Disposition: A | Discharge status: Home / Self Care | Payer: PPO | Source: Ambulatory Visit | Attending: Family Medicine | Admitting: Family Medicine

## 2018-09-01 DIAGNOSIS — K5730 Diverticulosis of large intestine without perforation or abscess without bleeding: Secondary | ICD-10-CM | POA: Diagnosis not present

## 2018-09-01 DIAGNOSIS — K4090 Unilateral inguinal hernia, without obstruction or gangrene, not specified as recurrent: Secondary | ICD-10-CM | POA: Diagnosis not present

## 2018-09-01 DIAGNOSIS — R1032 Left lower quadrant pain: Secondary | ICD-10-CM | POA: Diagnosis not present

## 2018-09-01 MED ORDER — IOPAMIDOL (ISOVUE-300) INJECTION 61%
100.0000 mL | Freq: Once | INTRAVENOUS | Status: AC | PRN
  Administered 2018-09-01: 100 mL via INTRAVENOUS

## 2018-09-17 ENCOUNTER — Other Ambulatory Visit: Payer: Self-pay | Admitting: Family Medicine

## 2018-09-22 ENCOUNTER — Telehealth: Payer: Self-pay | Admitting: Family Medicine

## 2018-09-22 NOTE — Telephone Encounter (Signed)
Direction / instruction left at fron office for pick up pt aware Pt scheduled 09/01/2018@2 :30 pm  [place], [place] Address: [address], [place], [place] [address] Phone: [phone]    Copied from [place]. Topic: General - Other >> Aug 29, 2018 11:10 AM Yvette Rack wrote: Reason for CRM: Pt stated that he was advised that he has to drink a solution before the CT scan but he does not know where he has to get the solution from. Pt requests call back. Cb# [phone]

## 2018-10-01 ENCOUNTER — Ambulatory Visit (INDEPENDENT_AMBULATORY_CARE_PROVIDER_SITE_OTHER): Payer: PPO | Admitting: *Deleted

## 2018-10-01 DIAGNOSIS — I495 Sick sinus syndrome: Secondary | ICD-10-CM | POA: Diagnosis not present

## 2018-10-01 DIAGNOSIS — I5032 Chronic diastolic (congestive) heart failure: Secondary | ICD-10-CM

## 2018-10-02 LAB — CUP PACEART REMOTE DEVICE CHECK
Battery Remaining Longevity: 158 mo
Battery Voltage: 3.04 V
Brady Statistic AP VP Percent: 2.78 %
Brady Statistic AP VS Percent: 19.73 %
Brady Statistic RA Percent Paced: 23.39 %
Brady Statistic RV Percent Paced: 7.89 %
Date Time Interrogation Session: 20200311071205
Implantable Lead Implant Date: 20180604
Implantable Lead Location: 753859
Implantable Lead Location: 753860
Implantable Lead Model: 5076
Implantable Lead Model: 5076
Implantable Pulse Generator Implant Date: 20180604
Lead Channel Impedance Value: 304 Ohm
Lead Channel Impedance Value: 380 Ohm
Lead Channel Impedance Value: 437 Ohm
Lead Channel Pacing Threshold Amplitude: 0.875 V
Lead Channel Pacing Threshold Pulse Width: 0.4 ms
Lead Channel Pacing Threshold Pulse Width: 0.4 ms
Lead Channel Sensing Intrinsic Amplitude: 1.875 mV
Lead Channel Sensing Intrinsic Amplitude: 1.875 mV
Lead Channel Sensing Intrinsic Amplitude: 4 mV
Lead Channel Sensing Intrinsic Amplitude: 4 mV
Lead Channel Setting Pacing Amplitude: 2 V
Lead Channel Setting Pacing Amplitude: 2.5 V
Lead Channel Setting Pacing Pulse Width: 0.4 ms
Lead Channel Setting Sensing Sensitivity: 1.2 mV
MDC IDC LEAD IMPLANT DT: 20180604
MDC IDC MSMT LEADCHNL RA IMPEDANCE VALUE: 513 Ohm
MDC IDC MSMT LEADCHNL RV PACING THRESHOLD AMPLITUDE: 0.75 V
MDC IDC STAT BRADY AS VP PERCENT: 5.11 %
MDC IDC STAT BRADY AS VS PERCENT: 72.38 %

## 2018-10-08 NOTE — Progress Notes (Signed)
Remote pacemaker transmission.   

## 2018-10-10 ENCOUNTER — Telehealth: Payer: Self-pay | Admitting: Cardiovascular Disease

## 2018-10-10 NOTE — Telephone Encounter (Signed)
Spoke with patient and he stated that he is unable to afford the eliquis and he was told that we would be able to supply him with samples. I made him aware that we are unable to do that as samples are intended for new starts or patients applying for patient assistance. He stated that he makes about $5 to much to qualify for patient assistance and he refuses to take rat poison as he is not a rat. I made him aware that I could provide him with one week of samples so that he is not without medication and that I would send a note in regards to our conversation.

## 2018-10-10 NOTE — Telephone Encounter (Signed)
°  Patient calling the office for samples of medication: ° ° °1.  What medication and dosage are you requesting samples for? °Eliquis  °2.  Are you currently out of this medication?  °Yes  ° ° °

## 2018-10-13 NOTE — Telephone Encounter (Signed)
Patient care fund may be able to provide assistance. Sent staff message to J. Tobe Sos for advice/recommendations.

## 2018-10-14 ENCOUNTER — Telehealth (HOSPITAL_COMMUNITY): Payer: Self-pay | Admitting: Licensed Clinical Social Worker

## 2018-10-14 NOTE — Telephone Encounter (Signed)
CSW received referral from [facility] office requesting assistance with Eliquis. CSW contacted patient who states he was denied for Eliquis patient assistance program and very frustrated with inability to get medications. Patient reports he lives with his wife, daughter and 3 grandchildren. He states he is the only one who has any income which is apprx. $2,200 monthly. His daughter moved in with her 3 children a few months back and has been out of work. Wife is on 77yo and suffering from lung cancer. He states they both have multiple medications with little assistance for payments.   CSW contacted Eliquis Patient Assistance program and informed that patient has not applied for assistance since May, 2019. At that time, he has a $400 deductible to meet prior to being eligible for assistance.  CSW returned call to patient to inform and he states he did not understand and thought he was denied. CSW discussed need for application for Eliquis assistance program although patient reports he has no access to email for CSW to send application. Patient will need some samples and plans to go to [employer]. CSW will explore ability to have application at [facility] and completion during his time at the office picking up samples. CSW will return call to patient once plan confirmed with staff at [facility]. Raquel Sarna, [place], [place]

## 2018-10-14 NOTE — Telephone Encounter (Signed)
CSW arranged for patient to pick up week supply of samples and complete Eliquis application with [employer] [facility]. CSW spoke with patient and explained needed items for application (income verification and pharmacy out of pocket expense report). Patient instructed to pick up application and samples at the [facility] street office tomorrow morning. CSW also encouraged patient to complete a food stamp application for food assistance through the county. Patient grateful and verbalizes understanding of follow up. CSW will continue to be available for follow up as needed. Raquel Sarna, [place], [place]

## 2018-10-14 NOTE — Telephone Encounter (Signed)
Follow Up:     Kennyth Lose, Social Worker for [employer] [facility],asked to you . She said she had some information for you, concerning the patient.

## 2018-10-14 NOTE — Telephone Encounter (Signed)
I spoke with [name] at [facility]. She has instructed patient to come to [employer] office tomorrow to fill out patient assistance forms for Eliquis.  Dr. [name] is scheduled to be in the office Thursday to sign them.  We have left the patient 3 weeks worth of Eliquis samples at this front desk for him as well.

## 2018-10-20 NOTE — Telephone Encounter (Signed)
Received all documents needed for Eliquis Pt Asst application. Faxed the completed forms to [employer] today.

## 2018-10-27 NOTE — Telephone Encounter (Signed)
Received notification from [employer] stating that he is not eligible to receive Eliquis for the following reason:  Product covered by insurance

## 2018-10-29 ENCOUNTER — Telehealth (HOSPITAL_COMMUNITY): Payer: Self-pay | Admitting: Licensed Clinical Social Worker

## 2018-10-29 ENCOUNTER — Other Ambulatory Visit: Payer: Self-pay

## 2018-10-29 ENCOUNTER — Ambulatory Visit (INDEPENDENT_AMBULATORY_CARE_PROVIDER_SITE_OTHER): Payer: PPO | Admitting: Family Medicine

## 2018-10-29 ENCOUNTER — Encounter: Payer: Self-pay | Admitting: Family Medicine

## 2018-10-29 DIAGNOSIS — M5440 Lumbago with sciatica, unspecified side: Secondary | ICD-10-CM | POA: Diagnosis not present

## 2018-10-29 NOTE — Progress Notes (Signed)
Subjective:    Patient ID: [name], male    DOB: 12-04-1940, 78 y.o.   MRN: [id number]  HPI Virtual Visit via Video Note  I connected with the patient on 10/29/18 at  2:45 PM EDT by a video enabled telemedicine application and verified that I am speaking with the correct person using two identifiers.  Location patient: home Location provider:work or home office Persons participating in the virtual visit: patient, provider  I discussed the limitations of evaluation and management by telemedicine and the availability of in person appointments. The patient expressed understanding and agreed to proceed.   HPI: For the past week his low back pain has been worse than ever. He has pain and weakness in both legs. It is very hard for him to get in or out of bed or his car. Using Oxycodone.    ROS: See pertinent positives and negatives per HPI.  Past Medical History:  Diagnosis Date  . Adenomatous polyp of colon 2007  . Arthritis   . Atrial flutter ([place])   . AVM (arteriovenous malformation) 2011   a. S/p argon plasma coagulation and ablation in 2011, 2017.  . Bradycardia    a. H/o almost 7sec pause nocturnally during 2011 admission. Also has h/o fatigue with BB.  [name] CAD (coronary artery disease)    a. Nonobst disease by cath 2009. b. s/p PCI 10/2014 with DES to [place], patent by relook 11/2014 (Brilinta changed to Plavix with improved sx).  . Chronic diastolic CHF (congestive heart failure) ([place])   . Depression   . Diverticulosis   . Gastritis 2011  . GERD (gastroesophageal reflux disease)   . History of hiatal hernia   . Hyperlipidemia   . Hypertension   . Hypothyroidism   . Myocardial infarction ([place])   . Obstructive sleep apnea    -no cpap use  . Premature atrial contractions Holter 2016  . PVC's (premature ventricular contractions) Holter 2016  . Statin intolerance   . Transfusion history    several years ago -GI bleed    Past Surgical History:  Procedure  Laterality Date  . ANKLE SURGERY Left   . APPENDECTOMY    . CARDIAC CATHETERIZATION N/A 11/26/2014   Procedure: Left Heart Cath and Coronary Angiography;  Surgeon: Burnell Blanks, MD;  Location: [facility];  Service: Cardiovascular;  Laterality: N/A;  . CHOLECYSTECTOMY N/A 08/23/2016   Procedure: LAPAROSCOPIC CHOLECYSTECTOMY;  Surgeon: Coralie Keens, MD;  Location: [facility];  Service: General;  Laterality: N/A;  . COLONOSCOPY  08-23-05   per Dr. Deatra Ina, adenomatous polyps, repeat in 5 yrs   . COLONOSCOPY WITH PROPOFOL N/A 12/06/2015   Procedure: COLONOSCOPY WITH PROPOFOL;  Surgeon: Doran Stabler, MD;  Location: [facility];  Service: Gastroenterology;  Laterality: N/A;  . ENTEROSCOPY N/A 12/06/2015   Procedure: ENTEROSCOPY;  Surgeon: Doran Stabler, MD;  Location: [facility];  Service: Gastroenterology;  Laterality: N/A;  . ESOPHAGOGASTRODUODENOSCOPY  08-23-05   per Dr. Deatra Ina, cauterized jejunal AVMs   . HERNIA REPAIR    . HOT HEMOSTASIS N/A 12/06/2015   Procedure: HOT HEMOSTASIS (ARGON PLASMA COAGULATION/BICAP);  Surgeon: Doran Stabler, MD;  Location: [facility];  Service: Gastroenterology;  Laterality: N/A;  . LEFT HEART CATHETERIZATION WITH CORONARY ANGIOGRAM N/A 09/08/2012   Procedure: LEFT HEART CATHETERIZATION WITH CORONARY ANGIOGRAM;  Surgeon: Burnell Blanks, MD;  Location: [facility];  Service: Cardiovascular;  Laterality: N/A;  . LEFT HEART CATHETERIZATION WITH CORONARY  ANGIOGRAM N/A 11/16/2014   Procedure: LEFT HEART CATHETERIZATION WITH CORONARY ANGIOGRAM;  Surgeon: Leonie Man, MD;  Location: [facility] C[facility];  Service: Cardiovascular;  Laterality: N/A;  . PACEMAKER IMPLANT N/A 12/24/2016   Procedure: Pacemaker Implant;  Surgeon: Deboraha Sprang, MD;  Location: [place] [facility];  Service: Cardiovascular;  Laterality: N/A;  . SKIN GRAFT Right    leg  . TONSILLECTOMY      Family History  Problem Relation Age of Onset  . Leukemia Mother   . Heart  disease Father   . Stroke Father   . Heart attack Father   . Alcoholism Paternal Uncle   . Alcoholism Maternal Grandfather   . Colon cancer Neg Hx   . Esophageal cancer Neg Hx      Current Outpatient Medications:  .  albuterol (PROVENTIL HFA;VENTOLIN HFA) 108 (90 Base) MCG/ACT inhaler, Inhale 2 puffs into the lungs every 6 (six) hours as needed for wheezing or shortness of breath., Disp: 1 Inhaler, Rfl: 0 .  Ascorbic Acid (VITAMIN C PO), Take 4 tablets by mouth daily as needed (flu like symptopms). , Disp: , Rfl:  .  aspirin EC 81 MG tablet, Take 81 mg by mouth daily., Disp: , Rfl:  .  clonazePAM (KLONOPIN) 0.5 MG tablet, TAKE 1 TABLET BY MOUTH 3 TIMES A DAY AS NEEDED, Disp: 270 tablet, Rfl: 1 .  diclofenac sodium (VOLTAREN) 1 % GEL, Apply 1 application topically daily as needed (ankle pain)., Disp: 5 Tube, Rfl: 10 .  diltiazem (CARDIZEM CD) 120 MG 24 hr capsule, TAKE 1 CAPSULE BY MOUTH EVERY DAY, Disp: 90 capsule, Rfl: 3 .  ezetimibe (ZETIA) 10 MG tablet, Take 1 tablet (10 mg total) by mouth daily., Disp: 90 tablet, Rfl: 3 .  fluticasone (FLONASE) 50 MCG/ACT nasal spray, Place 1 spray into both nostrils daily as needed for allergies or rhinitis., Disp: 48 g, Rfl: 3 .  furosemide (LASIX) 20 MG tablet, Take 1 tablet (20 mg total) by mouth every other day. (Patient taking differently: Take 20 mg by mouth every other day. Takes as needed for edema.), Disp: 15 tablet, Rfl: 10 .  gabapentin (NEURONTIN) 100 MG capsule, TAKE 1 CAPSULE BY MOUTH THREE TIMES A DAY, Disp: 270 capsule, Rfl: 1 .  levothyroxine (SYNTHROID, LEVOTHROID) 175 MCG tablet, TAKE 1 TABLET (175 MCG TOTAL) BY MOUTH DAILY BEFORE BREAKFAST., Disp: 90 tablet, Rfl: 2 .  methocarbamol (ROBAXIN) 750 MG tablet, Take 750 mg by mouth 4 (four) times daily as needed for muscle spasms. , Disp: , Rfl:  .  metroNIDAZOLE (FLAGYL) 500 MG tablet, Take 1 tablet (500 mg total) by mouth 3 (three) times daily., Disp: 30 tablet, Rfl: 0 .  nitroGLYCERIN  (NITROSTAT) 0.4 MG SL tablet, Place 1 tablet (0.4 mg total) under the tongue every 5 (five) minutes as needed for chest pain., Disp: 25 tablet, Rfl: 12 .  omeprazole (PRILOSEC) 20 MG capsule, TAKE 1 CAPSULE BY MOUTH TWICE A DAY, Disp: 60 capsule, Rfl: 11 .  oxycodone (ROXICODONE) 30 MG immediate release tablet, Take 1 tablet (30 mg total) by mouth every 6 (six) hours as needed for up to 30 days for pain., Disp: 120 tablet, Rfl: 0 .  polyethylene glycol (MIRALAX / GLYCOLAX) packet, Take 17 g by mouth daily as needed for mild constipation., Disp: , Rfl:  .  ropinirole (REQUIP) 5 MG tablet, TAKE 2 TABLETS (10 MG TOTAL) BY MOUTH AT BEDTIME. (Patient taking differently: Take 10 mg by mouth at bedtime. Patient  takes 1 tablet at bedtime.  Two tablets gives him headaches.), Disp: 180 tablet, Rfl: 0 .  apixaban (ELIQUIS) 5 MG TABS tablet, Take 5 mg by mouth 2 (two) times daily., Disp: , Rfl:   EXAM:  VITALS per patient if applicable:  GENERAL: alert, oriented, appears well and in no acute distress  HEENT: atraumatic, conjunttiva clear, no obvious abnormalities on inspection of external nose and ears  NECK: normal movements of the head and neck  LUNGS: on inspection no signs of respiratory distress, breathing rate appears normal, no obvious gross SOB, gasping or wheezing  CV: no obvious cyanosis  MS: moves all visible extremities without noticeable abnormality  PSYCH/NEURO: pleasant and cooperative, no obvious depression or anxiety, speech and thought processing grossly intact  ASSESSMENT AND PLAN: Back pain, I suggested he add Tylenol 1000 mg BID to his regimen. Recheck prn.  [name], MD  Discussed the following assessment and plan:  No diagnosis found.     I discussed the assessment and treatment plan with the patient. The patient was provided an opportunity to ask questions and all were answered. The patient agreed with the plan and demonstrated an understanding of the instructions.    The patient was advised to call back or seek an in-person evaluation if the symptoms worsen or if the condition fails to improve as anticipated.     Review of Systems     Objective:   Physical Exam        Assessment & Plan:

## 2018-10-29 NOTE — Telephone Encounter (Signed)
CSW received call from patient stating he received letter of denial from [employer] patient assistance. CSW contacted [employer] and informed that patient has not yet met the out of pocket deductible of $617.01. [facility] itemized list for both he and his wife to re fax to [employer]. Patient verbalizes understanding of follow up needed and will return call to CSW if needed. Raquel Sarna, [place], [place]

## 2018-11-05 ENCOUNTER — Other Ambulatory Visit: Payer: Self-pay | Admitting: Cardiovascular Disease

## 2018-11-24 ENCOUNTER — Telehealth: Payer: Self-pay | Admitting: Family Medicine

## 2018-11-24 NOTE — Telephone Encounter (Signed)
Copied from [place] [id number]. Topic: Quick Communication - Rx Refill/Question >> Nov 24, 2018  8:20 AM Leward Quan A wrote: Medication: oxycodone (ROXICODONE) 30 MG immediate release tablet   Has the patient contacted their pharmacy? Yes.   (Agent: If no, request that the patient contact the pharmacy for the refill.) (Agent: If yes, when and what did the pharmacy advise?)  Preferred Pharmacy (with phone number or street name): [id number] [place], [place] - [address] [phone] (Phone) [phone] (Fax)    Agent: Please be advised that RX refills may take up to 3 business days. We ask that you follow-up with your pharmacy.

## 2018-11-25 ENCOUNTER — Ambulatory Visit (INDEPENDENT_AMBULATORY_CARE_PROVIDER_SITE_OTHER): Payer: PPO | Admitting: Family Medicine

## 2018-11-25 ENCOUNTER — Encounter: Payer: Self-pay | Admitting: Family Medicine

## 2018-11-25 ENCOUNTER — Other Ambulatory Visit: Payer: Self-pay

## 2018-11-25 DIAGNOSIS — F1190 Opioid use, unspecified, uncomplicated: Secondary | ICD-10-CM | POA: Diagnosis not present

## 2018-11-25 DIAGNOSIS — M5440 Lumbago with sciatica, unspecified side: Secondary | ICD-10-CM | POA: Diagnosis not present

## 2018-11-25 MED ORDER — OXYCODONE HCL 30 MG PO TABS: 30.0000 mg | ORAL_TABLET | Freq: Four times a day (QID) | ORAL | 0 refills | Status: DC | PRN

## 2018-11-25 MED ORDER — OXYCODONE HCL 30 MG PO TABS: 30.0000 mg | ORAL_TABLET | Freq: Four times a day (QID) | ORAL | 0 refills | Status: AC | PRN

## 2018-11-25 NOTE — Telephone Encounter (Signed)
Pt will need PMV for refills.

## 2018-11-25 NOTE — Progress Notes (Signed)
   Subjective:    Patient ID: [name], male    DOB: 04/03/1941, 78 y.o.   MRN: [id number]  HPI Virtual Visit via Telephone Note  I connected with the patient on 11/25/18 at 11:15 AM EDT by telephone and verified that I am speaking with the correct person using two identifiers. We attempted to connect with [email or web address] but we had technical difficulties with audio and video.    I discussed the limitations, risks, security and privacy concerns of performing an evaluation and management service by telephone and the availability of in person appointments. I also discussed with the patient that there may be a patient responsible charge related to this service. The patient expressed understanding and agreed to proceed.  Location patient: home Location provider: work or home office Participants present for the call: patient, provider Patient did not have a visit in the prior 7 days to address this/these issue(s).   History of Present Illness: Here for pain management. He has been doing well.  Indication for chronic opioid: low back pain Medication and dose: Oxycodone 30 mg # pills per month: 120 Last UDS date: 08-26-18 Opioid Treatment Agreement signed (Y/N): 08-26-17 Opioid Treatment Agreement last reviewed with patient:  11-25-18 N[facility] reviewed this encounter (include red flags):  11-25-18    Observations/Objective: Patient sounds cheerful and well on the phone. I do not appreciate any SOB. Speech and thought processing are grossly intact. Patient reported vitals:  Assessment and Plan: Pain management, meds were refilled.  [name], MD   Follow Up Instructions:     [phone] 11-20 [phone] 21-30 I did not refer this patient for an OV in the next 24 hours for this/these issue(s).  I discussed the assessment and treatment plan with the patient. The patient was provided an opportunity to ask questions and all were answered. The patient agreed with the plan and demonstrated  an understanding of the instructions.   The patient was advised to call back or seek an in-person evaluation if the symptoms worsen or if the condition fails to improve as anticipated.  I provided 12 minutes of non-face-to-face time during this encounter.   [name], MD    Review of Systems     Objective:   Physical Exam        Assessment & Plan:

## 2018-11-27 ENCOUNTER — Other Ambulatory Visit: Payer: Self-pay | Admitting: Family Medicine

## 2018-11-27 NOTE — Telephone Encounter (Signed)
Copied from [place] [id number]. Topic: Quick Communication - Rx Refill/Question >> Nov 27, 2018  9:16 AM [name] wrote: Medication: ropinirole (REQUIP) 5 MG tablet   Has the patient contacted their pharmacy? Yes/request sent - Pt didn't realize he is on his last pill   Preferred Pharmacy (with phone number or street name): [id number] [place], [place] - [address] [phone] (Phone) [phone] (Fax)    Agent: Please be advised that RX refills may take up to 3 business days. We ask that you follow-up with your pharmacy.

## 2018-11-27 NOTE — Telephone Encounter (Signed)
Dr. Fry please advise 

## 2018-11-28 ENCOUNTER — Telehealth: Payer: Self-pay | Admitting: Family Medicine

## 2018-11-28 NOTE — Telephone Encounter (Signed)
Copied from [place] [id number]. Topic: Quick Communication - Rx Refill/Question >> Nov 28, 2018 11:12 AM Percell Belt A wrote: Medication: ropinirole (REQUIP) 5 MG tablet [[id number]]- pt stated that he is completely out and hasnt sleep good in a couple nights and he legs have been really bad   Has the patient contacted their pharmacy? No. (Agent: If no, request that the patient contact the pharmacy for the refill.) (Agent: If yes, when and what did the pharmacy advise?)  Preferred Pharmacy (with phone number or street name): [id number] [place], [place] - [address] [phone] (Phone)   Agent: Please be advised that RX refills may take up to 3 business days. We ask that you follow-up with your pharmacy.

## 2018-12-01 NOTE — Telephone Encounter (Signed)
Refill was sent in today.

## 2018-12-02 ENCOUNTER — Encounter: Payer: Self-pay | Admitting: Family Medicine

## 2018-12-02 ENCOUNTER — Ambulatory Visit: Payer: Self-pay | Admitting: *Deleted

## 2018-12-02 ENCOUNTER — Ambulatory Visit (INDEPENDENT_AMBULATORY_CARE_PROVIDER_SITE_OTHER): Payer: PPO | Admitting: Family Medicine

## 2018-12-02 ENCOUNTER — Other Ambulatory Visit: Payer: Self-pay

## 2018-12-02 DIAGNOSIS — Z209 Contact with and (suspected) exposure to unspecified communicable disease: Secondary | ICD-10-CM

## 2018-12-02 NOTE — Telephone Encounter (Signed)
Pt reports close contact with relative who tested Covid-19 positive. Contact was 14 days ago. Pt is asymptomatic at this time. 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 Also states 3 grand babies live with them. Pt's risk score 7. Criteria for testing reviewed with patient. [place] called practice, spoke with [facility], call transferred for further consideration, disposition.   Reason for Disposition . COVID-19 Testing, questions about  Answer Assessment - Initial Assessment Questions 1. COVID-19 DIAGNOSIS: "Who made your Coronavirus (COVID-19) diagnosis?" "Was it confirmed by a positive lab test?" If not diagnosed by a HCP, ask "Are there lots of cases (community spread) where you live?" (See public health department website, if unsure)   * MAJOR community spread: high number of cases; numbers of cases are increasing; many people hospitalized.   * MINOR community spread: low number of cases; not increasing; few or no people hospitalized     *No Answer* 2. ONSET: "When did the COVID-19 symptoms start?"      *No Answer* 3. WORST SYMPTOM: "What is your worst symptom?" (e.g., cough, fever, shortness of breath, muscle aches)     *No Answer* 4. COUGH: "Do you have a cough?" If so, ask: "How bad is the cough?"       *No Answer* 5. FEVER: "Do you have a fever?" If so, ask: "What is your temperature, how was it measured, and when did it start?"     *No Answer* 6. RESPIRATORY STATUS: "Describe your breathing?" (e.g., shortness of breath, wheezing, unable to speak)      *No Answer* 7. BETTER-SAME-WORSE: "Are you getting better, staying the same or getting worse compared to yesterday?"  If getting worse, ask, "In what way?"     *No Answer* 8. HIGH RISK DISEASE: "Do you have any chronic medical problems?" (e.g., asthma, heart or lung disease, weak immune system, etc.)     *No Answer* 9. PREGNANCY: "Is there any chance you are pregnant?" "When was  your last menstrual period?"     *No Answer* 10. OTHER SYMPTOMS: "Do you have any other symptoms?"  (e.g., runny nose, headache, sore throat, loss of smell)       No symptoms  Protocols used: CORONAVIRUS (COVID-19) DIAGNOSED OR SUSPECTED-A-AH

## 2018-12-02 NOTE — Telephone Encounter (Signed)
appt scheduled for today

## 2018-12-02 NOTE — Progress Notes (Signed)
Subjective:    Patient ID: [name], male    DOB: 05/09/1941, 78 y.o.   MRN: [id number]  HPI Virtual Visit via Video Note  I connected with the patient on 12/02/18 at  9:45 AM EDT by a video enabled telemedicine application and verified that I am speaking with the correct person using two identifiers.  Location patient: home Location provider:work or home office Persons participating in the virtual visit: patient, provider  I discussed the limitations of evaluation and management by telemedicine and the availability of in person appointments. The patient expressed understanding and agreed to proceed.   HPI: Here to ask about being tested for the Covid-19 virus. He feels fine but he just found out yesterday that his brother-in-law, who visited [name] and his wife in their home last weekend, is recovering from the virus. He had been treated and was feeling better, but he tested positive for the virus. For some reason when he came over to their house he did not wear a mask and he did not tell them about having the virus. Now [name] is concerned not only for himself but also for his wife, who will start radiation therapy tomorrow for recurrent lung cancer.    ROS: See pertinent positives and negatives per HPI.  Past Medical History:  Diagnosis Date  . Adenomatous polyp of colon 2007  . Arthritis   . Atrial flutter ([place])   . AVM (arteriovenous malformation) 2011   a. S/p argon plasma coagulation and ablation in 2011, 2017.  . Bradycardia    a. H/o almost 7sec pause nocturnally during 2011 admission. Also has h/o fatigue with BB.  [name] CAD (coronary artery disease)    a. Nonobst disease by cath 2009. b. s/p PCI 10/2014 with DES to [place], patent by relook 11/2014 (Brilinta changed to Plavix with improved sx).  . Chronic diastolic CHF (congestive heart failure) ([place])   . Depression   . Diverticulosis   . Gastritis 2011  . GERD (gastroesophageal reflux disease)   . History of  hiatal hernia   . Hyperlipidemia   . Hypertension   . Hypothyroidism   . Myocardial infarction ([place])   . Obstructive sleep apnea    -no cpap use  . Premature atrial contractions Holter 2016  . PVC's (premature ventricular contractions) Holter 2016  . Statin intolerance   . Transfusion history    several years ago -GI bleed    Past Surgical History:  Procedure Laterality Date  . ANKLE SURGERY Left   . APPENDECTOMY    . CARDIAC CATHETERIZATION N/A 11/26/2014   Procedure: Left Heart Cath and Coronary Angiography;  Surgeon: Burnell Blanks, MD;  Location: [facility];  Service: Cardiovascular;  Laterality: N/A;  . CHOLECYSTECTOMY N/A 08/23/2016   Procedure: LAPAROSCOPIC CHOLECYSTECTOMY;  Surgeon: Coralie Keens, MD;  Location: [facility];  Service: General;  Laterality: N/A;  . COLONOSCOPY  08-23-05   per Dr. Deatra Ina, adenomatous polyps, repeat in 5 yrs   . COLONOSCOPY WITH PROPOFOL N/A 12/06/2015   Procedure: COLONOSCOPY WITH PROPOFOL;  Surgeon: Doran Stabler, MD;  Location: [facility];  Service: Gastroenterology;  Laterality: N/A;  . ENTEROSCOPY N/A 12/06/2015   Procedure: ENTEROSCOPY;  Surgeon: Doran Stabler, MD;  [facility]: [facility];  Service: Gastroenterology;  Laterality: N/A;  . ESOPHAGOGASTRODUODENOSCOPY  08-23-05   per Dr. Deatra Ina, cauterized jejunal AVMs   . HERNIA REPAIR    . HOT HEMOSTASIS N/A 12/06/2015   Procedure: HOT HEMOSTASIS (  ARGON PLASMA COAGULATION/BICAP);  Surgeon: Doran Stabler, MD;  Location: [facility];  Service: Gastroenterology;  Laterality: N/A;  . LEFT HEART CATHETERIZATION WITH CORONARY ANGIOGRAM N/A 09/08/2012   Procedure: LEFT HEART CATHETERIZATION WITH CORONARY ANGIOGRAM;  Surgeon: Burnell Blanks, MD;  Location: [facility];  Service: Cardiovascular;  Laterality: N/A;  . LEFT HEART CATHETERIZATION WITH CORONARY ANGIOGRAM N/A 11/16/2014   Procedure: LEFT HEART CATHETERIZATION WITH CORONARY ANGIOGRAM;  Surgeon: Leonie Man, MD;   Location: [facility];  Service: Cardiovascular;  Laterality: N/A;  . PACEMAKER IMPLANT N/A 12/24/2016   Procedure: Pacemaker Implant;  Surgeon: Deboraha Sprang, MD;  Location: [facility];  Service: Cardiovascular;  Laterality: N/A;  . SKIN GRAFT Right    leg  . TONSILLECTOMY      Family History  Problem Relation Age of Onset  . Leukemia Mother   . Heart disease Father   . Stroke Father   . Heart attack Father   . Alcoholism Paternal Uncle   . Alcoholism Maternal Grandfather   . Colon cancer Neg Hx   . Esophageal cancer Neg Hx      Current Outpatient Medications:  .  apixaban (ELIQUIS) 5 MG TABS tablet, Take 5 mg by mouth 2 (two) times daily., Disp: , Rfl:  .  Ascorbic Acid (VITAMIN C PO), Take 4 tablets by mouth daily as needed (flu like symptopms). , Disp: , Rfl:  .  aspirin EC 81 MG tablet, Take 81 mg by mouth daily., Disp: , Rfl:  .  clonazePAM (KLONOPIN) 0.5 MG tablet, TAKE 1 TABLET BY MOUTH 3 TIMES A DAY AS NEEDED, Disp: 270 tablet, Rfl: 1 .  diclofenac sodium (VOLTAREN) 1 % GEL, Apply 1 application topically daily as needed (ankle pain)., Disp: 5 Tube, Rfl: 10 .  diltiazem (CARDIZEM CD) 120 MG 24 hr capsule, TAKE 1 CAPSULE BY MOUTH EVERY DAY, Disp: 90 capsule, Rfl: 3 .  ezetimibe (ZETIA) 10 MG tablet, TAKE 1 TABLET BY MOUTH EVERY DAY, Disp: 90 tablet, Rfl: 3 .  fluticasone (FLONASE) 50 MCG/ACT nasal spray, Place 1 spray into both nostrils daily as needed for allergies or rhinitis., Disp: 48 g, Rfl: 3 .  furosemide (LASIX) 20 MG tablet, Take 1 tablet (20 mg total) by mouth every other day. (Patient taking differently: Take 20 mg by mouth every other day. Takes as needed for edema.), Disp: 15 tablet, Rfl: 10 .  gabapentin (NEURONTIN) 100 MG capsule, TAKE 1 CAPSULE BY MOUTH THREE TIMES A DAY, Disp: 270 capsule, Rfl: 1 .  levothyroxine (SYNTHROID, LEVOTHROID) 175 MCG tablet, TAKE 1 TABLET (175 MCG TOTAL) BY MOUTH DAILY BEFORE BREAKFAST., Disp: 90 tablet, Rfl: 2 .   methocarbamol (ROBAXIN) 750 MG tablet, Take 750 mg by mouth 4 (four) times daily as needed for muscle spasms. , Disp: , Rfl:  .  metroNIDAZOLE (FLAGYL) 500 MG tablet, Take 1 tablet (500 mg total) by mouth 3 (three) times daily., Disp: 30 tablet, Rfl: 0 .  nitroGLYCERIN (NITROSTAT) 0.4 MG SL tablet, Place 1 tablet (0.4 mg total) under the tongue every 5 (five) minutes as needed for chest pain., Disp: 25 tablet, Rfl: 12 .  omeprazole (PRILOSEC) 20 MG capsule, TAKE 1 CAPSULE BY MOUTH TWICE A DAY, Disp: 60 capsule, Rfl: 11 .  [START ON 01/25/2019] oxycodone (ROXICODONE) 30 MG immediate release tablet, Take 1 tablet (30 mg total) by mouth every 6 (six) hours as needed for up to 30 days for pain., Disp: 120 tablet, Rfl: 0 .  polyethylene glycol (MIRALAX / GLYCOLAX) packet, Take 17 g by mouth daily as needed for mild constipation., Disp: , Rfl:  .  ropinirole (REQUIP) 5 MG tablet, Take 1 tablet (5 mg total) by mouth at bedtime., Disp: 90 tablet, Rfl: 3 .  albuterol (PROVENTIL HFA;VENTOLIN HFA) 108 (90 Base) MCG/ACT inhaler, Inhale 2 puffs into the lungs every 6 (six) hours as needed for wheezing or shortness of breath., Disp: 1 Inhaler, Rfl: 0  EXAM:  VITALS per patient if applicable:  GENERAL: alert, oriented, appears well and in no acute distress  HEENT: atraumatic, conjunttiva clear, no obvious abnormalities on inspection of external nose and ears  NECK: normal movements of the head and neck  LUNGS: on inspection no signs of respiratory distress, breathing rate appears normal, no obvious gross SOB, gasping or wheezing  CV: no obvious cyanosis  MS: moves all visible extremities without noticeable abnormality  PSYCH/NEURO: pleasant and cooperative, no obvious depression or anxiety, speech and thought processing grossly intact  ASSESSMENT AND PLAN: Exposure to Covid-19 virus. I advised both [name] and his wife Bertram Millard to be tested for the virus. They will go to the [facility] urgent care clinic in  [place] today for this.  [name], MD  Discussed the following assessment and plan:  No diagnosis found.     I discussed the assessment and treatment plan with the patient. The patient was provided an opportunity to ask questions and all were answered. The patient agreed with the plan and demonstrated an understanding of the instructions.   The patient was advised to call back or seek an in-person evaluation if the symptoms worsen or if the condition fails to improve as anticipated.     Review of Systems     Objective:   Physical Exam        Assessment & Plan:

## 2018-12-09 ENCOUNTER — Encounter: Payer: Self-pay | Admitting: Gastroenterology

## 2018-12-22 ENCOUNTER — Telehealth: Payer: Self-pay | Admitting: *Deleted

## 2018-12-22 NOTE — Telephone Encounter (Signed)
During chart review for Previsit, noted patient with cardiac hx is on Eliquis. Called the patient who stated he has been off Eliquis x 4 weeks related to inability to afford the medication. Sample had been given and no other help. Patient taking ASA.  Strongly Encouraged the patient to call his PCP to inform that he is off his anticoagulant and for his direction. Office visit scheduled with Dr Loletha Carrow and Previsit cancelled.

## 2018-12-23 ENCOUNTER — Encounter: Payer: Self-pay | Admitting: Gastroenterology

## 2018-12-23 ENCOUNTER — Ambulatory Visit (INDEPENDENT_AMBULATORY_CARE_PROVIDER_SITE_OTHER): Payer: PPO | Admitting: Gastroenterology

## 2018-12-23 ENCOUNTER — Other Ambulatory Visit: Payer: Self-pay

## 2018-12-23 DIAGNOSIS — D508 Other iron deficiency anemias: Secondary | ICD-10-CM

## 2018-12-23 DIAGNOSIS — Z860100 Personal history of colon polyps, unspecified: Secondary | ICD-10-CM

## 2018-12-23 NOTE — Progress Notes (Signed)
This patient contacted our office requesting a physician telemedicine consultation regarding clinical questions and/or test results. Interactive audio and video telecommunications were attempted between this provider and the patient.  However, this technology failed due to the patient having technical difficulties OR they did not have access to video capabilities.  We continued and completed the visit with audio only.  Participants on the conference : myself and patient   The patient consented to this consultation and was aware that a charge will be placed through their insurance.  I was in my office and the patient was at home   Encounter time:  Total time 24 minutes, with 17 minutes spent with patient on phone   _____________________________________________________________________________________________          [facility] Consult Note:  History: [name] 12/23/2018  Referring provider: [name], MD  Reason for consult/chief complaint: No chief complaint on file.   Subjective  HPI: Last seen for iron deficiency anemia requiring small bowel enteroscopy and colonoscopy on 12/06/2015.  To proximal small bowel bleeding AVMs were ablated with APC, and 8 tubular adenomas (some greater than or equal to 10 mm) were removed.  A fib/flutter, should be on Eliquis, but says he has not taken it for 5-6 weeks due to its cost. Says the med made him quite fatigued.   Got some samples from Cardiologist, but not many available. [place] ([employer]) did not approve for reduced cost/free med.  Denies chronic abdominal pain or altered bowel habits.  No rectal bleeding.  ROS:  Review of Systems Started with a gout flare in his foot yesterday - has meds from PCP to take for this Denies exertional chest pain or dyspnea  Past Medical History: Past Medical History:  Diagnosis Date  . Adenomatous polyp of colon 2007  . Arthritis   . Atrial flutter ([place])   .  AVM (arteriovenous malformation) 2011   a. S/p argon plasma coagulation and ablation in 2011, 2017.  . Bradycardia    a. H/o almost 7sec pause nocturnally during 2011 admission. Also has h/o fatigue with BB.  [name] CAD (coronary artery disease)    a. Nonobst disease by cath 2009. b. s/p PCI 10/2014 with DES to [place], patent by relook 11/2014 (Brilinta changed to Plavix with improved sx).  . Chronic diastolic CHF (congestive heart failure) ([place])   . Depression   . Diverticulosis   . Gastritis 2011  . GERD (gastroesophageal reflux disease)   . History of hiatal hernia   . Hyperlipidemia   . Hypertension   . Hypothyroidism   . Myocardial infarction ([place])   . Obstructive sleep apnea    -no cpap use  . Premature atrial contractions Holter 2016  . PVC's (premature ventricular contractions) Holter 2016  . Statin intolerance   . Transfusion history    several years ago -GI bleed     Past Surgical History: Past Surgical History:  Procedure Laterality Date  . ANKLE SURGERY Left   . APPENDECTOMY    . CARDIAC CATHETERIZATION N/A 11/26/2014   Procedure: Left Heart Cath and Coronary Angiography;  Surgeon: Burnell Blanks, MD;  Location: [facility];  Service: Cardiovascular;  Laterality: N/A;  . CHOLECYSTECTOMY N/A 08/23/2016   Procedure: LAPAROSCOPIC CHOLECYSTECTOMY;  Surgeon: Coralie Keens, MD;  Location: [facility];  Service: General;  Laterality: N/A;  . COLONOSCOPY  08-23-05   per Dr. Deatra Ina, adenomatous polyps, repeat in 5 yrs   . COLONOSCOPY WITH PROPOFOL N/A  12/06/2015   Procedure: COLONOSCOPY WITH PROPOFOL;  Surgeon: Doran Stabler, MD;  [facility];  Service: Gastroenterology;  Laterality: N/A;  . ENTEROSCOPY N/A 12/06/2015   Procedure: ENTEROSCOPY;  Surgeon: Doran Stabler, MD;  Location: [facility];  Service: Gastroenterology;  Laterality: N/A;  . ESOPHAGOGASTRODUODENOSCOPY  08-23-05   per Dr. Deatra Ina, cauterized jejunal AVMs   . HERNIA REPAIR    . HOT  HEMOSTASIS N/A 12/06/2015   Procedure: HOT HEMOSTASIS (ARGON PLASMA COAGULATION/BICAP);  Surgeon: Doran Stabler, MD;  Location: [facility];  Service: Gastroenterology;  Laterality: N/A;  . LEFT HEART CATHETERIZATION WITH CORONARY ANGIOGRAM N/A 09/08/2012   Procedure: LEFT HEART CATHETERIZATION WITH CORONARY ANGIOGRAM;  Surgeon: Burnell Blanks, MD;  Location: [facility];  Service: Cardiovascular;  Laterality: N/A;  . LEFT HEART CATHETERIZATION WITH CORONARY ANGIOGRAM N/A 11/16/2014   Procedure: LEFT HEART CATHETERIZATION WITH CORONARY ANGIOGRAM;  Surgeon: Leonie Man, MD;  Location: [facility] LAB;  Service: Cardiovascular;  Laterality: N/A;  . PACEMAKER IMPLANT N/A 12/24/2016   Procedure: Pacemaker Implant;  Surgeon: Deboraha Sprang, MD;  Location: [facility];  Service: Cardiovascular;  Laterality: N/A;  . SKIN GRAFT Right    leg  . TONSILLECTOMY       Family History: Family History  Problem Relation Age of Onset  . Leukemia Mother   . Heart disease Father   . Stroke Father   . Heart attack Father   . Alcoholism Paternal Uncle   . Alcoholism Maternal Grandfather   . Colon cancer Neg Hx   . Esophageal cancer Neg Hx     Social History: Social History   Socioeconomic History  . Marital status: Married    Spouse name: Not on file  . Number of children: 1  . Years of education: Not on file  . Highest education level: Not on file  Occupational History  . Occupation: Disabled    Fish farm manager: UNEMPLOYED  Social Needs  . Financial resource strain: Not on file  . Food insecurity:    Worry: Not on file    Inability: Not on file  . Transportation needs:    Medical: Not on file    Non-medical: Not on file  Tobacco Use  . Smoking status: Former Smoker    Packs/day: 2.00    Years: 50.00    Pack years: 100.00    Types: Cigarettes    Last attempt to quit: 07/23/2002    Years since quitting: 16.4  . Smokeless tobacco: Never Used  Substance and Sexual Activity  .  Alcohol use: No    Alcohol/week: 0.0 standard drinks  . Drug use: No  . Sexual activity: Not on file  Lifestyle  . Physical activity:    Days per week: Not on file    Minutes per session: Not on file  . Stress: Not on file  Relationships  . Social connections:    Talks on phone: Not on file    Gets together: Not on file    Attends religious service: Not on file    Active member of club or organization: Not on file    Attends meetings of clubs or organizations: Not on file    Relationship status: Not on file  Other Topics Concern  . Not on file  Social History Narrative  . Not on file    Allergies: Allergies  Allergen Reactions  . Brilinta [Ticagrelor] Shortness Of Breath  . Metoprolol Tartrate Other (See Comments)  Severe chest pains " flat lined patient"  . Mirapex [Pramipexole Dihydrochloride]     Severe leg pain  . Shellfish Allergy Anaphylaxis and Hives  . Statins Other (See Comments)    All statins cause myalgias   . Zolpidem Other (See Comments)    Chest pain  . Levaquin [Levofloxacin] Hives  . Trazodone And Nefazodone Other (See Comments)    Unsteady on feet    Outpatient Meds: Current Outpatient Medications  Medication Sig Dispense Refill  . albuterol (PROVENTIL HFA;VENTOLIN HFA) 108 (90 Base) MCG/ACT inhaler Inhale 2 puffs into the lungs every 6 (six) hours as needed for wheezing or shortness of breath. 1 Inhaler 0  . apixaban (ELIQUIS) 5 MG TABS tablet Take 5 mg by mouth 2 (two) times daily.    . Ascorbic Acid (VITAMIN C PO) Take 4 tablets by mouth daily as needed (flu like symptopms).     [name] aspirin EC 81 MG tablet Take 81 mg by mouth daily.    . clonazePAM (KLONOPIN) 0.5 MG tablet TAKE 1 TABLET BY MOUTH 3 TIMES A DAY AS NEEDED 270 tablet 1  . diclofenac sodium (VOLTAREN) 1 % GEL Apply 1 application topically daily as needed (ankle pain). 5 Tube 10  . diltiazem (CARDIZEM CD) 120 MG 24 hr capsule TAKE 1 CAPSULE BY MOUTH EVERY DAY 90 capsule 3  .  ezetimibe (ZETIA) 10 MG tablet TAKE 1 TABLET BY MOUTH EVERY DAY 90 tablet 3  . fluticasone (FLONASE) 50 MCG/ACT nasal spray Place 1 spray into both nostrils daily as needed for allergies or rhinitis. 48 g 3  . furosemide (LASIX) 20 MG tablet Take 1 tablet (20 mg total) by mouth every other day. (Patient taking differently: Take 20 mg by mouth every other day. Takes as needed for edema.) 15 tablet 10  . gabapentin (NEURONTIN) 100 MG capsule TAKE 1 CAPSULE BY MOUTH THREE TIMES A DAY 270 capsule 1  . levothyroxine (SYNTHROID, LEVOTHROID) 175 MCG tablet TAKE 1 TABLET (175 MCG TOTAL) BY MOUTH DAILY BEFORE BREAKFAST. 90 tablet 2  . methocarbamol (ROBAXIN) 750 MG tablet Take 750 mg by mouth 4 (four) times daily as needed for muscle spasms.     . metroNIDAZOLE (FLAGYL) 500 MG tablet Take 1 tablet (500 mg total) by mouth 3 (three) times daily. 30 tablet 0  . nitroGLYCERIN (NITROSTAT) 0.4 MG SL tablet Place 1 tablet (0.4 mg total) under the tongue every 5 (five) minutes as needed for chest pain. 25 tablet 12  . omeprazole (PRILOSEC) 20 MG capsule TAKE 1 CAPSULE BY MOUTH TWICE A DAY 60 capsule 11  . [START ON 01/25/2019] oxycodone (ROXICODONE) 30 MG immediate release tablet Take 1 tablet (30 mg total) by mouth every 6 (six) hours as needed for up to 30 days for pain. 120 tablet 0  . polyethylene glycol (MIRALAX / GLYCOLAX) packet Take 17 g by mouth daily as needed for mild constipation.    . ropinirole (REQUIP) 5 MG tablet Take 1 tablet (5 mg total) by mouth at bedtime. 90 tablet 3   No current facility-administered medications for this visit.       ___________________________________________________________________ Objective   Exam:    - no exam - virtual visit  Labs:  CBC Latest Ref Rng & Units 08/27/2018 10/09/2017 09/09/2017  WBC 4.0 - 10.5 K/uL 10.4 9.5 7.7  Hemoglobin 13.0 - 17.0 g/dL 14.5 10.3(L) 9.7(L)  Hematocrit 39.0 - 52.0 % 44.6 36.5(L) 32.0(L)  Platelets 150.0 - 400.0 K/uL 293.0 297 286.0  Last available iron studies march 2019 with ferritin = 4  Radiologic Studies:  Last echocardiogram 01/08/2017 with LVEF 28%, grade 1 diastolic dysfunction no significant valvular disease   Assessment: Encounter Diagnoses  Name Primary?  . Personal history of colonic polyps Yes  . Iron deficiency anemia secondary to inadequate dietary iron intake     Surveillance colonoscopy , based on current guidelines. Not currently on [place] since cannot afford it. Remain on aspirin for procedure, per our usual practice.  Plan:  Already scheduled for colonoscopy on 6/22 with me, with prep/instructions already managed by our clinical staff.  Thank you for the courtesy of this consult.  Please call me with any questions or concerns.  Nelida Meuse III  CC: Referring provider noted above

## 2018-12-31 ENCOUNTER — Ambulatory Visit (INDEPENDENT_AMBULATORY_CARE_PROVIDER_SITE_OTHER): Payer: PPO | Admitting: *Deleted

## 2018-12-31 DIAGNOSIS — I495 Sick sinus syndrome: Secondary | ICD-10-CM | POA: Diagnosis not present

## 2019-01-01 ENCOUNTER — Telehealth: Payer: Self-pay

## 2019-01-01 NOTE — Telephone Encounter (Signed)
Left message for patient to remind of missed remote transmission.  

## 2019-01-02 LAB — CUP PACEART REMOTE DEVICE CHECK
Battery Remaining Longevity: 155 mo
Battery Voltage: 3.03 V
Brady Statistic AP VP Percent: 3.91 %
Brady Statistic AP VS Percent: 13.07 %
Brady Statistic AS VP Percent: 9.4 %
Brady Statistic AS VS Percent: 73.63 %
Brady Statistic RA Percent Paced: 17.72 %
Brady Statistic RV Percent Paced: 13.31 %
Date Time Interrogation Session: 20200612050356
Implantable Lead Implant Date: 20180604
Implantable Lead Implant Date: 20180604
Implantable Lead Location: 753859
Implantable Lead Location: 753860
Implantable Lead Model: 5076
Implantable Lead Model: 5076
Implantable Pulse Generator Implant Date: 20180604
Lead Channel Impedance Value: 304 Ohm
Lead Channel Impedance Value: 380 Ohm
Lead Channel Impedance Value: 437 Ohm
Lead Channel Impedance Value: 475 Ohm
Lead Channel Pacing Threshold Amplitude: 0.625 V
Lead Channel Pacing Threshold Amplitude: 0.75 V
Lead Channel Pacing Threshold Pulse Width: 0.4 ms
Lead Channel Pacing Threshold Pulse Width: 0.4 ms
Lead Channel Sensing Intrinsic Amplitude: 1.5 mV
Lead Channel Sensing Intrinsic Amplitude: 1.5 mV
Lead Channel Sensing Intrinsic Amplitude: 4.375 mV
Lead Channel Sensing Intrinsic Amplitude: 4.375 mV
Lead Channel Setting Pacing Amplitude: 1.75 V
Lead Channel Setting Pacing Amplitude: 2.5 V
Lead Channel Setting Pacing Pulse Width: 0.4 ms
Lead Channel Setting Sensing Sensitivity: 1.2 mV

## 2019-01-07 ENCOUNTER — Telehealth: Payer: Self-pay | Admitting: Family Medicine

## 2019-01-07 MED ORDER — ROPINIROLE HCL 5 MG PO TABS: 5.0000 mg | ORAL_TABLET | Freq: Two times a day (BID) | ORAL | 3 refills | Status: DC

## 2019-01-07 NOTE — Telephone Encounter (Signed)
Dr. Sarajane Jews please advise on change in requip to BID   Thanks

## 2019-01-07 NOTE — Telephone Encounter (Signed)
Patient needs the ropinirole (REQUIP) 5 MG tablet changed to 2x per day. The current script at the pharmacy says 1x per day.   [id number] - [place], [place] - [address]  [address] [place] [address]  Phone: [phone] Fax: [phone]

## 2019-01-07 NOTE — Telephone Encounter (Signed)
Increase Requip 5 mg to BID, call in #[phone] with 5 rf

## 2019-01-08 ENCOUNTER — Encounter: Payer: Self-pay | Admitting: Cardiology

## 2019-01-08 ENCOUNTER — Telehealth: Payer: Self-pay | Admitting: Cardiovascular Disease

## 2019-01-08 ENCOUNTER — Ambulatory Visit: Payer: Self-pay | Admitting: Family Medicine

## 2019-01-08 ENCOUNTER — Telehealth: Payer: Self-pay | Admitting: *Deleted

## 2019-01-08 NOTE — Telephone Encounter (Signed)
Pt has called cardiology for recommendations.

## 2019-01-08 NOTE — Telephone Encounter (Signed)
Pt. Reports he fell 4 weeks ago and ever since has had chest pain in the middle of his chest and to the left below his pacemaker. When he fell reports he fell on his butt and hit his head. Has shortness of breath as well. Pain comes and goes. Pain is 3-4/10 currently. Has not taken his Eliquis in 6 weeks due to the cost. Tammy in the practice recommends pt. Call his cardiologist. Instructed pt. If symptoms worsen to got to ED. States he does not "want to do that." States he will call cardiology now.  Answer Assessment - Initial Assessment Questions 1. LOCATION: "Where does it hurt?"       Hurts middle and on left below pacemaker 2. RADIATION: "Does the pain go anywhere else?" (e.g., into neck, jaw, arms, back)     Jaw, sometimes 3. ONSET: "When did the chest pain begin?" (Minutes, hours or days)      3-4 weeks ago 4. PATTERN "Does the pain come and go, or has it been constant since it started?"  "Does it get worse with exertion?"      Comes and goes  5. DURATION: "How long does it last" (e.g., seconds, minutes, hours)     Lasts hours sometimes 6. SEVERITY: "How bad is the pain?"  (e.g., Scale 1-10; mild, moderate, or severe)    - MILD (1-3): doesn't interfere with normal activities     - MODERATE (4-7): interferes with normal activities or awakens from sleep    - SEVERE (8-10): excruciating pain, unable to do any normal activities       Now - 3-4 7. CARDIAC RISK FACTORS: "Do you have any history of heart problems or risk factors for heart disease?" (e.g., prior heart attack, angina; high blood pressure, diabetes, being overweight, high cholesterol, smoking, or strong family history of heart disease)     Pacemaker, 3 heart attacks 8. PULMONARY RISK FACTORS: "Do you have any history of lung disease?"  (e.g., blood clots in lung, asthma, emphysema, birth control pills)     No 9. CAUSE: "What do you think is causing the chest pain?"     Had a fall 3-4 weeks ago 10. OTHER SYMPTOMS: "Do you have  any other symptoms?" (e.g., dizziness, nausea, vomiting, sweating, fever, difficulty breathing, cough)       Shortness of breath 11. PREGNANCY: "Is there any chance you are pregnant?" "When was your last menstrual period?"       n/a  Protocols used: CHEST PAIN-A-AH

## 2019-01-08 NOTE — Telephone Encounter (Addendum)
I spoke with pt. He reports fall from a rolling stool onto cement floor in his garage about 3 weeks ago.  Landed on bottom and fell backwards.  Hit his head and bruised hip.  Did not seek medical attention at that time. Since fall he has had off and on chest pain. Was instructed by primary care to contact cardiology.  Pt reports pain feels like "when you can't burp."  When he tries to eat he has difficulty swallowing and feels like a "gas bubble is blocking" his food.   Pt also complains of shortness of breath at times.  Occurs when walking.  This started after his fall. Chest pain has been present since fall. If he is busy he does not pay attention to it but pain worsens at night and when he rests.  He had neck and jaw pain a few days ago. He took 2 NTG but this not help pain.  Also took omeprazole with no relief.  He took oxycodone and was eventually able to go to sleep.  Pt was on Eliquis but stopped about 6 weeks ago due to cost.  Pt is scheduled for colonoscopy on June 22.  I asked him to contact his GI doctor regarding gas bubble feeling and trouble swallowing. I scheduled pt for virtual visit with B. [name] Fire, [place] on June 23,2020 at 10:00.  Will forward to Dr. [name] to see if he feels pt needs in office visit. Chart reviewed and per primary care note from May 12,2020 pt was to have covid-19 test.  Pt does not remember if he had this testing done.  Pt aware he should go to ED if symptoms worsen prior to scheduled appointment.

## 2019-01-08 NOTE — Progress Notes (Signed)
Remote pacemaker transmission.   

## 2019-01-08 NOTE — Telephone Encounter (Signed)
New Message:   Pt said he had a fall about 3 weeks ago from a low stool, fell on some cemennt. Since that time been in severe pain .He have been hurting in chest, feels like it is 80lbs on his chest right now.  He says it is feeling that at this time.Also feels like it is gas, can not burp at times.  Pt would like to talk to the nurse asap please.

## 2019-01-08 NOTE — Telephone Encounter (Signed)
Video visit with B. [name] Fire, [place] on June 23,2020 at 10:00 Consent obtained on June 18,2020.  Virtual Visit Pre-Appointment Phone Call  "(Name), I am calling you today to discuss your upcoming appointment. We are currently trying to limit exposure to the virus that causes COVID-19 by seeing patients at home rather than in the office."  1. "What is the BEST phone number to call the day of the visit?" - include this in appointment notes--[phone]  2. "Do you have or have access to (through a family member/friend) a smartphone with video capability that we can use for your visit?" a. If yes - list this number in appt notes as "cell" (if different from BEST phone #) and list the appointment type as a VIDEO visit in appointment notes b. If no - list the appointment type as a PHONE visit in appointment notes  3. Confirm consent - "In the setting of the current Covid19 crisis, you are scheduled for a video) visit with your provider on B. [name] Fire, [place] on June 23,2020 at 10:00  Just as we do with many in-office visits, in order for you to participate in this visit, we must obtain consent.  If you'd like, I can send this to your mychart (if signed up) or email for you to review.  Otherwise, I can obtain your verbal consent now.  All virtual visits are billed to your insurance company just like a normal visit would be.  By agreeing to a virtual visit, we'd like you to understand that the technology does not allow for your provider to perform an examination, and thus may limit your provider's ability to fully assess your condition. If your provider identifies any concerns that need to be evaluated in person, we will make arrangements to do so.  Finally, though the technology is pretty good, we cannot assure that it will always work on either your or our end, and in the setting of a video visit, we may have to convert it to a phone-only visit.  In either situation, we cannot ensure that we have a secure  connection.  Are you willing to proceed?" STAFF: Did the patient verbally acknowledge consent to telehealth visit? Document YES/NO here: yes  4. Advise patient to be prepared - "Two hours prior to your appointment, go ahead and check your blood pressure, pulse, oxygen saturation, and your weight (if you have the equipment to check those) and write them all down. When your visit starts, your provider will ask you for this information. If you have an Apple Watch or Kardia device, please plan to have heart rate information ready on the day of your appointment. Please have a pen and paper handy nearby the day of the visit as well."  5. Give patient instructions for MyChart download to smartphone OR Doximity/Doxy.me as below if video visit (depending on what platform provider is using)  6. Inform patient they will receive a phone call 15 minutes prior to their appointment time (may be from unknown caller ID) so they should be prepared to answer    TELEPHONE CALL NOTE  [name] has been deemed a candidate for a follow-up tele-health visit to limit community exposure during the Covid-19 pandemic. I spoke with the patient via phone to ensure availability of phone/video source, confirm preferred email & phone number, and discuss instructions and expectations.  I reminded [name] to be prepared with any vital sign and/or heart rhythm information that could potentially be obtained via home  monitoring, at the time of his visit. I reminded [name] to expect a phone call prior to his visit.  Leodis Liverpool, RN 01/08/2019 4:16 PM   INSTRUCTIONS FOR DOWNLOADING THE MYCHART APP TO SMARTPHONE  - The patient must first make sure to have activated MyChart and know their login information - If Apple, go to [employer] and type in MyChart in the search bar and download the app. If Android, ask patient to go to [employer] and type in [place] in the search bar and download the app. The  app is free but as with any other app downloads, their phone may require them to verify saved payment information or Apple/Android password.  - The patient will need to then log into the app with their MyChart username and password, and select [facility] as their healthcare provider to link the account. When it is time for your visit, go to the MyChart app, find appointments, and click Begin Video Visit. Be sure to Select Allow for your device to access the Microphone and Camera for your visit. You will then be connected, and your provider will be with you shortly.  **If they have any issues connecting, or need assistance please contact MyChart service desk [phone])**  **If using a computer, in order to ensure the best quality for their visit they will need to use either of the following Internet Browsers: [employer], or Google Chrome**  IF USING DOXIMITY or DOXY.ME - The patient will receive a link just prior to their visit by text.     FULL LENGTH CONSENT FOR TELE-HEALTH VISIT   I hereby voluntarily request, consent and authorize [facility] and its employed or contracted physicians, physician assistants, nurse practitioners or other licensed health care professionals (the Practitioner), to provide me with telemedicine health care services (the "Services") as deemed necessary by the treating Practitioner. I acknowledge and consent to receive the Services by the Practitioner via telemedicine. I understand that the telemedicine visit will involve communicating with the Practitioner through live audiovisual communication technology and the disclosure of certain medical information by electronic transmission. I acknowledge that I have been given the opportunity to request an in-person assessment or other available alternative prior to the telemedicine visit and am voluntarily participating in the telemedicine visit.  I understand that I have the right to withhold or withdraw  my consent to the use of telemedicine in the course of my care at any time, without affecting my right to future care or treatment, and that the Practitioner or I may terminate the telemedicine visit at any time. I understand that I have the right to inspect all information obtained and/or recorded in the course of the telemedicine visit and may receive copies of available information for a reasonable fee.  I understand that some of the potential risks of receiving the Services via telemedicine include:  [name] Delay or interruption in medical evaluation due to technological equipment failure or disruption; . Information transmitted may not be sufficient (e.g. poor resolution of images) to allow for appropriate medical decision making by the Practitioner; and/or  . In rare instances, security protocols could fail, causing a breach of personal health information.  Furthermore, I acknowledge that it is my responsibility to provide information about my medical history, conditions and care that is complete and accurate to the best of my ability. I acknowledge that Practitioner's advice, recommendations, and/or decision may be based on factors not within their control, such as incomplete or  inaccurate data provided by me or distortions of diagnostic images or specimens that may result from electronic transmissions. I understand that the practice of medicine is not an exact science and that Practitioner makes no warranties or guarantees regarding treatment outcomes. I acknowledge that I will receive a copy of this consent concurrently upon execution via email to the email address I last provided but may also request a printed copy by calling the office of [facility].    I understand that my insurance will be billed for this visit.   I have read or had this consent read to me. . I understand the contents of this consent, which adequately explains the benefits and risks of the Services being provided via  telemedicine.  . I have been provided ample opportunity to ask questions regarding this consent and the Services and have had my questions answered to my satisfaction. . I give my informed consent for the services to be provided through the use of telemedicine in my medical care  By participating in this telemedicine visit I agree to the above.

## 2019-01-09 ENCOUNTER — Telehealth: Payer: Self-pay | Admitting: Gastroenterology

## 2019-01-09 NOTE — Telephone Encounter (Signed)
Called pt and he is at the pharmacy trying to pick up the prep for Monday. The pt states he was having chest pain and could not remember what the prep was. Informed the pt he does not need an colonoscopy if he is having chest pain,  he needs to go to the ED. The pt states he will call back in 10 minutes. Called pt back and informed him to go to the ED because of SOB and chest pain. The pt states he is being evaluated for the chest pain on 01/13/2019 by Ellen Henri PA.  Advised the pt to go to the ED to be evaluated and call his cardiologist. We will cancel the colon on 01/12/19 until pt gets evaluated for his chest pain and SOB. Pt understands. Gwyndolyn Saxon in [facility]

## 2019-01-09 NOTE — Telephone Encounter (Signed)
Patient called said he has not received his prep for his procedure on Monday 6-22  [facility] [address]

## 2019-01-09 NOTE — Telephone Encounter (Signed)
Received call transferred directly from operator and spoke with pt.  He reports he had chest pain last night that "took him to his knees."  Had to take NTG.  He reports pressure in chest at current time.  I advised pt to call 911 for transport to hospital due to frequency and worsening of chest pain.

## 2019-01-09 NOTE — Telephone Encounter (Signed)
I think a virtual visit is ok. Thanks, chris

## 2019-01-10 ENCOUNTER — Other Ambulatory Visit: Payer: Self-pay

## 2019-01-10 ENCOUNTER — Emergency Department (HOSPITAL_COMMUNITY): Payer: PPO

## 2019-01-10 ENCOUNTER — Emergency Department (HOSPITAL_COMMUNITY)
Admission: EM | Admit: 2019-01-10 | Discharge: 2019-01-11 | Disposition: A | Discharge status: Home / Self Care | Payer: PPO | Attending: Emergency Medicine | Admitting: Emergency Medicine

## 2019-01-10 DIAGNOSIS — Z5321 Procedure and treatment not carried out due to patient leaving prior to being seen by health care provider: Secondary | ICD-10-CM | POA: Insufficient documentation

## 2019-01-10 DIAGNOSIS — R079 Chest pain, unspecified: Secondary | ICD-10-CM | POA: Insufficient documentation

## 2019-01-10 LAB — CBC
HCT: 46.3 % (ref 39.0–52.0)
Hemoglobin: 14.7 g/dL (ref 13.0–17.0)
MCH: 28.1 pg (ref 26.0–34.0)
MCHC: 31.7 g/dL (ref 30.0–36.0)
MCV: 88.4 fL (ref 80.0–100.0)
Platelets: 325 10*3/uL (ref 150–400)
RBC: 5.24 MIL/uL (ref 4.22–5.81)
RDW: 15.3 % (ref 11.5–15.5)
WBC: 11.4 10*3/uL — ABNORMAL HIGH (ref 4.0–10.5)
nRBC: 0 % (ref 0.0–0.2)

## 2019-01-10 MED ORDER — SODIUM CHLORIDE 0.9% FLUSH: 3.0000 mL | Freq: Once | INTRAVENOUS | Status: DC

## 2019-01-10 NOTE — ED Triage Notes (Signed)
Pt reportsCP radiating to jaw starting around 8 pm tonight. Pt reports intermittent CP x 3 weeks after falling off a stool. Pt reports he took 2 nitro at home PTA. Pt was seen by EMS but denied transport. Pt has hx of 3 MIs

## 2019-01-11 LAB — BASIC METABOLIC PANEL
Anion gap: 11 (ref 5–15)
BUN: 16 mg/dL (ref 8–23)
CO2: 23 mmol/L (ref 22–32)
Calcium: 9.4 mg/dL (ref 8.9–10.3)
Chloride: 106 mmol/L (ref 98–111)
Creatinine, Ser: 1.35 mg/dL — ABNORMAL HIGH (ref 0.61–1.24)
GFR calc Af Amer: 58 mL/min — ABNORMAL LOW (ref >60)
GFR calc non Af Amer: 50 mL/min — ABNORMAL LOW (ref >60)
Glucose, Bld: 101 mg/dL — ABNORMAL HIGH (ref 70–99)
Potassium: 3.7 mmol/L (ref 3.5–5.1)
Sodium: 140 mmol/L (ref 135–145)

## 2019-01-11 LAB — TROPONIN I: Troponin I: <0.03 ng/mL (ref <0.03)

## 2019-01-11 NOTE — ED Notes (Signed)
Pt requesting to leave. RN unable to convince pt to stay. Pt LWBS

## 2019-01-12 ENCOUNTER — Encounter: Payer: PPO | Admitting: Gastroenterology

## 2019-01-13 ENCOUNTER — Telehealth (INDEPENDENT_AMBULATORY_CARE_PROVIDER_SITE_OTHER): Payer: PPO | Admitting: Cardiology

## 2019-01-13 ENCOUNTER — Encounter: Payer: Self-pay | Admitting: Cardiology

## 2019-01-13 ENCOUNTER — Telehealth: Payer: Self-pay | Admitting: Cardiology

## 2019-01-13 ENCOUNTER — Other Ambulatory Visit: Payer: Self-pay

## 2019-01-13 VITALS — BP 136/81 | HR 81 | Ht 70.0 in | Wt 195.0 lb

## 2019-01-13 DIAGNOSIS — R0600 Dyspnea, unspecified: Secondary | ICD-10-CM

## 2019-01-13 DIAGNOSIS — R0789 Other chest pain: Secondary | ICD-10-CM

## 2019-01-13 DIAGNOSIS — R079 Chest pain, unspecified: Secondary | ICD-10-CM

## 2019-01-13 MED ORDER — APIXABAN 5 MG PO TABS: 5.0000 mg | ORAL_TABLET | Freq: Two times a day (BID) | ORAL | 0 refills | Status: DC

## 2019-01-13 MED ORDER — EZETIMIBE 10 MG PO TABS: 10.0000 mg | ORAL_TABLET | Freq: Every day | ORAL | 3 refills | Status: DC

## 2019-01-13 MED ORDER — DILTIAZEM HCL ER COATED BEADS 120 MG PO CP24: 120.0000 mg | ORAL_CAPSULE | Freq: Every day | ORAL | 3 refills | Status: DC

## 2019-01-13 NOTE — Telephone Encounter (Addendum)
**Note De-identified  Obfuscation** Thank you  for update

## 2019-01-13 NOTE — Patient Instructions (Signed)
Medication Instructions:  none If you need a refill on your cardiac medications before your next appointment, please call your pharmacy.   Lab work: none If you have labs (blood work) drawn today and your tests are completely normal, you will receive your results only by: [name] MyChart Message (if you have MyChart) OR . A paper copy in the mail If you have any lab test that is abnormal or we need to change your treatment, we will call you to review the results.  Testing/Procedures:  Someone will call to schedule Your physician has requested that you have an echocardiogram. Echocardiography is a painless test that uses sound waves to create images of your heart. It provides your doctor with information about the size and shape of your heart and how well your heart's chambers and valves are working. This procedure takes approximately one hour. There are no restrictions for this procedure.   Your physician has requested that you have a lexiscan myoview. For further information please visit [email or web address]. Please follow instruction sheet, as given.     Follow-Up:1-2 WEEKS AFTER ECHO and STRESS TEST WITH Lyda Jester, PA At [facility], you and your health needs are our priority.  As part of our continuing mission to provide you with exceptional heart care, we have created designated Provider Care Teams.  These Care Teams include your primary Cardiologist (physician) and Advanced Practice Providers (APPs -  Physician Assistants and Nurse Practitioners) who all work together to provide you with the care you need, when you need it. .   Any Other Special Instructions Will Be Listed Below (If Applicable).

## 2019-01-13 NOTE — Telephone Encounter (Signed)
Wyonia Hough, LPN, Frederik Schmidt, RN, came to me about this pt taking Eliquis 5 mg tablet. Pt has not been taking it and so [place], [place] wanted samples of Eliquis until pt can get his insurance straight. I gave the pt 2 boxes of Eliquis 5 mg tablets and a pt assistance application. FYI

## 2019-01-13 NOTE — Progress Notes (Signed)
Virtual Visit via Video Note   This visit type was conducted due to national recommendations for restrictions regarding the COVID-19 Pandemic (e.g. social distancing) in an effort to limit this patient's exposure and mitigate transmission in our community.  Due to his co-morbid illnesses, this patient is at least at moderate risk for complications without adequate follow up.  This format is felt to be most appropriate for this patient at this time.  All issues noted in this document were discussed and addressed.  A limited physical exam was performed with this format.  Please refer to the patient's chart for his consent to telehealth for [facility].   Date:  01/13/2019   ID:  [name], DOB 1940-12-15, MRN [id number]  Patient Location: Home Provider Location: Office  PCP:  [name], MD  Cardiologist:  [name], MD  Electrophysiologist:  None   Evaluation Performed:  Follow-Up Visit  Chief Complaint:  Chest Pain   History of Present Illness:    [name] is a 78 y.o. male with a h/o CAD status post DES to the LAD and RCA 10/2014, repeat cath 11/2014 due to dyspnea with stable disease, Brilinta stopped & symptoms improved.  Nuclear stress test 12/2016 showed large apical anterior apical and inferior apical scar but no ischemia.  Also has hypertension, HLD-intolerant to statins, on Zetia, sleep apnea, sinus node dysfunction and atrial flutter status post pacemaker 12/24/2016.  He takes Eliquis for a/c but has had difficulty with cost. Did not tolerate Xarelto in the past.  Last echo 01/08/2017 normal LV size and function no valve issues. He is followed by Dr. [name].   Pt called the office yesterday w/ complaints of chest pain since suffering a mechanical fall. Per telephone note, He reports fall from a rolling stool onto cement floor in his garage about 3 weeks ago.  Landed on bottom and fell backwards.  Hit his head and bruised hip.  Did not seek medical  attention at that time. Since fall he has had off and on chest pain. Was instructed by primary care to contact cardiology.  Pt reports pain feels like "when you can't burp."  When he tries to eat he has difficulty swallowing and feels like a "gas bubble is blocking" his food.  Pt also complains of shortness of breath at times.  Occurs when walking.  This started after his fall. Chest pain has been present since fall. If he is busy he does not pay attention to it but pain worsens at night and when he rests.  He had neck and jaw pain a few days ago. He took 2 NTG but this not help pain.  Also took omeprazole with no relief.  He took oxycodone and was eventually able to go to sleep.  Pt was on Eliquis but stopped about 6 weeks ago due to cost.  Pt was scheduled for colonoscopy on June 22 (yesterday) but this has been postpone due to CP.  Pt was advised by triage RN to contact his GI doctor regarding gas bubble feeling and trouble swallowing. Pt also added on to my schedule today for cardiac assessment.   [name] describes to me a combination of chest pain with typical and atypical features.  He reports that after his fall he did develop significant chest discomfort that felt similar to his previous angina with substernal chest pressure and radiation up his neck to both the right and left jaw.  It was severe pain.  This  led him to go to the local fire department for an EKG.  There he was told that his heart was out of rhythm and he was referred to the emergency department.  [facility] and they got a chest x-ray which did not show any acute cardiopulmonary abnormalities.  There is no mention by radiologist of any rib fractures.  They also checked some labs.  Troponin was negative x1.  His EKG showed sinus rhythm with PACs.  Unfortunately due to the long wait and his restless leg syndrome, the patient was getting very tired of waiting and did not stay for physician evaluation.   He left the ED and called the office yesterday as outlined above and was set up for today's visit.  Again he notes symptoms are similar to his previous angina however also some atypical features.  It also hurts to press on his chest.  He does have some shortness of breath associated with this.  He has tried ibuprofen at home with no significant improvement.  He told me that he did have some improvement with chest discomfort after taking a second nitro tablet Saturday evening.   The patient does not have symptoms concerning for COVID-19 infection (fever, chills, cough, or new shortness of breath).    Past Medical History:  Diagnosis Date  . Adenomatous polyp of colon 2007  . Arthritis   . Atrial flutter ([place])   . AVM (arteriovenous malformation) 2011   a. S/p argon plasma coagulation and ablation in 2011, 2017.  . Bradycardia    a. H/o almost 7sec pause nocturnally during 2011 admission. Also has h/o fatigue with BB.  [name] CAD (coronary artery disease)    a. Nonobst disease by cath 2009. b. s/p PCI 10/2014 with DES to [place], patent by relook 11/2014 (Brilinta changed to Plavix with improved sx).  . Chronic diastolic CHF (congestive heart failure) ([place])   . Depression   . Diverticulosis   . Gastritis 2011  . GERD (gastroesophageal reflux disease)   . History of hiatal hernia   . Hyperlipidemia   . Hypertension   . Hypothyroidism   . Myocardial infarction ([place])   . Obstructive sleep apnea    -no cpap use  . Premature atrial contractions Holter 2016  . PVC's (premature ventricular contractions) Holter 2016  . Statin intolerance   . Transfusion history    several years ago -GI bleed   Past Surgical History:  Procedure Laterality Date  . ANKLE SURGERY Left   . APPENDECTOMY    . CARDIAC CATHETERIZATION N/A 11/26/2014   Procedure: Left Heart Cath and Coronary Angiography;  Surgeon: Burnell Blanks, MD;  Location: [facility];  Service: Cardiovascular;  Laterality: N/A;   . CHOLECYSTECTOMY N/A 08/23/2016   Procedure: LAPAROSCOPIC CHOLECYSTECTOMY;  Surgeon: Coralie Keens, MD;  Location: [facility];  Service: General;  Laterality: N/A;  . COLONOSCOPY  08-23-05   per Dr. Deatra Ina, adenomatous polyps, repeat in 5 yrs   . COLONOSCOPY WITH PROPOFOL N/A 12/06/2015   Procedure: COLONOSCOPY WITH PROPOFOL;  Surgeon: Doran Stabler, MD;  Location: [facility];  Service: Gastroenterology;  Laterality: N/A;  . ENTEROSCOPY N/A 12/06/2015   Procedure: ENTEROSCOPY;  Surgeon: Doran Stabler, MD;  Location: [facility];  Service: Gastroenterology;  Laterality: N/A;  . ESOPHAGOGASTRODUODENOSCOPY  08-23-05   per Dr. Deatra Ina, cauterized jejunal AVMs   . HERNIA REPAIR    . HOT HEMOSTASIS N/A 12/06/2015  Procedure: HOT HEMOSTASIS (ARGON PLASMA COAGULATION/BICAP);  Surgeon: Doran Stabler, MD;  Location: [facility];  Service: Gastroenterology;  Laterality: N/A;  . LEFT HEART CATHETERIZATION WITH CORONARY ANGIOGRAM N/A 09/08/2012   Procedure: LEFT HEART CATHETERIZATION WITH CORONARY ANGIOGRAM;  Surgeon: Burnell Blanks, MD;  Location: [facility] LAB;  Service: Cardiovascular;  Laterality: N/A;  . LEFT HEART CATHETERIZATION WITH CORONARY ANGIOGRAM N/A 11/16/2014   Procedure: LEFT HEART CATHETERIZATION WITH CORONARY ANGIOGRAM;  Surgeon: Leonie Man, MD;  Location: [facility] CATH LAB;  Service: Cardiovascular;  Laterality: N/A;  . PACEMAKER IMPLANT N/A 12/24/2016   Procedure: Pacemaker Implant;  Surgeon: Deboraha Sprang, MD;  Location: [facility];  Service: Cardiovascular;  Laterality: N/A;  . SKIN GRAFT Right    leg  . TONSILLECTOMY       No outpatient medications have been marked as taking for the 01/13/19 encounter (Telemedicine) with Consuelo Pandy, PA-C.     Allergies:   Brilinta [ticagrelor], Metoprolol tartrate, Mirapex [pramipexole dihydrochloride], Shellfish allergy, Statins, Zolpidem, Coconut oil, Levaquin [levofloxacin], and Trazodone and nefazodone   Social  History   Tobacco Use  . Smoking status: Former Smoker    Packs/day: 2.00    Years: 50.00    Pack years: 100.00    Types: Cigarettes    Quit date: 07/23/2002    Years since quitting: 16.4  . Smokeless tobacco: Never Used  Substance Use Topics  . Alcohol use: No    Alcohol/week: 0.0 standard drinks  . Drug use: No     Family Hx: The patient's family history includes Alcoholism in his maternal grandfather and paternal uncle; Heart attack in his father; Heart disease in his father; Leukemia in his mother; Stroke in his father. There is no history of Colon cancer or Esophageal cancer.  ROS:   Please see the history of present illness.     All other systems reviewed and are negative.   Prior CV studies:   The following studies were reviewed today:  01/07/2017 Study Conclusions  - Left ventricle: The cavity size was normal. Wall thickness was   increased in a pattern of mild LVH. Systolic function was normal.   The estimated ejection fraction was in the range of 60% to 65%.   Wall motion was normal; there were no regional wall motion   abnormalities. Doppler parameters are consistent with abnormal   left ventricular relaxation (grade 1 diastolic dysfunction). - Aortic valve: Transvalvular velocity was within the normal range.   There was no stenosis. There was no regurgitation. - Mitral valve: Mildly calcified annulus. Transvalvular velocity   was within the normal range. There was no evidence for stenosis.   There was no regurgitation. - Left atrium: The atrium was mildly dilated. - Right ventricle: The cavity size was normal. Wall thickness was   normal. Systolic function was normal. - Atrial septum: No defect or patent foramen ovale was identified. - Tricuspid valve: There was trivial regurgitation.   Labs/Other Tests and Data Reviewed:    EKG:  An ECG dated 01/10/2019 was personally reviewed today and demonstrated:  Normal sinus rhythm with PACs  Recent Labs:  01/10/2019: BUN 16; Creatinine, Ser 1.35; Hemoglobin 14.7; Platelets 325; Potassium 3.7; Sodium 140   Recent Lipid Panel Lab Results  Component Value Date/Time   CHOL 148 12/11/2017 08:37 AM   TRIG 113 12/11/2017 08:37 AM   HDL 38 (L) 12/11/2017 08:37 AM   CHOLHDL 3.9 12/11/2017 08:37 AM   CHOLHDL 4.6 03/22/2016 09:35  AM   LDLCALC 87 12/11/2017 08:37 AM    Wt Readings from Last 3 Encounters:  01/13/19 195 lb (88.5 kg)  08/27/18 225 lb 8 oz (102.3 kg)  08/26/18 225 lb 8 oz (102.3 kg)     Objective:    Vital Signs:  BP 136/81 Comment: right arm  Pulse 81   Ht 5\' 10"  (1.778 m)   Wt 195 lb (88.5 kg)   BMI 27.98 kg/m    VITAL SIGNS:  reviewed GEN:  no acute distress EYES:  sclerae anicteric, EOMI - Extraocular Movements Intact RESPIRATORY:  normal respiratory effort, symmetric expansion CARDIOVASCULAR:  no peripheral edema SKIN:  no rash, lesions or ulcers. MUSCULOSKELETAL:  no obvious deformities. NEURO:  alert and oriented x 3, no obvious focal deficit PSYCH:  normal affect  ASSESSMENT & PLAN:    1. Chest pain: Patient reporting significant substernal chest pain with radiation up to his neck and bilateral jaws that occurred immediately after a mechanical fall from a rolling stool while working in his garage.  Based on timing of chest pain I initially suspected likely musculoskeletal chest wall pain from mechanical fall however he reports that his chest pain is similar in character to his previous angina.  He does have known CAD and has undergone stenting to both LAD and RCA in the past.  PCI was done in 2016.  Nuclear stress test in 2018 showed all large scar but no ischemia.  He was partially seen in the emergency department that evening on 6/20 and had an EKG that showed sinus rhythm with PACs and one troponin that was negative.  He also had a chest x-ray that did not show any acute cardiopulmonary abnormalities and there is no mention in radiologist report of any rib  fractures.  Unfortunately the patient got tired of the long wait in the emergency department and did not stay for physician evaluation.  Since 6/20, he has continued to have significant pain.  Feels like a gas bubble in his chest and hurts to take in a big deep breath.  He does have some chest wall tenderness, further supporting the likelihood of musculoskeletal chest wall pain however given the severity of his symptoms and similar characteristics of his previous angina I have recommended a nuclear stress test to rule out ischemia as well as an echocardiogram to rule out potential for cardiac contusion/ pericardial effusion.  I have also encouraged that the patient try a warm heating pad to his chest wall area and try Tylenol for pain relief.  2.  History of atrial flutter: Recent EKG in the ED 6/20 showed sinus rhythm with PACs.  Heart rate was controlled.  He is prescribed Eliquis for anticoagulation but has had difficulty in recent weeks affording this.  He reports that his co-pay is significantly high and he is in the process of switching insurance companies.  He was told that he could potentially get Eliquis at a much affordable cost if he changed insurance companies.  In the meantime we will work to see if we can get him samples until he can afford to get another Rx filled.  3.  CAD: History of LAD and RCA stent in 2016.  Has had recent chest pain this is well outlined in problem #1.  Will obtain nuclear stress test.  See above for further details.  COVID-19 Education: The signs and symptoms of COVID-19 were discussed with the patient and how to seek care for testing (follow up with PCP or arrange  E-visit). *The importance of social distancing was discussed today.  Time:   Today, I have spent 25  minutes with the patient with telehealth technology discussing the above problems.     Medication Adjustments/Labs and Tests Ordered: Current medicines are reviewed at length with the patient today.   Concerns regarding medicines are outlined above.   Tests Ordered: No orders of the defined types were placed in this encounter.   Medication Changes: No orders of the defined types were placed in this encounter.   Follow Up:  Virtual Visit or In Person in 2 week(s)  Signed, Lyda Jester, PA-C  01/13/2019 10:03 AM    [place]

## 2019-01-16 ENCOUNTER — Telehealth (HOSPITAL_COMMUNITY): Payer: Self-pay | Admitting: *Deleted

## 2019-01-16 NOTE — Telephone Encounter (Signed)
Patient given detailed instructions per Myocardial Perfusion Study Information Sheet for the test on 01/20/19. Patient notified to arrive 15 minutes early and that it is imperative to arrive on time for appointment to keep from having the test rescheduled.  If you need to cancel or reschedule your appointment, please call the office within 24 hours of your appointment. . Patient verbalized understanding. Jezel Basto Jacqueline    

## 2019-01-19 ENCOUNTER — Telehealth (HOSPITAL_COMMUNITY): Payer: Self-pay

## 2019-01-19 NOTE — Telephone Encounter (Signed)
LMTCB COVID prescreening for echo. Left echo appt details per DPR. 

## 2019-01-20 ENCOUNTER — Ambulatory Visit (HOSPITAL_COMMUNITY): Payer: PPO | Attending: Cardiology

## 2019-01-20 ENCOUNTER — Encounter (HOSPITAL_COMMUNITY): Payer: Self-pay

## 2019-01-20 ENCOUNTER — Other Ambulatory Visit: Payer: Self-pay

## 2019-01-20 ENCOUNTER — Other Ambulatory Visit: Payer: Self-pay | Admitting: Family Medicine

## 2019-01-20 ENCOUNTER — Ambulatory Visit (HOSPITAL_BASED_OUTPATIENT_CLINIC_OR_DEPARTMENT_OTHER): Payer: PPO

## 2019-01-20 DIAGNOSIS — R079 Chest pain, unspecified: Secondary | ICD-10-CM

## 2019-01-20 DIAGNOSIS — R0789 Other chest pain: Secondary | ICD-10-CM

## 2019-01-20 DIAGNOSIS — R0600 Dyspnea, unspecified: Secondary | ICD-10-CM

## 2019-01-20 LAB — MYOCARDIAL PERFUSION IMAGING
LV dias vol: 88 mL (ref 62–150)
LV sys vol: 38 mL
Peak HR: 68 {beats}/min
Rest HR: 58 {beats}/min
SDS: 1
SRS: 0
SSS: 1
TID: 1.04

## 2019-01-20 MED ORDER — REGADENOSON 0.4 MG/5ML IV SOLN
0.4000 mg | Freq: Once | INTRAVENOUS | Status: AC
  Administered 2019-01-20: 0.4 mg via INTRAVENOUS

## 2019-01-20 MED ORDER — TECHNETIUM TC 99M TETROFOSMIN IV KIT
31.8000 | PACK | Freq: Once | INTRAVENOUS | Status: AC | PRN
  Administered 2019-01-20: 31.8 via INTRAVENOUS
  Filled 2019-01-20: qty 32

## 2019-01-20 MED ORDER — TECHNETIUM TC 99M TETROFOSMIN IV KIT
9.8000 | PACK | Freq: Once | INTRAVENOUS | Status: AC | PRN
  Administered 2019-01-20: 9.8 via INTRAVENOUS
  Filled 2019-01-20: qty 10

## 2019-01-21 ENCOUNTER — Telehealth: Payer: Self-pay

## 2019-01-21 NOTE — Telephone Encounter (Signed)
Notes recorded by Frederik Schmidt, RN on 01/21/2019 at 11:26 AM EDT  The patient has been notified of the result and verbalized understanding. All questions (if any) were answered.  Rahmir Beever, RN 01/21/2019 11:26 AM

## 2019-01-21 NOTE — Telephone Encounter (Signed)
-----   Message from Isaiah Serge, NP sent at 01/21/2019  6:42 AM EDT ----- I have reviewed both echo and stress test and both are reassuring.  Normal pump action of heart valves stable.  No lack of blood supply on stress test..  He should keep appt with [place] on the 10th.

## 2019-01-21 NOTE — Telephone Encounter (Signed)
Notes recorded by Frederik Schmidt, RN on 01/21/2019 at 10:05 AM EDT  [id number] 7/1  ------

## 2019-01-21 NOTE — Telephone Encounter (Signed)
Dr. Fry please advise on refill. Thanks  

## 2019-01-22 NOTE — Telephone Encounter (Signed)
Call in #[phone] with one rf 

## 2019-01-26 NOTE — Telephone Encounter (Signed)
Done

## 2019-01-30 ENCOUNTER — Ambulatory Visit: Payer: PPO | Admitting: Cardiology

## 2019-02-09 ENCOUNTER — Telehealth: Payer: Self-pay | Admitting: Cardiology

## 2019-02-09 NOTE — Telephone Encounter (Signed)

## 2019-02-10 ENCOUNTER — Other Ambulatory Visit: Payer: Self-pay

## 2019-02-10 ENCOUNTER — Ambulatory Visit (INDEPENDENT_AMBULATORY_CARE_PROVIDER_SITE_OTHER): Payer: PPO | Admitting: Cardiology

## 2019-02-10 ENCOUNTER — Encounter: Payer: Self-pay | Admitting: Cardiology

## 2019-02-10 VITALS — BP 120/70 | HR 63 | Ht 70.0 in | Wt 206.6 lb

## 2019-02-10 DIAGNOSIS — I2510 Atherosclerotic heart disease of native coronary artery without angina pectoris: Secondary | ICD-10-CM

## 2019-02-10 NOTE — Patient Instructions (Signed)
Medication Instructions:  none If you need a refill on your cardiac medications before your next appointment, please call your pharmacy.   Lab work: none If you have labs (blood work) drawn today and your tests are completely normal, you will receive your results only by: . MyChart Message (if you have MyChart) OR . A paper copy in the mail If you have any lab test that is abnormal or we need to change your treatment, we will call you to review the results.  Testing/Procedures: none  Follow-Up: At [facility], you and your health needs are our priority.  As part of our continuing mission to provide you with exceptional heart care, we have created designated Provider Care Teams.  These Care Teams include your primary Cardiologist (physician) and Advanced Practice Providers (APPs -  Physician Assistants and Nurse Practitioners) who all work together to provide you with the care you need, when you need it. You will need a follow up appointment in 6 months.  Please call our office 2 months in advance to schedule this appointment.  You may see Christopher McAlhany, MD or one of the following Advanced Practice Providers on your designated Care Team:   Brittainy Simmons, PA-C Dayna Dunn, PA-C . Michele Lenze, PA-C  Any Other Special Instructions Will Be Listed Below (If Applicable).    

## 2019-02-10 NOTE — Progress Notes (Signed)
02/10/2019 [name]   1941/02/06  [id number]  Primary Physician [name], MD Primary Cardiologist: [name], MD  Electrophysiologist: None   Reason for Visit/CC: f/u for chest pain s/p stress test   HPI:  [name] is a 78 y.o. male who is being seen today for f/u for CP. Review of PMH and description of recent CP is outlined below.   He has a h/o CAD status post DES to the LAD and RCA 10/2014, repeat cath 11/2014 due to dyspnea with stable disease, Brilinta stopped & symptoms improved. Nuclear stress test 12/2016 showed large apical anterior apical and inferior apical scar but no ischemia. Also has hypertension, HLD-intolerant to statins, on Zetia, sleep apnea, sinus node dysfunction and atrial flutter status post pacemaker 12/24/2016. He takes Eliquis for a/c but has had difficulty with cost. Did not tolerate Xarelto in the past. Last echo 01/08/2017 normal LV size and function no valve issues. He is followed by Dr. [name].   Pt called the office 01/13/19 w/ complaints of chest pain since suffering a mechanical fall. He reported falling from a rolling stool onto cement floor in his garage about 3 weeks prior. Landed on bottom and fell backwards. Hit his head and bruised hip. Did not seek medical attention at that time. Since fall he had continued to have on and off chest pain. Was instructed by primary care to contact cardiology. Pt reported pain felt like "when you can't burp." When he tried to eat he had difficulty swallowing and felt like a "gas bubble is  blocking" his food. Pt also complained of shortness of breath at times. Occurs when walking. This started after his fall. Chest pain had been present since fall. If he is busy he does not pay attention to it but pain worsens at night and when he rests. He had neck and jaw pain for a few days. He took 2 NTG but this not help pain. Also took omeprazole with no relief. He took oxycodone and was  eventually able to go to sleep.  Pt was on Eliquis but stopped about 6 weeks prior due to cost. Pt was scheduled for colonoscopy on June 22 but this was postponed due to CP. Pt was advised by triage RN to contact his GI doctor regarding gas bubble feeling and trouble swallowing. Pt was added on to my schedule on 6/23 for cardiac assessment.  Based on timing of chest pain, I initially suspected likely musculoskeletal chest wall pain from mechanical fall however he reported to me  that his chest pain was similar in character to his previous angina. I ordered a NST and 2D Echo. NST showed no ischemia. Low risk study. Echo showed normal LVEF at 60-65%, no valvular dysfunction and no pericardial effusion.   He presents back to clinic today for f/u, his chest pain has completely resolved.  No recurrence.  He also denies any recurrent neck/ jaw pain.  He does have chronic exertional dyspnea however this has been present since 2016 after getting his coronary stents and has not changed or worsened in any way over the years. No resting dyspnea.    Cardiac Studies  2D Echo 01/20/19   1. The left ventricle has normal systolic function with an ejection fraction of 60-65%. The cavity size was normal. There is mildly increased left ventricular wall thickness. Left ventricular diastolic Doppler parameters are consistent with impaired  relaxation.  2. The right ventricle has normal systolic function. The cavity was normal.  There is no increase in right ventricular wall thickness.  3. There is mild mitral annular calcification present.  4. There is dilatation of the ascending aorta measuring 38 mm.   NST 01/20/19  Study Highlights    Nuclear stress EF: 56%.  There was no ST segment deviation noted during stress.  The study is normal.  This is a low risk study.  The left ventricular ejection fraction is normal (55-65%).   Normal resting and stress perfusion. No ischemia or infarction EF 56%      Current Meds  Medication Sig  . apixaban (ELIQUIS) 5 MG TABS tablet Take 1 tablet (5 mg total) by mouth 2 (two) times daily.  . Ascorbic Acid (VITAMIN C PO) Take 4 tablets by mouth daily as needed (flu like symptopms).   [name] aspirin EC 81 MG tablet Take 81 mg by mouth daily.  . clonazePAM (KLONOPIN) 0.5 MG tablet TAKE 1 TABLET BY MOUTH 3 TIMES A DAY AS NEEDED  . diclofenac sodium (VOLTAREN) 1 % GEL Apply 1 application topically daily as needed (ankle pain).  [name] diltiazem (CARDIZEM CD) 120 MG 24 hr capsule Take 1 capsule (120 mg total) by mouth daily.  [name] ezetimibe (ZETIA) 10 MG tablet Take 1 tablet (10 mg total) by mouth daily.  . fluticasone (FLONASE) 50 MCG/ACT nasal spray Place 1 spray into both nostrils daily as needed for allergies or rhinitis.  . furosemide (LASIX) 20 MG tablet Take 1 tablet (20 mg total) by mouth every other day.  . gabapentin (NEURONTIN) 100 MG capsule TAKE 1 CAPSULE BY MOUTH THREE TIMES A DAY  . levothyroxine (SYNTHROID, LEVOTHROID) 175 MCG tablet TAKE 1 TABLET (175 MCG TOTAL) BY MOUTH DAILY BEFORE BREAKFAST.  . methocarbamol (ROBAXIN) 750 MG tablet Take 750 mg by mouth 4 (four) times daily as needed for muscle spasms.   . nitroGLYCERIN (NITROSTAT) 0.4 MG SL tablet Place 1 tablet (0.4 mg total) under the tongue every 5 (five) minutes as needed for chest pain.  [name] omeprazole (PRILOSEC) 20 MG capsule TAKE 1 CAPSULE BY MOUTH TWICE A DAY  . oxycodone (ROXICODONE) 30 MG immediate release tablet Take 1 tablet (30 mg total) by mouth every 6 (six) hours as needed for up to 30 days for pain.  . polyethylene glycol (MIRALAX / GLYCOLAX) packet Take 17 g by mouth daily as needed for mild constipation.  . ropinirole (REQUIP) 5 MG tablet Take 1 tablet (5 mg total) by mouth 2 (two) times a day.   Allergies  Allergen Reactions  . Brilinta [Ticagrelor] Shortness Of Breath  . Metoprolol Tartrate Other (See Comments)    Severe chest pains " flat lined patient"  . Mirapex [Pramipexole  Dihydrochloride]     Severe leg pain  . Shellfish Allergy Anaphylaxis and Hives  . Statins Other (See Comments)    All statins cause myalgias   . Zolpidem Other (See Comments)    Chest pain  . Coconut Oil Hives  . Levaquin [Levofloxacin] Hives  . Trazodone And Nefazodone Other (See Comments)    Unsteady on feet   Past Medical History:  Diagnosis Date  . Adenomatous polyp of colon 2007  . Arthritis   . Atrial flutter ([place])   . AVM (arteriovenous malformation) 2011   a. S/p argon plasma coagulation and ablation in 2011, 2017.  . Bradycardia    a. H/o almost 7sec pause nocturnally during 2011 admission. Also has h/o fatigue with BB.  [name] CAD (coronary artery disease)    a. Nonobst  disease by cath 2009. b. s/p PCI 10/2014 with DES to [place], patent by relook 11/2014 (Brilinta changed to Plavix with improved sx).  . Chronic diastolic CHF (congestive heart failure) ([place])   . Depression   . Diverticulosis   . Gastritis 2011  . GERD (gastroesophageal reflux disease)   . History of hiatal hernia   . Hyperlipidemia   . Hypertension   . Hypothyroidism   . Myocardial infarction ([place])   . Obstructive sleep apnea    -no cpap use  . Premature atrial contractions Holter 2016  . PVC's (premature ventricular contractions) Holter 2016  . Statin intolerance   . Transfusion history    several years ago -GI bleed   Family History  Problem Relation Age of Onset  . Leukemia Mother   . Heart disease Father   . Stroke Father   . Heart attack Father   . Alcoholism Paternal Uncle   . Alcoholism Maternal Grandfather   . Colon cancer Neg Hx   . Esophageal cancer Neg Hx    Past Surgical History:  Procedure Laterality Date  . ANKLE SURGERY Left   . APPENDECTOMY    . CARDIAC CATHETERIZATION N/A 11/26/2014   Procedure: Left Heart Cath and Coronary Angiography;  Surgeon: Burnell Blanks, MD;  Location: [place] [facility];  Service: Cardiovascular;  Laterality: N/A;  . CHOLECYSTECTOMY  N/A 08/23/2016   Procedure: LAPAROSCOPIC CHOLECYSTECTOMY;  Surgeon: Coralie Keens, MD;  Location: [facility];  Service: General;  Laterality: N/A;  . COLONOSCOPY  08-23-05   per Dr. Deatra Ina, adenomatous polyps, repeat in 5 yrs   . COLONOSCOPY WITH PROPOFOL N/A 12/06/2015   Procedure: COLONOSCOPY WITH PROPOFOL;  Surgeon: Doran Stabler, MD;  Location: [facility];  Service: Gastroenterology;  Laterality: N/A;  . ENTEROSCOPY N/A 12/06/2015   Procedure: ENTEROSCOPY;  Surgeon: Doran Stabler, MD;  Location: [facility];  Service: Gastroenterology;  Laterality: N/A;  . ESOPHAGOGASTRODUODENOSCOPY  08-23-05   per Dr. Deatra Ina, cauterized jejunal AVMs   . HERNIA REPAIR    . HOT HEMOSTASIS N/A 12/06/2015   Procedure: HOT HEMOSTASIS (ARGON PLASMA COAGULATION/BICAP);  Surgeon: Doran Stabler, MD;  Location: [facility];  Service: Gastroenterology;  Laterality: N/A;  . LEFT HEART CATHETERIZATION WITH CORONARY ANGIOGRAM N/A 09/08/2012   Procedure: LEFT HEART CATHETERIZATION WITH CORONARY ANGIOGRAM;  Surgeon: Burnell Blanks, MD;  Location: [facility];  Service: Cardiovascular;  Laterality: N/A;  . LEFT HEART CATHETERIZATION WITH CORONARY ANGIOGRAM N/A 11/16/2014   Procedure: LEFT HEART CATHETERIZATION WITH CORONARY ANGIOGRAM;  Surgeon: Leonie Man, MD;  Location: [facility] C[facility];  Service: Cardiovascular;  Laterality: N/A;  . PACEMAKER IMPLANT N/A 12/24/2016   Procedure: Pacemaker Implant;  Surgeon: Deboraha Sprang, MD;  Location: [place] [facility];  Service: Cardiovascular;  Laterality: N/A;  . SKIN GRAFT Right    leg  . TONSILLECTOMY     Social History   Socioeconomic History  . Marital status: Married    Spouse name: Not on file  . Number of children: 1  . Years of education: Not on file  . Highest education level: Not on file  Occupational History  . Occupation: Disabled    Fish farm manager: UNEMPLOYED  Social Needs  . Financial resource strain: Not on file  . Food insecurity    Worry: Not on  file    Inability: Not on file  . Transportation needs    Medical: Not on file    Non-medical: Not on  file  Tobacco Use  . Smoking status: Former Smoker    Packs/day: 2.00    Years: 50.00    Pack years: 100.00    Types: Cigarettes    Quit date: 07/23/2002    Years since quitting: 16.5  . Smokeless tobacco: Never Used  Substance and Sexual Activity  . Alcohol use: No    Alcohol/week: 0.0 standard drinks  . Drug use: No  . Sexual activity: Not on file  Lifestyle  . Physical activity    Days per week: Not on file    Minutes per session: Not on file  . Stress: Not on file  Relationships  . Social Herbalist on phone: Not on file    Gets together: Not on file    Attends religious service: Not on file    Active member of club or organization: Not on file    Attends meetings of clubs or organizations: Not on file    Relationship status: Not on file  . Intimate partner violence    Fear of current or ex partner: Not on file    Emotionally abused: Not on file    Physically abused: Not on file    Forced sexual activity: Not on file  Other Topics Concern  . Not on file  Social History Narrative  . Not on file     Lipid Panel     Component Value Date/Time   CHOL 148 12/11/2017 0837   TRIG 113 12/11/2017 0837   HDL 38 (L) 12/11/2017 0837   CHOLHDL 3.9 12/11/2017 0837   CHOLHDL 4.6 03/22/2016 0935   VLDL 46 (H) 03/22/2016 0935   LDLCALC 87 12/11/2017 0837    Review of Systems: General: negative for chills, fever, night sweats or weight changes.  Cardiovascular: negative for chest pain, dyspnea on exertion, edema, orthopnea, palpitations, paroxysmal nocturnal dyspnea or shortness of breath Dermatological: negative for rash Respiratory: negative for cough or wheezing Urologic: negative for hematuria Abdominal: negative for nausea, vomiting, diarrhea, bright red blood per rectum, melena, or hematemesis Neurologic: negative for visual changes, syncope, or dizziness  All other systems reviewed and are otherwise negative except as noted above.   Physical Exam:  Blood pressure 120/70, pulse 63, height 5\' 10"  (1.778 m), weight 206 lb 9.6 oz (93.7 kg), SpO2 97 %.  General appearance: alert, cooperative and no distress Neck: no carotid bruit and no JVD Lungs: clear to auscultation bilaterally Heart: regular rate and rhythm, S1, S2 normal, no murmur, click, rub or gallop Extremities: extremities normal, atraumatic, no cyanosis or edema Pulses: 2+ and symmetric Skin: Skin color, texture, turgor normal. No rashes or lesions Neurologic: Grossly normal  EKG not performed -- personally reviewed   ASSESSMENT AND PLAN:   1. Chest Pain: f/u for chest pain after mechanical fall from a rolling stool in his garage several weeks ago. I initially suspected likely musculoskeletal chest wall pain from mechanical fall however he reported to me at last visit that his chest pain was similar in character to his previous angina. I ordered a NST and 2D Echo. NST showed no ischemia. Low risk study. Echo showed normal LVEF at 60-65%, no valvular dysfunction and no pericardial effusion. Chest pain ruled noncardiac. Likely musculoskeletal pain. Pt reassured. No further cardiac w/u.   2. CAD: History of LAD and RCA stent in 2016. NST 01/20/19 showed no ischemia. No recurrent CP since his last OV several weeks ago. He also denies any recurrent neck/ jaw pain.  He  does have chronic exertional dyspnea however this has been present since 2016 after getting his coronary stents and has not changed or worsened in any way over the years. No resting dyspnea. He plans to f/u with PCP to see about PFTs and pulmonology referral. Continue medical therapy for CAD. continue ASA. No statin due to intolerance. On Zetia. No  blocker due to fatigue and resting HR in the low 60s.   3. H/o Atrial Flutter: Recent EKG in the ED 6/20 showed sinus rhythm with PACs.  Heart rate controlled at 63 bpm today.  Continue Cardizem. On Eliquis for a/c coagulation.     Follow-Up in 1 yr w/ Dr. Mosetta Putt Ladoris Gene, MHS [facility] 02/10/2019 12:42 PM

## 2019-02-25 ENCOUNTER — Telehealth: Payer: Self-pay | Admitting: Family Medicine

## 2019-02-25 ENCOUNTER — Encounter: Payer: Self-pay | Admitting: Family Medicine

## 2019-02-25 ENCOUNTER — Telehealth: Payer: Self-pay

## 2019-02-25 ENCOUNTER — Other Ambulatory Visit: Payer: Self-pay

## 2019-02-25 ENCOUNTER — Telehealth (INDEPENDENT_AMBULATORY_CARE_PROVIDER_SITE_OTHER): Payer: PPO | Admitting: Family Medicine

## 2019-02-25 DIAGNOSIS — M5440 Lumbago with sciatica, unspecified side: Secondary | ICD-10-CM

## 2019-02-25 DIAGNOSIS — F1190 Opioid use, unspecified, uncomplicated: Secondary | ICD-10-CM

## 2019-02-25 MED ORDER — OXYCODONE HCL 30 MG PO TABS: 30.0000 mg | ORAL_TABLET | Freq: Four times a day (QID) | ORAL | 0 refills | Status: DC | PRN

## 2019-02-25 NOTE — Telephone Encounter (Signed)
Copied from [place] [phone]. Topic: Appointment Scheduling - Scheduling Inquiry for Clinic >> Feb 25, 2019  9:16 AM Scherrie Gerlach wrote: Reason for CRM: pt needs pain management appt asap.  Unable to reach office.  Pt was hoping for the 10 am today with Dr Sarajane Jews, virtual. He is going out of town. >> Feb 25, 2019 11:13 AM Cox, Melburn Hake, CMA wrote: Called pt to offer virtual appt tomorrow, he declined and states they are going out of town and he really needs to have visit today as he doesn't have enough meds to take with him to get him through the weekend.  Ria Comment - can you squeeze him in somewhere this afternoon for a virtual PMV?

## 2019-02-25 NOTE — Telephone Encounter (Signed)
Pt request refill  oxycodone (ROXICODONE) 30 MG immediate release tablet  Pt last visit 11/26/18. Pt due for OV.  Unable to reach the office after multiple attempts to schedule pt.  Pt states they are leaving to go out of town today, and he needs today.  [id number] - [place], [place] - [phone] (Phone) [phone] (Fax)

## 2019-02-25 NOTE — Telephone Encounter (Signed)
Patient has scheduled for 3:15 pm today, 02/25/2019. LM to inform patient.  CRM created.

## 2019-02-25 NOTE — Telephone Encounter (Signed)
Last filled 01/25/2019 Last PMV 11/25/2018  Ok to fill?

## 2019-02-25 NOTE — Progress Notes (Signed)
Virtual Visit via Video Note  I connected with the patient on 02/25/19 at  3:15 PM EDT by a video enabled telemedicine application and verified that I am speaking with the correct person using two identifiers.  Location patient: home Location provider:work or home office Persons participating in the virtual visit: patient, provider  I discussed the limitations of evaluation and management by telemedicine and the availability of in person appointments. The patient expressed understanding and agreed to proceed.   HPI: Here for pain management. He is doing well.  Indication for chronic opioid: low back pain  Medication and dose: Oxycodone 30 mg # pills per month: 120 Last UDS date: 08-26-18 Opioid Treatment Agreement signed (Y/N): 08-26-17 Opioid Treatment Agreement last reviewed with patient:  02-25-19 NCCSRS reviewed this encounter (include red flags): Yes    ROS: See pertinent positives and negatives per HPI.  Past Medical History:  Diagnosis Date  . Adenomatous polyp of colon 2007  . Arthritis   . Atrial flutter ([place])   . AVM (arteriovenous malformation) 2011   a. S/p argon plasma coagulation and ablation in 2011, 2017.  . Bradycardia    a. H/o almost 7sec pause nocturnally during 2011 admission. Also has h/o fatigue with BB.  [name] CAD (coronary artery disease)    a. Nonobst disease by cath 2009. b. s/p PCI 10/2014 with DES to [place], patent by relook 11/2014 (Brilinta changed to Plavix with improved sx).  . Chronic diastolic CHF (congestive heart failure) ([place])   . Depression   . Diverticulosis   . Gastritis 2011  . GERD (gastroesophageal reflux disease)   . History of hiatal hernia   . Hyperlipidemia   . Hypertension   . Hypothyroidism   . Myocardial infarction ([place])   . Obstructive sleep apnea    -no cpap use  . Premature atrial contractions Holter 2016  . PVC's (premature ventricular contractions) Holter 2016  . Statin intolerance   . Transfusion history    several  years ago -GI bleed    Past Surgical History:  Procedure Laterality Date  . ANKLE SURGERY Left   . APPENDECTOMY    . CARDIAC CATHETERIZATION N/A 11/26/2014   Procedure: Left Heart Cath and Coronary Angiography;  Surgeon: Burnell Blanks, MD;  Location: [place] [facility];  Service: Cardiovascular;  Laterality: N/A;  . CHOLECYSTECTOMY N/A 08/23/2016   Procedure: LAPAROSCOPIC CHOLECYSTECTOMY;  Surgeon: Coralie Keens, MD;  Location: [place];  Service: General;  Laterality: N/A;  . COLONOSCOPY  08-23-05   per Dr. Deatra Ina, adenomatous polyps, repeat in 5 yrs   . COLONOSCOPY WITH PROPOFOL N/A 12/06/2015   Procedure: COLONOSCOPY WITH PROPOFOL;  Surgeon: Doran Stabler, MD;  Location: [facility];  Service: Gastroenterology;  Laterality: N/A;  . ENTEROSCOPY N/A 12/06/2015   Procedure: ENTEROSCOPY;  Surgeon: Doran Stabler, MD;  Location: [facility];  Service: Gastroenterology;  Laterality: N/A;  . ESOPHAGOGASTRODUODENOSCOPY  08-23-05   per Dr. Deatra Ina, cauterized jejunal AVMs   . HERNIA REPAIR    . HOT HEMOSTASIS N/A 12/06/2015   Procedure: HOT HEMOSTASIS (ARGON PLASMA COAGULATION/BICAP);  Surgeon: Doran Stabler, MD;  Location: [facility];  Service: Gastroenterology;  Laterality: N/A;  . LEFT HEART CATHETERIZATION WITH CORONARY ANGIOGRAM N/A 09/08/2012   Procedure: LEFT HEART CATHETERIZATION WITH CORONARY ANGIOGRAM;  Surgeon: Burnell Blanks, MD;  Location: [facility] LAB;  Service: Cardiovascular;  Laterality: N/A;  . LEFT HEART CATHETERIZATION WITH CORONARY ANGIOGRAM N/A 11/16/2014   Procedure: LEFT HEART  CATHETERIZATION WITH CORONARY ANGIOGRAM;  Surgeon: Leonie Man, MD;  Location: [facility] C[facility] LAB;  Service: Cardiovascular;  Laterality: N/A;  . PACEMAKER IMPLANT N/A 12/24/2016   Procedure: Pacemaker Implant;  Surgeon: Deboraha Sprang, MD;  Location: [place] [facility];  Service: Cardiovascular;  Laterality: N/A;  . SKIN GRAFT Right    leg  . TONSILLECTOMY      Family History   Problem Relation Age of Onset  . Leukemia Mother   . Heart disease Father   . Stroke Father   . Heart attack Father   . Alcoholism Paternal Uncle   . Alcoholism Maternal Grandfather   . Colon cancer Neg Hx   . Esophageal cancer Neg Hx      Current Outpatient Medications:  .  apixaban (ELIQUIS) 5 MG TABS tablet, Take 1 tablet (5 mg total) by mouth 2 (two) times daily., Disp: 60 tablet, Rfl: 0 .  Ascorbic Acid (VITAMIN C PO), Take 4 tablets by mouth daily as needed (flu like symptopms). , Disp: , Rfl:  .  aspirin EC 81 MG tablet, Take 81 mg by mouth daily., Disp: , Rfl:  .  clonazePAM (KLONOPIN) 0.5 MG tablet, TAKE 1 TABLET BY MOUTH 3 TIMES A DAY AS NEEDED, Disp: 270 tablet, Rfl: 1 .  diclofenac sodium (VOLTAREN) 1 % GEL, Apply 1 application topically daily as needed (ankle pain)., Disp: 5 Tube, Rfl: 10 .  diltiazem (CARDIZEM CD) 120 MG 24 hr capsule, Take 1 capsule (120 mg total) by mouth daily., Disp: 90 capsule, Rfl: 3 .  ezetimibe (ZETIA) 10 MG tablet, Take 1 tablet (10 mg total) by mouth daily., Disp: 90 tablet, Rfl: 3 .  fluticasone (FLONASE) 50 MCG/ACT nasal spray, Place 1 spray into both nostrils daily as needed for allergies or rhinitis., Disp: 48 g, Rfl: 3 .  furosemide (LASIX) 20 MG tablet, Take 1 tablet (20 mg total) by mouth every other day., Disp: 15 tablet, Rfl: 10 .  gabapentin (NEURONTIN) 100 MG capsule, TAKE 1 CAPSULE BY MOUTH THREE TIMES A DAY, Disp: 270 capsule, Rfl: 1 .  levothyroxine (SYNTHROID, LEVOTHROID) 175 MCG tablet, TAKE 1 TABLET (175 MCG TOTAL) BY MOUTH DAILY BEFORE BREAKFAST., Disp: 90 tablet, Rfl: 2 .  methocarbamol (ROBAXIN) 750 MG tablet, Take 750 mg by mouth 4 (four) times daily as needed for muscle spasms. , Disp: , Rfl:  .  nitroGLYCERIN (NITROSTAT) 0.4 MG SL tablet, Place 1 tablet (0.4 mg total) under the tongue every 5 (five) minutes as needed for chest pain., Disp: 25 tablet, Rfl: 12 .  omeprazole (PRILOSEC) 20 MG capsule, TAKE 1 CAPSULE BY MOUTH  TWICE A DAY, Disp: 60 capsule, Rfl: 11 .  [START ON 04/27/2019] oxycodone (ROXICODONE) 30 MG immediate release tablet, Take 1 tablet (30 mg total) by mouth every 6 (six) hours as needed for pain., Disp: 120 tablet, Rfl: 0 .  polyethylene glycol (MIRALAX / GLYCOLAX) packet, Take 17 g by mouth daily as needed for mild constipation., Disp: , Rfl:  .  ropinirole (REQUIP) 5 MG tablet, Take 1 tablet (5 mg total) by mouth 2 (two) times a day., Disp: 180 tablet, Rfl: 3  EXAM:  VITALS per patient if applicable:  GENERAL: alert, oriented, appears well and in no acute distress  HEENT: atraumatic, conjunttiva clear, no obvious abnormalities on inspection of external nose and ears  NECK: normal movements of the head and neck  LUNGS: on inspection no signs of respiratory distress, breathing rate appears normal, no obvious gross SOB,  gasping or wheezing  CV: no obvious cyanosis  MS: moves all visible extremities without noticeable abnormality  PSYCH/NEURO: pleasant and cooperative, no obvious depression or anxiety, speech and thought processing grossly intact  ASSESSMENT AND PLAN: Pain management, meds were refilled.  [name], MD  Discussed the following assessment and plan:  No diagnosis found.     I discussed the assessment and treatment plan with the patient. The patient was provided an opportunity to ask questions and all were answered. The patient agreed with the plan and demonstrated an understanding of the instructions.   The patient was advised to call back or seek an in-person evaluation if the symptoms worsen or if the condition fails to improve as anticipated.

## 2019-02-25 NOTE — Telephone Encounter (Signed)
See RX request

## 2019-04-01 ENCOUNTER — Ambulatory Visit (INDEPENDENT_AMBULATORY_CARE_PROVIDER_SITE_OTHER): Payer: PPO | Admitting: *Deleted

## 2019-04-01 DIAGNOSIS — I495 Sick sinus syndrome: Secondary | ICD-10-CM

## 2019-04-01 DIAGNOSIS — I4892 Unspecified atrial flutter: Secondary | ICD-10-CM

## 2019-04-03 ENCOUNTER — Other Ambulatory Visit: Payer: Self-pay | Admitting: Family Medicine

## 2019-04-04 LAB — CUP PACEART REMOTE DEVICE CHECK
Battery Remaining Longevity: 151 mo
Battery Voltage: 3.02 V
Brady Statistic AP VP Percent: 4.25 %
Brady Statistic AP VS Percent: 18.47 %
Brady Statistic AS VP Percent: 7.98 %
Brady Statistic AS VS Percent: 69.3 %
Brady Statistic RA Percent Paced: 24.13 %
Brady Statistic RV Percent Paced: 12.22 %
Date Time Interrogation Session: 20200911130823
Implantable Lead Implant Date: 20180604
Implantable Lead Implant Date: 20180604
Implantable Lead Location: 753859
Implantable Lead Location: 753860
Implantable Lead Model: 5076
Implantable Lead Model: 5076
Implantable Pulse Generator Implant Date: 20180604
Lead Channel Impedance Value: 304 Ohm
Lead Channel Impedance Value: 361 Ohm
Lead Channel Impedance Value: 418 Ohm
Lead Channel Impedance Value: 456 Ohm
Lead Channel Pacing Threshold Amplitude: 0.75 V
Lead Channel Pacing Threshold Amplitude: 0.75 V
Lead Channel Pacing Threshold Pulse Width: 0.4 ms
Lead Channel Pacing Threshold Pulse Width: 0.4 ms
Lead Channel Sensing Intrinsic Amplitude: 1.625 mV
Lead Channel Sensing Intrinsic Amplitude: 1.625 mV
Lead Channel Sensing Intrinsic Amplitude: 4.25 mV
Lead Channel Sensing Intrinsic Amplitude: 4.25 mV
Lead Channel Setting Pacing Amplitude: 1.75 V
Lead Channel Setting Pacing Amplitude: 2.5 V
Lead Channel Setting Pacing Pulse Width: 0.4 ms
Lead Channel Setting Sensing Sensitivity: 1.2 mV

## 2019-04-16 NOTE — Progress Notes (Signed)
Remote pacemaker transmission.   

## 2019-05-07 ENCOUNTER — Other Ambulatory Visit: Payer: Self-pay | Admitting: Family Medicine

## 2019-05-08 ENCOUNTER — Other Ambulatory Visit: Payer: Self-pay

## 2019-05-08 ENCOUNTER — Telehealth (INDEPENDENT_AMBULATORY_CARE_PROVIDER_SITE_OTHER): Payer: PPO | Admitting: Family Medicine

## 2019-05-08 ENCOUNTER — Encounter: Payer: Self-pay | Admitting: Family Medicine

## 2019-05-08 DIAGNOSIS — M7062 Trochanteric bursitis, left hip: Secondary | ICD-10-CM | POA: Diagnosis not present

## 2019-05-08 MED ORDER — METHYLPREDNISOLONE 4 MG PO TBPK: ORAL_TABLET | ORAL | 0 refills | Status: DC

## 2019-05-08 NOTE — Progress Notes (Signed)
Virtual Visit via Video Note  I connected with the patient on 05/08/19 at  8:45 AM EDT by a video enabled telemedicine application and verified that I am speaking with the correct person using two identifiers.  Location patient: home Location provider:work or home office Persons participating in the virtual visit: patient, provider  I discussed the limitations of evaluation and management by telemedicine and the availability of in person appointments. The patient expressed understanding and agreed to proceed.   HPI: Here for one month of a constant pain in the left hip. He points to the lateral hip as the source of the pain. No radiation to the leg. Heat and Ibuprofen help a little.    ROS: See pertinent positives and negatives per HPI.  Past Medical History:  Diagnosis Date  . Adenomatous polyp of colon 2007  . Arthritis   . Atrial flutter ([place])   . AVM (arteriovenous malformation) 2011   a. S/p argon plasma coagulation and ablation in 2011, 2017.  . Bradycardia    a. H/o almost 7sec pause nocturnally during 2011 admission. Also has h/o fatigue with BB.  [name] CAD (coronary artery disease)    a. Nonobst disease by cath 2009. b. s/p PCI 10/2014 with DES to [place], patent by relook 11/2014 (Brilinta changed to Plavix with improved sx).  . Chronic diastolic CHF (congestive heart failure) ([place])   . Depression   . Diverticulosis   . Gastritis 2011  . GERD (gastroesophageal reflux disease)   . History of hiatal hernia   . Hyperlipidemia   . Hypertension   . Hypothyroidism   . Myocardial infarction ([place])   . Obstructive sleep apnea    -no cpap use  . Premature atrial contractions Holter 2016  . PVC's (premature ventricular contractions) Holter 2016  . Statin intolerance   . Transfusion history    several years ago -GI bleed    Past Surgical History:  Procedure Laterality Date  . ANKLE SURGERY Left   . APPENDECTOMY    . CARDIAC CATHETERIZATION N/A 11/26/2014   Procedure:  Left Heart Cath and Coronary Angiography;  Surgeon: Burnell Blanks, MD;  Location: [facility];  Service: Cardiovascular;  Laterality: N/A;  . CHOLECYSTECTOMY N/A 08/23/2016   Procedure: LAPAROSCOPIC CHOLECYSTECTOMY;  Surgeon: Coralie Keens, MD;  Location: [facility];  Service: General;  Laterality: N/A;  . COLONOSCOPY  08-23-05   per Dr. Deatra Ina, adenomatous polyps, repeat in 5 yrs   . COLONOSCOPY WITH PROPOFOL N/A 12/06/2015   Procedure: COLONOSCOPY WITH PROPOFOL;  Surgeon: Doran Stabler, MD;  Location: [facility];  Service: Gastroenterology;  Laterality: N/A;  . ENTEROSCOPY N/A 12/06/2015   Procedure: ENTEROSCOPY;  Surgeon: Doran Stabler, MD;  Location: [facility];  Service: Gastroenterology;  Laterality: N/A;  . ESOPHAGOGASTRODUODENOSCOPY  08-23-05   per Dr. Deatra Ina, cauterized jejunal AVMs   . HERNIA REPAIR    . HOT HEMOSTASIS N/A 12/06/2015   Procedure: HOT HEMOSTASIS (ARGON PLASMA COAGULATION/BICAP);  Surgeon: Doran Stabler, MD;  Location: [facility];  Service: Gastroenterology;  Laterality: N/A;  . LEFT HEART CATHETERIZATION WITH CORONARY ANGIOGRAM N/A 09/08/2012   Procedure: LEFT HEART CATHETERIZATION WITH CORONARY ANGIOGRAM;  Surgeon: Burnell Blanks, MD;  Location: [facility];  Service: Cardiovascular;  Laterality: N/A;  . LEFT HEART CATHETERIZATION WITH CORONARY ANGIOGRAM N/A 11/16/2014   Procedure: LEFT HEART CATHETERIZATION WITH CORONARY ANGIOGRAM;  Surgeon: Leonie Man, MD;  Location: [facility] C[facility] LAB;  Service: Cardiovascular;  Laterality:  N/A;  . PACEMAKER IMPLANT N/A 12/24/2016   Procedure: Pacemaker Implant;  Surgeon: Deboraha Sprang, MD;  Location: [place];  Service: Cardiovascular;  Laterality: N/A;  . SKIN GRAFT Right    leg  . TONSILLECTOMY      Family History  Problem Relation Age of Onset  . Leukemia Mother   . Heart disease Father   . Stroke Father   . Heart attack Father   . Alcoholism Paternal Uncle   . Alcoholism Maternal  Grandfather   . Colon cancer Neg Hx   . Esophageal cancer Neg Hx      Current Outpatient Medications:  .  apixaban (ELIQUIS) 5 MG TABS tablet, Take 1 tablet (5 mg total) by mouth 2 (two) times daily., Disp: 60 tablet, Rfl: 0 .  Ascorbic Acid (VITAMIN C PO), Take 4 tablets by mouth daily as needed (flu like symptopms). , Disp: , Rfl:  .  aspirin EC 81 MG tablet, Take 81 mg by mouth daily., Disp: , Rfl:  .  clonazePAM (KLONOPIN) 0.5 MG tablet, TAKE 1 TABLET BY MOUTH 3 TIMES A DAY AS NEEDED, Disp: 270 tablet, Rfl: 1 .  diclofenac sodium (VOLTAREN) 1 % GEL, Apply 1 application topically daily as needed (ankle pain)., Disp: 5 Tube, Rfl: 10 .  diltiazem (CARDIZEM CD) 120 MG 24 hr capsule, Take 1 capsule (120 mg total) by mouth daily., Disp: 90 capsule, Rfl: 3 .  ezetimibe (ZETIA) 10 MG tablet, Take 1 tablet (10 mg total) by mouth daily., Disp: 90 tablet, Rfl: 3 .  fluticasone (FLONASE) 50 MCG/ACT nasal spray, Place 1 spray into both nostrils daily as needed for allergies or rhinitis., Disp: 48 g, Rfl: 3 .  furosemide (LASIX) 20 MG tablet, Take 1 tablet (20 mg total) by mouth every other day., Disp: 15 tablet, Rfl: 10 .  gabapentin (NEURONTIN) 100 MG capsule, TAKE 1 CAPSULE BY MOUTH THREE TIMES A DAY, Disp: 270 capsule, Rfl: 1 .  levothyroxine (SYNTHROID) 175 MCG tablet, TAKE 1 TABLET (175 MCG TOTAL) BY MOUTH DAILY BEFORE BREAKFAST., Disp: 90 tablet, Rfl: 2 .  methocarbamol (ROBAXIN) 750 MG tablet, Take 750 mg by mouth 4 (four) times daily as needed for muscle spasms. , Disp: , Rfl:  .  nitroGLYCERIN (NITROSTAT) 0.4 MG SL tablet, Place 1 tablet (0.4 mg total) under the tongue every 5 (five) minutes as needed for chest pain., Disp: 25 tablet, Rfl: 12 .  omeprazole (PRILOSEC) 20 MG capsule, TAKE 1 CAPSULE BY MOUTH TWICE A DAY, Disp: 180 capsule, Rfl: 3 .  oxycodone (ROXICODONE) 30 MG immediate release tablet, Take 1 tablet (30 mg total) by mouth every 6 (six) hours as needed for pain., Disp: 120 tablet,  Rfl: 0 .  polyethylene glycol (MIRALAX / GLYCOLAX) packet, Take 17 g by mouth daily as needed for mild constipation., Disp: , Rfl:  .  ropinirole (REQUIP) 5 MG tablet, Take 1 tablet (5 mg total) by mouth 2 (two) times a day., Disp: 180 tablet, Rfl: 3  EXAM:  VITALS per patient if applicable:  GENERAL: alert, oriented, appears well and in no acute distress  HEENT: atraumatic, conjunttiva clear, no obvious abnormalities on inspection of external nose and ears  NECK: normal movements of the head and neck  LUNGS: on inspection no signs of respiratory distress, breathing rate appears normal, no obvious gross SOB, gasping or wheezing  CV: no obvious cyanosis  MS: moves all visible extremities without noticeable abnormality  PSYCH/NEURO: pleasant and cooperative, no obvious  depression or anxiety, speech and thought processing grossly intact  ASSESSMENT AND PLAN: This sounds like bursitis. He will take a Medrol dose pack. Recheck prn.  [name], MD  Discussed the following assessment and plan:  No diagnosis found.     I discussed the assessment and treatment plan with the patient. The patient was provided an opportunity to ask questions and all were answered. The patient agreed with the plan and demonstrated an understanding of the instructions.   The patient was advised to call back or seek an in-person evaluation if the symptoms worsen or if the condition fails to improve as anticipated.

## 2019-05-21 ENCOUNTER — Telehealth (INDEPENDENT_AMBULATORY_CARE_PROVIDER_SITE_OTHER): Payer: PPO | Admitting: Family Medicine

## 2019-05-21 ENCOUNTER — Encounter: Payer: Self-pay | Admitting: Family Medicine

## 2019-05-21 ENCOUNTER — Other Ambulatory Visit: Payer: Self-pay

## 2019-05-21 DIAGNOSIS — F418 Other specified anxiety disorders: Secondary | ICD-10-CM | POA: Insufficient documentation

## 2019-05-21 DIAGNOSIS — F1190 Opioid use, unspecified, uncomplicated: Secondary | ICD-10-CM

## 2019-05-21 DIAGNOSIS — M5440 Lumbago with sciatica, unspecified side: Secondary | ICD-10-CM | POA: Diagnosis not present

## 2019-05-21 MED ORDER — CLONAZEPAM 1 MG PO TABS: 1.0000 mg | ORAL_TABLET | Freq: Three times a day (TID) | ORAL | 5 refills | Status: DC | PRN

## 2019-05-21 MED ORDER — SERTRALINE HCL 50 MG PO TABS: 50.0000 mg | ORAL_TABLET | Freq: Every day | ORAL | 3 refills | Status: DC

## 2019-05-21 MED ORDER — OXYCODONE HCL 30 MG PO TABS: 30.0000 mg | ORAL_TABLET | Freq: Four times a day (QID) | ORAL | 0 refills | Status: DC | PRN

## 2019-05-21 NOTE — Progress Notes (Signed)
Virtual Visit via Video Note  I connected with the patient on 05/21/19 at 11:30 AM EDT by a video enabled telemedicine application and verified that I am speaking with the correct person using two identifiers.  Location patient: home Location provider:work or home office Persons participating in the virtual visit: patient, provider  I discussed the limitations of evaluation and management by telemedicine and the availability of in person appointments. The patient expressed understanding and agreed to proceed.   HPI: Here for pain management. His pain has been stable. He also describes being very depressed and anxious lately. He has been dealing with a lot of family stress lately and he feels sad and hopeless a times. He denies any suicidal thoughts.  Indication for chronic opioid: low back pain Medication and dose: Oxycodone 30 mg  # pills per month: 120 Last UDS date: 08-26-18 Opioid Treatment Agreement signed (Y/N): 08-26-17 Opioid Treatment Agreement last reviewed with patient:  05-21-19 [facility] reviewed this encounter (include red flags): Yes    ROS: See pertinent positives and negatives per HPI.  Past Medical History:  Diagnosis Date  . Adenomatous polyp of colon 2007  . Arthritis   . Atrial flutter ([place])   . AVM (arteriovenous malformation) 2011   a. S/p argon plasma coagulation and ablation in 2011, 2017.  . Bradycardia    a. H/o almost 7sec pause nocturnally during 2011 admission. Also has h/o fatigue with BB.  [name] CAD (coronary artery disease)    a. Nonobst disease by cath 2009. b. s/p PCI 10/2014 with DES to [place], patent by relook 11/2014 (Brilinta changed to Plavix with improved sx).  . Chronic diastolic CHF (congestive heart failure) ([place])   . Depression   . Diverticulosis   . Gastritis 2011  . GERD (gastroesophageal reflux disease)   . History of hiatal hernia   . Hyperlipidemia   . Hypertension   . Hypothyroidism   . Myocardial infarction ([place])   .  Obstructive sleep apnea    -no cpap use  . Premature atrial contractions Holter 2016  . PVC's (premature ventricular contractions) Holter 2016  . Statin intolerance   . Transfusion history    several years ago -GI bleed    Past Surgical History:  Procedure Laterality Date  . ANKLE SURGERY Left   . APPENDECTOMY    . CARDIAC CATHETERIZATION N/A 11/26/2014   Procedure: Left Heart Cath and Coronary Angiography;  Surgeon: Burnell Blanks, MD;  Location: [facility];  Service: Cardiovascular;  Laterality: N/A;  . CHOLECYSTECTOMY N/A 08/23/2016   Procedure: LAPAROSCOPIC CHOLECYSTECTOMY;  Surgeon: Coralie Keens, MD;  Location: [facility];  Service: General;  Laterality: N/A;  . COLONOSCOPY  08-23-05   per Dr. Deatra Ina, adenomatous polyps, repeat in 5 yrs   . COLONOSCOPY WITH PROPOFOL N/A 12/06/2015   Procedure: COLONOSCOPY WITH PROPOFOL;  Surgeon: Doran Stabler, MD;  Location: [facility];  Service: Gastroenterology;  Laterality: N/A;  . ENTEROSCOPY N/A 12/06/2015   Procedure: ENTEROSCOPY;  Surgeon: Doran Stabler, MD;  Location: [facility];  Service: Gastroenterology;  Laterality: N/A;  . ESOPHAGOGASTRODUODENOSCOPY  08-23-05   per Dr. Deatra Ina, cauterized jejunal AVMs   . HERNIA REPAIR    . HOT HEMOSTASIS N/A 12/06/2015   Procedure: HOT HEMOSTASIS (ARGON PLASMA COAGULATION/BICAP);  Surgeon: Doran Stabler, MD;  Location: [facility];  Service: Gastroenterology;  Laterality: N/A;  . LEFT HEART CATHETERIZATION WITH CORONARY ANGIOGRAM N/A 09/08/2012   Procedure: LEFT HEART CATHETERIZATION WITH CORONARY  ANGIOGRAM;  Surgeon: Burnell Blanks, MD;  Location: [facility] CATH LAB;  Service: Cardiovascular;  Laterality: N/A;  . LEFT HEART CATHETERIZATION WITH CORONARY ANGIOGRAM N/A 11/16/2014   Procedure: LEFT HEART CATHETERIZATION WITH CORONARY ANGIOGRAM;  Surgeon: Leonie Man, MD;  Location: [facility] C[facility] LAB;  Service: Cardiovascular;  Laterality: N/A;  . PACEMAKER IMPLANT N/A 12/24/2016    Procedure: Pacemaker Implant;  Surgeon: Deboraha Sprang, MD;  Location: [place] [facility];  Service: Cardiovascular;  Laterality: N/A;  . SKIN GRAFT Right    leg  . TONSILLECTOMY      Family History  Problem Relation Age of Onset  . Leukemia Mother   . Heart disease Father   . Stroke Father   . Heart attack Father   . Alcoholism Paternal Uncle   . Alcoholism Maternal Grandfather   . Colon cancer Neg Hx   . Esophageal cancer Neg Hx      Current Outpatient Medications:  .  apixaban (ELIQUIS) 5 MG TABS tablet, Take 1 tablet (5 mg total) by mouth 2 (two) times daily., Disp: 60 tablet, Rfl: 0 .  Ascorbic Acid (VITAMIN C PO), Take 4 tablets by mouth daily as needed (flu like symptopms). , Disp: , Rfl:  .  aspirin EC 81 MG tablet, Take 81 mg by mouth daily., Disp: , Rfl:  .  clonazePAM (KLONOPIN) 1 MG tablet, Take 1 tablet (1 mg total) by mouth 3 (three) times daily as needed for anxiety., Disp: 90 tablet, Rfl: 5 .  diclofenac sodium (VOLTAREN) 1 % GEL, Apply 1 application topically daily as needed (ankle pain)., Disp: 5 Tube, Rfl: 10 .  diltiazem (CARDIZEM CD) 120 MG 24 hr capsule, Take 1 capsule (120 mg total) by mouth daily., Disp: 90 capsule, Rfl: 3 .  ezetimibe (ZETIA) 10 MG tablet, Take 1 tablet (10 mg total) by mouth daily., Disp: 90 tablet, Rfl: 3 .  fluticasone (FLONASE) 50 MCG/ACT nasal spray, Place 1 spray into both nostrils daily as needed for allergies or rhinitis., Disp: 48 g, Rfl: 3 .  furosemide (LASIX) 20 MG tablet, Take 1 tablet (20 mg total) by mouth every other day., Disp: 15 tablet, Rfl: 10 .  gabapentin (NEURONTIN) 100 MG capsule, TAKE 1 CAPSULE BY MOUTH THREE TIMES A DAY, Disp: 270 capsule, Rfl: 1 .  levothyroxine (SYNTHROID) 175 MCG tablet, TAKE 1 TABLET (175 MCG TOTAL) BY MOUTH DAILY BEFORE BREAKFAST., Disp: 90 tablet, Rfl: 2 .  methocarbamol (ROBAXIN) 750 MG tablet, Take 750 mg by mouth 4 (four) times daily as needed for muscle spasms. , Disp: , Rfl:  .   methylPREDNISolone (MEDROL DOSEPAK) 4 MG TBPK tablet, As directed, Disp: 21 tablet, Rfl: 0 .  nitroGLYCERIN (NITROSTAT) 0.4 MG SL tablet, Place 1 tablet (0.4 mg total) under the tongue every 5 (five) minutes as needed for chest pain., Disp: 25 tablet, Rfl: 12 .  omeprazole (PRILOSEC) 20 MG capsule, TAKE 1 CAPSULE BY MOUTH TWICE A DAY, Disp: 180 capsule, Rfl: 3 .  [START ON 07/27/2019] oxycodone (ROXICODONE) 30 MG immediate release tablet, Take 1 tablet (30 mg total) by mouth every 6 (six) hours as needed for pain., Disp: 120 tablet, Rfl: 0 .  polyethylene glycol (MIRALAX / GLYCOLAX) packet, Take 17 g by mouth daily as needed for mild constipation., Disp: , Rfl:  .  ropinirole (REQUIP) 5 MG tablet, Take 1 tablet (5 mg total) by mouth 2 (two) times a day., Disp: 180 tablet, Rfl: 3 .  sertraline (ZOLOFT) 50 MG tablet,  Take 1 tablet (50 mg total) by mouth daily., Disp: 30 tablet, Rfl: 3  EXAM:  VITALS per patient if applicable:  GENERAL: alert, oriented, appears well and in no acute distress  HEENT: atraumatic, conjunttiva clear, no obvious abnormalities on inspection of external nose and ears  NECK: normal movements of the head and neck  LUNGS: on inspection no signs of respiratory distress, breathing rate appears normal, no obvious gross SOB, gasping or wheezing  CV: no obvious cyanosis  MS: moves all visible extremities without noticeable abnormality  PSYCH/NEURO: pleasant and cooperative, no obvious depression or anxiety, speech and thought processing grossly intact  ASSESSMENT AND PLAN: Pain management, meds were refilled. For the depression and anxiety, he will start on Zoloft 50 mg daily. We will increase the Clonazepam to 1 mg to use as needed. Recheck in 3-4 weeks.  [name], MD  Discussed the following assessment and plan:  No diagnosis found.     I discussed the assessment and treatment plan with the patient. The patient was provided an opportunity to ask questions and all  were answered. The patient agreed with the plan and demonstrated an understanding of the instructions.   The patient was advised to call back or seek an in-person evaluation if the symptoms worsen or if the condition fails to improve as anticipated.

## 2019-06-13 ENCOUNTER — Other Ambulatory Visit: Payer: Self-pay | Admitting: Family Medicine

## 2019-06-16 NOTE — Telephone Encounter (Signed)
Okay for 90 day 

## 2019-07-01 ENCOUNTER — Ambulatory Visit (INDEPENDENT_AMBULATORY_CARE_PROVIDER_SITE_OTHER): Payer: PPO | Admitting: *Deleted

## 2019-07-01 DIAGNOSIS — I495 Sick sinus syndrome: Secondary | ICD-10-CM

## 2019-07-01 LAB — CUP PACEART REMOTE DEVICE CHECK
Battery Remaining Longevity: 149 mo
Battery Voltage: 3.02 V
Brady Statistic AP VP Percent: 3.51 %
Brady Statistic AP VS Percent: 19.46 %
Brady Statistic AS VP Percent: 8.16 %
Brady Statistic AS VS Percent: 68.87 %
Brady Statistic RA Percent Paced: 25.48 %
Brady Statistic RV Percent Paced: 11.67 %
Date Time Interrogation Session: 20201208235616
Implantable Lead Implant Date: 20180604
Implantable Lead Implant Date: 20180604
Implantable Lead Location: 753859
Implantable Lead Location: 753860
Implantable Lead Model: 5076
Implantable Lead Model: 5076
Implantable Pulse Generator Implant Date: 20180604
Lead Channel Impedance Value: 323 Ohm
Lead Channel Impedance Value: 418 Ohm
Lead Channel Impedance Value: 475 Ohm
Lead Channel Impedance Value: 475 Ohm
Lead Channel Pacing Threshold Amplitude: 0.75 V
Lead Channel Pacing Threshold Amplitude: 0.75 V
Lead Channel Pacing Threshold Pulse Width: 0.4 ms
Lead Channel Pacing Threshold Pulse Width: 0.4 ms
Lead Channel Sensing Intrinsic Amplitude: 2.5 mV
Lead Channel Sensing Intrinsic Amplitude: 2.5 mV
Lead Channel Sensing Intrinsic Amplitude: 4.5 mV
Lead Channel Sensing Intrinsic Amplitude: 4.5 mV
Lead Channel Setting Pacing Amplitude: 1.5 V
Lead Channel Setting Pacing Amplitude: 2.5 V
Lead Channel Setting Pacing Pulse Width: 0.4 ms
Lead Channel Setting Sensing Sensitivity: 1.2 mV

## 2019-07-21 ENCOUNTER — Telehealth: Payer: Self-pay | Admitting: *Deleted

## 2019-07-21 NOTE — Telephone Encounter (Signed)
Copied from [place] [id number]. Topic: General - Other >> Jul 21, 2019  3:09 PM [name] wrote: Reason for CRM: pt called in for assistance. Pt says that his handicap sticker is up for renewal at the end of this month.

## 2019-07-23 NOTE — Telephone Encounter (Signed)
Pt calling about his placards and is requesting to have a response. Please advise.

## 2019-07-27 NOTE — Telephone Encounter (Signed)
Please advise 

## 2019-07-27 NOTE — Telephone Encounter (Signed)
The form is ready to be picked up  

## 2019-07-27 NOTE — Telephone Encounter (Signed)
Patient is aware. Form has been placed up front for pick up. 

## 2019-07-31 ENCOUNTER — Telehealth: Payer: Self-pay | Admitting: *Deleted

## 2019-07-31 ENCOUNTER — Telehealth (INDEPENDENT_AMBULATORY_CARE_PROVIDER_SITE_OTHER): Payer: Medicare HMO | Admitting: Cardiovascular Disease

## 2019-07-31 ENCOUNTER — Other Ambulatory Visit: Payer: Self-pay

## 2019-07-31 VITALS — Wt 201.6 lb

## 2019-07-31 DIAGNOSIS — I10 Essential (primary) hypertension: Secondary | ICD-10-CM

## 2019-07-31 DIAGNOSIS — I2510 Atherosclerotic heart disease of native coronary artery without angina pectoris: Secondary | ICD-10-CM

## 2019-07-31 DIAGNOSIS — I5032 Chronic diastolic (congestive) heart failure: Secondary | ICD-10-CM

## 2019-07-31 DIAGNOSIS — E785 Hyperlipidemia, unspecified: Secondary | ICD-10-CM

## 2019-07-31 DIAGNOSIS — I4892 Unspecified atrial flutter: Secondary | ICD-10-CM

## 2019-07-31 MED ORDER — NITROGLYCERIN 0.4 MG SL SUBL: 0.4000 mg | SUBLINGUAL_TABLET | SUBLINGUAL | 3 refills | Status: DC | PRN

## 2019-07-31 MED ORDER — EZETIMIBE 10 MG PO TABS: 10.0000 mg | ORAL_TABLET | Freq: Every day | ORAL | 3 refills | Status: DC

## 2019-07-31 MED ORDER — APIXABAN 5 MG PO TABS: 5.0000 mg | ORAL_TABLET | Freq: Two times a day (BID) | ORAL | 5 refills | Status: DC

## 2019-07-31 NOTE — Patient Instructions (Signed)
Medication Instructions:  Your physician has recommended you make the following change in your medication:  1.) restart Eliquis (apixaban) 5 mg  - twice a day 2.) restart aspirin 81 mg daily  *If you need a refill on your cardiac medications before your next appointment, please call your pharmacy*  Lab Work: none If you have labs (blood work) drawn today and your tests are completely normal, you will receive your results only by: [name] MyChart Message (if you have MyChart) OR . A paper copy in the mail If you have any lab test that is abnormal or we need to change your treatment, we will call you to review the results. none Testing/Procedures: none  Follow-Up: At [facility], you and your health needs are our priority.  As part of our continuing mission to provide you with exceptional heart care, we have created designated Provider Care Teams.  These Care Teams include your primary Cardiologist (physician) and Advanced Practice Providers (APPs -  Physician Assistants and Nurse Practitioners) who all work together to provide you with the care you need, when you need it.  Your next appointment:   6 month(s)  The format for your next appointment:   Either In Person or Virtual  Provider:   You may see [name], MD or one of the following Advanced Practice Providers on your designated Care Team:    Melina Copa, PA-C  Ermalinda Barrios, PA-C   Other Instructions

## 2019-07-31 NOTE — Progress Notes (Signed)
Virtual Visit via Video Note   This visit type was conducted due to national recommendations for restrictions regarding the COVID-19 Pandemic (e.g. social distancing) in an effort to limit this patient's exposure and mitigate transmission in our community.  Due to his co-morbid illnesses, this patient is at least at moderate risk for complications without adequate follow up.  This format is felt to be most appropriate for this patient at this time.  All issues noted in this document were discussed and addressed.  A limited physical exam was performed with this format.  Please refer to the patient's chart for his consent to telehealth for [facility]   Date:  07/31/2019   ID:  LUMIR PAGEL, DOB 08-13-1940, MRN [id number]  Patient Location: Home Provider Location: Home  PCP:  [name], MD  Cardiologist:  [name], MD  Electrophysiologist:  None   Evaluation Performed:  Follow-Up Visit  Chief Complaint:  Follow up- CAD  History of Present Illness:    DAVI UMANSKY is a 79 y.o. male with history of CAD, HTN, HLD, sleep apnea, sinus node dysfunction s/p pacemaker, atrial flutter who is being seen today by virtual e-visit due to the [place] pandemic. Cardiac caths in 2009 and 2014 with moderate non-obstructive CAD. [name] He was admitted to [facility] April 2016 with unstable angina. Cardiac cath with severe disease in the LAD and RCA, both treated with drug eluting stents. Repeat cath May 2016 due to dyspnea with stable disease. Symptoms resolved off of Brilinta. Cardiac monitor June 2016 with PACs, PVCs. He has had severe anemia due to angiodysplasia. Cardiac monitor May 2017 with nocturnal bradycardia with rates as low as 22 bpm with blocked PACs. Echo June 2017 with normal LV systolic function, grade 1 diastolic dysfunction, moderate LVH. He was seen in the EP clinic by Dr. Lennie Odor 12/30/15 and sleep study was arranged but he did not keep this appt. He did not tolerate  Norvasc due to LE edema and did not tolerate beta blockers due to bradycardia. I saw him 12/03/16 and he c/o episodes of dizziness but no palpitations. I arranged a cardiac monitor which showed atrial fib/flutter and long pauses. Xarelto was started. Plavix was stopped. He was seen by EP and a pacemaker was implanted on 12/24/16. Synthroid increased due to high TSH. Following his pacemaker implantation, he has c/o dry cough and he was treated with a round of antibiotics by primary care. He also had c/o chest pain and dizziness. He was seen in our office 01/08/17 by Melina Copa, PA-C and based on is complaints, he was admitted to [facility] from our office. Echo 01/08/17 with normal LV size and function and no valve issues. Nuclear stress test showed a large apical/anteroapical and inferoapical scar but no ischemia. D-dimer negative. BNP was normal. Troponin negative. I saw him in my office 01/14/17 and he felt terrible with c/o weakness, fatigue, aching in legs, rare chest pains. TSH was elevated in June and Synthroid was increased. He was seen in primary care 01/16/17 and no new recommendations given. Carotid dopplers June 2017 with mild bllateral disease. Nuclear stress test July 2020 with no ischemia. Echo July 2020 with LVEF=60-65%, no valve disease.   The patient denies dyspnea, palpitations, dizziness, near syncope or syncope. No lower extremity edema. Rare chest pains with heavy exertion. His wife has stage 4 cancer. He has been under much stress. Not taking ASA or Eliquis. He forgets his ASA. He could not afford Eliquis  as [employer] insurance was not covering it. He now has news insurance and the cost is better. He wishes to restart. Some dyspnea with exertion. Has not been taking his Lasix.   The patient does not have symptoms concerning for COVID-19 infection (fever, chills, cough, or new shortness of breath).    Past Medical History:  Diagnosis Date  . Adenomatous polyp of colon 2007  . Arthritis   . Atrial  flutter ([place])   . AVM (arteriovenous malformation) 2011   a. S/p argon plasma coagulation and ablation in 2011, 2017.  . Bradycardia    a. H/o almost 7sec pause nocturnally during 2011 admission. Also has h/o fatigue with BB.  [name] CAD (coronary artery disease)    a. Nonobst disease by cath 2009. b. s/p PCI 10/2014 with DES to [place], patent by relook 11/2014 (Brilinta changed to Plavix with improved sx).  . Chronic diastolic CHF (congestive heart failure) ([place])   . Depression   . Diverticulosis   . Gastritis 2011  . GERD (gastroesophageal reflux disease)   . History of hiatal hernia   . Hyperlipidemia   . Hypertension   . Hypothyroidism   . Myocardial infarction ([place])   . Obstructive sleep apnea    -no cpap use  . Premature atrial contractions Holter 2016  . PVC's (premature ventricular contractions) Holter 2016  . Statin intolerance   . Transfusion history    several years ago -GI bleed   Past Surgical History:  Procedure Laterality Date  . ANKLE SURGERY Left   . APPENDECTOMY    . CARDIAC CATHETERIZATION N/A 11/26/2014   Procedure: Left Heart Cath and Coronary Angiography;  Surgeon: Burnell Blanks, MD;  Location: [place] [facility];  Service: Cardiovascular;  Laterality: N/A;  . CHOLECYSTECTOMY N/A 08/23/2016   Procedure: LAPAROSCOPIC CHOLECYSTECTOMY;  Surgeon: Coralie Keens, MD;  Location: [place];  Service: General;  Laterality: N/A;  . COLONOSCOPY  08-23-05   per Dr. Deatra Ina, adenomatous polyps, repeat in 5 yrs   . COLONOSCOPY WITH PROPOFOL N/A 12/06/2015   Procedure: COLONOSCOPY WITH PROPOFOL;  Surgeon: Doran Stabler, MD;  [facility]: [facility];  Service: Gastroenterology;  Laterality: N/A;  . ENTEROSCOPY N/A 12/06/2015   Procedure: ENTEROSCOPY;  Surgeon: Doran Stabler, MD;  [facility];  Service: Gastroenterology;  Laterality: N/A;  . ESOPHAGOGASTRODUODENOSCOPY  08-23-05   per Dr. Deatra Ina, cauterized jejunal AVMs   . HERNIA REPAIR    . HOT HEMOSTASIS  N/A 12/06/2015   Procedure: HOT HEMOSTASIS (ARGON PLASMA COAGULATION/BICAP);  Surgeon: Doran Stabler, MD;  Location: [facility];  Service: Gastroenterology;  Laterality: N/A;  . LEFT HEART CATHETERIZATION WITH CORONARY ANGIOGRAM N/A 09/08/2012   Procedure: LEFT HEART CATHETERIZATION WITH CORONARY ANGIOGRAM;  Surgeon: Burnell Blanks, MD;  Location: [facility] CATH LAB;  Service: Cardiovascular;  Laterality: N/A;  . LEFT HEART CATHETERIZATION WITH CORONARY ANGIOGRAM N/A 11/16/2014   Procedure: LEFT HEART CATHETERIZATION WITH CORONARY ANGIOGRAM;  Surgeon: Leonie Man, MD;  Location: [facility];  Service: Cardiovascular;  Laterality: N/A;  . PACEMAKER IMPLANT N/A 12/24/2016   Procedure: Pacemaker Implant;  Surgeon: Deboraha Sprang, MD;  Location: [place] CV LAB;  Service: Cardiovascular;  Laterality: N/A;  . SKIN GRAFT Right    leg  . TONSILLECTOMY       Current Meds  Medication Sig  . Ascorbic Acid (VITAMIN C PO) Take 4 tablets by mouth daily as needed (flu like symptopms).   . clonazePAM (KLONOPIN)  1 MG tablet Take 1 tablet (1 mg total) by mouth 3 (three) times daily as needed for anxiety.  . diclofenac sodium (VOLTAREN) 1 % GEL Apply 1 application topically daily as needed (ankle pain).  [name] diltiazem (CARDIZEM CD) 120 MG 24 hr capsule Take 1 capsule (120 mg total) by mouth daily.  [name] ezetimibe (ZETIA) 10 MG tablet Take 1 tablet (10 mg total) by mouth daily.  . fluticasone (FLONASE) 50 MCG/ACT nasal spray Place 1 spray into both nostrils daily as needed for allergies or rhinitis.  . furosemide (LASIX) 20 MG tablet Take 1 tablet (20 mg total) by mouth every other day. (Patient taking differently: Take 20 mg by mouth every other day. )  . gabapentin (NEURONTIN) 100 MG capsule TAKE 1 CAPSULE BY MOUTH THREE TIMES A DAY  . levothyroxine (SYNTHROID) 175 MCG tablet TAKE 1 TABLET (175 MCG TOTAL) BY MOUTH DAILY BEFORE BREAKFAST.  [name] nitroGLYCERIN (NITROSTAT) 0.4 MG SL tablet Place 1 tablet (0.4  mg total) under the tongue every 5 (five) minutes as needed for chest pain.  [name] omeprazole (PRILOSEC) 20 MG capsule TAKE 1 CAPSULE BY MOUTH TWICE A DAY  . oxycodone (ROXICODONE) 30 MG immediate release tablet Take 1 tablet (30 mg total) by mouth every 6 (six) hours as needed for pain.  . ropinirole (REQUIP) 5 MG tablet Take 1 tablet (5 mg total) by mouth 2 (two) times a day.  . sertraline (ZOLOFT) 50 MG tablet TAKE 1 TABLET BY MOUTH EVERY DAY  . [DISCONTINUED] ezetimibe (ZETIA) 10 MG tablet Take 1 tablet (10 mg total) by mouth daily.  . [DISCONTINUED] nitroGLYCERIN (NITROSTAT) 0.4 MG SL tablet Place 1 tablet (0.4 mg total) under the tongue every 5 (five) minutes as needed for chest pain.     Allergies:   Brilinta [ticagrelor], Metoprolol tartrate, Mirapex [pramipexole dihydrochloride], Shellfish allergy, Statins, Zolpidem, Coconut oil, Levaquin [levofloxacin], and Trazodone and nefazodone   Social History   Tobacco Use  . Smoking status: Former Smoker    Packs/day: 2.00    Years: 50.00    Pack years: 100.00    Types: Cigarettes    Quit date: 07/23/2002    Years since quitting: 17.0  . Smokeless tobacco: Never Used  Substance Use Topics  . Alcohol use: No    Alcohol/week: 0.0 standard drinks  . Drug use: No     Family Hx: The patient's family history includes Alcoholism in his maternal grandfather and paternal uncle; Heart attack in his father; Heart disease in his father; Leukemia in his mother; Stroke in his father. There is no history of Colon cancer or Esophageal cancer.  ROS:   Please see the history of present illness.    All other systems reviewed and are negative.   Prior CV studies:   The following studies were reviewed today:    Labs/Other Tests and Data Reviewed:    EKG:  No ECG reviewed.  Recent Labs: 01/10/2019: BUN 16; Creatinine, Ser 1.35; Hemoglobin 14.7; Platelets 325; Potassium 3.7; Sodium 140   Recent Lipid Panel Lab Results  Component Value Date/Time     CHOL 148 12/11/2017 08:37 AM   TRIG 113 12/11/2017 08:37 AM   HDL 38 (L) 12/11/2017 08:37 AM   CHOLHDL 3.9 12/11/2017 08:37 AM   CHOLHDL 4.6 03/22/2016 09:35 AM   LDLCALC 87 12/11/2017 08:37 AM    Wt Readings from Last 3 Encounters:  07/31/19 201 lb 9.6 oz (91.4 kg)  02/10/19 206 lb 9.6 oz (93.7 kg)  01/20/19 195  lb (88.5 kg)     Objective:    Vital Signs:  Wt 201 lb 9.6 oz (91.4 kg)   BMI 28.93 kg/m    Pt unable to provide vital signs. Phone visit so no exam   ASSESSMENT & PLAN:    1. CAD with stable angina: No recent chest pain worrisome for unstable angina. Nuclear stress test without ischemia July 2020. Continue ASA and Zetia.     2. Chronic diastolic CHF: Weight is stable at home per pt. Continue Lasix as needed      3. Hyperlipidemia: Intolerant of statins. He is on Zetia. He does not wish to consider advanced lipid therapies.  Continue Zetia  4. Sinus node dysfunction: s/p pacemaker.    5. Atrial flutter, paroxysmal: He is followed by Dr. [name]. Continue Cardizem. Restart Eliquis. Can consider atrial flutter ablation with recurrence.   COVID-19 Education: The signs and symptoms of COVID-19 were discussed with the patient and how to seek care for testing (follow up with PCP or arrange E-visit).  The importance of social distancing was discussed today.  Time:   Today, I have spent 20 minutes with the patient with telehealth technology discussing the above problems.     Medication Adjustments/Labs and Tests Ordered: Current medicines are reviewed at length with the patient today.  Concerns regarding medicines are outlined above.   Tests Ordered: No orders of the defined types were placed in this encounter.   Medication Changes: Meds ordered this encounter  Medications  . nitroGLYCERIN (NITROSTAT) 0.4 MG SL tablet    Sig: Place 1 tablet (0.4 mg total) under the tongue every 5 (five) minutes as needed for chest pain.    Dispense:  25 tablet    Refill:  3   . ezetimibe (ZETIA) 10 MG tablet    Sig: Take 1 tablet (10 mg total) by mouth daily.    Dispense:  90 tablet    Refill:  3  . apixaban (ELIQUIS) 5 MG TABS tablet    Sig: Take 1 tablet (5 mg total) by mouth 2 (two) times daily.    Dispense:  60 tablet    Refill:  5    Disposition:  Follow up in 6 month(s)  Signed, [name], MD  07/31/2019 9:51 AM    [facility]

## 2019-07-31 NOTE — Telephone Encounter (Signed)

## 2019-08-08 NOTE — Progress Notes (Signed)
PPM remote 

## 2019-08-11 DIAGNOSIS — G8929 Other chronic pain: Secondary | ICD-10-CM | POA: Diagnosis not present

## 2019-08-11 DIAGNOSIS — E039 Hypothyroidism, unspecified: Secondary | ICD-10-CM | POA: Diagnosis not present

## 2019-08-11 DIAGNOSIS — R69 Illness, unspecified: Secondary | ICD-10-CM | POA: Diagnosis not present

## 2019-08-11 DIAGNOSIS — I25119 Atherosclerotic heart disease of native coronary artery with unspecified angina pectoris: Secondary | ICD-10-CM | POA: Diagnosis not present

## 2019-08-11 DIAGNOSIS — G2581 Restless legs syndrome: Secondary | ICD-10-CM | POA: Diagnosis not present

## 2019-08-11 DIAGNOSIS — I739 Peripheral vascular disease, unspecified: Secondary | ICD-10-CM | POA: Diagnosis not present

## 2019-08-11 DIAGNOSIS — I252 Old myocardial infarction: Secondary | ICD-10-CM | POA: Diagnosis not present

## 2019-08-11 DIAGNOSIS — E663 Overweight: Secondary | ICD-10-CM | POA: Diagnosis not present

## 2019-08-11 DIAGNOSIS — E785 Hyperlipidemia, unspecified: Secondary | ICD-10-CM | POA: Diagnosis not present

## 2019-08-24 ENCOUNTER — Telehealth (INDEPENDENT_AMBULATORY_CARE_PROVIDER_SITE_OTHER): Payer: Medicare HMO | Admitting: Family Medicine

## 2019-08-24 ENCOUNTER — Other Ambulatory Visit: Payer: Self-pay

## 2019-08-24 DIAGNOSIS — F1190 Opioid use, unspecified, uncomplicated: Secondary | ICD-10-CM

## 2019-08-24 DIAGNOSIS — R69 Illness, unspecified: Secondary | ICD-10-CM | POA: Diagnosis not present

## 2019-08-24 DIAGNOSIS — M5440 Lumbago with sciatica, unspecified side: Secondary | ICD-10-CM

## 2019-08-24 MED ORDER — OXYCODONE HCL 30 MG PO TABS: 30.0000 mg | ORAL_TABLET | Freq: Four times a day (QID) | ORAL | 0 refills | Status: DC | PRN

## 2019-08-24 NOTE — Progress Notes (Signed)
Virtual Visit via Video Note  I connected with the patient on 08/24/19 at 10:15 AM EST by a video enabled telemedicine application and verified that I am speaking with the correct person using two identifiers.  Location patient: home Location provider:work or home office Persons participating in the virtual visit: patient, provider  I discussed the limitations of evaluation and management by telemedicine and the availability of in person appointments. The patient expressed understanding and agreed to proceed.   HPI: Here for pain management. His pain levels are stable. He is struggling now emotionally because his wife is dying of lung cancer.  Indication for chronic opioid: low back pain Medication and dose: Oxycodone 30 mg # pills per month: 120 Last UDS date: 08-26-18 Opioid Treatment Agreement signed (Y/N): 08-26-17 Opioid Treatment Agreement last reviewed with patient:  08-24-19 NCCSRS reviewed this encounter (include red flags): Yes    ROS: See pertinent positives and negatives per HPI.  Past Medical History:  Diagnosis Date  . Adenomatous polyp of colon 2007  . Arthritis   . Atrial flutter ([place])   . AVM (arteriovenous malformation) 2011   a. S/p argon plasma coagulation and ablation in 2011, 2017.  . Bradycardia    a. H/o almost 7sec pause nocturnally during 2011 admission. Also has h/o fatigue with BB.  [name] CAD (coronary artery disease)    a. Nonobst disease by cath 2009. b. s/p PCI 10/2014 with DES to [place], patent by relook 11/2014 (Brilinta changed to Plavix with improved sx).  . Chronic diastolic CHF (congestive heart failure) ([place])   . Depression   . Diverticulosis   . Gastritis 2011  . GERD (gastroesophageal reflux disease)   . History of hiatal hernia   . Hyperlipidemia   . Hypertension   . Hypothyroidism   . Myocardial infarction ([place])   . Obstructive sleep apnea    -no cpap use  . Premature atrial contractions Holter 2016  . PVC's (premature ventricular  contractions) Holter 2016  . Statin intolerance   . Transfusion history    several years ago -GI bleed    Past Surgical History:  Procedure Laterality Date  . ANKLE SURGERY Left   . APPENDECTOMY    . CARDIAC CATHETERIZATION N/A 11/26/2014   Procedure: Left Heart Cath and Coronary Angiography;  Surgeon: Burnell Blanks, MD;  Location: [place] CV LAB;  Service: Cardiovascular;  Laterality: N/A;  . CHOLECYSTECTOMY N/A 08/23/2016   Procedure: LAPAROSCOPIC CHOLECYSTECTOMY;  Surgeon: Coralie Keens, MD;  Location: [place];  Service: General;  Laterality: N/A;  . COLONOSCOPY  08-23-05   per Dr. Deatra Ina, adenomatous polyps, repeat in 5 yrs   . COLONOSCOPY WITH PROPOFOL N/A 12/06/2015   Procedure: COLONOSCOPY WITH PROPOFOL;  Surgeon: Doran Stabler, MD;  [facility];  Service: Gastroenterology;  Laterality: N/A;  . ENTEROSCOPY N/A 12/06/2015   Procedure: ENTEROSCOPY;  Surgeon: Doran Stabler, MD;  [facility];  Service: Gastroenterology;  Laterality: N/A;  . ESOPHAGOGASTRODUODENOSCOPY  08-23-05   per Dr. Deatra Ina, cauterized jejunal AVMs   . HERNIA REPAIR    . HOT HEMOSTASIS N/A 12/06/2015   Procedure: HOT HEMOSTASIS (ARGON PLASMA COAGULATION/BICAP);  Surgeon: Doran Stabler, MD;  Location: [facility];  Service: Gastroenterology;  Laterality: N/A;  . LEFT HEART CATHETERIZATION WITH CORONARY ANGIOGRAM N/A 09/08/2012   Procedure: LEFT HEART CATHETERIZATION WITH CORONARY ANGIOGRAM;  Surgeon: Burnell Blanks, MD;  Location: [facility] CATH LAB;  Service: Cardiovascular;  Laterality: N/A;  . LEFT  HEART CATHETERIZATION WITH CORONARY ANGIOGRAM N/A 11/16/2014   Procedure: LEFT HEART CATHETERIZATION WITH CORONARY ANGIOGRAM;  Surgeon: Leonie Man, MD;  Location: [facility] CATH LAB;  Service: Cardiovascular;  Laterality: N/A;  . PACEMAKER IMPLANT N/A 12/24/2016   Procedure: Pacemaker Implant;  Surgeon: Deboraha Sprang, MD;  Location: [place] CV LAB;  Service: Cardiovascular;   Laterality: N/A;  . SKIN GRAFT Right    leg  . TONSILLECTOMY      Family History  Problem Relation Age of Onset  . Leukemia Mother   . Heart disease Father   . Stroke Father   . Heart attack Father   . Alcoholism Paternal Uncle   . Alcoholism Maternal Grandfather   . Colon cancer Neg Hx   . Esophageal cancer Neg Hx      Current Outpatient Medications:  .  apixaban (ELIQUIS) 5 MG TABS tablet, Take 1 tablet (5 mg total) by mouth 2 (two) times daily., Disp: 60 tablet, Rfl: 5 .  Ascorbic Acid (VITAMIN C PO), Take 4 tablets by mouth daily as needed (flu like symptopms). , Disp: , Rfl:  .  aspirin EC 81 MG tablet, Take 81 mg by mouth daily., Disp: , Rfl:  .  clonazePAM (KLONOPIN) 1 MG tablet, Take 1 tablet (1 mg total) by mouth 3 (three) times daily as needed for anxiety., Disp: 90 tablet, Rfl: 5 .  diclofenac sodium (VOLTAREN) 1 % GEL, Apply 1 application topically daily as needed (ankle pain)., Disp: 5 Tube, Rfl: 10 .  diltiazem (CARDIZEM CD) 120 MG 24 hr capsule, Take 1 capsule (120 mg total) by mouth daily., Disp: 90 capsule, Rfl: 3 .  ezetimibe (ZETIA) 10 MG tablet, Take 1 tablet (10 mg total) by mouth daily., Disp: 90 tablet, Rfl: 3 .  fluticasone (FLONASE) 50 MCG/ACT nasal spray, Place 1 spray into both nostrils daily as needed for allergies or rhinitis., Disp: 48 g, Rfl: 3 .  furosemide (LASIX) 20 MG tablet, Take 1 tablet (20 mg total) by mouth every other day. (Patient taking differently: Take 20 mg by mouth every other day. ), Disp: 15 tablet, Rfl: 10 .  gabapentin (NEURONTIN) 100 MG capsule, TAKE 1 CAPSULE BY MOUTH THREE TIMES A DAY, Disp: 270 capsule, Rfl: 1 .  levothyroxine (SYNTHROID) 175 MCG tablet, TAKE 1 TABLET (175 MCG TOTAL) BY MOUTH DAILY BEFORE BREAKFAST., Disp: 90 tablet, Rfl: 2 .  nitroGLYCERIN (NITROSTAT) 0.4 MG SL tablet, Place 1 tablet (0.4 mg total) under the tongue every 5 (five) minutes as needed for chest pain., Disp: 25 tablet, Rfl: 3 .  omeprazole (PRILOSEC)  20 MG capsule, TAKE 1 CAPSULE BY MOUTH TWICE A DAY, Disp: 180 capsule, Rfl: 3 .  [START ON 10/25/2019] oxycodone (ROXICODONE) 30 MG immediate release tablet, Take 1 tablet (30 mg total) by mouth every 6 (six) hours as needed for pain., Disp: 120 tablet, Rfl: 0 .  ropinirole (REQUIP) 5 MG tablet, Take 1 tablet (5 mg total) by mouth 2 (two) times a day., Disp: 180 tablet, Rfl: 3 .  sertraline (ZOLOFT) 50 MG tablet, TAKE 1 TABLET BY MOUTH EVERY DAY, Disp: 90 tablet, Rfl: 3  EXAM:  VITALS per patient if applicable:  GENERAL: alert, oriented, appears well and in no acute distress  HEENT: atraumatic, conjunttiva clear, no obvious abnormalities on inspection of external nose and ears  NECK: normal movements of the head and neck  LUNGS: on inspection no signs of respiratory distress, breathing rate appears normal, no obvious gross SOB, gasping or  wheezing  CV: no obvious cyanosis  MS: moves all visible extremities without noticeable abnormality  PSYCH/NEURO: pleasant and cooperative, no obvious depression or anxiety, speech and thought processing grossly intact  ASSESSMENT AND PLAN: Pain management, meds were refilled.  [name], MD  Discussed the following assessment and plan:  No diagnosis found.     I discussed the assessment and treatment plan with the patient. The patient was provided an opportunity to ask questions and all were answered. The patient agreed with the plan and demonstrated an understanding of the instructions.   The patient was advised to call back or seek an in-person evaluation if the symptoms worsen or if the condition fails to improve as anticipated.

## 2019-09-29 ENCOUNTER — Other Ambulatory Visit: Payer: Self-pay | Admitting: Family

## 2019-09-30 ENCOUNTER — Ambulatory Visit (INDEPENDENT_AMBULATORY_CARE_PROVIDER_SITE_OTHER): Payer: Medicare HMO | Admitting: *Deleted

## 2019-09-30 DIAGNOSIS — I495 Sick sinus syndrome: Secondary | ICD-10-CM | POA: Diagnosis not present

## 2019-09-30 LAB — CUP PACEART REMOTE DEVICE CHECK
Battery Remaining Longevity: 146 mo
Battery Voltage: 3.02 V
Brady Statistic AP VP Percent: 2.7 %
Brady Statistic AP VS Percent: 10.28 %
Brady Statistic AS VP Percent: 7.58 %
Brady Statistic AS VS Percent: 79.43 %
Brady Statistic RA Percent Paced: 13.97 %
Brady Statistic RV Percent Paced: 10.29 %
Date Time Interrogation Session: 20210310000228
Implantable Lead Implant Date: 20180604
Implantable Lead Implant Date: 20180604
Implantable Lead Location: 753859
Implantable Lead Location: 753860
Implantable Lead Model: 5076
Implantable Lead Model: 5076
Implantable Pulse Generator Implant Date: 20180604
Lead Channel Impedance Value: 323 Ohm
Lead Channel Impedance Value: 418 Ohm
Lead Channel Impedance Value: 475 Ohm
Lead Channel Impedance Value: 494 Ohm
Lead Channel Pacing Threshold Amplitude: 0.625 V
Lead Channel Pacing Threshold Amplitude: 0.75 V
Lead Channel Pacing Threshold Pulse Width: 0.4 ms
Lead Channel Pacing Threshold Pulse Width: 0.4 ms
Lead Channel Sensing Intrinsic Amplitude: 3.375 mV
Lead Channel Sensing Intrinsic Amplitude: 3.375 mV
Lead Channel Sensing Intrinsic Amplitude: 4.625 mV
Lead Channel Sensing Intrinsic Amplitude: 4.625 mV
Lead Channel Setting Pacing Amplitude: 1.5 V
Lead Channel Setting Pacing Amplitude: 2.5 V
Lead Channel Setting Pacing Pulse Width: 0.4 ms
Lead Channel Setting Sensing Sensitivity: 1.2 mV

## 2019-10-01 NOTE — Progress Notes (Signed)
PPM Remote  

## 2019-10-08 ENCOUNTER — Telehealth: Payer: Self-pay | Admitting: Family Medicine

## 2019-10-08 NOTE — Telephone Encounter (Signed)
Message Routed to PCP  for approval. 

## 2019-10-08 NOTE — Telephone Encounter (Signed)
Medication Refill: Pharmacy: [facility]  Phone:   Pt is requesting something to help him sleep, he is not sleeping well after his wife passed a few weeks ago.

## 2019-10-09 MED ORDER — TEMAZEPAM 30 MG PO CAPS: 30.0000 mg | ORAL_CAPSULE | Freq: Every evening | ORAL | 2 refills | Status: DC | PRN

## 2019-10-09 NOTE — Telephone Encounter (Signed)
I sent in Temazepam for him to try

## 2019-10-12 NOTE — Telephone Encounter (Signed)
Noted  

## 2019-10-18 ENCOUNTER — Other Ambulatory Visit: Payer: Self-pay

## 2019-10-18 ENCOUNTER — Ambulatory Visit (HOSPITAL_COMMUNITY)
Admission: EM | Admit: 2019-10-18 | Discharge: 2019-10-18 | Disposition: A | Discharge status: Other Acute Inpatient Hospital | Payer: Medicare HMO

## 2019-10-20 ENCOUNTER — Telehealth: Payer: Self-pay | Admitting: Family Medicine

## 2019-10-20 NOTE — Telephone Encounter (Signed)
Patient dropped off Disability License Plate  Call patient to pick up at: [phone]  Disposition: Dr's Folder

## 2019-10-21 ENCOUNTER — Other Ambulatory Visit: Payer: Self-pay

## 2019-10-21 ENCOUNTER — Ambulatory Visit (INDEPENDENT_AMBULATORY_CARE_PROVIDER_SITE_OTHER): Payer: Medicare HMO | Admitting: Family Medicine

## 2019-10-21 ENCOUNTER — Encounter: Payer: Self-pay | Admitting: Family Medicine

## 2019-10-21 VITALS — BP 150/62 | HR 69 | Temp 98.0°F | Wt 218.0 lb

## 2019-10-21 DIAGNOSIS — R69 Illness, unspecified: Secondary | ICD-10-CM | POA: Diagnosis not present

## 2019-10-21 DIAGNOSIS — S300XXA Contusion of lower back and pelvis, initial encounter: Secondary | ICD-10-CM | POA: Diagnosis not present

## 2019-10-21 DIAGNOSIS — G2581 Restless legs syndrome: Secondary | ICD-10-CM | POA: Diagnosis not present

## 2019-10-21 DIAGNOSIS — M79604 Pain in right leg: Secondary | ICD-10-CM | POA: Diagnosis not present

## 2019-10-21 DIAGNOSIS — F4321 Adjustment disorder with depressed mood: Secondary | ICD-10-CM

## 2019-10-21 DIAGNOSIS — F4320 Adjustment disorder, unspecified: Secondary | ICD-10-CM

## 2019-10-21 DIAGNOSIS — M79605 Pain in left leg: Secondary | ICD-10-CM | POA: Diagnosis not present

## 2019-10-21 MED ORDER — GABAPENTIN 100 MG PO CAPS: ORAL_CAPSULE | ORAL | 5 refills | Status: DC

## 2019-10-21 NOTE — Telephone Encounter (Signed)
Patient picked up paperwork at office visit 10/21/2019

## 2019-10-21 NOTE — Progress Notes (Signed)
   Subjective:    Patient ID: [name], male    DOB: [birth year], 79 y.o.   MRN: [id number]  HPI Here for several issues. First [name] wants to talk about losing his wife, who was also a patient of mine. She passed away from lung cancer several weeks ago, and he has had a tough time since then. He has the support of family and friends. His restless legs have been much worse lately. He normally takes Ropinirole 5 mg BID. tthe legs keep him up at night and he gets very little sleep. He got frustrated and 2 of these together with a Temazepam several nights ago, and when he got up the next morning he fell. He thought there was a chair behind him when there was not, and he fell on the hardwood floor on his bottom. He had severe pain in the left buttock at first, but this has been slowly improving since then. He found that sitting in his jaccuzzi helps. He can walks easily on it. He takes his usual Oxycodone.    Review of Systems  Constitutional: Negative.   Respiratory: Negative.   Cardiovascular: Negative.   Musculoskeletal: Positive for myalgias.  Psychiatric/Behavioral: Positive for decreased concentration, dysphoric mood and sleep disturbance. The patient is nervous/anxious.        Objective:   Physical Exam Constitutional:      General: He is not in acute distress.    Appearance: Normal appearance.     Comments: He walks easily   Cardiovascular:     Rate and Rhythm: Normal rate and regular rhythm.     Pulses: Normal pulses.     Heart sounds: Normal heart sounds.  Pulmonary:     Effort: Pulmonary effort is normal.     Breath sounds: Normal breath sounds.  Musculoskeletal:     Comments: He is very tender in the left buttock directly over the ischium. The hip is normal with full ROM  Neurological:     General: No focal deficit present.     Mental Status: He is alert and oriented to person, place, and time.  Psychiatric:     Comments: Tearful            Assessment & Plan:    He is grieving the loss of his wife. I reminded him that the counselors at [facility] are available to help him, and I encouraged him to take advantage of this. He has a deep bruise to his buttock, but this should heal well over the next few weeks. For the restless legs, I reminded him to never take more than one Repinirole at a time. He ran out of Gabapentin a year ago, so we will start him back on Gabapentin 100 mg TID. Recheck prn. Christopher Penna, MD

## 2019-10-21 NOTE — Telephone Encounter (Signed)
Patient is aware 

## 2019-10-21 NOTE — Telephone Encounter (Signed)
The form is ready  

## 2019-11-19 ENCOUNTER — Telehealth (INDEPENDENT_AMBULATORY_CARE_PROVIDER_SITE_OTHER): Payer: Medicare HMO | Admitting: Family Medicine

## 2019-11-19 ENCOUNTER — Encounter: Payer: Self-pay | Admitting: Family Medicine

## 2019-11-19 ENCOUNTER — Other Ambulatory Visit: Payer: Self-pay

## 2019-11-19 DIAGNOSIS — F1190 Opioid use, unspecified, uncomplicated: Secondary | ICD-10-CM

## 2019-11-19 DIAGNOSIS — M5440 Lumbago with sciatica, unspecified side: Secondary | ICD-10-CM | POA: Diagnosis not present

## 2019-11-19 MED ORDER — OXYCODONE HCL 30 MG PO TABS: 30.0000 mg | ORAL_TABLET | Freq: Four times a day (QID) | ORAL | 0 refills | Status: DC | PRN

## 2019-11-19 NOTE — Progress Notes (Signed)
   Subjective:    Patient ID: DOLL STELTZ, male    DOB: 1941/02/01, 79 y.o.   MRN: [id number]  HPI Virtual Visit via Telephone Note  I connected with the patient on 11/19/19 at 11:15 AM EDT by telephone and verified that I am speaking with the correct person using two identifiers.   I discussed the limitations, risks, security and privacy concerns of performing an evaluation and management service by telephone and the availability of in person appointments. I also discussed with the patient that there may be a patient responsible charge related to this service. The patient expressed understanding and agreed to proceed.  Location patient: home Location provider: work or home office Participants present for the call: patient, provider Patient did not have a visit in the prior 7 days to address this/these issue(s).   History of Present Illness: Here for pain management, he is doing well.  Indication for chronic opioid: low back pain Medication and dose: Oxycodone 30 mg  # pills per month: 120 Last UDS date: 08-26-18 Opioid Treatment Agreement signed (Y/N): 08-26-17 Opioid Treatment Agreement last reviewed with patient:  11-19-19 [facility] reviewed this encounter (include red flags): Yes    Observations/Objective: Patient sounds cheerful and well on the phone. I do not appreciate any SOB. Speech and thought processing are grossly intact. Patient reported vitals:  Assessment and Plan: Pain management, meds were refilled.  [name], MD   Follow Up Instructions:     [phone] 5-10 [phone] 11-20 [id number] 21-30 I did not refer this patient for an OV in the next 24 hours for this/these issue(s).  I discussed the assessment and treatment plan with the patient. The patient was provided an opportunity to ask questions and all were answered. The patient agreed with the plan and demonstrated an understanding of the instructions.   The patient was advised to call back or seek an in-person  evaluation if the symptoms worsen or if the condition fails to improve as anticipated.  I provided 13 minutes of non-face-to-face time during this encounter.   [name], MD    Review of Systems     Objective:   Physical Exam        Assessment & Plan:

## 2019-11-23 ENCOUNTER — Other Ambulatory Visit: Payer: Self-pay | Admitting: Cardiology

## 2019-11-23 ENCOUNTER — Other Ambulatory Visit: Payer: Self-pay | Admitting: Family Medicine

## 2019-12-11 ENCOUNTER — Telehealth: Payer: Self-pay | Admitting: Family Medicine

## 2019-12-11 MED ORDER — COLCHICINE 0.6 MG PO TABS: ORAL_TABLET | ORAL | 5 refills | Status: DC

## 2019-12-11 NOTE — Telephone Encounter (Signed)
Patient states he is having a flare up of Gout.  He said it is the worst it has been in awhile.  He said his son has take Chochicine before and it helped so he wants to see if he can get this called in.  His pain medication he takes isn't helping the pain.

## 2019-12-11 NOTE — Telephone Encounter (Signed)
Rx sent in. Patient is aware.  

## 2019-12-11 NOTE — Telephone Encounter (Signed)
Call in Colchicine 0.6 mg to take 1-2 every 6 hours prn gout, #60 with 5 rf

## 2019-12-30 ENCOUNTER — Ambulatory Visit (INDEPENDENT_AMBULATORY_CARE_PROVIDER_SITE_OTHER): Payer: Medicare HMO | Admitting: *Deleted

## 2019-12-30 DIAGNOSIS — I495 Sick sinus syndrome: Secondary | ICD-10-CM

## 2019-12-30 LAB — CUP PACEART REMOTE DEVICE CHECK
Battery Remaining Longevity: 142 mo
Battery Voltage: 3.01 V
Brady Statistic AP VP Percent: 2.58 %
Brady Statistic AP VS Percent: 29.6 %
Brady Statistic AS VP Percent: 3.16 %
Brady Statistic AS VS Percent: 64.66 %
Brady Statistic RA Percent Paced: 32.42 %
Brady Statistic RV Percent Paced: 5.74 %
Date Time Interrogation Session: 20210609010107
Implantable Lead Implant Date: 20180604
Implantable Lead Implant Date: 20180604
Implantable Lead Location: 753859
Implantable Lead Location: 753860
Implantable Lead Model: 5076
Implantable Lead Model: 5076
Implantable Pulse Generator Implant Date: 20180604
Lead Channel Impedance Value: 323 Ohm
Lead Channel Impedance Value: 399 Ohm
Lead Channel Impedance Value: 437 Ohm
Lead Channel Impedance Value: 494 Ohm
Lead Channel Pacing Threshold Amplitude: 0.625 V
Lead Channel Pacing Threshold Amplitude: 0.875 V
Lead Channel Pacing Threshold Pulse Width: 0.4 ms
Lead Channel Pacing Threshold Pulse Width: 0.4 ms
Lead Channel Sensing Intrinsic Amplitude: 3.5 mV
Lead Channel Sensing Intrinsic Amplitude: 3.5 mV
Lead Channel Sensing Intrinsic Amplitude: 5 mV
Lead Channel Sensing Intrinsic Amplitude: 5 mV
Lead Channel Setting Pacing Amplitude: 1.5 V
Lead Channel Setting Pacing Amplitude: 2.5 V
Lead Channel Setting Pacing Pulse Width: 0.4 ms
Lead Channel Setting Sensing Sensitivity: 1.2 mV

## 2019-12-31 NOTE — Progress Notes (Signed)
Remote pacemaker transmission.   

## 2020-01-01 ENCOUNTER — Telehealth: Payer: Self-pay | Admitting: *Deleted

## 2020-01-01 NOTE — Telephone Encounter (Signed)
Left a detailed message on verified voice mail informing the patient that CBD oil or gummies would be safe for him to use with his other medications.

## 2020-01-01 NOTE — Telephone Encounter (Signed)
No these would not affect his other medications. He can try them if he wishes

## 2020-01-01 NOTE — Telephone Encounter (Signed)
Patient called after hours line. Patient wanting to discuss the CBD Oil or Gummy Bears, patients wants to know if taking this will effect his current medication

## 2020-01-10 ENCOUNTER — Other Ambulatory Visit: Payer: Self-pay | Admitting: Family Medicine

## 2020-01-11 ENCOUNTER — Telehealth: Payer: Self-pay | Admitting: *Deleted

## 2020-01-11 NOTE — Telephone Encounter (Signed)
Patient called after hours line. Patient reports he is on a medication for gout. States the pharmacy indicated there is a drug interaction with something else he is on. States he is having foot swelling. Wants to know what they should be doing.

## 2020-01-11 NOTE — Telephone Encounter (Signed)
Last filled 05/21/2019 Last OV 10/21/2019  Ok to fill?

## 2020-01-12 ENCOUNTER — Other Ambulatory Visit: Payer: Self-pay

## 2020-01-12 NOTE — Telephone Encounter (Signed)
Tell him to go ahead and take the Colchicine as needed. No need to worry about an interaction

## 2020-01-12 NOTE — Telephone Encounter (Signed)
Spoke with the patient. He is aware he can go ahead and take this medication.   Pt stated that he has "found a third testicle." he has an appointment scheduled for tomorrow but wanted me to make Dr. Sarajane Jews aware. He is afraid this could be cancer.  Will send to Dr. Sarajane Jews as Juluis Rainier

## 2020-01-13 ENCOUNTER — Ambulatory Visit: Payer: Medicare HMO | Admitting: Family Medicine

## 2020-01-13 ENCOUNTER — Encounter: Payer: Self-pay | Admitting: Family Medicine

## 2020-01-13 VITALS — BP 130/70 | HR 65 | Temp 98.4°F | Wt 223.0 lb

## 2020-01-13 DIAGNOSIS — N451 Epididymitis: Secondary | ICD-10-CM | POA: Diagnosis not present

## 2020-01-13 DIAGNOSIS — N5089 Other specified disorders of the male genital organs: Secondary | ICD-10-CM | POA: Diagnosis not present

## 2020-01-13 MED ORDER — DOXYCYCLINE HYCLATE 100 MG PO TABS: 100.0000 mg | ORAL_TABLET | Freq: Two times a day (BID) | ORAL | 0 refills | Status: DC

## 2020-01-13 NOTE — Progress Notes (Signed)
   Subjective:    Patient ID: TEJ MURDAUGH, male    DOB: 1941-03-08, 79 y.o.   MRN: [id number]  HPI Here for 2 days of mild sharp pains in the scrotum and while in the shower he discovered a large mass in the scrotum that felt like a "third testicle". No recent trauma. An [place] in 2001 revealed a small epididymal cyst. No urinary issues.    Review of Systems  Constitutional: Negative.   Respiratory: Negative.   Cardiovascular: Negative.   Genitourinary: Positive for scrotal swelling and testicular pain. Negative for difficulty urinating.       Objective:   Physical Exam Constitutional:      Appearance: Normal appearance.  Cardiovascular:     Rate and Rhythm: Normal rate and regular rhythm.     Pulses: Normal pulses.     Heart sounds: Normal heart sounds.  Pulmonary:     Effort: Pulmonary effort is normal.     Breath sounds: Normal breath sounds.  Genitourinary:    Comments: The right testicle appears normal . The left testicle is mildly tender and there is a cystic mass anterior to this that is not tender  Neurological:     Mental Status: He is alert.           Assessment & Plan:  Scrotal mass with some epididymitis. Treat with Doxycycline and refer to urology.  [name], MD

## 2020-01-14 DIAGNOSIS — M79631 Pain in right forearm: Secondary | ICD-10-CM | POA: Diagnosis not present

## 2020-01-14 DIAGNOSIS — M25551 Pain in right hip: Secondary | ICD-10-CM | POA: Diagnosis not present

## 2020-01-14 DIAGNOSIS — M79641 Pain in right hand: Secondary | ICD-10-CM | POA: Diagnosis not present

## 2020-01-14 DIAGNOSIS — G8929 Other chronic pain: Secondary | ICD-10-CM | POA: Diagnosis not present

## 2020-01-14 DIAGNOSIS — M542 Cervicalgia: Secondary | ICD-10-CM | POA: Diagnosis not present

## 2020-01-14 DIAGNOSIS — W19XXXA Unspecified fall, initial encounter: Secondary | ICD-10-CM | POA: Diagnosis not present

## 2020-01-14 DIAGNOSIS — R0902 Hypoxemia: Secondary | ICD-10-CM | POA: Diagnosis not present

## 2020-01-14 DIAGNOSIS — M25511 Pain in right shoulder: Secondary | ICD-10-CM | POA: Diagnosis not present

## 2020-01-15 ENCOUNTER — Telehealth: Payer: Self-pay | Admitting: Cardiovascular Disease

## 2020-01-15 ENCOUNTER — Telehealth: Payer: Self-pay

## 2020-01-15 NOTE — Telephone Encounter (Signed)
See documentation in initial phone note.

## 2020-01-15 NOTE — Telephone Encounter (Signed)
LMOM for patient to call office. Device Clinic # and office hours provided. Need remote transmission and to assess sx.

## 2020-01-15 NOTE — Telephone Encounter (Signed)
Transferred call to device clinic.

## 2020-01-15 NOTE — Telephone Encounter (Signed)
Incoming pt call, pt states that something is wrong with his pacemaker. He reports that he fell yesterday at 4 and went to the ED. He was told there that his pacemaker needed to be checked right away and that he needed to be seen today. Pt states he turned his monitor on and was unsure what it was doing, but that it has never been on that long. Pt is a pt of Dr.McAlhany and Dr. [name].  Notified pt that per note in chart the device clinic had been attempting to call him this morning. Other triage nurse on the phone with the [address] office and notified them we would transfer his call to their device clinic. Notified pt we are transferring his call to them. Pt verbalized understanding and was thankful for our help.  Call transferred to device clinic.

## 2020-01-15 NOTE — Telephone Encounter (Signed)
Patient states that he was told his pace maker is acting up. He is very scared and wants an appt with Dr. [name] or an APP. I told him that the next available appt was not until mid July. Patient has seen [name] in the past, last OV was in September 2018. I pulled up his schedule as well as an APP's but still did not see anything until mid July. Patient states that he lost his wife in February and that he is not ready to die. Patient was not willing to schedule in July, he feels like he needs to be seen ASAP.

## 2020-01-15 NOTE — Telephone Encounter (Signed)
Patient informed that his remote transmission was received. Patient reports that he fell and was seen at an urgent care center that informed him to have his pacemaker checked after the fall. Patient fell on right side and had CP from right to left extending over device at the time he was seen after the fall. Remote transmission shows device function WNL , no alerts for any atrial or ventricular arrhytmias. Patient has no complaints of pain at device site , no SOB, and no syncope reported. Reassured that device functioning normally. ED precautions given for CP , SOB, Syncope or worsening cardiac symptoms. Patient requested note from Dr [name] so he does not have to wear seat belt due to device, Informed that practice does not provide notes that give permission to not wear sear belts. Patient asked about using rifle to shoot left handed and education done that rifle should not be shot left handed due to possability of recoil causing issues with pacemaker .Will forward to scheduling due to patient being overdue for follow up with Dr [name] and cardiology. ED

## 2020-01-15 NOTE — Telephone Encounter (Signed)
LMOM to call office. Transmission received and reviewed.Presenting EGM AS/VS  With occasion PAC and PVC. Device function WNL, no atrial or ventricular events recorded. VP 27.3% and AP 25.1%.

## 2020-01-18 ENCOUNTER — Emergency Department (HOSPITAL_BASED_OUTPATIENT_CLINIC_OR_DEPARTMENT_OTHER): Payer: Medicare HMO

## 2020-01-18 ENCOUNTER — Telehealth: Payer: Self-pay

## 2020-01-18 ENCOUNTER — Encounter (HOSPITAL_BASED_OUTPATIENT_CLINIC_OR_DEPARTMENT_OTHER): Payer: Self-pay | Admitting: *Deleted

## 2020-01-18 ENCOUNTER — Emergency Department (HOSPITAL_BASED_OUTPATIENT_CLINIC_OR_DEPARTMENT_OTHER)
Admission: EM | Admit: 2020-01-18 | Discharge: 2020-01-18 | Disposition: A | Discharge status: Home / Self Care | Payer: Medicare HMO | Attending: Emergency Medicine | Admitting: Emergency Medicine

## 2020-01-18 ENCOUNTER — Ambulatory Visit (INDEPENDENT_AMBULATORY_CARE_PROVIDER_SITE_OTHER): Payer: Medicare HMO | Admitting: Internal Medicine

## 2020-01-18 ENCOUNTER — Encounter: Payer: Self-pay | Admitting: Internal Medicine

## 2020-01-18 ENCOUNTER — Other Ambulatory Visit: Payer: Self-pay

## 2020-01-18 VITALS — BP 144/82 | HR 96 | Temp 97.8°F | Ht 69.0 in | Wt 218.0 lb

## 2020-01-18 DIAGNOSIS — E039 Hypothyroidism, unspecified: Secondary | ICD-10-CM | POA: Insufficient documentation

## 2020-01-18 DIAGNOSIS — I5032 Chronic diastolic (congestive) heart failure: Secondary | ICD-10-CM | POA: Diagnosis not present

## 2020-01-18 DIAGNOSIS — S0990XA Unspecified injury of head, initial encounter: Secondary | ICD-10-CM

## 2020-01-18 DIAGNOSIS — J9811 Atelectasis: Secondary | ICD-10-CM | POA: Diagnosis not present

## 2020-01-18 DIAGNOSIS — I2510 Atherosclerotic heart disease of native coronary artery without angina pectoris: Secondary | ICD-10-CM | POA: Diagnosis not present

## 2020-01-18 DIAGNOSIS — I10 Essential (primary) hypertension: Secondary | ICD-10-CM | POA: Diagnosis not present

## 2020-01-18 DIAGNOSIS — R55 Syncope and collapse: Secondary | ICD-10-CM

## 2020-01-18 DIAGNOSIS — M79605 Pain in left leg: Secondary | ICD-10-CM | POA: Diagnosis not present

## 2020-01-18 DIAGNOSIS — R1310 Dysphagia, unspecified: Secondary | ICD-10-CM | POA: Diagnosis not present

## 2020-01-18 DIAGNOSIS — I110 Hypertensive heart disease with heart failure: Secondary | ICD-10-CM | POA: Insufficient documentation

## 2020-01-18 DIAGNOSIS — R519 Headache, unspecified: Secondary | ICD-10-CM | POA: Diagnosis not present

## 2020-01-18 DIAGNOSIS — M79604 Pain in right leg: Secondary | ICD-10-CM | POA: Insufficient documentation

## 2020-01-18 DIAGNOSIS — Z950 Presence of cardiac pacemaker: Secondary | ICD-10-CM

## 2020-01-18 DIAGNOSIS — Z7901 Long term (current) use of anticoagulants: Secondary | ICD-10-CM | POA: Insufficient documentation

## 2020-01-18 DIAGNOSIS — Z79899 Other long term (current) drug therapy: Secondary | ICD-10-CM | POA: Insufficient documentation

## 2020-01-18 DIAGNOSIS — R079 Chest pain, unspecified: Secondary | ICD-10-CM | POA: Diagnosis not present

## 2020-01-18 DIAGNOSIS — G2581 Restless legs syndrome: Secondary | ICD-10-CM | POA: Diagnosis not present

## 2020-01-18 LAB — CBC
HCT: 45.3 % (ref 39.0–52.0)
Hemoglobin: 14.1 g/dL (ref 13.0–17.0)
MCH: 29.1 pg (ref 26.0–34.0)
MCHC: 31.1 g/dL (ref 30.0–36.0)
MCV: 93.6 fL (ref 80.0–100.0)
Platelets: 283 10*3/uL (ref 150–400)
RBC: 4.84 MIL/uL (ref 4.22–5.81)
RDW: 15 % (ref 11.5–15.5)
WBC: 12.6 10*3/uL — ABNORMAL HIGH (ref 4.0–10.5)
nRBC: 0 % (ref 0.0–0.2)

## 2020-01-18 LAB — BASIC METABOLIC PANEL
Anion gap: 10 (ref 5–15)
BUN: 19 mg/dL (ref 8–23)
CO2: 25 mmol/L (ref 22–32)
Calcium: 9 mg/dL (ref 8.9–10.3)
Chloride: 102 mmol/L (ref 98–111)
Creatinine, Ser: 1.16 mg/dL (ref 0.61–1.24)
GFR calc Af Amer: >60 mL/min (ref >60)
GFR calc non Af Amer: 60 mL/min — ABNORMAL LOW (ref >60)
Glucose, Bld: 157 mg/dL — ABNORMAL HIGH (ref 70–99)
Potassium: 3.9 mmol/L (ref 3.5–5.1)
Sodium: 137 mmol/L (ref 135–145)

## 2020-01-18 LAB — TROPONIN I (HIGH SENSITIVITY): Troponin I (High Sensitivity): 6 ng/L (ref <18)

## 2020-01-18 MED ORDER — MORPHINE SULFATE (PF) 4 MG/ML IV SOLN
4.0000 mg | Freq: Once | INTRAVENOUS | Status: AC
  Administered 2020-01-18: 4 mg via INTRAVENOUS
  Filled 2020-01-18: qty 1

## 2020-01-18 MED ORDER — SODIUM CHLORIDE 0.9% FLUSH
3.0000 mL | Freq: Once | INTRAVENOUS | Status: DC
  Filled 2020-01-18: qty 3

## 2020-01-18 NOTE — Discharge Instructions (Addendum)
Resume taking your medications with the exception of colchicine.  Follow-up with your primary doctor in the next week, and return to the ER if symptoms significantly worsen or change.

## 2020-01-18 NOTE — ED Notes (Signed)
Pt. Just returned from Radiology is alert and oreinted

## 2020-01-18 NOTE — Patient Instructions (Addendum)
I advise you be seen in ed for evaluation of passing.  out   And  Hitting head while on  Blood thinner .   You shouldn't drive  Until  Evaluated  To ok  This  I advise .  Seek care in ED.   Passing out can be from many reasons.    Some very serious.   I agree stop the colchicine .   See the  Heart doctor.   Med center  Emergency   department [address] [place], [place] [address] [place]

## 2020-01-18 NOTE — ED Triage Notes (Signed)
He passed out 2 days ago. He fell and hit a lamp. Head injury. Pain in his right hand and right hip. He passed out yesterday x 2. He has been having difficulty swallowing and chest pain since Saturday.

## 2020-01-18 NOTE — Progress Notes (Signed)
Chief Complaint  Patient presents with  . Fall    Fell Saturday AM, also 2 times on Sunday, hit head several times and has a migraine  . Flank Pain    right side pain    HPI: [name] 79 y.o. come in forSDA  PCP appt NA  Passed out total loc  3 x on past 2 days over weekend first time hit lamp on side  Right and pain and hand swelling other fell back  Has hit head but no new HA  On pain meds and med ofr rls he attributes  Passing out to colchicine given for gout . Sees cards hs of cad and sinus disease and has a pacer recently checked?  Was seen in UC this weekend ? [facility]  and had x rays they also advised  Ed he declined    He lives alone  Wife passed in February   grieving   Seem 5 days ago gvien doxycycline and having urology evaluation ROS: See pertinent positives and negatives per HPI.no cp sob change vision  Focal weakness arm leg   No fever   Past Medical History:  Diagnosis Date  . Adenomatous polyp of colon 2007  . Arthritis   . Atrial flutter ([place])   . AVM (arteriovenous malformation) 2011   a. S/p argon plasma coagulation and ablation in 2011, 2017.  . Bradycardia    a. H/o almost 7sec pause nocturnally during 2011 admission. Also has h/o fatigue with BB.  [name] CAD (coronary artery disease)    a. Nonobst disease by cath 2009. b. s/p PCI 10/2014 with DES to Novice, patent by relook 11/2014 (Brilinta changed to Plavix with improved sx).  . Chronic diastolic CHF (congestive heart failure) ([facility])   . Depression   . Diverticulosis   . Gastritis 2011  . GERD (gastroesophageal reflux disease)   . History of hiatal hernia   . Hyperlipidemia   . Hypertension   . Hypothyroidism   . Myocardial infarction ([place])   . Obstructive sleep apnea    -no cpap use  . Premature atrial contractions Holter 2016  . PVC's (premature ventricular contractions) Holter 2016  . Statin intolerance   . Transfusion history    several years ago -GI bleed    Family History    Problem Relation Age of Onset  . Leukemia Mother   . Heart disease Father   . Stroke Father   . Heart attack Father   . Alcoholism Paternal Uncle   . Alcoholism Maternal Grandfather   . Colon cancer Neg Hx   . Esophageal cancer Neg Hx     Social History   Socioeconomic History  . Marital status: Widowed    Spouse name: Not on file  . Number of children: 1  . Years of education: Not on file  . Highest education level: Not on file  Occupational History  . Occupation: Disabled    Employer: UNEMPLOYED  Tobacco Use  . Smoking status: Former Smoker    Packs/day: 2.00    Years: 50.00    Pack years: 100.00    Types: Cigarettes    Quit date: 07/23/2002    Years since quitting: 17.5  . Smokeless tobacco: Never Used  Vaping Use  . Vaping Use: Never used  Substance and Sexual Activity  . Alcohol use: No    Alcohol/week: 0.0 standard drinks  . Drug use: No  . Sexual activity: Not on file  Other Topics Concern  .  Not on file  Social History Narrative  . Not on file   Social Determinants of Health   Financial Resource Strain:   . Difficulty of Paying Living Expenses:   Food Insecurity:   . Worried About Charity fundraiser in the Last Year:   . Arboriculturist in the Last Year:   Transportation Needs:   . Film/video editor (Medical):   [name] Lack of Transportation (Non-Medical):   Physical Activity:   . Days of Exercise per Week:   . Minutes of Exercise per Session:   Stress:   . Feeling of Stress :   Social Connections:   . Frequency of Communication with Friends and Family:   . Frequency of Social Gatherings with Friends and Family:   . Attends Religious Services:   . Active Member of Clubs or Organizations:   . Attends Archivist Meetings:   [name] Marital Status:     Outpatient Medications Prior to Visit  Medication Sig Dispense Refill  . apixaban (ELIQUIS) 5 MG TABS tablet Take 1 tablet (5 mg total) by mouth 2 (two) times daily. 60 tablet 5  .  Ascorbic Acid (VITAMIN C PO) Take 4 tablets by mouth daily as needed (flu like symptopms).     [name] aspirin EC 81 MG tablet Take 81 mg by mouth daily.    . clonazePAM (KLONOPIN) 1 MG tablet TAKE 1 TABLET BY MOUTH 3 TIMES DAILY AS NEEDED FOR ANXIETY. 90 tablet 5  . diclofenac sodium (VOLTAREN) 1 % GEL Apply 1 application topically daily as needed (ankle pain). 5 Tube 10  . diltiazem (CARDIZEM CD) 120 MG 24 hr capsule Take 1 capsule (120 mg total) by mouth daily. 90 capsule 2  . doxycycline (VIBRA-TABS) 100 MG tablet Take 1 tablet (100 mg total) by mouth 2 (two) times daily. 20 tablet 0  . ezetimibe (ZETIA) 10 MG tablet Take 1 tablet (10 mg total) by mouth daily. 90 tablet 3  . fluticasone (FLONASE) 50 MCG/ACT nasal spray Place 1 spray into both nostrils daily as needed for allergies or rhinitis. 48 g 3  . furosemide (LASIX) 20 MG tablet Take 1 tablet (20 mg total) by mouth every other day. (Patient taking differently: Take 20 mg by mouth every other day. ) 15 tablet 10  . gabapentin (NEURONTIN) 100 MG capsule TAKE 1 CAPSULE BY MOUTH THREE TIMES A DAY 90 capsule 5  . levothyroxine (SYNTHROID) 175 MCG tablet TAKE 1 TABLET (175 MCG TOTAL) BY MOUTH DAILY BEFORE BREAKFAST. 90 tablet 2  . nitroGLYCERIN (NITROSTAT) 0.4 MG SL tablet Place 1 tablet (0.4 mg total) under the tongue every 5 (five) minutes as needed for chest pain. 25 tablet 3  . omeprazole (PRILOSEC) 20 MG capsule TAKE 1 CAPSULE BY MOUTH TWICE A DAY 180 capsule 3  . [START ON 01/24/2020] oxycodone (ROXICODONE) 30 MG immediate release tablet Take 1 tablet (30 mg total) by mouth every 6 (six) hours as needed for pain. 120 tablet 0  . ropinirole (REQUIP) 5 MG tablet TAKE 1 TABLET BY MOUTH TWICE A DAY 180 tablet 3  . sertraline (ZOLOFT) 50 MG tablet TAKE 1 TABLET BY MOUTH EVERY DAY 90 tablet 3  . temazepam (RESTORIL) 30 MG capsule Take 1 capsule (30 mg total) by mouth at bedtime as needed for sleep. 30 capsule 2  . colchicine 0.6 MG tablet Take 1-2  tablets every 6 hours as needed. (Patient not taking: Reported on 01/18/2020) 60 tablet 5   No  facility-administered medications prior to visit.     EXAM:  BP (!) 144/82   Pulse 96   Temp 97.8 F (36.6 C) (Temporal)   Ht 5\' 9"  (1.753 m)   Wt 218 lb (98.9 kg)   SpO2 94%   BMI 32.19 kg/m   Body mass index is 32.19 kg/m.  GENERAL: vitals reviewed and listed above, alert, oriented, appears well hydrated  in pain bending over   Favoring right side HEENT: , conjunctiva  clear, no obvious abnormalities on inspection of external nose and ears OP : masked   NECK: no obvious masses on inspection palpation  LUNGS: clear to auscultation bilaterally, ? Few crackle right based clear with deep breath  CV: HRRR, no clubbing cyanosis nl cap refill  MS: moves all extremities right hand  swelling      Good rom shoulder .   Buttocks are no bruise  Has antalgic gait .   irreg but independent .  PSYCH: nl speech      Cognition appears  Intact .  Lab Results  Component Value Date   WBC 11.4 (H) 01/10/2019   HGB 14.7 01/10/2019   HCT 46.3 01/10/2019   PLT 325 01/10/2019   GLUCOSE 101 (H) 01/10/2019   CHOL 148 12/11/2017   TRIG 113 12/11/2017   HDL 38 (L) 12/11/2017   LDLCALC 87 12/11/2017   ALT 23 12/11/2017   AST 22 12/11/2017   NA 140 01/10/2019   K 3.7 01/10/2019   CL 106 01/10/2019   CREATININE 1.35 (H) 01/10/2019   BUN 16 01/10/2019   CO2 23 01/10/2019   TSH 11.07 (H) 04/09/2017   PSA 5.81 (H) 07/12/2016   INR 1.03 06/10/2016   HGBA1C 6.2 (H) 11/15/2014   BP Readings from Last 3 Encounters:  01/18/20 (!) 144/82  01/13/20 130/70  10/21/19 (!) 150/62    ASSESSMENT AND PLAN:  Discussed the following assessment and plan:  Recurrent syncope  Anticoagulant long-term use  Restless legs syndrome  Pacemaker    He is on anticoagulant and has had loc x 3 in past  Few day  He feels from colchicine  And declining to be seen in ed    Since he hit his head     Advise  Emergent  evaluation Ct scan of head at least . Uncertain cause of syncope but has many risk factors and lives currently alone    Still in early grief ' On many meds that may be contributing to problems   Has ride no driving  Until cleared   [facility] (* says had [facility]  [facility] so [facility]  )  Call ahead   Time 42  Minutes eval record review   Call ahead ed .   Patient Instructions  I advise you be seen in ed for evaluation of passing.  out   And  Hitting head while on  Blood thinner .   You shouldn't drive  Until  Evaluated  To ok  This  I advise .  Seek care in ED.   Passing out can be from many reasons.    Some very serious.   I agree stop the colchicine .   See the  Heart doctor.   Med center  Emergency   department [address] [place], [place] [address] [place] M.D.
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
## 2020-01-18 NOTE — Telephone Encounter (Signed)
LMOVM for pt to call my direct office number.  Unscheduled, nonbillable. No episodes or alerts. Forwarding to triage to notify patient no need for manual sends. LC/CVRS

## 2020-01-21 ENCOUNTER — Telehealth: Payer: Self-pay | Admitting: Family Medicine

## 2020-01-21 MED ORDER — MELOXICAM 15 MG PO TABS
15.0000 mg | ORAL_TABLET | Freq: Every day | ORAL | 3 refills | Status: DC
Start: 2020-01-21 — End: 2020-08-26

## 2020-01-21 NOTE — Telephone Encounter (Signed)
Pt is calling in stating that he will not take colchicine 0.6 MG for the gout due to it makes him pass out on his hardwood floors and it has happen several times.  Pt would like to see if he can be prescribed meloxicam for his gout.  Pt state that he is in extreme pain and would like to see if this can be called in today.  Pharm:  [facility] in [place], [place]

## 2020-01-21 NOTE — Telephone Encounter (Signed)
Rx sent in. Spoke with the patient. He is aware his rx has been sent in.

## 2020-01-21 NOTE — Telephone Encounter (Signed)
Noted. Call in Meloxicam 15 mg to take daily, #90 with 3 rf

## 2020-01-21 NOTE — Addendum Note (Signed)
Addended by: Rebecca Eaton on: 01/21/2020 11:59 AM   Modules accepted: Orders

## 2020-01-29 ENCOUNTER — Telehealth: Payer: Self-pay

## 2020-01-29 ENCOUNTER — Telehealth: Payer: Self-pay | Admitting: *Deleted

## 2020-01-29 NOTE — Telephone Encounter (Signed)
Patient called nurse triage. Patient reports his b/p is 138/59 heartbeat 70 and light headed. No other symptoms. Patietn was advised to go to [facility] ED for evaluation since its was 2AM.   Clinic RN does not see where patient complied with advice given

## 2020-01-29 NOTE — Telephone Encounter (Signed)
I called the pt to let him know he did not need to send manual transmissions. He states he sent the transmission because he was not feeling well. His blood pressure was 155/53 and he passed out. I told him I will have the nurse to review the transmission again and give him a call back.

## 2020-01-29 NOTE — Telephone Encounter (Signed)
Second attempt:   LM on identified VM advising to seek emergency medical attention if any further syncopal episodes over the weekend. Direct number and office hours provided for call back.

## 2020-01-29 NOTE — Telephone Encounter (Signed)
LMOM requesting call back to DC. Direct number provided. Pt is overdue for f/u with Dr. Belva Chimes APP. Message sent to scheduler.  Transmission from 01/27/20 at 07:26 reviewed. Normal PPM function. Presenting rhythm AS/VS and AP/VS 60s, PACs and PVC noted. AP 18.4%, VP 25.3%. Lead trends stable. Normal histograms. No episodes. No additional transmissions received since 7/7.  Appears pt also called PCP office nurse triage about episode of lightheadedness early this AM and was instructed to go to ED for evaluation.

## 2020-02-01 NOTE — Telephone Encounter (Signed)
Closing encounter/ patient scheduled for f/u visit with Oda Kilts tomorrow.

## 2020-02-01 NOTE — Telephone Encounter (Signed)
Noted  

## 2020-02-02 NOTE — Progress Notes (Deleted)
Electrophysiology Office Note Date: 02/02/2020  ID:  [name], DOB 21-Jun-1941, MRN [id number]  PCP: [name], MD Primary Cardiologist: [name], MD Electrophysiologist: Virl Axe, MD   CC: Pacemaker follow-up  [name] is a 79 y.o. male seen today for Virl Axe, MD for acute visit due to syncopal episodes and overdue follow up.  Since last being seen in our clinic the patient reports doing ***.  he denies chest pain, palpitations, dyspnea, PND, orthopnea, nausea, vomiting, dizziness, syncope, edema, weight gain, or early satiety.  Device History: Medtronic Dual Chamber PPM implanted 12/2016 for SND  Past Medical History:  Diagnosis Date  . Adenomatous polyp of colon 2007  . Arthritis   . Atrial flutter ([place])   . AVM (arteriovenous malformation) 2011   a. S/p argon plasma coagulation and ablation in 2011, 2017.  . Bradycardia    a. H/o almost 7sec pause nocturnally during 2011 admission. Also has h/o fatigue with BB.  [name] CAD (coronary artery disease)    a. Nonobst disease by cath 2009. b. s/p PCI 10/2014 with DES to [facility], patent by relook 11/2014 (Brilinta changed to Plavix with improved sx).  . Chronic diastolic CHF (congestive heart failure) ([place])   . Depression   . Diverticulosis   . Gastritis 2011  . GERD (gastroesophageal reflux disease)   . History of hiatal hernia   . Hyperlipidemia   . Hypertension   . Hypothyroidism   . Myocardial infarction ([place])   . Obstructive sleep apnea    -no cpap use  . Premature atrial contractions Holter 2016  . PVC's (premature ventricular contractions) Holter 2016  . Statin intolerance   . Transfusion history    several years ago -GI bleed   Past Surgical History:  Procedure Laterality Date  . ANKLE SURGERY Left   . APPENDECTOMY    . CARDIAC CATHETERIZATION N/A 11/26/2014   Procedure: Left Heart Cath and Coronary Angiography;  Surgeon: Burnell Blanks, MD;  Location: [facility];  Service: Cardiovascular;  Laterality: N/A;  . CHOLECYSTECTOMY N/A 08/23/2016   Procedure: LAPAROSCOPIC CHOLECYSTECTOMY;  Surgeon: Coralie Keens, MD;  Location: [facility];  Service: General;  Laterality: N/A;  . COLONOSCOPY  08-23-05   per Dr. Deatra Ina, adenomatous polyps, repeat in 5 yrs   . COLONOSCOPY WITH PROPOFOL N/A 12/06/2015   Procedure: COLONOSCOPY WITH PROPOFOL;  Surgeon: Doran Stabler, MD;  Location: [facility];  Service: Gastroenterology;  Laterality: N/A;  . ENTEROSCOPY N/A 12/06/2015   Procedure: ENTEROSCOPY;  Surgeon: Doran Stabler, MD;  Location: [facility];  Service: Gastroenterology;  Laterality: N/A;  . ESOPHAGOGASTRODUODENOSCOPY  08-23-05   per Dr. Deatra Ina, cauterized jejunal AVMs   . HERNIA REPAIR    . HOT HEMOSTASIS N/A 12/06/2015   Procedure: HOT HEMOSTASIS (ARGON PLASMA COAGULATION/BICAP);  Surgeon: Doran Stabler, MD;  Location: [facility];  Service: Gastroenterology;  Laterality: N/A;  . LEFT HEART CATHETERIZATION WITH CORONARY ANGIOGRAM N/A 09/08/2012   Procedure: LEFT HEART CATHETERIZATION WITH CORONARY ANGIOGRAM;  Surgeon: Burnell Blanks, MD;  Location[facility] C[facility];  Service: Cardiovascular;  Laterality: N/A;  . LEFT HEART CATHETERIZATION WITH CORONARY ANGIOGRAM N/A 11/16/2014   Procedure: LEFT HEART CATHETERIZATION WITH CORONARY ANGIOGRAM;  Surgeon: Leonie Man, MD;  Location: [facility] LAB;  Service: Cardiovascular;  Laterality: N/A;  . PACEMAKER IMPLANT N/A 12/24/2016   Procedure: Pacemaker Implant;  Surgeon: Deboraha Sprang, MD;  Location: [facility];  Service: Cardiovascular;  Laterality: N/A;  . SKIN GRAFT Right    leg  . TONSILLECTOMY      Current Outpatient Medications  Medication Sig Dispense Refill  . apixaban (ELIQUIS) 5 MG TABS tablet Take 1 tablet (5 mg total) by mouth 2 (two) times daily. 60 tablet 5  . Ascorbic Acid (VITAMIN C PO) Take 4 tablets by mouth daily as needed (flu like symptopms).     [name] aspirin EC 81 MG  tablet Take 81 mg by mouth daily.    . clonazePAM (KLONOPIN) 1 MG tablet TAKE 1 TABLET BY MOUTH 3 TIMES DAILY AS NEEDED FOR ANXIETY. 90 tablet 5  . colchicine 0.6 MG tablet Take 1-2 tablets every 6 hours as needed. (Patient not taking: Reported on 01/18/2020) 60 tablet 5  . diclofenac sodium (VOLTAREN) 1 % GEL Apply 1 application topically daily as needed (ankle pain). 5 Tube 10  . diltiazem (CARDIZEM CD) 120 MG 24 hr capsule Take 1 capsule (120 mg total) by mouth daily. 90 capsule 2  . doxycycline (VIBRA-TABS) 100 MG tablet Take 1 tablet (100 mg total) by mouth 2 (two) times daily. 20 tablet 0  . ezetimibe (ZETIA) 10 MG tablet Take 1 tablet (10 mg total) by mouth daily. 90 tablet 3  . fluticasone (FLONASE) 50 MCG/ACT nasal spray Place 1 spray into both nostrils daily as needed for allergies or rhinitis. 48 g 3  . furosemide (LASIX) 20 MG tablet Take 1 tablet (20 mg total) by mouth every other day. (Patient taking differently: Take 20 mg by mouth every other day. ) 15 tablet 10  . gabapentin (NEURONTIN) 100 MG capsule TAKE 1 CAPSULE BY MOUTH THREE TIMES A DAY 90 capsule 5  . levothyroxine (SYNTHROID) 175 MCG tablet TAKE 1 TABLET (175 MCG TOTAL) BY MOUTH DAILY BEFORE BREAKFAST. 90 tablet 2  . meloxicam (MOBIC) 15 MG tablet Take 1 tablet (15 mg total) by mouth daily. 90 tablet 3  . nitroGLYCERIN (NITROSTAT) 0.4 MG SL tablet Place 1 tablet (0.4 mg total) under the tongue every 5 (five) minutes as needed for chest pain. 25 tablet 3  . omeprazole (PRILOSEC) 20 MG capsule TAKE 1 CAPSULE BY MOUTH TWICE A DAY 180 capsule 3  . oxycodone (ROXICODONE) 30 MG immediate release tablet Take 1 tablet (30 mg total) by mouth every 6 (six) hours as needed for pain. 120 tablet 0  . ropinirole (REQUIP) 5 MG tablet TAKE 1 TABLET BY MOUTH TWICE A DAY 180 tablet 3  . sertraline (ZOLOFT) 50 MG tablet TAKE 1 TABLET BY MOUTH EVERY DAY 90 tablet 3  . temazepam (RESTORIL) 30 MG capsule Take 1 capsule (30 mg total) by mouth at  bedtime as needed for sleep. 30 capsule 2   No current facility-administered medications for this visit.    Allergies:   Brilinta [ticagrelor], Colchicine, Metoprolol tartrate, Mirapex [pramipexole dihydrochloride], Shellfish allergy, Statins, Zolpidem, Coconut oil, Levaquin [levofloxacin], and Trazodone and nefazodone   Social History: Social History   Socioeconomic History  . Marital status: Widowed    Spouse name: Not on file  . Number of children: 1  . Years of education: Not on file  . Highest education level: Not on file  Occupational History  . Occupation: Disabled    Employer: UNEMPLOYED  Tobacco Use  . Smoking status: Former Smoker    Packs/day: 2.00    Years: 50.00    Pack years: 100.00    Types: Cigarettes    Quit date: 07/23/2002  Years since quitting: 17.5  . Smokeless tobacco: Never Used  Vaping Use  . Vaping Use: Never used  Substance and Sexual Activity  . Alcohol use: No    Alcohol/week: 0.0 standard drinks  . Drug use: No  . Sexual activity: Not on file  Other Topics Concern  . Not on file  Social History Narrative  . Not on file   Social Determinants of Health   Financial Resource Strain:   . Difficulty of Paying Living Expenses:   Food Insecurity:   . Worried About Charity fundraiser in the Last Year:   . Arboriculturist in the Last Year:   Transportation Needs:   . Film/video editor (Medical):   [name] Lack of Transportation (Non-Medical):   Physical Activity:   . Days of Exercise per Week:   . Minutes of Exercise per Session:   Stress:   . Feeling of Stress :   Social Connections:   . Frequency of Communication with Friends and Family:   . Frequency of Social Gatherings with Friends and Family:   . Attends Religious Services:   . Active Member of Clubs or Organizations:   . Attends Archivist Meetings:   [name] Marital Status:   Intimate Partner Violence:   . Fear of Current or Ex-Partner:   . Emotionally Abused:   [name]  Physically Abused:   . Sexually Abused:     Family History: Family History  Problem Relation Age of Onset  . Leukemia Mother   . Heart disease Father   . Stroke Father   . Heart attack Father   . Alcoholism Paternal Uncle   . Alcoholism Maternal Grandfather   . Colon cancer Neg Hx   . Esophageal cancer Neg Hx      Review of Systems: All other systems reviewed and are otherwise negative except as noted above.  Physical Exam: There were no vitals filed for this visit.   GEN- The patient is well appearing, alert and oriented x 3 today.   HEENT: normocephalic, atraumatic; sclera clear, conjunctiva pink; hearing intact; oropharynx clear; neck supple  Lungs- Clear to ausculation bilaterally, normal work of breathing.  No wheezes, rales, rhonchi Heart- Regular rate and rhythm, no murmurs, rubs or gallops  GI- soft, non-tender, non-distended, bowel sounds present  Extremities- no clubbing, cyanosis, or edema  MS- no significant deformity or atrophy Skin- warm and dry, no rash or lesion; PPM pocket well healed Psych- euthymic mood, full affect Neuro- strength and sensation are intact  PPM Interrogation- reviewed in detail today,  See PACEART report  EKG:  EKG {ACTION; IS/IS [id number]} ordered today. The ekg ordered today shows ***  Recent Labs: 01/18/2020: BUN 19; Creatinine, Ser 1.16; Hemoglobin 14.1; Platelets 283; Potassium 3.9; Sodium 137   Wt Readings from Last 3 Encounters:  01/18/20 218 lb 0.6 oz (98.9 kg)  01/18/20 218 lb (98.9 kg)  01/13/20 223 lb (101.2 kg)     Other studies Reviewed: Additional studies/ records that were reviewed today include: Previous EP office notes, Previous remote checks, Most recent labwork.   Assessment and Plan:  1. SND s/p Medtronic PPM  Normal PPM function See Pace Art report No changes today  Syncope-presyncope  Atrial flutter  Coronary artery disease with prior stenting   Current medicines are reviewed at length  with the patient today.   The patient {ACTIONS; HAS/DOES NOT HAVE:19233} concerns regarding his medicines.  The following changes were made today:  {NONE DEFAULTED:18576::"none"}  Labs/ tests ordered today include: *** No orders of the defined types were placed in this encounter.    Disposition:   Follow up with {Blank single:19197::"Dr. Allred","Dr. Arlan Organ. Klein","Dr. Camnitz","EP APP"} in *** {Blank single:19197::"Months","Weeks"}    Signed, Greene Diodato, PA-C  02/02/2020 3:14 PM  [address] [place] [address] [phone] (office) [phone] (fax)

## 2020-02-03 ENCOUNTER — Encounter: Payer: Medicare HMO | Admitting: Student

## 2020-02-15 ENCOUNTER — Other Ambulatory Visit: Payer: Self-pay | Admitting: Family Medicine

## 2020-02-16 DIAGNOSIS — N4341 Spermatocele of epididymis, single: Secondary | ICD-10-CM | POA: Diagnosis not present

## 2020-02-17 ENCOUNTER — Telehealth: Payer: Self-pay | Admitting: *Deleted

## 2020-02-17 NOTE — Telephone Encounter (Signed)
Roman from [facility] called to see of office received PA form for patient that was faxed over 2 days ago. Roman stated that he will fax another form over and would need for it to be signed by Dr. Sarajane Jews and faxed back to him.

## 2020-02-17 NOTE — Telephone Encounter (Signed)
Noted. No form has been received yet. Will keep an eye out for this.

## 2020-02-18 ENCOUNTER — Encounter (HOSPITAL_COMMUNITY): Payer: Self-pay

## 2020-02-18 ENCOUNTER — Emergency Department (HOSPITAL_COMMUNITY)
Admission: EM | Admit: 2020-02-18 | Discharge: 2020-02-18 | Disposition: A | Discharge status: Home / Self Care | Payer: Medicare HMO | Attending: Emergency Medicine | Admitting: Emergency Medicine

## 2020-02-18 DIAGNOSIS — N50812 Left testicular pain: Secondary | ICD-10-CM | POA: Insufficient documentation

## 2020-02-18 DIAGNOSIS — N50811 Right testicular pain: Secondary | ICD-10-CM | POA: Diagnosis not present

## 2020-02-18 DIAGNOSIS — N4400 Torsion of testis, unspecified: Secondary | ICD-10-CM | POA: Diagnosis not present

## 2020-02-18 DIAGNOSIS — Z5321 Procedure and treatment not carried out due to patient leaving prior to being seen by health care provider: Secondary | ICD-10-CM | POA: Insufficient documentation

## 2020-02-18 LAB — BASIC METABOLIC PANEL
Anion gap: 9 (ref 5–15)
BUN: 13 mg/dL (ref 8–23)
CO2: 26 mmol/L (ref 22–32)
Calcium: 8.8 mg/dL — ABNORMAL LOW (ref 8.9–10.3)
Chloride: 100 mmol/L (ref 98–111)
Creatinine, Ser: 1.22 mg/dL (ref 0.61–1.24)
GFR calc Af Amer: >60 mL/min (ref >60)
GFR calc non Af Amer: 56 mL/min — ABNORMAL LOW (ref >60)
Glucose, Bld: 108 mg/dL — ABNORMAL HIGH (ref 70–99)
Potassium: 4 mmol/L (ref 3.5–5.1)
Sodium: 135 mmol/L (ref 135–145)

## 2020-02-18 LAB — URINALYSIS, ROUTINE W REFLEX MICROSCOPIC
Bilirubin Urine: NEGATIVE
Glucose, UA: NEGATIVE mg/dL
Hgb urine dipstick: NEGATIVE
Ketones, ur: NEGATIVE mg/dL
Leukocytes,Ua: NEGATIVE
Nitrite: NEGATIVE
Protein, ur: NEGATIVE mg/dL
Specific Gravity, Urine: 1.023 (ref 1.005–1.030)
pH: 5 (ref 5.0–8.0)

## 2020-02-18 LAB — CBC WITH DIFFERENTIAL/PLATELET
Abs Immature Granulocytes: 0.13 10*3/uL — ABNORMAL HIGH (ref 0.00–0.07)
Basophils Absolute: 0.1 10*3/uL (ref 0.0–0.1)
Basophils Relative: 0 %
Eosinophils Absolute: 0 10*3/uL (ref 0.0–0.5)
Eosinophils Relative: 0 %
HCT: 45.7 % (ref 39.0–52.0)
Hemoglobin: 14.2 g/dL (ref 13.0–17.0)
Immature Granulocytes: 1 %
Lymphocytes Relative: 8 %
Lymphs Abs: 1.2 10*3/uL (ref 0.7–4.0)
MCH: 28.1 pg (ref 26.0–34.0)
MCHC: 31.1 g/dL (ref 30.0–36.0)
MCV: 90.5 fL (ref 80.0–100.0)
Monocytes Absolute: 1.3 10*3/uL — ABNORMAL HIGH (ref 0.1–1.0)
Monocytes Relative: 8 %
Neutro Abs: 13.1 10*3/uL — ABNORMAL HIGH (ref 1.7–7.7)
Neutrophils Relative %: 83 %
Platelets: 251 10*3/uL (ref 150–400)
RBC: 5.05 MIL/uL (ref 4.22–5.81)
RDW: 14.7 % (ref 11.5–15.5)
WBC: 15.9 10*3/uL — ABNORMAL HIGH (ref 4.0–10.5)
nRBC: 0 % (ref 0.0–0.2)

## 2020-02-18 NOTE — ED Notes (Signed)
LWBS 

## 2020-02-18 NOTE — ED Triage Notes (Signed)
Patient presents to the ED with c/o testicular pain. +bilat testicular swelling. Patient sent from urology office for ultrasound to r/o hernia. Denies discoloration of testicles, hematuria, dysuria, fever, chills. Patient appears uncomfortable in triage, respirs even and unlabored.

## 2020-02-20 ENCOUNTER — Emergency Department (HOSPITAL_BASED_OUTPATIENT_CLINIC_OR_DEPARTMENT_OTHER): Payer: Medicare HMO

## 2020-02-20 ENCOUNTER — Emergency Department (HOSPITAL_BASED_OUTPATIENT_CLINIC_OR_DEPARTMENT_OTHER)
Admission: EM | Admit: 2020-02-20 | Discharge: 2020-02-20 | Disposition: A | Discharge status: Home / Self Care | Payer: Medicare HMO | Attending: Emergency Medicine | Admitting: Emergency Medicine

## 2020-02-20 ENCOUNTER — Other Ambulatory Visit: Payer: Self-pay

## 2020-02-20 ENCOUNTER — Encounter (HOSPITAL_BASED_OUTPATIENT_CLINIC_OR_DEPARTMENT_OTHER): Payer: Self-pay

## 2020-02-20 DIAGNOSIS — R112 Nausea with vomiting, unspecified: Secondary | ICD-10-CM | POA: Diagnosis not present

## 2020-02-20 DIAGNOSIS — I2510 Atherosclerotic heart disease of native coronary artery without angina pectoris: Secondary | ICD-10-CM | POA: Insufficient documentation

## 2020-02-20 DIAGNOSIS — K219 Gastro-esophageal reflux disease without esophagitis: Secondary | ICD-10-CM | POA: Diagnosis not present

## 2020-02-20 DIAGNOSIS — K529 Noninfective gastroenteritis and colitis, unspecified: Secondary | ICD-10-CM | POA: Diagnosis not present

## 2020-02-20 DIAGNOSIS — Z79899 Other long term (current) drug therapy: Secondary | ICD-10-CM | POA: Diagnosis not present

## 2020-02-20 DIAGNOSIS — Z87891 Personal history of nicotine dependence: Secondary | ICD-10-CM | POA: Insufficient documentation

## 2020-02-20 DIAGNOSIS — E039 Hypothyroidism, unspecified: Secondary | ICD-10-CM | POA: Insufficient documentation

## 2020-02-20 DIAGNOSIS — R109 Unspecified abdominal pain: Secondary | ICD-10-CM | POA: Diagnosis not present

## 2020-02-20 DIAGNOSIS — K4091 Unilateral inguinal hernia, without obstruction or gangrene, recurrent: Secondary | ICD-10-CM | POA: Diagnosis not present

## 2020-02-20 DIAGNOSIS — I5032 Chronic diastolic (congestive) heart failure: Secondary | ICD-10-CM | POA: Diagnosis not present

## 2020-02-20 DIAGNOSIS — Z7982 Long term (current) use of aspirin: Secondary | ICD-10-CM | POA: Insufficient documentation

## 2020-02-20 DIAGNOSIS — K469 Unspecified abdominal hernia without obstruction or gangrene: Secondary | ICD-10-CM | POA: Diagnosis not present

## 2020-02-20 DIAGNOSIS — K4090 Unilateral inguinal hernia, without obstruction or gangrene, not specified as recurrent: Secondary | ICD-10-CM

## 2020-02-20 DIAGNOSIS — R197 Diarrhea, unspecified: Secondary | ICD-10-CM | POA: Diagnosis not present

## 2020-02-20 DIAGNOSIS — K5730 Diverticulosis of large intestine without perforation or abscess without bleeding: Secondary | ICD-10-CM | POA: Diagnosis not present

## 2020-02-20 DIAGNOSIS — I110 Hypertensive heart disease with heart failure: Secondary | ICD-10-CM | POA: Diagnosis not present

## 2020-02-20 DIAGNOSIS — K429 Umbilical hernia without obstruction or gangrene: Secondary | ICD-10-CM | POA: Diagnosis not present

## 2020-02-20 LAB — COMPREHENSIVE METABOLIC PANEL
ALT: 22 U/L (ref 0–44)
AST: 26 U/L (ref 15–41)
Albumin: 3.9 g/dL (ref 3.5–5.0)
Alkaline Phosphatase: 55 U/L (ref 38–126)
Anion gap: 11 (ref 5–15)
BUN: 22 mg/dL (ref 8–23)
CO2: 23 mmol/L (ref 22–32)
Calcium: 8.6 mg/dL — ABNORMAL LOW (ref 8.9–10.3)
Chloride: 103 mmol/L (ref 98–111)
Creatinine, Ser: 1.2 mg/dL (ref 0.61–1.24)
GFR calc Af Amer: >60 mL/min (ref >60)
GFR calc non Af Amer: 58 mL/min — ABNORMAL LOW (ref >60)
Glucose, Bld: 107 mg/dL — ABNORMAL HIGH (ref 70–99)
Potassium: 3.5 mmol/L (ref 3.5–5.1)
Sodium: 137 mmol/L (ref 135–145)
Total Bilirubin: 0.9 mg/dL (ref 0.3–1.2)
Total Protein: 7.5 g/dL (ref 6.5–8.1)

## 2020-02-20 LAB — URINALYSIS, ROUTINE W REFLEX MICROSCOPIC
Bilirubin Urine: NEGATIVE
Glucose, UA: NEGATIVE mg/dL
Hgb urine dipstick: NEGATIVE
Ketones, ur: NEGATIVE mg/dL
Leukocytes,Ua: NEGATIVE
Nitrite: NEGATIVE
Protein, ur: NEGATIVE mg/dL
Specific Gravity, Urine: 1.025 (ref 1.005–1.030)
pH: 6 (ref 5.0–8.0)

## 2020-02-20 LAB — CBC WITH DIFFERENTIAL/PLATELET
Abs Immature Granulocytes: 0.15 10*3/uL — ABNORMAL HIGH (ref 0.00–0.07)
Basophils Absolute: 0.1 10*3/uL (ref 0.0–0.1)
Basophils Relative: 1 %
Eosinophils Absolute: 0.6 10*3/uL — ABNORMAL HIGH (ref 0.0–0.5)
Eosinophils Relative: 7 %
HCT: 46.9 % (ref 39.0–52.0)
Hemoglobin: 15 g/dL (ref 13.0–17.0)
Immature Granulocytes: 2 %
Lymphocytes Relative: 25 %
Lymphs Abs: 2.2 10*3/uL (ref 0.7–4.0)
MCH: 28.5 pg (ref 26.0–34.0)
MCHC: 32 g/dL (ref 30.0–36.0)
MCV: 89 fL (ref 80.0–100.0)
Monocytes Absolute: 1.8 10*3/uL — ABNORMAL HIGH (ref 0.1–1.0)
Monocytes Relative: 21 %
Neutro Abs: 3.8 10*3/uL (ref 1.7–7.7)
Neutrophils Relative %: 44 %
Platelets: 265 10*3/uL (ref 150–400)
RBC: 5.27 MIL/uL (ref 4.22–5.81)
RDW: 14.4 % (ref 11.5–15.5)
WBC: 8.7 10*3/uL (ref 4.0–10.5)
nRBC: 0 % (ref 0.0–0.2)

## 2020-02-20 LAB — LACTIC ACID, PLASMA: Lactic Acid, Venous: 0.9 mmol/L (ref 0.5–1.9)

## 2020-02-20 MED ORDER — SODIUM CHLORIDE 0.9 % IV BOLUS
500.0000 mL | Freq: Once | INTRAVENOUS | Status: AC
  Administered 2020-02-20: 500 mL via INTRAVENOUS

## 2020-02-20 MED ORDER — FENTANYL CITRATE (PF) 100 MCG/2ML IJ SOLN
50.0000 ug | INTRAMUSCULAR | Status: DC | PRN
  Administered 2020-02-20: 50 ug via NASAL
  Filled 2020-02-20: qty 2

## 2020-02-20 MED ORDER — ONDANSETRON HCL 4 MG/2ML IJ SOLN
4.0000 mg | Freq: Once | INTRAMUSCULAR | Status: AC
  Administered 2020-02-20: 4 mg via INTRAVENOUS
  Filled 2020-02-20: qty 2

## 2020-02-20 MED ORDER — OXYCODONE-ACETAMINOPHEN 5-325 MG PO TABS
2.0000 | ORAL_TABLET | Freq: Once | ORAL | Status: AC
  Administered 2020-02-20: 2 via ORAL
  Filled 2020-02-20: qty 2

## 2020-02-20 MED ORDER — AZITHROMYCIN 250 MG PO TABS: 500.0000 mg | ORAL_TABLET | Freq: Every day | ORAL | 0 refills | Status: AC

## 2020-02-20 MED ORDER — IOHEXOL 300 MG/ML  SOLN
100.0000 mL | Freq: Once | INTRAMUSCULAR | Status: AC | PRN
  Administered 2020-02-20: 100 mL via INTRAVENOUS

## 2020-02-20 MED ORDER — HYDROMORPHONE HCL 1 MG/ML IJ SOLN
1.0000 mg | Freq: Once | INTRAMUSCULAR | Status: AC
  Administered 2020-02-20: 1 mg via INTRAVENOUS
  Filled 2020-02-20: qty 1

## 2020-02-20 NOTE — ED Notes (Signed)
Pt asked if he could safely drive home after medications that were administered in ED. Pt instructed that those medications are narcotic and that he should not drive home. Pt on phone with family member and states they will come pick him up.

## 2020-02-20 NOTE — ED Triage Notes (Signed)
Pt was evaluated for hernia into testicle last week. Pt states pain in his testicle continues to get worse and he is having watery and bloody BMs.
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
## 2020-02-22 ENCOUNTER — Telehealth: Payer: Self-pay | Admitting: *Deleted

## 2020-02-22 NOTE — Telephone Encounter (Signed)
Patient called after hours line on : 02/20/2020 at  11:50:06 am. Caller states that he is having rectal bleeding, just had a hernia pushed back up inside the other day. Caller states he has been bed ridden for 2 days.Caller states the Dr was unable to reduce the the hernia. Caller states he has been having chills, and abd pain anddiarrhea. Caller states the stool is all water and blood. Caller states has been vomiting as well. Patient instructed to ED by nurse triage. Patient seen in ER on 02/20/2020.

## 2020-02-23 ENCOUNTER — Telehealth: Payer: Self-pay | Admitting: Family Medicine

## 2020-02-23 ENCOUNTER — Other Ambulatory Visit: Payer: Self-pay

## 2020-02-23 ENCOUNTER — Telehealth (INDEPENDENT_AMBULATORY_CARE_PROVIDER_SITE_OTHER): Payer: Medicare HMO | Admitting: Family Medicine

## 2020-02-23 ENCOUNTER — Encounter: Payer: Self-pay | Admitting: Family Medicine

## 2020-02-23 DIAGNOSIS — M5440 Lumbago with sciatica, unspecified side: Secondary | ICD-10-CM | POA: Diagnosis not present

## 2020-02-23 DIAGNOSIS — F1190 Opioid use, unspecified, uncomplicated: Secondary | ICD-10-CM

## 2020-02-23 MED ORDER — OXYCODONE HCL 30 MG PO TABS: 30.0000 mg | ORAL_TABLET | Freq: Four times a day (QID) | ORAL | 0 refills | Status: DC | PRN

## 2020-02-23 NOTE — Progress Notes (Signed)
   Subjective:    Patient ID: [name], male    DOB: 1941/04/05, 79 y.o.   MRN: [id number]  HPI Virtual Visit via Telephone Note  I connected with the patient on 02/23/20 at  9:30 AM EDT by telephone and verified that I am speaking with the correct person using two identifiers.   I discussed the limitations, risks, security and privacy concerns of performing an evaluation and management service by telephone and the availability of in person appointments. I also discussed with the patient that there may be a patient responsible charge related to this service. The patient expressed understanding and agreed to proceed.  Location patient: home Location provider: work or home office Participants present for the call: patient, provider Patient did not have a visit in the prior 7 days to address this/these issue(s).   History of Present Illness: Here for pain management. He is doing well.  Indication for chronic opioid: low back pain Medication and dose: Oxycodone 30 mg # pills per month: 120 Last UDS date: 08-26-18 Opioid Treatment Agreement signed (Y/N): 08-26-17 Opioid Treatment Agreement last reviewed with patient:  02-23-20 N[facility]SRS reviewed this encounter (include red flags): Yes    Observations/Objective: Patient sounds cheerful and well on the phone. I do not appreciate any SOB. Speech and thought processing are grossly intact. Patient reported vitals:  Assessment and Plan: Pain management, meds were refilled.  [name], MD   Follow Up Instructions:     [phone] 5-10 [phone] 11-20 9443 21-30 I did not refer this patient for an OV in the next 24 hours for this/these issue(s).  I discussed the assessment and treatment plan with the patient. The patient was provided an opportunity to ask questions and all were answered. The patient agreed with the plan and demonstrated an understanding of the instructions.   The patient was advised to call back or seek an in-person  evaluation if the symptoms worsen or if the condition fails to improve as anticipated.  I provided 12 minutes of non-face-to-face time during this encounter.   [name], MD    Review of Systems     Objective:   Physical Exam        Assessment & Plan:

## 2020-02-23 NOTE — Telephone Encounter (Signed)
Spoke with [facility]. They are resending the fax.

## 2020-02-23 NOTE — Telephone Encounter (Signed)
[name] is calling in stating that he needs to have the Rx for the hip support faxed by tomorrow and also would like to have a call back.

## 2020-02-23 NOTE — Telephone Encounter (Signed)
Fax has been received. Per Dr. Sarajane Jews the patient does not need this. Will call the patient directly.

## 2020-02-23 NOTE — Telephone Encounter (Signed)
Called back and spoke with [name]. He is re-faxing the forms. Confirmed that he had the correct fax number.

## 2020-02-23 NOTE — Telephone Encounter (Signed)
No I never got it

## 2020-02-24 NOTE — Telephone Encounter (Signed)
Left message for patient to call back  

## 2020-02-24 NOTE — Telephone Encounter (Signed)
Spoke with the patient. He stated that he does not need these supplies. He knows nothing about this and does not deal with this company. No information will be given. Nothing further needed.

## 2020-02-25 NOTE — Telephone Encounter (Signed)
ATC, unable to leave the voicemail. Dr. Aletha Halim not sign off on these orders. Please let Roman know when he calls back.

## 2020-02-25 NOTE — Telephone Encounter (Addendum)
Roman returned the call to [place] and asked for a return call [phone] and just ask to speak with him.

## 2020-02-29 NOTE — Telephone Encounter (Signed)
Roman call the office again waiting and wanting the paperwork back about the device for his hip.

## 2020-03-01 NOTE — Telephone Encounter (Signed)
ATC, unable to leave a message for [name].  Per Dr. Sarajane Jews the patient does not need this and he will not sign these orders.  Per the patient, he knows nothing about these orders and does not want this ordered for him  Please let [name] know when he calls back.

## 2020-03-04 NOTE — Telephone Encounter (Signed)
Roman called back in and spoke with Glass blower/designer. He is aware that the patient does not need these orders. Nothing further needed.

## 2020-03-10 NOTE — Progress Notes (Deleted)
Electrophysiology Office Note Date: 03/10/2020  ID:  [name], DOB 12-18-1940, MRN [id number]  PCP: [name], MD Primary Cardiologist: [name], MD Electrophysiologist: Virl Axe, MD   CC: Pacemaker follow-up  [name] is a 79 y.o. male seen today for Virl Axe, MD for routine electrophysiology followup.  Since last being seen in our clinic the patient reports doing ***.  he denies chest pain, palpitations, dyspnea, PND, orthopnea, nausea, vomiting, dizziness, syncope, edema, weight gain, or early satiety.  Device History: Medtronic Dual Chamber PPM implanted 12/2016 for SND  Past Medical History:  Diagnosis Date  . Adenomatous polyp of colon 2007  . Arthritis   . Atrial flutter ([place])   . AVM (arteriovenous malformation) 2011   a. S/p argon plasma coagulation and ablation in 2011, 2017.  . Bradycardia    a. H/o almost 7sec pause nocturnally during 2011 admission. Also has h/o fatigue with BB.  [name] CAD (coronary artery disease)    a. Nonobst disease by cath 2009. b. s/p PCI 10/2014 with DES to [place], patent by relook 11/2014 (Brilinta changed to Plavix with improved sx).  . Chronic diastolic CHF (congestive heart failure) ([place])   . Depression   . Diverticulosis   . Gastritis 2011  . GERD (gastroesophageal reflux disease)   . History of hiatal hernia   . Hyperlipidemia   . Hypertension   . Hypothyroidism   . Myocardial infarction ([place])   . Obstructive sleep apnea    -no cpap use  . Premature atrial contractions Holter 2016  . PVC's (premature ventricular contractions) Holter 2016  . Statin intolerance   . Transfusion history    several years ago -GI bleed   Past Surgical History:  Procedure Laterality Date  . ANKLE SURGERY Left   . APPENDECTOMY    . CARDIAC CATHETERIZATION N/A 11/26/2014   Procedure: Left Heart Cath and Coronary Angiography;  Surgeon: Burnell Blanks, MD;  Location: [facility];  Service:  Cardiovascular;  Laterality: N/A;  . CHOLECYSTECTOMY N/A 08/23/2016   Procedure: LAPAROSCOPIC CHOLECYSTECTOMY;  Surgeon: Coralie Keens, MD;  Location: [facility];  Service: General;  Laterality: N/A;  . COLONOSCOPY  08-23-05   per Dr. Deatra Ina, adenomatous polyps, repeat in 5 yrs   . COLONOSCOPY WITH PROPOFOL N/A 12/06/2015   Procedure: COLONOSCOPY WITH PROPOFOL;  Surgeon: Doran Stabler, MD;  Location: [facility];  Service: Gastroenterology;  Laterality: N/A;  . ENTEROSCOPY N/A 12/06/2015   Procedure: ENTEROSCOPY;  Surgeon: Doran Stabler, MD;  Location: [facility];  Service: Gastroenterology;  Laterality: N/A;  . ESOPHAGOGASTRODUODENOSCOPY  08-23-05   per Dr. Deatra Ina, cauterized jejunal AVMs   . HERNIA REPAIR    . HOT HEMOSTASIS N/A 12/06/2015   Procedure: HOT HEMOSTASIS (ARGON PLASMA COAGULATION/BICAP);  Surgeon: Doran Stabler, MD;  Location: [facility];  Service: Gastroenterology;  Laterality: N/A;  . LEFT HEART CATHETERIZATION WITH CORONARY ANGIOGRAM N/A 09/08/2012   Procedure: LEFT HEART CATHETERIZATION WITH CORONARY ANGIOGRAM;  Surgeon: Burnell Blanks, MD;  Location: [facility];  Service: Cardiovascular;  Laterality: N/A;  . LEFT HEART CATHETERIZATION WITH CORONARY ANGIOGRAM N/A 11/16/2014   Procedure: LEFT HEART CATHETERIZATION WITH CORONARY ANGIOGRAM;  Surgeon: Leonie Man, MD;  Location: [facility] CATH LAB;  Service: Cardiovascular;  Laterality: N/A;  . PACEMAKER IMPLANT N/A 12/24/2016   Procedure: Pacemaker Implant;  Surgeon: Deboraha Sprang, MD;  Location: [place] [facility];  Service: Cardiovascular;  Laterality: N/A;  .  SKIN GRAFT Right    leg  . TONSILLECTOMY      Current Outpatient Medications  Medication Sig Dispense Refill  . apixaban (ELIQUIS) 5 MG TABS tablet Take 1 tablet (5 mg total) by mouth 2 (two) times daily. 60 tablet 5  . Ascorbic Acid (VITAMIN C PO) Take 4 tablets by mouth daily as needed (flu like symptopms).     [name] aspirin EC 81 MG tablet Take 81 mg by  mouth daily.    . clonazePAM (KLONOPIN) 1 MG tablet TAKE 1 TABLET BY MOUTH 3 TIMES DAILY AS NEEDED FOR ANXIETY. 90 tablet 5  . colchicine 0.6 MG tablet Take 1-2 tablets every 6 hours as needed. (Patient not taking: Reported on 01/18/2020) 60 tablet 5  . diclofenac sodium (VOLTAREN) 1 % GEL Apply 1 application topically daily as needed (ankle pain). 5 Tube 10  . diltiazem (CARDIZEM CD) 120 MG 24 hr capsule Take 1 capsule (120 mg total) by mouth daily. 90 capsule 2  . doxycycline (VIBRA-TABS) 100 MG tablet Take 1 tablet (100 mg total) by mouth 2 (two) times daily. 20 tablet 0  . ezetimibe (ZETIA) 10 MG tablet Take 1 tablet (10 mg total) by mouth daily. 90 tablet 3  . fluticasone (FLONASE) 50 MCG/ACT nasal spray Place 1 spray into both nostrils daily as needed for allergies or rhinitis. 48 g 3  . furosemide (LASIX) 20 MG tablet Take 1 tablet (20 mg total) by mouth every other day. (Patient taking differently: Take 20 mg by mouth every other day. ) 15 tablet 10  . gabapentin (NEURONTIN) 100 MG capsule TAKE 1 CAPSULE BY MOUTH THREE TIMES A DAY 90 capsule 5  . levothyroxine (SYNTHROID) 175 MCG tablet TAKE 1 TABLET (175 MCG TOTAL) BY MOUTH DAILY BEFORE BREAKFAST. 90 tablet 2  . meloxicam (MOBIC) 15 MG tablet Take 1 tablet (15 mg total) by mouth daily. 90 tablet 3  . nitroGLYCERIN (NITROSTAT) 0.4 MG SL tablet Place 1 tablet (0.4 mg total) under the tongue every 5 (five) minutes as needed for chest pain. 25 tablet 3  . omeprazole (PRILOSEC) 20 MG capsule TAKE 1 CAPSULE BY MOUTH TWICE A DAY 180 capsule 1  . [START ON 04/24/2020] oxycodone (ROXICODONE) 30 MG immediate release tablet Take 1 tablet (30 mg total) by mouth every 6 (six) hours as needed for pain. 120 tablet 0  . ropinirole (REQUIP) 5 MG tablet TAKE 1 TABLET BY MOUTH TWICE A DAY 180 tablet 3  . sertraline (ZOLOFT) 50 MG tablet TAKE 1 TABLET BY MOUTH EVERY DAY 90 tablet 3  . temazepam (RESTORIL) 30 MG capsule Take 1 capsule (30 mg total) by mouth at  bedtime as needed for sleep. 30 capsule 2   No current facility-administered medications for this visit.    Allergies:   Brilinta [ticagrelor], Colchicine, Metoprolol tartrate, Mirapex [pramipexole dihydrochloride], Shellfish allergy, Statins, Zolpidem, Coconut oil, Levaquin [levofloxacin], and Trazodone and nefazodone   Social History: Social History   Socioeconomic History  . Marital status: Widowed    Spouse name: Not on file  . Number of children: 1  . Years of education: Not on file  . Highest education level: Not on file  Occupational History  . Occupation: Disabled    Employer: UNEMPLOYED  Tobacco Use  . Smoking status: Former Smoker    Packs/day: 2.00    Years: 50.00    Pack years: 100.00    Types: Cigarettes    Quit date: 07/23/2002    Years since quitting: 17.6  .  Smokeless tobacco: Never Used  Vaping Use  . Vaping Use: Never used  Substance and Sexual Activity  . Alcohol use: No    Alcohol/week: 0.0 standard drinks  . Drug use: No  . Sexual activity: Not on file  Other Topics Concern  . Not on file  Social History Narrative  . Not on file   Social Determinants of Health   Financial Resource Strain:   . Difficulty of Paying Living Expenses: Not on file  Food Insecurity:   . Worried About Charity fundraiser in the Last Year: Not on file  . Ran Out of Food in the Last Year: Not on file  Transportation Needs:   . Lack of Transportation (Medical): Not on file  . Lack of Transportation (Non-Medical): Not on file  Physical Activity:   . Days of Exercise per Week: Not on file  . Minutes of Exercise per Session: Not on file  Stress:   . Feeling of Stress : Not on file  Social Connections:   . Frequency of Communication with Friends and Family: Not on file  . Frequency of Social Gatherings with Friends and Family: Not on file  . Attends Religious Services: Not on file  . Active Member of Clubs or Organizations: Not on file  . Attends Archivist  Meetings: Not on file  . Marital Status: Not on file  Intimate Partner Violence:   . Fear of Current or Ex-Partner: Not on file  . Emotionally Abused: Not on file  . Physically Abused: Not on file  . Sexually Abused: Not on file    Family History: Family History  Problem Relation Age of Onset  . Leukemia Mother   . Heart disease Father   . Stroke Father   . Heart attack Father   . Alcoholism Paternal Uncle   . Alcoholism Maternal Grandfather   . Colon cancer Neg Hx   . Esophageal cancer Neg Hx      Review of Systems: All other systems reviewed and are otherwise negative except as noted above.  Physical Exam: There were no vitals filed for this visit.   GEN- The patient is well appearing, alert and oriented x 3 today.   HEENT: normocephalic, atraumatic; sclera clear, conjunctiva pink; hearing intact; oropharynx clear; neck supple  Lungs- Clear to ausculation bilaterally, normal work of breathing.  No wheezes, rales, rhonchi Heart- Regular rate and rhythm, no murmurs, rubs or gallops  GI- soft, non-tender, non-distended, bowel sounds present  Extremities- no clubbing or cyanosis. No edema MS- no significant deformity or atrophy Skin- warm and dry, no rash or lesion; PPM pocket well healed Psych- euthymic mood, full affect Neuro- strength and sensation are intact  PPM Interrogation- reviewed in detail today,  See PACEART report  EKG:  EKG is not ordered today. The ekg ordered 01/19/2020 shows NSR at 98 bpm with PVCs  Recent Labs: 02/20/2020: ALT 22; BUN 22; Creatinine, Ser 1.20; Hemoglobin 15.0; Platelets 265; Potassium 3.5; Sodium 137   Wt Readings from Last 3 Encounters:  02/20/20 (!) 215 lb (97.5 kg)  02/18/20 (!) 215 lb (97.5 kg)  01/18/20 218 lb 0.6 oz (98.9 kg)     Other studies Reviewed: Additional studies/ records that were reviewed today include: Previous EP office notes, Previous remote checks, Most recent labwork.   Assessment and Plan:  1. SND s/p  Medtronic PPM  Normal PPM function See Pace Art report No changes today  2. Atrial flutter Continue eliquis for CHA2DS2VASC of  at least 6.    3. CAD with prior stenting    Current medicines are reviewed at length with the patient today.   The patient {ACTIONS; HAS/DOES NOT HAVE:19233} concerns regarding his medicines.  The following changes were made today:  {NONE DEFAULTED:18576::"none"}  Labs/ tests ordered today include: *** No orders of the defined types were placed in this encounter.    Disposition:   Follow up with Dr. [name] in 12 Months    Signed, Daichi, Moris  03/10/2020 2:35 PM  [place] [address] [phone] (office) [phone] (fax)

## 2020-03-14 ENCOUNTER — Encounter: Payer: Medicare HMO | Admitting: Student

## 2020-03-18 ENCOUNTER — Other Ambulatory Visit: Payer: Self-pay | Admitting: Family Medicine

## 2020-03-30 ENCOUNTER — Ambulatory Visit (INDEPENDENT_AMBULATORY_CARE_PROVIDER_SITE_OTHER): Payer: Medicare HMO | Admitting: *Deleted

## 2020-03-30 DIAGNOSIS — I495 Sick sinus syndrome: Secondary | ICD-10-CM

## 2020-03-30 LAB — CUP PACEART REMOTE DEVICE CHECK
Battery Remaining Longevity: 138 mo
Battery Voltage: 3 V
Brady Statistic AP VP Percent: 7.14 %
Brady Statistic AP VS Percent: 14.44 %
Brady Statistic AS VP Percent: 13.49 %
Brady Statistic AS VS Percent: 64.93 %
Brady Statistic RA Percent Paced: 21.86 %
Brady Statistic RV Percent Paced: 20.63 %
Date Time Interrogation Session: 20210908010103
Implantable Lead Implant Date: 20180604
Implantable Lead Implant Date: 20180604
Implantable Lead Location: 753859
Implantable Lead Location: 753860
Implantable Lead Model: 5076
Implantable Lead Model: 5076
Implantable Pulse Generator Implant Date: 20180604
Lead Channel Impedance Value: 323 Ohm
Lead Channel Impedance Value: 380 Ohm
Lead Channel Impedance Value: 437 Ohm
Lead Channel Impedance Value: 456 Ohm
Lead Channel Pacing Threshold Amplitude: 0.625 V
Lead Channel Pacing Threshold Amplitude: 0.875 V
Lead Channel Pacing Threshold Pulse Width: 0.4 ms
Lead Channel Pacing Threshold Pulse Width: 0.4 ms
Lead Channel Sensing Intrinsic Amplitude: 2.75 mV
Lead Channel Sensing Intrinsic Amplitude: 2.75 mV
Lead Channel Sensing Intrinsic Amplitude: 4.25 mV
Lead Channel Sensing Intrinsic Amplitude: 4.25 mV
Lead Channel Setting Pacing Amplitude: 1.5 V
Lead Channel Setting Pacing Amplitude: 2.5 V
Lead Channel Setting Pacing Pulse Width: 0.4 ms
Lead Channel Setting Sensing Sensitivity: 1.2 mV

## 2020-03-31 NOTE — Progress Notes (Signed)
Remote pacemaker transmission.   

## 2020-05-09 ENCOUNTER — Other Ambulatory Visit: Payer: Self-pay

## 2020-05-09 ENCOUNTER — Ambulatory Visit (INDEPENDENT_AMBULATORY_CARE_PROVIDER_SITE_OTHER): Payer: Medicare HMO | Admitting: Family Medicine

## 2020-05-09 ENCOUNTER — Encounter: Payer: Self-pay | Admitting: Family Medicine

## 2020-05-09 ENCOUNTER — Other Ambulatory Visit: Payer: Self-pay | Admitting: Family Medicine

## 2020-05-09 VITALS — BP 128/74 | HR 63 | Temp 97.8°F | Ht 70.0 in | Wt 219.4 lb

## 2020-05-09 DIAGNOSIS — G2581 Restless legs syndrome: Secondary | ICD-10-CM

## 2020-05-09 DIAGNOSIS — K219 Gastro-esophageal reflux disease without esophagitis: Secondary | ICD-10-CM

## 2020-05-09 MED ORDER — ROPINIROLE HCL 5 MG PO TABS
5.0000 mg | ORAL_TABLET | Freq: Four times a day (QID) | ORAL | 3 refills | Status: DC
Start: 2020-05-09 — End: 2021-03-07

## 2020-05-09 MED ORDER — OMEPRAZOLE 40 MG PO CPDR: 40.0000 mg | DELAYED_RELEASE_CAPSULE | Freq: Every day | ORAL | 3 refills | Status: DC

## 2020-05-09 NOTE — Progress Notes (Signed)
   Subjective:    Patient ID: [name], male    DOB: 11-17-1940, 79 y.o.   MRN: [id number]  HPI Here for several issues. First he has had some trouble with waking up at night with a hot sour taste in the back of his throat, and one night this past weekend he woke up choking. He actually had a ST and his voice was hoarse for 2 days after that. No fever or cough. He has been taking Omeprazole OTC at bedtime. Also his restless legs have gotten worse. He increased his Requip on his own 2 weeks ago from BID to TID, and he asks if he can increase the dose again. The medication works well for a few hours and then seems to wear off.    Review of Systems  Constitutional: Negative.   HENT: Positive for sore throat, trouble swallowing and voice change.   Eyes: Negative.   Respiratory: Negative.   Cardiovascular: Negative.   Endocrine: Negative for polyuria.       Objective:   Physical Exam Constitutional:      Appearance: Normal appearance. He is not ill-appearing.  HENT:     Mouth/Throat:     Pharynx: Oropharynx is clear.  Cardiovascular:     Rate and Rhythm: Normal rate and regular rhythm.     Pulses: Normal pulses.     Heart sounds: Normal heart sounds.  Pulmonary:     Breath sounds: Normal breath sounds.  Musculoskeletal:     Cervical back: Normal range of motion.  Lymphadenopathy:     Cervical: No cervical adenopathy.  Neurological:     General: No focal deficit present.     Mental Status: He is alert and oriented to person, place, and time.     Motor: No weakness.     Coordination: Coordination normal.           Assessment & Plan:  For nocturnal GERD, we will increase Omeprazole to 40 mg at bedtime. For restless legs, we will increase Requip to 5 mg QID. [name], MD

## 2020-05-24 ENCOUNTER — Telehealth (INDEPENDENT_AMBULATORY_CARE_PROVIDER_SITE_OTHER): Payer: Medicare HMO | Admitting: Family Medicine

## 2020-05-24 ENCOUNTER — Encounter: Payer: Self-pay | Admitting: Family Medicine

## 2020-05-24 VITALS — Ht 70.0 in | Wt 215.0 lb

## 2020-05-24 DIAGNOSIS — M5440 Lumbago with sciatica, unspecified side: Secondary | ICD-10-CM | POA: Diagnosis not present

## 2020-05-24 DIAGNOSIS — F1190 Opioid use, unspecified, uncomplicated: Secondary | ICD-10-CM | POA: Diagnosis not present

## 2020-05-24 MED ORDER — NITROGLYCERIN 0.4 MG SL SUBL: 0.4000 mg | SUBLINGUAL_TABLET | SUBLINGUAL | 3 refills | Status: DC | PRN

## 2020-05-24 MED ORDER — OXYCODONE HCL 30 MG PO TABS: 30.0000 mg | ORAL_TABLET | Freq: Four times a day (QID) | ORAL | 0 refills | Status: DC | PRN

## 2020-05-24 MED ORDER — OXYCODONE HCL 30 MG PO TABS
30.0000 mg | ORAL_TABLET | Freq: Four times a day (QID) | ORAL | 0 refills | Status: DC | PRN
Start: 2020-05-24 — End: 2020-05-24

## 2020-05-24 NOTE — Progress Notes (Signed)
° °  Subjective:    Patient ID: [name], male    DOB: [birth year], 79 y.o.   MRN: [id number]  HPI Virtual Visit via Telephone Note  I connected with the patient on 05/24/20 at  8:45 AM EDT by telephone and verified that I am speaking with the correct person using two identifiers.   I discussed the limitations, risks, security and privacy concerns of performing an evaluation and management service by telephone and the availability of in person appointments. I also discussed with the patient that there may be a patient responsible charge related to this service. The patient expressed understanding and agreed to proceed.  Location patient: home Location provider: work or home office Participants present for the call: patient, provider Patient did not have a visit in the prior 7 days to address this/these issue(s).   History of Present Illness: Here for pain management, he is doing well.  Indication for chronic opioid: low back pain Medication and dose: Oxycodone 30 mg # pills per month: 120 Last UDS date: 08-26-18 Opioid Treatment Agreement signed (Y/N): 08-26-17 Opioid Treatment Agreement last reviewed with patient:  05-24-20 [facility] reviewed this encounter (include red flags): Yes    Observations/Objective: Patient sounds cheerful and well on the phone. I do not appreciate any SOB. Speech and thought processing are grossly intact. Patient reported vitals:  Assessment and Plan: Pain management, meds were refilled.  [name], MD   Follow Up Instructions:     [phone] 5-10 [phone] 11-20 [phone] 21-30 I did not refer this patient for an OV in the next 24 hours for this/these issue(s).  I discussed the assessment and treatment plan with the patient. The patient was provided an opportunity to ask questions and all were answered. The patient agreed with the plan and demonstrated an understanding of the instructions.   The patient was advised to call back or seek an in-person  evaluation if the symptoms worsen or if the condition fails to improve as anticipated.  I provided 10 minutes of non-face-to-face time during this encounter.   [name], MD    Review of Systems     Objective:   Physical Exam        Assessment & Plan:

## 2020-05-27 DIAGNOSIS — Z23 Encounter for immunization: Secondary | ICD-10-CM | POA: Diagnosis not present

## 2020-06-20 DIAGNOSIS — N5201 Erectile dysfunction due to arterial insufficiency: Secondary | ICD-10-CM | POA: Diagnosis not present

## 2020-06-20 DIAGNOSIS — K4031 Unilateral inguinal hernia, with obstruction, without gangrene, recurrent: Secondary | ICD-10-CM | POA: Diagnosis not present

## 2020-06-20 DIAGNOSIS — N4341 Spermatocele of epididymis, single: Secondary | ICD-10-CM | POA: Diagnosis not present

## 2020-06-29 ENCOUNTER — Ambulatory Visit (INDEPENDENT_AMBULATORY_CARE_PROVIDER_SITE_OTHER): Payer: Medicare HMO

## 2020-06-29 DIAGNOSIS — I495 Sick sinus syndrome: Secondary | ICD-10-CM

## 2020-06-29 LAB — CUP PACEART REMOTE DEVICE CHECK
Battery Remaining Longevity: 133 mo
Battery Voltage: 3 V
Brady Statistic AP VP Percent: 7.9 %
Brady Statistic AP VS Percent: 13.81 %
Brady Statistic AS VP Percent: 18 %
Brady Statistic AS VS Percent: 60.3 %
Brady Statistic RA Percent Paced: 21.83 %
Brady Statistic RV Percent Paced: 25.9 %
Date Time Interrogation Session: 20211207235512
Implantable Lead Implant Date: 20180604
Implantable Lead Implant Date: 20180604
Implantable Lead Location: 753859
Implantable Lead Location: 753860
Implantable Lead Model: 5076
Implantable Lead Model: 5076
Implantable Pulse Generator Implant Date: 20180604
Lead Channel Impedance Value: 285 Ohm
Lead Channel Impedance Value: 342 Ohm
Lead Channel Impedance Value: 399 Ohm
Lead Channel Impedance Value: 418 Ohm
Lead Channel Pacing Threshold Amplitude: 0.75 V
Lead Channel Pacing Threshold Amplitude: 0.875 V
Lead Channel Pacing Threshold Pulse Width: 0.4 ms
Lead Channel Pacing Threshold Pulse Width: 0.4 ms
Lead Channel Sensing Intrinsic Amplitude: 1.625 mV
Lead Channel Sensing Intrinsic Amplitude: 1.625 mV
Lead Channel Sensing Intrinsic Amplitude: 4.375 mV
Lead Channel Sensing Intrinsic Amplitude: 4.375 mV
Lead Channel Setting Pacing Amplitude: 1.5 V
Lead Channel Setting Pacing Amplitude: 2.5 V
Lead Channel Setting Pacing Pulse Width: 0.4 ms
Lead Channel Setting Sensing Sensitivity: 1.2 mV

## 2020-07-11 NOTE — Progress Notes (Signed)
Remote pacemaker transmission.   

## 2020-07-25 ENCOUNTER — Telehealth: Payer: Self-pay | Admitting: Family Medicine

## 2020-07-25 NOTE — Telephone Encounter (Signed)
error 

## 2020-07-29 ENCOUNTER — Ambulatory Visit: Payer: Medicare HMO

## 2020-08-03 ENCOUNTER — Telehealth: Payer: Self-pay | Admitting: Family Medicine

## 2020-08-03 NOTE — Telephone Encounter (Signed)
Pt is calling in to check the status of the Rx oxycodone (ROXICODONE) 30 MG for a PA and so that he is able to get his $104.00 back from the insurance company pt stated that he only has 14 days to get it back.  Pt would like to have a call back to let him know if it has been done.

## 2020-08-04 NOTE — Telephone Encounter (Signed)
Patient aware.  prior authorization submitted via covermymeds.  Waiting response. (Key: [id number])  [employer] is reviewing your PA request. Typically an electronic response will be received within 72 hours. To check for an update later, open this request from your dashboard.  You may close this dialog and return to your dashboard to perform other tasks.  (Key: [id number])  This request has received a Favorable outcome.  Please note any additional information provided by [employer] at the bottom of your screen.

## 2020-08-10 ENCOUNTER — Ambulatory Visit: Payer: Medicare Other

## 2020-08-10 NOTE — Progress Notes (Deleted)
Subjective:   [name] is a 80 y.o. male who presents for an Initial Medicare Annual Wellness Visit.  Review of Systems    N/A        Objective:    There were no vitals filed for this visit. There is no height or weight on file to calculate BMI.  Advanced Directives 02/20/2020 01/18/2020 04/17/2018 04/17/2018 02/27/2018 10/29/2017 10/09/2017  Does Patient Have a Medical Advance Directive? Yes No No No Yes No No  Type of Paramedic of [place];Living will - - - Press photographer - -  Does patient want to make changes to medical advance directive? - - - - - - -  Copy of Press photographer in Chart? - - - - - - -  Would patient like information on creating a medical advance directive? - - Yes (MAU/Ambulatory/Procedural Areas - Information given) No - Patient declined - - -  Pre-existing out of facility DNR order (yellow form or pink MOST form) - - - - - - -    Current Medications (verified) Outpatient Encounter Medications as of 08/10/2020  Medication Sig  . apixaban (ELIQUIS) 5 MG TABS tablet Take 1 tablet (5 mg total) by mouth 2 (two) times daily.  . Ascorbic Acid (VITAMIN C PO) Take 4 tablets by mouth daily as needed (flu like symptopms).   [name] aspirin EC 81 MG tablet Take 81 mg by mouth daily. (Patient not taking: Reported on 05/24/2020)  . clonazePAM (KLONOPIN) 1 MG tablet TAKE 1 TABLET BY MOUTH 3 TIMES DAILY AS NEEDED FOR ANXIETY.  [name] colchicine 0.6 MG tablet Take 1-2 tablets every 6 hours as needed.  . diclofenac sodium (VOLTAREN) 1 % GEL Apply 1 application topically daily as needed (ankle pain).  [name] diltiazem (CARDIZEM CD) 120 MG 24 hr capsule Take 1 capsule (120 mg total) by mouth daily.  [name] doxycycline (VIBRA-TABS) 100 MG tablet Take 1 tablet (100 mg total) by mouth 2 (two) times daily.  [name] ezetimibe (ZETIA) 10 MG tablet Take 1 tablet (10 mg total) by mouth daily.  . fluticasone (FLONASE) 50 MCG/ACT nasal spray Place 1 spray into both  nostrils daily as needed for allergies or rhinitis.  . furosemide (LASIX) 20 MG tablet Take 1 tablet (20 mg total) by mouth every other day. (Patient not taking: Reported on 05/24/2020)  . gabapentin (NEURONTIN) 100 MG capsule TAKE 1 CAPSULE BY MOUTH THREE TIMES A DAY  . levothyroxine (SYNTHROID) 175 MCG tablet TAKE 1 TABLET BY MOUTH EVERY DAY BEFORE BREAKFAST  . meloxicam (MOBIC) 15 MG tablet Take 1 tablet (15 mg total) by mouth daily.  . nitroGLYCERIN (NITROSTAT) 0.4 MG SL tablet Place 1 tablet (0.4 mg total) under the tongue every 5 (five) minutes as needed for chest pain.  [name] omeprazole (PRILOSEC) 40 MG capsule Take 1 capsule (40 mg total) by mouth daily.  [name] oxycodone (ROXICODONE) 30 MG immediate release tablet Take 1 tablet (30 mg total) by mouth every 6 (six) hours as needed for pain.  . ropinirole (REQUIP) 5 MG tablet Take 1 tablet (5 mg total) by mouth in the morning, at noon, in the evening, and at bedtime.  . sertraline (ZOLOFT) 50 MG tablet TAKE 1 TABLET EVERY DAY  . temazepam (RESTORIL) 30 MG capsule Take 1 capsule (30 mg total) by mouth at bedtime as needed for sleep.   No facility-administered encounter medications on file as of 08/10/2020.    Allergies (verified) Brilinta [ticagrelor], Colchicine, Metoprolol  tartrate, Mirapex [pramipexole dihydrochloride], Shellfish allergy, Statins, Zolpidem, Coconut oil, Levaquin [levofloxacin], and Trazodone and nefazodone   History: Past Medical History:  Diagnosis Date  . Adenomatous polyp of colon 2007  . Arthritis   . Atrial flutter ([place])   . AVM (arteriovenous malformation) 2011   a. S/p argon plasma coagulation and ablation in 2011, 2017.  . Bradycardia    a. H/o almost 7sec pause nocturnally during 2011 admission. Also has h/o fatigue with BB.  [name] CAD (coronary artery disease)    a. Nonobst disease by cath 2009. b. s/p PCI 10/2014 with DES to [place], patent by relook 11/2014 (Brilinta changed to Plavix with improved sx).  .  Chronic diastolic CHF (congestive heart failure) ([place])   . Depression   . Diverticulosis   . Gastritis 2011  . GERD (gastroesophageal reflux disease)   . History of hiatal hernia   . Hyperlipidemia   . Hypertension   . Hypothyroidism   . Myocardial infarction ([place])   . Obstructive sleep apnea    -no cpap use  . Premature atrial contractions Holter 2016  . PVC's (premature ventricular contractions) Holter 2016  . Statin intolerance   . Transfusion history    several years ago -GI bleed   Past Surgical History:  Procedure Laterality Date  . ANKLE SURGERY Left   . APPENDECTOMY    . CARDIAC CATHETERIZATION N/A 11/26/2014   Procedure: Left Heart Cath and Coronary Angiography;  Surgeon: Burnell Blanks, MD;  Location: [facility];  Service: Cardiovascular;  Laterality: N/A;  . CHOLECYSTECTOMY N/A 08/23/2016   Procedure: LAPAROSCOPIC CHOLECYSTECTOMY;  Surgeon: Coralie Keens, MD;  Location: [facility];  Service: General;  Laterality: N/A;  . COLONOSCOPY  08-23-05   per Dr. Deatra Ina, adenomatous polyps, repeat in 5 yrs   . COLONOSCOPY WITH PROPOFOL N/A 12/06/2015   Procedure: COLONOSCOPY WITH PROPOFOL;  Surgeon: Doran Stabler, MD;  Location: [facility];  Service: Gastroenterology;  Laterality: N/A;  . ENTEROSCOPY N/A 12/06/2015   Procedure: ENTEROSCOPY;  Surgeon: Doran Stabler, MD;  Location: [facility];  Service: Gastroenterology;  Laterality: N/A;  . ESOPHAGOGASTRODUODENOSCOPY  08-23-05   per Dr. Deatra Ina, cauterized jejunal AVMs   . HERNIA REPAIR    . HOT HEMOSTASIS N/A 12/06/2015   Procedure: HOT HEMOSTASIS (ARGON PLASMA COAGULATION/BICAP);  Surgeon: Doran Stabler, MD;  Location: [facility];  Service: Gastroenterology;  Laterality: N/A;  . LEFT HEART CATHETERIZATION WITH CORONARY ANGIOGRAM N/A 09/08/2012   Procedure: LEFT HEART CATHETERIZATION WITH CORONARY ANGIOGRAM;  Surgeon: Burnell Blanks, MD;  Location: [facility];  Service: Cardiovascular;  Laterality:  N/A;  . LEFT HEART CATHETERIZATION WITH CORONARY ANGIOGRAM N/A 11/16/2014   Procedure: LEFT HEART CATHETERIZATION WITH CORONARY ANGIOGRAM;  Surgeon: Leonie Man, MD;  Location: [facility] LAB;  Service: Cardiovascular;  Laterality: N/A;  . PACEMAKER IMPLANT N/A 12/24/2016   Procedure: Pacemaker Implant;  Surgeon: Deboraha Sprang, MD;  Location: [facility];  Service: Cardiovascular;  Laterality: N/A;  . SKIN GRAFT Right    leg  . TONSILLECTOMY     Family History  Problem Relation Age of Onset  . Leukemia Mother   . Heart disease Father   . Stroke Father   . Heart attack Father   . Alcoholism Paternal Uncle   . Alcoholism Maternal Grandfather   . Colon cancer Neg Hx   . Esophageal cancer Neg Hx    Social History   Socioeconomic History  .  Marital status: Widowed    Spouse name: Not on file  . Number of children: 1  . Years of education: Not on file  . Highest education level: Not on file  Occupational History  . Occupation: Disabled    Employer: UNEMPLOYED  Tobacco Use  . Smoking status: Former Smoker    Packs/day: 2.00    Years: 50.00    Pack years: 100.00    Types: Cigarettes    Quit date: 07/23/2002    Years since quitting: 18.0  . Smokeless tobacco: Never Used  Vaping Use  . Vaping Use: Never used  Substance and Sexual Activity  . Alcohol use: No    Alcohol/week: 0.0 standard drinks  . Drug use: No  . Sexual activity: Not on file  Other Topics Concern  . Not on file  Social History Narrative  . Not on file   Social Determinants of Health   Financial Resource Strain: Not on file  Food Insecurity: Not on file  Transportation Needs: Not on file  Physical Activity: Not on file  Stress: Not on file  Social Connections: Not on file    Tobacco Counseling Counseling given: Not Answered   Clinical Intake:                 Diabetic?No          Activities of Daily Living No flowsheet data found.  Patient Care Team: [name], MD  as PCP - General [name] Annita Brod, MD as PCP - Cardiology (Cardiology) Deboraha Sprang, MD as PCP - Electrophysiology (Cardiology)  Indicate any recent Medical Services you may have received from other than [facility] providers in the past year (date may be approximate).     Assessment:   This is a routine wellness examination for [name].  Hearing/Vision screen No exam data present  Dietary issues and exercise activities discussed:    Goals   None    Depression Screen PHQ 2/9 Scores 05/24/2020 05/09/2020 05/09/2020 04/17/2018 04/17/2018  PHQ - 2 Score 3 3 0 4 4  PHQ- 9 Score 16 13 - 14 14  Exception Documentation - Medical reason Medical reason - -    Fall Risk Fall Risk  05/24/2020 05/09/2020 04/17/2018 11/14/2017 09/02/2017  Falls in the past year? 1 1 No Yes Yes  Number falls in past yr: 1 1 - 2 or more 2 or more  Injury with Fall? 1 0 - - No  Risk Factor Category  - - - High Fall Risk -  Risk for fall due to : - - Impaired balance/gait;Impaired mobility - -  Follow up Falls evaluation completed - - - -    FALL RISK PREVENTION PERTAINING TO THE HOME:  Any stairs in or around the home? {YES/NO:21197} If so, are there any without handrails? No  Home free of loose throw rugs in walkways, pet beds, electrical cords, etc? Yes  Adequate lighting in your home to reduce risk of falls? Yes   ASSISTIVE DEVICES UTILIZED TO PREVENT FALLS:  Life alert? {YES/NO:21197} Use of a cane, walker or w/c? {YES/NO:21197} Grab bars in the bathroom? {YES/NO:21197} Shower chair or bench in shower? {YES/NO:21197} Elevated toilet seat or a handicapped toilet? {YES/NO:21197}  TIMED UP AND GO:  Was the test performed? {YES/NO:21197}.  Length of time to ambulate 10 feet: *** sec.   {Appearance of GGYI:9485462}  Cognitive Function: MMSE - Mini Mental State Exam 11/14/2017  Orientation to time 5  Orientation to Place 5  Registration 3  Attention/ Calculation 5  Recall 3  Language- name 2  objects 2  Language- repeat 1  Language- follow 3 step command 3  Language- read & follow direction 1  Write a sentence 1  Copy design 1  Total score 30        Immunizations Immunization History  Administered Date(s) Administered  . Influenza Split 07/31/2011, 07/23/2012  . Influenza Whole 04/25/2010  . Influenza, High Dose Seasonal PF 03/19/2016, 04/09/2017, 05/22/2018  . Pneumococcal Polysaccharide-23 11/17/2014  . Td 03/09/2016    TDAP status: Up to date  Flu Vaccine status: Up to date  {Pneumococcal vaccine status:2101807}  {Covid-19 vaccine status:2101808}  Qualifies for Shingles Vaccine? Yes   Zostavax completed No   Shingrix Completed?: No.    Education has been provided regarding the importance of this vaccine. Patient has been advised to call insurance company to determine out of pocket expense if they have not yet received this vaccine. Advised may also receive vaccine at local pharmacy or Health Dept. Verbalized acceptance and understanding.  Screening Tests Health Maintenance  Topic Date Due  . Hepatitis C Screening  Never done  . COVID-19 Vaccine (1) Never done  . URINE MICROALBUMIN  Never done  . PNA vac Low Risk Adult (2 of 2 - PCV13) 11/17/2015  . TETANUS/TDAP  03/09/2026  . INFLUENZA VACCINE  Completed    Health Maintenance  Health Maintenance Due  Topic Date Due  . Hepatitis C Screening  Never done  . COVID-19 Vaccine (1) Never done  . URINE MICROALBUMIN  Never done  . PNA vac Low Risk Adult (2 of 2 - PCV13) 11/17/2015    Colorectal cancer screening: Referral to GI placed 08/10/2020. Pt aware the office will call re: appt.  Lung Cancer Screening: (Low Dose CT Chest recommended if Age 59-80 years, 30 pack-year currently smoking OR have quit w/in 15years.) does not qualify.   Lung Cancer Screening Referral: N/A   Additional Screening:  Hepatitis C Screening: does qualify;   Vision Screening: Recommended annual ophthalmology exams for  early detection of glaucoma and other disorders of the eye. Is the patient up to date with their annual eye exam?  {YES/NO:21197} Who is the provider or what is the name of the office in which the patient attends annual eye exams? *** If pt is not established with a provider, would they like to be referred to a provider to establish care? {YES/NO:21197}.   Dental Screening: Recommended annual dental exams for proper oral hygiene  Community Resource Referral / Chronic Care Management: CRR required this visit?  No   CCM required this visit?  No      Plan:     I have personally reviewed and noted the following in the patient's chart:   . Medical and social history . Use of alcohol, tobacco or illicit drugs  . Current medications and supplements . Functional ability and status . Nutritional status . Physical activity . Advanced directives . List of other physicians . Hospitalizations, surgeries, and ER visits in previous 12 months . Vitals . Screenings to include cognitive, depression, and falls . Referrals and appointments  In addition, I have reviewed and discussed with patient certain preventive protocols, quality metrics, and best practice recommendations. A written personalized care plan for preventive services as well as general preventive health recommendations were provided to patient.     Ofilia Neas, LPN   [id number]   Nurse Notes: None

## 2020-08-18 ENCOUNTER — Telehealth (INDEPENDENT_AMBULATORY_CARE_PROVIDER_SITE_OTHER): Payer: Medicare Other | Admitting: Family Medicine

## 2020-08-18 ENCOUNTER — Encounter: Payer: Self-pay | Admitting: Family Medicine

## 2020-08-18 DIAGNOSIS — M5440 Lumbago with sciatica, unspecified side: Secondary | ICD-10-CM | POA: Diagnosis not present

## 2020-08-18 DIAGNOSIS — F1190 Opioid use, unspecified, uncomplicated: Secondary | ICD-10-CM | POA: Diagnosis not present

## 2020-08-18 MED ORDER — OXYCODONE HCL 30 MG PO TABS: 30.0000 mg | ORAL_TABLET | Freq: Four times a day (QID) | ORAL | 0 refills | Status: DC | PRN

## 2020-08-18 NOTE — Progress Notes (Signed)
   Subjective:    Patient ID: [name], male    DOB: August 01, 1940, 80 y.o.   MRN: [id number]  HPI Here pain management, he is doing well. He is excited to tell me about his new girlfriend. They have been dating for 2 weeks, and he is very happy.  Indication for chronic opioid: low back pain Medication and dose: Oxycodone 30 mg # pills per month: 120 Last UDS date: 08-26-18 Opioid Treatment Agreement signed (Y/N): 08-26-17 Opioid Treatment Agreement last reviewed with patient:  08-18-20 [facility] reviewed this encounter (include red flags): Yes Virtual Visit via Telephone Note  I connected with the patient on 08/18/20 at 10:30 AM EST by telephone and verified that I am speaking with the correct person using two identifiers.   I discussed the limitations, risks, security and privacy concerns of performing an evaluation and management service by telephone and the availability of in person appointments. I also discussed with the patient that there may be a patient responsible charge related to this service. The patient expressed understanding and agreed to proceed.  Location patient: home Location provider: work or home office Participants present for the call: patient, provider Patient did not have a visit in the prior 7 days to address this/these issue(s).   History of Present Illness:    Observations/Objective: Patient sounds cheerful and well on the phone. I do not appreciate any SOB. Speech and thought processing are grossly intact. Patient reported vitals:  Assessment and Plan:   Follow Up Instructions:     [id number] [id number] [id number] [id number] I did not refer this patient for an OV in the next 24 hours for this/these issue(s).  I discussed the assessment and treatment plan with the patient. The patient was provided an opportunity to ask questions and all were answered. The patient agreed with the plan and demonstrated an understanding of the instructions.   The patient  was advised to call back or seek an in-person evaluation if the symptoms worsen or if the condition fails to improve as anticipated.  I provided 13 minutes of non-face-to-face time during this encounter.   [name], MD    Review of Systems     Objective:   Physical Exam        Assessment & Plan:  Pain management. He is past due for a UDS, so I told him we will only refill the Oxycodone for one month. He says he will come in next week for the UDS, so we will withhold any further refills until this is done.  [name], MD

## 2020-08-23 ENCOUNTER — Other Ambulatory Visit: Payer: Medicare Other

## 2020-08-23 ENCOUNTER — Ambulatory Visit (INDEPENDENT_AMBULATORY_CARE_PROVIDER_SITE_OTHER): Payer: Medicare Other

## 2020-08-23 ENCOUNTER — Other Ambulatory Visit: Payer: Self-pay

## 2020-08-23 DIAGNOSIS — F1190 Opioid use, unspecified, uncomplicated: Secondary | ICD-10-CM

## 2020-08-23 DIAGNOSIS — Z1211 Encounter for screening for malignant neoplasm of colon: Secondary | ICD-10-CM | POA: Diagnosis not present

## 2020-08-23 DIAGNOSIS — I872 Venous insufficiency (chronic) (peripheral): Secondary | ICD-10-CM

## 2020-08-23 DIAGNOSIS — Z7901 Long term (current) use of anticoagulants: Secondary | ICD-10-CM | POA: Diagnosis not present

## 2020-08-23 DIAGNOSIS — Z0000 Encounter for general adult medical examination without abnormal findings: Secondary | ICD-10-CM

## 2020-08-23 DIAGNOSIS — Z950 Presence of cardiac pacemaker: Secondary | ICD-10-CM

## 2020-08-23 DIAGNOSIS — I4892 Unspecified atrial flutter: Secondary | ICD-10-CM | POA: Diagnosis not present

## 2020-08-23 NOTE — Progress Notes (Signed)
Subjective:   [name] is a 80 y.o. male who presents for an Initial Medicare Annual Wellness Visit.  I connected with Kimberlee Nearing  today by telephone and verified that I am speaking with the correct person using two identifiers. Location patient: home Location provider: work Persons participating in the virtual visit: patient, provider.   I discussed the limitations, risks, security and privacy concerns of performing an evaluation and management service by telephone and the availability of in person appointments. I also discussed with the patient that there may be a patient responsible charge related to this service. The patient expressed understanding and verbally consented to this telephonic visit.    Interactive audio and video telecommunications were attempted between this provider and patient, however failed, due to patient having technical difficulties OR patient did not have access to video capability.  We continued and completed visit with audio only.      Review of Systems    N/A  Cardiac Risk Factors include: advanced age (>44men, >50 women);dyslipidemia;hypertension;male gender     Objective:    Today's Vitals   08/23/20 1117  PainSc: 2    There is no height or weight on file to calculate BMI.  Advanced Directives 08/23/2020 02/20/2020 01/18/2020 04/17/2018 04/17/2018 02/27/2018 10/29/2017  Does Patient Have a Medical Advance Directive? Yes Yes No No No Yes No  Type of Paramedic of [place];Living will [place];Living will - - - [place] -  Does patient want to make changes to medical advance directive? No - Patient declined - - - - - -  Copy of [place] in Chart? No - copy requested - - - - - -  Would patient like information on creating a medical advance directive? - - - Yes (MAU/Ambulatory/Procedural Areas - Information given) No - Patient declined - -  Pre-existing out of  facility DNR order (yellow form or pink MOST form) - - - - - - -    Current Medications (verified) Outpatient Encounter Medications as of 08/23/2020  Medication Sig  . apixaban (ELIQUIS) 5 MG TABS tablet Take 1 tablet (5 mg total) by mouth 2 (two) times daily.  . Ascorbic Acid (VITAMIN C PO) Take 4 tablets by mouth daily as needed (flu like symptopms).   . clonazePAM (KLONOPIN) 1 MG tablet TAKE 1 TABLET BY MOUTH 3 TIMES DAILY AS NEEDED FOR ANXIETY.  . diclofenac sodium (VOLTAREN) 1 % GEL Apply 1 application topically daily as needed (ankle pain).  [name] levothyroxine (SYNTHROID) 175 MCG tablet TAKE 1 TABLET BY MOUTH EVERY DAY BEFORE BREAKFAST  . oxycodone (ROXICODONE) 30 MG immediate release tablet Take 1 tablet (30 mg total) by mouth every 6 (six) hours as needed for pain.  . ropinirole (REQUIP) 5 MG tablet Take 1 tablet (5 mg total) by mouth in the morning, at noon, in the evening, and at bedtime.  . colchicine 0.6 MG tablet Take 1-2 tablets every 6 hours as needed. (Patient not taking: Reported on 08/23/2020)  . diltiazem (CARDIZEM CD) 120 MG 24 hr capsule Take 1 capsule (120 mg total) by mouth daily. (Patient not taking: Reported on 08/23/2020)  . ezetimibe (ZETIA) 10 MG tablet Take 1 tablet (10 mg total) by mouth daily. (Patient not taking: Reported on 08/23/2020)  . fluticasone (FLONASE) 50 MCG/ACT nasal spray Place 1 spray into both nostrils daily as needed for allergies or rhinitis. (Patient not taking: Reported on 08/23/2020)  . furosemide (LASIX) 20  MG tablet Take 1 tablet (20 mg total) by mouth every other day. (Patient not taking: No sig reported)  . gabapentin (NEURONTIN) 100 MG capsule TAKE 1 CAPSULE BY MOUTH THREE TIMES A DAY (Patient not taking: Reported on 08/23/2020)  . meloxicam (MOBIC) 15 MG tablet Take 1 tablet (15 mg total) by mouth daily. (Patient not taking: Reported on 08/23/2020)  . nitroGLYCERIN (NITROSTAT) 0.4 MG SL tablet Place 1 tablet (0.4 mg total) under the tongue every 5 (five)  minutes as needed for chest pain. (Patient not taking: Reported on 08/23/2020)  . omeprazole (PRILOSEC) 40 MG capsule Take 1 capsule (40 mg total) by mouth daily. (Patient not taking: Reported on 08/23/2020)  . sertraline (ZOLOFT) 50 MG tablet TAKE 1 TABLET EVERY DAY (Patient not taking: Reported on 08/23/2020)  . temazepam (RESTORIL) 30 MG capsule Take 1 capsule (30 mg total) by mouth at bedtime as needed for sleep. (Patient not taking: Reported on 08/23/2020)  . [DISCONTINUED] aspirin EC 81 MG tablet Take 81 mg by mouth daily. (Patient not taking: Reported on 05/24/2020)  . [DISCONTINUED] doxycycline (VIBRA-TABS) 100 MG tablet Take 1 tablet (100 mg total) by mouth 2 (two) times daily.   No facility-administered encounter medications on file as of 08/23/2020.    Allergies (verified) Brilinta [ticagrelor], Colchicine, Metoprolol tartrate, Mirapex [pramipexole dihydrochloride], Shellfish allergy, Statins, Zolpidem, Coconut oil, Levaquin [levofloxacin], and Trazodone and nefazodone   History: Past Medical History:  Diagnosis Date  . Adenomatous polyp of colon 2007  . Arthritis   . Atrial flutter ([place])   . AVM (arteriovenous malformation) 2011   a. S/p argon plasma coagulation and ablation in 2011, 2017.  . Bradycardia    a. H/o almost 7sec pause nocturnally during 2011 admission. Also has h/o fatigue with BB.  [name] CAD (coronary artery disease)    a. Nonobst disease by cath 2009. b. s/p PCI 10/2014 with DES to [place], patent by relook 11/2014 (Brilinta changed to Plavix with improved sx).  . Chronic diastolic CHF (congestive heart failure) ([place])   . Depression   . Diverticulosis   . Gastritis 2011  . GERD (gastroesophageal reflux disease)   . History of hiatal hernia   . Hyperlipidemia   . Hypertension   . Hypothyroidism   . Myocardial infarction ([place])   . Obstructive sleep apnea    -no cpap use  . Premature atrial contractions Holter 2016  . PVC's (premature ventricular contractions)  Holter 2016  . Statin intolerance   . Transfusion history    several years ago -GI bleed   Past Surgical History:  Procedure Laterality Date  . ANKLE SURGERY Left   . APPENDECTOMY    . CARDIAC CATHETERIZATION N/A 11/26/2014   Procedure: Left Heart Cath and Coronary Angiography;  Surgeon: Burnell Blanks, MD;  Location: [facility];  Service: Cardiovascular;  Laterality: N/A;  . CHOLECYSTECTOMY N/A 08/23/2016   Procedure: LAPAROSCOPIC CHOLECYSTECTOMY;  Surgeon: Coralie Keens, MD;  Location: [facility];  Service: General;  Laterality: N/A;  . COLONOSCOPY  08-23-05   per Dr. Deatra Ina, adenomatous polyps, repeat in 5 yrs   . COLONOSCOPY WITH PROPOFOL N/A 12/06/2015   Procedure: COLONOSCOPY WITH PROPOFOL;  Surgeon: Doran Stabler, MD;  Location: [facility];  Service: Gastroenterology;  Laterality: N/A;  . ENTEROSCOPY N/A 12/06/2015   Procedure: ENTEROSCOPY;  Surgeon: Doran Stabler, MD;  Location: [facility];  Service: Gastroenterology;  Laterality: N/A;  . ESOPHAGOGASTRODUODENOSCOPY  08-23-05   per Dr. Deatra Ina, cauterized  jejunal AVMs   . HERNIA REPAIR    . HOT HEMOSTASIS N/A 12/06/2015   Procedure: HOT HEMOSTASIS (ARGON PLASMA COAGULATION/BICAP);  Surgeon: Doran Stabler, MD;  Location: [facility];  Service: Gastroenterology;  Laterality: N/A;  . LEFT HEART CATHETERIZATION WITH CORONARY ANGIOGRAM N/A 09/08/2012   Procedure: LEFT HEART CATHETERIZATION WITH CORONARY ANGIOGRAM;  Surgeon: Burnell Blanks, MD;  Location: [facility];  Service: Cardiovascular;  Laterality: N/A;  . LEFT HEART CATHETERIZATION WITH CORONARY ANGIOGRAM N/A 11/16/2014   Procedure: LEFT HEART CATHETERIZATION WITH CORONARY ANGIOGRAM;  Surgeon: Leonie Man, MD;  Location: [facility];  Service: Cardiovascular;  Laterality: N/A;  . PACEMAKER IMPLANT N/A 12/24/2016   Procedure: Pacemaker Implant;  Surgeon: Deboraha Sprang, MD;  Location: [facility];  Service: Cardiovascular;  Laterality: N/A;  .  SKIN GRAFT Right    leg  . TONSILLECTOMY     Family History  Problem Relation Age of Onset  . Leukemia Mother   . Heart disease Father   . Stroke Father   . Heart attack Father   . Alcoholism Paternal Uncle   . Alcoholism Maternal Grandfather   . Colon cancer Neg Hx   . Esophageal cancer Neg Hx    Social History   Socioeconomic History  . Marital status: Widowed    Spouse name: Not on file  . Number of children: 1  . Years of education: Not on file  . Highest education level: Not on file  Occupational History  . Occupation: Disabled    Employer: UNEMPLOYED  Tobacco Use  . Smoking status: Former Smoker    Packs/day: 2.00    Years: 50.00    Pack years: 100.00    Types: Cigarettes    Quit date: 07/23/2002    Years since quitting: 18.0  . Smokeless tobacco: Never Used  Vaping Use  . Vaping Use: Never used  Substance and Sexual Activity  . Alcohol use: No    Alcohol/week: 0.0 standard drinks  . Drug use: No  . Sexual activity: Not on file  Other Topics Concern  . Not on file  Social History Narrative  . Not on file   Social Determinants of Health   Financial Resource Strain: Medium Risk  . Difficulty of Paying Living Expenses: Somewhat hard  Food Insecurity: No Food Insecurity  . Worried About Charity fundraiser in the Last Year: Never true  . Ran Out of Food in the Last Year: Never true  Transportation Needs: No Transportation Needs  . Lack of Transportation (Medical): No  . Lack of Transportation (Non-Medical): No  Physical Activity: Inactive  . Days of Exercise per Week: 0 days  . Minutes of Exercise per Session: 0 min  Stress: No Stress Concern Present  . Feeling of Stress : Not at all  Social Connections: Moderately Isolated  . Frequency of Communication with Friends and Family: More than three times a week  . Frequency of Social Gatherings with Friends and Family: More than three times a week  . Attends Religious Services: More than 4 times per year   . Active Member of Clubs or Organizations: No  . Attends Archivist Meetings: Never  . Marital Status: Widowed    Tobacco Counseling Counseling given: Not Answered   Clinical Intake:  Pre-visit preparation completed: Yes  Pain : 0-10 Pain Score: 2  Pain Type: Chronic pain Pain Location: Back Pain Orientation: Lower Pain Onset: More than a month ago Pain Frequency:  Intermittent Pain Relieving Factors: Resting  Pain Relieving Factors: Resting  Nutritional Risks: None Diabetes: No  How often do you need to have someone help you when you read instructions, pamphlets, or other written materials from your doctor or pharmacy?: 2 - Rarely What is the last grade level you completed in school?: 12th grade  Diabetic?No  Interpreter Needed?: No  Information entered by :: [place] of Daily Living In your present state of health, do you have any difficulty performing the following activities: 08/23/2020  Hearing? Y  Comment has bilateral hearing aids  Vision? N  Difficulty concentrating or making decisions? N  Walking or climbing stairs? Y  Dressing or bathing? N  Doing errands, shopping? N  Preparing Food and eating ? N  Using the Toilet? N  In the past six months, have you accidently leaked urine? N  Do you have problems with loss of bowel control? N  Managing your Medications? N  Managing your Finances? N  Housekeeping or managing your Housekeeping? N  Some recent data might be hidden    Patient Care Team: [name], MD as PCP - General Burnell Blanks, MD as PCP - Cardiology (Cardiology) Deboraha Sprang, MD as PCP - Electrophysiology (Cardiology)  Indicate any recent Medical Services you may have received from other than [facility] providers in the past year (date may be approximate).     Assessment:   This is a routine wellness examination for Jermany.  Hearing/Vision screen  Hearing Screening   125Hz  250Hz  500Hz  1000Hz   2000Hz  3000Hz  4000Hz  6000Hz  8000Hz   Right ear:           Left ear:           Vision Screening Comments: Patient states has not had eyes examined in a few year    Dietary issues and exercise activities discussed: Current Exercise Habits: The patient does not participate in regular exercise at present  Goals    . Exercise 3x per week (30 min per time)      Depression Screen PHQ 2/9 Scores 08/23/2020 05/24/2020 05/09/2020 05/09/2020 04/17/2018 04/17/2018  PHQ - 2 Score 0 3 3 0 4 4  PHQ- 9 Score - 16 13 - 14 14  Exception Documentation - - Medical reason Medical reason - -    Fall Risk Fall Risk  08/23/2020 05/24/2020 05/09/2020 04/17/2018 11/14/2017  Falls in the past year? 1 1 1  No Yes  Number falls in past yr: 1 1 1  - 2 or more  Comment 9x - - - -  Injury with Fall? 1 1 0 - -  Risk Factor Category  - - - - High Fall Risk  Risk for fall due to : History of fall(s);Impaired balance/gait;Medication side effect - - Impaired balance/gait;Impaired mobility -  Follow up Falls evaluation completed;Falls prevention discussed Falls evaluation completed - - -    FALL RISK PREVENTION PERTAINING TO THE HOME:  Any stairs in or around the home? No  If so, are there any without handrails? No  Home free of loose throw rugs in walkways, pet beds, electrical cords, etc? Yes  Adequate lighting in your home to reduce risk of falls? Yes   ASSISTIVE DEVICES UTILIZED TO PREVENT FALLS:  Life alert? No  Use of a cane, walker or w/c? Yes  Grab bars in the bathroom? Yes  Shower chair or bench in shower? No  Elevated toilet seat or a handicapped toilet? Yes     Cognitive Function: Normal  cognitive status assessed by direct observation by this Nurse Health Advisor. No abnormalities found.   MMSE - Mini Mental State Exam 11/14/2017  Orientation to time 5  Orientation to Place 5  Registration 3  Attention/ Calculation 5  Recall 3  Language- name 2 objects 2  Language- repeat 1  Language- follow 3 step  command 3  Language- read & follow direction 1  Write a sentence 1  Copy design 1  Total score 30        Immunizations Immunization History  Administered Date(s) Administered  . Influenza Split 07/31/2011, 07/23/2012  . Influenza Whole 04/25/2010  . Influenza, High Dose Seasonal PF 03/19/2016, 04/09/2017, 05/22/2018, 05/27/2020  . Pneumococcal Polysaccharide-23 11/17/2014  . Td 03/09/2016    TDAP status: Up to date  Flu Vaccine status: Up to date  Pneumococcal vaccine status: Due, Education has been provided regarding the importance of this vaccine. Advised may receive this vaccine at local pharmacy or Health Dept. Aware to provide a copy of the vaccination record if obtained from local pharmacy or Health Dept. Verbalized acceptance and understanding.  Covid-19 vaccine status: Information provided on how to obtain vaccines.   Qualifies for Shingles Vaccine? Yes   Zostavax completed No   Shingrix Completed?: No.    Education has been provided regarding the importance of this vaccine. Patient has been advised to call insurance company to determine out of pocket expense if they have not yet received this vaccine. Advised may also receive vaccine at local pharmacy or Health Dept. Verbalized acceptance and understanding.  Screening Tests Health Maintenance  Topic Date Due  . Hepatitis C Screening  Never done  . COVID-19 Vaccine (1) Never done  . URINE MICROALBUMIN  Never done  . PNA vac Low Risk Adult (2 of 2 - PCV13) 11/17/2015  . TETANUS/TDAP  03/09/2026  . INFLUENZA VACCINE  Completed    Health Maintenance  Health Maintenance Due  Topic Date Due  . Hepatitis C Screening  Never done  . COVID-19 Vaccine (1) Never done  . URINE MICROALBUMIN  Never done  . PNA vac Low Risk Adult (2 of 2 - PCV13) 11/17/2015    Colorectal cancer screening: Referral to GI placed 08/23/2020. Pt aware the office will call re: appt.  Lung Cancer Screening: (Low Dose CT Chest recommended if  Age 31-80 years, 30 pack-year currently smoking OR have quit w/in 15years.) does not qualify.   Lung Cancer Screening Referral: N/A   Additional Screening:  Hepatitis C Screening: does qualify;   Vision Screening: Recommended annual ophthalmology exams for early detection of glaucoma and other disorders of the eye. Is the patient up to date with their annual eye exam?  No  Who is the provider or what is the name of the office in which the patient attends annual eye exams? N/a  If pt is not established with a provider, would they like to be referred to a provider to establish care? No .   Dental Screening: Recommended annual dental exams for proper oral hygiene  Community Resource Referral / Chronic Care Management: CRR required this visit?  No   CCM required this visit?  Yes     Plan:     I have personally reviewed and noted the following in the patient's chart:   . Medical and social history . Use of alcohol, tobacco or illicit drugs  . Current medications and supplements . Functional ability and status . Nutritional status . Physical activity . Advanced directives . List  of other physicians . Hospitalizations, surgeries, and ER visits in previous 12 months . Vitals . Screenings to include cognitive, depression, and falls . Referrals and appointments  In addition, I have reviewed and discussed with patient certain preventive protocols, quality metrics, and best practice recommendations. A written personalized care plan for preventive services as well as general preventive health recommendations were provided to patient.     Ofilia Neas, LPN   [id number]   Nurse Notes: Patient has not taken Eliquis in quite some time due to cost of medication. States it cost him up to $400 and he is unable to afford. He has also discontinue Cardizem as he states he did not know what it was for. Explain he needs to be on medication and reason for medications. Also placed a referral to [facility]  for Pharmacist to go over medication adherence and see if there is any resources to help cover the cost of patient's eliquis

## 2020-08-23 NOTE — Addendum Note (Signed)
Addended by: Marrion Coy on: 08/23/2020 07:20 AM   Modules accepted: Orders

## 2020-08-23 NOTE — Patient Instructions (Signed)
[name] , Thank you for taking time to come for your Medicare Wellness Visit. I appreciate your ongoing commitment to your health goals. Please review the following plan we discussed and let me know if I can assist you in the future.   Screening recommendations/referrals: Colonoscopy: Currently due, orders placed this visit. Recommended yearly ophthalmology/optometry visit for glaucoma screening and checkup Recommended yearly dental visit for hygiene and checkup  Vaccinations: Influenza vaccine: Currently due, if you wish to receive you may do so in our office or at your local pharmacy.  Pneumococcal vaccine: Currently due for Prevar 13, you may receive in our office or at the [place]  Tdap vaccine: Up to date, next due 03/09/2026 Shingles vaccine: Currently due for Shingrix, if you wish to receive we recommend that you do so at your local pharmacy as it is less expensive.    Advanced directives: Please bring in copies of your advanced medical directives into our office so that we may scan them into our chart.   Conditions/risks identified: None   Next appointment: 08/24/2021 @ 11:15 am with [place] 65 Years and Older, Male Preventive care refers to lifestyle choices and visits with your health care provider that can promote health and wellness. What does preventive care include?  A yearly physical exam. This is also called an annual well check.  Dental exams once or twice a year.  Routine eye exams. Ask your health care provider how often you should have your eyes checked.  Personal lifestyle choices, including:  Daily care of your teeth and gums.  Regular physical activity.  Eating a healthy diet.  Avoiding tobacco and drug use.  Limiting alcohol use.  Practicing safe sex.  Taking low doses of aspirin every day.  Taking vitamin and mineral supplements as recommended by your health care provider. What happens during an annual well  check? The services and screenings done by your health care provider during your annual well check will depend on your age, overall health, lifestyle risk factors, and family history of disease. Counseling  Your health care provider may ask you questions about your:  Alcohol use.  Tobacco use.  Drug use.  Emotional well-being.  Home and relationship well-being.  Sexual activity.  Eating habits.  History of falls.  Memory and ability to understand (cognition).  Work and work Statistician. Screening  You may have the following tests or measurements:  Height, weight, and BMI.  Blood pressure.  Lipid and cholesterol levels. These may be checked every 5 years, or more frequently if you are over 82 years old.  Skin check.  Lung cancer screening. You may have this screening every year starting at age 53 if you have a 30-pack-year history of smoking and currently smoke or have quit within the past 15 years.  Fecal occult blood test (FOBT) of the stool. You may have this test every year starting at age 64.  Flexible sigmoidoscopy or colonoscopy. You may have a sigmoidoscopy every 5 years or a colonoscopy every 10 years starting at age 28.  Prostate cancer screening. Recommendations will vary depending on your family history and other risks.  Hepatitis C blood test.  Hepatitis B blood test.  Sexually transmitted disease (STD) testing.  Diabetes screening. This is done by checking your blood sugar (glucose) after you have not eaten for a while (fasting). You may have this done every 1-3 years.  Abdominal aortic aneurysm (AAA) screening. You may need this if you are  a current or former smoker.  Osteoporosis. You may be screened starting at age 68 if you are at high risk. Talk with your health care provider about your test results, treatment options, and if necessary, the need for more tests. Vaccines  Your health care provider may recommend certain vaccines, such  as:  Influenza vaccine. This is recommended every year.  Tetanus, diphtheria, and acellular pertussis (Tdap, Td) vaccine. You may need a Td booster every 10 years.  Zoster vaccine. You may need this after age 43.  Pneumococcal 13-valent conjugate (PCV13) vaccine. One dose is recommended after age 81.  Pneumococcal polysaccharide (PPSV23) vaccine. One dose is recommended after age 32. Talk to your health care provider about which screenings and vaccines you need and how often you need them. This information is not intended to replace advice given to you by your health care provider. Make sure you discuss any questions you have with your health care provider. Document Released: 08/05/2015 Document Revised: 03/28/2016 Document Reviewed: 05/10/2015 Elsevier Interactive Patient Education  2017 [place] Prevention in the Home Falls can cause injuries. They can happen to people of all ages. There are many things you can do to make your home safe and to help prevent falls. What can I do on the outside of my home?  Regularly fix the edges of walkways and driveways and fix any cracks.  Remove anything that might make you trip as you walk through a door, such as a raised step or threshold.  Trim any bushes or trees on the path to your home.  Use bright outdoor lighting.  Clear any walking paths of anything that might make someone trip, such as rocks or tools.  Regularly check to see if handrails are loose or broken. Make sure that both sides of any steps have handrails.  Any raised decks and porches should have guardrails on the edges.  Have any leaves, snow, or ice cleared regularly.  Use sand or salt on walking paths during winter.  Clean up any spills in your garage right away. This includes oil or grease spills. What can I do in the bathroom?  Use night lights.  Install grab bars by the toilet and in the tub and shower. Do not use towel bars as grab bars.  Use  non-skid mats or decals in the tub or shower.  If you need to sit down in the shower, use a plastic, non-slip stool.  Keep the floor dry. Clean up any water that spills on the floor as soon as it happens.  Remove soap buildup in the tub or shower regularly.  Attach bath mats securely with double-sided non-slip rug tape.  Do not have throw rugs and other things on the floor that can make you trip. What can I do in the bedroom?  Use night lights.  Make sure that you have a light by your bed that is easy to reach.  Do not use any sheets or blankets that are too big for your bed. They should not hang down onto the floor.  Have a firm chair that has side arms. You can use this for support while you get dressed.  Do not have throw rugs and other things on the floor that can make you trip. What can I do in the kitchen?  Clean up any spills right away.  Avoid walking on wet floors.  Keep items that you use a lot in easy-to-reach places.  If you need to reach something  above you, use a strong step stool that has a grab bar.  Keep electrical cords out of the way.  Do not use floor polish or wax that makes floors slippery. If you must use wax, use non-skid floor wax.  Do not have throw rugs and other things on the floor that can make you trip. What can I do with my stairs?  Do not leave any items on the stairs.  Make sure that there are handrails on both sides of the stairs and use them. Fix handrails that are broken or loose. Make sure that handrails are as long as the stairways.  Check any carpeting to make sure that it is firmly attached to the stairs. Fix any carpet that is loose or worn.  Avoid having throw rugs at the top or bottom of the stairs. If you do have throw rugs, attach them to the floor with carpet tape.  Make sure that you have a light switch at the top of the stairs and the bottom of the stairs. If you do not have them, ask someone to add them for you. What  else can I do to help prevent falls?  Wear shoes that:  Do not have high heels.  Have rubber bottoms.  Are comfortable and fit you well.  Are closed at the toe. Do not wear sandals.  If you use a stepladder:  Make sure that it is fully opened. Do not climb a closed stepladder.  Make sure that both sides of the stepladder are locked into place.  Ask someone to hold it for you, if possible.  Clearly mark and make sure that you can see:  Any grab bars or handrails.  First and last steps.  Where the edge of each step is.  Use tools that help you move around (mobility aids) if they are needed. These include:  Canes.  Walkers.  Scooters.  Crutches.  Turn on the lights when you go into a dark area. Replace any light bulbs as soon as they burn out.  Set up your furniture so you have a clear path. Avoid moving your furniture around.  If any of your floors are uneven, fix them.  If there are any pets around you, be aware of where they are.  Review your medicines with your doctor. Some medicines can make you feel dizzy. This can increase your chance of falling. Ask your doctor what other things that you can do to help prevent falls. This information is not intended to replace advice given to you by your health care provider. Make sure you discuss any questions you have with your health care provider. Document Released: 05/05/2009 Document Revised: 12/15/2015 Document Reviewed: 08/13/2014 Elsevier Interactive Patient Education  2017 [employer].

## 2020-08-24 ENCOUNTER — Telehealth: Payer: Self-pay | Admitting: Cardiovascular Disease

## 2020-08-24 NOTE — Telephone Encounter (Signed)
Has not taken Eliquis in 2 months because he has not been able to afford.  After further discussion, he said he's not really been any good at taking his meds since his wife died last 09/18/2022.  He is taking diltiazem daily.  Does not know when he is in afib, no symptoms other than "staying tired" 133/90, 67 this morning.   I moved up his overdue appointment to this Friday with Dr. [name].

## 2020-08-24 NOTE — Telephone Encounter (Signed)
New Message:   Pt said he can not afford Eliquis, he would like to do the generic please.

## 2020-08-25 LAB — DRUG MONITOR, PANEL 1, W/CONF, URINE
Amphetamines: NEGATIVE ng/mL (ref <500)
Barbiturates: NEGATIVE ng/mL (ref <300)
Benzodiazepines: NEGATIVE ng/mL (ref <100)
Cocaine Metabolite: NEGATIVE ng/mL (ref <150)
Codeine: NEGATIVE ng/mL (ref <50)
Creatinine: 158.6 mg/dL
Hydrocodone: NEGATIVE ng/mL (ref <50)
Hydromorphone: NEGATIVE ng/mL (ref <50)
Marijuana Metabolite: NEGATIVE ng/mL (ref <20)
Methadone Metabolite: NEGATIVE ng/mL (ref <100)
Morphine: NEGATIVE ng/mL (ref <50)
Norhydrocodone: NEGATIVE ng/mL (ref <50)
Noroxycodone: 5388 ng/mL — ABNORMAL HIGH (ref <50)
Opiates: NEGATIVE ng/mL (ref <100)
Oxidant: NEGATIVE ug/mL
Oxycodone: 4712 ng/mL — ABNORMAL HIGH (ref <50)
Oxycodone: POSITIVE ng/mL — AB (ref <100)
Oxymorphone: 4743 ng/mL — ABNORMAL HIGH (ref <50)
Phencyclidine: NEGATIVE ng/mL (ref <25)
pH: 5.5 (ref 4.5–9.0)

## 2020-08-25 LAB — DM TEMPLATE

## 2020-08-26 ENCOUNTER — Other Ambulatory Visit: Payer: Self-pay

## 2020-08-26 ENCOUNTER — Encounter: Payer: Self-pay | Admitting: Cardiovascular Disease

## 2020-08-26 ENCOUNTER — Ambulatory Visit: Payer: Medicare Other | Admitting: Cardiovascular Disease

## 2020-08-26 VITALS — BP 144/80 | HR 85 | Ht 70.0 in | Wt 226.0 lb

## 2020-08-26 DIAGNOSIS — E785 Hyperlipidemia, unspecified: Secondary | ICD-10-CM | POA: Diagnosis not present

## 2020-08-26 DIAGNOSIS — I2510 Atherosclerotic heart disease of native coronary artery without angina pectoris: Secondary | ICD-10-CM

## 2020-08-26 DIAGNOSIS — I4892 Unspecified atrial flutter: Secondary | ICD-10-CM | POA: Diagnosis not present

## 2020-08-26 DIAGNOSIS — I5032 Chronic diastolic (congestive) heart failure: Secondary | ICD-10-CM

## 2020-08-26 DIAGNOSIS — I495 Sick sinus syndrome: Secondary | ICD-10-CM

## 2020-08-26 MED ORDER — ASPIRIN EC 81 MG PO TBEC: 81.0000 mg | DELAYED_RELEASE_TABLET | Freq: Every day | ORAL | 3 refills | Status: DC

## 2020-08-26 NOTE — Patient Instructions (Signed)
Medication Instructions:  Your physician has recommended you make the following change in your medication:  1.) restart aspirin 81 mg daily  *If you need a refill on your cardiac medications before your next appointment, please call your pharmacy*   Lab Work: none If you have labs (blood work) drawn today and your tests are completely normal, you will receive your results only by: [name] MyChart Message (if you have MyChart) OR . A paper copy in the mail If you have any lab test that is abnormal or we need to change your treatment, we will call you to review the results.   Testing/Procedures: none   Follow-Up: At [facility], you and your health needs are our priority.  As part of our continuing mission to provide you with exceptional heart care, we have created designated Provider Care Teams.  These Care Teams include your primary Cardiologist (physician) and Advanced Practice Providers (APPs -  Physician Assistants and Nurse Practitioners) who all work together to provide you with the care you need, when you need it.  We recommend signing up for the patient portal called "MyChart".  Sign up information is provided on this After Visit Summary.  MyChart is used to connect with patients for Virtual Visits (Telemedicine).  Patients are able to view lab/test results, encounter notes, upcoming appointments, etc.  Non-urgent messages can be sent to your provider as well.   To learn more about what you can do with MyChart, go to [email or web address].    Your next appointment:   12 month(s)  The format for your next appointment:   In Person  Provider:   You may see [name], MD or one of the following Advanced Practice Providers on your designated Care Team:    Melina Copa, PA-C  Ermalinda Barrios, PA-C    Other Instructions

## 2020-08-26 NOTE — Progress Notes (Signed)
Chief Complaint  Patient presents with  . Follow-up    CAD    History of Present Illness: 80 yo male with history of CAD, HTN, HLD, sleep apnea, sinus node dysfunction s/p pacemaker, atrial flutter here today for follow up. Cardiac caths in 2009 and 2014 with moderate non-obstructive CAD. He has not tolerated statins. He was admitted to [facility] April 2016 with unstable angina. Cardiac cath with severe disease in the LAD and RCA, both treated with drug eluting stents. Repeat cath May 2016 due to dyspnea with stable disease. Symptoms resolved off of Brilinta. Cardiac monitor June 2016 with PACs, PVCs. He has had severe anemia due to angiodysplasia. Cardiac monitor May 2017 with nocturnal bradycardia with rates as low as 22 bpm with blocked PACs. Echo June 2017 with normal LV systolic function, grade 1 diastolic dysfunction, moderate LVH. He was seen in the EP clinic by Dr. Lennie Odor 12/30/15 and sleep study was arranged but he did not keep this appt. He did not tolerate Norvasc due to LE edema and did not tolerate beta blockers due to bradycardia. I saw him 12/03/16 and he c/o episodes of dizziness but no palpitations. I arranged a cardiac monitor which showed atrial fib/flutter and long pauses. Xarelto was started. Plavix was stopped. He was seen by EP and a pacemaker was implanted on 12/24/16. Synthroid increased due to high TSH. Following his pacemaker implantation, he has c/o dry cough and he was treated with a round of antibiotics by primary care. He also had c/o chest pain and dizziness. He was seen in our office 01/08/17 by Melina Copa, PA-C and based on is complaints, he was admitted to [facility] from our office. Echo 01/08/17 with normal LV size and function and no valve issues. Nuclear stress test showed a large apical/anteroapical and inferoapical scar but no ischemia. D-dimer negative. BNP was normal. Troponin negative. I saw him in my office 01/14/17 and he felt terrible with c/o weakness, fatigue, aching in  legs, rare chest pains. TSH was elevated in June and Synthroid was increased. He was seen in primary care 01/16/17 and no new recommendations given. Carotid dopplers June 2017 with mild bllateral disease. Nuclear stress test July 2020 with no ischemia. Echo July 2020 with LVEF=60-65%, no valve disease. I saw him by video visit January 2021 and he was doing well. He had stopped his Eliquis due to insurance issues but we restarted at that visit. He has since stopped the Eliquis due to cost.   He is here today for follow up. The patient denies any chest pain,palpitations, lower extremity edema, orthopnea, PND, dizziness, near syncope or syncope. He has some chronic dyspnea.   Primary Care Physician: [name], MD  Past Medical History:  Diagnosis Date  . Adenomatous polyp of colon 2007  . Arthritis   . Atrial flutter ([place])   . AVM (arteriovenous malformation) 2011   a. S/p argon plasma coagulation and ablation in 2011, 2017.  . Bradycardia    a. H/o almost 7sec pause nocturnally during 2011 admission. Also has h/o fatigue with BB.  [name] CAD (coronary artery disease)    a. Nonobst disease by cath 2009. b. s/p PCI 10/2014 with DES to [place], patent by relook 11/2014 (Brilinta changed to Plavix with improved sx).  . Chronic diastolic CHF (congestive heart failure) ([place])   . Depression   . Diverticulosis   . Gastritis 2011  . GERD (gastroesophageal reflux disease)   . History of hiatal hernia   .  Hyperlipidemia   . Hypertension   . Hypothyroidism   . Myocardial infarction ([place])   . Obstructive sleep apnea    -no cpap use  . Premature atrial contractions Holter 2016  . PVC's (premature ventricular contractions) Holter 2016  . Statin intolerance   . Transfusion history    several years ago -GI bleed    Past Surgical History:  Procedure Laterality Date  . ANKLE SURGERY Left   . APPENDECTOMY    . CARDIAC CATHETERIZATION N/A 11/26/2014   Procedure: Left Heart Cath and Coronary  Angiography;  Surgeon: Burnell Blanks, MD;  Location: [place];  Service: Cardiovascular;  Laterality: N/A;  . CHOLECYSTECTOMY N/A 08/23/2016   Procedure: LAPAROSCOPIC CHOLECYSTECTOMY;  Surgeon: Coralie Keens, MD;  Location: [facility];  Service: General;  Laterality: N/A;  . COLONOSCOPY  08-23-05   per Dr. Deatra Ina, adenomatous polyps, repeat in 5 yrs   . COLONOSCOPY WITH PROPOFOL N/A 12/06/2015   Procedure: COLONOSCOPY WITH PROPOFOL;  Surgeon: Doran Stabler, MD;  Location: [facility];  Service: Gastroenterology;  Laterality: N/A;  . ENTEROSCOPY N/A 12/06/2015   Procedure: ENTEROSCOPY;  Surgeon: Doran Stabler, MD;  Location: [facility];  Service: Gastroenterology;  Laterality: N/A;  . ESOPHAGOGASTRODUODENOSCOPY  08-23-05   per Dr. Deatra Ina, cauterized jejunal AVMs   . HERNIA REPAIR    . HOT HEMOSTASIS N/A 12/06/2015   Procedure: HOT HEMOSTASIS (ARGON PLASMA COAGULATION/BICAP);  Surgeon: Doran Stabler, MD;  Location: [facility];  Service: Gastroenterology;  Laterality: N/A;  . LEFT HEART CATHETERIZATION WITH CORONARY ANGIOGRAM N/A 09/08/2012   Procedure: LEFT HEART CATHETERIZATION WITH CORONARY ANGIOGRAM;  Surgeon: Burnell Blanks, MD;  Location: [facility] CATH LAB;  Service: Cardiovascular;  Laterality: N/A;  . LEFT HEART CATHETERIZATION WITH CORONARY ANGIOGRAM N/A 11/16/2014   Procedure: LEFT HEART CATHETERIZATION WITH CORONARY ANGIOGRAM;  Surgeon: Leonie Man, MD;  Location: [facility] C[facility] LAB;  Service: Cardiovascular;  Laterality: N/A;  . PACEMAKER IMPLANT N/A 12/24/2016   Procedure: Pacemaker Implant;  Surgeon: Deboraha Sprang, MD;  Location: [place] [facility];  Service: Cardiovascular;  Laterality: N/A;  . SKIN GRAFT Right    leg  . TONSILLECTOMY      Current Outpatient Medications  Medication Sig Dispense Refill  . Ascorbic Acid (VITAMIN C PO) Take 4 tablets by mouth daily as needed (flu like symptopms).     [name] aspirin EC 81 MG tablet Take 1 tablet (81 mg total) by  mouth daily. Swallow whole. 90 tablet 3  . diclofenac sodium (VOLTAREN) 1 % GEL Apply 1 application topically daily as needed (ankle pain). 5 Tube 10  . diltiazem (CARDIZEM CD) 120 MG 24 hr capsule Take 1 capsule (120 mg total) by mouth daily. 90 capsule 2  . fluticasone (FLONASE) 50 MCG/ACT nasal spray Place 1 spray into both nostrils daily as needed for allergies or rhinitis. 48 g 3  . levothyroxine (SYNTHROID) 175 MCG tablet TAKE 1 TABLET BY MOUTH EVERY DAY BEFORE BREAKFAST 90 tablet 2  . nitroGLYCERIN (NITROSTAT) 0.4 MG SL tablet Place 1 tablet (0.4 mg total) under the tongue every 5 (five) minutes as needed for chest pain. 25 tablet 3  . omeprazole (PRILOSEC) 40 MG capsule Take 1 capsule (40 mg total) by mouth daily. 90 capsule 3  . oxycodone (ROXICODONE) 30 MG immediate release tablet Take 1 tablet (30 mg total) by mouth every 6 (six) hours as needed for pain. 120 tablet 0  . ropinirole (REQUIP) 5 MG tablet  Take 1 tablet (5 mg total) by mouth in the morning, at noon, in the evening, and at bedtime. 360 tablet 3  . clonazePAM (KLONOPIN) 1 MG tablet TAKE 1 TABLET BY MOUTH 3 TIMES DAILY AS NEEDED FOR ANXIETY. (Patient not taking: Reported on 08/26/2020) 90 tablet 5  . gabapentin (NEURONTIN) 100 MG capsule TAKE 1 CAPSULE BY MOUTH THREE TIMES A DAY (Patient not taking: No sig reported) 90 capsule 5   No current facility-administered medications for this visit.    Allergies  Allergen Reactions  . Brilinta [Ticagrelor] Shortness Of Breath  . Colchicine     Syncope-causes patient to pass out  . Metoprolol Tartrate Other (See Comments)    Severe chest pains " flat lined patient"  . Mirapex [Pramipexole Dihydrochloride]     Severe leg pain  . Shellfish Allergy Anaphylaxis and Hives  . Statins Other (See Comments)    All statins cause myalgias   . Zolpidem Other (See Comments)    Chest pain  . Coconut Oil Hives  . Levaquin [Levofloxacin] Hives  . Trazodone And Nefazodone Other (See Comments)     Unsteady on feet    Social History   Socioeconomic History  . Marital status: Widowed    Spouse name: Not on file  . Number of children: 1  . Years of education: Not on file  . Highest education level: Not on file  Occupational History  . Occupation: Disabled    Employer: UNEMPLOYED  Tobacco Use  . Smoking status: Former Smoker    Packs/day: 2.00    Years: 50.00    Pack years: 100.00    Types: Cigarettes    Quit date: 07/23/2002    Years since quitting: 18.1  . Smokeless tobacco: Never Used  Vaping Use  . Vaping Use: Never used  Substance and Sexual Activity  . Alcohol use: No    Alcohol/week: 0.0 standard drinks  . Drug use: No  . Sexual activity: Not on file  Other Topics Concern  . Not on file  Social History Narrative  . Not on file   Social Determinants of Health   Financial Resource Strain: Medium Risk  . Difficulty of Paying Living Expenses: Somewhat hard  Food Insecurity: No Food Insecurity  . Worried About Charity fundraiser in the Last Year: Never true  . Ran Out of Food in the Last Year: Never true  Transportation Needs: No Transportation Needs  . Lack of Transportation (Medical): No  . Lack of Transportation (Non-Medical): No  Physical Activity: Inactive  . Days of Exercise per Week: 0 days  . Minutes of Exercise per Session: 0 min  Stress: No Stress Concern Present  . Feeling of Stress : Not at all  Social Connections: Moderately Isolated  . Frequency of Communication with Friends and Family: More than three times a week  . Frequency of Social Gatherings with Friends and Family: More than three times a week  . Attends Religious Services: More than 4 times per year  . Active Member of Clubs or Organizations: No  . Attends Archivist Meetings: Never  . Marital Status: Widowed  Intimate Partner Violence: Not At Risk  . Fear of Current or Ex-Partner: No  . Emotionally Abused: No  . Physically Abused: No  . Sexually Abused: No     Family History  Problem Relation Age of Onset  . Leukemia Mother   . Heart disease Father   . Stroke Father   . Heart attack Father   .  Alcoholism Paternal Uncle   . Alcoholism Maternal Grandfather   . Colon cancer Neg Hx   . Esophageal cancer Neg Hx     Review of Systems:  As stated in the HPI and otherwise negative.   BP (!) 144/80   Pulse 85   Ht 5\' 10"  (1.778 m)   Wt 226 lb (102.5 kg)   SpO2 96%   BMI 32.43 kg/m   Physical Examination: General: Well developed, well nourished, NAD  HEENT: OP clear, mucus membranes moist  SKIN: warm, dry. No rashes. Neuro: No focal deficits  Musculoskeletal: Muscle strength 5/5 all ext  Psychiatric: Mood and affect normal  Neck: No JVD, no carotid bruits, no thyromegaly, no lymphadenopathy.  Lungs:Clear bilaterally, no wheezes, rhonci, crackles Cardiovascular: Regular rate and rhythm. No murmurs, gallops or rubs. Abdomen:Soft. Bowel sounds present. Non-tender.  Extremities: No lower extremity edema. Pulses are 2 + in the bilateral DP/PT.  Echo 01/08/17: Left ventricle: The cavity size was normal. Wall thickness was   increased in a pattern of mild LVH. Systolic function was normal.   The estimated ejection fraction was in the range of 60% to 65%.   Wall motion was normal; there were no regional wall motion   abnormalities. Doppler parameters are consistent with abnormal   left ventricular relaxation (grade 1 diastolic dysfunction). - Aortic valve: Transvalvular velocity was within the normal range.   There was no stenosis. There was no regurgitation. - Mitral valve: Mildly calcified annulus. Transvalvular velocity   was within the normal range. There was no evidence for stenosis.   There was no regurgitation. - Left atrium: The atrium was mildly dilated. - Right ventricle: The cavity size was normal. Wall thickness was   normal. Systolic function was normal. - Atrial septum: No defect or patent foramen ovale was identified. -  Tricuspid valve: There was trivial regurgitation.  Cardiac cath 11/26/14: Left Anterior Descending   . Ost LAD to Prox LAD lesion, 50% stenosed. discrete . The lesion was not previously treated.   . Prox LAD to Mid LAD lesion, 0% stenosed. Previously placed Prox LAD to Mid LAD stent (unknown type) is patent.   Jorene Minors LAD lesion, 25% stenosed.      Left Circumflex   . Mid Cx lesion, 30% stenosed.     Right Coronary Artery   . Prox RCA lesion, 20% stenosed.   . Mid RCA lesion, 40% stenosed.   . Mid RCA to Dist RCA lesion, 30% stenosed.   . Right Posterior Descending Artery   . RPDA lesion, 0% stenosed. Previously placed RPDA drug eluting stent is patent.     EKG:  EKG is not ordered today. The ekg ordered today demonstrates    Recent Labs: 02/20/2020: ALT 22; BUN 22; Creatinine, Ser 1.20; Hemoglobin 15.0; Platelets 265; Potassium 3.5; Sodium 137   Lipid Panel    Component Value Date/Time   CHOL 148 12/11/2017 0837   TRIG 113 12/11/2017 0837   HDL 38 (L) 12/11/2017 0837   CHOLHDL 3.9 12/11/2017 0837   CHOLHDL 4.6 03/22/2016 0935   VLDL 46 (H) 03/22/2016 0935   LDLCALC 87 12/11/2017 0837     Wt Readings from Last 3 Encounters:  08/26/20 226 lb (102.5 kg)  05/24/20 215 lb (97.5 kg)  05/09/20 219 lb 6.4 oz (99.5 kg)     Other studies Reviewed: Additional studies/ records that were reviewed today include: . Review of the above records demonstrates:    Assessment and Plan:   1.  CAD with stable angina:No chest pain. Nuclear stress test without ischemia July 2020. He has been off of ASA since restarting Eliquis. Since he stopped Eliquis, will restart ASA 81 mg daily. He is statin intolerant. He stopped the Zetia. He does not wish to restart. He refuses to take beta blockers.   2. Chronic diastolic CHF: No volume overload on exam.   3. Hyperlipidemia:Intolerant of statins. He does not wish to take Zetia. He does not wish to be referred to the lipid clinic for any  other therapies.   4. Sinus node dysfunction:s/p pacemaker.Followed by Dr. [name]. Will touch base with the device clinic to see if they want to interrogate his device today. He reports being unable to transmit from home.   5. Atrial flutter, paroxysmal:Sinus today on exam. He is followed by Dr. [name]. He has been out of Eliquis due to cost. He does not wish to restart. He does not wish to consider coumadin. I have explained the risk of CVA with recurrent atrial fib/flutter. Will continue Cardizem. Can consider atrial flutter ablation with recurrence.  Current medicines are reviewed at length with the patient today.  The patient does not have concerns regarding medicines.  The following changes have been made:  no change  Labs/ tests ordered today include:   No orders of the defined types were placed in this encounter.  Disposition:   FU with me in 12 months    Signed, [name], MD 08/26/2020 4:03 PM    [facility] [address], [place], [place]  [address] Phone: [phone]; Fax: [phone]

## 2020-09-13 ENCOUNTER — Telehealth: Payer: Self-pay | Admitting: Cardiovascular Disease

## 2020-09-13 IMAGING — CT CT ABD-PELV W/ CM
2 of 5 series · 15 of 46 positions shown, 17 images · IV contrast (Omnipaque)
Comparison: CT abdomen dated 09/01/2018.

CLINICAL DATA: Increasing pain to LEFT groin.  LEFT-sided hernia.

EXAM:
CT ABDOMEN AND PELVIS WITH CONTRAST
TECHNIQUE: Multidetector CT imaging of the abdomen and pelvis was performed
using the standard protocol following bolus administration of
intravenous contrast.
CONTRAST:  100mL OMNIPAQUE IOHEXOL 300 MG/ML  SOLN

[Series 2: axial st · axial · 0.96mm/px · z∈[-567,-62]mm · 12 of 113 slices shown, 14 images]
[im 6/113  soft-tissue]
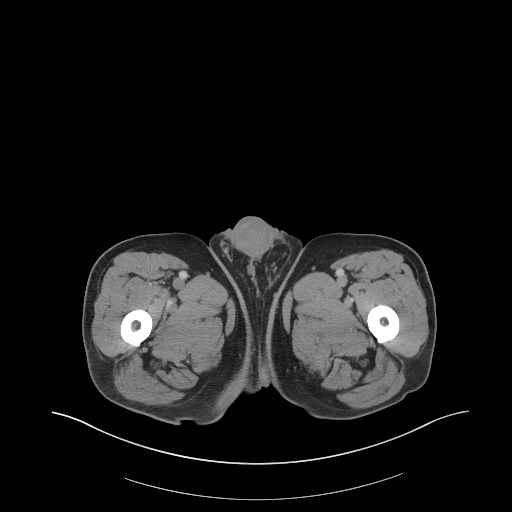
[im 6/113  bone]
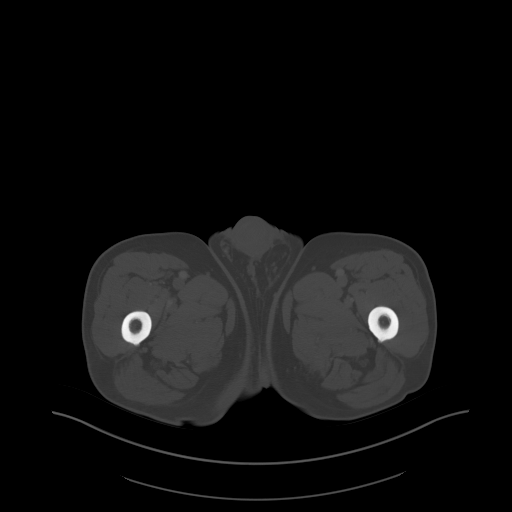
[im 17/113  soft-tissue]
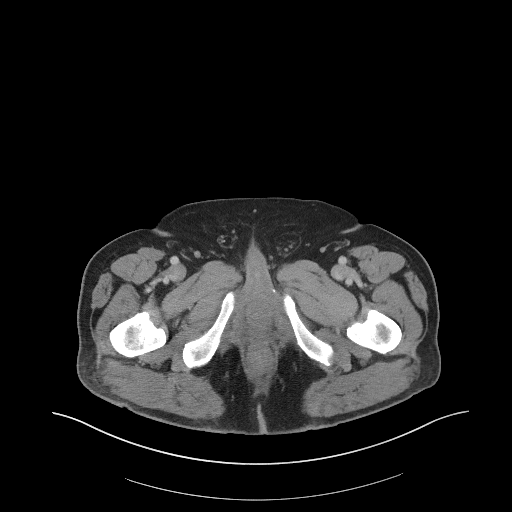
[im 27/113  soft-tissue]
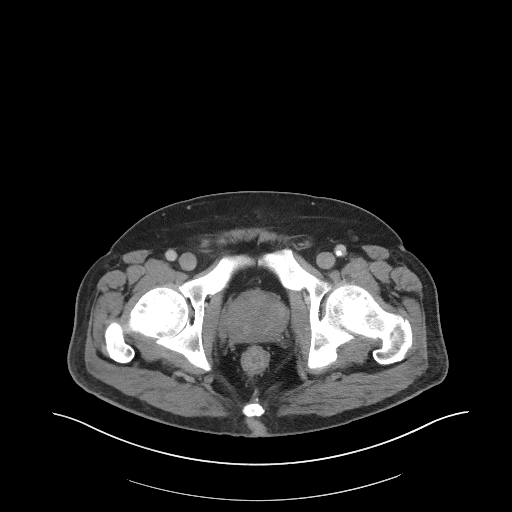
[im 33/113  soft-tissue]
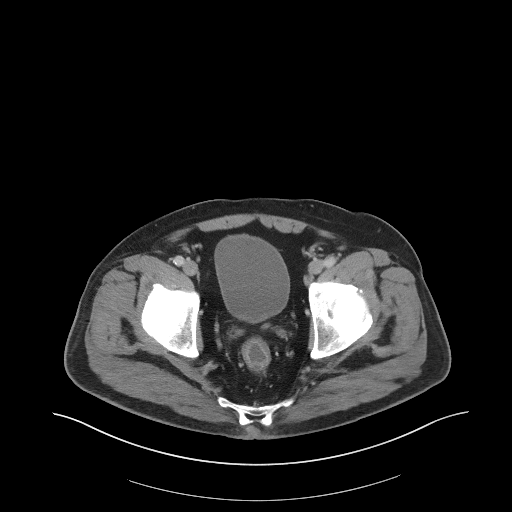
[im 43/113  soft-tissue]
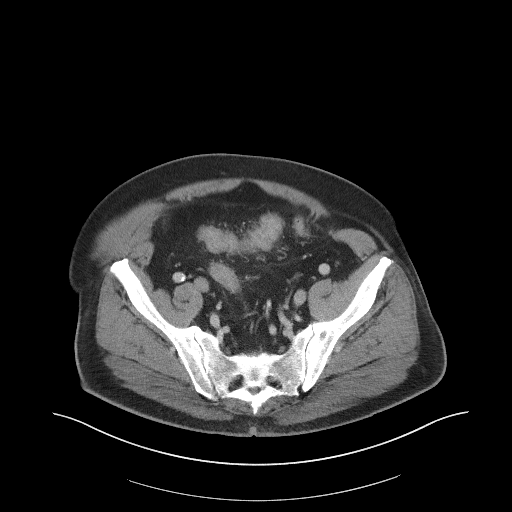
[im 54/113  soft-tissue]
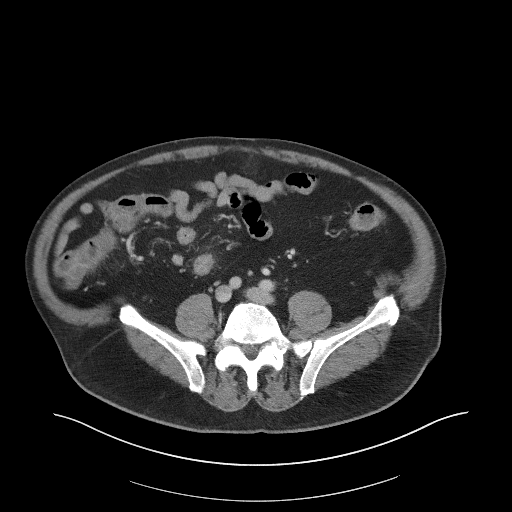
[im 59/113  soft-tissue]
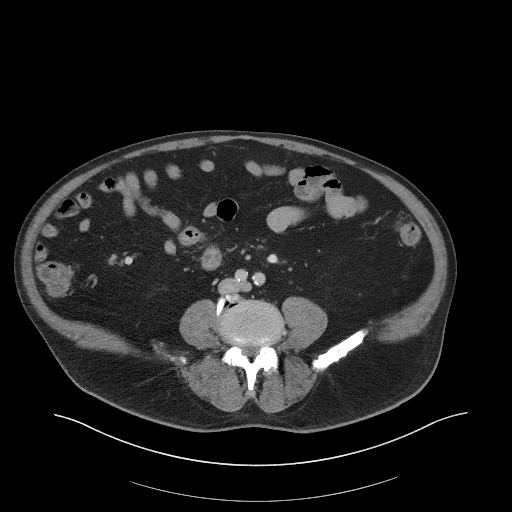
[im 70/113  soft-tissue]
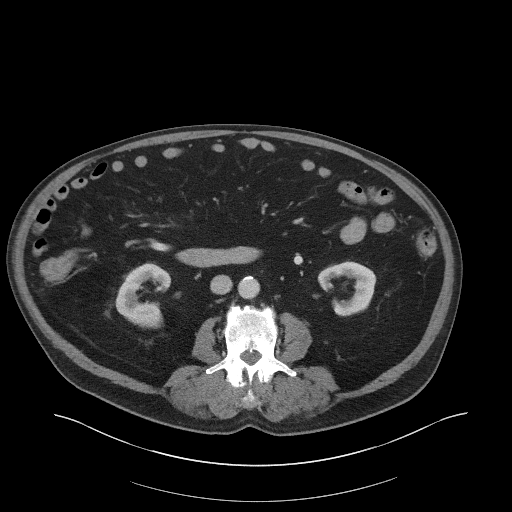
[im 81/113  soft-tissue]
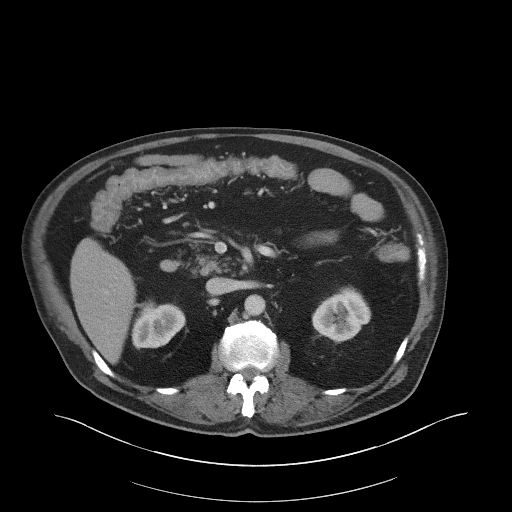
[im 81/113  bone]
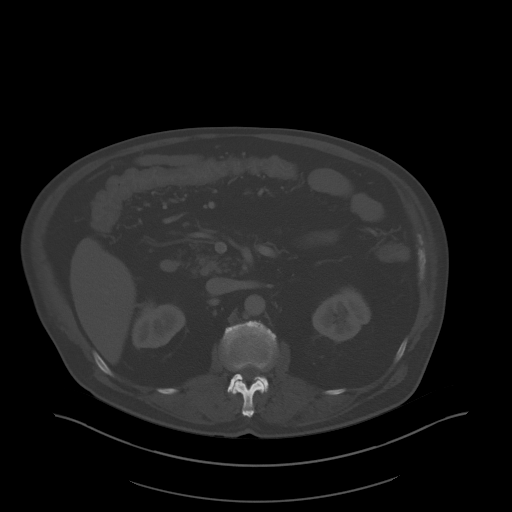
[im 86/113  soft-tissue]
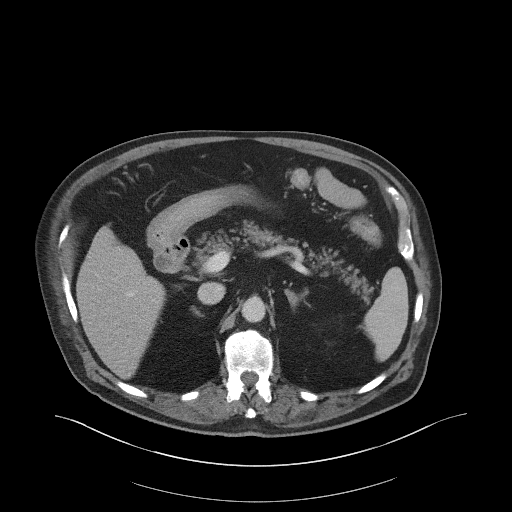
[im 97/113  soft-tissue]
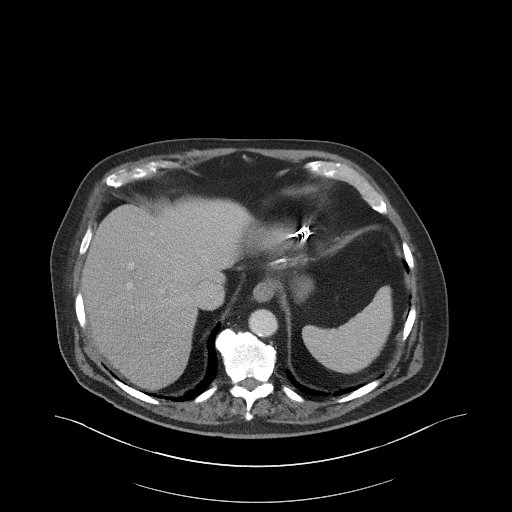
[im 107/113  soft-tissue]
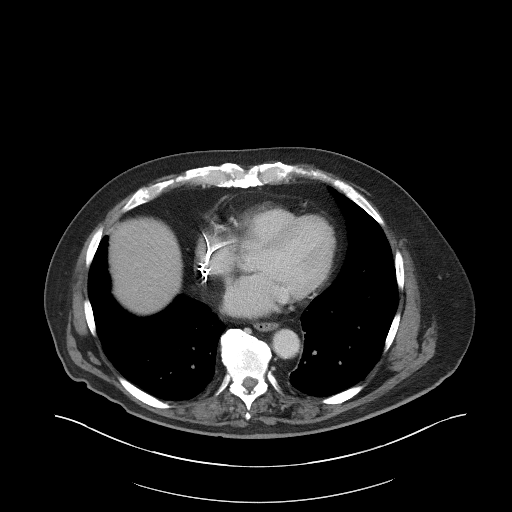

[Series 5: coronal st · coronal · 0.98mm/px · 3 of 105 slices shown]
[im 35/105  soft-tissue]
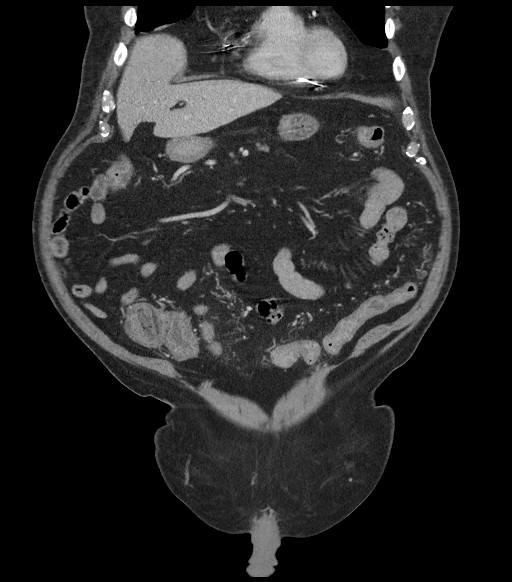
[im 47/105  soft-tissue]
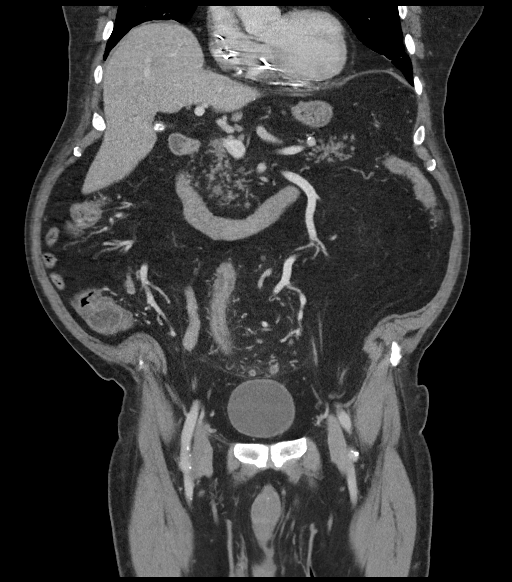
[im 58/105  soft-tissue]
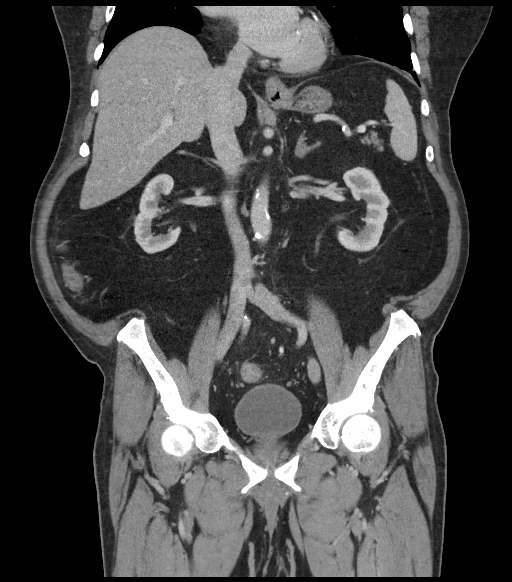

[15 of 46 positions shown; findings below may reference images not displayed]

FINDINGS: Lower chest: No acute abnormality.

Hepatobiliary: No focal liver abnormality is seen. Status post
cholecystectomy. No biliary dilatation.

Pancreas: Partially infiltrated with fat but otherwise unremarkable.

Spleen: Normal in size without focal abnormality.

Adrenals/Urinary Tract: Adrenal glands appear normal. Kidneys are
unremarkable without mass, stone or hydronephrosis. No ureteral or
bladder calculi are identified. Bladder appears normal, partially
decompressed.

Stomach/Bowel: Scattered diverticulosis within the descending and
sigmoid colon but no focal wall inflammation to suggest acute
diverticulitis. At least mild thickening of the walls of the
majority of the colon, most pronounced at the level of the
rectosigmoid colon, suspicious for a diffuse colitis. Appendix is
not seen but there are no inflammatory changes about the cecum to
suggest acute appendicitis. Stomach is unremarkable, partially
decompressed.

Vascular/Lymphatic: Aortic atherosclerosis. No acute appearing
vascular abnormality. No enlarged lymph nodes seen in the abdomen or
pelvis.

Reproductive: Prostate gland is enlarged causing slight mass effect
on the bladder base.

Other: No free fluid or abscess collection. No free intraperitoneal
air.

Musculoskeletal: No acute or suspicious osseous finding.
Degenerative spondylosis of the lumbar spine, mild to moderate in
degree, with suspected neural foramen encroachment at the L4-5 and
L5-S1 levels with possible associated nerve root impingement.

Small umbilical hernia which contains fat only. Small LEFT inguinal
hernia which contains fat only. No herniated bowel loops.
IMPRESSION: 1. At least mild thickening of the walls of the majority of the
colon, most pronounced at the level of the rectosigmoid colon,
suspicious for a diffuse colitis of infectious or inflammatory
nature.
2. LEFT inguinal hernia which contains fat only. The herniated fat
extends to the level of the LEFT scrotum. No herniated bowel.
3. Colonic diverticulosis without evidence of acute diverticulitis.
4. Prostate gland is enlarged causing mass effect on the bladder
base. Recommend correlation with physical exam findings and/or PSA
lab values.
5. Degenerative spondylosis of the lumbar spine, mild to moderate in
degree, with suspected neural foramen encroachment at the L4-5 and
L5-S1 levels with possible associated nerve root impingement. If any
radiculopathic symptoms, would consider nonemergent lumbar spine MRI
for further characterization.

Aortic Atherosclerosis ([id number]).

## 2020-09-13 MED ORDER — APIXABAN 5 MG PO TABS: 5.0000 mg | ORAL_TABLET | Freq: Two times a day (BID) | ORAL | 3 refills | Status: DC

## 2020-09-13 NOTE — Telephone Encounter (Signed)
Prescription refill request for Eliquis received. Indication: a flutter Last office visit: 08/26/20 Scr: 1.2 Age: 80 Weight: 102kg  Patient's dose would be 5mg  BID.  Forwarding to Dr Alyse Low nurse since he is out of the clinic today

## 2020-09-13 NOTE — Telephone Encounter (Signed)
*  STAT* If patient is at the pharmacy, call can be transferred to refill team.   1. Which medications need to be refilled? (please list name of each medication and dose if known) Eliquis 5mg   2. Which pharmacy/location (including street and city if local pharmacy) is medication to be sent to? Patient states he will come pick it up and drop it off at the [facility] in [place] because it is cheaper.   3. Do they need a 30 day or 90 day supply? 90   Patient has not been able to take the medication due to the cost, he wants to know if a new prescription can be written because he can get it cheaper through the [facility]. He states that he will come by the office and pick it up because he has to go by there this afternoon. Please advise.

## 2020-09-13 NOTE — Telephone Encounter (Signed)
Patient would like a prescription for Eliqiuis 5 mg BID mailed to him.  He will take to [facility] where it will be affordable.   Per last office visit note Eliquis was recommended but pt did not want to start due to expense.    Prescription printed for Dr. Camillia Herter signature and will mail to his home tomorrow.  Confirmed address.   Pt aware to stop aspirin once he starts Eliquis.

## 2020-09-15 ENCOUNTER — Telehealth: Payer: Self-pay | Admitting: Family Medicine

## 2020-09-15 NOTE — Telephone Encounter (Signed)
FYI:  Pt is calling in to make [place] aware that he has not requested a glucose meter from anyone and if someone send anything in please deny it b/c he can get it from the [facility] if he needs one.

## 2020-09-19 ENCOUNTER — Telehealth: Payer: Self-pay | Admitting: Family Medicine

## 2020-09-19 MED ORDER — OXYCODONE HCL 30 MG PO TABS: 30.0000 mg | ORAL_TABLET | Freq: Four times a day (QID) | ORAL | 0 refills | Status: DC | PRN

## 2020-09-19 NOTE — Telephone Encounter (Signed)
Refilled for 2 more months

## 2020-09-19 NOTE — Telephone Encounter (Signed)
Pt is calling in stating that he needs a refill on Rx oxycodone (ROXICODONE) 30 MG   Pharm:  [facility] [place], [place]

## 2020-09-19 NOTE — Telephone Encounter (Signed)
Last refill- 08/23/2020--120 tabs no refills Last video visit- 08/18/2020  No future appointment has been scheduled

## 2020-09-19 NOTE — Telephone Encounter (Signed)
Patient notified VIA phone. Dm/cma  

## 2020-09-21 ENCOUNTER — Telehealth: Payer: Self-pay | Admitting: Family Medicine

## 2020-09-21 NOTE — Progress Notes (Signed)
  Chronic Care Management   Outreach Note  09/21/2020 Name: [name] MRN: [id number] DOB: Dec 29, 1940  Referred by: [name], MD Reason for referral : No chief complaint on file.   An unsuccessful telephone outreach was attempted today. The patient was referred to the pharmacist for assistance with care management and care coordination.   Follow Up Plan:   Carley Perdue [employer] Scheduler

## 2020-09-28 ENCOUNTER — Ambulatory Visit: Payer: Medicare HMO | Admitting: Cardiovascular Disease

## 2020-09-28 ENCOUNTER — Ambulatory Visit (INDEPENDENT_AMBULATORY_CARE_PROVIDER_SITE_OTHER): Payer: Medicare HMO

## 2020-09-28 DIAGNOSIS — I495 Sick sinus syndrome: Secondary | ICD-10-CM

## 2020-09-28 LAB — CUP PACEART REMOTE DEVICE CHECK
Battery Remaining Longevity: 130 mo
Battery Voltage: 3 V
Brady Statistic AP VP Percent: 8.32 %
Brady Statistic AP VS Percent: 18.5 %
Brady Statistic AS VP Percent: 14.46 %
Brady Statistic AS VS Percent: 58.72 %
Brady Statistic RA Percent Paced: 26.35 %
Brady Statistic RV Percent Paced: 22.78 %
Date Time Interrogation Session: 20220308235407
Implantable Lead Implant Date: 20180604
Implantable Lead Implant Date: 20180604
Implantable Lead Location: 753859
Implantable Lead Location: 753860
Implantable Lead Model: 5076
Implantable Lead Model: 5076
Implantable Pulse Generator Implant Date: 20180604
Lead Channel Impedance Value: 304 Ohm
Lead Channel Impedance Value: 342 Ohm
Lead Channel Impedance Value: 399 Ohm
Lead Channel Impedance Value: 456 Ohm
Lead Channel Pacing Threshold Amplitude: 0.625 V
Lead Channel Pacing Threshold Amplitude: 0.75 V
Lead Channel Pacing Threshold Pulse Width: 0.4 ms
Lead Channel Pacing Threshold Pulse Width: 0.4 ms
Lead Channel Sensing Intrinsic Amplitude: 2 mV
Lead Channel Sensing Intrinsic Amplitude: 2 mV
Lead Channel Sensing Intrinsic Amplitude: 4.875 mV
Lead Channel Sensing Intrinsic Amplitude: 4.875 mV
Lead Channel Setting Pacing Amplitude: 1.5 V
Lead Channel Setting Pacing Amplitude: 2.5 V
Lead Channel Setting Pacing Pulse Width: 0.4 ms
Lead Channel Setting Sensing Sensitivity: 1.2 mV

## 2020-10-06 NOTE — Progress Notes (Signed)
Remote pacemaker transmission.   

## 2020-11-14 NOTE — Telephone Encounter (Signed)
Error, please disregard.

## 2020-11-15 ENCOUNTER — Telehealth: Payer: Self-pay | Admitting: Family Medicine

## 2020-11-15 DIAGNOSIS — K429 Umbilical hernia without obstruction or gangrene: Secondary | ICD-10-CM

## 2020-11-15 NOTE — Telephone Encounter (Signed)
Spoke with pt verbalized understanding 

## 2020-11-15 NOTE — Telephone Encounter (Signed)
I referred him to Surgery for the umbilical hernia

## 2020-11-15 NOTE — Telephone Encounter (Signed)
Patient calling requesting refill for oxycodone (ROXICODONE) 30 MG immediate release tablet.  Send to: [id number] [place], [place] - [address]  [address] [place], [place] [address]  Phone:  [phone] Fax:  [phone]

## 2020-11-15 NOTE — Telephone Encounter (Signed)
Patient would like to request a referral for hernia. Please advise. [phone].

## 2020-11-15 NOTE — Telephone Encounter (Signed)
I already did the referral a few hours ago

## 2020-11-15 NOTE — Telephone Encounter (Signed)
Spoke with pt scheduled a virtual visit for his pain management for his Oxycodone. Pt is also requesting for a referral for his hernia since the pain seems to get worse

## 2020-11-16 ENCOUNTER — Encounter: Payer: Self-pay | Admitting: Family Medicine

## 2020-11-16 ENCOUNTER — Telehealth (INDEPENDENT_AMBULATORY_CARE_PROVIDER_SITE_OTHER): Payer: Medicare Other | Admitting: Family Medicine

## 2020-11-16 DIAGNOSIS — M5440 Lumbago with sciatica, unspecified side: Secondary | ICD-10-CM

## 2020-11-16 DIAGNOSIS — F1190 Opioid use, unspecified, uncomplicated: Secondary | ICD-10-CM

## 2020-11-16 MED ORDER — OXYCODONE HCL 30 MG PO TABS: 30.0000 mg | ORAL_TABLET | Freq: Four times a day (QID) | ORAL | 0 refills | Status: DC | PRN

## 2020-11-16 MED ORDER — GABAPENTIN 100 MG PO CAPS: ORAL_CAPSULE | ORAL | 5 refills | Status: DC

## 2020-11-16 MED ORDER — LEVOTHYROXINE SODIUM 175 MCG PO TABS: ORAL_TABLET | ORAL | 3 refills | Status: DC

## 2020-11-16 NOTE — Telephone Encounter (Signed)
Referral for general surgery was placed on 11/15/2020.  Patient had video visit on 11/16/2020

## 2020-11-16 NOTE — Progress Notes (Signed)
   Subjective:    Patient ID: [name], male    DOB: Jan 08, 1941, 80 y.o.   MRN: [id number]  HPI Virtual Visit via Telephone Note  I connected with the patient on 11/16/20 at  1:30 PM EDT by telephone and verified that I am speaking with the correct person using two identifiers.   I discussed the limitations, risks, security and privacy concerns of performing an evaluation and management service by telephone and the availability of in person appointments. I also discussed with the patient that there may be a patient responsible charge related to this service. The patient expressed understanding and agreed to proceed.  Location patient: home Location provider: work or home office Participants present for the call: patient, provider Patient did not have a visit in the prior 7 days to address this/these issue(s).   History of Present Illness: Here for pain management. He is doing well.    Observations/Objective: Patient sounds cheerful and well on the phone. I do not appreciate any SOB. Speech and thought processing are grossly intact. Patient reported vitals:  Assessment and Plan: Pain management.  Indication for chronic opioid: low back pain Medication and dose: Oxycodone 30 mg # pills per month: 120 Last UDS date: 08-23-20 Opioid Treatment Agreement signed (Y/N): 08-26-17 Opioid Treatment Agreement last reviewed with patient:  11-16-20 [facility] reviewed this encounter (include red flags): Yes Meds were refilled.  [name], MD   Follow Up Instructions:     [phone] 5-10 [phone] 11-20 9443 21-30 I did not refer this patient for an OV in the next 24 hours for this/these issue(s).  I discussed the assessment and treatment plan with the patient. The patient was provided an opportunity to ask questions and all were answered. The patient agreed with the plan and demonstrated an understanding of the instructions.   The patient was advised to call back or seek an in-person  evaluation if the symptoms worsen or if the condition fails to improve as anticipated.  I provided 17  minutes of non-face-to-face time during this encounter.   [name], MD    Review of Systems     Objective:   Physical Exam        Assessment & Plan:

## 2020-11-21 ENCOUNTER — Ambulatory Visit (INDEPENDENT_AMBULATORY_CARE_PROVIDER_SITE_OTHER): Payer: Medicare Other | Admitting: Family Medicine

## 2020-11-21 ENCOUNTER — Encounter: Payer: Self-pay | Admitting: Family Medicine

## 2020-11-21 ENCOUNTER — Other Ambulatory Visit: Payer: Self-pay

## 2020-11-21 VITALS — BP 128/80 | HR 59 | Temp 97.7°F | Ht 70.0 in | Wt 229.2 lb

## 2020-11-21 DIAGNOSIS — N4889 Other specified disorders of penis: Secondary | ICD-10-CM | POA: Diagnosis not present

## 2020-11-21 MED ORDER — METHYLPREDNISOLONE 4 MG PO TBPK: ORAL_TABLET | ORAL | 0 refills | Status: DC

## 2020-11-21 NOTE — Progress Notes (Signed)
   Subjective:    Patient ID: [name], male    DOB: 1941/04/05, 80 y.o.   MRN: [id number]  HPI Here for 3 days of itching and swelling on the head of the penis. No recent changes in soap or detergent. No visible rashes. He has gotten some relief by applying ice and Benadryl cream.    Review of Systems  Constitutional: Negative.   Respiratory: Negative.   Cardiovascular: Negative.   Genitourinary: Positive for penile swelling.       Objective:   Physical Exam Constitutional:      Appearance: Normal appearance.  Cardiovascular:     Rate and Rhythm: Normal rate and regular rhythm.     Pulses: Normal pulses.     Heart sounds: Normal heart sounds.  Pulmonary:     Effort: Pulmonary effort is normal.     Breath sounds: Normal breath sounds.  Genitourinary:    Comments: The glans and the distal penis are swollen, no rash or vesicles are visible  Neurological:     Mental Status: He is alert.           Assessment & Plan:  Penile swelling of uncertain etiology, we will treat with a Medrol dose pack. Recheck as needed.  [name], MD

## 2020-12-16 ENCOUNTER — Other Ambulatory Visit: Payer: Self-pay | Admitting: Family Medicine

## 2020-12-20 ENCOUNTER — Other Ambulatory Visit: Payer: Self-pay

## 2020-12-20 ENCOUNTER — Encounter (HOSPITAL_BASED_OUTPATIENT_CLINIC_OR_DEPARTMENT_OTHER): Payer: Self-pay | Admitting: *Deleted

## 2020-12-20 ENCOUNTER — Emergency Department (HOSPITAL_BASED_OUTPATIENT_CLINIC_OR_DEPARTMENT_OTHER)
Admission: EM | Admit: 2020-12-20 | Discharge: 2020-12-20 | Disposition: A | Discharge status: Home / Self Care | Payer: Medicare Other | Attending: Emergency Medicine | Admitting: Emergency Medicine

## 2020-12-20 ENCOUNTER — Telehealth: Payer: Self-pay | Admitting: Family Medicine

## 2020-12-20 ENCOUNTER — Emergency Department (HOSPITAL_BASED_OUTPATIENT_CLINIC_OR_DEPARTMENT_OTHER): Payer: Medicare Other

## 2020-12-20 DIAGNOSIS — I4892 Unspecified atrial flutter: Secondary | ICD-10-CM | POA: Insufficient documentation

## 2020-12-20 DIAGNOSIS — R0602 Shortness of breath: Secondary | ICD-10-CM | POA: Diagnosis not present

## 2020-12-20 DIAGNOSIS — R109 Unspecified abdominal pain: Secondary | ICD-10-CM | POA: Diagnosis not present

## 2020-12-20 DIAGNOSIS — I252 Old myocardial infarction: Secondary | ICD-10-CM | POA: Diagnosis not present

## 2020-12-20 DIAGNOSIS — Z7901 Long term (current) use of anticoagulants: Secondary | ICD-10-CM | POA: Insufficient documentation

## 2020-12-20 DIAGNOSIS — I5032 Chronic diastolic (congestive) heart failure: Secondary | ICD-10-CM | POA: Insufficient documentation

## 2020-12-20 DIAGNOSIS — R059 Cough, unspecified: Secondary | ICD-10-CM | POA: Diagnosis not present

## 2020-12-20 DIAGNOSIS — R609 Edema, unspecified: Secondary | ICD-10-CM | POA: Insufficient documentation

## 2020-12-20 DIAGNOSIS — Z87891 Personal history of nicotine dependence: Secondary | ICD-10-CM | POA: Diagnosis not present

## 2020-12-20 DIAGNOSIS — E039 Hypothyroidism, unspecified: Secondary | ICD-10-CM | POA: Insufficient documentation

## 2020-12-20 DIAGNOSIS — Z950 Presence of cardiac pacemaker: Secondary | ICD-10-CM | POA: Insufficient documentation

## 2020-12-20 DIAGNOSIS — I110 Hypertensive heart disease with heart failure: Secondary | ICD-10-CM | POA: Insufficient documentation

## 2020-12-20 DIAGNOSIS — R0600 Dyspnea, unspecified: Secondary | ICD-10-CM

## 2020-12-20 DIAGNOSIS — I2510 Atherosclerotic heart disease of native coronary artery without angina pectoris: Secondary | ICD-10-CM | POA: Insufficient documentation

## 2020-12-20 DIAGNOSIS — R079 Chest pain, unspecified: Secondary | ICD-10-CM | POA: Diagnosis not present

## 2020-12-20 DIAGNOSIS — J9811 Atelectasis: Secondary | ICD-10-CM | POA: Diagnosis not present

## 2020-12-20 DIAGNOSIS — Z79899 Other long term (current) drug therapy: Secondary | ICD-10-CM | POA: Insufficient documentation

## 2020-12-20 LAB — COMPREHENSIVE METABOLIC PANEL
ALT: 16 U/L (ref 0–44)
AST: 21 U/L (ref 15–41)
Albumin: 3.9 g/dL (ref 3.5–5.0)
Alkaline Phosphatase: 55 U/L (ref 38–126)
Anion gap: 10 (ref 5–15)
BUN: 13 mg/dL (ref 8–23)
CO2: 26 mmol/L (ref 22–32)
Calcium: 9.2 mg/dL (ref 8.9–10.3)
Chloride: 99 mmol/L (ref 98–111)
Creatinine, Ser: 1.01 mg/dL (ref 0.61–1.24)
GFR, Estimated: >60 mL/min (ref >60)
Glucose, Bld: 160 mg/dL — ABNORMAL HIGH (ref 70–99)
Potassium: 4.1 mmol/L (ref 3.5–5.1)
Sodium: 135 mmol/L (ref 135–145)
Total Bilirubin: 0.7 mg/dL (ref 0.3–1.2)
Total Protein: 6.8 g/dL (ref 6.5–8.1)

## 2020-12-20 LAB — CBC WITH DIFFERENTIAL/PLATELET
Abs Immature Granulocytes: 0.07 10*3/uL (ref 0.00–0.07)
Basophils Absolute: 0.1 10*3/uL (ref 0.0–0.1)
Basophils Relative: 1 %
Eosinophils Absolute: 0.7 10*3/uL — ABNORMAL HIGH (ref 0.0–0.5)
Eosinophils Relative: 7 %
HCT: 42.1 % (ref 39.0–52.0)
Hemoglobin: 13.1 g/dL (ref 13.0–17.0)
Immature Granulocytes: 1 %
Lymphocytes Relative: 24 %
Lymphs Abs: 2.2 10*3/uL (ref 0.7–4.0)
MCH: 27.5 pg (ref 26.0–34.0)
MCHC: 31.1 g/dL (ref 30.0–36.0)
MCV: 88.3 fL (ref 80.0–100.0)
Monocytes Absolute: 0.8 10*3/uL (ref 0.1–1.0)
Monocytes Relative: 9 %
Neutro Abs: 5.5 10*3/uL (ref 1.7–7.7)
Neutrophils Relative %: 58 %
Platelets: 281 10*3/uL (ref 150–400)
RBC: 4.77 MIL/uL (ref 4.22–5.81)
RDW: 16.6 % — ABNORMAL HIGH (ref 11.5–15.5)
WBC: 9.3 10*3/uL (ref 4.0–10.5)
nRBC: 0 % (ref 0.0–0.2)

## 2020-12-20 LAB — BRAIN NATRIURETIC PEPTIDE: B Natriuretic Peptide: 33.2 pg/mL (ref 0.0–100.0)

## 2020-12-20 LAB — TROPONIN I (HIGH SENSITIVITY)
Troponin I (High Sensitivity): 4 ng/L (ref <18)
Troponin I (High Sensitivity): 4 ng/L (ref <18)

## 2020-12-20 MED ORDER — FUROSEMIDE 20 MG PO TABS: 20.0000 mg | ORAL_TABLET | Freq: Every day | ORAL | 0 refills | Status: DC

## 2020-12-20 MED ORDER — FUROSEMIDE 10 MG/ML IJ SOLN
20.0000 mg | Freq: Once | INTRAMUSCULAR | Status: AC
  Administered 2020-12-20: 20 mg via INTRAVENOUS
  Filled 2020-12-20: qty 2

## 2020-12-20 NOTE — Telephone Encounter (Signed)
Patient is calling and wanted to see if provider can change his date on his oxycodone (ROXICODONE) 30 MG immediate release tablet to be picked up today because he is going out of town, please advise. CB is [phone]   [id number] - [place], [place] - [address]  [address] [place], [place] [address]  Phone:  [phone] Fax:  [phone]
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
## 2020-12-20 NOTE — ED Triage Notes (Signed)
Sob since last night. Slight chest pain 2/10. EKG done at triage.

## 2020-12-20 NOTE — ED Notes (Signed)
Pt. Reports he has been sanding wood and could be causing this condition to be worse and not wearing a mask.

## 2020-12-20 NOTE — ED Provider Notes (Signed)
Care of the patient assumed at the change of shift. Here for dyspnea and orthopnea with chest pressure. He has history of afib with PPM, CAD with stents and mild diastolic HF noncompliant with diuretic because it makes him to go the bathroom frequently. Awaiting labs and reassessment at the change of shift.  Physical Exam  BP 132/85 (BP Location: Right Arm)   Pulse 63   Temp 97.8 F (36.6 C) (Oral)   Resp (!) 22   Ht 5\' 10"  (1.778 m)   Wt 104 kg   SpO2 94%   BMI 32.90 kg/m   Physical Exam Awake and alert Resp even and unlabored, no distress No LE edema ED Course/Procedures   Clinical Course as of 12/20/20 1823  Tue Dec 20, 2020  1547 Labs reviewed, CBC and CMP are unremarkable. First Trop and BNP are neg. CXR is clear. Patient given lasix with good output. He is not hypoxic and no signs of distress, speaking in complete sentences. Will continue to monitor in the ED for urine output and check second Trop.  [CS]  0131 Second Trop remains normal. Will check SpO2, HR, RR with ambulation.  [CS]  4388 Patient able to stand and ambulate without dyspnea or hypoxia. Recommend he continue taking lasix. At this point, no indication for admission. PCP/Cards follow up or RTED if not improving.  [CS]    Clinical Course User Index [CS] Truddie Hidden, MD    Procedures       Truddie Hidden, MD 12/20/20 [phone]

## 2020-12-22 NOTE — Telephone Encounter (Signed)
Spoke with pt state that he picked up his Rx for his pharmacy yesterday. Pt stated that he got an allergic reaction from taking lasix yesterday, broke up into hives and blisters, pt state that he took benadryl and used Calamine lotion to soothe the pain/itch, pt state that the hives have now cleared. Dr Sarajane Jews was notified advised for pt to schedule an office visit. Pt is scheduled to see Dr Sarajane Jews on 12/23/2020

## 2020-12-22 NOTE — Telephone Encounter (Signed)
This does not make sense. He picked up #120 of these yesterday

## 2020-12-22 NOTE — Telephone Encounter (Signed)
Last office visit- 11/21/20

## 2020-12-23 ENCOUNTER — Telehealth (INDEPENDENT_AMBULATORY_CARE_PROVIDER_SITE_OTHER): Payer: Medicare Other | Admitting: Family Medicine

## 2020-12-23 ENCOUNTER — Encounter: Payer: Self-pay | Admitting: Family Medicine

## 2020-12-23 DIAGNOSIS — R600 Localized edema: Secondary | ICD-10-CM | POA: Diagnosis not present

## 2020-12-23 DIAGNOSIS — K429 Umbilical hernia without obstruction or gangrene: Secondary | ICD-10-CM | POA: Diagnosis not present

## 2020-12-23 DIAGNOSIS — T7840XA Allergy, unspecified, initial encounter: Secondary | ICD-10-CM | POA: Diagnosis not present

## 2020-12-23 DIAGNOSIS — R0600 Dyspnea, unspecified: Secondary | ICD-10-CM | POA: Diagnosis not present

## 2020-12-23 MED ORDER — SPIRONOLACTONE 25 MG PO TABS: 25.0000 mg | ORAL_TABLET | Freq: Every day | ORAL | 5 refills | Status: DC

## 2020-12-23 NOTE — Progress Notes (Signed)
   Subjective:    Patient ID: [name], male    DOB: 13-Aug-1940, 80 y.o.   MRN: [id number]  HPI Virtual Visit via Telephone Note  I connected with the patient on 12/23/20 at 10:45 AM EDT by telephone and verified that I am speaking with the correct person using two identifiers.   I discussed the limitations, risks, security and privacy concerns of performing an evaluation and management service by telephone and the availability of in person appointments. I also discussed with the patient that there may be a patient responsible charge related to this service. The patient expressed understanding and agreed to proceed.  Location patient: home Location provider: work or home office Participants present for the call: patient, provider Patient did not have a visit in the prior 7 days to address this/these issue(s).   History of Present Illness: Here to follow up an ED visit on 12-20-20 for fluid overload and to discuss an apparent medication reaction. He presented at the ED with leg swelling and SOB. His labs were normal with a creatinine of 1.01 and a BNP of 33. His CXR showed no congestion. He was given IV Lasix and he diuresed quickly. He felt better and was sent home with Lasox 20 mg to take daily. Of note he had been taking this on and off for years with no problems. Then the next day he took one dose of the Lasix, and he immediately developed an ithcy rash all over his body. No tongue swelling or SOB. He took 2 Benadryl tablets and this resolved. He has felt fine since then but he is afraid to take any more Lasix.    Observations/Objective: Patient sounds cheerful and well on the phone. I do not appreciate any SOB. Speech and thought processing are grossly intact. Patient reported vitals:  Assessment and Plan: First of all, he had fluid overload so he will need to take some type of diuretic. Second, he had an allergic reaction to Fursoemide, which has now resolved. We will stay away  from loop diuretics and he will try Spironolactone 25 mg daily. Recheck in the office in one month. We spent 30 minutes reviewing records and discuss these issues.  [name], MD   Follow Up Instructions:     [id number] 5-10 [phone] 11-20 9443 21-30 I did not refer this patient for an OV in the next 24 hours for this/these issue(s).  I discussed the assessment and treatment plan with the patient. The patient was provided an opportunity to ask questions and all were answered. The patient agreed with the plan and demonstrated an understanding of the instructions.   The patient was advised to call back or seek an in-person evaluation if the symptoms worsen or if the condition fails to improve as anticipated.  I provided 30 minutes of non-face-to-face time during this encounter.   [name], MD   Review of Systems     Objective:   Physical Exam        Assessment & Plan:

## 2020-12-25 ENCOUNTER — Encounter (HOSPITAL_BASED_OUTPATIENT_CLINIC_OR_DEPARTMENT_OTHER): Payer: Self-pay | Admitting: Emergency Medicine

## 2020-12-25 ENCOUNTER — Telehealth: Payer: Self-pay | Admitting: Family Medicine

## 2020-12-25 ENCOUNTER — Emergency Department (HOSPITAL_BASED_OUTPATIENT_CLINIC_OR_DEPARTMENT_OTHER): Payer: Medicare Other

## 2020-12-25 ENCOUNTER — Emergency Department (HOSPITAL_BASED_OUTPATIENT_CLINIC_OR_DEPARTMENT_OTHER)
Admission: EM | Admit: 2020-12-25 | Discharge: 2020-12-25 | Disposition: A | Discharge status: Home / Self Care | Payer: Medicare Other | Attending: Emergency Medicine | Admitting: Emergency Medicine

## 2020-12-25 ENCOUNTER — Other Ambulatory Visit: Payer: Self-pay

## 2020-12-25 DIAGNOSIS — Z950 Presence of cardiac pacemaker: Secondary | ICD-10-CM | POA: Diagnosis not present

## 2020-12-25 DIAGNOSIS — I5042 Chronic combined systolic (congestive) and diastolic (congestive) heart failure: Secondary | ICD-10-CM | POA: Insufficient documentation

## 2020-12-25 DIAGNOSIS — Z87891 Personal history of nicotine dependence: Secondary | ICD-10-CM | POA: Diagnosis not present

## 2020-12-25 DIAGNOSIS — I110 Hypertensive heart disease with heart failure: Secondary | ICD-10-CM | POA: Diagnosis not present

## 2020-12-25 DIAGNOSIS — I2510 Atherosclerotic heart disease of native coronary artery without angina pectoris: Secondary | ICD-10-CM | POA: Insufficient documentation

## 2020-12-25 DIAGNOSIS — R0602 Shortness of breath: Secondary | ICD-10-CM | POA: Diagnosis not present

## 2020-12-25 DIAGNOSIS — Z79899 Other long term (current) drug therapy: Secondary | ICD-10-CM | POA: Diagnosis not present

## 2020-12-25 DIAGNOSIS — I5023 Acute on chronic systolic (congestive) heart failure: Secondary | ICD-10-CM | POA: Diagnosis not present

## 2020-12-25 DIAGNOSIS — Z7901 Long term (current) use of anticoagulants: Secondary | ICD-10-CM | POA: Insufficient documentation

## 2020-12-25 DIAGNOSIS — E039 Hypothyroidism, unspecified: Secondary | ICD-10-CM | POA: Insufficient documentation
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
## 2020-12-25 NOTE — ED Notes (Signed)
Pt refused labs.  

## 2020-12-25 NOTE — Telephone Encounter (Signed)
On call note.  SOB with laying down.  Not improved with spironolactone.  Concern for fluid retention.  Recent lasix concern noted.  Advised in person eval today.  He understands, will go to [facility] .

## 2020-12-25 NOTE — ED Triage Notes (Signed)
Pt seen here on 12/20/2020 for shortness of breath. Pt reports prescribed medication but medication is not helping.

## 2020-12-25 NOTE — ED Notes (Signed)
On a five-lead

## 2020-12-25 NOTE — ED Notes (Signed)
Patient to xray.

## 2020-12-25 NOTE — Discharge Instructions (Signed)
Please call your primary care doctor tomorrow, to request further managements of your congestive heart failure.  Continue to take your spironolactone as previously prescribed.  Return if you have any concern.

## 2020-12-26 ENCOUNTER — Telehealth: Payer: Self-pay | Admitting: Family Medicine

## 2020-12-26 ENCOUNTER — Encounter: Payer: Self-pay | Admitting: Family Medicine

## 2020-12-26 ENCOUNTER — Telehealth (INDEPENDENT_AMBULATORY_CARE_PROVIDER_SITE_OTHER): Payer: Medicare Other | Admitting: Family Medicine

## 2020-12-26 VITALS — Wt 229.0 lb

## 2020-12-26 DIAGNOSIS — R0602 Shortness of breath: Secondary | ICD-10-CM

## 2020-12-26 DIAGNOSIS — I2510 Atherosclerotic heart disease of native coronary artery without angina pectoris: Secondary | ICD-10-CM | POA: Diagnosis not present

## 2020-12-26 DIAGNOSIS — I10 Essential (primary) hypertension: Secondary | ICD-10-CM | POA: Diagnosis not present

## 2020-12-26 DIAGNOSIS — E039 Hypothyroidism, unspecified: Secondary | ICD-10-CM

## 2020-12-26 NOTE — Telephone Encounter (Signed)
Pt has a lab appointment at the office tomorrow, also pt has been schedule with cardiology tomorrow at 10.20 am, this should be reviewed at the visit

## 2020-12-26 NOTE — Progress Notes (Signed)
   Subjective:    Patient ID: [name], male    DOB: 09/29/1940, 80 y.o.   MRN: [id number]  HPI Virtual Visit via Telephone Note  I connected with the patient on 12/26/20 at  2:30 PM EDT by telephone and verified that I am speaking with the correct person using two identifiers.   I discussed the limitations, risks, security and privacy concerns of performing an evaluation and management service by telephone and the availability of in person appointments. I also discussed with the patient that there may be a patient responsible charge related to this service. The patient expressed understanding and agreed to proceed.  Location patient: home Location provider: work or home office Participants present for the call: patient, provider Patient did not have a visit in the prior 7 days to address this/these issue(s).   History of Present Illness: Here to follow up another ED visit yesterday for SOB. He had ben seen in the ED on 12-20-20 for this and was found to have a fluid overload. His BNP that day was normal at 33, and the creatinine was normal at 1.10.  He was given IV Lasix and he responded with a quick diuresis. He was given oral Lasix to take and after the first dose he developed a rash which was assumed to represent an allergic reaction. The Lasix was stopped and he was started on Spironolactone 25 mg daily. His BP has been stable, but he quickly developed more SOB. This is more evident when lying down, so he is sleeping in a recliner. He went back to the ED yesterday and some rales were heard on exam. However a CXR revealed no pulmonary fluid at all. They were afraid to give him Lasix so he was sent home and told to follow up with [place]. Today he feels weak and again is SOB when lying flat. No chest pain. His BP is stable in the 130s over 90s. His oximeter is broken.    Observations/Objective: Patient sounds cheerful and well on the phone. I do not appreciate any SOB. Speech and thought  processing are grossly intact. Patient reported vitals:  Assessment and Plan: He has SOB which sounds like CHF but yet his recent CXR did not show fluid overload. We are avoiding loop diuretics due to concern about a  possible allergy. He will come by our lab tomorrow to have a thyroid panel checked. I asked him to get another oximeter from his pharmacy to follow this at home. We will arrange for him to see Cardiology asap to evaluate further. We spent 35 minutes today reviewing his records and discussing these issues.  [name], MD    Follow Up Instructions:     [phone] 11-20 [id number] 21-30 I did not refer this patient for an OV in the next 24 hours for this/these issue(s).  I discussed the assessment and treatment plan with the patient. The patient was provided an opportunity to ask questions and all were answered. The patient agreed with the plan and demonstrated an understanding of the instructions.   The patient was advised to call back or seek an in-person evaluation if the symptoms worsen or if the condition fails to improve as anticipated.  I provided 35 minutes of non-face-to-face time during this encounter.   [name], MD    Review of Systems     Objective:   Physical Exam        Assessment & Plan:

## 2020-12-26 NOTE — Telephone Encounter (Signed)
Pt is calling in stating that he forgot to ask if Dr. Sarajane Jews is going to call in a different medication for retention of fluid and to see if Dr. Sarajane Jews will call in his TSH medication.   Pharm:  [facility] in [place]

## 2020-12-27 ENCOUNTER — Telehealth: Payer: Self-pay | Admitting: Cardiovascular Disease

## 2020-12-27 ENCOUNTER — Other Ambulatory Visit (INDEPENDENT_AMBULATORY_CARE_PROVIDER_SITE_OTHER): Payer: Medicare Other

## 2020-12-27 ENCOUNTER — Encounter: Payer: Self-pay | Admitting: Cardiovascular Disease

## 2020-12-27 ENCOUNTER — Ambulatory Visit: Payer: Medicare Other | Admitting: Cardiovascular Disease

## 2020-12-27 ENCOUNTER — Other Ambulatory Visit: Payer: Self-pay

## 2020-12-27 VITALS — BP 118/68 | HR 64 | Ht 70.0 in | Wt 228.8 lb

## 2020-12-27 DIAGNOSIS — I5032 Chronic diastolic (congestive) heart failure: Secondary | ICD-10-CM

## 2020-12-27 DIAGNOSIS — I2510 Atherosclerotic heart disease of native coronary artery without angina pectoris: Secondary | ICD-10-CM

## 2020-12-27 DIAGNOSIS — E039 Hypothyroidism, unspecified: Secondary | ICD-10-CM | POA: Diagnosis not present

## 2020-12-27 DIAGNOSIS — I495 Sick sinus syndrome: Secondary | ICD-10-CM

## 2020-12-27 DIAGNOSIS — I4892 Unspecified atrial flutter: Secondary | ICD-10-CM

## 2020-12-27 LAB — T3, FREE: T3, Free: 2.7 pg/mL (ref 2.3–4.2)

## 2020-12-27 LAB — TSH: TSH: 15.16 u[IU]/mL — ABNORMAL HIGH (ref 0.35–4.50)

## 2020-12-27 LAB — T4, FREE: Free T4: 0.65 ng/dL (ref 0.60–1.60)

## 2020-12-27 MED ORDER — TORSEMIDE 20 MG PO TABS: 20.0000 mg | ORAL_TABLET | Freq: Every day | ORAL | 3 refills | Status: DC

## 2020-12-27 NOTE — Progress Notes (Signed)
Chief Complaint  Patient presents with  . Follow-up    History of Present Illness: 80 yo male with history of CAD, HTN, HLD, sleep apnea, sinus node dysfunction s/p pacemaker, atrial flutter here today for follow up. Cardiac caths in 2009 and 2014 with moderate non-obstructive CAD. He has not tolerated statins. He was admitted to [facility] April 2016 with unstable angina. Cardiac cath with severe disease in the LAD and RCA, both treated with drug eluting stents. Repeat cath May 2016 due to dyspnea with stable disease. Symptoms resolved off of Brilinta. Cardiac monitor June 2016 with PACs, PVCs. He has had severe anemia due to angiodysplasia. Cardiac monitor May 2017 with nocturnal bradycardia with rates as low as 22 bpm with blocked PACs. Echo June 2017 with normal LV systolic function, grade 1 diastolic dysfunction, moderate LVH. He was seen in the EP clinic by Dr. Lennie Odor 12/30/15 and sleep study was arranged but he did not keep this appt. He did not tolerate Norvasc due to LE edema and did not tolerate beta blockers due to bradycardia. I saw him 12/03/16 and he c/o episodes of dizziness but no palpitations. I arranged a cardiac monitor which showed atrial fib/flutter and long pauses. Xarelto was started. Plavix was stopped. He was seen by EP and a pacemaker was implanted on 12/24/16. Synthroid increased due to high TSH. Following his pacemaker implantation, he has c/o dry cough and he was treated with a round of antibiotics by primary care. He also had c/o chest pain and dizziness. He was seen in our office 01/08/17 by Melina Copa, PA-C and based on is complaints, he was admitted to [facility] from our office. Echo 01/08/17 with normal LV size and function and no valve issues. Nuclear stress test showed a large apical/anteroapical and inferoapical scar but no ischemia. D-dimer negative. BNP was normal. Troponin negative. I saw him in my office 01/14/17 and he felt terrible with c/o weakness, fatigue, aching in legs,  rare chest pains. TSH was elevated in June and Synthroid was increased. He was seen in primary care 01/16/17 and no new recommendations given. Carotid dopplers June 2017 with mild bllateral disease. Nuclear stress test July 2020 with no ischemia. Echo July 2020 with LVEF=60-65%, no valve disease. I saw him by video visit January 2021 and he was doing well. He had stopped his Eliquis due to insurance issues but we restarted at that visit. He has since stopped the Eliquis due to cost. Last seen in my office in February 2022 and doing well overall with chronic dyspnea.   He was due for primary care follow up yesterday by virtual visit but our office was called by Dr. Barbie Banner office when the patient called in with complaints. He was added onto my schedule today and has an appt in primary care later today. He has had worsened dyspnea and orthopnea. Seen in the ED 12/20/20 with c/o dyspnea and orthopnea. Reported not taking Lasix because it made him urinate too much. BNP was normal. Troponin was normal. Chest x-ray and EKG ok. Was given IV Lasix in the ED and felt better. He was seen in the ED again 12/25/20 after reporting a reaction with hives after taking oral Lasix at home. He was started on aldactone but this has not increased his urination. Chest x-ray again clear. No labs on 12/25/20.   The patient denies any chest pain, palpitations, lower extremity edema, dizziness, near syncope or syncope. He continues to have dyspnea and orthopnea. He has only  taken the aldactone over the past two days. He has not seen an increase in urinary frequency.   Primary Care Physician: [name], MD  Past Medical History:  Diagnosis Date  . Adenomatous polyp of colon 2007  . Arthritis   . Atrial flutter ([place])   . AVM (arteriovenous malformation) 2011   a. S/p argon plasma coagulation and ablation in 2011, 2017.  . Bradycardia    a. H/o almost 7sec pause nocturnally during 2011 admission. Also has h/o fatigue with BB.  [name]  CAD (coronary artery disease)    a. Nonobst disease by cath 2009. b. s/p PCI 10/2014 with DES to [place], patent by relook 11/2014 (Brilinta changed to Plavix with improved sx).  . Chronic diastolic CHF (congestive heart failure) ([place])   . Depression   . Diverticulosis   . Gastritis 2011  . GERD (gastroesophageal reflux disease)   . History of hiatal hernia   . Hyperlipidemia   . Hypertension   . Hypothyroidism   . Myocardial infarction ([place])   . Obstructive sleep apnea    -no cpap use  . Premature atrial contractions Holter 2016  . PVC's (premature ventricular contractions) Holter 2016  . Statin intolerance   . Transfusion history    several years ago -GI bleed    Past Surgical History:  Procedure Laterality Date  . ANKLE SURGERY Left   . APPENDECTOMY    . CARDIAC CATHETERIZATION N/A 11/26/2014   Procedure: Left Heart Cath and Coronary Angiography;  Surgeon: Burnell Blanks, MD;  Location: [place] [facility];  Service: Cardiovascular;  Laterality: N/A;  . CHOLECYSTECTOMY N/A 08/23/2016   Procedure: LAPAROSCOPIC CHOLECYSTECTOMY;  Surgeon: Coralie Keens, MD;  Location: [place];  Service: General;  Laterality: N/A;  . COLONOSCOPY  08-23-05   per Dr. Deatra Ina, adenomatous polyps, repeat in 5 yrs   . COLONOSCOPY WITH PROPOFOL N/A 12/06/2015   Procedure: COLONOSCOPY WITH PROPOFOL;  Surgeon: Doran Stabler, MD;  Location: [facility];  Service: Gastroenterology;  Laterality: N/A;  . ENTEROSCOPY N/A 12/06/2015   Procedure: ENTEROSCOPY;  Surgeon: Doran Stabler, MD;  Location: [facility];  Service: Gastroenterology;  Laterality: N/A;  . ESOPHAGOGASTRODUODENOSCOPY  08-23-05   per Dr. Deatra Ina, cauterized jejunal AVMs   . HERNIA REPAIR    . HOT HEMOSTASIS N/A 12/06/2015   Procedure: HOT HEMOSTASIS (ARGON PLASMA COAGULATION/BICAP);  Surgeon: Doran Stabler, MD;  Location: [facility];  Service: Gastroenterology;  Laterality: N/A;  . LEFT HEART CATHETERIZATION WITH CORONARY  ANGIOGRAM N/A 09/08/2012   Procedure: LEFT HEART CATHETERIZATION WITH CORONARY ANGIOGRAM;  Surgeon: Burnell Blanks, MD;  Location: [facility] C[facility] LAB;  Service: Cardiovascular;  Laterality: N/A;  . LEFT HEART CATHETERIZATION WITH CORONARY ANGIOGRAM N/A 11/16/2014   Procedure: LEFT HEART CATHETERIZATION WITH CORONARY ANGIOGRAM;  Surgeon: Leonie Man, MD;  Location: [facility] CATH LAB;  Service: Cardiovascular;  Laterality: N/A;  . PACEMAKER IMPLANT N/A 12/24/2016   Procedure: Pacemaker Implant;  Surgeon: Deboraha Sprang, MD;  Location: [place] [facility];  Service: Cardiovascular;  Laterality: N/A;  . SKIN GRAFT Right    leg  . TONSILLECTOMY      Current Outpatient Medications  Medication Sig Dispense Refill  . apixaban (ELIQUIS) 5 MG TABS tablet Take 1 tablet (5 mg total) by mouth 2 (two) times daily. 180 tablet 3  . Ascorbic Acid (VITAMIN C PO) Take 4 tablets by mouth daily as needed (flu like symptopms).     . diclofenac  sodium (VOLTAREN) 1 % GEL Apply 1 application topically daily as needed (ankle pain). 5 Tube 10  . diltiazem (CARDIZEM CD) 120 MG 24 hr capsule Take 1 capsule (120 mg total) by mouth daily. 90 capsule 2  . fluticasone (FLONASE) 50 MCG/ACT nasal spray Place 1 spray into both nostrils daily as needed for allergies or rhinitis. 48 g 3  . gabapentin (NEURONTIN) 100 MG capsule TAKE 1 CAPSULE BY MOUTH THREE TIMES A DAY 90 capsule 5  . levothyroxine (SYNTHROID) 175 MCG tablet TAKE 1 TABLET BY MOUTH EVERY DAY BEFORE BREAKFAST 90 tablet 3  . nitroGLYCERIN (NITROSTAT) 0.4 MG SL tablet Place 1 tablet (0.4 mg total) under the tongue every 5 (five) minutes as needed for chest pain. 25 tablet 3  . omeprazole (PRILOSEC) 40 MG capsule Take 1 capsule (40 mg total) by mouth daily. 90 capsule 3  . [START ON 01/20/2021] oxycodone (ROXICODONE) 30 MG immediate release tablet Take 1 tablet (30 mg total) by mouth every 6 (six) hours as needed for pain. 120 tablet 0  . ropinirole (REQUIP) 5 MG tablet Take 1  tablet (5 mg total) by mouth in the morning, at noon, in the evening, and at bedtime. 360 tablet 3  . torsemide (DEMADEX) 20 MG tablet Take 1 tablet (20 mg total) by mouth daily. 90 tablet 3   No current facility-administered medications for this visit.    Allergies  Allergen Reactions  . Brilinta [Ticagrelor] Shortness Of Breath  . Colchicine     Syncope-causes patient to pass out  . Metoprolol Tartrate Other (See Comments)    Severe chest pains " flat lined patient"  . Mirapex [Pramipexole Dihydrochloride]     Severe leg pain  . Shellfish Allergy Anaphylaxis and Hives  . Statins Other (See Comments)    All statins cause myalgias   . Zolpidem Other (See Comments)    Chest pain  . Coconut Oil Hives  . Lasix [Furosemide] Hives and Itching  . Levaquin [Levofloxacin] Hives  . Trazodone And Nefazodone Other (See Comments)    Unsteady on feet    Social History   Socioeconomic History  . Marital status: Widowed    Spouse name: Not on file  . Number of children: 1  . Years of education: Not on file  . Highest education level: Not on file  Occupational History  . Occupation: Disabled    Employer: UNEMPLOYED  Tobacco Use  . Smoking status: Former Smoker    Packs/day: 2.00    Years: 50.00    Pack years: 100.00    Types: Cigarettes    Quit date: 07/23/2002    Years since quitting: 18.4  . Smokeless tobacco: Never Used  Vaping Use  . Vaping Use: Never used  Substance and Sexual Activity  . Alcohol use: No    Alcohol/week: 0.0 standard drinks  . Drug use: No  . Sexual activity: Not on file  Other Topics Concern  . Not on file  Social History Narrative  . Not on file   Social Determinants of Health   Financial Resource Strain: Medium Risk  . Difficulty of Paying Living Expenses: Somewhat hard  Food Insecurity: No Food Insecurity  . Worried About Charity fundraiser in the Last Year: Never true  . Ran Out of Food in the Last Year: Never true  Transportation Needs: No  Transportation Needs  . Lack of Transportation (Medical): No  . Lack of Transportation (Non-Medical): No  Physical Activity: Inactive  . Days of  Exercise per Week: 0 days  . Minutes of Exercise per Session: 0 min  Stress: No Stress Concern Present  . Feeling of Stress : Not at all  Social Connections: Moderately Isolated  . Frequency of Communication with Friends and Family: More than three times a week  . Frequency of Social Gatherings with Friends and Family: More than three times a week  . Attends Religious Services: More than 4 times per year  . Active Member of Clubs or Organizations: No  . Attends Archivist Meetings: Never  . Marital Status: Widowed  Intimate Partner Violence: Not At Risk  . Fear of Current or Ex-Partner: No  . Emotionally Abused: No  . Physically Abused: No  . Sexually Abused: No    Family History  Problem Relation Age of Onset  . Leukemia Mother   . Heart disease Father   . Stroke Father   . Heart attack Father   . Alcoholism Paternal Uncle   . Alcoholism Maternal Grandfather   . Colon cancer Neg Hx   . Esophageal cancer Neg Hx     Review of Systems:  As stated in the HPI and otherwise negative.   BP 118/68   Pulse 64   Ht 5\' 10"  (1.778 m)   Wt 228 lb 12.8 oz (103.8 kg)   SpO2 97%   BMI 32.83 kg/m   Physical Examination: General: Well developed, well nourished, NAD  HEENT: OP clear, mucus membranes moist  SKIN: warm, dry. No rashes. Neuro: No focal deficits  Musculoskeletal: Muscle strength 5/5 all ext  Psychiatric: Mood and affect normal  Neck: No JVD, no carotid bruits, no thyromegaly, no lymphadenopathy.  Lungs:Clear bilaterally, no wheezes, rhonci, crackles Cardiovascular: Regular rate and rhythm. No murmurs, gallops or rubs. Abdomen:Soft. Bowel sounds present. Non-tender.  Extremities: No lower extremity edema.   Echo 01/08/17: Left ventricle: The cavity size was normal. Wall thickness was   increased in a pattern of  mild LVH. Systolic function was normal.   The estimated ejection fraction was in the range of 60% to 65%.   Wall motion was normal; there were no regional wall motion   abnormalities. Doppler parameters are consistent with abnormal   left ventricular relaxation (grade 1 diastolic dysfunction). - Aortic valve: Transvalvular velocity was within the normal range.   There was no stenosis. There was no regurgitation. - Mitral valve: Mildly calcified annulus. Transvalvular velocity   was within the normal range. There was no evidence for stenosis.   There was no regurgitation. - Left atrium: The atrium was mildly dilated. - Right ventricle: The cavity size was normal. Wall thickness was   normal. Systolic function was normal. - Atrial septum: No defect or patent foramen ovale was identified. - Tricuspid valve: There was trivial regurgitation.  Cardiac cath 11/26/14: Left Anterior Descending   . Ost LAD to Prox LAD lesion, 50% stenosed. discrete . The lesion was not previously treated.   . Prox LAD to Mid LAD lesion, 0% stenosed. Previously placed Prox LAD to Mid LAD stent (unknown type) is patent.   Jorene Minors LAD lesion, 25% stenosed.      Left Circumflex   . Mid Cx lesion, 30% stenosed.     Right Coronary Artery   . Prox RCA lesion, 20% stenosed.   . Mid RCA lesion, 40% stenosed.   . Mid RCA to Dist RCA lesion, 30% stenosed.   . Right Posterior Descending Artery   . RPDA lesion, 0% stenosed. Previously placed RPDA  drug eluting stent is patent.     EKG:  EKG is not  ordered today. The ekg ordered today demonstrates    Recent Labs: 12/20/2020: ALT 16; B Natriuretic Peptide 33.2; BUN 13; Creatinine, Ser 1.01; Hemoglobin 13.1; Platelets 281; Potassium 4.1; Sodium 135   Lipid Panel    Component Value Date/Time   CHOL 148 12/11/2017 0837   TRIG 113 12/11/2017 0837   HDL 38 (L) 12/11/2017 0837   CHOLHDL 3.9 12/11/2017 0837   CHOLHDL 4.6 03/22/2016 0935   VLDL 46 (H) 03/22/2016 0935    LDLCALC 87 12/11/2017 0837     Wt Readings from Last 3 Encounters:  12/27/20 228 lb 12.8 oz (103.8 kg)  12/26/20 229 lb (103.9 kg)  12/25/20 229 lb 4.5 oz (104 kg)     Other studies Reviewed: Additional studies/ records that were reviewed today include: . Review of the above records demonstrates:    Assessment and Plan:   1. CAD with stable angina:No chest pain. Nuclear stress test without ischemia July 2020. He has been off of ASA since restarting Eliquis. He is statin intolerant. He stopped the Zetia. He does not wish to restart. He refuses to take beta blockers.   2. Chronic diastolic CHF: He has no evidence of volume overload. Weight stable from February 2022. Lungs overall clear on exam with bibasilar atelectasis. BNP normal last week. Chest x-ray normal twice over last week. He did feel better after being treated with IV Lasix in the ED 12/20/20 but then had a potential reaction to po Lasix at home. It is not clear why he would react to a medication that he has been taking for years. I wonder if this was truly a reaction to Lasix. Will stop aldactone which has not been effective for him and start Torsemide 20 mg daily. Reviewed with our pharmacy team and should be safe to try. Will reassess in one week.   3. Hyperlipidemia:Intolerant of statins. He does not wish to take Zetia. He does not wish to be referred to the lipid clinic for any other therapies.   4. Sinus node dysfunction:s/p pacemaker.Followed by Dr. [name].   5. Atrial flutter, paroxysmal:Sinus today on exam. He is followed by Dr. [name]. Continue Cardizem and Eliquis.   Current medicines are reviewed at length with the patient today.  The patient does not have concerns regarding medicines.  The following changes have been made:  no change  Labs/ tests ordered today include:   No orders of the defined types were placed in this encounter.  Disposition:   FU with me in 12 months    Signed, [name], MD 12/27/2020 11:06 AM    [facility] [address], [place], [place]  [address] Phone: [phone]; Fax: [phone]

## 2020-12-27 NOTE — Patient Instructions (Addendum)
Medication Instructions:  Your physician has recommended you make the following change in your medication:  1.) stop spironolatone 2.) start torsemide (Demadex) 20 mg - take one tablet daily  *If you need a refill on your cardiac medications before your next appointment, please call your pharmacy*   Lab Work: none If you have labs (blood work) drawn today and your tests are completely normal, you will receive your results only by: [name] MyChart Message (if you have MyChart) OR . A paper copy in the mail If you have any lab test that is abnormal or we need to change your treatment, we will call you to review the results.   Testing/Procedures: none   Follow-Up: See below  Other Instructions

## 2020-12-27 NOTE — Telephone Encounter (Signed)
Patient called to say that the medication torsemide (DEMADEX) 20 MG tablet is not doing anything for him. Wanting to know if there is something else he can take. Please advise

## 2020-12-27 NOTE — Telephone Encounter (Signed)
Spoke with patient about new medication, torsemide, that he received an order for today.  Patient stated it wasn't working for him and I advised him to give the medication another day or two for results.  Advised him to call back if it doesn't see improvement and I would forward this to this nurse and provider.  Patient appreciative of return call.

## 2020-12-28 ENCOUNTER — Ambulatory Visit (INDEPENDENT_AMBULATORY_CARE_PROVIDER_SITE_OTHER): Payer: Medicare HMO

## 2020-12-28 ENCOUNTER — Other Ambulatory Visit: Payer: Self-pay

## 2020-12-28 DIAGNOSIS — E039 Hypothyroidism, unspecified: Secondary | ICD-10-CM

## 2020-12-28 DIAGNOSIS — I4892 Unspecified atrial flutter: Secondary | ICD-10-CM

## 2020-12-28 MED ORDER — LEVOTHYROXINE SODIUM 200 MCG PO TABS: 200.0000 ug | ORAL_TABLET | Freq: Every day | ORAL | 3 refills | Status: DC

## 2020-12-28 NOTE — Telephone Encounter (Signed)
Patient called back for lab results.

## 2020-12-29 ENCOUNTER — Telehealth: Payer: Self-pay

## 2020-12-29 LAB — CUP PACEART REMOTE DEVICE CHECK
Battery Remaining Longevity: 126 mo
Battery Voltage: 2.99 V
Brady Statistic AP VP Percent: 10.67 %
Brady Statistic AP VS Percent: 21.25 %
Brady Statistic AS VP Percent: 15.82 %
Brady Statistic AS VS Percent: 52.26 %
Brady Statistic RA Percent Paced: 31.77 %
Brady Statistic RV Percent Paced: 26.49 %
Date Time Interrogation Session: 20220609142436
Implantable Lead Implant Date: 20180604
Implantable Lead Implant Date: 20180604
Implantable Lead Location: 753859
Implantable Lead Location: 753860
Implantable Lead Model: 5076
Implantable Lead Model: 5076
Implantable Pulse Generator Implant Date: 20180604
Lead Channel Impedance Value: 323 Ohm
Lead Channel Impedance Value: 380 Ohm
Lead Channel Impedance Value: 418 Ohm
Lead Channel Impedance Value: 418 Ohm
Lead Channel Pacing Threshold Amplitude: 0.625 V
Lead Channel Pacing Threshold Amplitude: 0.875 V
Lead Channel Pacing Threshold Pulse Width: 0.4 ms
Lead Channel Pacing Threshold Pulse Width: 0.4 ms
Lead Channel Sensing Intrinsic Amplitude: 1.875 mV
Lead Channel Sensing Intrinsic Amplitude: 1.875 mV
Lead Channel Sensing Intrinsic Amplitude: 5.125 mV
Lead Channel Sensing Intrinsic Amplitude: 5.125 mV
Lead Channel Setting Pacing Amplitude: 1.5 V
Lead Channel Setting Pacing Amplitude: 2.5 V
Lead Channel Setting Pacing Pulse Width: 0.4 ms
Lead Channel Setting Sensing Sensitivity: 1.2 mV

## 2020-12-29 NOTE — Telephone Encounter (Signed)
Spoke with aware of Dr Sarajane Jews advise

## 2021-01-04 ENCOUNTER — Telehealth (INDEPENDENT_AMBULATORY_CARE_PROVIDER_SITE_OTHER): Payer: Medicare Other | Admitting: Family Medicine

## 2021-01-04 ENCOUNTER — Encounter: Payer: Self-pay | Admitting: Family Medicine

## 2021-01-04 VITALS — Temp 97.6°F | Wt 228.0 lb

## 2021-01-04 DIAGNOSIS — J40 Bronchitis, not specified as acute or chronic: Secondary | ICD-10-CM

## 2021-01-04 MED ORDER — AMOXICILLIN-POT CLAVULANATE 875-125 MG PO TABS: 1.0000 | ORAL_TABLET | Freq: Two times a day (BID) | ORAL | 0 refills | Status: DC

## 2021-01-04 MED ORDER — HYDROCODONE BIT-HOMATROP MBR 5-1.5 MG/5ML PO SOLN: 5.0000 mL | ORAL | 0 refills | Status: DC | PRN

## 2021-01-04 MED ORDER — FLUTICASONE PROPIONATE 50 MCG/ACT NA SUSP: 1.0000 | Freq: Every day | NASAL | 11 refills | Status: DC | PRN

## 2021-01-04 NOTE — Progress Notes (Signed)
   Subjective:    Patient ID: [name], male    DOB: 09-Dec-1940, 80 y.o.   MRN: [id number]  HPI Virtual Visit via Telephone Note  I connected with the patient on 01/04/21 at  4:15 PM EDT by telephone and verified that I am speaking with the correct person using two identifiers.   I discussed the limitations, risks, security and privacy concerns of performing an evaluation and management service by telephone and the availability of in person appointments. I also discussed with the patient that there may be a patient responsible charge related to this service. The patient expressed understanding and agreed to proceed.  Location patient: home Location provider: work or home office Participants present for the call: patient, provider Patient did not have a visit in the prior 7 days to address this/these issue(s).   History of Present Illness: Here for 5 days of chest tightness and a dry cough. No fever or SOB. No body aches or NVD. He is drinking fluids.    Observations/Objective: Patient sounds cheerful and well on the phone. I do not appreciate any SOB. Speech and thought processing are grossly intact. Patient reported vitals:  Assessment and Plan: Bronchitis, treat with Augmentin. He can use Hycodan as needed for the cough. I encouraged him to get tested for the Covid virus, and he agreed.  [name], MD   Follow Up Instructions:     [phone] 5-10 [phone] 11-20 [id number] 21-30 I did not refer this patient for an OV in the next 24 hours for this/these issue(s).  I discussed the assessment and treatment plan with the patient. The patient was provided an opportunity to ask questions and all were answered. The patient agreed with the plan and demonstrated an understanding of the instructions.   The patient was advised to call back or seek an in-person evaluation if the symptoms worsen or if the condition fails to improve as anticipated.  I provided 16 minutes of  non-face-to-face time during this encounter.   [name], MD     Review of Systems     Objective:   Physical Exam        Assessment & Plan:

## 2021-01-05 ENCOUNTER — Telehealth: Payer: Self-pay | Admitting: Family Medicine

## 2021-01-05 NOTE — Telephone Encounter (Signed)
The patient had a virtual visit with Dr. Sarajane Jews 06/15 and was advised to have a COVID test done. He contacted the office after hours and let them know that his COVID test is positive.  He stated that he has a hard cough, sore throat, chest pain, and rib pain from coughing and SOB.

## 2021-01-05 NOTE — Telephone Encounter (Signed)
Please advise 

## 2021-01-06 MED ORDER — NIRMATRELVIR/RITONAVIR (PAXLOVID)TABLET: 3.0000 | ORAL_TABLET | Freq: Two times a day (BID) | ORAL | 0 refills | Status: AC

## 2021-01-06 NOTE — Telephone Encounter (Signed)
Spoke with pt state that he is feeling a little better, advised to pick up Rx sent today from his pharmacy

## 2021-01-06 NOTE — Telephone Encounter (Signed)
I already sent in Augmentin and a cough syrup yesterday. For the Covid infection, I just sent in Paxlovid for him to take for 5 days

## 2021-01-06 NOTE — Addendum Note (Signed)
Addended by: [name] A on: 01/06/2021 08:27 AM   Modules accepted: Orders

## 2021-01-12 ENCOUNTER — Other Ambulatory Visit: Payer: Self-pay | Admitting: General Surgery

## 2021-01-12 ENCOUNTER — Ambulatory Visit: Payer: Medicare Other | Admitting: Family

## 2021-01-12 DIAGNOSIS — K429 Umbilical hernia without obstruction or gangrene: Secondary | ICD-10-CM

## 2021-01-13 ENCOUNTER — Telehealth: Payer: Self-pay | Admitting: Family Medicine

## 2021-01-13 NOTE — Telephone Encounter (Signed)
Patient called after hour line and states that he had COVID and he believes he does not have it anymore. He is needing to know where he can go to get tested to confirm it is negative. He does not have anymore symptoms. The home tests are still showing positive.  Please advise

## 2021-01-13 NOTE — Telephone Encounter (Signed)
LVM for testing locations per patient request.

## 2021-01-20 NOTE — Progress Notes (Signed)
Remote pacemaker transmission.   

## 2021-01-26 ENCOUNTER — Other Ambulatory Visit: Payer: Self-pay | Admitting: Cardiovascular Disease

## 2021-02-01 ENCOUNTER — Telehealth: Payer: Self-pay | Admitting: *Deleted

## 2021-02-01 NOTE — Chronic Care Management (AMB) (Signed)
  Chronic Care Management   Note  02/01/2021 Name: [name] MRN: [id number] DOB: Dec 13, 1940  [name] is a 80 y.o. year old male who is a primary care patient of [name], MD. I reached out to [name] by phone today in response to a referral sent by [name] PCP, Dr. Sarajane Jews.      [name] was given information about Chronic Care Management services today including:  CCM service includes personalized support from designated clinical staff supervised by his physician, including individualized plan of care and coordination with other care providers 24/7 contact phone numbers for assistance for urgent and routine care needs. Service will only be billed when office clinical staff spend 20 minutes or more in a month to coordinate care. Only one practitioner may furnish and bill the service in a calendar month. The patient may stop CCM services at any time (effective at the end of the month) by phone call to the office staff. The patient will be responsible for cost sharing (co-pay) of up to 20% of the service fee (after annual deductible is met).  Patient agreed to services and verbal consent obtained.   Follow up plan: Telephone appointment with care management team member scheduled for:02/20/2021  Telesia Ates  Care Guide, Embedded Care Coordination [place]  Care Management

## 2021-02-15 ENCOUNTER — Encounter: Payer: Self-pay | Admitting: Family Medicine

## 2021-02-15 ENCOUNTER — Telehealth (INDEPENDENT_AMBULATORY_CARE_PROVIDER_SITE_OTHER): Payer: Medicare Other | Admitting: Family Medicine

## 2021-02-15 DIAGNOSIS — F1190 Opioid use, unspecified, uncomplicated: Secondary | ICD-10-CM | POA: Diagnosis not present

## 2021-02-15 DIAGNOSIS — M79642 Pain in left hand: Secondary | ICD-10-CM | POA: Diagnosis not present

## 2021-02-15 DIAGNOSIS — G8929 Other chronic pain: Secondary | ICD-10-CM | POA: Diagnosis not present

## 2021-02-15 DIAGNOSIS — Z86010 Personal history of colonic polyps: Secondary | ICD-10-CM | POA: Diagnosis not present

## 2021-02-15 DIAGNOSIS — M5440 Lumbago with sciatica, unspecified side: Secondary | ICD-10-CM | POA: Diagnosis not present

## 2021-02-15 MED ORDER — OXYCODONE HCL 30 MG PO TABS: 30.0000 mg | ORAL_TABLET | Freq: Four times a day (QID) | ORAL | 0 refills | Status: DC | PRN

## 2021-02-15 NOTE — Progress Notes (Signed)
   Subjective:    Patient ID: [name], male    DOB: 1940-12-11, 80 y.o.   MRN: [id number]  HPI Virtual Visit via Telephone Note  I connected with the patient on 02/15/21 at 11:00 AM EDT by telephone and verified that I am speaking with the correct person using two identifiers.   I discussed the limitations, risks, security and privacy concerns of performing an evaluation and management service by telephone and the availability of in person appointments. I also discussed with the patient that there may be a patient responsible charge related to this service. The patient expressed understanding and agreed to proceed.  Location patient: home Location provider: work or home office Participants present for the call: patient, provider Patient did not have a visit in the prior 7 days to address this/these issue(s).   History of Present Illness: Here for pain management, he is doing well.   Observations/Objective: Patient sounds cheerful and well on the phone. I do not appreciate any SOB. Speech and thought processing are grossly intact. Patient reported vitals:  Assessment and Plan: Pain management. Indication for chronic opioid: low back pain Medication and dose: Oxycodone 30 mg # pills per month: 120 Last UDS date: 08-23-20 Opioid Treatment Agreement signed (Y/N): 08-26-17 Opioid Treatment Agreement last reviewed with patient:  02-15-21 [facility] reviewed this encounter (include red flags): Yes Meds were refilled.  [name], MD   Follow Up Instructions:     [phone] 5-10 [phone] 11-20 [phone] 21-30 I did not refer this patient for an OV in the next 24 hours for this/these issue(s).  I discussed the assessment and treatment plan with the patient. The patient was provided an opportunity to ask questions and all were answered. The patient agreed with the plan and demonstrated an understanding of the instructions.   The patient was advised to call back or seek an in-person  evaluation if the symptoms worsen or if the condition fails to improve as anticipated.  I provided 16 minutes of non-face-to-face time during this encounter.   [name], MD     Review of Systems     Objective:   Physical Exam        Assessment & Plan:

## 2021-02-20 ENCOUNTER — Other Ambulatory Visit: Payer: Self-pay | Admitting: Cardiology

## 2021-02-20 ENCOUNTER — Telehealth: Payer: Medicare Other

## 2021-02-22 ENCOUNTER — Telehealth: Payer: Self-pay

## 2021-02-22 NOTE — Telephone Encounter (Signed)
Patient returning call would like a call back

## 2021-02-23 NOTE — Telephone Encounter (Addendum)
Called patient, lvm unsure what initial call was about.   Patient has an upcoming appointment scheduled for 02/27/21, possibly reminder call.

## 2021-02-24 NOTE — Telephone Encounter (Signed)
Send a MyChart message advising pt to call the office back

## 2021-02-27 ENCOUNTER — Ambulatory Visit (INDEPENDENT_AMBULATORY_CARE_PROVIDER_SITE_OTHER): Payer: Medicare Other

## 2021-02-27 DIAGNOSIS — I4892 Unspecified atrial flutter: Secondary | ICD-10-CM

## 2021-02-27 DIAGNOSIS — E785 Hyperlipidemia, unspecified: Secondary | ICD-10-CM

## 2021-02-27 DIAGNOSIS — M5440 Lumbago with sciatica, unspecified side: Secondary | ICD-10-CM

## 2021-02-27 DIAGNOSIS — I10 Essential (primary) hypertension: Secondary | ICD-10-CM

## 2021-02-27 DIAGNOSIS — I2510 Atherosclerotic heart disease of native coronary artery without angina pectoris: Secondary | ICD-10-CM
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
## 2021-02-27 NOTE — Patient Instructions (Addendum)
Visit Information   PATIENT GOALS:   Goals Addressed             This Visit's Progress    RNCM:Cope with Chronic Pain       Timeframe:  Long-Range Goal Priority:  Medium Start Date:       02/27/21                      Expected End Date:   07/23/21                    Follow Up Date 03/31/21    - learn relaxation techniques - practice acceptance of chronic pain - practice relaxation or meditation daily - tell myself I can (not I can't) - think of new ways to do favorite things - use distraction techniques - use relaxation during pain    Why is this important?   Stress makes chronic pain feel worse.  Feelings like depression, anxiety, stress and anger can make your body more sensitive to pain.  Learning ways to cope with stress or depression may help you find some relief from the pain.     Notes:      RNCM:Track and Manage My Blood Pressure-Hypertension   On track    Timeframe:  Long-Range Goal Priority:  Medium Start Date:      02/27/21                       Expected End Date:   07/23/21                    Follow Up Date 03/31/21    - check blood pressure 3 times per week - choose a place to take my blood pressure (home, clinic or office, retail store) - write blood pressure results in a log or diary    Why is this important?   You won't feel high blood pressure, but it can still hurt your blood vessels.  High blood pressure can cause heart or kidney problems. It can also cause a stroke.  Making lifestyle changes like losing a little weight or eating less salt will help.  Checking your blood pressure at home and at different times of the day can help to control blood pressure.  If the doctor prescribes medicine remember to take it the way the doctor ordered.  Call the office if you cannot afford the medicine or if there are questions about it.     Notes:      RNCM:Track and Manage Symptoms-Heart Failure   On track    Timeframe:  Long-Range Goal Priority:  High Start Date:        02/27/21                      Expected End Date:    07/23/21                  Follow Up Date 03/31/21    - begin a heart failure diary - develop a rescue plan - eat more whole grains, fruits and vegetables, lean meats and healthy fats - follow rescue plan if symptoms flare-up - know when to call the doctor - track symptoms and what helps feel better or worse - dress right for the weather, hot or cold    Why is this important?   You will be able to handle your symptoms better if you keep track of  them.  Making some simple changes to your lifestyle will help.  Eating healthy is one thing you can do to take good care of yourself.    Notes:       Heart Failure, Self-Care Heart failure is a serious condition. The following information explains things you need to do to take care of yourself at home. To help you stay as healthy as possible, you may be asked to change your diet, take certain medicines, and make other changes in your life. Your doctor may also give you more specificinstructions. If you have problems or questions, call your doctor. What are the risks? Having heart failure makes it more likely for you to have some problems. These problems can get worse if you do not take good care of yourself. Problems may include: Damage to the kidneys, liver, or lungs. Malnutrition. Abnormal heart rhythms. Blood clotting problems that could cause a stroke. Supplies needed: Scale for weighing yourself. Blood pressure monitor. Notebook. Medicines. How to care for yourself when you have heart failure Medicines Take over-the-counter and prescription medicines only as told by your doctor. Take your medicines every day. Do not stop taking your medicine unless your doctor tells you to do so. Do not skip any medicines. Get your prescriptions refilled before you run out of medicine. This is important. Talk with your doctor if you cannot afford your medicines. Eating and drinking  Eat  heart-healthy foods. Talk with a diet specialist (dietitian) to create an eating plan. Limit salt (sodium) if told by your doctor. Ask your diet specialist to tell you which seasonings are healthy for your heart. Cook in healthy ways instead of frying. Healthy ways of cooking include roasting, grilling, broiling, baking, poaching, steaming, and stir-frying. Choose foods that: Have no trans fat. Are low in saturated fat and cholesterol. Choose healthy foods, such as: Fresh or frozen fruits and vegetables. Fish. Low-fat (lean) meats. Legumes, such as beans, peas, and lentils. Fat-free or low-fat dairy products. Whole-grain foods. High-fiber foods. Limit how much fluid you drink, if told by your doctor.  Alcohol use Do not drink alcohol if: Your doctor tells you not to drink. Your heart was damaged by alcohol, or you have very bad heart failure. You are pregnant, may be pregnant, or are planning to become pregnant. If you drink alcohol: Limit how much you have to: 0-1 drink a day for women. 0-2 drinks a day for men. Know how much alcohol is in your drink. In the [place] one drink equals one 12 oz bottle of beer (355 mL), one 5 oz glass of wine (148 mL), or one 1 oz glass of hard liquor (44 mL). Lifestyle  Do not smoke or use any products that contain nicotine or tobacco. If you need help quitting, ask your doctor. Do not use nicotine gum or patches before talking to your doctor. Do not use illegal drugs. Lose weight if told by your doctor. Do physical activity if told by your doctor. Talk to your doctor before you begin an exercise if: You are an older adult. You have very bad heart failure. Learn to manage stress. If you need help, ask your doctor. Get physical rehab (rehabilitation) to help you stay independent and to help with your quality of life. Participate in a cardiac rehab program. This program helps you improve your health through exercise, education, and counseling. Plan  time to rest when you get tired.  Check weight and blood pressure  Weigh yourself every day. This will help  you to know if fluid is building up in your body. Weigh yourself every morning after you pee (urinate) and before you eat breakfast. Wear the same amount of clothing each time. Write down your daily weight. Give your record to your doctor. Check and write down your blood pressure as told by your doctor. Check your pulse as told by your doctor.  Dealing with very hot and very cold weather If it is very hot: Avoid activities that take a lot of energy. Use air conditioning or fans, or find a cooler place. Avoid caffeine and alcohol. Wear clothing that is loose-fitting, lightweight, and light-colored. If it is very cold: Avoid activities that take a lot of energy. Layer your clothes. Wear mittens or gloves, a hat, and a face covering when you go outside. Avoid alcohol. Follow these instructions at home: Stay up to date with shots (vaccines). Get pneumococcal and flu (influenza) shots. Keep all follow-up visits. Contact a doctor if: You gain 2-3 lb (1-1.4 kg) in 24 hours or 5 lb (2.3 kg) in a week. You have increasing shortness of breath. You cannot do your normal activities. You get tired easily. You cough a lot. You do not feel like eating or feel like you may vomit (nauseous). You have swelling in your hands, feet, ankles, or belly (abdomen). You cannot sleep well because it is hard to breathe. You feel like your heart is beating fast (palpitations). You get dizzy when you stand up. You feel depressed or sad. Get help right away if: You have trouble breathing. You or someone else notices a change in your behavior, such as having trouble staying awake. You have chest pain or discomfort. You pass out (faint). These symptoms may be an emergency. Get help right away. Call your local emergency services (911 in the [place] Do not wait to see if the symptoms will go away. Do  not drive yourself to the hospital. Summary Heart failure is a serious condition. To care for yourself, you may have to change your diet, take medicines, and make other lifestyle changes. Take your medicines every day. Do not stop taking them unless your doctor tells you to do so. Limit salt and eat heart-healthy foods. Ask your doctor if you can drink alcohol. You may have to stop alcohol use if you have very bad heart failure. Contact your doctor if you gain weight quickly or feel that your heart is beating too fast. Get help right away if you pass out or have chest pain or trouble breathing. This information is not intended to replace advice given to you by your health care provider. Make sure you discuss any questions you have with your healthcare provider. Document Revised: 01/30/2020 Document Reviewed: 01/30/2020 Elsevier Patient Education  [place].  Chronic Pain, Adult Chronic pain is a type of pain that lasts or keeps coming back for at least 3-6 months. You may have headaches, pain in the abdomen, or pain in other areas of the body. Chronic pain may be related to an illness, such as fibromyalgia or complex regional pain syndrome. Chronic pain may also be related to an injuryor a health condition. Sometimes, the cause of chronic pain is not known. Chronic pain can make it hard for you to do daily activities. If not treated, chronic pain can lead to anxiety and depression. Treatment depends on the cause and severity of your pain. You may need to work with a pain specialist to come up with a treatment plan. The plan  may include medicine, counseling, and physical therapy. Many people benefit from a combination of two or more typesof treatment to control their pain. Follow these instructions at home: Medicines Take over-the-counter and prescription medicines only as told by your health care provider. Ask your health care provider if the medicine prescribed to you: Requires you to  avoid driving or using machinery. Can cause constipation. You may need to take these actions to prevent or treat constipation: Drink enough fluid to keep your urine pale yellow. Take over-the-counter or prescription medicines. Eat foods that are high in fiber, such as beans, whole grains, and fresh fruits and vegetables. Limit foods that are high in fat and processed sugars, such as fried or sweet foods. Treatment plan Follow your treatment plan as told by your health care provider. This may include: Gentle, regular exercise. Eating a healthy diet that includes foods such as vegetables, fruits, fish, and lean meats. Cognitive or behavioral therapy that changes the way you think or act in response to the pain. This may help improve how you feel. Working with a physical therapist. Meditation, yoga, acupuncture, or massage therapy. Aroma, color, light, or sound therapy. Local electrical stimulation. The electrical pulses help to relieve pain by temporarily stopping the nerve impulses that cause you to feel pain. Injections. These deliver numbing or pain-relieving medicines into the spine or the area of pain.  Lifestyle  Ask your health care provider whether you should keep a pain diary. Your health care provider will tell you what information to write in the diary. This may include when you have pain, what the pain feels like, and how medicines and other behaviors or treatments help to reduce the pain. Consider talking with a mental health care provider about how to manage chronic pain. Consider joining a chronic pain support group. Try to control or lower your stress levels. Talk with your health care provider about ways to do this.  General instructions Learn as much as you can about how to manage your chronic pain. Ask your health care provider if an intensive pain rehabilitation program or a chronic pain specialist would be helpful. Check your pain level as told by your health care  provider. Ask your health care provider if you should use a pain scale. It is up to you to get the results of any tests that were done. Ask your health care provider, or the department that is doing the tests, when your results will be ready. Keep all follow-up visits as told by your health care provider. This is important. Contact a health care provider if: Your pain gets worse, or you have new pain. You have trouble sleeping. You have trouble doing your normal activities. Your pain is not controlled with treatment. You have side effects from pain medicine. You feel weak. You notice any other changes that show that your condition is getting worse. Get help right away if: You lose feeling or have numbness in your body. You lose control of bowel or bladder function. Your pain suddenly gets much worse. You develop shaking or chills. You develop confusion. You develop chest pain. You have trouble breathing or shortness of breath. You pass out. You have thoughts about hurting yourself or others. If you ever feel like you may hurt yourself or others, or have thoughts about taking your own life, get help right away. Go to your nearest emergency department or: Call your local emergency services (911 in the [place] Call a suicide crisis helpline, such as the [employer]  Suicide Prevention Lifeline at (315)631-9182. This is open 24 hours a day in the [place] Text the Crisis Text Line at 321 688 8697 (in the [place].). Summary Chronic pain is a type of pain that lasts or keeps coming back for at least 3-6 months. Chronic pain may be related to an illness, injury, or other health condition. Sometimes, the cause of chronic pain is not known. Treatment depends on the cause and severity of your pain. Many people benefit from a combination of two or more types of treatment to control their pain. Follow your treatment plan as told by your health care provider. This information is not intended to replace advice given  to you by your health care provider. Make sure you discuss any questions you have with your healthcare provider. Document Revised: 03/26/2019 Document Reviewed: 03/26/2019 Elsevier Patient Education  2022 [place].   Consent to CCM Services: [name] was given information about Chronic Care Management services including:  CCM service includes personalized support from designated clinical staff supervised by his physician, including individualized plan of care and coordination with other care providers 24/7 contact phone numbers for assistance for urgent and routine care needs. Service will only be billed when office clinical staff spend 20 minutes or more in a month to coordinate care. Only one practitioner may furnish and bill the service in a calendar month. The patient may stop CCM services at any time (effective at the end of the month) by phone call to the office staff. The patient will be responsible for cost sharing (co-pay) of up to 20% of the service fee (after annual deductible is met).  Patient agreed to services and verbal consent obtained.   Patient verbalizes understanding of instructions provided today and agrees to view in [place].   Telephone follow up appointment with care management team member scheduled for: 03/31/21 at 31 AM  Peter Garter RN, [facility], CDE Care Management Coordinator [facility] [phone], Mobile [phone]   CLINICAL CARE PLAN: Patient Care Plan: RNCM:Cardiovascular disease Management (HF,CAD,AF,HTN and HLD)     Problem Identified: Lack of long term managment of Cardiovascular disease (HF,CAD,AF,HTN and HLD)   Priority: High     Long-Range Goal: Effective long term Cardiovascular disease Management (HF,CAD,AF,HTN and HLD)   Start Date: 02/27/2021  Expected End Date: 07/23/2021  This Visit's Progress: On track  Priority: High  Note:   Current Barriers:  Knowledge deficits related to basic Cardiovascular  disease Management (HF,CAD,AF,HTN and HLD) pathophysiology and self care management Unable to independently Self manage Cardiovascular disease (HF,CAD,AF,HTN and HLD) Does not adhere to provider recommendations re:  Does not adhere to prescribed medication regimen Financial strain Pt states he sometimes does not take his fluid pill if he is going out.  States he wants to know what each pill is for and sometimes does not take if he does not know why he is taking.  Denies any chest pains or swelling.  States he does get short of breath walking to his garage which is about 200 yards. States he does use salt on his food and does not always eat healthy.  States he has been strugling with learning to cope since his wife died last year.  States he would like to down size but he does not know how Nurse Case Manager Clinical Goal(s):  patient will weigh self daily and record patient will verbalize understanding of Heart Failure Action Plan and when to call doctor patient will take all Heart Failure mediations as prescribed Interventions:  Collaboration with [name], MD regarding development and update of comprehensive plan of care as evidenced by provider attestation and co-signature Inter-disciplinary care team collaboration (see longitudinal plan of care) Basic overview and discussion of pathophysiology of Heart Failure Provided written and verbal education on low sodium diet Reviewed Heart Failure Action Plan in depth and provided written copy Assessed for scales in home-has scales Discussed importance of daily weight Reviewed role of diuretics in prevention of fluid overload Provided education to patient re: stroke prevention, s/s of heart attack and stroke, DASH diet, complications of uncontrolled blood pressure Advised patient, providing education and rationale, to monitor blood pressure 3 times a week and record, calling PCP for findings outside established parameters Social Work referral  to speak about grief grief counseling and stress Pharmacy referral for issues with adherence and knowledge of medications  Care guide referral for food resources and housing options Self-Care Activities:  Takes Heart Failure Medications as prescribed Weighs daily and record (notifying MD of 3 lb weight gain over night or 5 lb in a week) Verbalizes understanding of and follows CHF Action Plan Adheres to low sodium diet  Patient Goals:  - Take Heart Failure Medications as prescribed - Weigh daily and record (notify MD with 3 lb weight gain over night or 5 lb in a week) - Follow CHF Action Plan - Adhere to low sodium diet - develop a rescue plan - eat more whole grains, fruits and vegetables, lean meats and healthy fats - follow rescue plan if symptoms flare-up - know when to call the doctor - track symptoms and what helps feel better or worse - dress right for the weather, hot or cold - avoid heavy exercise on very hot days - pace activity allowing for rest - call office if I gain more than 2 pounds in one day or 5 pounds in one week - keep legs up while sitting - track weight in diary - use salt in moderation - watch for swelling in feet, ankles and legs every day - weigh myself daily - check blood pressure 3 times per week - choose a place to take my blood pressure (home, clinic or office, retail store) - write blood pressure results in a log or diary Follow Up Plan: Telephone follow up appointment with care management team member scheduled for: 03/31/21 at 10 AM The patient has been provided with contact information for the care management team and has been advised to call with any health related questions or concerns.      Patient Care Plan: RNCM:Chronic Pain (Adult)     Problem Identified: Chronic Pain Management (Chronic Pain)      Long-Range Goal: Chronic Pain Managed   Start Date: 02/27/2021  Expected End Date: 07/23/2021  This Visit's Progress: On track  Priority: Medium   Note:   Current Barriers:  Knowledge Deficits related to self-health management of acute or chronic pain lower back and restless leg syndrome Chronic Disease Management support and education needs related to chronic pain Knowledge Deficits related to self management of chronic lower back pain Chronic Disease Management support and education needs related to self management of chronic lower back pain Unable to independently self management of chronic lower back pain States that his lower back and legs have pain that gets worse as the day goes by.  States he takes his pain medication in the evening which helps some.  Clinical Goal(s):  patient will verbalize understanding of plan for pain management. , patient will  meet with RN Care Manager to address self management of chronic lower back pain, patient will attend all scheduled medical appointments: labs 03/30/21, Dr. [name] 06/28/21, patient will demonstrate use of different relaxation  skills and/or diversional activities to assist with pain reduction (distraction, imagery, relaxation, massage, acupressure, TENS, heat, and cold application., patient will report pain at a level less than 3 to 4 on a 10-10 rating scale., patient will use pharmacological and nonpharmacological pain relief strategies as prescribed. , and patient will verbalize acceptable level of pain relief and ability to engage in desired activities Interventions:  Collaboration with [name], MD regarding development and update of comprehensive plan of care as evidenced by provider attestation and co-signature Pain assessment performed Medications reviewed Discussed plans with patient for ongoing care management follow up and provided patient with direct contact information for care management team Evaluation of current treatment plan related to self management of chronic lower back pain and patient's adherence to plan as established by provider. Provided education to patient  re: self management of chronic lower back pain Reviewed medications with patient and discussed adherence Reviewed scheduled/upcoming provider appointments including:  Care Guide referral for food resources and housing options Social Work referral for grief counseling  Pharmacy referral for adherence and knowledge issues Patient Goals/Self Care Activities:  Will self-administer medications as prescribed Will attend all scheduled provider appointments Will call pharmacy for medication refills 7 days prior to needed refill date Patient will calls provider office for new concerns or questions Follow Up Plan: Telephone follow up appointment with care management team member scheduled for: 03/31/21 at 10 AM The patient has been provided with contact information for the care management team and has been advised to call with any health related questions or concerns.

## 2021-02-28 ENCOUNTER — Other Ambulatory Visit: Payer: Self-pay | Admitting: Family Medicine

## 2021-02-28 ENCOUNTER — Telehealth: Payer: Self-pay

## 2021-02-28 NOTE — Progress Notes (Signed)
I tried to reach out to patient today No answer will continue to call   Noreene Larsson, [place], [place], [place] [address] Direct Dial: [phone] [email or web address]: [email or web address]

## 2021-02-28 NOTE — Chronic Care Management (AMB) (Signed)
  Chronic Care Management   Note  02/28/2021 Name: GARRETTE JEMERSON MRN: [id number] DOB: [birth year]  CAYTON DAFOE is a 80 y.o. year old male who is a primary care patient of [name], MD. BALEN PINAULT is currently enrolled in care management services. An additional referral for LCSW was placed.   Follow up plan: Unsuccessful telephone outreach attempt made. A HIPAA compliant phone message was left for the patient providing contact information and requesting a return call.  The care management team will reach out to the patient again over the next 3 days.  If patient returns call to provider office, please advise to call [place]  at [address], [place], [place], [place] [address] Direct Dial: [phone] [email or web address] Website: [email or web address]

## 2021-03-03 ENCOUNTER — Telehealth: Payer: Self-pay | Admitting: Family Medicine

## 2021-03-03 NOTE — Progress Notes (Signed)
Pt is on NPL message left for pt to return my call  Thanks  Tato

## 2021-03-03 NOTE — Chronic Care Management (AMB) (Signed)
  Chronic Care Management   Outreach Note  03/03/2021 Name: [name] MRN: [id number] DOB: November 01, 1940  Referred by: [name], MD Reason for referral : No chief complaint on file.   An unsuccessful telephone outreach was attempted today. The patient was referred to the pharmacist for assistance with care management and care coordination.   Follow Up Plan:   Tatjana Dellinger Upstream Scheduler

## 2021-03-06 ENCOUNTER — Telehealth: Payer: Self-pay | Admitting: Family Medicine

## 2021-03-06 NOTE — Progress Notes (Signed)
  Chronic Care Management   Note  03/06/2021 Name: SACHA BESERRA MRN: [id number] DOB: January 15, 1941  TYR PEARCE is a 80 y.o. year old male who is a primary care patient of [name], MD. I reached out to [name] by phone today in response to a referral sent by Mr. ERYK CHESLOCK PCP, [name], MD.   Mr. Tona was given information about Chronic Care Management services today including:  CCM service includes personalized support from designated clinical staff supervised by his physician, including individualized plan of care and coordination with other care providers 24/7 contact phone numbers for assistance for urgent and routine care needs. Service will only be billed when office clinical staff spend 20 minutes or more in a month to coordinate care. Only one practitioner may furnish and bill the service in a calendar month. The patient may stop CCM services at any time (effective at the end of the month) by phone call to the office staff.   Patient agreed to services and verbal consent obtained.   Follow up plan:   Tatjana Secretary/administrator

## 2021-03-07 ENCOUNTER — Other Ambulatory Visit: Payer: Self-pay

## 2021-03-07 ENCOUNTER — Telehealth: Payer: Self-pay

## 2021-03-07 MED ORDER — ROPINIROLE HCL 5 MG PO TABS: 5.0000 mg | ORAL_TABLET | Freq: Four times a day (QID) | ORAL | 1 refills | Status: DC

## 2021-03-07 NOTE — Chronic Care Management (AMB) (Signed)
  Chronic Care Management   Note  03/07/2021 Name: GARRIE COCCA MRN: [id number] DOB: 26-Mar-1941  DMETRIUS BARNFIELD is a 80 y.o. year old male who is a primary care patient of [name], MD. TYJAE CHRISTOFFER is currently enrolled in care management services. An additional referral for Social Work was placed.   Follow up plan: Telephone appointment with care management team member scheduled for:03/20/2021  Daimian Sudberry  Care Guide, Embedded Care Coordination [place]  Care Management  Direct Dial: [phone]

## 2021-03-07 NOTE — Telephone Encounter (Signed)
Patient called wanting Rx refill ropinirole (REQUIP) 5 MG tablet

## 2021-03-07 NOTE — Telephone Encounter (Signed)
Refill has been sent to [facility] in [place]

## 2021-03-09 ENCOUNTER — Telehealth: Payer: Self-pay | Admitting: Family Medicine

## 2021-03-09 NOTE — Telephone Encounter (Signed)
   Telephone encounter was:  Unsuccessful.  03/09/2021 Name: [name] MRN: [id number] DOB: [birth year]  Unsuccessful outbound call made today to assist with:  Food Insecurity and wanting to downsize housing.  Outreach Attempt:  1st Attempt  A HIPAA compliant voice message was left requesting a return call.  Instructed patient to call back at [phone].  April Green Care Guide, Embedded Care Coordination [place], Care Management Phone: [phone] Email: [email or web address]

## 2021-03-13 DIAGNOSIS — K429 Umbilical hernia without obstruction or gangrene: Secondary | ICD-10-CM | POA: Diagnosis not present

## 2021-03-13 DIAGNOSIS — K4031 Unilateral inguinal hernia, with obstruction, without gangrene, recurrent: Secondary | ICD-10-CM | POA: Diagnosis not present

## 2021-03-13 DIAGNOSIS — K5730 Diverticulosis of large intestine without perforation or abscess without bleeding: Secondary | ICD-10-CM | POA: Diagnosis not present

## 2021-03-13 DIAGNOSIS — K4090 Unilateral inguinal hernia, without obstruction or gangrene, not specified as recurrent: Secondary | ICD-10-CM | POA: Diagnosis not present

## 2021-03-13 DIAGNOSIS — K432 Incisional hernia without obstruction or gangrene: Secondary | ICD-10-CM | POA: Diagnosis not present

## 2021-03-20 ENCOUNTER — Telehealth: Payer: Self-pay | Admitting: Family Medicine

## 2021-03-20 ENCOUNTER — Ambulatory Visit: Payer: Medicare Other | Admitting: *Deleted

## 2021-03-20 DIAGNOSIS — E785 Hyperlipidemia, unspecified: Secondary | ICD-10-CM

## 2021-03-20 DIAGNOSIS — F418 Other specified anxiety disorders: Secondary | ICD-10-CM

## 2021-03-20 DIAGNOSIS — I2510 Atherosclerotic heart disease of native coronary artery without angina pectoris: Secondary | ICD-10-CM

## 2021-03-20 DIAGNOSIS — I10 Essential (primary) hypertension: Secondary | ICD-10-CM

## 2021-03-20 NOTE — Telephone Encounter (Signed)
   Telephone encounter was:  Successful.  03/20/2021 Name: DERRIL GALDI MRN: [id number] DOB: 1941-05-02  SHARAN ASHMEAD is a 80 y.o. year old male who is a primary care patient of [name], MD . The community resource team was consulted for assistance with Food Insecurity and downsizing his home.   Care guide performed the following interventions: Patient provided with information about care guide support team and interviewed to confirm resource needs Pt states his housing situation will be taken care of next week. I am emailing him a list of local food banks to [email or web address] .  Follow Up Plan:  No further follow up planned at this time. The patient has been provided with needed resources.  April Green Care Guide, Embedded Care Coordination [place], Care Management Phone: [phone] Email: [email or web address]

## 2021-03-20 NOTE — Patient Instructions (Signed)
Visit Information   PATIENT GOALS:   Goals Addressed               This Visit's Progress     COMPLETED: Track and Manage My Symptoms of Grief and Depression. (pt-stated)   On track     Timeframe:  Short-Term Goal Priority:  High Start Date:  03/20/2021                         Expected End Date:  03/20/2021                 Follow-Up Date: No Follow-Up Required, Per Patient.  Patient Goals/Self-Care Activities: Consider self-enrollment in a grief and loss support group.  Incorporate into daily practice - relaxation techniques, deep breathing exercises and mindfulness meditation strategies. Contact LCSW directly (# [id number]) if you change your mind about wanting to receive social work services and resources, or if additional social work needs are identified in the near future.          Consent to CCM Services: Mr. Blanda was given information about Chronic Care Management services including:  CCM service includes personalized support from designated clinical staff supervised by his physician, including individualized plan of care and coordination with other care providers 24/7 contact phone numbers for assistance for urgent and routine care needs. Service will only be billed when office clinical staff spend 20 minutes or more in a month to coordinate care. Only one practitioner may furnish and bill the service in a calendar month. The patient may stop CCM services at any time (effective at the end of the month) by phone call to the office staff. The patient will be responsible for cost sharing (co-pay) of up to 20% of the service fee (after annual deductible is met).  Patient agreed to services and verbal consent obtained.   Patient verbalizes understanding of instructions provided today and agrees to view in [place].   No Follow-Up Required, Per Patient.  [place] LCSW Licensed Clinical Social Worker [facility] [phone]   CLINICAL CARE  PLAN: Patient Care Plan: RNCM:Cardiovascular disease Management (HF,CAD,AF,HTN and HLD)     Problem Identified: Lack of long term managment of Cardiovascular disease (HF,CAD,AF,HTN and HLD)   Priority: High     Long-Range Goal: Effective long term Cardiovascular disease Management (HF,CAD,AF,HTN and HLD)   Start Date: 02/27/2021  Expected End Date: 07/23/2021  This Visit's Progress: On track  Priority: High  Note:   Current Barriers:  Knowledge deficits related to basic Cardiovascular disease Management (HF,CAD,AF,HTN and HLD) pathophysiology and self care management Unable to independently Self manage Cardiovascular disease (HF,CAD,AF,HTN and HLD) Does not adhere to provider recommendations re:  Does not adhere to prescribed medication regimen Financial strain Pt states he sometimes does not take his fluid pill if he is going out.  States he wants to know what each pill is for and sometimes does not take if he does not know why he is taking.  Denies any chest pains or swelling.  States he does get short of breath walking to his garage which is about 200 yards. States he does use salt on his food and does not always eat healthy.  States he has been strugling with learning to cope since his wife died last year.  States he would like to down size but he does not know how Nurse Case Manager Clinical Goal(s):  patient will weigh self daily and record patient will verbalize understanding of  Heart Failure Action Plan and when to call doctor patient will take all Heart Failure mediations as prescribed Interventions:  Collaboration with [name], MD regarding development and update of comprehensive plan of care as evidenced by provider attestation and co-signature Inter-disciplinary care team collaboration (see longitudinal plan of care) Basic overview and discussion of pathophysiology of Heart Failure Provided written and verbal education on low sodium diet Reviewed Heart Failure Action Plan  in depth and provided written copy Assessed for scales in home-has scales Discussed importance of daily weight Reviewed role of diuretics in prevention of fluid overload Provided education to patient re: stroke prevention, s/s of heart attack and stroke, DASH diet, complications of uncontrolled blood pressure Advised patient, providing education and rationale, to monitor blood pressure 3 times a week and record, calling PCP for findings outside established parameters Social Work referral to speak about grief grief counseling and stress Pharmacy referral for issues with adherence and knowledge of medications  Care guide referral for food resources and housing options Self-Care Activities:  Takes Heart Failure Medications as prescribed Weighs daily and record (notifying MD of 3 lb weight gain over night or 5 lb in a week) Verbalizes understanding of and follows CHF Action Plan Adheres to low sodium diet  Patient Goals:  - Take Heart Failure Medications as prescribed - Weigh daily and record (notify MD with 3 lb weight gain over night or 5 lb in a week) - Follow CHF Action Plan - Adhere to low sodium diet - develop a rescue plan - eat more whole grains, fruits and vegetables, lean meats and healthy fats - follow rescue plan if symptoms flare-up - know when to call the doctor - track symptoms and what helps feel better or worse - dress right for the weather, hot or cold - avoid heavy exercise on very hot days - pace activity allowing for rest - call office if I gain more than 2 pounds in one day or 5 pounds in one week - keep legs up while sitting - track weight in diary - use salt in moderation - watch for swelling in feet, ankles and legs every day - weigh myself daily - check blood pressure 3 times per week - choose a place to take my blood pressure (home, clinic or office, retail store) - write blood pressure results in a log or diary Follow Up Plan: Telephone follow up  appointment with care management team member scheduled for: 03/31/21 at 10 AM The patient has been provided with contact information for the care management team and has been advised to call with any health related questions or concerns.      Patient Care Plan: RNCM:Chronic Pain (Adult)     Problem Identified: Chronic Pain Management (Chronic Pain)      Long-Range Goal: Chronic Pain Managed   Start Date: 02/27/2021  Expected End Date: 07/23/2021  This Visit's Progress: On track  Priority: Medium  Note:   Current Barriers:  Knowledge Deficits related to self-health management of acute or chronic pain lower back and restless leg syndrome Chronic Disease Management support and education needs related to chronic pain Knowledge Deficits related to self management of chronic lower back pain Chronic Disease Management support and education needs related to self management of chronic lower back pain Unable to independently self management of chronic lower back pain States that his lower back and legs have pain that gets worse as the day goes by.  States he takes his pain medication in the  evening which helps some.  Clinical Goal(s):  patient will verbalize understanding of plan for pain management. , patient will meet with RN Care Manager to address self management of chronic lower back pain, patient will attend all scheduled medical appointments: labs 03/30/21, Dr. [name] 06/28/21, patient will demonstrate use of different relaxation  skills and/or diversional activities to assist with pain reduction (distraction, imagery, relaxation, massage, acupressure, TENS, heat, and cold application., patient will report pain at a level less than 3 to 4 on a 10-10 rating scale., patient will use pharmacological and nonpharmacological pain relief strategies as prescribed. , and patient will verbalize acceptable level of pain relief and ability to engage in desired activities Interventions:  Collaboration with [name], MD regarding development and update of comprehensive plan of care as evidenced by provider attestation and co-signature Pain assessment performed Medications reviewed Discussed plans with patient for ongoing care management follow up and provided patient with direct contact information for care management team Evaluation of current treatment plan related to self management of chronic lower back pain and patient's adherence to plan as established by provider. Provided education to patient re: self management of chronic lower back pain Reviewed medications with patient and discussed adherence Reviewed scheduled/upcoming provider appointments including:  Care Guide referral for food resources and housing options Social Work referral for grief counseling  Pharmacy referral for adherence and knowledge issues Patient Goals/Self Care Activities:  Will self-administer medications as prescribed Will attend all scheduled provider appointments Will call pharmacy for medication refills 7 days prior to needed refill date Patient will calls provider office for new concerns or questions Follow Up Plan: Telephone follow up appointment with care management team member scheduled for: 03/31/21 at 10 AM The patient has been provided with contact information for the care management team and has been advised to call with any health related questions or concerns.       Patient Care Plan: LCSW Plan of Care     Problem Identified: Track and Manage My Symptoms of Grief and Depression. Resolved 03/20/2021  Priority: High     Goal: Track and Manage My Symptoms of Grief and Depression. Completed 03/20/2021  Start Date: 03/20/2021  Expected End Date: 03/20/2021  This Visit's Progress: On track  Priority: High  Note:   Current Barriers:   Acute Mental Health needs related to CAD, HF, HTN, HLD, AFib, Chronic Low Back Pain, Depression with Anxiety and Grief. Needs Support, Education, and Care Coordination in  order to meet unmet mental health needs. Clinical Goal(s):  Patient will work with LCSW to reduce and manage symptoms of Depression with Anxiety and Grief.   Patient will increase knowledge and/or ability of:        Coping Skills, Healthy Habits, Self-Management Skills, Stress Reduction, Home Safety and Utilizing [employer] and Resources.   Clinical Interventions:  Assessed patient's previous treatment, needs, coping skills, current treatment, support system and barriers to care. PHQ-2 and PHQ-9 Depression Screening Tool performed and results reviewed with patient. Other interventions included:       Solution-Focused Therapy Performed, Mindfulness Meditation Strategies, Relaxation Techniques and Deep Breathing Exercises Encouraged, Active Listening/       Reflection Utilized, Emotional Support Provided, Problem Solving Gazelle, Psychoeducation /Health Education, Motivational        Interviewing, Brief Cognitive Behavioral Therapy Initiated, Reviewed Mental Health Medications and Discussed Compliance, Quality of Sleep Assessed and       Sleep Hygiene Techniques Promoted, Support Group Participation Encouraged,  Increase Level of Activity/Exercise, Verbalization of Feelings Encouraged,        Crisis Resource Education/Information Provided, Suicidal Ideation/Homicidal Ideation Assessed - None Present.   Patient interviewed and appropriate assessments performed. Provided mental health counseling with regards to Depression with Anxiety and Grief.   Discussed several options for long-term counseling based on need and insurance.  Collaboration with Primary Care Physician, Dr. [name] regarding development and update of comprehensive plan of care as evidenced by provider attestation and co-signature. Inter-disciplinary care team collaboration (see longitudinal plan of care). Patient Goals/Self-Care Activities: Consider self-enrollment in a grief and loss support group.   Incorporate into daily practice - relaxation techniques, deep breathing exercises and mindfulness meditation strategies. Contact LCSW directly (# [id number]) if you change your mind about wanting to receive social work services and resources, or if additional social work needs are identified in the near future. Follow-Up:  No Follow-Up Required, Per Patient.
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
## 2021-03-22 DIAGNOSIS — I2510 Atherosclerotic heart disease of native coronary artery without angina pectoris: Secondary | ICD-10-CM | POA: Diagnosis not present

## 2021-03-22 DIAGNOSIS — I10 Essential (primary) hypertension: Secondary | ICD-10-CM

## 2021-03-22 DIAGNOSIS — F418 Other specified anxiety disorders: Secondary | ICD-10-CM

## 2021-03-22 DIAGNOSIS — E785 Hyperlipidemia, unspecified: Secondary | ICD-10-CM

## 2021-03-29 ENCOUNTER — Other Ambulatory Visit: Payer: Self-pay

## 2021-03-29 ENCOUNTER — Ambulatory Visit (INDEPENDENT_AMBULATORY_CARE_PROVIDER_SITE_OTHER): Payer: Medicare HMO

## 2021-03-29 DIAGNOSIS — I495 Sick sinus syndrome: Secondary | ICD-10-CM

## 2021-03-30 ENCOUNTER — Ambulatory Visit (INDEPENDENT_AMBULATORY_CARE_PROVIDER_SITE_OTHER): Payer: Medicare Other | Admitting: Family Medicine

## 2021-03-30 ENCOUNTER — Encounter: Payer: Self-pay | Admitting: Family Medicine

## 2021-03-30 ENCOUNTER — Other Ambulatory Visit: Payer: Medicare Other

## 2021-03-30 VITALS — BP 132/80 | HR 71 | Temp 98.8°F | Wt 223.0 lb

## 2021-03-30 DIAGNOSIS — G2581 Restless legs syndrome: Secondary | ICD-10-CM

## 2021-03-30 DIAGNOSIS — M109 Gout, unspecified: Secondary | ICD-10-CM | POA: Diagnosis not present

## 2021-03-30 DIAGNOSIS — E039 Hypothyroidism, unspecified: Secondary | ICD-10-CM | POA: Diagnosis not present

## 2021-03-30 DIAGNOSIS — B351 Tinea unguium: Secondary | ICD-10-CM | POA: Diagnosis not present

## 2021-03-30 LAB — CUP PACEART REMOTE DEVICE CHECK
Battery Remaining Longevity: 122 mo
Battery Voltage: 2.99 V
Brady Statistic AP VP Percent: 11.76 %
Brady Statistic AP VS Percent: 19.24 %
Brady Statistic AS VP Percent: 14.15 %
Brady Statistic AS VS Percent: 54.85 %
Brady Statistic RA Percent Paced: 30.45 %
Brady Statistic RV Percent Paced: 25.93 %
Date Time Interrogation Session: 20220908005418
Implantable Lead Implant Date: 20180604
Implantable Lead Implant Date: 20180604
Implantable Lead Location: 753859
Implantable Lead Location: 753860
Implantable Lead Model: 5076
Implantable Lead Model: 5076
Implantable Pulse Generator Implant Date: 20180604
Lead Channel Impedance Value: 304 Ohm
Lead Channel Impedance Value: 342 Ohm
Lead Channel Impedance Value: 399 Ohm
Lead Channel Impedance Value: 418 Ohm
Lead Channel Pacing Threshold Amplitude: 0.625 V
Lead Channel Pacing Threshold Amplitude: 0.875 V
Lead Channel Pacing Threshold Pulse Width: 0.4 ms
Lead Channel Pacing Threshold Pulse Width: 0.4 ms
Lead Channel Sensing Intrinsic Amplitude: 1.875 mV
Lead Channel Sensing Intrinsic Amplitude: 1.875 mV
Lead Channel Sensing Intrinsic Amplitude: 4.25 mV
Lead Channel Sensing Intrinsic Amplitude: 4.25 mV
Lead Channel Setting Pacing Amplitude: 1.5 V
Lead Channel Setting Pacing Amplitude: 2.5 V
Lead Channel Setting Pacing Pulse Width: 0.4 ms
Lead Channel Setting Sensing Sensitivity: 1.2 mV

## 2021-03-30 LAB — T3, FREE: T3, Free: 3.3 pg/mL (ref 2.3–4.2)

## 2021-03-30 LAB — T4, FREE: Free T4: 0.93 ng/dL (ref 0.60–1.60)

## 2021-03-30 LAB — TSH: TSH: 0.12 u[IU]/mL — ABNORMAL LOW (ref 0.35–5.50)

## 2021-03-30 MED ORDER — METHYLPREDNISOLONE 4 MG PO TBPK: ORAL_TABLET | ORAL | 0 refills | Status: DC

## 2021-03-30 MED ORDER — PRAMIPEXOLE DIHYDROCHLORIDE 0.5 MG PO TABS: 0.5000 mg | ORAL_TABLET | Freq: Three times a day (TID) | ORAL | 5 refills | Status: DC

## 2021-03-30 MED ORDER — METHYLPREDNISOLONE ACETATE 40 MG/ML IJ SUSP
40.0000 mg | Freq: Once | INTRAMUSCULAR | Status: AC
  Administered 2021-03-30: 40 mg via INTRAMUSCULAR

## 2021-03-30 MED ORDER — TERBINAFINE HCL 250 MG PO TABS
250.0000 mg | ORAL_TABLET | Freq: Every day | ORAL | 5 refills | Status: DC
Start: 2021-03-30 — End: 2021-07-20

## 2021-03-30 MED ORDER — METHYLPREDNISOLONE ACETATE 80 MG/ML IJ SUSP
80.0000 mg | Freq: Once | INTRAMUSCULAR | Status: AC
  Administered 2021-03-30: 80 mg via INTRAMUSCULAR

## 2021-03-30 NOTE — Addendum Note (Signed)
Addended by: Amanda Cockayne on: 03/30/2021 10:37 AM   Modules accepted: Orders

## 2021-03-30 NOTE — Addendum Note (Signed)
Addended by: Wyvonne Lenz on: 03/30/2021 11:53 AM   Modules accepted: Orders

## 2021-03-30 NOTE — Progress Notes (Signed)
   Subjective:    Patient ID: KEYSHAUN HOFACKER, male    DOB: 07/05/1941, 80 y.o.   MRN: [id number]  HPI Here for several issues. First he has had a gout attack in both feet that started 5 days ago. The right foot is better now but the left foot is extremely painful. He has tried soaking them in Epsom salts. Also he wants to treat the toenail fungus that we have discussed in the past. Third his Ropinerol no longer helps his restless legs, and he asks to try pramipexole instead.    Review of Systems  Constitutional: Negative.   Respiratory: Negative.    Cardiovascular: Negative.   Musculoskeletal:  Positive for arthralgias.      Objective:   Physical Exam Constitutional:      Comments: In pain   Cardiovascular:     Rate and Rhythm: Normal rate and regular rhythm.     Pulses: Normal pulses.     Heart sounds: Normal heart sounds.  Pulmonary:     Effort: Pulmonary effort is normal.     Breath sounds: Normal breath sounds.  Musculoskeletal:     Comments: The entire left foot is red, swollen, warm ,and very tender   Neurological:     Mental Status: He is alert.          Assessment & Plan:  For the gout, he is given a DepMedrol shot and tomorrow he will start on a Medrol dose pack. For the onychomycosis, he will try Terbinafine. For the restless legs, we will stop Ropinerol and try Pramipexole 0.5 mg TID.We spent 35 minutes reviewing records and discussing these issues.  [name], MD

## 2021-03-31 ENCOUNTER — Ambulatory Visit (INDEPENDENT_AMBULATORY_CARE_PROVIDER_SITE_OTHER): Payer: Medicare Other

## 2021-03-31 DIAGNOSIS — I10 Essential (primary) hypertension: Secondary | ICD-10-CM

## 2021-03-31 DIAGNOSIS — G2581 Restless legs syndrome: Secondary | ICD-10-CM

## 2021-03-31 DIAGNOSIS — M109 Gout, unspecified: Secondary | ICD-10-CM

## 2021-03-31 DIAGNOSIS — E785 Hyperlipidemia, unspecified: Secondary | ICD-10-CM

## 2021-03-31 DIAGNOSIS — I2510 Atherosclerotic heart disease of native coronary artery without angina pectoris: Secondary | ICD-10-CM

## 2021-03-31 DIAGNOSIS — I4892 Unspecified atrial flutter: Secondary | ICD-10-CM

## 2021-03-31 NOTE — Patient Instructions (Signed)
Visit Information  PATIENT GOALS:  Goals Addressed             This Visit's Progress    RNCM:Cope with Chronic Pain   On track    Timeframe:  Long-Range Goal Priority:  Medium Start Date:       02/27/21                      Expected End Date:   07/23/21                    Follow Up Date 05/08/21    - learn relaxation techniques - practice acceptance of chronic pain - practice relaxation or meditation daily - tell myself I can (not I can't) - think of new ways to do favorite things - use distraction techniques - use relaxation during pain    Why is this important?   Stress makes chronic pain feel worse.  Feelings like depression, anxiety, stress and anger can make your body more sensitive to pain.  Learning ways to cope with stress or depression may help you find some relief from the pain.     Notes:      RNCM:Track and Manage My Blood Pressure-Hypertension   On track    Timeframe:  Long-Range Goal Priority:  Medium Start Date:      02/27/21                       Expected End Date:   07/23/21                    Follow Up Date 05/08/21    - check blood pressure 3 times per week - choose a place to take my blood pressure (home, clinic or office, retail store) - write blood pressure results in a log or diary    Why is this important?   You won't feel high blood pressure, but it can still hurt your blood vessels.  High blood pressure can cause heart or kidney problems. It can also cause a stroke.  Making lifestyle changes like losing a little weight or eating less salt will help.  Checking your blood pressure at home and at different times of the day can help to control blood pressure.  If the doctor prescribes medicine remember to take it the way the doctor ordered.  Call the office if you cannot afford the medicine or if there are questions about it.     Notes:      RNCM:Track and Manage Symptoms-Heart Failure   On track    Timeframe:  Long-Range Goal Priority:   High Start Date:       02/27/21                      Expected End Date:    07/23/21                  Follow Up Date 05/08/21    - begin a heart failure diary - develop a rescue plan - eat more whole grains, fruits and vegetables, lean meats and healthy fats - follow rescue plan if symptoms flare-up - know when to call the doctor - track symptoms and what helps feel better or worse - dress right for the weather, hot or cold    Why is this important?   You will be able to handle your symptoms better if you keep track of them.  Making some simple changes to your lifestyle will help.  Eating healthy is one thing you can do to take good care of yourself.    Notes:       Gout Gout is painful swelling of your joints. Gout is a type of arthritis. It is caused by having too much uric acid in your body. Uric acid is a chemical that is made when your body breaks down substances called purines. If your body has too much uric acid, sharp crystals can form and build up in your joints. This causes pain and swelling. Gout attacks can happen quickly and be very painful (acute gout). Over time, the attacks can affect more joints and happen more often (chronic gout). What are the causes? Too much uric acid in your blood. This can happen because: Your kidneys do not remove enough uric acid from your blood. Your body makes too much uric acid. You eat too many foods that are high in purines. These foods include organ meats, some seafood, and beer. Trauma or stress. What increases the risk? Having a family history of gout. Being male and middle-aged. Being male and having gone through menopause. Being very overweight (obese). Drinking alcohol, especially beer. Not having enough water in the body (being dehydrated). Losing weight too quickly. Having an organ transplant. Having lead poisoning. Taking certain medicines. Having kidney disease. Having a skin condition called psoriasis. What are the  signs or symptoms? An attack of acute gout usually happens in just one joint. The most common place is the big toe. Attacks often start at night. Other joints that may be affected include joints of the feet, ankle, knee, fingers, wrist, or elbow. Symptoms of an attack may include: Very bad pain. Warmth. Swelling. Stiffness. Shiny, red, or purple skin. Tenderness. The affected joint may be very painful to touch. Chills and fever. Chronic gout may cause symptoms more often. More joints may be involved. You may also have white or yellow lumps (tophi) on your hands or feet or in other areas near your joints. How is this treated? Treatment for this condition has two phases: treating an acute attack and preventing future attacks. Acute gout treatment may include: NSAIDs. Steroids. These are taken by mouth or injected into a joint. Colchicine. This medicine relieves pain and swelling. It can be given by mouth or through an IV tube. Preventive treatment may include: Taking small doses of NSAIDs or colchicine daily. Using a medicine that reduces uric acid levels in your blood. Making changes to your diet. You may need to see a food expert (dietitian) about what to eat and drink to prevent gout. Follow these instructions at home: During a gout attack  If told, put ice on the painful area: Put ice in a plastic bag. Place a towel between your skin and the bag. Leave the ice on for 20 minutes, 2-3 times a day. Raise (elevate) the painful joint above the level of your heart as often as you can. Rest the joint as much as possible. If the joint is in your leg, you may be given crutches. Follow instructions from your doctor about what you cannot eat or drink. Avoiding future gout attacks Eat a low-purine diet. Avoid foods and drinks such as: Liver. Kidney. Anchovies. Asparagus. Herring. Mushrooms. Mussels. Beer. Stay at a healthy weight. If you want to lose weight, talk with your doctor. Do  not lose weight too fast. Start or continue an exercise plan as told by your doctor. Eating and drinking  Drink enough fluids to keep your pee (urine) pale yellow. If you drink alcohol: Limit how much you use to: 0-1 drink a day for women. 0-2 drinks a day for men. Be aware of how much alcohol is in your drink. In the [place] one drink equals one 12 oz bottle of beer (355 mL), one 5 oz glass of wine (148 mL), or one 1 oz glass of hard liquor (44 mL). General instructions Take over-the-counter and prescription medicines only as told by your doctor. Do not drive or use heavy machinery while taking prescription pain medicine. Return to your normal activities as told by your doctor. Ask your doctor what activities are safe for you. Keep all follow-up visits as told by your doctor. This is important. Contact a doctor if: You have another gout attack. You still have symptoms of a gout attack after 10 days of treatment. You have problems (side effects) because of your medicines. You have chills or a fever. You have burning pain when you pee (urinate). You have pain in your lower back or belly. Get help right away if: You have very bad pain. Your pain cannot be controlled. You cannot pee. Summary Gout is painful swelling of the joints. The most common site of pain is the big toe, but it can affect other joints. Medicines and avoiding some foods can help to prevent and treat gout attacks. This information is not intended to replace advice given to you by your health care provider. Make sure you discuss any questions you have with your health care provider. Document Revised: 01/29/2018 Document Reviewed: 01/29/2018 Elsevier Patient Education  2022 [employer].   Patient verbalizes understanding of instructions provided today and agrees to view in [place].   Telephone follow up appointment with care management team member scheduled for: 05/08/21 at 11 AM  Peter Garter RN, [facility],  CDE Care Management Coordinator [facility] [phone], Mobile [phone]
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
## 2021-04-04 ENCOUNTER — Telehealth: Payer: Self-pay

## 2021-04-04 NOTE — Telephone Encounter (Signed)
Patient called stating he stopped taking the methylPREDNISolone (MEDROL DOSEPAK) 4 MG TBPK tablet and stated B/P 210/190 and Blood glucose read 190 pt would like a call back to discuss.

## 2021-04-05 NOTE — Telephone Encounter (Signed)
Called spoke with patient about message.  Patient said he is currently feeling better, b/p is 158/83.

## 2021-04-05 NOTE — Telephone Encounter (Signed)
I agree with stopping the steroid pack. This made his BP and glucose go up, but these are temporary. Once this is out of his system for a few days, things should settle down

## 2021-04-05 NOTE — Telephone Encounter (Signed)
Patient was seen on 03/30/21, for gout of foot.   Please advise

## 2021-04-06 NOTE — Progress Notes (Signed)
Remote pacemaker transmission.   

## 2021-04-19 ENCOUNTER — Telehealth: Payer: Self-pay | Admitting: Cardiovascular Disease

## 2021-04-19 NOTE — Telephone Encounter (Signed)
Pt c/o of Chest Pain: STAT if CP now or developed within 24 hours  1. Are you having CP right now? YES , FEELS LIKE PULLED MUSCLES ACROSS THE TOP OF HIS BREAST  2. Are you experiencing any other symptoms (ex. SOB, nausea, vomiting, sweating)? ONLY HURTS WHEN PT BREATHES  3. How long have you been experiencing CP? A COUPLE OF DAYS BUT IT IS WORSE NOW  4. Is your CP continuous or coming and going? CONTINUOUS  5. Have you taken Nitroglycerin? NO  ?

## 2021-04-19 NOTE — Telephone Encounter (Signed)
Patient reports CP that started 4 days ago. He woke up w/ CP and couldn't breath.... describes it as "a 2x4 across my chest and something is pushing down" Reports pain as worsening over several days, radiating to back and across chest. SOB Pain 7 out of 10. Pt feels like he has strained something but unsure. Given pt hx and description of CP pt advised to go to nearest ER for evaluation. Pt agreeable to plan.  Will forward to RN to follow up after pt does for ER evaluation.  Pt going to [facility].

## 2021-04-20 ENCOUNTER — Telehealth: Payer: Self-pay

## 2021-04-20 NOTE — Telephone Encounter (Signed)
Called spoke with patient.  He stated he received a message about increasing this Levothyroxine to 265mcg, but that's the currently dosage patient takes.   Labs 03/30/21, please advise.

## 2021-04-20 NOTE — Telephone Encounter (Signed)
Patient called  stating Rx can be sent to [facility] in [place] and that it is ok to call him back.

## 2021-04-21 DIAGNOSIS — I10 Essential (primary) hypertension: Secondary | ICD-10-CM | POA: Diagnosis not present

## 2021-04-21 DIAGNOSIS — E785 Hyperlipidemia, unspecified: Secondary | ICD-10-CM | POA: Diagnosis not present

## 2021-04-21 DIAGNOSIS — I2510 Atherosclerotic heart disease of native coronary artery without angina pectoris: Secondary | ICD-10-CM | POA: Diagnosis not present

## 2021-04-21 NOTE — Telephone Encounter (Signed)
I never said anything about increasing the dose. The levels on 03-30-21 were "normal" so we will keep the dose where it is

## 2021-04-21 NOTE — Telephone Encounter (Signed)
Called spoke with patient, informed of message from MD.   Extremely thankful for reverify with MD.   Voiced understanding nothing further is needed

## 2021-04-24 ENCOUNTER — Ambulatory Visit: Payer: Medicare Other

## 2021-04-24 ENCOUNTER — Telehealth: Payer: Self-pay | Admitting: Pharmacist

## 2021-04-24 NOTE — Chronic Care Management (AMB) (Signed)
Chronic Care Management Pharmacy Assistant   Name: ZAYDON KINSER  MRN: [id number] DOB: 07-13-1941  [name] is an 80 y.o. year old male who presents for his initial CCM visit with the clinical pharmacist.  Reason for Encounter: Chart prep for initial visit with [name] the clinical pharmacist on 04/24/2021   Conditions to be addressed/monitored: CAD, HLD, and GERD, Sleep Apnea and Hypothyroidism   Recent office visits:  08/24/2021 AWV scheduled.  03/30/2021 [name] MD (PCP) - Patient was seen for Acute gout of left foot and additional issues. Started Methylprednisolone 4mg  take as directed, Pramipexole 0.5mg  1 tablet 3 times daily and Terbinafine 250mg  1 tablet daily. Discontinued Augmentin 875-125 mg and Ropinirole 5mg . No follow up noted.  02/15/2021 [name] MD (PCP) - Patient was seen for Chronic narcotic use and additional issues. Referrals to Gastroenterology and Hand Surgery. No medication changes. No follow up noted.  01/04/2021 [name] MD (PCP) - Patient was seen for Bronchitis. Started Augmentin 875/125 mg 1 tablet twice daily and Hydrocodone Bit-Homatrop Mbr 5-1.5 mg / 72ml, 5 mls every 4 hours prn. No follow up noted.  12/26/2020 [name] MD (PCP) - Patient was seen for shortness of breath and additional issues. Discontinued Methylprednisolone 4mg . No follow up noted.  12/23/2020 [name] MD (PCP) - Patient was seen for dyspnea on exertion and additional issues. Started Spironolactone 25mg  daily. Discontinued Furosemide 20mg . No follow up noted.  12/23/2020 [name] MD (PCP) - Patient was seen for penile swelling. Started on methyprednisolone 4mg  as directed. No follow up noted.  11/16/2020 [name] MD (PCP) - Patient was seen for Chronic narcotic use and additional issues. Discontinued Aspirin 81mg  and Clonazepam 1mg . No follow up noted.  Recent consult visits:  04/10/2021 [name] MD ([place]) - Patient was seen  for Sensorineural hearing loss, bilateral. No medication changes. Patient given ENT nurse case manager and ENT appointment scheduling staff. Follow up PRN.  12/27/2020 [name] MD (Cardiology) - Patient was seen for Chronic diastolic CHF and additional issues. Started Torsemide 20mg  daily and discontinued Spironolactone 25mg . Follow up in 12 months.  Hospital visits:  Medication Reconciliation was completed by comparing discharge summary, patient's EMR and Pharmacy list, and upon discussion with patient.  [facility] ED on 12/25/2020 due to Acute on chronic systolic congestive heart failure. Discharge date was 12/25/2020. Time spent at ER was 1 hour.  New?Medications Started at [facility] Discharge:?? -started none  Medication Changes at Hospital Discharge: -Changed none  Medications Discontinued at Hospital Discharge: -Stopped none  Medications that remain the same after Hospital Discharge:??  -All other medications will remain the same.     [facility] on 12/20/2020 due to Dyspnea, uspecified type Discharge date was 12/20/2020. Time spent at ER was 4 hour.  New?Medications Started at [facility] Discharge:?? -started Furosemide 20mg  daily.  Medication Changes at Hospital Discharge: -Changed none  Medications Discontinued at Hospital Discharge: -Stopped none  Medications that remain the same after Hospital Discharge:??  -All other medications will remain the same.   Medications: Outpatient Encounter Medications as of 04/24/2021  Medication Sig   apixaban (ELIQUIS) 5 MG TABS tablet Take 1 tablet (5 mg total) by mouth 2 (two) times daily.   Ascorbic Acid (VITAMIN C PO) Take 4 tablets by mouth daily as needed (flu like symptopms).    diclofenac sodium (VOLTAREN) 1 % GEL Apply 1 application topically daily as needed (ankle pain).   diltiazem (CARDIZEM CD) 120 MG  24 hr capsule TAKE 1 CAPSULE BY MOUTH EVERY DAY   fluticasone (FLONASE) 50 MCG/ACT nasal  spray Place 1 spray into both nostrils daily as needed for allergies or rhinitis.   gabapentin (NEURONTIN) 100 MG capsule TAKE 1 CAPSULE BY MOUTH THREE TIMES A DAY   HYDROcodone bit-homatropine (HYCODAN) 5-1.5 MG/5ML syrup Take 5 mLs by mouth every 4 (four) hours as needed.   levothyroxine (SYNTHROID) 200 MCG tablet Take 1 tablet (200 mcg total) by mouth daily.   methylPREDNISolone (MEDROL DOSEPAK) 4 MG TBPK tablet As directed   nitroGLYCERIN (NITROSTAT) 0.4 MG SL tablet Place 1 tablet (0.4 mg total) under the tongue every 5 (five) minutes as needed for chest pain.   omeprazole (PRILOSEC) 40 MG capsule Take 1 capsule (40 mg total) by mouth daily.   oxycodone (ROXICODONE) 30 MG immediate release tablet Take 1 tablet (30 mg total) by mouth every 6 (six) hours as needed for pain.   pramipexole (MIRAPEX) 0.5 MG tablet Take 1 tablet (0.5 mg total) by mouth 3 (three) times daily.   terbinafine (LAMISIL) 250 MG tablet Take 1 tablet (250 mg total) by mouth daily.   torsemide (DEMADEX) 20 MG tablet Take 1 tablet (20 mg total) by mouth daily.   No facility-administered encounter medications on file as of 04/24/2021.   Fill History: FLUTICASONE PROP 50 MCG SPRAY 04/20/2021 90   GABAPENTIN 100 MG CAPSULE 04/21/2021 30   LEVOTHYROXINE SODIUM  200 MCG TABS 03/06/2021 90     NITROGLYCERIN 0.4 MG TABLET SL 05/24/2020 8   PRAMIPEXOLE 0.5 MG TABLET 03/30/2021 90   TERBINAFINE HYDROCHLORIDE  250 MG TABS 04/22/2021 30   TORSEMIDE 20 MG TABLET 04/21/2021 90   DILTIAZEM 24H ER(CD) 120 MG CP 06/09/2020 90   OXYCODONE HCL (IR) 30 MG TAB 04/21/2021 30   ROPINIROLE HCL 5 MG TABLET 03/08/2021 90   Have you seen any other providers since your last visit? **no  Any changes in your medications or health? No states he is feeling better and getting more sleep.  Any side effects from any medications? Yes, he had a reaction to Medrol, he doesn't remember the symptoms however, Dr. Sarajane Jews advised him to discontinue.    Do you have an symptoms or problems not managed by your medications? no  Any concerns about your health right now? No, he states he is feeling better than he has in a long time.  Has your provider asked that you check blood pressure, blood sugar, or follow special diet at home? Yes, Patient is checking his blood pressures every other day, his last readings have been around 130/72 for the last few weeks.  He generally eats what he wants, Breakfast-eggs, bacon and hashbrowns, he doesn't eat lunch and dinner is subway or a buffet where he will eat meat, vegetable and potato. He does like to have icecream for a snack.  Do you get any type of exercise on a regular basis? 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  Can you think of a goal you would like to reach for your health? Nothing he can discuss with the ladies.  Do you have any problems getting your medications? no  Is there anything that you would like to discuss during the appointment? Nothing he can think of at this time.  Please bring medications, blood pressure cuff and supplements to appointment  Note:  Patient states he has added Magnesium and Vitamin B12, he feels they are helping  him sleep and feel better.  He is no longer taking Hycodan and Medrol which are still on his med list.  Care Gaps: AWV - scheduled 08/24/2021 Covid 19 vaccine - never done Urine Microalbumin - never done Hep C Screen - never done Flu vaccine - due  Star Rating Drugs: None  [place] [phone]

## 2021-05-01 NOTE — Telephone Encounter (Signed)
Called to follow up on previous call.  Pt did not present to ER for chest pain.  He states he is feeling much better and that he had pulled a muscle.  States he has an appointment w surgery to discuss having 2 hernia surgeries on 05/08/21. Remains scheduled w Dr. [name] in December.   Pt thanked me for the call.

## 2021-05-08 ENCOUNTER — Ambulatory Visit (INDEPENDENT_AMBULATORY_CARE_PROVIDER_SITE_OTHER): Payer: Medicare Other

## 2021-05-08 DIAGNOSIS — I10 Essential (primary) hypertension: Secondary | ICD-10-CM

## 2021-05-08 DIAGNOSIS — G2581 Restless legs syndrome: Secondary | ICD-10-CM

## 2021-05-08 DIAGNOSIS — M5440 Lumbago with sciatica, unspecified side: Secondary | ICD-10-CM

## 2021-05-08 DIAGNOSIS — I2510 Atherosclerotic heart disease of native coronary artery without angina pectoris: Secondary | ICD-10-CM

## 2021-05-08 DIAGNOSIS — E785 Hyperlipidemia, unspecified: Secondary | ICD-10-CM

## 2021-05-08 DIAGNOSIS — I4892 Unspecified atrial flutter: Secondary | ICD-10-CM
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
## 2021-05-08 NOTE — Patient Instructions (Signed)
Visit Information  PATIENT GOALS:  Goals Addressed             This Visit's Progress    RNCM:Cope with Chronic Pain   On track    Timeframe:  Long-Range Goal Priority:  Medium Start Date:       02/27/21                      Expected End Date:   07/23/21                    Follow Up Date 06/09/21    - learn relaxation techniques - practice acceptance of chronic pain - practice relaxation or meditation daily - tell myself I can (not I can't) - think of new ways to do favorite things - use distraction techniques - use relaxation during pain    Why is this important?   Stress makes chronic pain feel worse.  Feelings like depression, anxiety, stress and anger can make your body more sensitive to pain.  Learning ways to cope with stress or depression may help you find some relief from the pain.     Notes:      RNCM:Track and Manage My Blood Pressure-Hypertension   On track    Timeframe:  Long-Range Goal Priority:  Medium Start Date:      02/27/21                       Expected End Date:   07/23/21                    Follow Up Date 06/09/21    - check blood pressure 3 times per week - choose a place to take my blood pressure (home, clinic or office, retail store) - write blood pressure results in a log or diary  -take log of B/P readings to provider visit   Why is this important?   You won't feel high blood pressure, but it can still hurt your blood vessels.  High blood pressure can cause heart or kidney problems. It can also cause a stroke.  Making lifestyle changes like losing a little weight or eating less salt will help.  Checking your blood pressure at home and at different times of the day can help to control blood pressure.  If the doctor prescribes medicine remember to take it the way the doctor ordered.  Call the office if you cannot afford the medicine or if there are questions about it.     Notes:      RNCM:Track and Manage Symptoms-Heart Failure   On track     Timeframe:  Long-Range Goal Priority:  High Start Date:       02/27/21                      Expected End Date:    07/23/21                  Follow Up Date 06/09/21    - begin a heart failure diary - develop a rescue plan - eat more whole grains, fruits and vegetables, lean meats and healthy fats - follow rescue plan if symptoms flare-up - know when to call the doctor - track symptoms and what helps feel better or worse - dress right for the weather, hot or cold    Why is this important?   You will be able to handle your  symptoms better if you keep track of them.  Making some simple changes to your lifestyle will help.  Eating healthy is one thing you can do to take good care of yourself.    Notes:       Gout Gout is painful swelling of your joints. Gout is a type of arthritis. It is caused by having too much uric acid in your body. Uric acid is a chemical that is made when your body breaks down substances called purines. If your body has too much uric acid, sharp crystals can form and build up in your joints. This causes pain and swelling. Gout attacks can happen quickly and be very painful (acute gout). Over time, the attacks can affect more joints and happen more often (chronic gout). What are the causes? Too much uric acid in your blood. This can happen because: Your kidneys do not remove enough uric acid from your blood. Your body makes too much uric acid. You eat too many foods that are high in purines. These foods include organ meats, some seafood, and beer. Trauma or stress. What increases the risk? Having a family history of gout. Being male and middle-aged. Being male and having gone through menopause. Being very overweight (obese). Drinking alcohol, especially beer. Not having enough water in the body (being dehydrated). Losing weight too quickly. Having an organ transplant. Having lead poisoning. Taking certain medicines. Having kidney disease. Having a skin  condition called psoriasis. What are the signs or symptoms? An attack of acute gout usually happens in just one joint. The most common place is the big toe. Attacks often start at night. Other joints that may be affected include joints of the feet, ankle, knee, fingers, wrist, or elbow. Symptoms of an attack may include: Very bad pain. Warmth. Swelling. Stiffness. Shiny, red, or purple skin. Tenderness. The affected joint may be very painful to touch. Chills and fever. Chronic gout may cause symptoms more often. More joints may be involved. You may also have white or yellow lumps (tophi) on your hands or feet or in other areas near your joints. How is this treated? Treatment for this condition has two phases: treating an acute attack and preventing future attacks. Acute gout treatment may include: NSAIDs. Steroids. These are taken by mouth or injected into a joint. Colchicine. This medicine relieves pain and swelling. It can be given by mouth or through an IV tube. Preventive treatment may include: Taking small doses of NSAIDs or colchicine daily. Using a medicine that reduces uric acid levels in your blood. Making changes to your diet. You may need to see a food expert (dietitian) about what to eat and drink to prevent gout. Follow these instructions at home: During a gout attack  If told, put ice on the painful area: Put ice in a plastic bag. Place a towel between your skin and the bag. Leave the ice on for 20 minutes, 2-3 times a day. Raise (elevate) the painful joint above the level of your heart as often as you can. Rest the joint as much as possible. If the joint is in your leg, you may be given crutches. Follow instructions from your doctor about what you cannot eat or drink. Avoiding future gout attacks Eat a low-purine diet. Avoid foods and drinks such as: Liver. Kidney. Anchovies. Asparagus. Herring. Mushrooms. Mussels. Beer. Stay at a healthy weight. If you want to  lose weight, talk with your doctor. Do not lose weight too fast. Start or continue an exercise  plan as told by your doctor. Eating and drinking Drink enough fluids to keep your pee (urine) pale yellow. If you drink alcohol: Limit how much you use to: 0-1 drink a day for women. 0-2 drinks a day for men. Be aware of how much alcohol is in your drink. In the [place] one drink equals one 12 oz bottle of beer (355 mL), one 5 oz glass of wine (148 mL), or one 1 oz glass of hard liquor (44 mL). General instructions Take over-the-counter and prescription medicines only as told by your doctor. Do not drive or use heavy machinery while taking prescription pain medicine. Return to your normal activities as told by your doctor. Ask your doctor what activities are safe for you. Keep all follow-up visits as told by your doctor. This is important. Contact a doctor if: You have another gout attack. You still have symptoms of a gout attack after 10 days of treatment. You have problems (side effects) because of your medicines. You have chills or a fever. You have burning pain when you pee (urinate). You have pain in your lower back or belly. Get help right away if: You have very bad pain. Your pain cannot be controlled. You cannot pee. Summary Gout is painful swelling of the joints. The most common site of pain is the big toe, but it can affect other joints. Medicines and avoiding some foods can help to prevent and treat gout attacks. This information is not intended to replace advice given to you by your health care provider. Make sure you discuss any questions you have with your health care provider. Document Revised: 01/29/2018 Document Reviewed: 01/29/2018 Elsevier Patient Education  2022 [employer].   Patient verbalizes understanding of instructions provided today and agrees to view in [place].   Telephone follow up appointment with care management team member scheduled for: 06/09/21 at [place] RN, [facility], CDE Care Management Coordinator [facility] [phone], Mobile [phone]

## 2021-05-10 ENCOUNTER — Ambulatory Visit: Payer: Self-pay | Admitting: General Surgery

## 2021-05-10 ENCOUNTER — Telehealth: Payer: Self-pay | Admitting: *Deleted

## 2021-05-10 DIAGNOSIS — I4891 Unspecified atrial fibrillation: Secondary | ICD-10-CM | POA: Insufficient documentation

## 2021-05-10 DIAGNOSIS — K429 Umbilical hernia without obstruction or gangrene: Secondary | ICD-10-CM | POA: Diagnosis not present

## 2021-05-10 DIAGNOSIS — H9313 Tinnitus, bilateral: Secondary | ICD-10-CM | POA: Insufficient documentation

## 2021-05-10 DIAGNOSIS — K4090 Unilateral inguinal hernia, without obstruction or gangrene, not specified as recurrent: Secondary | ICD-10-CM | POA: Diagnosis not present

## 2021-05-10 DIAGNOSIS — H903 Sensorineural hearing loss, bilateral: Secondary | ICD-10-CM | POA: Insufficient documentation

## 2021-05-10 DIAGNOSIS — Z961 Presence of intraocular lens: Secondary | ICD-10-CM | POA: Insufficient documentation

## 2021-05-10 NOTE — Telephone Encounter (Signed)
Left voice message on 05/10/2021 at 1345.  Patient needs call back.

## 2021-05-10 NOTE — Telephone Encounter (Signed)
Patient with diagnosis of A Fib on Eliquis for anticoagulation.    Procedure: LAPAROSCOPIC VENTRAL HERNIA REPAIR  WITH MESH Date of procedure: TBD   CHA2DS2-VASc Score = 4  This indicates a 4.8% annual risk of stroke. The patient's score is based upon: CHF History: 0 HTN History: 1 Diabetes History: 0 Stroke History: 0 Vascular Disease History: 1 Age Score: 2 Gender Score: 0    CrCl 78 mL/min Platelet count 281K  Per office protocol, patient can hold Eliquis for 2 days prior to procedure.

## 2021-05-10 NOTE — H&P (Signed)
Chief Complaint: No chief complaint on file.       History of Present Illness: [name] is a 80 y.o. male who is seen today as an office consultation at the request of Dr. [name] for evaluation of No chief complaint on file. .   Patient is a 80 year old male who previously had been seen secondary to a left inguinal hernia. Patient comes in today secondary to left inguinal hernia, scrotal mass less likely secondary to hernia that seen on CT scan.  Patient states he has significant pain to the area.   Patient also has an umbilical hernia from previous laparoscopic [place]. Patient would like to have both hernias repaired.   I did review the CT scan which does show a small left inguinal hernia.  This does not extend down to the scrotum.  He does have a mass in that area.       Review of Systems: A complete review of systems was obtained from the patient.  I have reviewed this information and discussed as appropriate with the patient.  See HPI as well for other ROS.   Review of Systems  Constitutional: Negative for fever.  HENT: Negative for congestion.   Eyes: Negative for blurred vision.  Respiratory: Negative for cough, shortness of breath and wheezing.   Cardiovascular: Negative for chest pain and palpitations.  Gastrointestinal: Negative for heartburn.  Genitourinary: Negative for dysuria.  Musculoskeletal: Negative for myalgias.  Skin: Negative for rash.  Neurological: Negative for dizziness and headaches.  Psychiatric/Behavioral: Negative for depression and suicidal ideas.  All other systems reviewed and are negative.       Medical History: Past Medical History Past Medical History: Diagnosis Date  GERD (gastroesophageal reflux disease)    Heart valve disease    Sleep apnea    Thyroid disease        There is no problem list on file for this patient.     Past Surgical History Past Surgical History: Procedure Laterality Date  APPENDECTOMY      gallbladder  surgery      TONSILLECTOMY          Allergies Allergies Allergen Reactions  Metoprolol Other (See Comments) and Palpitations     Severe chest pains " flat lined patient"    Pramipexole Other (See Comments)     Severe leg pain    Shellfish Containing Products Other (See Comments), Anaphylaxis and Hives  Ticagrelor Shortness Of Breath  Zolpidem Other (See Comments)     Chest pain    Furosemide Hives and Itching  Trazodone Other (See Comments)  Levofloxacin Hives      Current Outpatient Medications on File Prior to Visit Medication Sig Dispense Refill  apixaban (ELIQUIS) 5 mg tablet TAKE ONE TABLET BY MOUTH TWICE A DAY (CAUTION: BLOOD THINNER) STOP ASPIRIN      diltiazem (CARDIZEM CD) 120 MG XR capsule diltiazem CD 120 mg capsule,extended release 24 hr      fluticasone propionate (FLONASE) 50 mcg/actuation nasal spray fluticasone propionate 50 mcg/actuation nasal spray,suspension      gabapentin (NEURONTIN) 100 MG capsule Take 1 capsule by mouth once daily      pramipexole (MIRAPEX) 0.5 MG tablet Take 0.5 mg by mouth 3 (three) times daily      ascorbic acid, vitamin C, 550 mg/1.1 gram (scoop) Powd Take by mouth      EUA PAXLOVID 300 mg (150 mg x 2)-100 mg tablet USE AS DIRECTED PER INSTRUCTIONS ON PACKAGE FOR 5 DAYS  hydrocodone-homatropine (HYCODAN) 5-1.5 mg/5 mL syrup TAKE 5 MILLILITERS BY MOUTH EVERY 4 HOURS AS NEEDED      levothyroxine (SYNTHROID) 100 MCG tablet levothyroxine 100 mcg tablet  TAKE 1 TABLET BY MOUTH EVERY DAY BEFORE BREAKFAST      methylPREDNISolone (MEDROL DOSEPACK) 4 mg tablet TAKE 6 TABLETS ON DAY 1 AS DIRECTED ON PACKAGE AND DECREASE BY 1 TAB EACH DAY FOR A TOTAL OF 6 DAYS      nitroGLYcerin (NITROSTAT) 0.4 MG SL tablet Place 0.4 mg under the tongue every 5 (five) minutes as needed      omeprazole (PRILOSEC) 20 MG DR capsule omeprazole 20 mg capsule,delayed release      oxyCODONE (DAZIDOX) 20 mg immediate release tablet oxycodone 20 mg tablet       terbinafine HCL (LAMISIL) 250 mg tablet Take 250 mg by mouth once daily      TORsemide (DEMADEX) 20 MG tablet Take 20 mg by mouth once daily       No current facility-administered medications on file prior to visit.     Family History Family History Problem Relation Age of Onset  Diabetes Mother    Stroke Father    High blood pressure (Hypertension) Father    Hyperlipidemia (Elevated cholesterol) Father    Heart valve disease Father        Social History   Tobacco Use Smoking Status Never Smoker Smokeless Tobacco Never Used     Social History Social History    Socioeconomic History  Marital status: Married Tobacco Use  Smoking status: Never Smoker  Smokeless tobacco: Never Used Substance and Sexual Activity  Alcohol use: Never  Drug use: Never      Objective:     Vitals:   05/10/21 1042 BP: 132/84 Pulse: 70 Temp: 36.7 C (98.1 F) SpO2: 96% Weight: 97.1 kg (214 lb) Height: 175.3 cm (5\' 9" )   Body mass index is 31.6 kg/m.   Physical Exam Constitutional:      Appearance: Normal appearance.  HENT:     Head: Normocephalic and atraumatic.     Nose: Nose normal. No congestion.     Mouth/Throat:     Mouth: Mucous membranes are moist.     Pharynx: Oropharynx is clear.  Eyes:     Pupils: Pupils are equal, round, and reactive to light.  Cardiovascular:     Rate and Rhythm: Normal rate and regular rhythm.     Pulses: Normal pulses.     Heart sounds: Normal heart sounds. No murmur heard.   No friction rub. No gallop.  Pulmonary:     Effort: Pulmonary effort is normal. No respiratory distress.     Breath sounds: Normal breath sounds. No stridor. No wheezing, rhonchi or rales.  Abdominal:     General: Abdomen is flat.     Hernia: A hernia is present. Hernia is present in the left inguinal area.     Comments: .  Left scrotal mass likely secondary to extending hernia  Musculoskeletal:        General: Normal range of motion.     Cervical back: Normal  range of motion.  Skin:    General: Skin is warm and dry.  Neurological:     General: No focal deficit present.     Mental Status: He is alert and oriented to person, place, and time.  Psychiatric:        Mood and Affect: Mood normal.        Thought Content: Thought content normal.  Assessment and Plan: Diagnoses and all orders for this visit:   Umbilical hernia without obstruction or gangrene   Unilateral inguinal hernia without obstruction or gangrene, recurrence not specified   Will obtain an ultrasound of his scrotum to evaluate the mass in his left hemiscrotum.  Patient also will need clearance from Dr. [name] to be off Eliquis and with his pacemaker.     [name] is a 80 y.o. male    1.  We will proceed to the OR for a lap ventral hernia repair with mesh and left inguinal hernia repair with mesh. 2. All risks and benefits were discussed with the patient, to generally include infection, bleeding, damage to surrounding structures, acute and chronic nerve pain, and recurrence. Alternatives were offered and described.  All questions were answered and the patient voiced understanding of the procedure and wishes to proceed at this point.             No follow-ups on file.   Ralene Ok, MD, [facility], [facility]

## 2021-05-10 NOTE — Telephone Encounter (Signed)
   Pre-operative Risk Assessment    Patient Name: [name]  DOB: 1940/08/24 MRN: [id number]      Request for Surgical Clearance   Procedure:   LAPAROSCOPIC VENTRAL HERNIA REPAIR  WITH MESH TBD Date of Surgery: Clearance TBD                                 Surgeon:  DR. Ralene Ok Surgeon's Group or Practice Name:  [facility] Phone number:  [phone] Fax number:  [phone] ATTN Lindwood Coke, RN   Type of Clearance Requested: - Medical  - Pharmacy:  Hold Apixaban (Eliquis)     Type of Anesthesia:   General    Additional requests/questions: DR. Powers   05/10/2021, 12:07 PM

## 2021-05-11 NOTE — Telephone Encounter (Signed)
   Primary Cardiologist: [name], MD  Chart reviewed as part of pre-operative protocol coverage. Given past medical history and time since last visit, based on ACC/AHA guidelines, AREON COCUZZA would be at acceptable risk for the planned procedure without further cardiovascular testing.   Patient with diagnosis of A Fib on Eliquis for anticoagulation.     Procedure: LAPAROSCOPIC VENTRAL HERNIA REPAIR  WITH MESH Date of procedure: TBD     CHA2DS2-VASc Score = 4  This indicates a 4.8% annual risk of stroke. The patient's score is based upon: CHF History: 0 HTN History: 1 Diabetes History: 0 Stroke History: 0 Vascular Disease History: 1 Age Score: 2 Gender Score: 0      CrCl 78 mL/min Platelet count 281K   Per office protocol, patient can hold Eliquis for 2 days prior to procedure.  Patient was advised that if he develops new symptoms prior to surgery to contact our office to arrange a follow-up appointment.  He verbalized understanding.  I will route this recommendation to the requesting party via Epic fax function and remove from pre-op pool.  Please call with questions.  Jossie Ng. Winferd Wease NP-C    05/11/2021, 9:04 AM [address] Office [phone] Fax [phone]

## 2021-05-11 NOTE — Telephone Encounter (Signed)
Patient is returning Ramona call.

## 2021-05-12 ENCOUNTER — Telehealth: Payer: Self-pay | Admitting: Pharmacist

## 2021-05-12 NOTE — Chronic Care Management (AMB) (Signed)
    Chronic Care Management Pharmacy Assistant   Name: [name]  MRN: [id number] DOB: 1941-05-24  05/15/2021 APPOINTMENT REMINDER   [name] was reminded to have all medications, supplements and any blood glucose and blood pressure readings available for review with [name], Pharm. D, at his office visit on 05/15/2021 at 3:30.   Questions: Have you had any recent office visit or specialist visit outside of [place]? Yes, he has been seen at the [place], he gets Eliquis filled at the [place]  Are there any concerns you would like to discuss during your office visit? No  Are you having any problems obtaining your medications? (Whether it pharmacy issues or cost) Yes, short on money sometimes and states he keeps forgetting when to take them.  If patient has any PAP medications ask if they are having any problems getting their PAP medication or refill? No  Care Gaps: AWV - scheduled 08/24/2021 Covid 19 vaccine - never done Urine Microalbumin - never done Hep C Screen - never done Flu vaccine - due Last BP - 132/84 on 05/10/2021  Star Rating Drug: None  Any gaps in medications fill history?No  [place]  Catering manager [phone]

## 2021-05-15 ENCOUNTER — Ambulatory Visit: Payer: Medicare Other | Admitting: Pharmacist

## 2021-05-15 ENCOUNTER — Other Ambulatory Visit: Payer: Self-pay

## 2021-05-15 VITALS — BP 144/80

## 2021-05-15 DIAGNOSIS — E785 Hyperlipidemia, unspecified: Secondary | ICD-10-CM

## 2021-05-15 DIAGNOSIS — I10 Essential (primary) hypertension: Secondary | ICD-10-CM

## 2021-05-15 NOTE — Patient Instructions (Addendum)
Hi Emmert,  It was great to get to meet you in person! Below is a summary of some of the topics we discussed.   Here is the [facility] phone number: [phone]. Please call them to call them if you want an outside opinion on Medicare plans.  Please reach out to me if you have any questions or need anything before our follow up!  Best, Maddie  [name], PharmD, [place] Pharmacist [facility] [place]     Visit Information   Goals Addressed   None    Patient Care Plan: RNCM:Cardiovascular disease Management (HF,CAD,AF,HTN and HLD)     Problem Identified: Lack of long term managment of Cardiovascular disease (HF,CAD,AF,HTN and HLD)   Priority: High     Long-Range Goal: Effective long term Cardiovascular disease Management (HF,CAD,AF,HTN and HLD)   Start Date: 02/27/2021  Expected End Date: 07/23/2021  This Visit's Progress: On track  Recent Progress: On track  Priority: High  Note:   Current Barriers:  Knowledge deficits related to basic Cardiovascular disease Management (HF,CAD,AF,HTN and HLD) pathophysiology and self care management Unable to independently Self manage Cardiovascular disease (HF,CAD,AF,HTN and HLD) Does not adhere to provider recommendations re:  Does not adhere to prescribed medication regimen Financial strain Pt states he sometimes does not take his fluid pill if he is going out.  States he had some chest pain that was a pulled muscle when he over did it.   States he has had some swelling in his legs from his gout.  States he gets short of breath with exertion but it has not increased.  States he is walking with his lady friend at [employer] for 30-60 minutes most days  States he has been trying to  eat more healthy as his friend cooks healthy.  States he usually weights every day and he weighted 207 yesterday.  States his B/P has been good with his last reading 128/70.   Nurse Case Manager Clinical Goal(s):  patient will  weigh self daily and record patient will verbalize understanding of Heart Failure Action Plan and when to call doctor patient will take all Heart Failure mediations as prescribed Interventions:  Collaboration with [name], MD regarding development and update of comprehensive plan of care as evidenced by provider attestation and co-signature Inter-disciplinary care team collaboration (see longitudinal plan of care) Reviewed basic overview and discussion of pathophysiology of Heart Failure Reinforced to follow a low sodium diet and to not add salt to his watermelon or cantaloupe  Reviewed Heart Failure Action Plan  Assessed for scales in home-has scales Reinforced importance of daily weight Reinforced role of diuretics in prevention of fluid overload Reinforced education to patient re: stroke prevention, s/s of heart attack and stroke, DASH diet, complications of uncontrolled blood pressure Advised patient, providing education and rationale, to monitor blood pressure 3 times a week and record, calling PCP for findings outside established parameters Reviewed to keep appointment with cardiology on 06/28/21 Pharmacy referral for issues with adherence and knowledge of medications -scheduled to see CCM PharmD 05/15/21  Reinforced to use his arm B/P monitor instead of the wrist monitor to get more accurate readings  Reviewed to take a log of his B/P readings and B/P monitor to his appointment with CCM PharmD Encouraged to continue walking but to pace activity  Self-Care Activities:  Takes Heart Failure Medications as prescribed Weighs daily and record (notifying MD of 3 lb weight gain over night or 5 lb in a week) [employer]  understanding of and follows CHF Action Plan Adheres to low sodium diet  Patient Goals:  - Take Heart Failure Medications as prescribed - Weigh daily and record (notify MD with 3 lb weight gain over night or 5 lb in a week) - Follow CHF Action Plan - Adhere to low  sodium diet - develop a rescue plan - eat more whole grains, fruits and vegetables, lean meats and healthy fats - follow rescue plan if symptoms flare-up - know when to call the doctor - track symptoms and what helps feel better or worse - dress right for the weather, hot or cold - avoid heavy exercise on very hot days - pace activity allowing for rest - call office if I gain more than 2 pounds in one day or 5 pounds in one week - keep legs up while sitting - track weight in diary - use salt in moderation - watch for swelling in feet, ankles and legs every day - weigh myself daily - check blood pressure 3 times per week - choose a place to take my blood pressure (home, clinic or office, retail store) - write blood pressure results in a log or diary Follow Up Plan: Telephone follow up appointment with care management team member scheduled for: 06/09/21 at 9 AM The patient has been provided with contact information for the care management team and has been advised to call with any health related questions or concerns.      Patient Care Plan: RNCM:Chronic Pain (Adult)     Problem Identified: Chronic Pain Management (Chronic Pain)      Long-Range Goal: Chronic Pain Managed   Start Date: 02/27/2021  Expected End Date: 07/23/2021  This Visit's Progress: On track  Recent Progress: On track  Priority: Medium  Note:   Current Barriers:  Knowledge Deficits related to self-health management of acute or chronic pain lower back and restless leg syndrome Chronic Disease Management support and education needs related to chronic pain Knowledge Deficits related to self management of chronic lower back pain Chronic Disease Management support and education needs related to self management of chronic lower back pain Unable to independently self management of chronic lower back pain States he his chronic pain still gets worse as the day goes by.   States his restless leg syndrome is an issues and is  not sure if medication has helped.  States he takes his pain medication in the evening which helps some.  Clinical Goal(s):  patient will verbalize understanding of plan for pain management. , patient will meet with RN Care Manager to address self management of chronic lower back pain, patient will attend all scheduled medical appointments: labs CCM PharmD 05/15/21, Dr. [name] 06/28/21, patient will demonstrate use of different relaxation  skills and/or diversional activities to assist with pain reduction (distraction, imagery, relaxation, massage, acupressure, TENS, heat, and cold application., patient will report pain at a level less than 3 to 4 on a 10-10 rating scale., patient will use pharmacological and nonpharmacological pain relief strategies as prescribed. , and patient will verbalize acceptable level of pain relief and ability to engage in desired activities Interventions:  Collaboration with [name], MD regarding development and update of comprehensive plan of care as evidenced by provider attestation and co-signature Pain assessment performed Medications reviewed Discussed plans with patient for ongoing care management follow up and provided patient with direct contact information for care management team Evaluation of current treatment plan related to self management of chronic lower back pain  and patient's adherence to plan as established by provider. Reinforced  education to patient re: self management of chronic lower back pain Reviewed medications with patient and discussed adherence Reviewed scheduled/upcoming provider appointments including: CCM PharmD 05/15/21, Dr. [name] 06/28/21, Pharmacy referral for adherence and knowledge issues-to see CCM PharmD on 05/15/21 Reviewed to pace his activities  Reviewed foods to watch to help prevent gout Patient Goals/Self Care Activities:  Will self-administer medications as prescribed Will attend all scheduled provider  appointments Will call pharmacy for medication refills 7 days prior to needed refill date Patient will calls provider office for new concerns or questions Follow Up Plan: Telephone follow up appointment with care management team member scheduled for: 06/15/21 at 9 AM The patient has been provided with contact information for the care management team and has been advised to call with any health related questions or concerns.       Patient Care Plan: LCSW Plan of Care     Problem Identified: Track and Manage My Symptoms of Grief and Depression. Resolved 03/20/2021  Priority: High     Goal: Track and Manage My Symptoms of Grief and Depression. Completed 03/20/2021  Start Date: 03/20/2021  Expected End Date: 03/20/2021  This Visit's Progress: On track  Priority: High  Note:   Current Barriers:   Acute Mental Health needs related to CAD, HF, HTN, HLD, AFib, Chronic Low Back Pain, Depression with Anxiety and Grief. Needs Support, Education, and Care Coordination in order to meet unmet mental health needs. Clinical Goal(s):  Patient will work with LCSW to reduce and manage symptoms of Depression with Anxiety and Grief.   Patient will increase knowledge and/or ability of:        Coping Skills, Healthy Habits, Self-Management Skills, Stress Reduction, Home Safety and Utilizing [employer] and Resources.   Clinical Interventions:  Assessed patient's previous treatment, needs, coping skills, current treatment, support system and barriers to care. PHQ-2 and PHQ-9 Depression Screening Tool performed and results reviewed with patient. Other interventions included:       Solution-Focused Therapy Performed, Mindfulness Meditation Strategies, Relaxation Techniques and Deep Breathing Exercises Encouraged, Active Listening/       Reflection Utilized, Emotional Support Provided, Problem Solving Saline, Psychoeducation /Health Education, Motivational        Interviewing,  Brief Cognitive Behavioral Therapy Initiated, Reviewed Mental Health Medications and Discussed Compliance, Quality of Sleep Assessed and       Sleep Hygiene Techniques Promoted, Support Group Participation Encouraged, Increase Level of Activity/Exercise, Verbalization of Feelings Encouraged,        Crisis Resource Education/Information Provided, Suicidal Ideation/Homicidal Ideation Assessed - None Present.   Patient interviewed and appropriate assessments performed. Provided mental health counseling with regards to Depression with Anxiety and Grief.   Discussed several options for long-term counseling based on need and insurance.  Collaboration with Primary Care Physician, Dr. [name] regarding development and update of comprehensive plan of care as evidenced by provider attestation and co-signature. Inter-disciplinary care team collaboration (see longitudinal plan of care). Patient Goals/Self-Care Activities: Consider self-enrollment in a grief and loss support group.  Incorporate into daily practice - relaxation techniques, deep breathing exercises and mindfulness meditation strategies. Contact LCSW directly (# [id number]) if you change your mind about wanting to receive social work services and resources, or if additional social work needs are identified in the near future. Follow-Up:  No Follow-Up Required, Per Patient.     Patient Care Plan: [place]  Problem Identified: Problem: Hypertension, Hyperlipidemia, Atrial Fibrillation, Coronary Artery Disease, GERD, and Gout      Long-Range Goal: Patient-Specific Goal   Start Date: 05/15/2021  Expected End Date: 05/15/2022  This Visit's Progress: On track  Priority: High  Note:   Current Barriers:  Unable to independently monitor therapeutic efficacy Unable to achieve control of cholesterol   Pharmacist Clinical Goal(s):  Patient will achieve adherence to monitoring guidelines and medication adherence to achieve  therapeutic efficacy achieve control of cholesterol as evidenced by next lipid panel  through collaboration with PharmD and provider.   Interventions: 1:1 collaboration with [name], MD regarding development and update of comprehensive plan of care as evidenced by provider attestation and co-signature Inter-disciplinary care team collaboration (see longitudinal plan of care) Comprehensive medication review performed; medication list updated in electronic medical record  Hypertension (BP goal <140/90) -Controlled -Current treatment: Diltiazem 120 mg 1 capsule daily Torsemide 20 mg 1 tablet daily -Medications previously tried: n/a  -Current home readings: 155/65, 114/73, 132/99, 140/68, 123/65, 116/76, 134/84 -Current dietary habits: trying to eat healthier -Current exercise habits: limited by back pain -Denies hypotensive/hypertensive symptoms -Educated on Exercise goal of 150 minutes per week; Importance of home blood pressure monitoring; Proper BP monitoring technique; -Counseled to monitor BP at home weekly, document, and provide log at future appointments -Counseled on diet and exercise extensively Recommended to continue current medication  Swelling (Goal: minimize fluid retention) -Controlled -Current treatment  Torsemide 20 mg 1 tablet daily -Medications previously tried: none  -Recommended to continue current medication   Hyperlipidemia: (LDL goal < 70) -Uncontrolled -Current treatment: No medication -Medications previously tried: statins (myalgias)  -Current dietary patterns: eating out some but trying to each healthier -Current exercise habits: none -Educated on Cholesterol goals;  Benefits of statin for ASCVD risk reduction; Importance of limiting foods high in cholesterol; -Counseled on diet and exercise extensively Recommended repeat lipid panel and initiation of cholesterol therapy.  CAD (Goal: prevent heart events) -Uncontrolled -Current treatment   Nitroglycerin 0.4 mg 1 tablet as needed -Medications previously tried: none  -Recommended to continue current medication Recommended verifying expiration date on nitroglycerin.  Pre-diabetes (A1c goal <6.5%) -Controlled -Current medications: No medications -Medications previously tried: none  -Current home glucose readings: 190, 147, 173, 233, 153, 217, 125, 112172, 129, 114, 139 fasting glucose: n/a post prandial glucose: n/a -Denies hypoglycemic/hyperglycemic symptoms -Current meal patterns:  breakfast: n/a  lunch: n/a  dinner: n/a snacks: n/a drinks: n/a -Current exercise: none -Educated on A1c and blood sugar goals; -Counseled to check feet daily and get yearly eye exams -Counseled on diet and exercise extensively   Atrial Fibrillation (Goal: prevent stroke and major bleeding) -Controlled -CHADSVASC: 6 -Current treatment: Rate control: Diltiazem 120 mg 1 capsule daily Anticoagulation: Eliquis 5 mg 1 tablet twice daily -Medications previously tried: none -Home BP and HR readings: refer to above  -Counseled on increased risk of stroke due to Afib and benefits of anticoagulation for stroke prevention; bleeding risk associated with Eliquis and importance of self-monitoring for signs/symptoms of bleeding; avoidance of NSAIDs due to increased bleeding risk with anticoagulants; -Recommended to continue current medication  GERD (Goal: minimize symptoms) -Controlled -Current treatment  Omeprazole 40 mg 1 capsule daily - in AM  -Medications previously tried: none  -Counseled on non-pharmacologic management of symptoms such as elevating the head of your bed, avoiding eating 2-3 hours before bed, avoiding triggering foods such as acidic, spicy, or fatty foods, eating smaller meals, and wearing clothes that are loose  around the waist Recommended taking before food.  Pain (Goal: minimize pain) -Uncontrolled -Current treatment  Oxycodone 30 mg 1 tablet as needed for  pain Gabapentin 100 mg 1 capsule three times daily - twice daily -Medications previously tried: n/a  -Counseled on risk for sedation with taking both of these medications and he makes sure to separate them.  Hypothyroidism (Goal: 0.35-4.5) -Uncontrolled -Current treatment  Levothyroxine 200 mcg 1 tablet daily -Medications previously tried: none  - Consider dose decrease based on lower TSH.  Restless legs syndrome (Goal: minimize symptoms) -Uncontrolled -Current treatment  Pramipexole 0.5 mg 1 tablet three times daily -Medications previously tried: ropinirole  -Counseled on limiting caffeine intake.  Onchomycosis (Goal: cure fungus) -Controlled -Current treatment  Terbinafine 250 mg 1 tablet daily -Medications previously tried: n/a  -Recommended to continue current medication   Health Maintenance -Vaccine gaps: shingles, COVID, influenza, PCV20 -Current therapy:  Fluticasone 50 mcg as needed Vitamin C daily as needed Diclofenac gel 1% as needed Vitamin B12  (2 months)  Magnesium  -Educated on Cost vs benefit of each product must be carefully weighed by individual consumer -Patient is satisfied with current therapy and denies issues -Recommended to continue current medication  Patient Goals/Self-Care Activities Patient will:  - focus on medication adherence by setting alarms check glucose weekly, document, and provide at future appointments check blood pressure at least weekly, document, and provide at future appointments target a minimum of 150 minutes of moderate intensity exercise weekly  Follow Up Plan: Telephone follow up appointment with care management team member scheduled for:      Mr. Terhune was given information about Chronic Care Management services today including:  CCM service includes personalized support from designated clinical staff supervised by his physician, including individualized plan of care and coordination with other care providers 24/7  contact phone numbers for assistance for urgent and routine care needs. Standard insurance, coinsurance, copays and deductibles apply for chronic care management only during months in which we provide at least 20 minutes of these services. Most insurances cover these services at 100%, however patients may be responsible for any copay, coinsurance and/or deductible if applicable. This service may help you avoid the need for more expensive face-to-face services. Only one practitioner may furnish and bill the service in a calendar month. The patient may stop CCM services at any time (effective at the end of the month) by phone call to the office staff.  Patient agreed to services and verbal consent obtained.   Patient verbalizes understanding of instructions provided today and agrees to view in [place].  Telephone follow up appointment with pharmacy team member scheduled for:3 months  Viona Gilmore, [address] [facility]
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
## 2021-05-18 ENCOUNTER — Other Ambulatory Visit: Payer: Self-pay | Admitting: General Surgery

## 2021-05-18 ENCOUNTER — Other Ambulatory Visit: Payer: Medicare Other

## 2021-05-18 DIAGNOSIS — N5089 Other specified disorders of the male genital organs: Secondary | ICD-10-CM

## 2021-05-19 ENCOUNTER — Telehealth: Payer: Self-pay

## 2021-05-19 ENCOUNTER — Ambulatory Visit
Admission: RE | Admit: 2021-05-19 | Discharge: 2021-05-19 | Disposition: A | Discharge status: Home / Self Care | Payer: Medicare Other | Source: Ambulatory Visit | Attending: General Surgery | Admitting: General Surgery

## 2021-05-19 ENCOUNTER — Other Ambulatory Visit: Payer: Self-pay

## 2021-05-19 DIAGNOSIS — N5089 Other specified disorders of the male genital organs: Secondary | ICD-10-CM

## 2021-05-19 NOTE — Telephone Encounter (Signed)
Patient called stating that he misplaced Rx oxycodone (ROXICODONE) 30 MG immediate release tablet And would like a refill sent to the pharmacy today

## 2021-05-19 NOTE — Telephone Encounter (Signed)
Pt last refill was done on 04/21/2021, please advise

## 2021-05-22 ENCOUNTER — Telehealth: Payer: Self-pay | Admitting: Family Medicine

## 2021-05-22 DIAGNOSIS — E785 Hyperlipidemia, unspecified: Secondary | ICD-10-CM | POA: Diagnosis not present

## 2021-05-22 DIAGNOSIS — I2510 Atherosclerotic heart disease of native coronary artery without angina pectoris: Secondary | ICD-10-CM | POA: Diagnosis not present

## 2021-05-22 DIAGNOSIS — I10 Essential (primary) hypertension: Secondary | ICD-10-CM | POA: Diagnosis not present

## 2021-05-22 NOTE — Telephone Encounter (Signed)
I do not see this medicine on his active medicine list Please update his medicine list and review for last refill date and how many pills given.  If appropriate I could fill 1 to 2 weeks worth.  But need more information

## 2021-05-22 NOTE — Telephone Encounter (Signed)
Patient called to schedule visit with Dr.Fry for refills on oxycodone (ROXICODONE) 30 MG immediate release tablet. I let patient know that he was out of office this week and I would send a message back. Patient states he has missplaced the last couple pills he had during cleaning and he is now out.     Good callback number is [phone]  Please send to   [id number] - [place], [place] - [address] Phone:  [phone]  Fax:  [phone]       Please advise

## 2021-05-22 NOTE — Telephone Encounter (Signed)
Pt PCP is Dr Sarajane Jews and is out of the office for the rest of the week Pt LOV was 02/15/2021 Last refill done 11/22/2020 Please advise

## 2021-05-23 MED ORDER — OXYCODONE HCL 30 MG PO TABS: 30.0000 mg | ORAL_TABLET | Freq: Four times a day (QID) | ORAL | 0 refills | Status: DC | PRN

## 2021-05-23 NOTE — Telephone Encounter (Signed)
Patient called again to follow up on prescription. I let him know that it was being worked on. Patient stated he was in pain and wanted to know if there was any way he could get some today. I let him know I would send a message back     Please advise

## 2021-05-23 NOTE — Telephone Encounter (Signed)
40 tablets sent to his pharmacy and Dr. Barbie Banner absence. Future refill through Dr. Sarajane Jews.

## 2021-05-23 NOTE — Telephone Encounter (Signed)
Pt Rx for Oxycodone 30 mg is not on pt medication list but on Dr Sarajane Jews notes from his last pain management visit on 02/15/2021 Pt last refill was on 04/21/2021 for 120 tablets, Please advise

## 2021-05-26 NOTE — Telephone Encounter (Signed)
He needs a PMV 

## 2021-05-26 NOTE — Telephone Encounter (Signed)
Called patient to inform PMV is needed before any future refills.   Patient will call office at later time to schedule appointment.

## 2021-05-30 DIAGNOSIS — R21 Rash and other nonspecific skin eruption: Secondary | ICD-10-CM | POA: Diagnosis not present

## 2021-05-30 NOTE — Telephone Encounter (Signed)
Pt was notified.  

## 2021-06-05 ENCOUNTER — Ambulatory Visit (INDEPENDENT_AMBULATORY_CARE_PROVIDER_SITE_OTHER): Payer: Medicare Other | Admitting: Family Medicine

## 2021-06-05 ENCOUNTER — Encounter: Payer: Self-pay | Admitting: Family Medicine

## 2021-06-05 VITALS — BP 138/78 | HR 73 | Temp 98.0°F | Wt 211.0 lb

## 2021-06-05 DIAGNOSIS — Z23 Encounter for immunization: Secondary | ICD-10-CM | POA: Diagnosis not present

## 2021-06-05 DIAGNOSIS — M5440 Lumbago with sciatica, unspecified side: Secondary | ICD-10-CM

## 2021-06-05 DIAGNOSIS — F1190 Opioid use, unspecified, uncomplicated: Secondary | ICD-10-CM

## 2021-06-05 MED ORDER — OXYCODONE HCL 30 MG PO TABS: 30.0000 mg | ORAL_TABLET | Freq: Four times a day (QID) | ORAL | 0 refills | Status: DC | PRN

## 2021-06-05 MED ORDER — FLUCONAZOLE 150 MG PO TABS: 150.0000 mg | ORAL_TABLET | Freq: Every day | ORAL | 1 refills | Status: DC

## 2021-06-05 NOTE — Progress Notes (Signed)
   Subjective:    Patient ID: [name], male    DOB: 05/16/1941, 80 y.o.   MRN: [id number]  HPI Here for pain management, he is doing well.    Review of Systems     Objective:   Physical Exam        Assessment & Plan:  Pain management. Indication for chronic opioid: low back pain Medication and dose: Oxycodone 30 mg  # pills per month: 120 Last UDS date: 08-23-20 Opioid Treatment Agreement signed (Y/N): 08-26-17 Opioid Treatment Agreement last reviewed with patient:  06-05-21 [facility] reviewed this encounter (include red flags): Yes Meds were refilled. [name], MD

## 2021-06-07 ENCOUNTER — Other Ambulatory Visit: Payer: Self-pay

## 2021-06-09 ENCOUNTER — Ambulatory Visit (INDEPENDENT_AMBULATORY_CARE_PROVIDER_SITE_OTHER): Payer: Medicare Other

## 2021-06-09 DIAGNOSIS — I10 Essential (primary) hypertension: Secondary | ICD-10-CM

## 2021-06-09 DIAGNOSIS — I4892 Unspecified atrial flutter: Secondary | ICD-10-CM

## 2021-06-09 DIAGNOSIS — M5440 Lumbago with sciatica, unspecified side: Secondary | ICD-10-CM

## 2021-06-09 DIAGNOSIS — E785 Hyperlipidemia, unspecified: Secondary | ICD-10-CM

## 2021-06-09 DIAGNOSIS — I2510 Atherosclerotic heart disease of native coronary artery without angina pectoris: Secondary | ICD-10-CM

## 2021-06-09 NOTE — Patient Instructions (Signed)
Visit Information  Thank you for taking time to visit with me today. Please don't hesitate to contact me if I can be of assistance to you before our next scheduled telephone appointment.  Telephone follow up appointment with care management team member scheduled for: 08/18/21  If you need to cancel or re-schedule our visit, please call [phone] and our care guide team will be happy to assist you.  Following is a list of the goals we discussed today:  Patient will self administer medications as prescribed Patient will attend all scheduled provider appointments Patient will call pharmacy for medication refills Patient will continue to perform ADL's independently Patient will continue to perform IADL's independently Patient will call provider office for new concerns or questions call office if I gain more than 2 pounds in one day or 5 pounds in one week keep legs up while sitting use salt in moderation watch for swelling in feet, ankles and legs every day weigh myself daily begin a heart failure diary follow rescue plan if symptoms flare-up eat more whole grains, fruits and vegetables, lean meats and healthy fats - make a plan to exercise regularly - make a plan to eat healthy - take medicine as prescribed - check blood pressure 3 times per week - choose a place to take my blood pressure (home, clinic or office, retail store) - write blood pressure results in a log or diary - learn about high blood pressure - take blood pressure log to all doctor appointments - call doctor for signs and symptoms of high blood pressure - limit salt intake to 2300mg /day - call for medicine refill 2 or 3 days before it runs out - take all medications exactly as prescribed - call doctor with any symptoms you believe are related to your medicine - learn relaxation techniques - practice acceptance of chronic pain - practice relaxation or meditation daily - tell myself I can (not I can't) - think of  new ways to do favorite things - use distraction techniques - use relaxation during pain  Patient verbalizes understanding of instructions provided today and agrees to view in [place].  Peter Garter RN, Jackquline Denmark, CDE Care Management Coordinator [facility] [phone], Mobile [phone]
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
## 2021-06-12 DIAGNOSIS — L500 Allergic urticaria: Secondary | ICD-10-CM | POA: Diagnosis not present

## 2021-06-13 ENCOUNTER — Telehealth: Payer: Self-pay

## 2021-06-13 NOTE — Telephone Encounter (Signed)
--  Caller states that he has a rash all over his back,chest, and arms. States red bumps that itch. States on steroids for a yeast infection.  06/12/2021 9:35:35 AM See HCP within 4 Hours (or PCP triage) Waymond Cera, RN, [place] Understands Yes  Comments User: Sharol Given, RN Date/Time Eilene Ghazi Time): 06/12/2021 9:40:44 AM Called backline to get appointment. [facility] states no appointments available and suggested being seen in UC. Advised caller to be seen in UC in the next 4 hours. Verbalizes understanding.  Referrals GO TO FACILITY UNDECIDED  06/13/21 1034: Pt states he went to UC yesterday & "got some pills". Pt advised to take all meds as prescribed & call if no improvement or condition worsens. Pt verb understanding.

## 2021-06-21 DIAGNOSIS — I2510 Atherosclerotic heart disease of native coronary artery without angina pectoris: Secondary | ICD-10-CM

## 2021-06-21 DIAGNOSIS — E785 Hyperlipidemia, unspecified: Secondary | ICD-10-CM

## 2021-06-21 DIAGNOSIS — I10 Essential (primary) hypertension: Secondary | ICD-10-CM

## 2021-06-23 NOTE — Progress Notes (Signed)
Surgical Instructions    Your procedure is scheduled on Thursday, December 8th, 2022.   Report to [facility] Main Entrance "A" at 07:00 A.M., then check in with the Admitting office.  Call this number if you have problems the morning of surgery:  [phone]   If you have any questions prior to your surgery date call [phone]: Open Monday-Friday 8am-4pm    Remember:  Do not eat after midnight the night before your surgery  You may drink clear liquids until 06:00 the morning of your surgery.   Clear liquids allowed are: Water, Non-Citrus Juices (without pulp), Carbonated Beverages, Clear Tea, Black Coffee ONLY (NO MILK, CREAM OR POWDERED CREAMER of any kind), and Gatorade  Patient Instructions  The night before surgery:  No food after midnight. ONLY clear liquids after midnight  The day of surgery (if you do NOT have diabetes):   Drink ONE (1) Pre-Surgery Clear Ensure by 06:00 the morning of surgery. Drink in one sitting. Do not sip.  This drink was given to you during your hospital  pre-op appointment visit.  Nothing else to drink after completing the  Pre-Surgery Clear Ensure.     Take these medicines the morning of surgery with A SIP OF WATER:  diltiazem (CARDIZEM CD) fluticasone (FLONASE) gabapentin (NEURONTIN) levothyroxine (SYNTHROID)  omeprazole (PRILOSEC)  ropinirole (REQUIP)  If needed:   nitroGLYCERIN (NITROSTAT)  oxycodone (ROXICODONE)  Follow your surgeon's instructions on when to stop Eliquis.  If no instructions were given by your surgeon then you will need to call the office to get those instructions.     As of today, STOP taking any Aspirin (unless otherwise instructed by your surgeon) Aleve, Naproxen, Ibuprofen, Motrin, Advil, Goody's, BC's, all herbal medications, fish oil, and all vitamins.     After your COVID test   You are not required to quarantine however you are required to wear a well-fitting mask when you are out and around people  not in your household.  If your mask becomes wet or soiled, replace with a new one.  Wash your hands often with soap and water for 20 seconds or clean your hands with an alcohol-based hand sanitizer that contains at least 60% alcohol.  Do not share personal items.  Notify your provider: if you are in close contact with someone who has COVID  or if you develop a fever of 100.4 or greater, sneezing, cough, sore throat, shortness of breath or body aches.    The day of surgery:  Do not wear jewelry  Do not wear lotions, powders, colognes, or deodorant. Men may shave face and neck. Do not bring valuables to the hospital.              [facility] is not responsible for any belongings or valuables.  Do NOT Smoke (Tobacco/Vaping)  24 hours prior to your procedure  If you use a CPAP at night, you may bring your mask for your overnight stay.   Contacts, glasses, hearing aids, dentures or partials may not be worn into surgery, please bring cases for these belongings   For patients admitted to the hospital, discharge time will be determined by your treatment team.   Patients discharged the day of surgery will not be allowed to drive home, and someone needs to stay with them for 24 hours.  NO VISITORS WILL BE ALLOWED IN PRE-OP WHERE PATIENTS ARE PREPPED FOR SURGERY.  ONLY 1 SUPPORT PERSON MAY BE PRESENT IN THE WAITING ROOM WHILE YOU ARE IN  SURGERY.  IF YOU ARE TO BE ADMITTED, ONCE YOU ARE IN YOUR ROOM YOU WILL BE ALLOWED TWO (2) VISITORS. 1 (ONE) VISITOR MAY STAY OVERNIGHT BUT MUST ARRIVE TO THE ROOM BY 8pm.  Minor children may have two parents present. Special consideration for safety and communication needs will be reviewed on a case by case basis.  Special instructions:    Oral Hygiene is also important to reduce your risk of infection.  Remember - BRUSH YOUR TEETH THE MORNING OF SURGERY WITH YOUR REGULAR TOOTHPASTE   [place]- Preparing For Surgery  Before surgery, you can play an  important role. Because skin is not sterile, your skin needs to be as free of germs as possible. You can reduce the number of germs on your skin by washing with CHG (chlorahexidine gluconate) Soap before surgery.  CHG is an antiseptic cleaner which kills germs and bonds with the skin to continue killing germs even after washing.     Please do not use if you have an allergy to CHG or antibacterial soaps. If your skin becomes reddened/irritated stop using the CHG.  Do not shave (including legs and underarms) for at least 48 hours prior to first CHG shower. It is OK to shave your face.  Please follow these instructions carefully.     Shower the NIGHT BEFORE SURGERY and the MORNING OF SURGERY with CHG Soap.   If you chose to wash your hair, wash your hair first as usual with your normal shampoo. After you shampoo, rinse your hair and body thoroughly to remove the shampoo.  Then [employer] and genitals (private parts) with your normal soap and rinse thoroughly to remove soap.  After that Use CHG Soap as you would any other liquid soap. You can apply CHG directly to the skin and wash gently with a scrungie or a clean washcloth.   Apply the CHG Soap to your body ONLY FROM THE NECK DOWN.  Do not use on open wounds or open sores. Avoid contact with your eyes, ears, mouth and genitals (private parts). Wash Face and genitals (private parts)  with your normal soap.   Wash thoroughly, paying special attention to the area where your surgery will be performed.  Thoroughly rinse your body with warm water from the neck down.  DO NOT shower/wash with your normal soap after using and rinsing off the CHG Soap.  Pat yourself dry with a CLEAN TOWEL.  Wear CLEAN PAJAMAS to bed the night before surgery  Place CLEAN SHEETS on your bed the night before your surgery  DO NOT SLEEP WITH PETS.   Day of Surgery:  Take a shower with CHG soap. Wear Clean/Comfortable clothing the morning of surgery Do not apply any  deodorants/lotions.   Remember to brush your teeth WITH YOUR REGULAR TOOTHPASTE.   Please read over the following fact sheets that you were given.

## 2021-06-24 ENCOUNTER — Other Ambulatory Visit: Payer: Self-pay | Admitting: Family Medicine

## 2021-06-26 ENCOUNTER — Encounter (HOSPITAL_COMMUNITY): Payer: Self-pay

## 2021-06-26 ENCOUNTER — Encounter (HOSPITAL_COMMUNITY)
Admission: RE | Admit: 2021-06-26 | Discharge: 2021-06-26 | Disposition: A | Discharge status: Home / Self Care | Payer: Medicare Other | Source: Ambulatory Visit | Attending: General Surgery | Admitting: General Surgery

## 2021-06-26 ENCOUNTER — Other Ambulatory Visit: Payer: Self-pay

## 2021-06-26 DIAGNOSIS — E785 Hyperlipidemia, unspecified: Secondary | ICD-10-CM | POA: Diagnosis not present

## 2021-06-26 DIAGNOSIS — K219 Gastro-esophageal reflux disease without esophagitis: Secondary | ICD-10-CM | POA: Insufficient documentation

## 2021-06-26 DIAGNOSIS — I110 Hypertensive heart disease with heart failure: Secondary | ICD-10-CM | POA: Insufficient documentation

## 2021-06-26 DIAGNOSIS — E669 Obesity, unspecified: Secondary | ICD-10-CM | POA: Diagnosis not present

## 2021-06-26 DIAGNOSIS — D6489 Other specified anemias: Secondary | ICD-10-CM | POA: Diagnosis not present

## 2021-06-26 DIAGNOSIS — G4733 Obstructive sleep apnea (adult) (pediatric): Secondary | ICD-10-CM | POA: Diagnosis not present

## 2021-06-26 DIAGNOSIS — R7303 Prediabetes: Secondary | ICD-10-CM | POA: Insufficient documentation

## 2021-06-26 DIAGNOSIS — Z955 Presence of coronary angioplasty implant and graft: Secondary | ICD-10-CM | POA: Diagnosis not present

## 2021-06-26 DIAGNOSIS — Z01812 Encounter for preprocedural laboratory examination: Secondary | ICD-10-CM | POA: Diagnosis not present

## 2021-06-26 DIAGNOSIS — Z6832 Body mass index (BMI) 32.0-32.9, adult: Secondary | ICD-10-CM | POA: Insufficient documentation

## 2021-06-26 DIAGNOSIS — I2510 Atherosclerotic heart disease of native coronary artery without angina pectoris: Secondary | ICD-10-CM | POA: Diagnosis not present

## 2021-06-26 DIAGNOSIS — I252 Old myocardial infarction: Secondary | ICD-10-CM | POA: Insufficient documentation

## 2021-06-26 DIAGNOSIS — D649 Anemia, unspecified: Secondary | ICD-10-CM

## 2021-06-26 DIAGNOSIS — Z7901 Long term (current) use of anticoagulants: Secondary | ICD-10-CM | POA: Insufficient documentation

## 2021-06-26 DIAGNOSIS — I480 Paroxysmal atrial fibrillation: Secondary | ICD-10-CM | POA: Diagnosis not present

## 2021-06-26 DIAGNOSIS — Z87891 Personal history of nicotine dependence: Secondary | ICD-10-CM | POA: Diagnosis not present

## 2021-06-26 DIAGNOSIS — E039 Hypothyroidism, unspecified: Secondary | ICD-10-CM | POA: Diagnosis not present

## 2021-06-26 DIAGNOSIS — I509 Heart failure, unspecified: Secondary | ICD-10-CM | POA: Diagnosis not present

## 2021-06-26 HISTORY — DX: Prediabetes: R73.03

## 2021-06-26 HISTORY — DX: Presence of cardiac pacemaker: Z95.0

## 2021-06-26 LAB — BASIC METABOLIC PANEL
Anion gap: 7 (ref 5–15)
BUN: 13 mg/dL (ref 8–23)
CO2: 27 mmol/L (ref 22–32)
Calcium: 9 mg/dL (ref 8.9–10.3)
Chloride: 101 mmol/L (ref 98–111)
Creatinine, Ser: 1.11 mg/dL (ref 0.61–1.24)
GFR, Estimated: >60 mL/min (ref >60)
Glucose, Bld: 161 mg/dL — ABNORMAL HIGH (ref 70–99)
Potassium: 4 mmol/L (ref 3.5–5.1)
Sodium: 135 mmol/L (ref 135–145)

## 2021-06-26 LAB — CBC
HCT: 46.4 % (ref 39.0–52.0)
Hemoglobin: 14.4 g/dL (ref 13.0–17.0)
MCH: 27.3 pg (ref 26.0–34.0)
MCHC: 31 g/dL (ref 30.0–36.0)
MCV: 88 fL (ref 80.0–100.0)
Platelets: 245 10*3/uL (ref 150–400)
RBC: 5.27 MIL/uL (ref 4.22–5.81)
RDW: 17.5 % — ABNORMAL HIGH (ref 11.5–15.5)
WBC: 17.5 10*3/uL — ABNORMAL HIGH (ref 4.0–10.5)
nRBC: 0 % (ref 0.0–0.2)

## 2021-06-26 LAB — GLUCOSE, CAPILLARY: Glucose-Capillary: 184 mg/dL — ABNORMAL HIGH (ref 70–99)

## 2021-06-26 LAB — HEMOGLOBIN A1C
Hgb A1c MFr Bld: 6.7 % — ABNORMAL HIGH (ref 4.8–5.6)
Mean Plasma Glucose: 145.59 mg/dL

## 2021-06-26 NOTE — Progress Notes (Signed)
PCP - Dr. [name] Cardiologist - Dr. [name]  PPM/ICD - [employer] pacemaker Device Orders - no device orders given. Per RN at [facility], pt has not been seen in clinic for several years.  Rep Notified - Leanna Sato notified of orders and pt's surgery  Chest x-ray - 12/25/20 EKG - 12/25/20 Stress Test - 01/20/19 ECHO - 01/20/19 Cardiac Cath - 11/26/14  Sleep Study - 10/27/2008, pt diagnosed with OSA  CPAP - no  DM- Pre-diabetic Fasting Blood Sugar - pt unsure Checks Blood Sugar 2-3 times a week  Blood Thinner Instructions: Hold Eliquis for 2 days (Last dose 12/6) Aspirin Instructions: n/a  ERAS Protcol - yes PRE-SURGERY Ensure given at PAT  COVID TEST- n/a, ambulatory surgery   Anesthesia review: yes, cardiax hx and pacemaker  Patient denies shortness of breath, fever, cough and chest pain at PAT appointment   All instructions explained to the patient, with a verbal understanding of the material. Patient agrees to go over the instructions while at home for a better understanding. Patient also instructed to wear a mask in public for 3 days prior to surgery. The opportunity to ask questions was provided.

## 2021-06-26 NOTE — Progress Notes (Signed)
Device orders requested. Per Trish Fountain, RN,  [employer] is aware about this pt's surgery. They are unable to put in pre-op device orders because the pt hasn't been seen in clinic in several years. Portia said [employer] rep has already been contacted

## 2021-06-27 NOTE — Progress Notes (Addendum)
Anesthesia Chart Review:  Case: [id number] Date/Time: 06/29/21 0845   Procedures:      LAPAROSCOPIC UMBILICAL HERNIA REPAIR WITH MESH     LAPAROSCOPIC LEFT INGUINAL HERNIA REPAIR WITH MESH (Left)   Anesthesia type: General   Pre-op diagnosis: UMBILICAL HERNIA AND LEFT INGUINAL HERNIA   Location: [place] OR ROOM 02 / [place] OR   Surgeons: Ralene Ok, MD       DISCUSSION: Patient is a 80 year old male scheduled for the above procedure.  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 BMI is consistent with obesity.   Preoperative cardiology input outlined on 05/11/21 by Coletta Memos, NP, "Given past medical history and time since last visit, based on ACC/AHA guidelines, DUWANE GEWIRTZ would be at acceptable risk for the planned procedure without further cardiovascular testing.    Patient with diagnosis of A Fib on Eliquis for anticoagulation.[name] KitchenMarland KitchenPer office protocol, patient can hold Eliquis for 2 days prior to procedure." Reported last Eliquis 06/27/21.   He has future follow-up with Dr. [name] on 06/28/21. Unsure if his device will be interrogated at while he is at [facility], otherwise his EP visit is not scheduled until 07/20/21. (Device Clinic would not complete preoperative cardiac device prescription form because he is overdue for in-person EP visit.) He has been sending remote PPM transmissions. As of 03/30/21: Normal device function. One short NSVT arrhythmia detected. There were 153 atrial arrhythmias detected, AF burden is 0.8% of the time, the longest was 52.25 minutes. Past reports state that the  patient is on oral anticoagulation. Next remote 91 days.   I contacted patient because 06/26/21 labs revealed elevated WBC of 17.5K. He denied fever, dysuria, cough, N/V/D; however, he said that he recently came off of a prednisone taper and Diflucan for bilateral groin "yeast" rash. (Said last prednisone dose 06/22/21 or 06/23/21.) He says the rash is now cleared. I will update Dr. Rosendo Gros. Will plan to update note if any additional input from Dr. Rosendo Gros or by Dr. [name] following 06/28/21 routine visit. (UPDATE 06/28/21 1:05 PM: Dr. Rosendo Gros thinks leukocytosis is likely from recent steroids. He will evaluate on the day of surgery, but for now no plans to repeat CBC prior to surgery. Reviewed 06/28/21 office note by Dr. [name].  Anesthesia team to evaluate on the day of surgery.)     VS: BP (P) 139/81   Pulse (!) (P) 58   Temp (P) 36.5 C (Oral)   Resp (P) 18   Ht (P) 5\' 9"  (1.753 m)   Wt (P) 100.8 kg   SpO2 (P) 99%   BMI (P) 32.83 kg/m    PROVIDERS: [name], MD his PCP - [name], MD is cardiologist.  Currently last visit 12/27/2020.  No chest pain and stable volume status at that time. Patient has been off aspirin since restarting Eliquis.  He stopped Zetia and is intolerant to statins.  He refused to take beta-blockers.  He was a change from furosemide to torsemide due to possible medication reaction.  37-month follow-up planned. Virl Axe, MD is EP cardiologist. Last visit 04/03/17. He has an appointment scheduled for 07/20/21 with Tommye Standard, Malachy Chamber, MD is urologist  LABS: Preoperative labs noted. WBC elevated at 17.5K. See DISCUSSION. (all labs ordered are listed, but only abnormal results are displayed)  Labs Reviewed  GLUCOSE, CAPILLARY - Abnormal; Notable for the following components:      Result Value   Glucose-Capillary 184 (*)    All other components within normal limits  HEMOGLOBIN A1C - Abnormal; Notable for the following  components:   Hgb A1c MFr Bld 6.7 (*)    All other components within normal limits  BASIC METABOLIC PANEL - Abnormal; Notable for the following components:   Glucose, Bld 161 (*)    All other components within normal limits  CBC - Abnormal; Notable for the following components:   WBC 17.5 (*)    RDW 17.5 (*)    All other components within normal limits    IMAGES: US Scrotum 05/19/21: IMPRESSION: - Multiple subserosal cysts within the right testicle compatible with benign tunic albuginea cysts. - Small bilateral varicoceles - Punctate calcifications within the right epididymis possibly the sequela of remote trauma or inflammation. Nonvisualization of the left epididymis.   CXR 12/25/20: FINDINGS: Normal mediastinum and cardiac silhouette. Normal pulmonary vasculature. No evidence of effusion, infiltrate, or pneumothorax. No acute bony abnormality. Degenerative osteophytosis of the spine. IMPRESSION: No active cardiopulmonary disease.   EKG: 12/25/20:  Sinus or ectopic atrial rhythm Paired ventricular premature complexes Nonspecific IVCD with LAD Anterolateral infarct, old Confirmed by Ronnald Nian, Adam ([id number]) on 12/25/2020 3:10:44 PM - It looks like ventricular pacing spikes are present  CV: Echo 01/20/19: - Sonographer Comments: Technically difficult study due to poor echo  windows. Uncooperative.  IMPRESSIONS   1. The left ventricle has normal systolic function with an ejection  fraction of 60-65%. The cavity size was normal. There is mildly increased  left ventricular wall thickness. Left ventricular diastolic Doppler  parameters are consistent with impaired  relaxation.   2. The right ventricle has normal systolic function. The cavity was  normal. There is no increase in right ventricular wall thickness.   3. There is mild mitral annular calcification present.   4. There is dilatation of the ascending aorta measuring 38 mm.    Nuclear stress test 01/20/19:  Nuclear  stress EF: 56%. There was no ST segment deviation noted during stress. The study is normal. This is a low risk study. The left ventricular ejection fraction is normal (55-65%). Normal resting and stress perfusion. No ischemia or infarction EF 56%     Cardiac event monitor 12/12/16-12/25/16: Sinus with 3.2 second pauses.  Atrial flutter.  - This monitor was worn before his pacemaker was implanted. No changes. He is on Xarelto. Pacemaker now in place. Followed by EP.     US Carotid 12/27/15: Summary:  Findings consistent with 1- 39 percent stenosis involving the right  internal carotid artery and the left internal carotid artery.    Cardiac cath 11/26/14:  Prox RCA lesion, 20% stenosed. Mid RCA lesion, 40% stenosed. Mid RCA to Dist RCA lesion, 30% stenosed. Mid Cx lesion, 30% stenosed. Ost LAD to Prox LAD lesion, 50% stenosed. Dist LAD lesion, 25% stenosed.   1. Double vessel CAD with patent stents LAD and RCA 2. Moderate proximal LAD stenosis that was evaluated with a FFR 10 days ago and was not flow limiting, unchanged in appearance from cath in 2014 3. Normal LV systolic function 4. Possible dyspnea and chest pressure from Brilinta   Past Medical History:  Diagnosis Date   Adenomatous polyp of colon 2007   Arthritis  Atrial flutter ([place])    AVM (arteriovenous malformation) 2011   a. S/p argon plasma coagulation and ablation in 2011, 2017.   Bradycardia    a. H/o almost 7sec pause nocturnally during 2011 admission. Also has h/o fatigue with BB.   CAD (coronary artery disease)    a. Nonobst disease by cath 2009. b. s/p PCI 10/2014 with DES to [place], patent by relook 11/2014 (Brilinta changed to Plavix with improved sx).   Chronic diastolic CHF (congestive heart failure) (HCC)    Depression    Diverticulosis    Gastritis 2011   GERD (gastroesophageal reflux disease)    History of hiatal hernia    Hyperlipidemia    Hypertension    Hypothyroidism    Myocardial  infarction ([place])    Obstructive sleep apnea    -no cpap use   Pre-diabetes    Premature atrial contractions Holter 2016   Presence of permanent cardiac pacemaker    PVC's (premature ventricular contractions) Holter 2016   Statin intolerance    Transfusion history    several years ago -GI bleed    Past Surgical History:  Procedure Laterality Date   ANKLE SURGERY Left    APPENDECTOMY     CARDIAC CATHETERIZATION N/A 11/26/2014   Procedure: Left Heart Cath and Coronary Angiography;  Surgeon: Burnell Blanks, MD;  Location: [place] CV LAB;  Service: Cardiovascular;  Laterality: N/A;   CHOLECYSTECTOMY N/A 08/23/2016   Procedure: LAPAROSCOPIC CHOLECYSTECTOMY;  Surgeon: Coralie Keens, MD;  Location: [place];  Service: General;  Laterality: N/A;   COLONOSCOPY  08-23-05   per Dr. Deatra Ina, adenomatous polyps, repeat in 5 yrs    COLONOSCOPY WITH PROPOFOL N/A 12/06/2015   Procedure: COLONOSCOPY WITH PROPOFOL;  Surgeon: Doran Stabler, MD;  Location: [facility] ENDOSCOPY;  Service: Gastroenterology;  Laterality: N/A;   ENTEROSCOPY N/A 12/06/2015   Procedure: ENTEROSCOPY;  Surgeon: Doran Stabler, MD;  Location: [facility] ENDOSCOPY;  Service: Gastroenterology;  Laterality: N/A;   ESOPHAGOGASTRODUODENOSCOPY  08-23-05   per Dr. Deatra Ina, cauterized jejunal AVMs    [place] N/A 12/06/2015   Procedure: HOT HEMOSTASIS (ARGON PLASMA COAGULATION/BICAP);  Surgeon: Doran Stabler, MD;  Location: [facility];  Service: Gastroenterology;  Laterality: N/A;   LEFT HEART CATHETERIZATION WITH CORONARY ANGIOGRAM N/A 09/08/2012   Procedure: LEFT HEART CATHETERIZATION WITH CORONARY ANGIOGRAM;  Surgeon: Burnell Blanks, MD;  Location: [facility] CATH LAB;  Service: Cardiovascular;  Laterality: N/A;   LEFT HEART CATHETERIZATION WITH CORONARY ANGIOGRAM N/A 11/16/2014   Procedure: LEFT HEART CATHETERIZATION WITH CORONARY ANGIOGRAM;  Surgeon: Leonie Man, MD;  Location: [facility] C[facility] LAB;  Service:  Cardiovascular;  Laterality: N/A;   PACEMAKER IMPLANT N/A 12/24/2016   Procedure: Pacemaker Implant;  Surgeon: Deboraha Sprang, MD;  Location: [place] CV LAB;  Service: Cardiovascular;  Laterality: N/A;   SKIN GRAFT Right    leg   TONSILLECTOMY      MEDICATIONS:  apixaban (ELIQUIS) 5 MG TABS tablet   Ascorbic Acid (VITAMIN C PO)   clotrimazole (LOTRIMIN) 1 % cream   diclofenac sodium (VOLTAREN) 1 % GEL   diltiazem (CARDIZEM CD) 120 MG 24 hr capsule   fluconazole (DIFLUCAN) 150 MG tablet   fluticasone (FLONASE) 50 MCG/ACT nasal spray   gabapentin (NEURONTIN) 100 MG capsule   levothyroxine (SYNTHROID) 200 MCG tablet   magnesium oxide (MAG-OX) 400 MG tablet   nitroGLYCERIN (NITROSTAT) 0.4 MG SL tablet   omeprazole (PRILOSEC)  40 MG capsule   [START ON 08/05/2021] oxycodone (ROXICODONE) 30 MG immediate release tablet   pramipexole (MIRAPEX) 0.5 MG tablet   ropinirole (REQUIP) 5 MG tablet   terbinafine (LAMISIL) 250 MG tablet   torsemide (DEMADEX) 20 MG tablet   vitamin B-12 (CYANOCOBALAMIN) 100 MCG tablet   No current facility-administered medications for this encounter.    Myra Gianotti, PA-C Surgical Short Stay/Anesthesiology [facility] Phone [phone] [facility] Phone [phone] 06/27/2021 8:01 PM

## 2021-06-28 ENCOUNTER — Ambulatory Visit (INDEPENDENT_AMBULATORY_CARE_PROVIDER_SITE_OTHER): Payer: Medicare Other

## 2021-06-28 ENCOUNTER — Encounter: Payer: Self-pay | Admitting: Family Medicine

## 2021-06-28 ENCOUNTER — Encounter: Payer: Self-pay | Admitting: Cardiovascular Disease

## 2021-06-28 ENCOUNTER — Other Ambulatory Visit: Payer: Self-pay

## 2021-06-28 ENCOUNTER — Ambulatory Visit: Payer: Medicare Other | Admitting: Cardiovascular Disease

## 2021-06-28 VITALS — BP 156/86 | HR 56 | Ht 70.0 in | Wt 231.2 lb

## 2021-06-28 DIAGNOSIS — E785 Hyperlipidemia, unspecified: Secondary | ICD-10-CM | POA: Diagnosis not present

## 2021-06-28 DIAGNOSIS — I5032 Chronic diastolic (congestive) heart failure: Secondary | ICD-10-CM

## 2021-06-28 DIAGNOSIS — I2510 Atherosclerotic heart disease of native coronary artery without angina pectoris: Secondary | ICD-10-CM | POA: Diagnosis not present

## 2021-06-28 DIAGNOSIS — I4892 Unspecified atrial flutter: Secondary | ICD-10-CM | POA: Diagnosis not present

## 2021-06-28 DIAGNOSIS — Z950 Presence of cardiac pacemaker: Secondary | ICD-10-CM | POA: Diagnosis not present

## 2021-06-28 DIAGNOSIS — I495 Sick sinus syndrome: Secondary | ICD-10-CM

## 2021-06-28 MED ORDER — TORSEMIDE 20 MG PO TABS: 20.0000 mg | ORAL_TABLET | Freq: Every day | ORAL | 3 refills | Status: DC

## 2021-06-28 NOTE — Anesthesia Preprocedure Evaluation (Addendum)
Anesthesia Evaluation  Patient identified by MRN, date of birth, ID band Patient awake    Reviewed: Allergy & Precautions, H&P , NPO status , Patient's Chart, lab work & pertinent test results  Airway Mallampati: II   Neck ROM: full    Dental   Pulmonary sleep apnea , former smoker,    breath sounds clear to auscultation       Cardiovascular hypertension, + CAD, + Past MI, + Cardiac Stents and +CHF  + dysrhythmias Atrial Fibrillation + pacemaker  Rhythm:regular Rate:Normal     Neuro/Psych PSYCHIATRIC DISORDERS Anxiety Depression    GI/Hepatic hiatal hernia, GERD  ,  Endo/Other  Hypothyroidism   Renal/GU      Musculoskeletal  (+) Arthritis ,   Abdominal   Peds  Hematology   Anesthesia Other Findings   Reproductive/Obstetrics                            Anesthesia Physical Anesthesia Plan  ASA: 3  Anesthesia Plan: General   Post-op Pain Management: Regional block   Induction: Intravenous  PONV Risk Score and Plan: 2 and Ondansetron, Dexamethasone and Treatment may vary due to age or medical condition  Airway Management Planned: Oral ETT  Additional Equipment:   Intra-op Plan:   Post-operative Plan: Extubation in OR  Informed Consent: I have reviewed the patients History and Physical, chart, labs and discussed the procedure including the risks, benefits and alternatives for the proposed anesthesia with the patient or authorized representative who has indicated his/her understanding and acceptance.     Dental advisory given  Plan Discussed with: CRNA, Anesthesiologist and Surgeon  Anesthesia Plan Comments: (PAT note written 06/28/2021 by Myra Gianotti, PA-C. )       Anesthesia Quick Evaluation

## 2021-06-28 NOTE — Patient Instructions (Signed)
Medication Instructions:  No changes *If you need a refill on your cardiac medications before your next appointment, please call your pharmacy*   Lab Work: none   Testing/Procedures: none   Follow-Up: At [facility], you and your health needs are our priority.  As part of our continuing mission to provide you with exceptional heart care, we have created designated Provider Care Teams.  These Care Teams include your primary Cardiologist (physician) and Advanced Practice Providers (APPs -  Physician Assistants and Nurse Practitioners) who all work together to provide you with the care you need, when you need it.   Your next appointment:   12 month(s)  The format for your next appointment:   In Person  Provider:   [name], MD     Other Instructions

## 2021-06-28 NOTE — Progress Notes (Signed)
Chief Complaint  Patient presents with   Follow-up    Dyspnea      History of Present Illness: 80 yo male with history of CAD, HTN, HLD, sleep apnea, sinus node dysfunction s/p pacemaker, atrial flutter here today for follow up. Cardiac caths in 2009 and 2014 with moderate non-obstructive CAD. He has not tolerated statins. He was admitted to [facility] April 2016 with unstable angina. Cardiac cath with severe disease in the LAD and RCA, both treated with drug eluting stents. Repeat cath May 2016 due to dyspnea with stable disease. Symptoms resolved off of Brilinta. Cardiac monitor June 2016 with PACs, PVCs. He has had severe anemia due to angiodysplasia. Cardiac monitor May 2017 with nocturnal bradycardia with rates as low as 22 bpm with blocked PACs. Echo June 2017 with normal LV systolic function, grade 1 diastolic dysfunction, moderate LVH. He was seen in the EP clinic by Dr. Lennie Odor 12/30/15 and sleep study was arranged but he did not keep this appt. He did not tolerate Norvasc due to LE edema and did not tolerate beta blockers due to bradycardia. I saw him 12/03/16 and he c/o episodes of dizziness but no palpitations. I arranged a cardiac monitor which showed atrial fib/flutter and long pauses. Xarelto was started. Plavix was stopped. He was seen by EP and a pacemaker was implanted on 12/24/16. Synthroid increased due to high TSH. Following his pacemaker implantation, he has c/o dry cough and he was treated with a round of antibiotics by primary care. He also had c/o chest pain and dizziness. He was seen in our office 01/08/17 by Melina Copa, PA-C and based on is complaints, he was admitted to [facility] from our office. Echo 01/08/17 with normal LV size and function and no valve issues. Nuclear stress test showed a large apical/anteroapical and inferoapical scar but no ischemia. D-dimer negative. BNP was normal. Troponin negative. I saw him in my office 01/14/17 and he felt terrible with c/o weakness, fatigue,  aching in legs, rare chest pains. TSH was elevated in June and Synthroid was increased. He was seen in primary care 01/16/17 and no new recommendations given. Carotid dopplers June 2017 with mild bllateral disease. Nuclear stress test July 2020 with no ischemia. Echo July 2020 with LVEF=60-65%, no valve disease. I saw him by video visit January 2021 and he was doing well. He had stopped his Eliquis due to insurance issues but we restarted at that visit. He was seen in June 2022 with worsened dyspnea. He had been seen in the ED 12/20/20 with c/o dyspnea and orthopnea. Reported not taking Lasix because it made him urinate too much. BNP was normal. Troponin was normal. Chest x-ray and EKG ok. Was given IV Lasix in the ED and felt better. He was seen in the ED again 12/25/20 after reporting a reaction with hives after taking oral Lasix at home. He has been on torsemide.   He is here today for follow up. The patient denies any chest pain, palpitations, lower extremity edema, orthopnea, PND, dizziness, near syncope or syncope. He has baseline dyspnea. Upcoming hernia surgery. He has irregular bowel habits lately and has gained some weight but no LE edema.    Primary Care Physician: [name], MD  Past Medical History:  Diagnosis Date   Adenomatous polyp of colon 2007   Arthritis    Atrial flutter ([place])    AVM (arteriovenous malformation) 2011   a. S/p argon plasma coagulation and ablation in 2011, 2017.  Bradycardia    a. H/o almost 7sec pause nocturnally during 2011 admission. Also has h/o fatigue with BB.   CAD (coronary artery disease)    a. Nonobst disease by cath 2009. b. s/p PCI 10/2014 with DES to [place], patent by relook 11/2014 (Brilinta changed to Plavix with improved sx).   Chronic diastolic CHF (congestive heart failure) (HCC)    Depression    Diverticulosis    Gastritis 2011   GERD (gastroesophageal reflux disease)    History of hiatal hernia    Hyperlipidemia    Hypertension     Hypothyroidism    Myocardial infarction ([place])    Obstructive sleep apnea    -no cpap use   Pre-diabetes    Premature atrial contractions Holter 2016   Presence of permanent cardiac pacemaker    PVC's (premature ventricular contractions) Holter 2016   Statin intolerance    Transfusion history    several years ago -GI bleed    Past Surgical History:  Procedure Laterality Date   ANKLE SURGERY Left    APPENDECTOMY     CARDIAC CATHETERIZATION N/A 11/26/2014   Procedure: Left Heart Cath and Coronary Angiography;  Surgeon: Burnell Blanks, MD;  Location: [place] [facility];  Service: Cardiovascular;  Laterality: N/A;   CHOLECYSTECTOMY N/A 08/23/2016   Procedure: LAPAROSCOPIC CHOLECYSTECTOMY;  Surgeon: Coralie Keens, MD;  Location: [facility];  Service: General;  Laterality: N/A;   COLONOSCOPY  08-23-05   per Dr. Deatra Ina, adenomatous polyps, repeat in 5 yrs    COLONOSCOPY WITH PROPOFOL N/A 12/06/2015   Procedure: COLONOSCOPY WITH PROPOFOL;  Surgeon: Doran Stabler, MD;  Location: [facility];  Service: Gastroenterology;  Laterality: N/A;   ENTEROSCOPY N/A 12/06/2015   Procedure: ENTEROSCOPY;  Surgeon: Doran Stabler, MD;  Location: [facility];  Service: Gastroenterology;  Laterality: N/A;   ESOPHAGOGASTRODUODENOSCOPY  08-23-05   per Dr. Deatra Ina, cauterized jejunal AVMs    [place] [facility] N/A 12/06/2015   Procedure: HOT HEMOSTASIS (ARGON PLASMA COAGULATION/BICAP);  Surgeon: Doran Stabler, MD;  Location: [facility];  Service: Gastroenterology;  Laterality: N/A;   LEFT HEART CATHETERIZATION WITH CORONARY ANGIOGRAM N/A 09/08/2012   Procedure: LEFT HEART CATHETERIZATION WITH CORONARY ANGIOGRAM;  Surgeon: Burnell Blanks, MD;  [facility];  Service: Cardiovascular;  Laterality: N/A;   LEFT HEART CATHETERIZATION WITH CORONARY ANGIOGRAM N/A 11/16/2014   Procedure: LEFT HEART CATHETERIZATION WITH CORONARY ANGIOGRAM;  Surgeon: Leonie Man, MD;  Location: [facility]  CATH LAB;  Service: Cardiovascular;  Laterality: N/A;   PACEMAKER IMPLANT N/A 12/24/2016   Procedure: Pacemaker Implant;  Surgeon: Deboraha Sprang, MD;  Location: [place] [facility];  Service: Cardiovascular;  Laterality: N/A;   SKIN GRAFT Right    leg   TONSILLECTOMY      Current Outpatient Medications  Medication Sig Dispense Refill   apixaban (ELIQUIS) 5 MG TABS tablet Take 1 tablet (5 mg total) by mouth 2 (two) times daily. 180 tablet 3   Ascorbic Acid (VITAMIN C PO) Take 4 tablets by mouth daily as needed (flu like symptopms).      diclofenac sodium (VOLTAREN) 1 % GEL Apply 1 application topically daily as needed (ankle pain). 5 Tube 10   diltiazem (CARDIZEM CD) 120 MG 24 hr capsule TAKE 1 CAPSULE BY MOUTH EVERY DAY 90 capsule 3   fluticasone (FLONASE) 50 MCG/ACT nasal spray Place 1 spray into both nostrils daily as needed for allergies or rhinitis. (  Patient taking differently: Place 1 spray into both nostrils daily.) 16 g 11   gabapentin (NEURONTIN) 100 MG capsule TAKE 1 CAPSULE BY MOUTH THREE TIMES A DAY 90 capsule 5   levothyroxine (SYNTHROID) 200 MCG tablet Take 1 tablet (200 mcg total) by mouth daily. 90 tablet 3   magnesium oxide (MAG-OX) 400 MG tablet Take 400 mg by mouth daily as needed (restless leg).     nitroGLYCERIN (NITROSTAT) 0.4 MG SL tablet Place 1 tablet (0.4 mg total) under the tongue every 5 (five) minutes as needed for chest pain. 25 tablet 3   omeprazole (PRILOSEC) 40 MG capsule Take 1 capsule (40 mg total) by mouth daily. 90 capsule 3   [START ON 08/05/2021] oxycodone (ROXICODONE) 30 MG immediate release tablet Take 1 tablet (30 mg total) by mouth every 6 (six) hours as needed for pain. 120 tablet 0   ropinirole (REQUIP) 5 MG tablet Take 5 mg by mouth in the morning, at noon, and at bedtime.     vitamin B-12 (CYANOCOBALAMIN) 100 MCG tablet Take 100 mcg by mouth daily.     clotrimazole (LOTRIMIN) 1 % cream Apply 1 application topically daily.     fluconazole (DIFLUCAN)  150 MG tablet Take 1 tablet (150 mg total) by mouth daily. (Patient not taking: Reported on 06/21/2021) 14 tablet 1   pramipexole (MIRAPEX) 0.5 MG tablet Take 1 tablet (0.5 mg total) by mouth 3 (three) times daily. (Patient not taking: Reported on 06/21/2021) 90 tablet 5   terbinafine (LAMISIL) 250 MG tablet Take 1 tablet (250 mg total) by mouth daily. (Patient not taking: Reported on 06/21/2021) 30 tablet 5   torsemide (DEMADEX) 20 MG tablet Take 1 tablet (20 mg total) by mouth daily. 90 tablet 3   No current facility-administered medications for this visit.    Allergies  Allergen Reactions   Brilinta [Ticagrelor] Shortness Of Breath   Colchicine     Syncope-causes patient to pass out   Metoprolol Tartrate Other (See Comments)    Severe chest pains " flat lined patient"   Mirapex [Pramipexole Dihydrochloride]     Severe leg pain   Shellfish Allergy Anaphylaxis and Hives   Statins Other (See Comments)    All statins cause myalgias    Zolpidem Other (See Comments)    Chest pain   Coconut Oil Hives   Lasix [Furosemide] Hives and Itching   Levaquin [Levofloxacin] Hives   Trazodone And Nefazodone Other (See Comments)    Unsteady on feet    Social History   Socioeconomic History   Marital status: Widowed    Spouse name: Not on file   Number of children: 1   Years of education: 16   Highest education level: 12th grade  Occupational History   Occupation: Disabled    Employer: UNEMPLOYED  Tobacco Use   Smoking status: Former    Packs/day: 2.00    Years: 50.00    Pack years: 100.00    Types: Cigarettes    Quit date: 07/23/2002    Years since quitting: 18.9    Passive exposure: Past   Smokeless tobacco: Never  Vaping Use   Vaping Use: Never used  Substance and Sexual Activity   Alcohol use: No    Alcohol/week: 0.0 standard drinks   Drug use: No   Sexual activity: Yes  Other Topics Concern   Not on file  Social History Narrative   Not on file   Social Determinants of  Health   Financial Resource Strain: Low  Risk    Difficulty of Paying Living Expenses: Not very hard  Food Insecurity: No Food Insecurity   Worried About Running Out of Food in the Last Year: Never true   Ran Out of Food in the Last Year: Never true  Transportation Needs: No Transportation Needs   Lack of Transportation (Medical): No   Lack of Transportation (Non-Medical): No  Physical Activity: Inactive   Days of Exercise per Week: 0 days   Minutes of Exercise per Session: 0 min  Stress: No Stress Concern Present   Feeling of Stress : Not at all  Social Connections: Moderately Isolated   Frequency of Communication with Friends and Family: More than three times a week   Frequency of Social Gatherings with Friends and Family: More than three times a week   Attends Religious Services: More than 4 times per year   Active Member of [employer] or Organizations: No   Attends Archivist Meetings: Never   Marital Status: Widowed  Human resources officer Violence: Not At Risk   Fear of Current or Ex-Partner: No   Emotionally Abused: No   Physically Abused: No   Sexually Abused: No    Family History  Problem Relation Age of Onset   Leukemia Mother    Heart disease Father    Stroke Father    Heart attack Father    Alcoholism Paternal Uncle    Alcoholism Maternal Grandfather    Colon cancer Neg Hx    Esophageal cancer Neg Hx     Review of Systems:  As stated in the HPI and otherwise negative.   BP (!) 156/86 (BP Location: Left Arm, Patient Position: Sitting, Cuff Size: Normal) Comment: automatic cuff  Pulse (!) 56   Ht 5\' 10"  (1.778 m)   Wt 231 lb 3.2 oz (104.9 kg)   SpO2 97%   BMI 33.17 kg/m   Physical Examination: General: Well developed, well nourished, NAD  HEENT: OP clear, mucus membranes moist  SKIN: warm, dry. No rashes. Neuro: No focal deficits  Musculoskeletal: Muscle strength 5/5 all ext  Psychiatric: Mood and affect normal  Neck: No JVD, no carotid bruits, no  thyromegaly, no lymphadenopathy.  Lungs:Clear bilaterally, no wheezes, rhonci, crackles Cardiovascular: Regular rate and rhythm. No murmurs, gallops or rubs. Abdomen:Soft. Bowel sounds present. Non-tender.  Extremities: No lower extremity edema. Pulses are 2 + in the bilateral DP/PT.  Echo 01/08/17: Left ventricle: The cavity size was normal. Wall thickness was   increased in a pattern of mild LVH. Systolic function was normal.   The estimated ejection fraction was in the range of 60% to 65%.   Wall motion was normal; there were no regional wall motion   abnormalities. Doppler parameters are consistent with abnormal   left ventricular relaxation (grade 1 diastolic dysfunction). - Aortic valve: Transvalvular velocity was within the normal range.   There was no stenosis. There was no regurgitation. - Mitral valve: Mildly calcified annulus. Transvalvular velocity   was within the normal range. There was no evidence for stenosis.   There was no regurgitation. - Left atrium: The atrium was mildly dilated. - Right ventricle: The cavity size was normal. Wall thickness was   normal. Systolic function was normal. - Atrial septum: No defect or patent foramen ovale was identified. - Tricuspid valve: There was trivial regurgitation.  Cardiac cath 11/26/14: Left Anterior Descending    Ost LAD to Prox LAD lesion, 50% stenosed. discrete . The lesion was not previously treated.    Prox  LAD to Mid LAD lesion, 0% stenosed. Previously placed Prox LAD to Mid LAD stent (unknown type) is patent.    Dist LAD lesion, 25% stenosed.       Left Circumflex    Mid Cx lesion, 30% stenosed.      Right Coronary Artery    Prox RCA lesion, 20% stenosed.    Mid RCA lesion, 40% stenosed.    Mid RCA to Dist RCA lesion, 30% stenosed.    Right Posterior Descending Artery    RPDA lesion, 0% stenosed. Previously placed RPDA drug eluting stent is patent.     EKG:  EKG is not  ordered today. The ekg ordered today  demonstrates    Recent Labs: 12/20/2020: ALT 16; B Natriuretic Peptide 33.2 03/30/2021: TSH 0.12 06/26/2021: BUN 13; Creatinine, Ser 1.11; Hemoglobin 14.4; Platelets 245; Potassium 4.0; Sodium 135   Lipid Panel    Component Value Date/Time   CHOL 148 12/11/2017 0837   TRIG 113 12/11/2017 0837   HDL 38 (L) 12/11/2017 0837   CHOLHDL 3.9 12/11/2017 0837   CHOLHDL 4.6 03/22/2016 0935   VLDL 46 (H) 03/22/2016 0935   LDLCALC 87 12/11/2017 0837     Wt Readings from Last 3 Encounters:  06/28/21 231 lb 3.2 oz (104.9 kg)  06/26/21 (P) 222 lb 4.8 oz (100.8 kg)  06/05/21 211 lb (95.7 kg)     Other studies Reviewed: Additional studies/ records that were reviewed today include: . Review of the above records demonstrates:    Assessment and Plan:   1. CAD with stable angina: He has no chest pain. Nuclear stress test without ischemia July 2020. He has been off of ASA since restarting Eliquis. He is statin intolerant. He stopped the Zetia. He does not wish to restart. He refuses to take beta blockers.    2. Chronic diastolic CHF:  No volume overload on exam. Continue Torsemide      3. Hyperlipidemia: Intolerant of statins. He does not wish to take Zetia. He does not wish to be referred to the lipid clinic for any other therapies.    4. Sinus node dysfunction: s/p pacemaker.  Followed by Dr. [name].    5. Atrial flutter, paroxysmal: Sinus today on exam. He is followed by Dr. [name]. Continue Cardizem and Eliquis.   Current medicines are reviewed at length with the patient today.  The patient does not have concerns regarding medicines.  The following changes have been made:  no change  Labs/ tests ordered today include:   No orders of the defined types were placed in this encounter.  Disposition:   FU with me in 12 months    Signed, [name], MD 06/28/2021 10:56 AM    [facility] [address], [place], [place]  [address] Phone: [phone]; Fax:  [phone]

## 2021-06-29 ENCOUNTER — Ambulatory Visit (HOSPITAL_COMMUNITY): Payer: Medicare Other | Admitting: Vascular Surgery

## 2021-06-29 ENCOUNTER — Other Ambulatory Visit: Payer: Self-pay

## 2021-06-29 ENCOUNTER — Ambulatory Visit (HOSPITAL_COMMUNITY): Payer: Medicare Other | Admitting: Anesthesiology

## 2021-06-29 ENCOUNTER — Encounter (HOSPITAL_COMMUNITY)
Admission: RE | Disposition: A | Discharge status: Home / Self Care | Payer: Self-pay | Source: Home / Self Care | Attending: General Surgery

## 2021-06-29 ENCOUNTER — Ambulatory Visit (HOSPITAL_COMMUNITY)
Admission: RE | Admit: 2021-06-29 | Discharge: 2021-06-29 | Disposition: A | Discharge status: Home / Self Care | Payer: Medicare Other | Attending: General Surgery | Admitting: General Surgery

## 2021-06-29 ENCOUNTER — Encounter (HOSPITAL_COMMUNITY): Payer: Self-pay | Admitting: General Surgery

## 2021-06-29 DIAGNOSIS — D176 Benign lipomatous neoplasm of spermatic cord: Secondary | ICD-10-CM | POA: Diagnosis not present

## 2021-06-29 DIAGNOSIS — K4090 Unilateral inguinal hernia, without obstruction or gangrene, not specified as recurrent: Secondary | ICD-10-CM | POA: Diagnosis not present

## 2021-06-29 DIAGNOSIS — K429 Umbilical hernia without obstruction or gangrene: Secondary | ICD-10-CM | POA: Diagnosis not present

## 2021-06-29 DIAGNOSIS — D638 Anemia in other chronic diseases classified elsewhere: Secondary | ICD-10-CM | POA: Diagnosis not present

## 2021-06-29 DIAGNOSIS — E039 Hypothyroidism, unspecified: Secondary | ICD-10-CM | POA: Diagnosis not present

## 2021-06-29 HISTORY — PX: INGUINAL HERNIA REPAIR: SHX194

## 2021-06-29 HISTORY — PX: UMBILICAL HERNIA REPAIR: SHX196

## 2021-06-29 LAB — CUP PACEART REMOTE DEVICE CHECK
Battery Remaining Longevity: 117 mo
Battery Voltage: 2.99 V
Brady Statistic AP VP Percent: 10.74 %
Brady Statistic AP VS Percent: 16.88 %
Brady Statistic AS VP Percent: 14.83 %
Brady Statistic AS VS Percent: 57.54 %
Brady Statistic RA Percent Paced: 26.52 %
Brady Statistic RV Percent Paced: 25.62 %
Date Time Interrogation Session: 20221207235412
Implantable Lead Implant Date: 20180604
Implantable Lead Implant Date: 20180604
Implantable Lead Location: 753859
Implantable Lead Location: 753860
Implantable Lead Model: 5076
Implantable Lead Model: 5076
Implantable Pulse Generator Implant Date: 20180604
Lead Channel Impedance Value: 304 Ohm
Lead Channel Impedance Value: 342 Ohm
Lead Channel Impedance Value: 399 Ohm
Lead Channel Impedance Value: 418 Ohm
Lead Channel Pacing Threshold Amplitude: 0.625 V
Lead Channel Pacing Threshold Amplitude: 0.875 V
Lead Channel Pacing Threshold Pulse Width: 0.4 ms
Lead Channel Pacing Threshold Pulse Width: 0.4 ms
Lead Channel Sensing Intrinsic Amplitude: 1.625 mV
Lead Channel Sensing Intrinsic Amplitude: 1.625 mV
Lead Channel Sensing Intrinsic Amplitude: 5.25 mV
Lead Channel Sensing Intrinsic Amplitude: 5.25 mV
Lead Channel Setting Pacing Amplitude: 1.5 V
Lead Channel Setting Pacing Amplitude: 2.5 V
Lead Channel Setting Pacing Pulse Width: 0.4 ms
Lead Channel Setting Sensing Sensitivity: 1.2 mV

## 2021-06-29 SURGERY — REPAIR, HERNIA, UMBILICAL, LAPAROSCOPIC
Anesthesia: General | Site: Abdomen

## 2021-06-29 MED ORDER — ONDANSETRON HCL 4 MG/2ML IJ SOLN: 4.0000 mg | Freq: Four times a day (QID) | INTRAMUSCULAR | Status: DC | PRN

## 2021-06-29 MED ORDER — BUPIVACAINE HCL (PF) 0.25 % IJ SOLN
INTRAMUSCULAR | Status: AC
  Filled 2021-06-29: qty 30

## 2021-06-29 MED ORDER — LIDOCAINE 2% (20 MG/ML) 5 ML SYRINGE
INTRAMUSCULAR | Status: DC | PRN
  Administered 2021-06-29: 60 mg via INTRAVENOUS

## 2021-06-29 MED ORDER — ACETAMINOPHEN 500 MG PO TABS
1000.0000 mg | ORAL_TABLET | ORAL | Status: AC
  Administered 2021-06-29: 1000 mg via ORAL
  Filled 2021-06-29: qty 2

## 2021-06-29 MED ORDER — LACTATED RINGERS IV SOLN: INTRAVENOUS | Status: DC

## 2021-06-29 MED ORDER — CEFAZOLIN SODIUM-DEXTROSE 2-4 GM/100ML-% IV SOLN
2.0000 g | INTRAVENOUS | Status: AC
  Administered 2021-06-29: 2 g via INTRAVENOUS
  Filled 2021-06-29: qty 100

## 2021-06-29 MED ORDER — PROPOFOL 10 MG/ML IV BOLUS
INTRAVENOUS | Status: AC
  Filled 2021-06-29: qty 20

## 2021-06-29 MED ORDER — OXYCODONE HCL 5 MG PO TABS: 5.0000 mg | ORAL_TABLET | Freq: Once | ORAL | Status: DC | PRN

## 2021-06-29 MED ORDER — 0.9 % SODIUM CHLORIDE (POUR BTL) OPTIME
TOPICAL | Status: DC | PRN
  Administered 2021-06-29: 1000 mL

## 2021-06-29 MED ORDER — CEFAZOLIN SODIUM-DEXTROSE 2-4 GM/100ML-% IV SOLN
INTRAVENOUS | Status: AC
  Filled 2021-06-29: qty 100

## 2021-06-29 MED ORDER — PROPOFOL 10 MG/ML IV BOLUS
INTRAVENOUS | Status: DC | PRN
  Administered 2021-06-29: 10 mg via INTRAVENOUS
  Administered 2021-06-29: 120 mg via INTRAVENOUS

## 2021-06-29 MED ORDER — SUGAMMADEX SODIUM 200 MG/2ML IV SOLN
INTRAVENOUS | Status: DC | PRN
  Administered 2021-06-29: 200 mg via INTRAVENOUS

## 2021-06-29 MED ORDER — CHLORHEXIDINE GLUCONATE 0.12 % MT SOLN
15.0000 mL | Freq: Once | OROMUCOSAL | Status: AC
  Administered 2021-06-29: 15 mL via OROMUCOSAL
  Filled 2021-06-29: qty 15

## 2021-06-29 MED ORDER — FENTANYL CITRATE (PF) 250 MCG/5ML IJ SOLN
INTRAMUSCULAR | Status: AC
  Filled 2021-06-29: qty 5

## 2021-06-29 MED ORDER — BUPIVACAINE HCL 0.25 % IJ SOLN
INTRAMUSCULAR | Status: DC | PRN
  Administered 2021-06-29: 9 mL

## 2021-06-29 MED ORDER — ROCURONIUM BROMIDE 10 MG/ML (PF) SYRINGE
PREFILLED_SYRINGE | INTRAVENOUS | Status: DC | PRN
  Administered 2021-06-29: 50 mg via INTRAVENOUS

## 2021-06-29 MED ORDER — CHLORHEXIDINE GLUCONATE CLOTH 2 % EX PADS: 6.0000 | MEDICATED_PAD | Freq: Once | CUTANEOUS | Status: DC

## 2021-06-29 MED ORDER — DILTIAZEM HCL 60 MG PO TABS
120.0000 mg | ORAL_TABLET | Freq: Once | ORAL | Status: AC
  Administered 2021-06-29: 120 mg via ORAL
  Filled 2021-06-29: qty 2

## 2021-06-29 MED ORDER — EPHEDRINE SULFATE-NACL 50-0.9 MG/10ML-% IV SOSY
PREFILLED_SYRINGE | INTRAVENOUS | Status: DC | PRN
  Administered 2021-06-29: 10 mg via INTRAVENOUS

## 2021-06-29 MED ORDER — ASPIRIN 81 MG PO CHEW
CHEWABLE_TABLET | ORAL | Status: AC
  Filled 2021-06-29: qty 1

## 2021-06-29 MED ORDER — ENSURE PRE-SURGERY PO LIQD: 296.0000 mL | Freq: Once | ORAL | Status: DC

## 2021-06-29 MED ORDER — PHENYLEPHRINE 40 MCG/ML (10ML) SYRINGE FOR IV PUSH (FOR BLOOD PRESSURE SUPPORT)
PREFILLED_SYRINGE | INTRAVENOUS | Status: DC | PRN
  Administered 2021-06-29: 120 ug via INTRAVENOUS
  Administered 2021-06-29: 80 ug via INTRAVENOUS

## 2021-06-29 MED ORDER — FENTANYL CITRATE (PF) 100 MCG/2ML IJ SOLN: 25.0000 ug | INTRAMUSCULAR | Status: DC | PRN

## 2021-06-29 MED ORDER — ORAL CARE MOUTH RINSE: 15.0000 mL | Freq: Once | OROMUCOSAL | Status: AC

## 2021-06-29 MED ORDER — OXYCODONE HCL 5 MG/5ML PO SOLN: 5.0000 mg | Freq: Once | ORAL | Status: DC | PRN

## 2021-06-29 MED ORDER — ONDANSETRON HCL 4 MG/2ML IJ SOLN
INTRAMUSCULAR | Status: DC | PRN
  Administered 2021-06-29: 4 mg via INTRAVENOUS

## 2021-06-29 MED ORDER — PHENYLEPHRINE HCL-NACL 20-0.9 MG/250ML-% IV SOLN
INTRAVENOUS | Status: DC | PRN
  Administered 2021-06-29: 25 ug/min via INTRAVENOUS

## 2021-06-29 MED ORDER — DEXAMETHASONE SODIUM PHOSPHATE 10 MG/ML IJ SOLN
4.0000 mg | INTRAMUSCULAR | Status: AC
  Administered 2021-06-29: 4 mg via INTRAVENOUS
  Filled 2021-06-29: qty 1

## 2021-06-29 MED ORDER — FENTANYL CITRATE (PF) 250 MCG/5ML IJ SOLN
INTRAMUSCULAR | Status: DC | PRN
  Administered 2021-06-29: 50 ug via INTRAVENOUS
  Administered 2021-06-29: 100 ug via INTRAVENOUS

## 2021-06-29 SURGICAL SUPPLY — 61 items
ADH SKN CLS APL DERMABOND .7 (GAUZE/BANDAGES/DRESSINGS) ×2
APL PRP STRL LF DISP 70% ISPRP (MISCELLANEOUS) ×2
BAG COUNTER SPONGE SURGICOUNT (BAG) ×3 IMPLANT
BAG SPNG CNTER NS LX DISP (BAG) ×2
BLADE CLIPPER SURG (BLADE) IMPLANT
CANISTER SUCT 3000ML PPV (MISCELLANEOUS) IMPLANT
CHLORAPREP W/TINT 26 (MISCELLANEOUS) ×3 IMPLANT
COVER SURGICAL LIGHT HANDLE (MISCELLANEOUS) ×3 IMPLANT
DERMABOND ADVANCED (GAUZE/BANDAGES/DRESSINGS) ×1
DERMABOND ADVANCED .7 DNX12 (GAUZE/BANDAGES/DRESSINGS) ×2 IMPLANT
DEVICE SECURE STRAP 25 ABSORB (INSTRUMENTS) ×3 IMPLANT
DEVICE TROCAR PUNCTURE CLOSURE (ENDOMECHANICALS) ×3 IMPLANT
DISSECTOR BLUNT TIP ENDO 5MM (MISCELLANEOUS) IMPLANT
ELECT REM PT RETURN 9FT ADLT (ELECTROSURGICAL) ×3
ELECTRODE REM PT RTRN 9FT ADLT (ELECTROSURGICAL) ×2 IMPLANT
GLOVE SURG ENC MOIS LTX SZ7.5 (GLOVE) ×3 IMPLANT
GOWN STRL REUS W/ TWL LRG LVL3 (GOWN DISPOSABLE) ×4 IMPLANT
GOWN STRL REUS W/ TWL XL LVL3 (GOWN DISPOSABLE) ×2 IMPLANT
GOWN STRL REUS W/TWL LRG LVL3 (GOWN DISPOSABLE) ×6
GOWN STRL REUS W/TWL XL LVL3 (GOWN DISPOSABLE) ×3
KIT BASIN OR (CUSTOM PROCEDURE TRAY) ×3 IMPLANT
KIT TURNOVER KIT B (KITS) ×3 IMPLANT
MARKER SKIN DUAL TIP RULER LAB (MISCELLANEOUS) ×3 IMPLANT
MESH 3DMAX 5X7 LT XLRG (Mesh General) ×1 IMPLANT
NDL INSUFFLATION 14GA 120MM (NEEDLE) ×2 IMPLANT
NDL SPNL 22GX3.5 QUINCKE BK (NEEDLE) IMPLANT
NEEDLE INSUFFLATION 14GA 120MM (NEEDLE) ×3 IMPLANT
NEEDLE SPNL 22GX3.5 QUINCKE BK (NEEDLE) IMPLANT
NS IRRIG 1000ML POUR BTL (IV SOLUTION) ×3 IMPLANT
PAD ARMBOARD 7.5X6 YLW CONV (MISCELLANEOUS) ×6 IMPLANT
PENCIL SMOKE EVACUATOR COATED (MISCELLANEOUS) ×1 IMPLANT
RELOAD STAPLE 4.0 BLU F/HERNIA (INSTRUMENTS) IMPLANT
RELOAD STAPLE 4.8 BLK F/HERNIA (STAPLE) IMPLANT
RELOAD STAPLE HERNIA 4.0 BLUE (INSTRUMENTS) ×3 IMPLANT
RELOAD STAPLE HERNIA 4.8 BLK (STAPLE) IMPLANT
SCISSORS LAP 5X35 DISP (ENDOMECHANICALS) ×3 IMPLANT
SET IRRIG TUBING LAPAROSCOPIC (IRRIGATION / IRRIGATOR) IMPLANT
SET TROCAR LAP APPLE-HUNT 5MM (ENDOMECHANICALS) ×1 IMPLANT
SET TUBE SMOKE EVAC HIGH FLOW (TUBING) ×3 IMPLANT
SLEEVE ENDOPATH XCEL 5M (ENDOMECHANICALS) ×3 IMPLANT
STAPLER HERNIA 12 8.5 360D (INSTRUMENTS) IMPLANT
SUT CHROMIC 2 0 SH (SUTURE) ×3 IMPLANT
SUT ETHIBOND 0 MO6 C/R (SUTURE) IMPLANT
SUT MNCRL AB 4-0 PS2 18 (SUTURE) ×3 IMPLANT
SUT NOVA 1 T20/GS 25DT (SUTURE) IMPLANT
SUT PROLENE 2 0 KS (SUTURE) ×6 IMPLANT
SUT VIC AB 1 CT1 27 (SUTURE)
SUT VIC AB 1 CT1 27XBRD ANBCTR (SUTURE) IMPLANT
SYRINGE TOOMEY DISP (SYRINGE) ×3 IMPLANT
TOWEL GREEN STERILE (TOWEL DISPOSABLE) ×3 IMPLANT
TOWEL GREEN STERILE FF (TOWEL DISPOSABLE) ×3 IMPLANT
TRAY FOLEY W/BAG SLVR 14FR (SET/KITS/TRAYS/PACK) ×3 IMPLANT
TRAY LAPAROSCOPIC MC (CUSTOM PROCEDURE TRAY) ×3 IMPLANT
TROCAR OPTICAL SHORT 5MM (TROCAR) ×3 IMPLANT
TROCAR OPTICAL SLV SHORT 5MM (TROCAR) ×3 IMPLANT
TROCAR XCEL 12X100 BLDLESS (ENDOMECHANICALS) ×3 IMPLANT
TROCAR XCEL BLUNT TIP 100MML (ENDOMECHANICALS) IMPLANT
TROCAR XCEL NON-BLD 11X100MML (ENDOMECHANICALS) IMPLANT
TROCAR XCEL NON-BLD 5MMX100MML (ENDOMECHANICALS) ×3 IMPLANT
WARMER LAPAROSCOPE (MISCELLANEOUS) ×3 IMPLANT
WATER STERILE IRR 1000ML POUR (IV SOLUTION) ×3 IMPLANT

## 2021-06-29 NOTE — Anesthesia Postprocedure Evaluation (Signed)
Anesthesia Post Note  Patient: [name]  Procedure(s) Performed: OPEN UMBILICAL HERNIA REPAIR WITH MESH (Abdomen) LAPAROSCOPIC LEFT INGUINAL HERNIA REPAIR WITH MESH (Left: Abdomen)     Patient location during evaluation: PACU Anesthesia Type: General Level of consciousness: awake and alert Pain management: pain level controlled Vital Signs Assessment: post-procedure vital signs reviewed and stable Respiratory status: spontaneous breathing, nonlabored ventilation, respiratory function stable and patient connected to nasal cannula oxygen Cardiovascular status: blood pressure returned to baseline and stable Postop Assessment: no apparent nausea or vomiting Anesthetic complications: no   No notable events documented.  Last Vitals:  Vitals:   06/29/21 1217 06/29/21 1218  BP:    Pulse: (!) 56 (!) 48  Resp: 14 15  Temp:  36.7 C  SpO2: 93% 97%    Last Pain:  Vitals:   06/29/21 1145  TempSrc:   PainSc: [place]

## 2021-06-29 NOTE — Op Note (Signed)
06/29/2021  8:55 AM  PATIENT:  [name]  80 y.o. male  PRE-OPERATIVE DIAGNOSIS:  UMBILICAL HERNIA AND LEFT INGUINAL HERNIA  POST-OPERATIVE DIAGNOSIS:  UMBILICAL HERNIA AND LEFT INDIRECT INGUINAL HERNIA AND CORD LIPOMA  PROCEDURE:  Procedure(s): OPEN UMBILICAL HERNIA REPAIR WITH MESH (N/A) LAPAROSCOPIC LEFT INGUINAL HERNIA REPAIR WITH MESH (Left)  SURGEON:  Surgeon(s) and Role:    Ralene Ok, MD - Primary  ASSISTANTS: Ewell Poe, RNFA   ANESTHESIA:   local and general  EBL:  minimal   BLOOD ADMINISTERED:none  DRAINS: none   LOCAL MEDICATIONS USED:  BUPIVICAINE   SPECIMEN:  No Specimen  DISPOSITION OF SPECIMEN:  N/A  COUNTS:  YES  TOURNIQUET:  * No tourniquets in log *  DICTATION: .Dragon Dictation Counts: reported as correct x 2   Findings:  The patient had a moderate sized left indirect hernia and a moderated sized cord lipoma   Indications for procedure:  The patient is a 80 year old male with a left inguinal hernia for several months. Patient complained of symptomatology to the left inguinal area. The patient was taken back for elective inguinal hernia repair.   Details of the procedure: The patient was taken back to the operating room. The patient was placed in supine position with bilateral SCDs in place.  The patient was prepped and draped in the usual sterile fashion.  After appropriate anitbiotics were confirmed, a time-out was confirmed and all facts were verified.   0.25% Marcaine was used to infiltrate the umbilical area. A 11-blade was used to cut down the skin and blunt dissection was used to get the anterior fashion.  The anterior fascia was incised approximately 1 cm and the muscles were retracted laterally. Blunt dissection was then used to create a space in the preperitoneal area. At this time a 10 mm camera was then introduced into the space and advanced the pubic tubercle and a 12 mm trocar was placed over this and insufflation was  started.  At this time and space was created from medial to laterally the preperitoneal space.  Cooper's ligament was initially cleaned off.  The hernia sac was identified in the indirect space. Dissection of the hernia sac and cord structures was undertaken the vas deferens was identified and protected in all parts of the case. There was a large cord lipoma that was reduced entirely.   Once the hernia sac was taken down to approximately the umbilicus a Left Bard 3D Max mesh, size: [name], was  introduced into the preperitoneal space.  The mesh was brought over to cover the direct and indirect hernia spaces.  This was anchored into place and secured to Cooper's ligament with 4.50mm staples from a [place] hernia stapler. It was anchored to the anterior abdominal wall with 4.8 mm staples. The hernia sac was seen lying posterior to the mesh. There was no staples placed laterally. The insufflation was evacuated and the peritoneum was seen posterior to the mesh. The trochars were removed. The anterior fascia was reapproximated using #1 Vicryl on a UR- 6.   The infraumbilical incision was used to repair the umbilical hernia.  Blunt dissection was taken down to the anterior fascia.  The umbilical stalk was then taken off the abdominal wall.  The fascia was then elevated at the umbilical hernia site.  The surrounding fascia was then cleaned out from the subcutaneous tissue.  At this time the peritoneum was entered bluntly.  There is no bowel within the hernia.  The hernia was  approximatley 1 cm. The fascia was reapproximated from the mesh using #1 ethibonds in an interrupted fashion.  The umbilical stalk was then secured to the anterior abdominal wall using a 3-0 Vicryl x1.  The skin was then reapproximated 4-0 Monocryl subcuticular fashion.  The skin was then dressed with Dermabond.  The patient tolerated procedure well was taken to the recovery in stable condition.  The skin was reapproximated using 4-0 Monocryl  subcuticular fashion and Dermabond. The patient was awakened from general anesthesia and taken to recovery in stable condition.   PLAN OF CARE: Discharge to home after PACU  PATIENT DISPOSITION:  PACU - hemodynamically stable.   Delay start of Pharmacological VTE agent (>24hrs) due to surgical blood loss or risk of bleeding: not applicable

## 2021-06-29 NOTE — Anesthesia Procedure Notes (Signed)
Procedure Name: Intubation Date/Time: 06/29/2021 7:56 AM Performed by: Wilburn Cornelia, CRNA Pre-anesthesia Checklist: Patient identified, Emergency Drugs available, Suction available, Timeout performed and Patient being monitored Patient Re-evaluated:Patient Re-evaluated prior to induction Oxygen Delivery Method: Circle system utilized Preoxygenation: Pre-oxygenation with 100% oxygen Induction Type: IV induction Ventilation: Mask ventilation without difficulty Laryngoscope Size: Mac and 4 Grade View: Grade III Tube type: Oral Tube size: 7.5 mm Number of attempts: 1 Airway Equipment and Method: Stylet Placement Confirmation: ETT inserted through vocal cords under direct vision, positive ETCO2, CO2 detector and breath sounds checked- equal and bilateral Secured at: 23 cm Tube secured with: Tape Dental Injury: Teeth and Oropharynx as per pre-operative assessment

## 2021-06-29 NOTE — Discharge Instructions (Signed)
[facility] _______[facility]  HERNIA REPAIR: POST OP INSTRUCTIONS  Always review your discharge instruction sheet given to you by the facility where your surgery was performed. IF YOU HAVE DISABILITY OR FAMILY LEAVE FORMS, YOU MUST BRING THEM TO THE OFFICE FOR PROCESSING.   DO NOT GIVE THEM TO YOUR DOCTOR.  1. A  prescription for pain medication may be given to you upon discharge.  Take your pain medication as prescribed, if needed.  If narcotic pain medicine is not needed, then you may take acetaminophen (Tylenol) or ibuprofen (Advil) as needed. 2. Take your usually prescribed medications unless otherwise directed. If you need a refill on your pain medication, please contact your pharmacy.  They will contact our office to request authorization. Prescriptions will not be filled after 5 pm or on week-ends. 3. You should follow a light diet the first 24 hours after arrival home, such as soup and crackers, etc.  Be sure to include lots of fluids daily.  Resume your normal diet the day after surgery. 4.Most patients will experience some swelling and bruising around the umbilicus or in the groin and scrotum.  Ice packs and reclining will help.  Swelling and bruising can take several days to resolve.  6. It is common to experience some constipation if taking pain medication after surgery.  Increasing fluid intake and taking a stool softener (such as Colace) will usually help or prevent this problem from occurring.  A mild laxative (Milk of Magnesia or Miralax) should be taken according to package directions if there are no bowel movements after 48 hours. 7. Unless discharge instructions indicate otherwise, you may remove your bandages 24-48 hours after surgery, and you may shower at that time.  You may have steri-strips (small skin tapes) in place directly over the incision.  These strips should be left on the skin for 7-10 days.  If your surgeon used skin glue on the incision, you may shower in  24 hours.  The glue will flake off over the next 2-3 weeks.  Any sutures or staples will be removed at the office during your follow-up visit. 8. ACTIVITIES:  You may resume regular (light) daily activities beginning the next day--such as daily self-care, walking, climbing stairs--gradually increasing activities as tolerated.  You may have sexual intercourse when it is comfortable.  Refrain from any heavy lifting or straining until approved by your doctor.  a.You may drive when you are no longer taking prescription pain medication, you can comfortably wear a seatbelt, and you can safely maneuver your car and apply brakes. b.RETURN TO WORK:   _____________________________________________  9.You should see your doctor in the office for a follow-up appointment approximately 2-3 weeks after your surgery.  Make sure that you call for this appointment within a day or two after you arrive home to insure a convenient appointment time. 10.OTHER INSTRUCTIONS: _________________________    _____________________________________  WHEN TO CALL YOUR DOCTOR: Fever over 101.0 Inability to urinate Nausea and/or vomiting Extreme swelling or bruising Continued bleeding from incision. Increased pain, redness, or drainage from the incision  The clinic staff is available to answer your questions during regular business hours.  Please don't hesitate to call and ask to speak to one of the nurses for clinical concerns.  If you have a medical emergency, go to the nearest emergency room or call 911.  A surgeon from [facility] is always on call at the hospital   [address], [address], [place], [place]    [address] ?  [address] [address], [place], [place]   [address] [phone] ? [phone] ? FAX [phone] Web site: [email or web address]  

## 2021-06-29 NOTE — H&P (Signed)
Chief Complaint: No chief complaint on file.   History of Present Illness: [name] is a 80 y.o. male who is seen today as an office consultation at the request of Dr. [name] for evaluation of No chief complaint on file. .  Patient is a 80 year old male who previously had been seen secondary to a left inguinal hernia. Patient comes in today secondary to left inguinal hernia, scrotal mass less likely secondary to hernia that seen on CT scan. Patient states he has significant pain to the area.  Patient also has an umbilical hernia from previous laparoscopic [place]. Patient would like to have both hernias repaired.  I did review the CT scan which does show a small left inguinal hernia. This does not extend down to the scrotum. He does have a mass in that area.  Review of Systems: A complete review of systems was obtained from the patient. I have reviewed this information and discussed as appropriate with the patient. See HPI as well for other ROS.  Review of Systems  Constitutional: Negative for fever.  HENT: Negative for congestion.  Eyes: Negative for blurred vision.  Respiratory: Negative for cough, shortness of breath and wheezing.  Cardiovascular: Negative for chest pain and palpitations.  Gastrointestinal: Negative for heartburn.  Genitourinary: Negative for dysuria.  Musculoskeletal: Negative for myalgias.  Skin: Negative for rash.  Neurological: Negative for dizziness and headaches.  Psychiatric/Behavioral: Negative for depression and suicidal ideas.  All other systems reviewed and are negative.   Medical History: Past Medical History:  Diagnosis Date   GERD (gastroesophageal reflux disease)   Heart valve disease   Sleep apnea   Thyroid disease   There is no problem list on file for this patient.  Past Surgical History:  Procedure Laterality Date   APPENDECTOMY   gallbladder surgery   TONSILLECTOMY    Allergies  Allergen Reactions   Metoprolol Other  (See Comments) and Palpitations  Severe chest pains " flat lined patient"   Pramipexole Other (See Comments)  Severe leg pain   Shellfish Containing Products Other (See Comments), Anaphylaxis and Hives   Ticagrelor Shortness Of Breath   Zolpidem Other (See Comments)  Chest pain   Furosemide Hives and Itching   Trazodone Other (See Comments)   Levofloxacin Hives   Current Outpatient Medications on File Prior to Visit  Medication Sig Dispense Refill   apixaban (ELIQUIS) 5 mg tablet TAKE ONE TABLET BY MOUTH TWICE A DAY (CAUTION: BLOOD THINNER) STOP ASPIRIN   diltiazem (CARDIZEM CD) 120 MG XR capsule diltiazem CD 120 mg capsule,extended release 24 hr   fluticasone propionate (FLONASE) 50 mcg/actuation nasal spray fluticasone propionate 50 mcg/actuation nasal spray,suspension   gabapentin (NEURONTIN) 100 MG capsule Take 1 capsule by mouth once daily   pramipexole (MIRAPEX) 0.5 MG tablet Take 0.5 mg by mouth 3 (three) times daily   ascorbic acid, vitamin C, 550 mg/1.1 gram (scoop) Powd Take by mouth   EUA PAXLOVID 300 mg (150 mg x 2)-100 mg tablet USE AS DIRECTED PER INSTRUCTIONS ON PACKAGE FOR 5 DAYS   hydrocodone-homatropine (HYCODAN) 5-1.5 mg/5 mL syrup TAKE 5 MILLILITERS BY MOUTH EVERY 4 HOURS AS NEEDED   levothyroxine (SYNTHROID) 100 MCG tablet levothyroxine 100 mcg tablet TAKE 1 TABLET BY MOUTH EVERY DAY BEFORE BREAKFAST   methylPREDNISolone (MEDROL DOSEPACK) 4 mg tablet TAKE 6 TABLETS ON DAY 1 AS DIRECTED ON PACKAGE AND DECREASE BY 1 TAB EACH DAY FOR A TOTAL OF 6 DAYS   nitroGLYcerin (NITROSTAT) 0.4 MG SL tablet  Place 0.4 mg under the tongue every 5 (five) minutes as needed   omeprazole (PRILOSEC) 20 MG DR capsule omeprazole 20 mg capsule,delayed release   oxyCODONE (DAZIDOX) 20 mg immediate release tablet oxycodone 20 mg tablet   terbinafine HCL (LAMISIL) 250 mg tablet Take 250 mg by mouth once daily   TORsemide (DEMADEX) 20 MG tablet Take 20 mg by mouth once daily   No current  facility-administered medications on file prior to visit.   Family History  Problem Relation Age of Onset   Diabetes Mother   Stroke Father   High blood pressure (Hypertension) Father   Hyperlipidemia (Elevated cholesterol) Father   Heart valve disease Father    Social History   Tobacco Use  Smoking Status Never Smoker  Smokeless Tobacco Never Used    Social History   Socioeconomic History   Marital status: Married  Tobacco Use   Smoking status: Never Smoker   Smokeless tobacco: Never Used  Substance and Sexual Activity   Alcohol use: Never   Drug use: Never   Objective:   Vitals:  05/10/21 1042  BP: 132/84  Pulse: 70  Temp: 36.7 C (98.1 F)  SpO2: 96%  Weight: 97.1 kg (214 lb)  Height: 175.3 cm (5\' 9" )   Body mass index is 31.6 kg/m.  Physical Exam Constitutional:  Appearance: Normal appearance.  HENT:  Head: Normocephalic and atraumatic.  Nose: Nose normal. No congestion.  Mouth/Throat:  Mouth: Mucous membranes are moist.  Pharynx: Oropharynx is clear.  Eyes:  Pupils: Pupils are equal, round, and reactive to light.  Cardiovascular:  Rate and Rhythm: Normal rate and regular rhythm.  Pulses: Normal pulses.  Heart sounds: Normal heart sounds. No murmur heard. No friction rub. No gallop.  Pulmonary:  Effort: Pulmonary effort is normal. No respiratory distress.  Breath sounds: Normal breath sounds. No stridor. No wheezing, rhonchi or rales.  Abdominal:  General: Abdomen is flat.  Hernia: A hernia is present. Hernia is present in the left inguinal area.  Comments: . Left scrotal mass likely secondary to extending hernia  Musculoskeletal:  General: Normal range of motion.  Cervical back: Normal range of motion.  Skin: General: Skin is warm and dry.  Neurological:  General: No focal deficit present.  Mental Status: He is alert and oriented to person, place, and time.  Psychiatric:  Mood and Affect: Mood normal.  Thought Content: Thought content  normal.     Assessment and Plan:  Diagnoses and all orders for this visit:  Umbilical hernia without obstruction or gangrene  Unilateral inguinal hernia without obstruction or gangrene, recurrence not specified  Will obtain an ultrasound of his scrotum to evaluate the mass in his left hemiscrotum. Patient also will need clearance from Dr. [name] to be off Eliquis and with his pacemaker.  [name] is a 80 y.o. male   1. We will proceed to the OR for a lap ventral hernia repair with mesh and left inguinal hernia repair with mesh. 2. All risks and benefits were discussed with the patient, to generally include infection, bleeding, damage to surrounding structures, acute and chronic nerve pain, and recurrence. Alternatives were offered and described. All questions were answered and the patient voiced understanding of the procedure and wishes to proceed at this point.  No follow-ups on file.  Ralene Ok, MD, [facility], [facility]

## 2021-06-29 NOTE — Transfer of Care (Signed)
Immediate Anesthesia Transfer of Care Note  Patient: [name]  Procedure(s) Performed: OPEN UMBILICAL HERNIA REPAIR WITH MESH (Abdomen) LAPAROSCOPIC LEFT INGUINAL HERNIA REPAIR WITH MESH (Left: Abdomen)  Patient Location: PACU  Anesthesia Type:General  Level of Consciousness: awake and alert   Airway & Oxygen Therapy: Patient Spontanous Breathing and Patient connected to nasal cannula oxygen  Post-op Assessment: Report given to RN and Post -op Vital signs reviewed and stable  Post vital signs: Reviewed and stable  Last Vitals:  Vitals Value Taken Time  BP 137/71 06/29/21 0915  Temp    Pulse 73 06/29/21 0916  Resp 19 06/29/21 0916  SpO2 92 % 06/29/21 0916  Vitals shown include unvalidated device data.  Last Pain:  Vitals:   06/29/21 0732  TempSrc:   PainSc: 0-No pain         Complications: No notable events documented.

## 2021-06-30 ENCOUNTER — Encounter (HOSPITAL_COMMUNITY): Payer: Self-pay | Admitting: General Surgery

## 2021-07-06 NOTE — Progress Notes (Signed)
Remote pacemaker transmission.   

## 2021-07-17 NOTE — Progress Notes (Signed)
Cardiology Office Note Date:  07/20/2021  Patient ID:  [name], [name] 20-Jan-1941, MRN [id number] PCP:  [name], MD  Cardiologist:  Dr. [name] Electrophysiologist: Dr. [name]    Chief Complaint:  over due  History of Present Illness: [name] is a 80 y.o. male with history of AFlutter, SND w/PPM, HTN, HLD, hypothyroidism, CAD (PCI to LAD/RCA 2016) anemia  w/hx of angiodysplasia  He comes in today to be seen for Dr. [name], last seen by him, back in 2018  Has followed with cardiology team, last saw Dr. [name] 06/28/21, He had been seen in the ED 12/20/20 with c/o dyspnea and orthopnea. Reported not taking Lasix because it made him urinate too much. BNP was normal. Troponin was normal. Chest x-ray and EKG ok. Was given IV Lasix in the ED and felt better. He was seen in the ED again 12/25/20 after reporting a reaction with hives after taking oral Lasix at home. He has been on torsemide.  Was pending an upcoming hernia surgery Pt apparently statin intolerant and stopped his zetia as well, did not want referral to the lipid clinic, refused to try betablockers  Not felt to be volume OL  TODAY He mentions occasionally some chest pressure this is few and far between, and says not new, not escalating He is SOB though No rest SOB, but says when he walks to the mailbox about 240feet he has to stop once/sometimes twice to catch his breath. Occasionally as well, wakes from sleep and has to sit up to tach his breath. He says none of this is new, but did not speak in detail at all with dr. [name] about it. The torsemide has helped the swelling, not sure if it has helped so much his DOE. Does not take the torsemide every day depending on what his errands/activities are He has fallen a couple times, he is not sure why but does not think he hsa fainted. Denies significant injury, just a scarp on his knee/elbow  No bleeding or signs of bleeding   Device information MDT dual  chamber PPM implanted 12/24/2016   Past Medical History:  Diagnosis Date   Adenomatous polyp of colon 2007   Arthritis    Atrial flutter ([place])    AVM (arteriovenous malformation) 2011   a. S/p argon plasma coagulation and ablation in 2011, 2017.   Bradycardia    a. H/o almost 7sec pause nocturnally during 2011 admission. Also has h/o fatigue with BB.   CAD (coronary artery disease)    a. Nonobst disease by cath 2009. b. s/p PCI 10/2014 with DES to [place], patent by relook 11/2014 (Brilinta changed to Plavix with improved sx).   Chronic diastolic CHF (congestive heart failure) ([place])    Depression    Diverticulosis    Gastritis 2011   GERD (gastroesophageal reflux disease)    History of hiatal hernia    Hyperlipidemia    Hypertension    Hypothyroidism    Myocardial infarction ([place])    Obstructive sleep apnea    -no cpap use   Pre-diabetes    Premature atrial contractions Holter 2016   Presence of permanent cardiac pacemaker    PVC's (premature ventricular contractions) Holter 2016   Statin intolerance    Transfusion history    several years ago -GI bleed    Past Surgical History:  Procedure Laterality Date   ANKLE SURGERY Left    APPENDECTOMY     CARDIAC CATHETERIZATION N/A 11/26/2014  Procedure: Left Heart Cath and Coronary Angiography;  Surgeon: Burnell Blanks, MD;  Location: [facility];  Service: Cardiovascular;  Laterality: N/A;   CHOLECYSTECTOMY N/A 08/23/2016   Procedure: LAPAROSCOPIC CHOLECYSTECTOMY;  Surgeon: Coralie Keens, MD;  Location: [place];  Service: General;  Laterality: N/A;   COLONOSCOPY  08-23-05   per Dr. Deatra Ina, adenomatous polyps, repeat in 5 yrs    COLONOSCOPY WITH PROPOFOL N/A 12/06/2015   Procedure: COLONOSCOPY WITH PROPOFOL;  Surgeon: Doran Stabler, MD;  Location: [facility];  Service: Gastroenterology;  Laterality: N/A;   ENTEROSCOPY N/A 12/06/2015   Procedure: ENTEROSCOPY;  Surgeon: Doran Stabler, MD;  Location: [facility]  ENDOSCOPY;  Service: Gastroenterology;  Laterality: N/A;   ESOPHAGOGASTRODUODENOSCOPY  08-23-05   per Dr. Deatra Ina, cauterized jejunal AVMs    [place] N/A 12/06/2015   Procedure: HOT HEMOSTASIS (ARGON PLASMA COAGULATION/BICAP);  Surgeon: Doran Stabler, MD;  Location: [facility];  Service: Gastroenterology;  Laterality: N/A;   INGUINAL HERNIA REPAIR Left 06/29/2021   Procedure: LAPAROSCOPIC LEFT INGUINAL HERNIA REPAIR WITH MESH;  Surgeon: Ralene Ok, MD;  Location: [place];  Service: General;  Laterality: Left;   LEFT HEART CATHETERIZATION WITH CORONARY ANGIOGRAM N/A 09/08/2012   Procedure: LEFT HEART CATHETERIZATION WITH CORONARY ANGIOGRAM;  Surgeon: Burnell Blanks, MD;  Location: [facility];  Service: Cardiovascular;  Laterality: N/A;   LEFT HEART CATHETERIZATION WITH CORONARY ANGIOGRAM N/A 11/16/2014   Procedure: LEFT HEART CATHETERIZATION WITH CORONARY ANGIOGRAM;  Surgeon: Leonie Man, MD;  Location: [facility];  Service: Cardiovascular;  Laterality: N/A;   PACEMAKER IMPLANT N/A 12/24/2016   Procedure: Pacemaker Implant;  Surgeon: Deboraha Sprang, MD;  Location: [facility];  Service: Cardiovascular;  Laterality: N/A;   SKIN GRAFT Right    leg   TONSILLECTOMY     UMBILICAL HERNIA REPAIR N/A 06/29/2021   Procedure: OPEN UMBILICAL HERNIA REPAIR WITH MESH;  Surgeon: Ralene Ok, MD;  Location: [place];  Service: General;  Laterality: N/A;    Current Outpatient Medications  Medication Sig Dispense Refill   apixaban (ELIQUIS) 5 MG TABS tablet Take 1 tablet (5 mg total) by mouth 2 (two) times daily. 180 tablet 3   Ascorbic Acid (VITAMIN C PO) Take 4 tablets by mouth daily as needed (flu like symptopms).      clotrimazole (LOTRIMIN) 1 % cream Apply 1 application topically daily.     diclofenac sodium (VOLTAREN) 1 % GEL Apply 1 application topically daily as needed (ankle pain). 5 Tube 10   diltiazem (CARDIZEM CD) 120 MG 24 hr capsule TAKE 1  CAPSULE BY MOUTH EVERY DAY 90 capsule 3   fluticasone (FLONASE) 50 MCG/ACT nasal spray Place 1 spray into both nostrils daily as needed for allergies or rhinitis. 16 g 11   gabapentin (NEURONTIN) 100 MG capsule TAKE 1 CAPSULE BY MOUTH THREE TIMES A DAY 90 capsule 5   levothyroxine (SYNTHROID) 200 MCG tablet Take 1 tablet (200 mcg total) by mouth daily. 90 tablet 3   magnesium oxide (MAG-OX) 400 MG tablet Take 400 mg by mouth daily as needed (restless leg).     nitroGLYCERIN (NITROSTAT) 0.4 MG SL tablet Place 1 tablet (0.4 mg total) under the tongue every 5 (five) minutes as needed for chest pain. 25 tablet 3   omeprazole (PRILOSEC) 40 MG capsule Take 1 capsule (40 mg total) by mouth daily. 90 capsule 3   [START ON 08/05/2021] oxycodone (ROXICODONE)  30 MG immediate release tablet Take 1 tablet (30 mg total) by mouth every 6 (six) hours as needed for pain. 120 tablet 0   ropinirole (REQUIP) 5 MG tablet Take 5 mg by mouth in the morning, at noon, and at bedtime.     torsemide (DEMADEX) 20 MG tablet Take 1 tablet (20 mg total) by mouth daily. 90 tablet 3   vitamin B-12 (CYANOCOBALAMIN) 100 MCG tablet Take 100 mcg by mouth daily.     No current facility-administered medications for this visit.    Allergies:   Brilinta [ticagrelor], Colchicine, Metoprolol tartrate, Mirapex [pramipexole dihydrochloride], Shellfish allergy, Statins, Zolpidem, Coconut oil, Lasix [furosemide], Levaquin [levofloxacin], and Trazodone and nefazodone   Social History:  The patient  reports that he quit smoking about 19 years ago. His smoking use included cigarettes. He has a 100.00 pack-year smoking history. He has been exposed to tobacco smoke. He has never used smokeless tobacco. He reports that he does not drink alcohol and does not use drugs.   Family History:  The patient's family history includes Alcoholism in his maternal grandfather and paternal uncle; Heart attack in his father; Heart disease in his father; Leukemia in  his mother; Stroke in his father.  ROS:  Please see the history of present illness.    All other systems are reviewed and otherwise negative.   PHYSICAL EXAM:  VS:  BP 130/62    Pulse (!) 57    Ht 5\' 9"  (1.753 m)    Wt 228 lb (103.4 kg)    SpO2 97%    BMI 33.67 kg/m  BMI: Body mass index is 33.67 kg/m. Well nourished, well developed, in no acute distress HEENT: normocephalic, atraumatic Neck: no JVD, carotid bruits or masses Cardiac:  RRR; no significant murmurs, no rubs, or gallops Lungs:  very slight crackles at the bases, CTA b/l otherwise no wheezing, rhonchi or rales Abd: soft, nontender MS: no deformity or atrophy Ext: trace-1+ edema Skin: warm and dry, no rash Neuro:  No gross deficits appreciated Psych: euthymic mood, full affect  PPM site is stable, no tethering or discomfort   EKG:  not done today  Device interrogation done today and reviewed by myself:  Battery and lead measurements are good + AMS episodes,, generally brief but over the year(s) a slight trend up Burden 0.1% (since 2018)   Echo 01/08/17: Left ventricle: The cavity size was normal. Wall thickness was   increased in a pattern of mild LVH. Systolic function was normal.   The estimated ejection fraction was in the range of 60% to 65%.   Wall motion was normal; there were no regional wall motion   abnormalities. Doppler parameters are consistent with abnormal   left ventricular relaxation (grade 1 diastolic dysfunction). - Aortic valve: Transvalvular velocity was within the normal range.   There was no stenosis. There was no regurgitation. - Mitral valve: Mildly calcified annulus. Transvalvular velocity   was within the normal range. There was no evidence for stenosis.   There was no regurgitation. - Left atrium: The atrium was mildly dilated. - Right ventricle: The cavity size was normal. Wall thickness was   normal. Systolic function was normal. - Atrial septum: No defect or patent foramen ovale  was identified. - Tricuspid valve: There was trivial regurgitation.   Cardiac cath 11/26/14:     Left Anterior Descending     Ost LAD to Prox LAD lesion, 50% stenosed. discrete . The lesion was not previously treated.  Prox LAD to Mid LAD lesion, 0% stenosed. Previously placed Prox LAD to Mid LAD stent (unknown type) is patent.     Dist LAD lesion, 25% stenosed.            Left Circumflex     Mid Cx lesion, 30% stenosed.           Right Coronary Artery     Prox RCA lesion, 20% stenosed.     Mid RCA lesion, 40% stenosed.     Mid RCA to Dist RCA lesion, 30% stenosed.     Right Posterior Descending Artery     RPDA lesion, 0% stenosed. Previously placed RPDA drug eluting stent is patent.      Recent Labs: 12/20/2020: ALT 16; B Natriuretic Peptide 33.2 03/30/2021: TSH 0.12 06/26/2021: BUN 13; Creatinine, Ser 1.11; Hemoglobin 14.4; Platelets 245; Potassium 4.0; Sodium 135  No results found for requested labs within last 8760 hours.   CrCl cannot be calculated (Patient's most recent lab result is older than the maximum 21 days allowed.).   Wt Readings from Last 3 Encounters:  07/20/21 228 lb (103.4 kg)  06/29/21 217 lb (98.4 kg)  06/28/21 231 lb 3.2 oz (104.9 kg)     Other studies reviewed: Additional studies/records reviewed today include: summarized above  ASSESSMENT AND PLAN:  PPM Intact function, no programming changes made  Paroxysmal Afib, AFlutter CHA2DS2Vasc is 4, on Eliquis, appropriately dosed 0.1 % burden Though in the last 66mo-28yr slight up tick  He is reluctant to consider BB again, says that it stopped his heart before Hesitant to increase his diltiazem Follow   CAD Infrequent chest pressure, he reports not new not escalating Intolerance to many medicines C/w Dr. [name]  HTN Looks ok  HLD Statin intolerant Self stopped zetia Declined lipid clinic referral  6. Volume OL He reports since starting the torsemide he has lost several pounds (by our  scale 3) and his edema is better Describes DOE and some occasional orthopnea/PND Labs today Update his echo Will adjust his torsemide once lab is back Needs cardiology follow up for volume/follow up  7. Falls? Sound infrequent and not clearly syncope Has had some RARE NSVT though none >2seconds and none since Aug    Disposition: F/u with remotes as usual, in clinic in a year for pacer check, gen gardioloy foklow up in a month or 2, sooner if needed.  Current medicines are reviewed at length with the patient today.  The patient did not have any concerns regarding medicines.  Venetia Night, PA-C 07/20/2021 11:31 AM     [address] [place] [id number] [phone] (office)  [phone] (fax)

## 2021-07-20 ENCOUNTER — Other Ambulatory Visit: Payer: Self-pay

## 2021-07-20 ENCOUNTER — Encounter: Payer: Self-pay | Admitting: Physician Assistant

## 2021-07-20 ENCOUNTER — Ambulatory Visit: Payer: Medicare Other | Admitting: Physician Assistant

## 2021-07-20 VITALS — BP 130/62 | HR 57 | Ht 69.0 in | Wt 228.0 lb

## 2021-07-20 DIAGNOSIS — I480 Paroxysmal atrial fibrillation: Secondary | ICD-10-CM | POA: Diagnosis not present

## 2021-07-20 DIAGNOSIS — R0602 Shortness of breath: Secondary | ICD-10-CM | POA: Diagnosis not present

## 2021-07-20 DIAGNOSIS — I4891 Unspecified atrial fibrillation: Secondary | ICD-10-CM | POA: Diagnosis not present

## 2021-07-20 DIAGNOSIS — Z950 Presence of cardiac pacemaker: Secondary | ICD-10-CM

## 2021-07-20 DIAGNOSIS — I5032 Chronic diastolic (congestive) heart failure: Secondary | ICD-10-CM | POA: Diagnosis not present

## 2021-07-20 LAB — CUP PACEART INCLINIC DEVICE CHECK
Battery Remaining Longevity: 117 mo
Battery Voltage: 2.99 V
Brady Statistic AP VP Percent: 5.01 %
Brady Statistic AP VS Percent: 17.26 %
Brady Statistic AS VP Percent: 8.49 %
Brady Statistic AS VS Percent: 69.25 %
Brady Statistic RA Percent Paced: 23 %
Brady Statistic RV Percent Paced: 13.52 %
Date Time Interrogation Session: 20221229125149
Implantable Lead Implant Date: 20180604
Implantable Lead Implant Date: 20180604
Implantable Lead Location: 753859
Implantable Lead Location: 753860
Implantable Lead Model: 5076
Implantable Lead Model: 5076
Implantable Pulse Generator Implant Date: 20180604
Lead Channel Impedance Value: 323 Ohm
Lead Channel Impedance Value: 380 Ohm
Lead Channel Impedance Value: 437 Ohm
Lead Channel Impedance Value: 475 Ohm
Lead Channel Pacing Threshold Amplitude: 0.625 V
Lead Channel Pacing Threshold Amplitude: 1 V
Lead Channel Pacing Threshold Pulse Width: 0.4 ms
Lead Channel Pacing Threshold Pulse Width: 0.4 ms
Lead Channel Sensing Intrinsic Amplitude: 1.625 mV
Lead Channel Sensing Intrinsic Amplitude: 5.125 mV
Lead Channel Sensing Intrinsic Amplitude: 6 mV
Lead Channel Sensing Intrinsic Amplitude: 6.25 mV
Lead Channel Setting Pacing Amplitude: 1.5 V
Lead Channel Setting Pacing Amplitude: 2.5 V
Lead Channel Setting Pacing Pulse Width: 0.4 ms
Lead Channel Setting Sensing Sensitivity: 1.2 mV

## 2021-07-20 LAB — CBC
Hematocrit: 42.4 % (ref 37.5–51.0)
Hemoglobin: 13.7 g/dL (ref 13.0–17.7)
MCH: 27.3 pg (ref 26.6–33.0)
MCHC: 32.3 g/dL (ref 31.5–35.7)
MCV: 85 fL (ref 79–97)
Platelets: 301 10*3/uL (ref 150–450)
RBC: 5.02 x10E6/uL (ref 4.14–5.80)
RDW: 16.2 % — ABNORMAL HIGH (ref 11.6–15.4)
WBC: 10.5 10*3/uL (ref 3.4–10.8)

## 2021-07-20 LAB — BASIC METABOLIC PANEL
BUN/Creatinine Ratio: 12 (ref 10–24)
BUN: 14 mg/dL (ref 8–27)
CO2: 25 mmol/L (ref 20–29)
Calcium: 9.7 mg/dL (ref 8.6–10.2)
Chloride: 101 mmol/L (ref 96–106)
Creatinine, Ser: 1.17 mg/dL (ref 0.76–1.27)
Glucose: 112 mg/dL — ABNORMAL HIGH (ref 70–99)
Potassium: 4.4 mmol/L (ref 3.5–5.2)
Sodium: 140 mmol/L (ref 134–144)
eGFR: 63 mL/min/{1.73_m2} (ref >59)

## 2021-07-20 NOTE — Patient Instructions (Addendum)
Medication Instructions:   Your physician recommends that you continue on your current medications as directed. Please refer to the Current Medication list given to you today.  *If you need a refill on your cardiac medications before your next appointment, please call your pharmacy*   Lab Work:  BMET AND CBC TODAY   If you have labs (blood work) drawn today and your tests are completely normal, you will receive your results only by: [place] (if you have MyChart) OR A paper copy in the mail If you have any lab test that is abnormal or we need to change your treatment, we will call you to review the results.   Testing/Procedures: Your physician has requested that you have an echocardiogram. Echocardiography is a painless test that uses sound waves to create images of your heart. It provides your doctor with information about the size and shape of your heart and how well your hearts chambers and valves are working. This procedure takes approximately one hour. There are no restrictions for this procedure.   Follow-Up: At [facility], you and your health needs are our priority.  As part of our continuing mission to provide you with exceptional heart care, we have created designated Provider Care Teams.  These Care Teams include your primary Cardiologist (physician) and Advanced Practice Providers (APPs -  Physician Assistants and Nurse Practitioners) who all work together to provide you with the care you need, when you need it.  We recommend signing up for the patient portal called "MyChart".  Sign up information is provided on this After Visit Summary.  MyChart is used to connect with patients for Virtual Visits (Telemedicine).  Patients are able to view lab/test results, encounter notes, upcoming appointments, etc.  Non-urgent messages can be sent to your provider as well.   To learn more about what you can do with MyChart, go to [email or web address].    Your next appointment:    C.[place]  6-8 WEEKS   1 year(s)  The format for your next appointment:   In Person  Provider:   You may see Virl Axe, MD or one of the following Advanced Practice Providers on your designated Care Team:   Tommye Standard, [place] Legrand Como "Oda Kilts, PA-C{ 1  Other Instructions

## 2021-07-21 ENCOUNTER — Other Ambulatory Visit: Payer: Self-pay | Admitting: *Deleted

## 2021-07-21 ENCOUNTER — Ambulatory Visit: Payer: Medicare Other

## 2021-07-21 ENCOUNTER — Telehealth: Payer: Self-pay | Admitting: Cardiovascular Disease

## 2021-07-21 DIAGNOSIS — Z79899 Other long term (current) drug therapy: Secondary | ICD-10-CM

## 2021-07-21 MED ORDER — TORSEMIDE 20 MG PO TABS: 20.0000 mg | ORAL_TABLET | Freq: Two times a day (BID) | ORAL | 1 refills | Status: DC

## 2021-07-21 MED ORDER — POTASSIUM CHLORIDE CRYS ER 10 MEQ PO TBCR: 10.0000 meq | EXTENDED_RELEASE_TABLET | Freq: Every day | ORAL | 1 refills | Status: DC

## 2021-07-21 NOTE — Telephone Encounter (Signed)
Orders placed today by Tommye Standard, will forward to her for follow- up. Judson Roch, RN

## 2021-07-21 NOTE — Telephone Encounter (Signed)
Patient states he is returning a call from today. 

## 2021-07-30 ENCOUNTER — Telehealth: Payer: Self-pay | Admitting: Student in an Organized Health Care Education/Training Program

## 2021-07-30 DIAGNOSIS — I5041 Acute combined systolic (congestive) and diastolic (congestive) heart failure: Secondary | ICD-10-CM

## 2021-07-30 MED ORDER — POTASSIUM CHLORIDE CRYS ER 20 MEQ PO TBCR: 20.0000 meq | EXTENDED_RELEASE_TABLET | Freq: Every day | ORAL | 3 refills | Status: DC

## 2021-07-30 NOTE — Telephone Encounter (Signed)
Paged by operator that patient was having severe leg cramps. Dr. [name] patient recently seen in [place] cardiology (07/20/21) after recently being seen (06/28/21) by Dr. [name]. No rest SOB at recent visit. On torsemide 20 mg daily. Lbs on 07/20/21 with K (4.4) and torsemide was increased to 20 mg bid along with addition of K 10 mEq daily.   Patient reports severe "restless legs" over the past 2 days. Takes torsemide 20 mg bid over the past 3 days. Michela Pitcher he is supposed to be taking "something for Parkinson's disease and HF." Confirmed torsemide 20 mg bid via review of medications. Spiro 25 mg daily (currently taking). Has appointment with Dr. Rosendo Gros (surgery in the morning). Does not have K accessible or in his medication pack. He does not want to be evaluated in the emergency department. Asked him to stop taking torsemide, sent in for refill K and increased to 20 mEq daily with refills. He will call office in morning and plans to come by to get Mg/K checked. Then we can decide what dose of torsemide/K is appropriate. BMP/Mg ordered.

## 2021-07-31 ENCOUNTER — Other Ambulatory Visit: Payer: Medicare Other | Admitting: *Deleted

## 2021-07-31 ENCOUNTER — Other Ambulatory Visit: Payer: Self-pay

## 2021-07-31 DIAGNOSIS — I5041 Acute combined systolic (congestive) and diastolic (congestive) heart failure: Secondary | ICD-10-CM

## 2021-07-31 LAB — MAGNESIUM: Magnesium: 1.8 mg/dL (ref 1.7–2.4)

## 2021-07-31 LAB — BASIC METABOLIC PANEL
Anion gap: 10 (ref 5–15)
BUN: 14 mg/dL (ref 8–23)
CO2: 28 mmol/L (ref 22–32)
Calcium: 8.8 mg/dL — ABNORMAL LOW (ref 8.9–10.3)
Chloride: 99 mmol/L (ref 98–111)
Creatinine, Ser: 1.31 mg/dL — ABNORMAL HIGH (ref 0.61–1.24)
GFR, Estimated: 55 mL/min — ABNORMAL LOW (ref >60)
Glucose, Bld: 166 mg/dL — ABNORMAL HIGH (ref 70–99)
Potassium: 4 mmol/L (ref 3.5–5.1)
Sodium: 137 mmol/L (ref 135–145)

## 2021-08-02 ENCOUNTER — Encounter (HOSPITAL_BASED_OUTPATIENT_CLINIC_OR_DEPARTMENT_OTHER): Payer: Self-pay | Admitting: Emergency Medicine

## 2021-08-02 ENCOUNTER — Emergency Department (HOSPITAL_BASED_OUTPATIENT_CLINIC_OR_DEPARTMENT_OTHER)
Admission: EM | Admit: 2021-08-02 | Discharge: 2021-08-02 | Disposition: A | Discharge status: Home / Self Care | Payer: Medicare Other | Attending: Emergency Medicine | Admitting: Emergency Medicine

## 2021-08-02 ENCOUNTER — Other Ambulatory Visit: Payer: Self-pay

## 2021-08-02 ENCOUNTER — Emergency Department (HOSPITAL_BASED_OUTPATIENT_CLINIC_OR_DEPARTMENT_OTHER): Payer: Medicare Other

## 2021-08-02 ENCOUNTER — Telehealth: Payer: Self-pay | Admitting: Cardiovascular Disease

## 2021-08-02 DIAGNOSIS — Z7951 Long term (current) use of inhaled steroids: Secondary | ICD-10-CM | POA: Diagnosis not present

## 2021-08-02 DIAGNOSIS — I110 Hypertensive heart disease with heart failure: Secondary | ICD-10-CM | POA: Insufficient documentation

## 2021-08-02 DIAGNOSIS — I252 Old myocardial infarction: Secondary | ICD-10-CM | POA: Diagnosis not present

## 2021-08-02 DIAGNOSIS — Z7901 Long term (current) use of anticoagulants: Secondary | ICD-10-CM | POA: Insufficient documentation

## 2021-08-02 DIAGNOSIS — R3911 Hesitancy of micturition: Secondary | ICD-10-CM | POA: Insufficient documentation

## 2021-08-02 DIAGNOSIS — Z79899 Other long term (current) drug therapy: Secondary | ICD-10-CM | POA: Diagnosis not present

## 2021-08-02 DIAGNOSIS — I509 Heart failure, unspecified: Secondary | ICD-10-CM | POA: Diagnosis not present

## 2021-08-02 DIAGNOSIS — R339 Retention of urine, unspecified: Secondary | ICD-10-CM | POA: Diagnosis present

## 2021-08-02 DIAGNOSIS — R109 Unspecified abdominal pain: Secondary | ICD-10-CM | POA: Insufficient documentation

## 2021-08-02 DIAGNOSIS — I2510 Atherosclerotic heart disease of native coronary artery without angina pectoris: Secondary | ICD-10-CM | POA: Diagnosis not present

## 2021-08-02 DIAGNOSIS — I700 Atherosclerosis of aorta: Secondary | ICD-10-CM | POA: Diagnosis not present

## 2021-08-02 LAB — URINALYSIS, ROUTINE W REFLEX MICROSCOPIC
Bilirubin Urine: NEGATIVE
Glucose, UA: NEGATIVE mg/dL
Hgb urine dipstick: NEGATIVE
Ketones, ur: NEGATIVE mg/dL
Leukocytes,Ua: NEGATIVE
Nitrite: NEGATIVE
Protein, ur: NEGATIVE mg/dL
Specific Gravity, Urine: 1.019 (ref 1.005–1.030)
pH: 6.5 (ref 5.0–8.0)

## 2021-08-02 LAB — CBC
HCT: 39.8 % (ref 39.0–52.0)
Hemoglobin: 12.5 g/dL — ABNORMAL LOW (ref 13.0–17.0)
MCH: 27.5 pg (ref 26.0–34.0)
MCHC: 31.4 g/dL (ref 30.0–36.0)
MCV: 87.5 fL (ref 80.0–100.0)
Platelets: 234 10*3/uL (ref 150–400)
RBC: 4.55 MIL/uL (ref 4.22–5.81)
RDW: 16.1 % — ABNORMAL HIGH (ref 11.5–15.5)
WBC: 9.6 10*3/uL (ref 4.0–10.5)
nRBC: 0 % (ref 0.0–0.2)

## 2021-08-02 LAB — BASIC METABOLIC PANEL
Anion gap: 8 (ref 5–15)
BUN: 12 mg/dL (ref 8–23)
CO2: 27 mmol/L (ref 22–32)
Calcium: 9 mg/dL (ref 8.9–10.3)
Chloride: 105 mmol/L (ref 98–111)
Creatinine, Ser: 0.97 mg/dL (ref 0.61–1.24)
GFR, Estimated: >60 mL/min (ref >60)
Glucose, Bld: 138 mg/dL — ABNORMAL HIGH (ref 70–99)
Potassium: 4 mmol/L (ref 3.5–5.1)
Sodium: 140 mmol/L (ref 135–145)

## 2021-08-02 MED ORDER — SODIUM CHLORIDE 0.9 % IV BOLUS
1000.0000 mL | Freq: Once | INTRAVENOUS | Status: AC
  Administered 2021-08-02: 1000 mL via INTRAVENOUS

## 2021-08-02 MED ORDER — IOHEXOL 300 MG/ML  SOLN: 100.0000 mL | Freq: Once | INTRAMUSCULAR | Status: DC | PRN

## 2021-08-02 NOTE — Telephone Encounter (Signed)
Pt c/o medication issue:  1. Name of Medication: potassium chloride SA (KLOR-CON M) 20 MEQ tablet  2. How are you currently taking this medication (dosage and times per day)? PT IS PARANOID AND HE HAS A GUN  3. Are you having a reaction (difficulty breathing--STAT)? PT IS UNABLE TO URINATE  4. What is your medication issue? PT SAID HE HAS BEEN PARANOID SINCE STARTING THIS MEDICINE. PT IS DEPRESSED AND IS SITTING IN HIS RECLINER WITH A PISTOL IN HIS LAB. PT HAVE NOT GOTTEN MUCH SLEEP SINCE TAKING THIS MEDICINE AND HE FEELS LIKE SOMEONE IS GOING TO BREAK INTO HIS HOME.

## 2021-08-02 NOTE — ED Notes (Signed)
Patient bladder scanned. 73ml's noted after multiple attempts.

## 2021-08-02 NOTE — ED Notes (Signed)
Pts iv infiltrated in CT. Pt refuses another IV stick. PA aware

## 2021-08-02 NOTE — Telephone Encounter (Signed)
Attempted to call PCP with no answer. Called Dr Rosendo Gros' office (surgeon) instead. Discussed with his nurse, after being transferred, the situation with the pt having new onset mental confusion associated with hallucinations and depression. Explained that the patient believes it to be attributable to his potassium RX, but that he has verbalized since his post-op appt his urine volume has significantly decreased. Pt states that immediately following procedure he was able to urinate with no difficulty and large production-especially with furosemide on board. Over last 2 days pt states that his urine has become "very bright yellow" and states that over a full 24 hour period, he can only produce "about a cup to a cup and a half." Nurse at the surgeon's office also states that she believes his new symptoms are likely due to urine retention and likely infection, but states that she can place an order for a UA to be done, however they don't do them in the office and she agrees that due to pt's mental status, he needs more immediate treatment. She advised we contact the son listed on the [facility]. Returned call to pt to get alternate number since son didn't pick up the call, pt didn't have another number and states that the son lives in [place]. I spoke with the patient at length discussing that he likely is experiencing these symptoms due to urinary retention and possible infection and that he needs to go into an emergency room so that he can quickly get a urinalysis done and determine the cause of the retention. (Chart shows he had a second hernia -inguinal that descended into his scrotum that may be contributing to his retention). Pt agrees that something needs to be done soon so that he can urinate and help clear his mental status and agrees to go to our Emergency Dept at [facility]. Pt refused to go to ER at hospital due to past bad experience. Instructed pt to call back if symptoms worsen or if he needs [place] for any  reason. Judson Roch, RN

## 2021-08-02 NOTE — Telephone Encounter (Signed)
Pt called urgently transferred to me.. I spoke with the pt and he reports that he had his K supp altered 07/31/21 by the on call MD when he called in for restless leg syndrome... he says that ever since he has been very paranoid, sleeping with his pistol next too him at night, just does not feel like himself.. he has been retaining urine... he has not been able to urinate for 2 days... no pressure, pain, bleeding... but he has been drinking plenty of water.. he had umbilical hernia surgery last month but noted a scrotal mass that he still has but they have placed a mesh.... he denies fever... he saw the surgeon a few days ago but this has started after his visit.. he is "stable" for now, he says he does not feel paranoid at this time, he has a concealed weapon legally and does not plan to ever use it he just felt "off" and scared at night so he kept it nearby.   I advised the pt that I will call his PCP for him and talk with Dr. [name] to come up with a plan to help him and he agrees.

## 2021-08-02 NOTE — ED Triage Notes (Signed)
Pt via pov from home with urinary retention since yesterday. Pt reports that he had hernia surgery on 39/1 and had no complications. Pt prescribed klor-con by his cardiologist and was started 2 days ago. Pt alert & oriented, nad noted.

## 2021-08-02 NOTE — ED Notes (Signed)
Pt refused I/O cath despite RN explaining importance of accurate PVR.

## 2021-08-02 NOTE — ED Notes (Signed)
Pt voided 75 cc. PVR via scanner.0

## 2021-08-02 NOTE — ED Provider Notes (Signed)
[facility] EMERGENCY DEPT Provider Note   CSN: [id number] Arrival date & time: 08/02/21  1426     History  Chief Complaint  Patient presents with   Urinary Retention    [name] is a 81 y.o. male.  HPI   Pt is a 81 y/o male with a h/o A flutter, bradycardia, CAD, CHF, depression, diverticulosis, GERD, hiatal hernia, hyperlipidemia, hypertension, MI, OSA, prediabetes, PACs, PVCs, who presents to the ED today for eval of urinary retention that started over the last 24 hours. He does not know when the last time he was able to fully empty his bladder. He is able to have a small amount of output ("a few tablespoons) but states he is not fully emptying his bladder. He denies any abd pain, NV, fevers. Denies recent dysuria. He is unsure if he has had hematuria. Denies frequency. Denies known hx of prostate issues.   Home Medications Prior to Admission medications   Medication Sig Start Date End Date Taking? Authorizing Provider  apixaban (ELIQUIS) 5 MG TABS tablet Take 1 tablet (5 mg total) by mouth 2 (two) times daily. 09/13/20   Burnell Blanks, MD  Ascorbic Acid (VITAMIN C PO) Take 4 tablets by mouth daily as needed (flu like symptopms).     [provider]  clotrimazole (LOTRIMIN) 1 % cream Apply 1 application topically daily.    [provider]  diclofenac sodium (VOLTAREN) 1 % GEL Apply 1 application topically daily as needed (ankle pain). 11/27/14   Barrett, Evelene Croon, PA-C  diltiazem (CARDIZEM CD) 120 MG 24 hr capsule TAKE 1 CAPSULE BY MOUTH EVERY DAY 02/20/21   Burnell Blanks, MD  fluticasone (FLONASE) 50 MCG/ACT nasal spray Place 1 spray into both nostrils daily as needed for allergies or rhinitis. 01/04/21   [name], MD  gabapentin (NEURONTIN) 100 MG capsule TAKE 1 CAPSULE BY MOUTH THREE TIMES A DAY 11/16/20   [name], MD  levothyroxine (SYNTHROID) 200 MCG tablet Take 1 tablet (200 mcg total) by mouth daily. 12/28/20    [name], MD  magnesium oxide (MAG-OX) 400 MG tablet Take 400 mg by mouth daily as needed (restless leg).    [provider]  nitroGLYCERIN (NITROSTAT) 0.4 MG SL tablet Place 1 tablet (0.4 mg total) under the tongue every 5 (five) minutes as needed for chest pain. 05/24/20   [name], MD  omeprazole (PRILOSEC) 40 MG capsule Take 1 capsule (40 mg total) by mouth daily. 05/09/20   [name], MD  oxycodone (ROXICODONE) 30 MG immediate release tablet Take 1 tablet (30 mg total) by mouth every 6 (six) hours as needed for pain. 08/05/21 09/04/21  [name], MD  potassium chloride SA (KLOR-CON M) 20 MEQ tablet Take 1 tablet (20 mEq total) by mouth daily. 07/30/21   Dion Body, MD  ropinirole (REQUIP) 5 MG tablet Take 5 mg by mouth in the morning, at noon, and at bedtime.    [provider]  torsemide (DEMADEX) 20 MG tablet Take 1 tablet (20 mg total) by mouth 2 (two) times daily. 07/21/21   Baldwin Jamaica, PA-C  vitamin B-12 (CYANOCOBALAMIN) 100 MCG tablet Take 100 mcg by mouth daily.    [provider]      Allergies    Brilinta [ticagrelor], Colchicine, Metoprolol tartrate, Mirapex [pramipexole dihydrochloride], Shellfish allergy, Statins, Zolpidem, Coconut oil, Lasix [furosemide], Levaquin [levofloxacin], and Trazodone and nefazodone    Review of Systems  Review of Systems  Constitutional:  Negative for chills and fever.  HENT:  Negative for ear pain and sore throat.   Eyes:  Negative for visual disturbance.  Respiratory:  Negative for cough and shortness of breath.   Cardiovascular:  Negative for chest pain.  Gastrointestinal:  Negative for abdominal pain, constipation, diarrhea, nausea and vomiting.  Genitourinary:  Positive for decreased urine volume and difficulty urinating. Negative for dysuria and hematuria.  Musculoskeletal:  Negative for back pain.  Skin:  Negative for rash.  Neurological:  Negative for headaches.  All other  systems reviewed and are negative.  Physical Exam Updated Vital Signs BP 136/80    Pulse 70    Temp 97.9 F (36.6 C) (Oral)    Resp 20    Ht 5\' 9"  (1.753 m)    Wt 98.4 kg    SpO2 100%    BMI 32.05 kg/m  Physical Exam Vitals and nursing note reviewed.  Constitutional:      General: He is not in acute distress.    Appearance: He is well-developed.  HENT:     Head: Normocephalic and atraumatic.  Eyes:     Conjunctiva/sclera: Conjunctivae normal.  Cardiovascular:     Rate and Rhythm: Normal rate and regular rhythm.     Heart sounds: No murmur heard. Pulmonary:     Effort: Pulmonary effort is normal. No respiratory distress.     Breath sounds: Normal breath sounds.  Abdominal:     General: Bowel sounds are normal.     Palpations: Abdomen is soft.     Tenderness: There is no abdominal tenderness. There is no guarding or rebound.  Musculoskeletal:        General: No swelling.     Cervical back: Neck supple.  Skin:    General: Skin is warm and dry.     Capillary Refill: Capillary refill takes less than 2 seconds.  Neurological:     Mental Status: He is alert.  Psychiatric:        Mood and Affect: Mood normal.    ED Results / Procedures / Treatments   Labs (all labs ordered are listed, but only abnormal results are displayed) Labs Reviewed  BASIC METABOLIC PANEL - Abnormal; Notable for the following components:      Result Value   Glucose, Bld 138 (*)    All other components within normal limits  CBC - Abnormal; Notable for the following components:   Hemoglobin 12.5 (*)    RDW 16.1 (*)    All other components within normal limits  URINALYSIS, ROUTINE W REFLEX MICROSCOPIC    EKG None  Radiology CT ABDOMEN PELVIS WO CONTRAST  Result Date: 08/02/2021 CLINICAL DATA:  Postoperative abdominal pain. Urinary retention. Hernia surgery on 06/29/2021. EXAM: CT ABDOMEN AND PELVIS WITHOUT CONTRAST TECHNIQUE: Multidetector CT imaging of the abdomen and pelvis was performed  following the standard protocol without IV contrast. RADIATION DOSE REDUCTION: This exam was performed according to the departmental dose-optimization program which includes automated exposure control, adjustment of the mA and/or kV according to patient size and/or use of iterative reconstruction technique. COMPARISON:  CT abdomen and pelvis 02/20/2020. CT abdomen and pelvis 822 1,022. FINDINGS: Lower chest: Pacemaker wires are partially visualized. Lung bases are grossly clear. Hepatobiliary: No focal liver abnormality is seen. Status post cholecystectomy. No biliary dilatation. Pancreas: Unremarkable. No pancreatic ductal dilatation or surrounding inflammatory changes. Spleen: Normal in size without focal abnormality. Adrenals/Urinary Tract: Adrenal glands are unremarkable. Kidneys are normal, without renal  calculi, focal lesion, or hydronephrosis. Bladder is unremarkable. Stomach/Bowel: Stomach is within normal limits. No evidence of bowel wall thickening, distention, or inflammatory changes. There is sigmoid colon diverticulosis without evidence for acute diverticulitis. The appendix is not visualized. Vascular/Lymphatic: Aortic atherosclerosis. No enlarged abdominal or pelvic lymph nodes. Reproductive: Prostate gland is enlarged measuring 5.1 x 6 3 cm similar to the prior study. Other: There is no ascites or free air. There has been interval left inguinal hernia repair. There is mild stranding and surgical clips at the origin of the inguinal canal. No fluid collection identified. No evidence for recurrent hernia. Small fat containing umbilical hernia has decreased in size. Musculoskeletal: No acute or significant osseous findings. IMPRESSION: 1. Status post left inguinal hernia repair. No evidence for fluid collection or hematoma at the surgical site. 2. Small fat containing umbilical hernia has decreased in size. 3. Stable prostatomegaly. 4. Colonic diverticulosis. 5.  Aortic Atherosclerosis (ICD10-I70.0).  Electronically Signed   By: Ronney Asters M.D.   On: 08/02/2021 18:36    Procedures Procedures     Medications Ordered in ED Medications  iohexol (OMNIPAQUE) 300 MG/ML solution 100 mL (has no administration in time range)  sodium chloride 0.9 % bolus 1,000 mL (1,000 mLs Intravenous New Bag/Given 08/02/21 1723)    ED Course/ Medical Decision Making/ A&P                           Medical Decision Making  81 year old male presents the emergency department today with concerns of urinary retention.  States he is having some decreased urine output.  He does deny any specific abdominal pain nausea vomiting or fevers.  He did recently have hernia repair and states that his physician was concerned this may be related to postop complication so sent here for further evaluation.  Reviewed/interpreted labs CBC and metabolic panel are reassuring.  His urinalysis does not show any evidence of urinary retention.  He was bladder scanned several times and had 0 mL in the bladder.  I did offer to do a straight cath for residual urine however the patient declined.  He is able to urinate several times in the ED and specifically was able to urinate after given IV fluids here as well.  A CT scan was obtained of the abdomen to rule out any postop complication.  Unfortunately the patient had several IVs that failed and was unable to receive IV contrast.  He refused additional IV placement so therefore this was completed without IV contrast.  There was no postop complication observed after review of the imaging.  I do feel that he is appropriate for discharge home.  I will send him to follow-up with urology given his concerns for urinary retention.  Additionally, prior records and there were several notes from nursing staff from him and patient calling in about his symptoms.  He indicates that he was reporting he was feeling paranoid like someone was going to harm him so he was sleeping with guns next to him.  I had a  long discussion with the patient about this and he states he had just had a really bad day and was worried that someone is going to break into his house to harm him.  He states he always carries a gun with him so that is not abnormal for him.  He is no longer feeling paranoid and states he has no intention of harming himself or others at this time.  He  feels completely safe at home.  I do not suspect that he needs an emergent psychiatric evaluation at this time.  Patient agrees to return to the ED if any new or worsening concerns arise.  Final Clinical Impression(s) / ED Diagnoses Final diagnoses:  Urinary hesitancy    Rx / DC Orders ED Discharge Orders     None         Rodney Booze, PA-C 08/02/21 2152    Lorelle Gibbs, DO 08/03/21 1541

## 2021-08-02 NOTE — ED Notes (Signed)
Patient transported to CT 

## 2021-08-02 NOTE — Discharge Instructions (Addendum)
Your work-up here was reassuring.  You not had no evidence of urinary retention when your bladder was scanned, he had no evidence of urinary tract infection when we checked your urine.  Your CT scan was reassuring and we did not see any complications from your prior surgery.  I have referred you to a urologist due to your symptoms and you can also follow-up with your regular doctor in regards to your symptoms.  If you have new or worsening symptoms the meantime please return to the emergency department.

## 2021-08-07 ENCOUNTER — Ambulatory Visit (HOSPITAL_COMMUNITY): Payer: Medicare Other | Attending: Cardiology

## 2021-08-07 ENCOUNTER — Other Ambulatory Visit: Payer: Medicare Other

## 2021-08-07 ENCOUNTER — Other Ambulatory Visit: Payer: Self-pay

## 2021-08-07 ENCOUNTER — Ambulatory Visit (INDEPENDENT_AMBULATORY_CARE_PROVIDER_SITE_OTHER): Payer: Medicare Other | Admitting: Family Medicine

## 2021-08-07 ENCOUNTER — Encounter: Payer: Self-pay | Admitting: Family Medicine

## 2021-08-07 ENCOUNTER — Telehealth: Payer: Self-pay | Admitting: *Deleted

## 2021-08-07 ENCOUNTER — Telehealth: Payer: Self-pay | Admitting: Cardiovascular Disease

## 2021-08-07 VITALS — BP 110/60 | HR 60 | Temp 98.0°F | Wt 213.0 lb

## 2021-08-07 DIAGNOSIS — I5032 Chronic diastolic (congestive) heart failure: Secondary | ICD-10-CM | POA: Diagnosis not present

## 2021-08-07 DIAGNOSIS — R0602 Shortness of breath: Secondary | ICD-10-CM | POA: Diagnosis not present

## 2021-08-07 DIAGNOSIS — B029 Zoster without complications: Secondary | ICD-10-CM

## 2021-08-07 LAB — ECHOCARDIOGRAM COMPLETE
Area-P 1/2: 2.5 cm2
S' Lateral: 3.4 cm

## 2021-08-07 MED ORDER — KETOCONAZOLE 2 % EX CREA: 1.0000 "application " | TOPICAL_CREAM | Freq: Two times a day (BID) | CUTANEOUS | 5 refills | Status: AC | PRN

## 2021-08-07 MED ORDER — METHYLPREDNISOLONE ACETATE 40 MG/ML IJ SUSP
40.0000 mg | Freq: Once | INTRAMUSCULAR | Status: AC
  Administered 2021-08-07: 40 mg via INTRAMUSCULAR

## 2021-08-07 MED ORDER — VALACYCLOVIR HCL 1 G PO TABS: 1000.0000 mg | ORAL_TABLET | Freq: Three times a day (TID) | ORAL | 0 refills | Status: AC

## 2021-08-07 MED ORDER — NITROGLYCERIN 0.4 MG SL SUBL: 0.4000 mg | SUBLINGUAL_TABLET | SUBLINGUAL | 5 refills | Status: DC | PRN

## 2021-08-07 MED ORDER — ROPINIROLE HCL 5 MG PO TABS
5.0000 mg | ORAL_TABLET | Freq: Three times a day (TID) | ORAL | 1 refills | Status: DC
Start: 2021-08-07 — End: 2022-01-10

## 2021-08-07 MED ORDER — METHYLPREDNISOLONE ACETATE 80 MG/ML IJ SUSP
80.0000 mg | Freq: Once | INTRAMUSCULAR | Status: AC
  Administered 2021-08-07: 80 mg via INTRAMUSCULAR

## 2021-08-07 NOTE — Telephone Encounter (Signed)
New message   Patient had echo this morning and stopped by the check out desk wanting to let the nurse know that he had to take a nitro tablet on friday, saturday and Sunday because of chest pain and occasional arm numbness.  Patient also stated that he had to go to the ER at [facility] on last Wednesday because he could not pee and did not know if that "set something off".  He was stuck several times trying to get blood. Please advise.

## 2021-08-07 NOTE — Telephone Encounter (Signed)
-----   Message from [place], [place] sent at 08/07/2021  1:25 PM EST ----- Heart strength still looks OK, like his last echo is a little stiff (doesn't relax as well) and likely contributes to his water retention.  Keep follow up as planned How is his SOB on the increased torsemide.?

## 2021-08-07 NOTE — Progress Notes (Signed)
° °  Subjective:    Patient ID: [name], male    DOB: 11-02-1940, 81 y.o.   MRN: [id number]  HPI Here for 3 days of itching and burning pain in the left upper chest and left upper back. Then 2 days ago a red rash appeared in these areas.    Review of Systems  Constitutional: Negative.   Respiratory: Negative.    Cardiovascular: Negative.   Skin:  Positive for rash.      Objective:   Physical Exam Constitutional:      General: He is not in acute distress.    Appearance: Normal appearance.  Cardiovascular:     Rate and Rhythm: Normal rate and regular rhythm.     Pulses: Normal pulses.     Heart sounds: Normal heart sounds.  Pulmonary:     Effort: Pulmonary effort is normal.     Breath sounds: Normal breath sounds.  Skin:    Comments: Scattered patches of red raised skin with vesicles on the left upper chest and left upper back   Neurological:     Mental Status: He is alert.          Assessment & Plan:  Shingles, treat with 10 days of Valtrex 1000 mg TID. He is also given as shot of DepoMedrol.  [name], MD

## 2021-08-07 NOTE — Telephone Encounter (Signed)
I spoke with the patient.  He is feeling okay right now.  He saw Dr. Sarajane Jews today who adv he has likely been experiencing angina.[name] KitchenMarland KitchenDr. Sarajane Jews also diagnosed him with shingles today.  His lesions are on his left chest, more under his arm.  He reports being quite uncomfortable during the echo today.   Pt aware I would pass along this information to Dr. [name] and if there are any recommendations we would call him back.  Adv to call if any changes in symptoms.  He has been contacted already about his echo that was ordered by EP.

## 2021-08-07 NOTE — Telephone Encounter (Signed)
Spoke with patient and patient aware of results and verbalize understanding.Patient still having shortness of breath with increased Torsemide dose. Patient was seen at [facility] Emergency  Department Note in [place]. Patient complained of the several sticks he had by lab tech and his veins was frail due to dehydration.

## 2021-08-07 NOTE — Addendum Note (Signed)
Addended by: Wyvonne Lenz on: 08/07/2021 03:28 PM   Modules accepted: Orders

## 2021-08-07 NOTE — Addendum Note (Signed)
Addended by: Wyvonne Lenz on: 08/07/2021 03:36 PM   Modules accepted: Orders

## 2021-08-08 ENCOUNTER — Telehealth: Payer: Self-pay | Admitting: Pharmacist

## 2021-08-08 NOTE — Chronic Care Management (AMB) (Signed)
° ° °  Chronic Care Management Pharmacy Assistant   Name: [name]  MRN: [id number] DOB: 12-04-1940  08/11/2021 APPOINTMENT REMINDER   [name] was reminded to have all medications, supplements and any blood glucose and blood pressure readings available for review with [name], Pharm. D, at his telephone visit on 08/11/2021 at 12:00.   Questions: Have you had any recent office visit or specialist visit outside of [place]? Patient denies any visits outside of [facility]  Are there any concerns you would like to discuss during your office visit? Patient denies any concerns at this time  Are you having any problems obtaining your medications? (Whether it pharmacy issues or cost) Patient denies any issues getting medications.  He is getting Eliquis through the [place]  If patient has any PAP medications ask if they are having any problems getting their PAP medication or refill? Patient denies any medications filled by PAP  Care Gaps: AWV - scheduled 08/24/2021 Last BP - 110/60 on 08/07/2020 Covid vaccine - never done Malb - never done Hep C screen - never done Shingrix - never done Pneumovax - overdue   Star Rating Drug: None  Any gaps in medications fill history?  [place] Pharmacist Assistant [phone]

## 2021-08-11 ENCOUNTER — Ambulatory Visit (INDEPENDENT_AMBULATORY_CARE_PROVIDER_SITE_OTHER): Payer: Medicare Other | Admitting: Pharmacist

## 2021-08-11 DIAGNOSIS — I10 Essential (primary) hypertension: Secondary | ICD-10-CM

## 2021-08-11 DIAGNOSIS — E785 Hyperlipidemia, unspecified: Secondary | ICD-10-CM
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
## 2021-08-14 NOTE — Patient Instructions (Addendum)
Hi Mcclellan,  It was great to catch up with you on the telephone! Don't forget to try a low dose of time released melatonin (2 or 3 mg) to see if this helps with sleep.  Please reach out to me if you have any questions or need anything!  Best, [place], PharmD, [facility]   Visit Information   Goals Addressed   None    Patient Care Plan: RNCM:Cardiovascular disease Management (HF,CAD,AF,HTN and HLD)  Completed 06/09/2021   Problem Identified: Lack of long term managment of Cardiovascular disease (HF,CAD,AF,HTN and HLD) Resolved 06/09/2021  Priority: High     Long-Range Goal: Effective long term Cardiovascular disease Management (HF,CAD,AF,HTN and HLD) Completed 06/09/2021  Start Date: 02/27/2021  Expected End Date: 07/23/2021  Recent Progress: On track  Priority: High  Note:   Resolving due to duplicate goal  Current Barriers:  Knowledge deficits related to basic Cardiovascular disease Management (HF,CAD,AF,HTN and HLD) pathophysiology and self care management Unable to independently Self manage Cardiovascular disease (HF,CAD,AF,HTN and HLD) Does not adhere to provider recommendations re:  Does not adhere to prescribed medication regimen Financial strain Pt states he sometimes does not take his fluid pill if he is going out.  States he had some chest pain that was a pulled muscle when he over did it.   States he has had some swelling in his legs from his gout.  States he gets short of breath with exertion but it has not increased.  States he is walking with his lady friend at [employer] for 30-60 minutes most days  States he has been trying to  eat more healthy as his friend cooks healthy.  States he usually weights every day and he weighted 207 yesterday.  States his B/P has been good with his last reading 128/70.   Nurse Case Manager Clinical Goal(s):  patient will weigh self daily and record patient will  verbalize understanding of Heart Failure Action Plan and when to call doctor patient will take all Heart Failure mediations as prescribed Interventions:  Collaboration with [name], MD regarding development and update of comprehensive plan of care as evidenced by provider attestation and co-signature Inter-disciplinary care team collaboration (see longitudinal plan of care) Reviewed basic overview and discussion of pathophysiology of Heart Failure Reinforced to follow a low sodium diet and to not add salt to his watermelon or cantaloupe  Reviewed Heart Failure Action Plan  Assessed for scales in home-has scales Reinforced importance of daily weight Reinforced role of diuretics in prevention of fluid overload Reinforced education to patient re: stroke prevention, s/s of heart attack and stroke, DASH diet, complications of uncontrolled blood pressure Advised patient, providing education and rationale, to monitor blood pressure 3 times a week and record, calling PCP for findings outside established parameters Reviewed to keep appointment with cardiology on 06/28/21 Pharmacy referral for issues with adherence and knowledge of medications -scheduled to see CCM PharmD 05/15/21  Reinforced to use his arm B/P monitor instead of the wrist monitor to get more accurate readings  Reviewed to take a log of his B/P readings and B/P monitor to his appointment with CCM PharmD Encouraged to continue walking but to pace activity  Self-Care Activities:  Takes Heart Failure Medications as prescribed Weighs daily and record (notifying MD of 3 lb weight gain over night or 5 lb in a week) Verbalizes understanding of and follows CHF Action Plan Adheres to low sodium diet  Patient Goals:  -  Take Heart Failure Medications as prescribed - Weigh daily and record (notify MD with 3 lb weight gain over night or 5 lb in a week) - Follow CHF Action Plan - Adhere to low sodium diet - develop a rescue plan - eat  more whole grains, fruits and vegetables, lean meats and healthy fats - follow rescue plan if symptoms flare-up - know when to call the doctor - track symptoms and what helps feel better or worse - dress right for the weather, hot or cold - avoid heavy exercise on very hot days - pace activity allowing for rest - call office if I gain more than 2 pounds in one day or 5 pounds in one week - keep legs up while sitting - track weight in diary - use salt in moderation - watch for swelling in feet, ankles and legs every day - weigh myself daily - check blood pressure 3 times per week - choose a place to take my blood pressure (home, clinic or office, retail store) - write blood pressure results in a log or diary Follow Up Plan: Telephone follow up appointment with care management team member scheduled for: 06/09/21 at 9 AM The patient has been provided with contact information for the care management team and has been advised to call with any health related questions or concerns.      Patient Care Plan: RNCM:Chronic Pain (Adult)  Completed 06/09/2021   Problem Identified: Chronic Pain Management (Chronic Pain) Resolved 06/09/2021     Long-Range Goal: Chronic Pain Managed Completed 06/09/2021  Start Date: 02/27/2021  Expected End Date: 07/23/2021  Recent Progress: On track  Priority: Medium  Note:   Resolving due to duplicate goal  Current Barriers:  Knowledge Deficits related to self-health management of acute or chronic pain lower back and restless leg syndrome Chronic Disease Management support and education needs related to chronic pain Knowledge Deficits related to self management of chronic lower back pain Chronic Disease Management support and education needs related to self management of chronic lower back pain Unable to independently self management of chronic lower back pain States he his chronic pain still gets worse as the day goes by.   States his restless leg syndrome is an  issues and is not sure if medication has helped.  States he takes his pain medication in the evening which helps some.  Clinical Goal(s):  patient will verbalize understanding of plan for pain management. , patient will meet with RN Care Manager to address self management of chronic lower back pain, patient will attend all scheduled medical appointments: labs CCM PharmD 05/15/21, Dr. [name] 06/28/21, patient will demonstrate use of different relaxation  skills and/or diversional activities to assist with pain reduction (distraction, imagery, relaxation, massage, acupressure, TENS, heat, and cold application., patient will report pain at a level less than 3 to 4 on a 10-10 rating scale., patient will use pharmacological and nonpharmacological pain relief strategies as prescribed. , and patient will verbalize acceptable level of pain relief and ability to engage in desired activities Interventions:  Collaboration with [name], MD regarding development and update of comprehensive plan of care as evidenced by provider attestation and co-signature Pain assessment performed Medications reviewed Discussed plans with patient for ongoing care management follow up and provided patient with direct contact information for care management team Evaluation of current treatment plan related to self management of chronic lower back pain and patient's adherence to plan as established by provider. Reinforced  education to patient  re: self management of chronic lower back pain Reviewed medications with patient and discussed adherence Reviewed scheduled/upcoming provider appointments including: CCM PharmD 05/15/21, Dr. [name] 06/28/21, Pharmacy referral for adherence and knowledge issues-to see CCM PharmD on 05/15/21 Reviewed to pace his activities  Reviewed foods to watch to help prevent gout Patient Goals/Self Care Activities:  Will self-administer medications as prescribed Will attend all scheduled provider  appointments Will call pharmacy for medication refills 7 days prior to needed refill date Patient will calls provider office for new concerns or questions Follow Up Plan: Telephone follow up appointment with care management team member scheduled for: 06/15/21 at 9 AM The patient has been provided with contact information for the care management team and has been advised to call with any health related questions or concerns.       Patient Care Plan: LCSW Plan of Care     Problem Identified: Track and Manage My Symptoms of Grief and Depression. Resolved 03/20/2021  Priority: High     Goal: Track and Manage My Symptoms of Grief and Depression. Completed 03/20/2021  Start Date: 03/20/2021  Expected End Date: 03/20/2021  This Visit's Progress: On track  Priority: High  Note:   Current Barriers:   Acute Mental Health needs related to CAD, HF, HTN, HLD, AFib, Chronic Low Back Pain, Depression with Anxiety and Grief. Needs Support, Education, and Care Coordination in order to meet unmet mental health needs. Clinical Goal(s):  Patient will work with LCSW to reduce and manage symptoms of Depression with Anxiety and Grief.   Patient will increase knowledge and/or ability of:        Coping Skills, Healthy Habits, Self-Management Skills, Stress Reduction, Home Safety and Utilizing [employer] and Resources.   Clinical Interventions:  Assessed patient's previous treatment, needs, coping skills, current treatment, support system and barriers to care. PHQ-2 and PHQ-9 Depression Screening Tool performed and results reviewed with patient. Other interventions included:       Solution-Focused Therapy Performed, Mindfulness Meditation Strategies, Relaxation Techniques and Deep Breathing Exercises Encouraged, Active Listening/       Reflection Utilized, Emotional Support Provided, Problem Solving [place], Psychoeducation /Health Education, Motivational        Interviewing,  Brief Cognitive Behavioral Therapy Initiated, Reviewed Mental Health Medications and Discussed Compliance, Quality of Sleep Assessed and       Sleep Hygiene Techniques Promoted, Support Group Participation Encouraged, Increase Level of Activity/Exercise, Verbalization of Feelings Encouraged,        Crisis Resource Education/Information Provided, Suicidal Ideation/Homicidal Ideation Assessed - None Present.   Patient interviewed and appropriate assessments performed. Provided mental health counseling with regards to Depression with Anxiety and Grief.   Discussed several options for long-term counseling based on need and insurance.  Collaboration with Primary Care Physician, Dr. [name] regarding development and update of comprehensive plan of care as evidenced by provider attestation and co-signature. Inter-disciplinary care team collaboration (see longitudinal plan of care). Patient Goals/Self-Care Activities: Consider self-enrollment in a grief and loss support group.  Incorporate into daily practice - relaxation techniques, deep breathing exercises and mindfulness meditation strategies. Contact LCSW directly (# [id number]) if you change your mind about wanting to receive social work services and resources, or if additional social work needs are identified in the near future. Follow-Up:  No Follow-Up Required, Per Patient.     Patient Care Plan: CCM Pharmacy Care Plan     Problem Identified: Problem: Hypertension, Hyperlipidemia, Atrial Fibrillation, Coronary Artery Disease, GERD, and Gout  Long-Range Goal: Patient-Specific Goal   Start Date: 05/15/2021  Expected End Date: 05/15/2022  Recent Progress: On track  Priority: High  Note:   Current Barriers:  Unable to independently monitor therapeutic efficacy Unable to achieve control of cholesterol   Pharmacist Clinical Goal(s):  Patient will achieve adherence to monitoring guidelines and medication adherence to achieve  therapeutic efficacy achieve control of cholesterol as evidenced by next lipid panel  through collaboration with PharmD and provider.   Interventions: 1:1 collaboration with [name], MD regarding development and update of comprehensive plan of care as evidenced by provider attestation and co-signature Inter-disciplinary care team collaboration (see longitudinal plan of care) Comprehensive medication review performed; medication list updated in electronic medical record  Hypertension (BP goal <140/90) -Controlled -Current treatment: Diltiazem 120 mg 1 capsule daily - Appropriate, Effective, Safe, Accessible -Medications previously tried: n/a  -Current home readings: not checked recently -Current dietary habits: trying to eat healthier -Current exercise habits: limited by back pain -Denies hypotensive/hypertensive symptoms -Educated on Exercise goal of 150 minutes per week; Importance of home blood pressure monitoring; Proper BP monitoring technique; -Counseled to monitor BP at home weekly, document, and provide log at future appointments -Counseled on diet and exercise extensively Recommended to continue current medication  Swelling (Goal: minimize fluid retention) -Controlled -Current treatment  Torsemide 20 mg 1 tablet twice daily - Appropriate, Effective, Safe, Accessible Potassium chloride 20 mEq 1 tablet daily - Appropriate, Effective, Safe, Accessible -Medications previously tried: none  -Recommended to continue current medication   Hyperlipidemia: (LDL goal < 70) -Uncontrolled -Current treatment: No medication -Medications previously tried: statins (myalgias)  -Current dietary patterns: eating out some but trying to each healthier -Current exercise habits: none -Educated on Cholesterol goals;  Benefits of statin for ASCVD risk reduction; Importance of limiting foods high in cholesterol; -Counseled on diet and exercise extensively Recommended repeat lipid panel  and initiation of cholesterol therapy.  CAD (Goal: prevent heart events) -Uncontrolled -Current treatment  Nitroglycerin 0.4 mg 1 tablet as needed - Appropriate, Effective, Safe, Accessible -Medications previously tried: none  -Recommended to continue current medication Recommended verifying expiration date on nitroglycerin.  Pre-diabetes (A1c goal <6.5%) -Controlled -Current medications: No medications -Medications previously tried: none  -Current home glucose readings: 190, 147, 173, 233, 153, 217, 125, 112172, 129, 114, 139 fasting glucose: n/a post prandial glucose: n/a -Denies hypoglycemic/hyperglycemic symptoms -Current meal patterns:  breakfast: n/a  lunch: n/a  dinner: n/a snacks: n/a drinks: n/a -Current exercise: none -Educated on A1c and blood sugar goals; -Counseled to check feet daily and get yearly eye exams -Counseled on diet and exercise extensively   Atrial Fibrillation (Goal: prevent stroke and major bleeding) -Controlled -CHADSVASC: 6 -Current treatment: Rate control: Diltiazem 120 mg 1 capsule daily - Appropriate, Effective, Safe, Accessible Anticoagulation: Eliquis 5 mg 1 tablet twice daily - Appropriate, Effective, Safe, Accessible -Medications previously tried: none -Home BP and HR readings: refer to above  -Counseled on increased risk of stroke due to Afib and benefits of anticoagulation for stroke prevention; bleeding risk associated with Eliquis and importance of self-monitoring for signs/symptoms of bleeding; avoidance of NSAIDs due to increased bleeding risk with anticoagulants; -Recommended to continue current medication  GERD (Goal: minimize symptoms) -Controlled -Current treatment  Omeprazole 40 mg 1 capsule daily - in AM  - Appropriate, Effective, Safe, Accessible -Medications previously tried: none  -Counseled on non-pharmacologic management of symptoms such as elevating the head of your bed, avoiding eating 2-3 hours before bed,  avoiding triggering foods such  as acidic, spicy, or fatty foods, eating smaller meals, and wearing clothes that are loose around the waist Recommended taking before food.  Pain (Goal: minimize pain) -Uncontrolled -Current treatment  Oxycodone 30 mg 1 tablet as needed for pain - Appropriate, Effective, Query Safe, Accessible Gabapentin 100 mg 1 capsule three times daily - twice daily - Appropriate, Query effective, Safe, Accessible -Medications previously tried: n/a  -Counseled on risk for sedation with taking both of these medications and he makes sure to separate them.  Hypothyroidism (Goal: 0.35-4.5) -Uncontrolled -Current treatment  Levothyroxine 200 mcg 1 tablet daily -Medications previously tried: none  - Consider dose decrease based on lower TSH.  Restless legs syndrome (Goal: minimize symptoms) -Uncontrolled -Current treatment  Pramipexole 0.5 mg 1 tablet three times daily - Appropriate, Query effective, Safe, Accessible -Medications previously tried: ropinirole (not effective) -Counseled on limiting caffeine intake. Recommended trial of increased gabapentin dose to help with RLS symptoms.  Onchomycosis (Goal: cure fungus) -Controlled -Current treatment  None -Medications previously tried: terbinafine -Recommended to continue current medication   Health Maintenance -Vaccine gaps: shingles, COVID, influenza, PCV20 -Current therapy:  Fluticasone 50 mcg as needed Vitamin C daily as needed Diclofenac gel 1% as needed Vitamin B12  (2 months)  Magnesium  -Educated on Cost vs benefit of each product must be carefully weighed by individual consumer -Patient is satisfied with current therapy and denies issues -Recommended to continue current medication  Patient Goals/Self-Care Activities Patient will:  - focus on medication adherence by setting alarms check glucose weekly, document, and provide at future appointments check blood pressure at least weekly, document, and  provide at future appointments target a minimum of 150 minutes of moderate intensity exercise weekly  Follow Up Plan: The care management team will reach out to the patient again over the next 30 days.      Patient Care Plan: RN Care Manager Plan of Care     Problem Identified: Chronic Disease Management and Care Coordination Needs (HF, CAD, Atrial Fib,HTN, HLD, chronic pain)   Priority: High     Long-Range Goal: Establish Plan of Care for Chronic Disease Management Needs (HF, CAD, Atrial Fib,HTN, HLD, chronic pain)   Start Date: 06/09/2021  Expected End Date: 12/06/2021  This Visit's Progress: On track  Priority: High  Note:   Current Barriers:  Knowledge Deficits related to plan of care for management of Atrial Fibrillation, CHF, CAD, HTN, HLD, Osteoarthritis, and chronic pain Chronic Disease Management support and education needs related to Atrial Fibrillation, CHF, CAD, HTN, HLD, Osteoarthritis, and chronic pain States that he lifted a heavy propane tank yesterday and his back has been aching more last night and today.  States his medications and a hot bath have helped his pain.  States he is to have hernia surgery on 06/29/21.  States his girlfriend is going to stay with him while he is recovering from his surgery. Denies any chest pains, increase in swelling or shortness of breath.  States he is weighing daily and he is trying to take his fluid pill as directed.  States he is trying to eat less salt but he still goes out to a [employer] several times a week with his girlfriend. States his B/P is good when he checks it  RNCM Clinical Goal(s):  Patient will verbalize understanding of plan for management of Atrial Fibrillation, CHF, CAD, HTN, HLD, Osteoarthritis, and verbalize understanding of plan for management of Atrial Fibrillation, CHF, CAD, HTN, HLD, Osteoarthritis, and chronic pain verbalize basic understanding  of  Atrial Fibrillation, CHF, CAD, HTN, HLD, Osteoarthritis,  and verbalize understanding of plan for management of Atrial Fibrillation, CHF, CAD, HTN, HLD, Osteoarthritis, and chronic pain disease process and self health management plan . take all medications exactly as prescribed and will call provider for medication related questions attend all scheduled medical appointments: Cardiology 06/28/21, CCM PharmD 08/11/21 demonstrate Improved adherence to prescribed treatment plan for Atrial Fibrillation, CHF, CAD, HTN, HLD, Osteoarthritis, and chronic pain as evidenced by readings within limits, voices adherence with plan of care continue to work with RN Care Manager to address care management and care coordination needs related to  Atrial Fibrillation, CHF, CAD, HTN, HLD, Osteoarthritis, and chronic pain  through collaboration with RN Care manager, provider, and care team.   Interventions: 1:1 collaboration with primary care provider regarding development and update of comprehensive plan of care as evidenced by provider attestation and co-signature Inter-disciplinary care team collaboration (see longitudinal plan of care) Evaluation of current treatment plan related to  self management and patient's adherence to plan as established by provider  Pain Interventions:Goal on track:  Yes Pain assessment performed Medications reviewed Reviewed provider established plan for pain management; Discussed importance of adherence to all scheduled medical appointments; Counseled on the importance of reporting any/all new or changed pain symptoms or management strategies to pain management provider; Discussed use of relaxation techniques and/or diversional activities to assist with pain reduction (distraction, imagery, relaxation, massage, acupressure, TENS, heat, and cold application;  Hypertension Interventions:Goal on track:  Yes Last practice recorded BP readings:  BP Readings from Last 3 Encounters:  06/05/21 138/78  05/15/21 (!) 144/80  03/30/21 132/80  Most  recent eGFR/CrCl: No results found for: EGFR  No components found for: CRCL  Evaluation of current treatment plan related to hypertension self management and patient's adherence to plan as established by provider; Provided education to patient re: stroke prevention, s/s of heart attack and stroke; Reviewed medications with patient and discussed importance of compliance; Advised patient, providing education and rationale, to monitor blood pressure daily and record, calling PCP for findings outside established parameters;  Hyperlipidemia Interventions:Goal on track:  Yes Medication review performed; medication list updated in electronic medical record.  Provider established cholesterol goals reviewed; Counseled on importance of regular laboratory monitoring as prescribed; Reviewed role and benefits of statin for ASCVD risk reduction;  Heart Failure Interventions:Goal on track:  Yes Basic overview and discussion of pathophysiology of Heart Failure reviewed; Provided education on low sodium diet; Advised patient to weigh each morning after emptying bladder; Discussed importance of daily weight and advised patient to weigh and record daily; Reviewed role of diuretics in prevention of fluid overload and management of heart failure; CAD Interventions: Goal on track:  Yes Assessed understanding of CAD diagnosis Medications reviewed including medications utilized in CAD treatment plan Provided education on importance of blood pressure control in management of CAD; Provided education on Importance of limiting foods high in cholesterol; Reviewed Importance of taking all medications as prescribed Reviewed Importance of attending all scheduled provider appointments Advised to report any changes in symptoms or exercise tolerance  AFIB Interventions: Goal on track:  Yes   Counseled on increased risk of stroke due to Afib and benefits of anticoagulation for stroke prevention; Reviewed importance of  adherence to anticoagulant exactly as prescribed; Counseled on avoidance of NSAIDs due to increased bleeding risk with anticoagulants;  Patient Goals/Self-Care Activities: Patient will self administer medications as prescribed Patient will attend all scheduled provider appointments Patient will call pharmacy  for medication refills Patient will continue to perform ADL's independently Patient will continue to perform IADL's independently Patient will call provider office for new concerns or questions call office if I gain more than 2 pounds in one day or 5 pounds in one week keep legs up while sitting use salt in moderation watch for swelling in feet, ankles and legs every day weigh myself daily begin a heart failure diary follow rescue plan if symptoms flare-up eat more whole grains, fruits and vegetables, lean meats and healthy fats - make a plan to exercise regularly - make a plan to eat healthy - take medicine as prescribed - check blood pressure 3 times per week - choose a place to take my blood pressure (home, clinic or office, retail store) - write blood pressure results in a log or diary - learn about high blood pressure - take blood pressure log to all doctor appointments - call doctor for signs and symptoms of high blood pressure - limit salt intake to 2319m/day - call for medicine refill 2 or 3 days before it runs out - take all medications exactly as prescribed - call doctor with any symptoms you believe are related to your medicine - learn relaxation techniques - practice acceptance of chronic pain - practice relaxation or meditation daily - tell myself I can (not I can't) - think of new ways to do favorite things - use distraction techniques - use relaxation during pain Follow Up Plan:  Telephone follow up appointment with care management team member scheduled for:  08/18/21 The patient has been provided with contact information for the care management team and has  been advised to call with any health related questions or concerns.          Patient verbalizes understanding of instructions and care plan provided today and agrees to view in [facility] Active MyChart status confirmed with patient.   The pharmacy team will reach out to the patient again over the next 30 days.   MViona Gilmore [facility]

## 2021-08-15 ENCOUNTER — Ambulatory Visit: Payer: Medicare Other | Admitting: Family Medicine

## 2021-08-16 ENCOUNTER — Ambulatory Visit (INDEPENDENT_AMBULATORY_CARE_PROVIDER_SITE_OTHER): Payer: Medicare Other | Admitting: Family Medicine

## 2021-08-16 ENCOUNTER — Encounter: Payer: Self-pay | Admitting: Family Medicine

## 2021-08-16 VITALS — BP 110/76 | HR 99 | Temp 98.1°F | Wt 213.0 lb

## 2021-08-16 DIAGNOSIS — B029 Zoster without complications: Secondary | ICD-10-CM

## 2021-08-16 NOTE — Progress Notes (Signed)
° °  Subjective:    Patient ID: [name], male    DOB: 1941/07/02, 81 y.o.   MRN: [id number]  HPI Here to follow up shingles on the left chest and upper back. We saw him on 08-07-21 and started him on 10 days of Valtrex. He now feels much better. There os no more pain, just some itching.    Review of Systems  Constitutional: Negative.   Respiratory: Negative.    Cardiovascular: Negative.   Skin:  Positive for rash.      Objective:   Physical Exam Constitutional:      General: He is not in acute distress.    Appearance: Normal appearance.  Cardiovascular:     Rate and Rhythm: Normal rate and regular rhythm.     Pulses: Normal pulses.     Heart sounds: Normal heart sounds.  Pulmonary:     Effort: Pulmonary effort is normal.     Breath sounds: Normal breath sounds.  Skin:    Comments: Several patches of pink scaly skin on the left chest and upper back that are drying up   Neurological:     Mental Status: He is alert.          Assessment & Plan:  Shingles, resolving as expected. He plans to get a shingles vaccine in about 2 months. [name], MD

## 2021-08-16 NOTE — Telephone Encounter (Signed)
No episodes of chest pain recently.  He thinks he is over it.  He said he thinks maybe it was gas.  He saw PCP today and was given a clean bill of health.  I adv to go to the ER for any further episodes of chest pain.  He voices understanduing and agreement.  At this point he wishes to keep his upcoming appointment in March with Richardson Dopp, PA-C.

## 2021-08-18 ENCOUNTER — Ambulatory Visit: Payer: Medicare Other

## 2021-08-18 DIAGNOSIS — I2510 Atherosclerotic heart disease of native coronary artery without angina pectoris: Secondary | ICD-10-CM

## 2021-08-18 DIAGNOSIS — M5440 Lumbago with sciatica, unspecified side: Secondary | ICD-10-CM

## 2021-08-18 DIAGNOSIS — I10 Essential (primary) hypertension: Secondary | ICD-10-CM

## 2021-08-18 DIAGNOSIS — R0609 Other forms of dyspnea: Secondary | ICD-10-CM

## 2021-08-18 DIAGNOSIS — I4892 Unspecified atrial flutter: Secondary | ICD-10-CM

## 2021-08-18 DIAGNOSIS — E785 Hyperlipidemia, unspecified: Secondary | ICD-10-CM

## 2021-08-18 NOTE — Patient Instructions (Signed)
Visit Information  Thank you for taking time to visit with me today. Please don't hesitate to contact me if I can be of assistance to you before our next scheduled telephone appointment.  Following are the goals we discussed today:  Take all medications as prescribed Attend all scheduled provider appointments Call pharmacy for medication refills 3-7 days in advance of running out of medications Perform all self care activities independently  Perform IADL's (shopping, preparing meals, housekeeping, managing finances) independently Call provider office for new concerns or questions  call office if I gain more than 2 pounds in one day or 5 pounds in one week keep legs up while sitting watch for swelling in feet, ankles and legs every day weigh myself daily follow rescue plan if symptoms flare-up track symptoms and what helps feel better or worse check pulse (heart) rate once a day make a plan to eat healthy take medicine as prescribed check blood pressure daily choose a place to take my blood pressure (home, clinic or office, retail store) take blood pressure log to all doctor appointments call doctor for signs and symptoms of high blood pressure take medications for blood pressure exactly as prescribed eat more whole grains, fruits and vegetables, lean meats and healthy fats limit salt intake to 2300mg /day develop an exercise routine - learn relaxation techniques - practice acceptance of chronic pain - practice relaxation or meditation daily - tell myself I can (not I can't) - think of new ways to do favorite things - use distraction techniques - use relaxation during pain  Our next appointment is by telephone on 10/13/21 at 9 AM  Please call the care guide team at [phone] if you need to cancel or reschedule your appointment.   If you are experiencing a Mental Health or [place] or need someone to talk to, please call the Suicide and Crisis Lifeline:  988 call the [place] National Suicide Prevention Lifeline: 7574851344 or TTY: 239 567 9977 TTY 825-311-1904) to talk to a trained counselor call 1-800-273-TALK (toll free, 24 hour hotline) call 911   Patient verbalizes understanding of instructions and care plan provided today and agrees to view in [place]. Active MyChart status confirmed with patient.   Peter Garter RN, Jackquline Denmark, CDE Care Management Coordinator [facility] [phone]
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
## 2021-08-21 ENCOUNTER — Telehealth: Payer: Self-pay | Admitting: Pharmacist

## 2021-08-21 NOTE — Telephone Encounter (Signed)
-----   Message from [name], MD sent at 08/14/2021  1:47 PM EST ----- Regarding: RE: Restless legs medication I agree. Tell him to take 3 pills (300 mg) of Gabapentin at bedtime for a few nights. We can keep pushing the dose if needed  ----- Message ----- From: Viona Gilmore, [facility] Sent: 08/14/2021   9:56 AM EST To: [name], MD Subject: Restless legs medication                       Hi,  Mr. Donahoe reports that he isn't having any success with the ropinirole or pramipexole anymore for his restless legs. He has tried both and even tried taking more than the recommended amount of both and still no success. I did notice that he was on a low dose of gabapentin and that is usually one of the first line options now for RLS. What do you think about increasing his dose some for that to see if that helps? More specifically the dose before bedtime.  Let me know! Maddie

## 2021-08-21 NOTE — Telephone Encounter (Signed)
Called patient to make him aware of the change with gabapentin. Patient is aware to increase to 3 capsules of gabapentin at bedtime to see if this helps with restless legs symptoms. Plan to follow up in 2 weeks to check back in on symptoms.

## 2021-08-22 ENCOUNTER — Telehealth: Payer: Self-pay | Admitting: Pharmacist

## 2021-08-22 ENCOUNTER — Telehealth: Payer: Self-pay | Admitting: Cardiovascular Disease

## 2021-08-22 DIAGNOSIS — E785 Hyperlipidemia, unspecified: Secondary | ICD-10-CM | POA: Diagnosis not present

## 2021-08-22 DIAGNOSIS — I2510 Atherosclerotic heart disease of native coronary artery without angina pectoris: Secondary | ICD-10-CM | POA: Diagnosis not present

## 2021-08-22 DIAGNOSIS — I10 Essential (primary) hypertension: Secondary | ICD-10-CM | POA: Diagnosis not present

## 2021-08-22 MED ORDER — DILTIAZEM HCL ER COATED BEADS 120 MG PO CP24: ORAL_CAPSULE | ORAL | 3 refills | Status: DC

## 2021-08-22 MED ORDER — POTASSIUM CHLORIDE CRYS ER 20 MEQ PO TBCR: 20.0000 meq | EXTENDED_RELEASE_TABLET | Freq: Every day | ORAL | 3 refills | Status: DC

## 2021-08-22 NOTE — Telephone Encounter (Signed)
Called pt and requested that medication be sent to [facility] in [place], [place]. Pt's medication was sent as requested to the preferred pharmacy. Confirmation received.

## 2021-08-22 NOTE — Telephone Encounter (Signed)
°*  STAT* If patient is at the pharmacy, call can be transferred to refill team.   1. Which medications need to be refilled? (please list name of each medication and dose if known) diltiazem (CARDIZEM CD) 120 MG 24 hr capsule  diclofenac sodium (VOLTAREN) 1 % GEL  potassium chloride SA (KLOR-CON M) 20 MEQ tablet  diclofenac sodium (VOLTAREN) 1 % GEL  2. Which pharmacy/location (including street and city if local pharmacy) is medication to be sent to? [facility] - [place], [place] - [place] [address]  3. Do they need a 30 day or 90 day supply? [place]

## 2021-08-22 NOTE — Chronic Care Management (AMB) (Addendum)
Chronic Care Management Pharmacy Assistant   Name: AJAX SCHROLL  MRN: [id number] DOB: October 23, 1940  Reason for Encounter: Onboarding  Recent office visits:  None  Recent consult visits:  None  Hospital visits:  None  Medications: Outpatient Encounter Medications as of 08/22/2021  Medication Sig   apixaban (ELIQUIS) 5 MG TABS tablet Take 1 tablet (5 mg total) by mouth 2 (two) times daily.   Ascorbic Acid (VITAMIN C PO) Take 4 tablets by mouth daily as needed (flu like symptopms).    clotrimazole (LOTRIMIN) 1 % cream Apply 1 application topically daily.   diclofenac sodium (VOLTAREN) 1 % GEL Apply 1 application topically daily as needed (ankle pain).   diltiazem (CARDIZEM CD) 120 MG 24 hr capsule TAKE 1 CAPSULE BY MOUTH EVERY DAY   fluticasone (FLONASE) 50 MCG/ACT nasal spray Place 1 spray into both nostrils daily as needed for allergies or rhinitis.   gabapentin (NEURONTIN) 100 MG capsule TAKE 1 CAPSULE BY MOUTH THREE TIMES A DAY   ketoconazole (NIZORAL) 2 % cream Apply 1 application topically 2 (two) times daily as needed for irritation.   levothyroxine (SYNTHROID) 200 MCG tablet Take 1 tablet (200 mcg total) by mouth daily.   magnesium oxide (MAG-OX) 400 MG tablet Take 400 mg by mouth daily as needed (restless leg).   nitroGLYCERIN (NITROSTAT) 0.4 MG SL tablet Place 1 tablet (0.4 mg total) under the tongue every 5 (five) minutes as needed for chest pain.   omeprazole (PRILOSEC) 40 MG capsule Take 1 capsule (40 mg total) by mouth daily.   oxycodone (ROXICODONE) 30 MG immediate release tablet Take 1 tablet (30 mg total) by mouth every 6 (six) hours as needed for pain.   potassium chloride SA (KLOR-CON M) 20 MEQ tablet Take 1 tablet (20 mEq total) by mouth daily.   ropinirole (REQUIP) 5 MG tablet Take 1 tablet (5 mg total) by mouth in the morning, at noon, and at bedtime.   torsemide (DEMADEX) 20 MG tablet Take 1 tablet (20 mg total) by mouth 2 (two) times daily.   vitamin B-12  (CYANOCOBALAMIN) 100 MCG tablet Take 100 mcg by mouth daily.   No facility-administered encounter medications on file as of 08/22/2021.     Referred by: [name], MD  Reason for referral: Chronic care management  Reviewed chart for medication changes ahead of medication coordination call.  BP Readings from Last 3 Encounters:  08/16/21 110/76  08/07/21 110/60  08/02/21 136/80    Lab Results  Component Value Date   HGBA1C 6.7 (H) 06/26/2021     Verbal consent obtained for [facility] enhanced pharmacy services (medication synchronization, adherence packaging, delivery coordination). A medication sync plan was created to allow patient to get all medications delivered once every 30 to 90 days per patient preference. Patient understands they have freedom to choose pharmacy and clinical pharmacist will coordinate care between all prescribers and [facility].  Patient requested to obtain medications through Adherence Packaging  30 Days   Med Sync Plan: Oxycodone 30mg   PRN  Dilitiazem 120 mg 30 September 20, 2021 Gabapentin - 100 mg 45        September 01, 2021 Vitamin B12  500 mcg 115 October 17, 2021 Omeprazole 40 mg 20 September 10, 2021 Potassium 10 mEq  67 September 23, 2021 Voltaren PRN  PRN  Torsemide - 20mg  120 October 20, 2021   Prescriptions requested from PCP and specialists. All specialist are in the same Cardiology office, new prescriptions requested  to be sent to [facility] with Marguarite Arbour.  Hold time with cardiology 42 minutes.   Care Gaps: AWV - scheduled 08/24/2021 Last BP - 110/60 on 08/07/2020 Last A1C- 6.7 on 06/26/2022 Covid vaccine - never done Malb - never done Hep C screen - never done Shingrix - never done Pneumovax - overdue  Star Rating Drugs: None  [place] Pharmacist Assistant [phone]

## 2021-08-23 ENCOUNTER — Other Ambulatory Visit: Payer: Self-pay

## 2021-08-23 MED ORDER — OMEPRAZOLE 40 MG PO CPDR: 40.0000 mg | DELAYED_RELEASE_CAPSULE | Freq: Every day | ORAL | 3 refills | Status: DC

## 2021-08-24 ENCOUNTER — Ambulatory Visit: Payer: Medicare Other

## 2021-08-29 ENCOUNTER — Ambulatory Visit (INDEPENDENT_AMBULATORY_CARE_PROVIDER_SITE_OTHER): Payer: Medicare Other | Admitting: Family Medicine

## 2021-08-29 ENCOUNTER — Encounter: Payer: Self-pay | Admitting: Family Medicine

## 2021-08-29 ENCOUNTER — Ambulatory Visit (INDEPENDENT_AMBULATORY_CARE_PROVIDER_SITE_OTHER): Payer: Medicare Other

## 2021-08-29 VITALS — Ht 69.0 in | Wt 214.0 lb

## 2021-08-29 VITALS — BP 124/76 | HR 59 | Temp 98.4°F | Wt 214.0 lb

## 2021-08-29 DIAGNOSIS — M5440 Lumbago with sciatica, unspecified side: Secondary | ICD-10-CM

## 2021-08-29 DIAGNOSIS — Z0000 Encounter for general adult medical examination without abnormal findings: Secondary | ICD-10-CM

## 2021-08-29 DIAGNOSIS — F1190 Opioid use, unspecified, uncomplicated: Secondary | ICD-10-CM | POA: Diagnosis not present

## 2021-08-29 DIAGNOSIS — G8929 Other chronic pain: Secondary | ICD-10-CM | POA: Diagnosis not present

## 2021-08-29 MED ORDER — OXYCODONE HCL 30 MG PO TABS: 30.0000 mg | ORAL_TABLET | Freq: Four times a day (QID) | ORAL | 0 refills | Status: DC | PRN

## 2021-08-29 MED ORDER — ISOSORBIDE MONONITRATE ER 60 MG PO TB24: 60.0000 mg | ORAL_TABLET | Freq: Every day | ORAL | 3 refills | Status: DC

## 2021-08-29 MED ORDER — FLUCONAZOLE 150 MG PO TABS: 150.0000 mg | ORAL_TABLET | Freq: Once | ORAL | 11 refills | Status: AC

## 2021-08-29 NOTE — Progress Notes (Signed)
Subjective:   [name] is a 81 y.o. male who presents for Medicare Annual/Subsequent preventive examination.  Review of Systems    Virtual Visit via Telephone Note  I connected with  [name] on 08/29/21 at  9:45 AM EST by telephone and verified that I am speaking with the correct person using two identifiers.  Location: Patient: Home Provider: Office Persons participating in the virtual visit: patient/Nurse Health Advisor   I discussed the limitations, risks, security and privacy concerns of performing an evaluation and management service by telephone and the availability of in person appointments. The patient expressed understanding and agreed to proceed.  Interactive audio and video telecommunications were attempted between this nurse and patient, however failed, due to patient having technical difficulties OR patient did not have access to video capability.  We continued and completed visit with audio only.  Some vital signs may be absent or patient reported.   Criselda Peaches, LPN  Cardiac Risk Factors include: advanced age (>46men, >26 women);hypertension     Objective:    Today's Vitals   08/29/21 0945  Weight: 214 lb (97.1 kg)  Height: 5\' 9"  (1.753 m)   Body mass index is 31.6 kg/m.  Advanced Directives 08/29/2021 08/02/2021 06/26/2021 03/20/2021 02/27/2021 12/25/2020 12/20/2020  Does Patient Have a Medical Advance Directive? Yes Yes Yes No No No No  Type of Paramedic of [place];Living will Healthcare Power of [place];Living will - - - -  Does patient want to make changes to medical advance directive? No - Patient declined - No - Patient declined - - - -  Copy of [place] in Chart? No - copy requested - No - copy requested - - - -  Would patient like information on creating a medical advance directive? - - - No - Patient declined No - Patient declined Yes (ED - Information included in  AVS) No - Patient declined  Pre-existing out of facility DNR order (yellow form or pink MOST form) - - - - - - -    Current Medications (verified) Outpatient Encounter Medications as of 08/29/2021  Medication Sig   apixaban (ELIQUIS) 5 MG TABS tablet Take 1 tablet (5 mg total) by mouth 2 (two) times daily.   Ascorbic Acid (VITAMIN C PO) Take 4 tablets by mouth daily as needed (flu like symptopms).    clotrimazole (LOTRIMIN) 1 % cream Apply 1 application topically daily.   diclofenac sodium (VOLTAREN) 1 % GEL Apply 1 application topically daily as needed (ankle pain).   diltiazem (CARDIZEM CD) 120 MG 24 hr capsule TAKE 1 CAPSULE BY MOUTH EVERY DAY   fluticasone (FLONASE) 50 MCG/ACT nasal spray Place 1 spray into both nostrils daily as needed for allergies or rhinitis.   gabapentin (NEURONTIN) 100 MG capsule TAKE 1 CAPSULE BY MOUTH THREE TIMES A DAY   ketoconazole (NIZORAL) 2 % cream Apply 1 application topically 2 (two) times daily as needed for irritation.   levothyroxine (SYNTHROID) 200 MCG tablet Take 1 tablet (200 mcg total) by mouth daily.   magnesium oxide (MAG-OX) 400 MG tablet Take 400 mg by mouth daily as needed (restless leg).   nitroGLYCERIN (NITROSTAT) 0.4 MG SL tablet Place 1 tablet (0.4 mg total) under the tongue every 5 (five) minutes as needed for chest pain.   omeprazole (PRILOSEC) 40 MG capsule Take 1 capsule (40 mg total) by mouth daily.   oxycodone (ROXICODONE) 30 MG immediate release tablet Take  1 tablet (30 mg total) by mouth every 6 (six) hours as needed for pain.   potassium chloride SA (KLOR-CON M) 20 MEQ tablet Take 1 tablet (20 mEq total) by mouth daily.   ropinirole (REQUIP) 5 MG tablet Take 1 tablet (5 mg total) by mouth in the morning, at noon, and at bedtime.   torsemide (DEMADEX) 20 MG tablet Take 1 tablet (20 mg total) by mouth 2 (two) times daily.   vitamin B-12 (CYANOCOBALAMIN) 100 MCG tablet Take 100 mcg by mouth daily.   No facility-administered encounter  medications on file as of 08/29/2021.    Allergies (verified) Brilinta [ticagrelor], Colchicine, Metoprolol tartrate, Mirapex [pramipexole dihydrochloride], Shellfish allergy, Statins, Zolpidem, Coconut oil, Lasix [furosemide], Levaquin [levofloxacin], and Trazodone and nefazodone   History: Past Medical History:  Diagnosis Date   Adenomatous polyp of colon 2007   Arthritis    Atrial flutter ([place])    AVM (arteriovenous malformation) 2011   a. S/p argon plasma coagulation and ablation in 2011, 2017.   Bradycardia    a. H/o almost 7sec pause nocturnally during 2011 admission. Also has h/o fatigue with BB.   CAD (coronary artery disease)    a. Nonobst disease by cath 2009. b. s/p PCI 10/2014 with DES to [place], patent by relook 11/2014 (Brilinta changed to Plavix with improved sx).   Chronic diastolic CHF (congestive heart failure) (HCC)    Depression    Diverticulosis    Gastritis 2011   GERD (gastroesophageal reflux disease)    History of hiatal hernia    Hyperlipidemia    Hypertension    Hypothyroidism    Myocardial infarction ([facility])    Obstructive sleep apnea    -no cpap use   Pre-diabetes    Premature atrial contractions Holter 2016   Presence of permanent cardiac pacemaker    PVC's (premature ventricular contractions) Holter 2016   Statin intolerance    Transfusion history    several years ago -GI bleed   Past Surgical History:  Procedure Laterality Date   ANKLE SURGERY Left    APPENDECTOMY     CARDIAC CATHETERIZATION N/A 11/26/2014   Procedure: Left Heart Cath and Coronary Angiography;  Surgeon: Burnell Blanks, MD;  Location: [facility];  Service: Cardiovascular;  Laterality: N/A;   CHOLECYSTECTOMY N/A 08/23/2016   Procedure: LAPAROSCOPIC CHOLECYSTECTOMY;  Surgeon: Coralie Keens, MD;  Location: [facility];  Service: General;  Laterality: N/A;   COLONOSCOPY  08-23-05   per Dr. Deatra Ina, adenomatous polyps, repeat in 5 yrs    COLONOSCOPY WITH PROPOFOL N/A  12/06/2015   Procedure: COLONOSCOPY WITH PROPOFOL;  Surgeon: Doran Stabler, MD;  Location: [facility];  Service: Gastroenterology;  Laterality: N/A;   ENTEROSCOPY N/A 12/06/2015   Procedure: ENTEROSCOPY;  Surgeon: Doran Stabler, MD;  Location: [facility];  Service: Gastroenterology;  Laterality: N/A;   ESOPHAGOGASTRODUODENOSCOPY  08-23-05   per Dr. Deatra Ina, cauterized jejunal AVMs    [place] N/A 12/06/2015   Procedure: HOT HEMOSTASIS (ARGON PLASMA COAGULATION/BICAP);  Surgeon: Doran Stabler, MD;  Location: [facility];  Service: Gastroenterology;  Laterality: N/A;   INGUINAL HERNIA REPAIR Left 06/29/2021   Procedure: LAPAROSCOPIC LEFT INGUINAL HERNIA REPAIR WITH MESH;  Surgeon: Ralene Ok, MD;  Location: [facility];  Service: General;  Laterality: Left;   LEFT HEART CATHETERIZATION WITH CORONARY ANGIOGRAM N/A 09/08/2012   Procedure: LEFT HEART CATHETERIZATION WITH CORONARY ANGIOGRAM;  Surgeon: Burnell Blanks, MD;  Location: [facility];  Service: Cardiovascular;  Laterality: N/A;   LEFT HEART CATHETERIZATION WITH CORONARY ANGIOGRAM N/A 11/16/2014   Procedure: LEFT HEART CATHETERIZATION WITH CORONARY ANGIOGRAM;  Surgeon: Leonie Man, MD;  Location: [facility] CATH LAB;  Service: Cardiovascular;  Laterality: N/A;   PACEMAKER IMPLANT N/A 12/24/2016   Procedure: Pacemaker Implant;  Surgeon: Deboraha Sprang, MD;  Location: [facility] CV LAB;  Service: Cardiovascular;  Laterality: N/A;   SKIN GRAFT Right    leg   TONSILLECTOMY     UMBILICAL HERNIA REPAIR N/A 06/29/2021   Procedure: OPEN UMBILICAL HERNIA REPAIR WITH MESH;  Surgeon: Ralene Ok, MD;  Location: [facility];  Service: General;  Laterality: N/A;   Family History  Problem Relation Age of Onset   Leukemia Mother    Heart disease Father    Stroke Father    Heart attack Father    Alcoholism Paternal Uncle    Alcoholism Maternal Grandfather    Colon cancer Neg Hx    Esophageal cancer Neg Hx     Social History   Socioeconomic History   Marital status: Widowed    Spouse name: Not on file   Number of children: 1   Years of education: 108   Highest education level: 12th grade  Occupational History   Occupation: Disabled    Employer: UNEMPLOYED  Tobacco Use   Smoking status: Former    Packs/day: 2.00    Years: 50.00    Pack years: 100.00    Types: Cigarettes    Quit date: 07/23/2002    Years since quitting: 19.1    Passive exposure: Past   Smokeless tobacco: Never  Vaping Use   Vaping Use: Never used  Substance and Sexual Activity   Alcohol use: No    Alcohol/week: 0.0 standard drinks   Drug use: No   Sexual activity: Yes  Other Topics Concern   Not on file  Social History Narrative   Not on file   Social Determinants of Health   Financial Resource Strain: Low Risk    Difficulty of Paying Living Expenses: Not very hard  Food Insecurity: No Food Insecurity   Worried About Charity fundraiser in the Last Year: Never true   Ran Out of Food in the Last Year: Never true  Transportation Needs: No Transportation Needs   Lack of Transportation (Medical): No   Lack of Transportation (Non-Medical): No  Physical Activity: Inactive   Days of Exercise per Week: 0 days   Minutes of Exercise per Session: 0 min  Stress: No Stress Concern Present   Feeling of Stress : Not at all  Social Connections: Socially Isolated   Frequency of Communication with Friends and Family: More than three times a week   Frequency of Social Gatherings with Friends and Family: More than three times a week   Attends Religious Services: Never   Marine scientist or Organizations: No   Attends Archivist Meetings: Never   Marital Status: Widowed     Clinical Intake:  Pre-visit preparation completed: Yes Diabetic? No  Interpreter Needed?: No  Activities of Daily Living In your present state of health, do you have any difficulty performing the following activities: 08/29/2021  06/29/2021  Hearing? N -  [place]? N -  Difficulty concentrating or making decisions? N -  Walking or climbing stairs? N -  Dressing or bathing? N -  Doing errands, shopping? N N  Preparing Food and eating ?  N -  Using the Toilet? N -  In the past six months, have you accidently leaked urine? N -  Do you have problems with loss of bowel control? N -  Managing your Medications? N -  Managing your Finances? N -  Housekeeping or managing your Housekeeping? N -  Some recent data might be hidden    Patient Care Team: [name], MD as PCP - General Burnell Blanks, MD as PCP - Cardiology (Cardiology) Deboraha Sprang, MD as PCP - Electrophysiology (Cardiology) Dimitri Ped, RN as Case Manager Viona Gilmore, [facility] as Pharmacist (Pharmacist) [place], Maree Erie, LCSW as Social Worker (Licensed Clinical Social Worker)  Indicate any recent [employer] you may have received from other than [facility] providers in the past year (date may be approximate).     Assessment:   This is a routine wellness examination for [name].  Hearing/Vision screen Hearing Screening - Comments:: Wears hearing aids Vision Screening - Comments:: Wears glasses. Followed by [facility]  Dietary issues and exercise activities discussed: Current Exercise Habits: The patient does not participate in regular exercise at present, Type of exercise: stretching, Time (Minutes): 10, Frequency (Times/Week): 1, Weekly Exercise (Minutes/Week): 10, Intensity: Mild, Exercise limited by: None identified   Goals Addressed               This Visit's Progress     LIFESTYLE - DECREASE FALLS RISK (pt-stated)        Stay alive.       Depression Screen PHQ 2/9 Scores 08/29/2021 08/07/2021 06/05/2021 03/20/2021 03/20/2021 02/27/2021 08/23/2020  PHQ - 2 Score 0 1 2 0 0 1 0  PHQ- 9 Score 0 16 13 - - - -  Exception Documentation - - - - - - -    Fall Risk Fall Risk  08/29/2021 08/07/2021  03/20/2021 02/27/2021 08/23/2020  Falls in the past year? 1 1 - 1 1  Number falls in past yr: 1 1 1 1 1   Comment - - - gets vertigo 9x  Injury with Fall? 0 0 0 0 1  Comment Falls w/o injury or Medical attention needed - - - -  Risk Factor Category  - - - - -  Risk for fall due to : Medication side effect Impaired balance/gait;Orthopedic patient History of fall(s);Impaired balance/gait History of fall(s);Impaired balance/gait History of fall(s);Impaired balance/gait;Medication side effect  Follow up Falls evaluation completed;Education provided;Falls prevention discussed Falls evaluation completed Falls evaluation completed;Education provided;Falls prevention discussed Education provided;Falls prevention discussed Falls evaluation completed;Falls prevention discussed  Comment Followed by PCP - - - -    FALL RISK PREVENTION PERTAINING TO THE HOME:  Any stairs in or around the home? Yes  If so, are there any without handrails? No  Home free of loose throw rugs in walkways, pet beds, electrical cords, etc? Yes  Adequate lighting in your home to reduce risk of falls? Yes   ASSISTIVE DEVICES UTILIZED TO PREVENT FALLS:  Life alert? No  Use of a cane, walker or w/c? Yes  Grab bars in the bathroom? No  Shower chair or bench in shower? No  Elevated toilet seat or a handicapped toilet? No   TIMED UP AND GO:  Was the test performed? No . Audio visit  Cognitive Function: MMSE - Mini Mental State Exam 11/14/2017  Orientation to time 5  Orientation to Place 5  Registration 3  Attention/ Calculation 5  Recall 3  Language- name 2 objects 2  Language-  repeat 1  Language- follow 3 step command 3  Language- read & follow direction 1  Write a sentence 1  Copy design 1  Total score 30     6CIT Screen 08/29/2021  What Year? 0 points  What month? 0 points  What time? 0 points  Count back from 20 0 points  Months in reverse 0 points  Repeat phrase 0 points  Total Score 0     Immunizations Immunization History  Administered Date(s) Administered   Fluad Quad(high Dose 65+) 06/05/2021   Influenza Split 07/31/2011, 07/23/2012   Influenza Whole 04/25/2010   Influenza, High Dose Seasonal PF 03/19/2016, 04/09/2017, 05/22/2018, 05/27/2020   Pneumococcal Polysaccharide-23 11/17/2014   Td 03/09/2016    TDAP status: Up to date  Flu Vaccine status: Up to date  Pneumococcal vaccine status: Declined,  Education has been provided regarding the importance of this vaccine but patient still declined. Advised may receive this vaccine at local pharmacy or Health Dept. Aware to provide a copy of the vaccination record if obtained from local pharmacy or Health Dept. Verbalized acceptance and understanding.   Covid-19 vaccine status: Declined, Education has been provided regarding the importance of this vaccine but patient still declined. Advised may receive this vaccine at local pharmacy or Health Dept.or vaccine clinic. Aware to provide a copy of the vaccination record if obtained from local pharmacy or Health Dept. Verbalized acceptance and understanding.  Qualifies for Shingles Vaccine? Yes   Zostavax completed No   Shingrix Completed?: No.    Education has been provided regarding the importance of this vaccine. Patient has been advised to call insurance company to determine out of pocket expense if they have not yet received this vaccine. Advised may also receive vaccine at local pharmacy or Health Dept. Verbalized acceptance and understanding.  Screening Tests Health Maintenance  Topic Date Due   URINE MICROALBUMIN  Never done   Hepatitis C Screening  Never done   COVID-19 Vaccine (1) 09/14/2021 (Originally 04/21/1941)   Zoster Vaccines- Shingrix (1 of 2) 11/26/2021 (Originally 10/19/1959)   Pneumonia Vaccine 73+ Years old (2 - PCV) 08/29/2022 (Originally 11/17/2015)   TETANUS/TDAP  09/08/2030   INFLUENZA VACCINE  Completed   HPV VACCINES  Aged Out    Health  Maintenance  Health Maintenance Due  Topic Date Due   URINE MICROALBUMIN  Never done   Hepatitis C Screening  Never done    Colorectal cancer screening: No longer required.    Additional Screening:  Hepatitis C Screening: does qualify; Completed No  Vision Screening: Recommended annual ophthalmology exams for early detection of glaucoma and other disorders of the eye. Is the patient up to date with their annual eye exam?  Yes  Who is the provider or what is the name of the office in which the patient attends annual eye exams? [facility] If pt is not established with a provider, would they like to be referred to a provider to establish care? No .   Dental Screening: Recommended annual dental exams for proper oral hygiene  Community Resource Referral / Chronic Care Management:  CRR required this visit?  No   CCM required this visit?  No      Plan:     I have personally reviewed and noted the following in the patients chart:   Medical and social history Use of alcohol, tobacco or illicit drugs  Current medications and supplements including opioid prescriptions. Patient is currently taking opioid prescriptions. Information provided to patient regarding  non-opioid alternatives. Patient advised to discuss non-opioid treatment plan with their provider. Functional ability and status Nutritional status Physical activity Advanced directives List of other physicians Hospitalizations, surgeries, and ER visits in previous 12 months Vitals Screenings to include cognitive, depression, and falls Referrals and appointments  In addition, I have reviewed and discussed with patient certain preventive protocols, quality metrics, and best practice recommendations. A written personalized care plan for preventive services as well as general preventive health recommendations were provided to patient.     Criselda Peaches, LPN   [id number]   Nurse Notes: None

## 2021-08-29 NOTE — Addendum Note (Signed)
Addended by: Rosalyn Gess D on: 08/29/2021 02:29 PM   Modules accepted: Orders

## 2021-08-29 NOTE — Patient Instructions (Addendum)
Mr. [name] , Thank you for taking time to come for your Medicare Wellness Visit. I appreciate your ongoing commitment to your health goals. Please review the following plan we discussed and let me know if I can assist you in the future.   These are the goals we discussed:  Goals       Exercise 3x per week (30 min per time)      LIFESTYLE - DECREASE FALLS RISK (pt-stated)      Stay alive.        This is a list of the screening recommended for you and due dates:  Health Maintenance  Topic Date Due   Urine Protein Check  Never done   Hepatitis C Screening: USPSTF Recommendation to screen - Ages 55-79 yo.  Never done   COVID-19 Vaccine (1) 09/14/2021*   Zoster (Shingles) Vaccine (1 of 2) 11/26/2021*   Pneumonia Vaccine (2 - PCV) 08/29/2022*   Tetanus Vaccine  09/08/2030   Flu Shot  Completed   HPV Vaccine  Aged Out  *Topic was postponed. The date shown is not the original due date.   Opioid Pain Medicine Management Opioids are powerful medicines that are used to treat moderate to severe pain. When used for short periods of time, they can help you to: Sleep better. Do better in physical or occupational therapy. Feel better in the first few days after an injury. Recover from surgery. Opioids should be taken with the supervision of a trained health care provider. They should be taken for the shortest period of time possible. This is because opioids can be addictive, and the longer you take opioids, the greater your risk of addiction. This addiction can also be called opioid use disorder. What are the risks? Using opioid pain medicines for longer than 3 days increases your risk of side effects. Side effects include: Constipation. Nausea and vomiting. Breathing difficulties (respiratory depression). Drowsiness. Confusion. Opioid use disorder. Itching. Taking opioid pain medicine for a long period of time can affect your ability to do daily tasks. It also puts you at risk  for: Motor vehicle crashes. Depression. Suicide. Heart attack. Overdose, which can be life-threatening. What is a pain treatment plan? A pain treatment plan is an agreement between you and your health care provider. Pain is unique to each person, and treatments vary depending on your condition. To manage your pain, you and your health care provider need to work together. To help you do this: Discuss the goals of your treatment, including how much pain you might expect to have and how you will manage the pain. Review the risks and benefits of taking opioid medicines. Remember that a good treatment plan uses more than one approach and minimizes the chance of side effects. Be honest about the amount of medicines you take and about any drug or alcohol use. Get pain medicine prescriptions from only one health care provider. Pain can be managed with many types of alternative treatments. Ask your health care provider to refer you to one or more specialists who can help you manage pain through: Physical or occupational therapy. Counseling (cognitive behavioral therapy). Good nutrition. Biofeedback. Massage. Meditation. Non-opioid medicine. Following a gentle exercise program. How to use opioid pain medicine Taking medicine Take your pain medicine exactly as told by your health care provider. Take it only when you need it. If your pain gets less severe, you may take less than your prescribed dose if your health care provider approves. If you are not having pain,  do nottake pain medicine unless your health care provider tells you to take it. If your pain is severe, do nottry to treat it yourself by taking more pills than instructed on your prescription. Contact your health care provider for help. Write down the times when you take your pain medicine. It is easy to become confused while on pain medicine. Writing the time can help you avoid overdose. Take other over-the-counter or prescription  medicines only as told by your health care provider. Keeping yourself and others safe  While you are taking opioid pain medicine: Do not drive, use machinery, or power tools. Do not sign legal documents. Do not drink alcohol. Do not take sleeping pills. Do not supervise children by yourself. Do not do activities that require climbing or being in high places. Do not go to a lake, river, ocean, spa, or swimming pool. Do not share your pain medicine with anyone. Keep pain medicine in a locked cabinet or in a secure area where pets and children cannot reach it. Stopping your use of opioids If you have been taking opioid medicine for more than a few weeks, you may need to slowly decrease (taper) how much you take until you stop completely. Tapering your use of opioids can decrease your risk of symptoms of withdrawal, such as: Pain and cramping in the abdomen. Nausea. Sweating. Sleepiness. Restlessness. Uncontrollable shaking (tremors). Cravings for the medicine. Do not attempt to taper your use of opioids on your own. Talk with your health care provider about how to do this. Your health care provider may prescribe a step-down schedule based on how much medicine you are taking and how long you have been taking it. Getting rid of leftover pills Do not save any leftover pills. Get rid of leftover pills safely by: Taking the medicine to a prescription take-back program. This is usually offered by the county or law enforcement. Bringing them to a pharmacy that has a drug disposal container. Flushing them down the toilet. Check the label or package insert of your medicine to see whether this is safe to do. Throwing them out in the trash. Check the label or package insert of your medicine to see whether this is safe to do. If it is safe to throw it out, remove the medicine from the original container, put it into a sealable bag or container, and mix it with used coffee grounds, food scraps, dirt, or  cat litter before putting it in the trash. Follow these instructions at home: Activity Do exercises as told by your health care provider. Avoid activities that make your pain worse. Return to your normal activities as told by your health care provider. Ask your health care provider what activities are safe for you. General instructions You may need to take these actions to prevent or treat constipation: Drink enough fluid to keep your urine pale yellow. Take over-the-counter or prescription medicines. Eat foods that are high in fiber, such as beans, whole grains, and fresh fruits and vegetables. Limit foods that are high in fat and processed sugars, such as fried or sweet foods. Keep all follow-up visits. This is important. Where to find support If you have been taking opioids for a long time, you may benefit from receiving support for quitting from a local support group or counselor. Ask your health care provider for a referral to these resources in your area. Where to find more information Centers for Disease Control and Prevention (CDC): http://www.wolf.info/ U.S. Food and Drug Administration (FDA): [place].ch  Get help right away if: You may have taken too much of an opioid (overdosed). Common symptoms of an overdose: Your breathing is slower or more shallow than normal. You have a very slow heartbeat (pulse). You have slurred speech. You have nausea and vomiting. Your pupils become very small. You have other potential symptoms: You are very confused. You faint or feel like you will faint. You have cold, clammy skin. You have blue lips or fingernails. You have thoughts of harming yourself or harming others. These symptoms may represent a serious problem that is an emergency. Do not wait to see if the symptoms will go away. Get medical help right away. Call your local emergency services (911 in the [place] Do not drive yourself to the hospital.  If you ever feel like you may hurt yourself or  others, or have thoughts about taking your own life, get help right away. Go to your nearest emergency department or: Call your local emergency services (911 in the [place] Call the [facility] (252)083-9088 in the [place] Call a suicide crisis helpline, such as the [place] at 757-245-2050 or 988 in the [place]. This is open 24 hours a day in the [place] Text the Crisis Text Line at 484-420-8697 (in the [place].). Summary Opioid medicines can help you manage moderate to severe pain for a short period of time. A pain treatment plan is an agreement between you and your health care provider. Discuss the goals of your treatment, including how much pain you might expect to have and how you will manage the pain. If you think that you or someone else may have taken too much of an opioid, get medical help right away. This information is not intended to replace advice given to you by your health care provider. Make sure you discuss any questions you have with your health care provider. Document Revised: 02/01/2021 Document Reviewed: 10/19/2020 Elsevier Patient Education  [place] directives: Yes Patient will bring copy  Conditions/risks identified: None  Next appointment: Follow up in one year for your annual wellness visit.   Preventive Care 30 Years and Older, Male Preventive care refers to lifestyle choices and visits with your health care provider that can promote health and wellness. What does preventive care include? A yearly physical exam. This is also called an annual well check. Dental exams once or twice a year. Routine eye exams. Ask your health care provider how often you should have your eyes checked. Personal lifestyle choices, including: Daily care of your teeth and gums. Regular physical activity. Eating a healthy diet. Avoiding tobacco and drug use. Limiting alcohol use. Practicing safe sex. Taking low doses of aspirin  every day. Taking vitamin and mineral supplements as recommended by your health care provider. What happens during an annual well check? The services and screenings done by your health care provider during your annual well check will depend on your age, overall health, lifestyle risk factors, and family history of disease. Counseling  Your health care provider may ask you questions about your: Alcohol use. Tobacco use. Drug use. Emotional well-being. Home and relationship well-being. Sexual activity. Eating habits. History of falls. Memory and ability to understand (cognition). Work and work Statistician. Screening  You may have the following tests or measurements: Height, weight, and BMI. Blood pressure. Lipid and cholesterol levels. These may be checked every 5 years, or more frequently if you are over 39 years old. Skin check. Lung cancer screening. You may  have this screening every year starting at age 27 if you have a 30-pack-year history of smoking and currently smoke or have quit within the past 15 years. Fecal occult blood test (FOBT) of the stool. You may have this test every year starting at age 19. Flexible sigmoidoscopy or colonoscopy. You may have a sigmoidoscopy every 5 years or a colonoscopy every 10 years starting at age 96. Prostate cancer screening. Recommendations will vary depending on your family history and other risks. Hepatitis C blood test. Hepatitis B blood test. Sexually transmitted disease (STD) testing. Diabetes screening. This is done by checking your blood sugar (glucose) after you have not eaten for a while (fasting). You may have this done every 1-3 years. Abdominal aortic aneurysm (AAA) screening. You may need this if you are a current or former smoker. Osteoporosis. You may be screened starting at age 47 if you are at high risk. Talk with your health care provider about your test results, treatment options, and if necessary, the need for more  tests. Vaccines  Your health care provider may recommend certain vaccines, such as: Influenza vaccine. This is recommended every year. Tetanus, diphtheria, and acellular pertussis (Tdap, Td) vaccine. You may need a Td booster every 10 years. Zoster vaccine. You may need this after age 69. Pneumococcal 13-valent conjugate (PCV13) vaccine. One dose is recommended after age 69. Pneumococcal polysaccharide (PPSV23) vaccine. One dose is recommended after age 51. Talk to your health care provider about which screenings and vaccines you need and how often you need them. This information is not intended to replace advice given to you by your health care provider. Make sure you discuss any questions you have with your health care provider. Document Released: 08/05/2015 Document Revised: 03/28/2016 Document Reviewed: 05/10/2015 Elsevier Interactive Patient Education  2017 [place] Prevention in the Home Falls can cause injuries. They can happen to people of all ages. There are many things you can do to make your home safe and to help prevent falls. What can I do on the outside of my home? Regularly fix the edges of walkways and driveways and fix any cracks. Remove anything that might make you trip as you walk through a door, such as a raised step or threshold. Trim any bushes or trees on the path to your home. Use bright outdoor lighting. Clear any walking paths of anything that might make someone trip, such as rocks or tools. Regularly check to see if handrails are loose or broken. Make sure that both sides of any steps have handrails. Any raised decks and porches should have guardrails on the edges. Have any leaves, snow, or ice cleared regularly. Use sand or salt on walking paths during winter. Clean up any spills in your garage right away. This includes oil or grease spills. What can I do in the bathroom? Use night lights. Install grab bars by the toilet and in the tub and shower.  Do not use towel bars as grab bars. Use non-skid mats or decals in the tub or shower. If you need to sit down in the shower, use a plastic, non-slip stool. Keep the floor dry. Clean up any water that spills on the floor as soon as it happens. Remove soap buildup in the tub or shower regularly. Attach bath mats securely with double-sided non-slip rug tape. Do not have throw rugs and other things on the floor that can make you trip. What can I do in the bedroom? Use night lights. Make sure  that you have a light by your bed that is easy to reach. Do not use any sheets or blankets that are too big for your bed. They should not hang down onto the floor. Have a firm chair that has side arms. You can use this for support while you get dressed. Do not have throw rugs and other things on the floor that can make you trip. What can I do in the kitchen? Clean up any spills right away. Avoid walking on wet floors. Keep items that you use a lot in easy-to-reach places. If you need to reach something above you, use a strong step stool that has a grab bar. Keep electrical cords out of the way. Do not use floor polish or wax that makes floors slippery. If you must use wax, use non-skid floor wax. Do not have throw rugs and other things on the floor that can make you trip. What can I do with my stairs? Do not leave any items on the stairs. Make sure that there are handrails on both sides of the stairs and use them. Fix handrails that are broken or loose. Make sure that handrails are as long as the stairways. Check any carpeting to make sure that it is firmly attached to the stairs. Fix any carpet that is loose or worn. Avoid having throw rugs at the top or bottom of the stairs. If you do have throw rugs, attach them to the floor with carpet tape. Make sure that you have a light switch at the top of the stairs and the bottom of the stairs. If you do not have them, ask someone to add them for you. What else  can I do to help prevent falls? Wear shoes that: Do not have high heels. Have rubber bottoms. Are comfortable and fit you well. Are closed at the toe. Do not wear sandals. If you use a stepladder: Make sure that it is fully opened. Do not climb a closed stepladder. Make sure that both sides of the stepladder are locked into place. Ask someone to hold it for you, if possible. Clearly mark and make sure that you can see: Any grab bars or handrails. First and last steps. Where the edge of each step is. Use tools that help you move around (mobility aids) if they are needed. These include: Canes. Walkers. Scooters. Crutches. Turn on the lights when you go into a dark area. Replace any light bulbs as soon as they burn out. Set up your furniture so you have a clear path. Avoid moving your furniture around. If any of your floors are uneven, fix them. If there are any pets around you, be aware of where they are. Review your medicines with your doctor. Some medicines can make you feel dizzy. This can increase your chance of falling. Ask your doctor what other things that you can do to help prevent falls. This information is not intended to replace advice given to you by your health care provider. Make sure you discuss any questions you have with your health care provider. Document Released: 05/05/2009 Document Revised: 12/15/2015 Document Reviewed: 08/13/2014 Elsevier Interactive Patient Education  2017 [employer].

## 2021-08-29 NOTE — Progress Notes (Signed)
° °  Subjective:    Patient ID: [name], male    DOB: October 07, 1940, 81 y.o.   MRN: [id number]  HPI Here for pain management. His back pain is about the same as usual. He does mention having more angina pains lately than usual. This often occurs when he walks to his mailbox and then comes back up the hill to his house. He often takes a SL NTG, and this always makes the pain stop.    Review of Systems  Constitutional: Negative.   Respiratory: Negative.    Cardiovascular:  Positive for chest pain. Negative for palpitations and leg swelling.  Musculoskeletal:  Positive for back pain.      Objective:   Physical Exam Constitutional:      General: He is not in acute distress.    Appearance: Normal appearance.  Cardiovascular:     Rate and Rhythm: Normal rate and regular rhythm.     Pulses: Normal pulses.     Heart sounds: Normal heart sounds.  Pulmonary:     Effort: Pulmonary effort is normal.     Breath sounds: Normal breath sounds.  Musculoskeletal:     Comments: Low back is tender with reduced ROM   Neurological:     Mental Status: He is alert.          Assessment & Plan:  Pain management. Indication for chronic opioid: low back pain Medication and dose: Oxycodone 30 mg  # pills per month: 120 Last UDS date: 08-29-21 Opioid Treatment Agreement signed (Y/N): 08-26-17 Opioid Treatment Agreement last reviewed with patient:  08-29-21 [facility] reviewed this encounter (include red flags):   We will obtain a UDS today. His Oxycodone was refilled. As for the angina, he will start taking Imdur 60 mg daily. He will see Cardiology again next month.  [name], MD

## 2021-08-30 ENCOUNTER — Ambulatory Visit: Payer: Medicare Other | Admitting: Family Medicine

## 2021-08-31 LAB — DM TEMPLATE

## 2021-08-31 LAB — DRUG MONITOR, PANEL 1, W/CONF, URINE
Amphetamines: NEGATIVE ng/mL (ref <500)
Barbiturates: NEGATIVE ng/mL (ref <300)
Benzodiazepines: NEGATIVE ng/mL (ref <100)
Cocaine Metabolite: NEGATIVE ng/mL (ref <150)
Codeine: NEGATIVE ng/mL (ref <50)
Creatinine: 140.8 mg/dL (ref >=20.0)
Hydrocodone: NEGATIVE ng/mL (ref <50)
Hydromorphone: NEGATIVE ng/mL (ref <50)
Marijuana Metabolite: NEGATIVE ng/mL (ref <20)
Methadone Metabolite: NEGATIVE ng/mL (ref <100)
Morphine: NEGATIVE ng/mL (ref <50)
Norhydrocodone: NEGATIVE ng/mL (ref <50)
Noroxycodone: 2944 ng/mL — ABNORMAL HIGH (ref <50)
Opiates: NEGATIVE ng/mL (ref <100)
Oxidant: NEGATIVE ug/mL (ref <200)
Oxycodone: 3311 ng/mL — ABNORMAL HIGH (ref <50)
Oxycodone: POSITIVE ng/mL — AB (ref <100)
Oxymorphone: 779 ng/mL — ABNORMAL HIGH (ref <50)
Phencyclidine: NEGATIVE ng/mL (ref <25)
pH: 5.4 (ref 4.5–9.0)

## 2021-09-01 ENCOUNTER — Telehealth: Payer: Self-pay | Admitting: Cardiovascular Disease

## 2021-09-01 NOTE — Telephone Encounter (Signed)
Call sent straight to triage. Patient stated he has been having continuous chest pain for the past 2 weeks and he is not seeing any relief with nitro. Informed patient that per our policy that if patient is having active chest pain they need to call 911 or go to the ED. Patient is refusing to go to the ED, due to not wanting to wait. Informed patient that he needs to get evaluated right away to make sure this is heart related or not. Informed patient that if it is due to a heart attack that he will not be waiting long. Patient stated he might go to the ED in [place], let patient know if this chest pain is due to his heart and he is at [facility] ED, they will transfer him to [facility]. Patient stated he would not let them and he would have someone else take him if that is the case.

## 2021-09-01 NOTE — Telephone Encounter (Signed)
New Message:    Patient calling to say he have Angina so bad he can walk or talk.    Pt c/o of Chest Pain: STAT if CP now or developed within 24 hours  1. Are you having CP right now? yes  2. Are you experiencing any other symptoms (ex. SOB, nausea, vomiting, sweating)? yes  3. How long have you been experiencing CP? 2 weeks 4. Is your CP continuous or coming and going?    5. Have you taken Nitroglycerin? Not today ?

## 2021-09-04 NOTE — Telephone Encounter (Addendum)
Patient did not go to the ER last week.  He continues to think this is a gas bubble that goes up into his chest. He drank coca cola and he was belching a lot and then the pain resolved.  This happened this afternoon after eating a [employer] sausage egg biscuit.  He said Dr. Sarajane Jews thinks its angina and gave him ntg.  He took the ntg and it also worked.     Tomorrow he has an appointment with the [facility].  I offered to move his appointment with Dr. [name] up but for now he is still wanted to wait until March with APP.  Adv to call if has any further needs.    Dr. [name] aware. No other recommendations.

## 2021-09-06 ENCOUNTER — Telehealth: Payer: Self-pay | Admitting: Pharmacist

## 2021-09-06 NOTE — Chronic Care Management (AMB) (Signed)
° ° °  Chronic Care Management Pharmacy Assistant   Name: DIXIE JAFRI  MRN: [id number] DOB: 1940/10/01  Reason for Encounter: Follow up Gabapentin dose increase.  Patient was to increase Gabapentin to 3 capsules at bedtime to help with his restless leg symptoms.   Spoke with patient, he has only been taking taking 2 capsules at bedtime, he wasn't aware he was to take 3 at bedtime, he says 2 at bedtime is helping to calm his restless leg enough that he can get some sleep.  He states he was able to sleep 5 hours last night.   Patient was advised to go ahead and increase Gabapentin to 3 capsules at bedtime as instructed by [name], clinical pharmacist and we will check back in with him in a few weeks to see if this is helping.     Care Gaps: AWV - completed 08/29/2021 Last BP - 124/76 on 08/29/2021 Last A1C- 6.7 on 06/26/2022 Malb - never done Hep C screen - never done  Star Rating Drugs: None  [place] Pharmacist Assistant [phone]

## 2021-09-21 DIAGNOSIS — B3749 Other urogenital candidiasis: Secondary | ICD-10-CM | POA: Diagnosis not present

## 2021-09-27 ENCOUNTER — Ambulatory Visit (INDEPENDENT_AMBULATORY_CARE_PROVIDER_SITE_OTHER): Payer: Medicare HMO

## 2021-09-27 DIAGNOSIS — I495 Sick sinus syndrome: Secondary | ICD-10-CM

## 2021-09-28 DIAGNOSIS — B0052 Herpesviral keratitis: Secondary | ICD-10-CM | POA: Diagnosis not present

## 2021-09-28 LAB — CUP PACEART REMOTE DEVICE CHECK
Battery Remaining Longevity: 110 mo
Battery Voltage: 2.98 V
Brady Statistic AP VP Percent: 19.61 %
Brady Statistic AP VS Percent: 15.48 %
Brady Statistic AS VP Percent: 26.57 %
Brady Statistic AS VS Percent: 38.34 %
Brady Statistic RA Percent Paced: 34.4 %
Brady Statistic RV Percent Paced: 46.33 %
Date Time Interrogation Session: 20230308193726
Implantable Lead Implant Date: 20180604
Implantable Lead Implant Date: 20180604
Implantable Lead Location: 753859
Implantable Lead Location: 753860
Implantable Lead Model: 5076
Implantable Lead Model: 5076
Implantable Pulse Generator Implant Date: 20180604
Lead Channel Impedance Value: 304 Ohm
Lead Channel Impedance Value: 361 Ohm
Lead Channel Impedance Value: 418 Ohm
Lead Channel Impedance Value: 418 Ohm
Lead Channel Pacing Threshold Amplitude: 0.625 V
Lead Channel Pacing Threshold Amplitude: 0.875 V
Lead Channel Pacing Threshold Pulse Width: 0.4 ms
Lead Channel Pacing Threshold Pulse Width: 0.4 ms
Lead Channel Sensing Intrinsic Amplitude: 3.25 mV
Lead Channel Sensing Intrinsic Amplitude: 3.25 mV
Lead Channel Sensing Intrinsic Amplitude: 5.375 mV
Lead Channel Sensing Intrinsic Amplitude: 5.375 mV
Lead Channel Setting Pacing Amplitude: 1.5 V
Lead Channel Setting Pacing Amplitude: 2.5 V
Lead Channel Setting Pacing Pulse Width: 0.4 ms
Lead Channel Setting Sensing Sensitivity: 1.2 mV

## 2021-10-02 DIAGNOSIS — B0052 Herpesviral keratitis: Secondary | ICD-10-CM | POA: Diagnosis not present

## 2021-10-03 ENCOUNTER — Encounter: Payer: Self-pay | Admitting: Family

## 2021-10-03 ENCOUNTER — Telehealth: Payer: Medicare HMO | Admitting: Family Medicine

## 2021-10-03 DIAGNOSIS — R69 Illness, unspecified: Secondary | ICD-10-CM | POA: Diagnosis not present

## 2021-10-03 NOTE — Telephone Encounter (Signed)
Pt is calling and needs PA for oxycodone (ROXICODONE) 30 MG immediate release tablet pt said please call his insurance co [employer] ?

## 2021-10-04 ENCOUNTER — Telehealth: Payer: Self-pay | Admitting: Pharmacist

## 2021-10-04 NOTE — Chronic Care Management (AMB) (Addendum)
? ? ?  Chronic Care Management ?Pharmacy Assistant  ? ?Name: SHAHRUKH PASCH  MRN: [id number] DOB: 1941/04/22 ? ?Reason for Encounter: Follow up Gabapentin dose increase.  ? ?Spoke with patient on 09/06/2021, patient was to increase Gabapentin to 3 capsules at bedtime to see if this was helping to calm his restless leg enough that he could get some sleep.  ?Spoke with patient today, he states he is taking Gabapentin 100 mg 1 in the morning and 2 at bedtime.  Patient states this is working well, he is getting anywhere from 4-7 hours of solid sleep every night.  ? ? ?Care Gaps: ?AWV - completed 08/29/2021 ?Last BP - 124/76 on 08/29/2021 ?Last A1C- 6.7 on 06/26/2022 ?Malb - never done ?Hep C screen - never done ?Covid vaccine - never done ?  ?Star Rating Drugs: ?None ? ?Gennie Alma CMA  ?Clinical Pharmacist Assistant ?[phone] ? ?

## 2021-10-05 ENCOUNTER — Telehealth: Payer: Self-pay | Admitting: Pharmacist

## 2021-10-05 NOTE — Chronic Care Management (AMB) (Addendum)
? ? ?Chronic Care Management ?Pharmacy Assistant  ? ?Name: [name]  MRN: [id number] DOB: Nov 27, 1940 ? ?Reason for Encounter: Medication Review / Medication Coordination Call  ?  ?Conditions to be addressed/monitored: ?HTN ? ? ?Recent office visits:  ?None ? ?Recent consult visits:  ?None ? ?Hospital visits:  ?None ? ?Medications: ?Outpatient Encounter Medications as of 10/05/2021  ?Medication Sig  ? apixaban (ELIQUIS) 5 MG TABS tablet Take 1 tablet (5 mg total) by mouth 2 (two) times daily.  ? Ascorbic Acid (VITAMIN C PO) Take 4 tablets by mouth daily as needed (flu like symptopms).   ? clotrimazole (LOTRIMIN) 1 % cream Apply 1 application topically daily.  ? diclofenac sodium (VOLTAREN) 1 % GEL Apply 1 application topically daily as needed (ankle pain).  ? diltiazem (CARDIZEM CD) 120 MG 24 hr capsule TAKE 1 CAPSULE BY MOUTH EVERY DAY  ? fluticasone (FLONASE) 50 MCG/ACT nasal spray Place 1 spray into both nostrils daily as needed for allergies or rhinitis.  ? gabapentin (NEURONTIN) 100 MG capsule TAKE 1 CAPSULE BY MOUTH THREE TIMES A DAY  ? isosorbide mononitrate (IMDUR) 60 MG 24 hr tablet Take 1 tablet (60 mg total) by mouth daily.  ? ketoconazole (NIZORAL) 2 % cream Apply 1 application topically 2 (two) times daily as needed for irritation.  ? levothyroxine (SYNTHROID) 200 MCG tablet Take 1 tablet (200 mcg total) by mouth daily.  ? magnesium oxide (MAG-OX) 400 MG tablet Take 400 mg by mouth daily as needed (restless leg).  ? nitroGLYCERIN (NITROSTAT) 0.4 MG SL tablet Place 1 tablet (0.4 mg total) under the tongue every 5 (five) minutes as needed for chest pain.  ? omeprazole (PRILOSEC) 40 MG capsule Take 1 capsule (40 mg total) by mouth daily.  ? [START ON 11/01/2021] oxycodone (ROXICODONE) 30 MG immediate release tablet Take 1 tablet (30 mg total) by mouth every 6 (six) hours as needed for pain.  ? potassium chloride SA (KLOR-CON M) 20 MEQ tablet Take 1 tablet (20 mEq total) by mouth daily.  ? ropinirole  (REQUIP) 5 MG tablet Take 1 tablet (5 mg total) by mouth in the morning, at noon, and at bedtime.  ? torsemide (DEMADEX) 20 MG tablet Take 1 tablet (20 mg total) by mouth 2 (two) times daily.  ? vitamin B-12 (CYANOCOBALAMIN) 100 MCG tablet Take 100 mcg by mouth daily.  ? ?No facility-administered encounter medications on file as of 10/05/2021.  ? ?Reviewed chart for medication changes ahead of medication coordination call. ? ?No OVs, Consults, or hospital visits since last care coordination call/Pharmacist visit. (If appropriate, list visit date, provider name) ? ?No medication changes indicated OR if recent visit, treatment plan here. ? ?BP Readings from Last 3 Encounters:  ?08/29/21 124/76  ?08/16/21 110/76  ?08/07/21 110/60  ?  ?Lab Results  ?Component Value Date  ? HGBA1C 6.7 (H) 06/26/2021  ?  ? ?Patient obtains medications through Adherence Packaging  30 Days  ? ?Last adherence delivery included: Onboarding ? ?Patient declined last month: No medications declined ? ? ?Patient is due for next adherence delivery on: 10/16/2021 ? ?Called patient and reviewed medications and coordinated delivery. ? ?This delivery to include: ?Diltiazem 120 mg - 1 tablet at breakfast ?Gabapentin 100 mg - 1 tablet at breakfast and 2 at bedtime (confirmed with patient this is how he is taking) ?Vitamin B12 500 mcg - 1 tablet at breakfast and bedtime ?Omeprazole 40 mg - 1 tablet at breakfast ?Klor-con 20 meq - 1 tablet at breakfast ?  Torsemide 20 mg - 1 tablet at breakfast ?Levothyroxine 200 mcg - 1 tablet before breakfast ? ?Patient will need a short fill: No short fill needed ? ?Coordinated acute fill: No acute fill needed ? ?Patient declined the following medications: Patient did not decline any medications  ? ?Confirmed delivery date of 10/13/2021 per patient request, advised patient that pharmacy will contact them the morning of delivery. ? ?Care Gaps: ?AWV - completed 08/29/2021 ?Last BP - 124/76 on 08/29/2021 ?Last A1C- 6.7 on  06/26/2022 ?Malb - never done ?Hep C screen - never done ?Covid vaccine - never done ?  ?Star Rating Drugs: ?None ? ?Gennie Alma CMA  ?Clinical Pharmacist Assistant ?[phone] ? ?

## 2021-10-05 NOTE — Telephone Encounter (Addendum)
Attempted to complete PA for patient.   Called patient to get updated insurance information from insurance card.     Will complete in a.m ?

## 2021-10-05 NOTE — Telephone Encounter (Signed)
Patient is calling in to check the status of his PA. Patient stated that they only gave him enough for today and he will be going out of town tomorrow so he needs this done as soon as possible. ? ?Please advise. ?

## 2021-10-06 NOTE — Telephone Encounter (Addendum)
Spoke with patient about prior authorization.   Information below ? ?Prior authorization for Oxycodone '30mg'$  complete. ? ?Your information has been submitted to [facility]. [employer] will review the request and will issue a decision, typically within 3-7 days from your submission. You can check the updated outcome later by reopening this request. ? ?If [employer] has not responded in 3-7 days or if you have any questions about your ePA request, please contact [employer] at [phone]. If you think there may be a problem with your PA request, use our live chat feature at the bottom right. ?

## 2021-10-09 ENCOUNTER — Telehealth: Payer: Self-pay

## 2021-10-09 NOTE — Telephone Encounter (Signed)
Prior authorization for Oxycodone '30mg'$ . ?Approved on March 17 ?PA Case: [id number], Status: Approved, Coverage Starts on: 07/23/2021 12:00:00 AM, Coverage Ends on: 07/22/2022 12:00:00 AM. Questions? Contact [phone]. ?

## 2021-10-09 NOTE — Progress Notes (Signed)
? ? ?Office Visit  ?  ?Patient Name: [name] ?Date of Encounter: 10/09/2021 ? ?Primary Care Provider:  [name], MD ?Primary Cardiologist:  [name], MD ?Primary Electrophysiologist: Virl Axe, MD ?Chief Complaint  ?  ?[name] is a 81 y.o. male presents today for follow-up ? ?History of Present Illness  ?  ?[name] is a 81 y.o. male with PMH of atrial flutter, sinus node dysfunction s/p PPM 2018 , HTN, HLD, hypothyroidism, angiodysplasia, anemia. Unstable stable angina CAD s/p (PCI with Xience DES to LAD/RCA 2016).  Echo completed 11/12 with normal LV function and normal wall motion, chest pain 2/14 underwent cath with normal coronaries and mild nonobstructive CAD.  Zio patch monitor on 6/16 for dizziness and palpitations.  On 5/17 called with complaint of palpitations and Holter monitor  revealed 4 pauses greater than 3 seconds and one 5.25-second pause mainly nocturnal.  PCP evaluated dizziness discovering hemolytic anemia with hemoglobin 8.5 he received 2 units PRBC and Plavix and aspirin discontinued.  Sleep study was arranged 8/17 but patient was unable to complete.  Echo completed 2017 with normal LV systolic function, grade 1 DD, moderate LVH.Patient seen in office on 5/18 due to complaints of dizziness and palpitations.  Cardiac monitor ordered which showed atrial fib/flutter and long pauses. Xarelto was started. Permanent pacemaker was placed 6/18.  He was seen in follow-up on 6/19 with complaints of dry cough and chest pain, admitted for ischemic work up.  Echo 01/08/17 with normal LV size and function and no valve issues. Nuclear stress test was then performed with a large apical/anterior apical and inferior apical scar but no ischemia. Bilateral disease revealed with carotid dopplers. Echo and nuclear stress completed 7/20 for atypical chest revealed no ischemia and Echo with EF 60-65%. ? ?[name] was last seen by Dr. [name] on 2/22.  He was found to be  statin intolerant and was placed on Zetia however he stopped the Zetia and declines further work-up at lipid clinic.  He refuses to take beta-blockers and also refuses Eliquis due to cost.  Explained the CVA risk with atrial fibs/flutter, was offered option of atrial flutter ablation if recurrence.  On 5/31 was seen in the ED with complaint of dyspnea and orthopnea, had stopped Lasix because it made him urinate.  He was treated with IV Lasix and felt better, was also seen 6/22 in ED after complaint of reaction with hives to p.o. Lasix, was started on Aldactone.  He was then switched to torsemide 20 mg. ? ?Seen on 12/22 by Tommye Standard, P.A. he was last seen by Dr. [name] in 2018.  During that visit he had complaints of occasional chest pain, but endorses it as not being new.  He was on Eliquis at that time.  Today [name] presents for his annual follow-up.  He endorses fatigue daily and shortness of breath with activity, this is his current baseline.  He is euvolemic on examination however he does endorse 3 pillow orthopnea.  He is currently not taking his scheduled torsemide because he states it makes him urinate.  He is able to complete ADLs without any difficulty.  He just completed a recent dental procedure with no complications, and has resumed his Eliquis since the procedure.  He denies any blood in his urine or stools. ? ? ?Sincelast being seen in our clinic [name] reports feeling tired all the time.  Today [name] presents for his annual follow-up.  He endorses  fatigue daily and shortness of breath with activity, this is his current baseline.  He states that he has an occasional feeling of chest pain that is relieved with a swallow of Coca-Cola.  He is euvolemic on examination however he does endorse 3 pillow orthopnea.  He is currently not taking his scheduled torsemide because he states it makes him urinate.  He is able to complete ADLs without any difficulty.  He just completed a recent dental  procedure with no complications, and has resumed his Eliquis since the procedure.  He denies any blood in his urine or stools.He denies chest pain, palpitations, dyspnea, PND, orthopnea, nausea, vomiting, dizziness, syncope, edema, weight gain, or early satiety. ? ?Past Medical History  ?  ?Past Medical History:  ?Diagnosis Date  ? Adenomatous polyp of colon 2007  ? Arthritis   ? Atrial flutter ([place])   ? AVM (arteriovenous malformation) 2011  ? a. S/p argon plasma coagulation and ablation in 2011, 2017.  ? Bradycardia   ? a. H/o almost 7sec pause nocturnally during 2011 admission. Also has h/o fatigue with BB.  ? CAD (coronary artery disease)   ? a. Nonobst disease by cath 2009. b. s/p PCI 10/2014 with DES to [place], patent by relook 11/2014 (Brilinta changed to Plavix with improved sx).  ? Chronic diastolic CHF (congestive heart failure) ([place])   ? Depression   ? Diverticulosis   ? Gastritis 2011  ? GERD (gastroesophageal reflux disease)   ? History of hiatal hernia   ? Hyperlipidemia   ? Hypertension   ? Hypothyroidism   ? Myocardial infarction [facility])   ? Obstructive sleep apnea   ? -no cpap use  ? Pre-diabetes   ? Premature atrial contractions Holter 2016  ? Presence of permanent cardiac pacemaker   ? PVC's (premature ventricular contractions) Holter 2016  ? Statin intolerance   ? Transfusion history   ? several years ago -GI bleed  ? ?Past Surgical History:  ?Procedure Laterality Date  ? ANKLE SURGERY Left   ? APPENDECTOMY    ? CARDIAC CATHETERIZATION N/A 11/26/2014  ? Procedure: Left Heart Cath and Coronary Angiography;  Surgeon: Burnell Blanks, MD;  Location: [facility];  Service: Cardiovascular;  Laterality: N/A;  ? CHOLECYSTECTOMY N/A 08/23/2016  ? Procedure: LAPAROSCOPIC CHOLECYSTECTOMY;  Surgeon: Coralie Keens, MD;  Location: [place];  Service: General;  Laterality: N/A;  ? COLONOSCOPY  08-23-05  ? per Dr. Deatra Ina, adenomatous polyps, repeat in 5 yrs   ? COLONOSCOPY WITH PROPOFOL N/A 12/06/2015   ? Procedure: COLONOSCOPY WITH PROPOFOL;  Surgeon: Doran Stabler, MD;  Location: [facility];  Service: Gastroenterology;  Laterality: N/A;  ? ENTEROSCOPY N/A 12/06/2015  ? Procedure: ENTEROSCOPY;  Surgeon: Doran Stabler, MD;  Location: [facility];  Service: Gastroenterology;  Laterality: N/A;  ? ESOPHAGOGASTRODUODENOSCOPY  08-23-05  ? per Dr. Deatra Ina, cauterized jejunal AVMs   ? HERNIA REPAIR    ? HOT HEMOSTASIS N/A 12/06/2015  ? Procedure: HOT HEMOSTASIS (ARGON PLASMA COAGULATION/BICAP);  Surgeon: Doran Stabler, MD;  Location: [facility];  Service: Gastroenterology;  Laterality: N/A;  ? INGUINAL HERNIA REPAIR Left 06/29/2021  ? Procedure: LAPAROSCOPIC LEFT INGUINAL HERNIA REPAIR WITH MESH;  Surgeon: Ralene Ok, MD;  Location: [place];  Service: General;  Laterality: Left;  ? LEFT HEART CATHETERIZATION WITH CORONARY ANGIOGRAM N/A 09/08/2012  ? Procedure: LEFT HEART CATHETERIZATION WITH CORONARY ANGIOGRAM;  Surgeon: Burnell Blanks, MD;  Location: [facility];  Service:  Cardiovascular;  Laterality: N/A;  ? LEFT HEART CATHETERIZATION WITH CORONARY ANGIOGRAM N/A 11/16/2014  ? Procedure: LEFT HEART CATHETERIZATION WITH CORONARY ANGIOGRAM;  Surgeon: Leonie Man, MD;  Location: [facility] C[facility] LAB;  Service: Cardiovascular;  Laterality: N/A;  ? PACEMAKER IMPLANT N/A 12/24/2016  ? Procedure: Pacemaker Implant;  Surgeon: Deboraha Sprang, MD;  Location: [place];  Service: Cardiovascular;  Laterality: N/A;  ? SKIN GRAFT Right   ? leg  ? TONSILLECTOMY    ? UMBILICAL HERNIA REPAIR N/A 06/29/2021  ? Procedure: OPEN UMBILICAL HERNIA REPAIR WITH MESH;  Surgeon: Ralene Ok, MD;  Location: [place];  Service: General;  Laterality: N/A;  ? ? ?Allergies ? ?Allergies  ?Allergen Reactions  ? Brilinta [Ticagrelor] Shortness Of Breath  ? Colchicine   ?  Syncope-causes patient to pass out  ? Metoprolol Tartrate Other (See Comments)  ?  Severe chest pains " flat lined patient"  ? Mirapex [Pramipexole  Dihydrochloride]   ?  Severe leg pain  ? Shellfish Allergy Anaphylaxis and Hives  ? Statins Other (See Comments)  ?  All statins cause myalgias ?  ? Zolpidem Other (See Comments)  ?  Chest pain  ? Coconut Oil Hives

## 2021-10-10 ENCOUNTER — Other Ambulatory Visit: Payer: Self-pay

## 2021-10-10 ENCOUNTER — Encounter: Payer: Self-pay | Admitting: Nurse Practitioner

## 2021-10-10 ENCOUNTER — Encounter: Payer: Self-pay | Admitting: Family

## 2021-10-10 ENCOUNTER — Ambulatory Visit: Payer: Medicare HMO | Admitting: Nurse Practitioner

## 2021-10-10 VITALS — BP 106/64 | HR 61 | Ht 69.0 in | Wt 228.0 lb

## 2021-10-10 DIAGNOSIS — I2510 Atherosclerotic heart disease of native coronary artery without angina pectoris: Secondary | ICD-10-CM | POA: Diagnosis not present

## 2021-10-10 DIAGNOSIS — E785 Hyperlipidemia, unspecified: Secondary | ICD-10-CM | POA: Diagnosis not present

## 2021-10-10 DIAGNOSIS — I10 Essential (primary) hypertension: Secondary | ICD-10-CM | POA: Diagnosis not present

## 2021-10-10 DIAGNOSIS — I5032 Chronic diastolic (congestive) heart failure: Secondary | ICD-10-CM

## 2021-10-10 MED ORDER — TORSEMIDE 20 MG PO TABS: ORAL_TABLET | ORAL | 3 refills | Status: DC

## 2021-10-10 MED ORDER — LEVOTHYROXINE SODIUM 200 MCG PO TABS: 200.0000 ug | ORAL_TABLET | Freq: Every day | ORAL | 1 refills | Status: DC

## 2021-10-10 NOTE — Patient Instructions (Addendum)
Medication Instructions:  ?Your physician has recommended you make the following change in your medication:  ? TAKE 2 Torsemides daily for 3 days then REDUCE it to 1 tablet only on MONDAYS, WEDNESDAYS, & FRIDAYS.  If you notice increased weight gain / shortness of breath, PLEASE let us know. ? ?*If you need a refill on your cardiac medications before your next appointment, please call your pharmacy* ? ? ?Lab Work: ?None ordered ? ?If you have labs (blood work) drawn today and your tests are completely normal, you will receive your results only by: ?MyChart Message (if you have MyChart) OR ?A paper copy in the mail ?If you have any lab test that is abnormal or we need to change your treatment, we will call you to review the results. ? ? ?Testing/Procedures: ?None ordered ? ? ?Follow-Up: ?At [facility], you and your health needs are our priority.  As part of our continuing mission to provide you with exceptional heart care, we have created designated Provider Care Teams.  These Care Teams include your primary Cardiologist (physician) and Advanced Practice Providers (APPs -  Physician Assistants and Nurse Practitioners) who all work together to provide you with the care you need, when you need it. ? ?We recommend signing up for the patient portal called "MyChart".  Sign up information is provided on this After Visit Summary.  MyChart is used to connect with patients for Virtual Visits (Telemedicine).  Patients are able to view lab/test results, encounter notes, upcoming appointments, etc.  Non-urgent messages can be sent to your provider as well.   ?To learn more about what you can do with MyChart, go to [email or web address].   ? ?Your next appointment:   ?12 month(s) ? ?The format for your next appointment:   ?In Person ? ?Provider:   ?[name], MD  ? ? ?Other Instructions ? ?

## 2021-10-10 NOTE — Progress Notes (Signed)
Remote pacemaker transmission.   

## 2021-10-13 ENCOUNTER — Telehealth: Payer: Self-pay | Admitting: Family Medicine

## 2021-10-13 ENCOUNTER — Ambulatory Visit (INDEPENDENT_AMBULATORY_CARE_PROVIDER_SITE_OTHER): Payer: Medicare Other

## 2021-10-13 DIAGNOSIS — E785 Hyperlipidemia, unspecified: Secondary | ICD-10-CM

## 2021-10-13 DIAGNOSIS — I2510 Atherosclerotic heart disease of native coronary artery without angina pectoris: Secondary | ICD-10-CM

## 2021-10-13 DIAGNOSIS — M5440 Lumbago with sciatica, unspecified side: Secondary | ICD-10-CM

## 2021-10-13 DIAGNOSIS — I10 Essential (primary) hypertension: Secondary | ICD-10-CM

## 2021-10-13 DIAGNOSIS — I4892 Unspecified atrial flutter: Secondary | ICD-10-CM

## 2021-10-13 DIAGNOSIS — G2581 Restless legs syndrome: Secondary | ICD-10-CM

## 2021-10-13 MED ORDER — GABAPENTIN 100 MG PO CAPS: ORAL_CAPSULE | ORAL | 5 refills | Status: DC

## 2021-10-13 NOTE — Patient Instructions (Signed)
Visit Information ? ?Thank you for taking time to visit with me today. Please don't hesitate to contact me if I can be of assistance to you before our next scheduled telephone appointment. ? ?Following are the goals we discussed today:  ?Take all medications as prescribed ?Attend all scheduled provider appointments ?Call pharmacy for medication refills 3-7 days in advance of running out of medications ?Perform all self care activities independently  ?Perform IADL's (shopping, preparing meals, housekeeping, managing finances) independently ?Call provider office for new concerns or questions  ?call office if I gain more than 2 pounds in one day or 5 pounds in one week ?keep legs up while sitting ?watch for swelling in feet, ankles and legs every day ?weigh myself daily ?follow rescue plan if symptoms flare-up ?track symptoms and what helps feel better or worse ?check pulse (heart) rate once a day ?make a plan to eat healthy ?take medicine as prescribed ?check blood pressure daily ?choose a place to take my blood pressure (home, clinic or office, retail store) ?take blood pressure log to all doctor appointments ?call doctor for signs and symptoms of high blood pressure ?take medications for blood pressure exactly as prescribed ?eat more whole grains, fruits and vegetables, lean meats and healthy fats ?limit salt intake to '2300mg'$ /day ?develop an exercise routine ?learn relaxation techniques ?- practice acceptance of chronic pain ?- practice relaxation or meditation daily ?- tell myself I can (not I can't) ?- think of new ways to do favorite things ?- use distraction techniques ?- use relaxation during pain ?It is important to avoid accidents which may result in broken bones.  Here are a few ideas on how to make your home safer so you will be less likely to trip or fall. ? ?Use nonskid mats or non slip strips in your shower or tub, on your bathroom floor and around sinks.  If you know that you have spilled water, wipe it  up! ?In the bathroom, it is important to have properly installed grab bars on the walls or on the edge of the tub.  Towel racks are NOT strong enough for you to hold onto or to pull on for support. ?Stairs and hallways should have enough light.  Add lamps or night lights if you need ore light. ?It is good to have handrails on both sides of the stairs if possible.  Always fix broken handrails right away. ?It is important to see the edges of steps.  Paint the edges of outdoor steps white so you can see them better.  Put colored tape on the edge of inside steps. ?Throw-rugs are dangerous because they can slide.  Removing the rugs is the best idea, but if they must stay, add adhesive carpet tape to prevent slipping. ?Do not keep things on stairs or in the halls.  Remove small furniture that blocks the halls as it may cause you to trip.  Keep telephone and electrical cords out of the way where you walk. ?Always were sturdy, rubber-soled shoes for good support.  Never wear just socks, especially on the stairs.  Socks may cause you to slip or fall.  Do not wear full-length housecoats as you can easily trip on the bottom.  ?Place the things you use the most on the shelves that are the easiest to reach.  If you use a stepstool, make sure it is in good condition.  If you feel unsteady, DO NOT climb, ask for help. ?If a health professional advises you to use a  cane or walker, do not be ashamed.  These items can keep you from falling and breaking your bones. ?Our next appointment is by telephone on 12/14/21 at 9 AM ? ?Please call the care guide team at [phone] if you need to cancel or reschedule your appointment.  ? ?If you are experiencing a Mental Health or [place] or need someone to talk to, please call the Suicide and Crisis Lifeline: 988 ?call the [place] National Suicide Prevention Lifeline: 984-877-2533 or TTY: 857-302-6099 TTY 832-698-5374) to talk to a trained counselor ?call 1-800-273-TALK (toll  free, 24 hour hotline) ?call 911  ? ?Patient verbalizes understanding of instructions and care plan provided today and agrees to view in [place]. Active MyChart status confirmed with patient.   ? ?Peter Garter RN, BSN,CCM, CDE ?Care Management Coordinator ?[facility] ?[phone]   ?
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
## 2021-10-13 NOTE — Telephone Encounter (Signed)
Done

## 2021-10-20 DIAGNOSIS — I2510 Atherosclerotic heart disease of native coronary artery without angina pectoris: Secondary | ICD-10-CM

## 2021-10-20 DIAGNOSIS — I10 Essential (primary) hypertension: Secondary | ICD-10-CM

## 2021-10-20 DIAGNOSIS — I4891 Unspecified atrial fibrillation: Secondary | ICD-10-CM

## 2021-10-20 DIAGNOSIS — E785 Hyperlipidemia, unspecified: Secondary | ICD-10-CM

## 2021-11-02 ENCOUNTER — Other Ambulatory Visit: Payer: Self-pay | Admitting: Cardiovascular Disease

## 2021-11-02 ENCOUNTER — Telehealth: Payer: Self-pay | Admitting: Family Medicine

## 2021-11-02 NOTE — Telephone Encounter (Signed)
Pt was told by pharmacy that he could not pick up his oxycodone until 11/09/21. Prescription states Earliest Fill Date: 11/01/2021, Maudie Mercury at [id number] [place], [place]- [address]  ?[address] [place] [address] ?Phone:  [phone] Fax:  [phone] ?

## 2021-11-02 NOTE — Telephone Encounter (Signed)
Called [facility] spoke with pharmacist tech.   Reviewed prescription for Oxycodone '30mg'$  to be filled on 11/01/2021 to be correct.    Prescription given to pharmacist to be filled.   Called patient to make aware.   ?

## 2021-11-03 ENCOUNTER — Telehealth: Payer: Self-pay | Admitting: Pharmacist

## 2021-11-03 NOTE — Chronic Care Management (AMB) (Addendum)
? ? ?Chronic Care Management ?Pharmacy Assistant  ? ?Name: [name]  MRN: [id number] DOB: 08-11-1940 ? ?Reason for Encounter: Disease State and Medication Review / Hypertension and Medication Coordination Call ?  ?Conditions to be addressed/monitored: ?HTN ? ?Recent office visits:  ?None ? ?Recent consult visits:  ?10/10/2021 Ambrose Pancoast NP [facility]) - Patient was seen for Coronary artery disease involving native coronary artery of native heart without angina pectoris and additional issues. Changed Torsemide 20 mg to 2 tablets daily X 3 days then 1 tablet daily Mon, Wed and Fri. Follow up in 1 year. ? ?Hospital visits:  ?None ? ?Medications: ?Outpatient Encounter Medications as of 11/03/2021  ?Medication Sig  ? apixaban (ELIQUIS) 5 MG TABS tablet Take 1 tablet (5 mg total) by mouth 2 (two) times daily.  ? Ascorbic Acid (VITAMIN C PO) Take 4 tablets by mouth daily as needed (flu like symptopms).   ? clotrimazole (LOTRIMIN) 1 % cream Apply 1 application topically daily.  ? diclofenac sodium (VOLTAREN) 1 % GEL Apply 1 application topically daily as needed (ankle pain).  ? diltiazem (CARDIZEM CD) 120 MG 24 hr capsule TAKE 1 CAPSULE BY MOUTH EVERY DAY  ? fluticasone (FLONASE) 50 MCG/ACT nasal spray Place 1 spray into both nostrils daily as needed for allergies or rhinitis.  ? gabapentin (NEURONTIN) 100 MG capsule Take 1 capsule in the morning and take 2 capsules in the evening  ? isosorbide mononitrate (IMDUR) 60 MG 24 hr tablet Take 1 tablet (60 mg total) by mouth daily.  ? ketoconazole (NIZORAL) 2 % cream Apply 1 application topically 2 (two) times daily as needed for irritation.  ? levothyroxine (SYNTHROID) 200 MCG tablet Take 1 tablet (200 mcg total) by mouth daily.  ? magnesium oxide (MAG-OX) 400 MG tablet Take 400 mg by mouth daily as needed (restless leg).  ? nitroGLYCERIN (NITROSTAT) 0.4 MG SL tablet Place 1 tablet (0.4 mg total) under the tongue every 5 (five) minutes as needed for chest pain.  ?  omeprazole (PRILOSEC) 40 MG capsule Take 1 capsule (40 mg total) by mouth daily.  ? oxycodone (ROXICODONE) 30 MG immediate release tablet Take 1 tablet (30 mg total) by mouth every 6 (six) hours as needed for pain.  ? potassium chloride SA (KLOR-CON M) 20 MEQ tablet TAKE ONE TABLET BY MOUTH ONCE DAILY FOR potassium replacement  ? ropinirole (REQUIP) 5 MG tablet Take 1 tablet (5 mg total) by mouth in the morning, at noon, and at bedtime.  ? torsemide (DEMADEX) 20 MG tablet Take 2 tablets by mouth daily X's 3 days then reduce to taking 1 tablet by mouth on Mondays, Wednesdays, & Fridays  ? vitamin B-12 (CYANOCOBALAMIN) 100 MCG tablet Take 100 mcg by mouth daily.  ? ?No facility-administered encounter medications on file as of 11/03/2021.  ? ?Reviewed chart for medication changes ahead of medication coordination call. ? ?No OVs, Consults, or hospital visits since last care coordination call/Pharmacist visit. (If appropriate, list visit date, provider name) ? ?No medication changes indicated OR if recent visit, treatment plan here. ? ?BP Readings from Last 3 Encounters:  ?10/10/21 106/64  ?08/29/21 124/76  ?08/16/21 110/76  ?  ?Lab Results  ?Component Value Date  ? HGBA1C 6.7 (H) 06/26/2021  ?  ? ?Patient obtains medications through Adherence Packaging  30 Days  ?  ?Last adherence delivery included: ?Diltiazem 120 mg - 1 tablet at breakfast ?Gabapentin 100 mg - 1 tablet at breakfast and 2 at bedtime (confirmed with patient this  is how he is taking) ?Vitamin B12 500 mcg - 1 tablet at breakfast and bedtime ?Omeprazole 40 mg - 1 tablet at breakfast ?Klor-con 20 meq - 1 tablet at breakfast ?Torsemide 20 mg - 1 tablet at breakfast ?Levothyroxine 200 mcg - 1 tablet before breakfast ?  ?Patient declined last month: Patient did not decline any medications  ?  ?  ?Patient is due for next adherence delivery on: 11/14/2021 ?  ?Called patient and reviewed medications and coordinated delivery. ?  ?This delivery to include: ?Diltiazem  120 mg - 1 tablet at breakfast ?Gabapentin 100 mg - 1 tablet at breakfast and 2 at bedtime  ?Vitamin B12 500 mcg - 1 tablet at breakfast and bedtime ?Omeprazole 40 mg - 1 tablet at breakfast ?Klor-con 20 meq - 1 tablet at breakfast ?Torsemide 20 mg - 1 tablet at breakfast on Mon, Wed and Fri ?Levothyroxine 200 mcg - 1 tablet before breakfast ?Isosorbide 60 mg - 1 tablet at breakfast ?  ?Patient will need a short fill: No short fill needed ?  ?Coordinated acute fill: No acute fill needed ?  ?Patient declined the following medications: Patient did not decline any medications  ?  ?Confirmed delivery date of 11/14/2021 , advised patient that pharmacy will contact them the morning of delivery. ?  ? ?Reviewed chart prior to disease state call. Spoke with patient regarding BP ? ?Recent Office Vitals: ?BP Readings from Last 3 Encounters:  ?10/10/21 106/64  ?08/29/21 124/76  ?08/16/21 110/76  ? ?Pulse Readings from Last 3 Encounters:  ?10/10/21 61  ?08/29/21 (!) 59  ?08/16/21 99  ?  ?Wt Readings from Last 3 Encounters:  ?10/10/21 228 lb (103.4 kg)  ?08/29/21 214 lb (97.1 kg)  ?08/29/21 214 lb (97.1 kg)  ?  ? ?Kidney Function ?Lab Results  ?Component Value Date/Time  ? CREATININE 0.97 08/02/2021 03:25 PM  ? CREATININE 1.31 (H) 07/31/2021 10:22 AM  ? CREATININE 1.40 (H) 10/09/2017 10:12 AM  ? GFR 64.26 08/27/2018 12:53 PM  ? GFRNONAA >60 08/02/2021 03:25 PM  ? GFRAA >60 02/20/2020 06:05 PM  ? ? ? ?  Latest Ref Rng & Units 08/02/2021  ?  3:25 PM 07/31/2021  ? 10:22 AM 07/20/2021  ? 11:25 AM  ?BMP  ?Glucose 70 - 99 mg/dL 138   166   112    ?BUN 8 - 23 mg/dL '12   14   14    '$ ?Creatinine 0.61 - 1.24 mg/dL 0.97   1.31   1.17    ?BUN/Creat Ratio 10 - 24   12    ?Sodium 135 - 145 mmol/L 140   137   140    ?Potassium 3.5 - 5.1 mmol/L 4.0   4.0   4.4    ?Chloride 98 - 111 mmol/L 105   99   101    ?CO2 22 - 32 mmol/L '27   28   25    '$ ?Calcium 8.9 - 10.3 mg/dL 9.0   8.8   9.7    ? ? ?Current antihypertensive regimen:  ?Diltiazem 120 mg  daily ? ?How often are you checking your Blood Pressure? Patient states he checks his blood pressures daily. ? ?Current home BP readings: Patient was out of town and does not have his logs however, he states his readings are always good between 120/70 and 135/80. ? ?What recent interventions/DTPs have been made by any provider to improve Blood Pressure control since last CPP Visit: No recent interventions noted.  ? ?Any  recent hospitalizations or ED visits since last visit with CPP? No recent hospital visits noted.  ? ?What diet changes have been made to improve Blood Pressure Control?  ?Patient follows low sodium ?Breakfast - patient generally doesn't eat breakfast  ?Lunch - patient will have a sandwich of some sort if [place] ?Dinner - patient will have what he is [place] for and sometimes a meal with meat and vegetable. ?Snack - Patient will have a piece of cake if he has made one.  ? ?What exercise is being done to improve your Blood Pressure Control?  ?Patient walks regularly outside back and forth multiple times a day to his garage.  ? ?Adherence Review: ?Is the patient currently on ACE/ARB medication? No ?Does the patient have >5 day gap between last estimated fill dates? No ? ?Care Gaps: ?AWV - scheduled 08/31/2022 ?Last BP - 106/64 on 10/10/2021 ?Last A1C- 6.7 on 06/26/2022 ?Malb - never done ?Covid vaccine - never done ?  ?Star Rating Drugs: ?None ? ?Gennie Alma CMA  ?Clinical Pharmacist Assistant ?[phone] ? ?

## 2021-11-06 ENCOUNTER — Telehealth: Payer: Self-pay | Admitting: Cardiovascular Disease

## 2021-11-06 MED ORDER — TORSEMIDE 20 MG PO TABS: ORAL_TABLET | ORAL | 3 refills | Status: DC

## 2021-11-06 NOTE — Telephone Encounter (Signed)
Refill for Torsemide 20 mg taking 1 on Mondays, Wednesdays, & Fridays, #40 has been sent to [facility] per pt's request.  ?

## 2021-11-06 NOTE — Telephone Encounter (Signed)
?*  STAT* If patient is at the pharmacy, call can be transferred to refill team. ? ? ?1. Which medications need to be refilled? (please list name of each medication and dose if known) torsemide (DEMADEX) 20 MG tablet ? ?2. Which pharmacy/location (including street and city if local pharmacy) is medication to be sent to? [facility] - [place], [place] - [place] [address] ? ?3. Do they need a 30 day or 90 day supply? 30 day supply ? ? ?Needs new prescription with updated instructions.   ?

## 2021-11-08 DIAGNOSIS — B0052 Herpesviral keratitis: Secondary | ICD-10-CM | POA: Diagnosis not present

## 2021-11-20 DIAGNOSIS — B0052 Herpesviral keratitis: Secondary | ICD-10-CM | POA: Diagnosis not present

## 2021-11-21 ENCOUNTER — Encounter: Payer: Self-pay | Admitting: Family Medicine

## 2021-11-21 ENCOUNTER — Ambulatory Visit: Payer: Medicare HMO | Admitting: Family Medicine

## 2021-11-21 ENCOUNTER — Telehealth: Payer: Self-pay | Admitting: Pharmacist

## 2021-11-21 ENCOUNTER — Telehealth: Payer: Self-pay | Admitting: Family Medicine

## 2021-11-21 ENCOUNTER — Ambulatory Visit (INDEPENDENT_AMBULATORY_CARE_PROVIDER_SITE_OTHER): Payer: Medicare HMO | Admitting: Family Medicine

## 2021-11-21 VITALS — BP 120/80 | HR 60 | Temp 97.8°F | Wt 220.0 lb

## 2021-11-21 DIAGNOSIS — E1140 Type 2 diabetes mellitus with diabetic neuropathy, unspecified: Secondary | ICD-10-CM | POA: Diagnosis not present

## 2021-11-21 DIAGNOSIS — M109 Gout, unspecified: Secondary | ICD-10-CM

## 2021-11-21 DIAGNOSIS — G629 Polyneuropathy, unspecified: Secondary | ICD-10-CM

## 2021-11-21 LAB — BASIC METABOLIC PANEL
BUN: 16 mg/dL (ref 6–23)
CO2: 28 mEq/L (ref 19–32)
Calcium: 9.5 mg/dL (ref 8.4–10.5)
Chloride: 102 mEq/L (ref 96–112)
Creatinine, Ser: 1.24 mg/dL (ref 0.40–1.50)
GFR: 55.07 mL/min — ABNORMAL LOW (ref >60.00)
Glucose, Bld: 101 mg/dL — ABNORMAL HIGH (ref 70–99)
Potassium: 4 mEq/L (ref 3.5–5.1)
Sodium: 138 mEq/L (ref 135–145)

## 2021-11-21 LAB — HEMOGLOBIN A1C: Hgb A1c MFr Bld: 6.3 % (ref 4.6–6.5)

## 2021-11-21 MED ORDER — GLIPIZIDE 5 MG PO TABS: 5.0000 mg | ORAL_TABLET | Freq: Two times a day (BID) | ORAL | 5 refills | Status: DC

## 2021-11-21 NOTE — Progress Notes (Signed)
? ?  Subjective:  ? ? Patient ID: [name], male    DOB: 06/08/1941, 81 y.o.   MRN: [id number] ? ?HPI ?Here for several issues. First he has had swelling and pain in the right foot for 2 weeks. It is actually getting better now but he wants [place] to check it. No recent trauma. Also he describes numbness in the right heel that started about 6 months ago. Of note his A1c in December was 6.7. His am fasting glucoses at home range from 120 to 200. We tried him on Metformin a few years ago but he did not tolerate it.  ? ? ?Review of Systems  ?Constitutional: Negative.   ?Respiratory: Negative.    ?Cardiovascular: Negative.   ?Musculoskeletal:  Positive for arthralgias.  ?Neurological:  Positive for numbness.  ? ?   ?Objective:  ? Physical Exam ?Constitutional:   ?   Appearance: Normal appearance.  ?Cardiovascular:  ?   Rate and Rhythm: Normal rate and regular rhythm.  ?   Pulses: Normal pulses.  ?   Heart sounds: Normal heart sounds.  ?Pulmonary:  ?   Effort: Pulmonary effort is normal.  ?   Breath sounds: Normal breath sounds.  ?Musculoskeletal:  ?   Comments: The right foot appears normal with no swelling or erythema. He is tender across the forefoot.   ?Neurological:  ?   Mental Status: He is alert.  ? ? ? ? ? ?   ?Assessment & Plan:  ?He has getting over a gout flare. This has almost resolved so we will not treat it today. He has mild type 2 diabetes, and the numbness in the right heel is likely due to diabetic neuropathy. We will start him on Glipizide 5 mg BID. Check a BMET and A1c today.  ?[name], MD ? ? ?

## 2021-11-21 NOTE — Chronic Care Management (AMB) (Signed)
? ? ?  Chronic Care Management ?Pharmacy Assistant  ? ?Name: [name]  MRN: [id number] DOB: [birth year] ? ?11/22/2021 APPOINTMENT REMINDER ? ?[name] was reminded to have all medications, supplements and any blood glucose and blood pressure readings available for review with [name], Pharm. D, at his telephone visit on 11/22/2021 at 9:00. ? ?Care Gaps: ?AWV - scheduled 08/31/2022 ?Last BP - 106/64 on 10/10/2021 ?Last A1C- 6.7 on 06/26/2022 ?Malb - never done ?Covid vaccine - never done ?  ?Star Rating Drugs: ?None ? ?Any gaps in medications fill history? ? ?Gennie Alma CMA  ?Clinical Pharmacist Assistant ?[phone] ? ?

## 2021-11-21 NOTE — Telephone Encounter (Signed)
Application for Disability License Plate to be filled out--placed in dr's folder.  Call [phone] upon completion.  ?

## 2021-11-21 NOTE — Telephone Encounter (Signed)
Pt for was placed on Dr Sarajane Jews red folder for completing ?

## 2021-11-22 ENCOUNTER — Telehealth: Payer: Medicare HMO
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
## 2021-11-23 NOTE — Telephone Encounter (Signed)
Spoke with pt aware that disability form is ready for pick up at the office, pt state that he will pick up form tomorrow morning. Form left at the front office cabinet, copy sent to scanning ?

## 2021-11-27 ENCOUNTER — Other Ambulatory Visit: Payer: Self-pay | Admitting: Family Medicine

## 2021-11-27 ENCOUNTER — Telehealth: Payer: Self-pay | Admitting: Family Medicine

## 2021-11-27 DIAGNOSIS — R21 Rash and other nonspecific skin eruption: Secondary | ICD-10-CM | POA: Diagnosis not present

## 2021-11-27 NOTE — Telephone Encounter (Signed)
Patient called because he is running low on oxycodone (ROXICODONE) 30 MG immediate release tablet and wants to know if he needs a PMV or not. ? ? ? ? ? ? ? ?Please send to  ? ? ? ? ? ?Please advise  ?

## 2021-11-28 NOTE — Telephone Encounter (Signed)
He needs another PMV  ?

## 2021-11-28 NOTE — Telephone Encounter (Signed)
Spoke with patient, informed pain management visit it to be scheduled before refill.  ?

## 2021-11-28 NOTE — Telephone Encounter (Signed)
Last OV- 11/21/21 ?Last refill-11/01/21--120 tabs, 0 refills ? ?No future OV scheduled.   ?

## 2021-12-01 ENCOUNTER — Encounter: Payer: Self-pay | Admitting: Family Medicine

## 2021-12-01 ENCOUNTER — Telehealth (INDEPENDENT_AMBULATORY_CARE_PROVIDER_SITE_OTHER): Payer: Medicare HMO | Admitting: Family Medicine

## 2021-12-01 DIAGNOSIS — F1190 Opioid use, unspecified, uncomplicated: Secondary | ICD-10-CM

## 2021-12-01 DIAGNOSIS — M5440 Lumbago with sciatica, unspecified side: Secondary | ICD-10-CM | POA: Diagnosis not present

## 2021-12-01 MED ORDER — NEOMYCIN-POLYMYXIN-HC 3.5-10000-1 OT SUSP: 4.0000 [drp] | Freq: Four times a day (QID) | OTIC | 0 refills | Status: DC

## 2021-12-01 MED ORDER — OXYCODONE HCL 30 MG PO TABS: 30.0000 mg | ORAL_TABLET | Freq: Four times a day (QID) | ORAL | 0 refills | Status: DC | PRN

## 2021-12-01 NOTE — Progress Notes (Signed)
? ?  Subjective:  ? ? Patient ID: [name], male    DOB: [birth year], 81 y.o.   MRN: [id number] ? ?HPI ?Virtual Visit via Telephone Note ? ?I connected with the patient on 12/01/21 at 10:15 AM EDT by telephone and verified that I am speaking with the correct person using two identifiers. ?  ?I discussed the limitations, risks, security and privacy concerns of performing an evaluation and management service by telephone and the availability of in person appointments. I also discussed with the patient that there may be a patient responsible charge related to this service. The patient expressed understanding and agreed to proceed. ? ?Location patient: home ?Location provider: work or home office ?Participants present for the call: patient, provider ?Patient did not have a visit in the prior 7 days to address this/these issue(s). ? ? ?History of Present Illness: ?Here for pain manageme\nt. He is doing well.  ?  ?Observations/Objective: ?Patient sounds cheerful and well on the phone. ?I do not appreciate any SOB. ?Speech and thought processing are grossly intact. ?Patient reported vitals: ? ?Assessment and Plan: ?Pain management.  ?Indication for chronic opioid: low back pain ?Medication and dose: Oxycodone 30 mg  ?# pills per month: 120 ?Last UDS date: 08-29-21 ?Opioid Treatment Agreement signed (Y/N): 08-26-17 ?Opioid Treatment Agreement last reviewed with patient:  12-01-21 ?NCCSRS reviewed this encounter (include red flags): Yes ?Meds were refilled.  ?[name], MD ? ? ? ?Follow Up Instructions: ? ? ? ? ?51025 5-10 ?99442 11-20 ?9443 21-30 ?I did not refer this patient for an OV in the next 24 hours for this/these issue(s). ? ?I discussed the assessment and treatment plan with the patient. The patient was provided an opportunity to ask questions and all were answered. The patient agreed with the plan and demonstrated an understanding of the instructions. ?  ?The patient was advised to call back or seek an in-person  evaluation if the symptoms worsen or if the condition fails to improve as anticipated. ? ?I provided  15 minutes of non-face-to-face time during this encounter. ? ? ?[name], MD   ? ? ?Review of Systems ? ?   ?Objective:  ? Physical Exam ? ? ? ? ?   ?Assessment & Plan:  ? ? ?

## 2021-12-05 ENCOUNTER — Telehealth: Payer: Self-pay | Admitting: Pharmacist

## 2021-12-05 NOTE — Chronic Care Management (AMB) (Signed)
Chronic Care Management Pharmacy Assistant   Name: [name]  MRN: [id number] DOB: 01-Mar-1941  Reason for Encounter: Medication Review / Medication Coordination Call   Conditions to be addressed/monitored: HTN  Recent office visits:  12/01/2021 [name] MD - Patient was seen for Chronic narcotic use and an additional issue. Started Cortisporin 4 drops in left ear 4 times daily. Changed Oxycodone to 30 mg 1 tablet every 6 hours as needed. (To start on 01/31/2022) No follow up noted.   11/21/2021 [name] MD - Patient was seen for Type 2 diabetes mellitus with diabetic neuropathy, without long-term current use of insulin and additional issues. Started Glipizide 5 mg twice daily before meals. No follow up noted.   Recent consult visits:  None  Hospital visits:  None  Medications: Outpatient Encounter Medications as of 12/05/2021  Medication Sig   apixaban (ELIQUIS) 5 MG TABS tablet Take 1 tablet (5 mg total) by mouth 2 (two) times daily.   Ascorbic Acid (VITAMIN C PO) Take 4 tablets by mouth daily as needed (flu like symptopms).    clotrimazole (LOTRIMIN) 1 % cream Apply 1 application topically daily.   diclofenac sodium (VOLTAREN) 1 % GEL Apply 1 application topically daily as needed (ankle pain).   diltiazem (CARDIZEM CD) 120 MG 24 hr capsule TAKE 1 CAPSULE BY MOUTH EVERY DAY   fluticasone (FLONASE) 50 MCG/ACT nasal spray Place 1 spray into both nostrils daily as needed for allergies or rhinitis.   gabapentin (NEURONTIN) 100 MG capsule TAKE 1 CAPSULE BY MOUTH THREE TIMES A DAY   glipiZIDE (GLUCOTROL) 5 MG tablet Take 1 tablet (5 mg total) by mouth 2 (two) times daily before a meal.   isosorbide mononitrate (IMDUR) 60 MG 24 hr tablet Take 1 tablet (60 mg total) by mouth daily.   ketoconazole (NIZORAL) 2 % cream Apply 1 application topically 2 (two) times daily as needed for irritation.   levothyroxine (SYNTHROID) 200 MCG tablet Take 1 tablet (200 mcg total) by mouth  daily.   magnesium oxide (MAG-OX) 400 MG tablet Take 400 mg by mouth daily as needed (restless leg).   neomycin-polymyxin-hydrocortisone (CORTISPORIN) 3.5-10000-1 OTIC suspension Place 4 drops into the left ear 4 (four) times daily.   nitroGLYCERIN (NITROSTAT) 0.4 MG SL tablet Place 1 tablet (0.4 mg total) under the tongue every 5 (five) minutes as needed for chest pain.   omeprazole (PRILOSEC) 40 MG capsule Take 1 capsule (40 mg total) by mouth daily.   [START ON 01/31/2022] oxycodone (ROXICODONE) 30 MG immediate release tablet Take 1 tablet (30 mg total) by mouth every 6 (six) hours as needed for pain.   potassium chloride SA (KLOR-CON M) 20 MEQ tablet TAKE ONE TABLET BY MOUTH ONCE DAILY FOR potassium replacement   ropinirole (REQUIP) 5 MG tablet Take 1 tablet (5 mg total) by mouth in the morning, at noon, and at bedtime.   torsemide (DEMADEX) 20 MG tablet Take 2 tablets by mouth daily X's 3 days then reduce to taking 1 tablet by mouth on Mondays, Wednesdays, & Fridays   vitamin B-12 (CYANOCOBALAMIN) 100 MCG tablet Take 100 mcg by mouth daily.   No facility-administered encounter medications on file as of 12/05/2021.   Reviewed chart for medication changes ahead of medication coordination call.  No OVs, Consults, or hospital visits since last care coordination call/Pharmacist visit. (If appropriate, list visit date, provider name)  No medication changes indicated OR if recent visit, treatment plan here.  BP Readings from Last 3  Encounters:  11/21/21 120/80  10/10/21 106/64  08/29/21 124/76    Lab Results  Component Value Date   HGBA1C 6.3 11/21/2021     Patient obtains medications through Adherence Packaging  30 Days    Last adherence delivery included: Diltiazem 120 mg - 1 tablet at breakfast Gabapentin 100 mg - 1 tablet at breakfast and 2 at bedtime  Vitamin B12 500 mcg - 1 tablet at breakfast and bedtime Omeprazole 40 mg - 1 tablet at breakfast Klor-con 20 meq - 1 tablet at  breakfast Torsemide 20 mg - 1 tablet at breakfast on Mon, Wed and Fri Levothyroxine 200 mcg - 1 tablet before breakfast Isosorbide 60 mg - 1 tablet at breakfast   Patient declined last month: Patient did not decline any medications      Patient is due for next adherence delivery on: 12/14/2021   Called patient and reviewed medications and coordinated delivery.   This delivery to include: Diltiazem 120 mg - 1 tablet at breakfast Gabapentin 100 mg - 1 tablet at breakfast and 2 at bedtime  Vitamin B12 500 mcg - 1 tablet at breakfast and bedtime Klor-con 20 meq - 1 tablet at breakfast Torsemide 20 mg - 1 tablet at breakfast on Mon, Wed and Fri Levothyroxine 200 mcg - 1 tablet before breakfast Isosorbide 60 mg - 1 tablet at breakfast Glipizide 5 mg - 1 tablet at breakfast and dinner Nitroglycerin 0.4 mg - 1 tablet under tongue every 5 min as needed for chest pain Apixaban 5 mg - take 1 tablet at breakfast and bedtime (new med being added to order) (rx requested from [facility] at Dr. Camillia Herter)   Patient will need a short fill: No short fill needed   Coordinated acute fill: No acute fill needed   Patient declined the following medications:  Omeprazole 40 mg - 1 tablet at breakfast - patient has plenty on hand   Confirmed delivery date of 12/14/2021 , advised patient that pharmacy will contact them the morning of delivery.    Care Gaps: AWV - scheduled 08/31/2022 Last BP - 120/80 on 11/21/2021 Last A1C- 6.3 on 11/21/2021 Covid vaccine - never done Foot exam - never done Eye exam - never done Malb - never done Shingrix - never done Pneumovax - postponed  Star Rating Drugs: Glipizide 5 mg  - last filled 11/21/2021 25 DS at [facility] [phone]

## 2021-12-06 ENCOUNTER — Telehealth: Payer: Self-pay | Admitting: Cardiovascular Disease

## 2021-12-06 DIAGNOSIS — I4892 Unspecified atrial flutter: Secondary | ICD-10-CM

## 2021-12-06 MED ORDER — APIXABAN 5 MG PO TABS: 5.0000 mg | ORAL_TABLET | Freq: Two times a day (BID) | ORAL | 2 refills | Status: DC

## 2021-12-06 NOTE — Telephone Encounter (Signed)
? ?*  STAT* If patient is at the pharmacy, call can be transferred to refill team. ? ? ?1. Which medications need to be refilled? (please list name of each medication and dose if known) apixaban (ELIQUIS) 5 MG TABS tablet ? ?2. Which pharmacy/location (including street and city if local pharmacy) is medication to be sent to? [facility] - [place], [place] - [place] [address] ? ?3. Do they need a 30 day or 90 day supply? 30 days ? ?Per [facility], they are getting ready to send pt's medication if they can get refill today to include this refill ?

## 2021-12-06 NOTE — Telephone Encounter (Signed)
Eliquis '5mg'$  refill request received. Patient is 81 years old, weight-99.8kg, Crea-1.24 on 11/21/2021, Diagnosis-Afib/Aflutter, and last seen by Ambrose Pancoast on 10/10/2021. Dose is appropriate based on dosing criteria. Will send in refill to requested pharmacy.   ?

## 2021-12-14 ENCOUNTER — Ambulatory Visit (INDEPENDENT_AMBULATORY_CARE_PROVIDER_SITE_OTHER): Payer: Medicare HMO

## 2021-12-14 DIAGNOSIS — M5440 Lumbago with sciatica, unspecified side: Secondary | ICD-10-CM

## 2021-12-14 DIAGNOSIS — I2510 Atherosclerotic heart disease of native coronary artery without angina pectoris: Secondary | ICD-10-CM

## 2021-12-14 DIAGNOSIS — I4892 Unspecified atrial flutter: Secondary | ICD-10-CM

## 2021-12-14 DIAGNOSIS — E1140 Type 2 diabetes mellitus with diabetic neuropathy, unspecified: Secondary | ICD-10-CM

## 2021-12-14 DIAGNOSIS — E785 Hyperlipidemia, unspecified: Secondary | ICD-10-CM

## 2021-12-14 DIAGNOSIS — I10 Essential (primary) hypertension: Secondary | ICD-10-CM

## 2021-12-14 NOTE — Patient Instructions (Signed)
Visit Information  Thank you for taking time to visit with me today. Please don't hesitate to contact me if I can be of assistance to you before our next scheduled telephone appointment.  Following are the goals we discussed today:  Take all medications as prescribed Attend all scheduled provider appointments Call pharmacy for medication refills 3-7 days in advance of running out of medications Perform all self care activities independently  Perform IADL's (shopping, preparing meals, housekeeping, managing finances) independently Call provider office for new concerns or questions  call office if I gain more than 2 pounds in one day or 5 pounds in one week keep legs up while sitting watch for swelling in feet, ankles and legs every day weigh myself daily follow rescue plan if symptoms flare-up eat more whole grains, fruits and vegetables, lean meats and healthy fats check blood sugar at prescribed times: once daily and when you have symptoms of low or high blood sugar drink 6 to 8 glasses of water each day manage portion size switch to sugar-free drinks check pulse (heart) rate once a day make a plan to eat healthy take medicine as prescribed check blood pressure daily write blood pressure results in a log or diary take medications for blood pressure exactly as prescribed limit salt intake to '2000mg'$ /day call for medicine refill 2 or 3 days before it runs out take all medications exactly as prescribed call doctor with any symptoms you believe are related to your medicine learn relaxation techniques - practice acceptance of chronic pain - practice relaxation or meditation daily - tell myself I can (not I can't) - think of new ways to do favorite things - use distraction techniques - use relaxation during pain Our next appointment is by telephone on 01/16/22 at 9 AM  Please call the care guide team at [phone] if you need to cancel or reschedule your appointment.   If you are  experiencing a Mental Health or [place] or need someone to talk to, please call the Suicide and Crisis Lifeline: 988 call the [place] National Suicide Prevention Lifeline: (602)097-5325 or TTY: 775-207-3431 TTY 580-406-5182) to talk to a trained counselor call 1-800-273-TALK (toll free, 24 hour hotline) go to [facility] [address], [place] [phone]) call 911   Patient verbalizes understanding of instructions and care plan provided today and agrees to view in [place]. Active MyChart status and patient understanding of how to access instructions and care plan via MyChart confirmed with patient.     Peter Garter RN, Jackquline Denmark, CDE Care Management Coordinator [facility] [phone]
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
## 2021-12-20 DIAGNOSIS — I110 Hypertensive heart disease with heart failure: Secondary | ICD-10-CM

## 2021-12-20 DIAGNOSIS — E1159 Type 2 diabetes mellitus with other circulatory complications: Secondary | ICD-10-CM | POA: Diagnosis not present

## 2021-12-20 DIAGNOSIS — I4891 Unspecified atrial fibrillation: Secondary | ICD-10-CM | POA: Diagnosis not present

## 2021-12-20 DIAGNOSIS — Z87891 Personal history of nicotine dependence: Secondary | ICD-10-CM

## 2021-12-20 DIAGNOSIS — I2510 Atherosclerotic heart disease of native coronary artery without angina pectoris: Secondary | ICD-10-CM | POA: Diagnosis not present

## 2021-12-20 DIAGNOSIS — Z7984 Long term (current) use of oral hypoglycemic drugs: Secondary | ICD-10-CM

## 2021-12-20 DIAGNOSIS — I509 Heart failure, unspecified: Secondary | ICD-10-CM

## 2021-12-20 DIAGNOSIS — E785 Hyperlipidemia, unspecified: Secondary | ICD-10-CM | POA: Diagnosis not present

## 2021-12-27 ENCOUNTER — Ambulatory Visit (INDEPENDENT_AMBULATORY_CARE_PROVIDER_SITE_OTHER): Payer: Self-pay

## 2021-12-27 DIAGNOSIS — I495 Sick sinus syndrome: Secondary | ICD-10-CM

## 2021-12-28 ENCOUNTER — Telehealth: Payer: Self-pay | Admitting: Pharmacist

## 2021-12-28 NOTE — Chronic Care Management (AMB) (Signed)
    Chronic Care Management Pharmacy Assistant   Name: [name]  MRN: [id number] DOB: [birth year]  12/29/2021 APPOINTMENT REMINDER  Called [name], No answer, left message of appointment on 12/29/2021 at 9:30 via telephone visit with [name] Pharm D. Notified to have all medications, supplements, blood pressure and/or blood sugar logs available during appointment and to return call if need to reschedule.  Care Gaps: AWV - scheduled 08/31/2022 Last BP - 120/80 on 11/21/2021 Last A1C - 6.3 on 11/21/2021 Covid vaccine - never done Foot Exam - never done Eye exam - never done Malb - never done Shingrix - never done Pneumonia vaccine - postponed  Star Rating Drug: Glipizide 5 mg - last filled 12/10/2021 30 DS at [facility]  Any gaps in medications fill history? No  [name] [facility]  Catering manager [phone]

## 2021-12-28 NOTE — Progress Notes (Signed)
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 [facility]

## 2021-12-29 ENCOUNTER — Telehealth: Payer: Self-pay | Admitting: *Deleted

## 2021-12-29 ENCOUNTER — Ambulatory Visit (INDEPENDENT_AMBULATORY_CARE_PROVIDER_SITE_OTHER): Payer: Medicare HMO | Admitting: Pharmacist

## 2021-12-29 DIAGNOSIS — E1140 Type 2 diabetes mellitus with diabetic neuropathy, unspecified: Secondary | ICD-10-CM

## 2021-12-29 DIAGNOSIS — I10 Essential (primary) hypertension: Secondary | ICD-10-CM

## 2021-12-29 LAB — CUP PACEART REMOTE DEVICE CHECK
Battery Remaining Longevity: 105 mo
Battery Voltage: 2.98 V
Brady Statistic AP VP Percent: 18.98 %
Brady Statistic AP VS Percent: 14.44 %
Brady Statistic AS VP Percent: 24.79 %
Brady Statistic AS VS Percent: 41.79 %
Brady Statistic RA Percent Paced: 32.78 %
Brady Statistic RV Percent Paced: 43.74 %
Date Time Interrogation Session: 20230607005700
Implantable Lead Implant Date: 20180604
Implantable Lead Implant Date: 20180604
Implantable Lead Location: 753859
Implantable Lead Location: 753860
Implantable Lead Model: 5076
Implantable Lead Model: 5076
Implantable Pulse Generator Implant Date: 20180604
Lead Channel Impedance Value: 304 Ohm
Lead Channel Impedance Value: 361 Ohm
Lead Channel Impedance Value: 418 Ohm
Lead Channel Impedance Value: 456 Ohm
Lead Channel Pacing Threshold Amplitude: 0.625 V
Lead Channel Pacing Threshold Amplitude: 1 V
Lead Channel Pacing Threshold Pulse Width: 0.4 ms
Lead Channel Pacing Threshold Pulse Width: 0.4 ms
Lead Channel Sensing Intrinsic Amplitude: 2.625 mV
Lead Channel Sensing Intrinsic Amplitude: 2.625 mV
Lead Channel Sensing Intrinsic Amplitude: 5.375 mV
Lead Channel Sensing Intrinsic Amplitude: 5.375 mV
Lead Channel Setting Pacing Amplitude: 1.5 V
Lead Channel Setting Pacing Amplitude: 2.5 V
Lead Channel Setting Pacing Pulse Width: 0.4 ms
Lead Channel Setting Sensing Sensitivity: 1.2 mV

## 2021-12-29 MED ORDER — POTASSIUM CHLORIDE 20 MEQ/15ML (10%) PO SOLN: 20.0000 meq | Freq: Every day | ORAL | 5 refills | Status: DC

## 2021-12-29 NOTE — Patient Instructions (Signed)
Hi Cabe,  It was great to catch up again!  Please reach out to me if you have any questions or need anything before our follow up!  Best, [place], PharmD, [facility]   Visit Information   Goals Addressed   None    Patient Care Plan: RNCM:Cardiovascular disease Management (HF,CAD,AF,HTN and HLD)  Completed 06/09/2021   Problem Identified: Lack of long term managment of Cardiovascular disease (HF,CAD,AF,HTN and HLD) Resolved 06/09/2021  Priority: High     Long-Range Goal: Effective long term Cardiovascular disease Management (HF,CAD,AF,HTN and HLD) Completed 06/09/2021  Start Date: 02/27/2021  Expected End Date: 07/23/2021  Recent Progress: On track  Priority: High  Note:   Resolving due to duplicate goal  Current Barriers:  Knowledge deficits related to basic Cardiovascular disease Management (HF,CAD,AF,HTN and HLD) pathophysiology and self care management Unable to independently Self manage Cardiovascular disease (HF,CAD,AF,HTN and HLD) Does not adhere to provider recommendations re:  Does not adhere to prescribed medication regimen Financial strain Pt states he sometimes does not take his fluid pill if he is going out.  States he had some chest pain that was a pulled muscle when he over did it.   States he has had some swelling in his legs from his gout.  States he gets short of breath with exertion but it has not increased.  States he is walking with his lady friend at [employer] for 30-60 minutes most days  States he has been trying to  eat more healthy as his friend cooks healthy.  States he usually weights every day and he weighted 207 yesterday.  States his B/P has been good with his last reading 128/70.   Nurse Case Manager Clinical Goal(s):  patient will weigh self daily and record patient will verbalize understanding of Heart Failure Action Plan and when to call doctor patient will take all  Heart Failure mediations as prescribed Interventions:  Collaboration with [name], MD regarding development and update of comprehensive plan of care as evidenced by provider attestation and co-signature Inter-disciplinary care team collaboration (see longitudinal plan of care) Reviewed basic overview and discussion of pathophysiology of Heart Failure Reinforced to follow a low sodium diet and to not add salt to his watermelon or cantaloupe  Reviewed Heart Failure Action Plan  Assessed for scales in home-has scales Reinforced importance of daily weight Reinforced role of diuretics in prevention of fluid overload Reinforced education to patient re: stroke prevention, s/s of heart attack and stroke, DASH diet, complications of uncontrolled blood pressure Advised patient, providing education and rationale, to monitor blood pressure 3 times a week and record, calling PCP for findings outside established parameters Reviewed to keep appointment with cardiology on 06/28/21 Pharmacy referral for issues with adherence and knowledge of medications -scheduled to see CCM PharmD 05/15/21  Reinforced to use his arm B/P monitor instead of the wrist monitor to get more accurate readings  Reviewed to take a log of his B/P readings and B/P monitor to his appointment with CCM PharmD Encouraged to continue walking but to pace activity  Self-Care Activities:  Takes Heart Failure Medications as prescribed Weighs daily and record (notifying MD of 3 lb weight gain over night or 5 lb in a week) Verbalizes understanding of and follows CHF Action Plan Adheres to low sodium diet  Patient Goals:  - Take Heart Failure Medications as prescribed - Weigh daily and record (notify MD with 3 lb weight gain over night  or 5 lb in a week) - Follow CHF Action Plan - Adhere to low sodium diet - develop a rescue plan - eat more whole grains, fruits and vegetables, lean meats and healthy fats - follow rescue plan if symptoms  flare-up - know when to call the doctor - track symptoms and what helps feel better or worse - dress right for the weather, hot or cold - avoid heavy exercise on very hot days - pace activity allowing for rest - call office if I gain more than 2 pounds in one day or 5 pounds in one week - keep legs up while sitting - track weight in diary - use salt in moderation - watch for swelling in feet, ankles and legs every day - weigh myself daily - check blood pressure 3 times per week - choose a place to take my blood pressure (home, clinic or office, retail store) - write blood pressure results in a log or diary Follow Up Plan: Telephone follow up appointment with care management team member scheduled for: 06/09/21 at 9 AM The patient has been provided with contact information for the care management team and has been advised to call with any health related questions or concerns.      Patient Care Plan: RNCM:Chronic Pain (Adult)  Completed 06/09/2021   Problem Identified: Chronic Pain Management (Chronic Pain) Resolved 06/09/2021     Long-Range Goal: Chronic Pain Managed Completed 06/09/2021  Start Date: 02/27/2021  Expected End Date: 07/23/2021  Recent Progress: On track  Priority: Medium  Note:   Resolving due to duplicate goal  Current Barriers:  Knowledge Deficits related to self-health management of acute or chronic pain lower back and restless leg syndrome Chronic Disease Management support and education needs related to chronic pain Knowledge Deficits related to self management of chronic lower back pain Chronic Disease Management support and education needs related to self management of chronic lower back pain Unable to independently self management of chronic lower back pain States he his chronic pain still gets worse as the day goes by.   States his restless leg syndrome is an issues and is not sure if medication has helped.  States he takes his pain medication in the evening  which helps some.  Clinical Goal(s):  patient will verbalize understanding of plan for pain management. , patient will meet with RN Care Manager to address self management of chronic lower back pain, patient will attend all scheduled medical appointments: labs CCM PharmD 05/15/21, Dr. [name] 06/28/21, patient will demonstrate use of different relaxation  skills and/or diversional activities to assist with pain reduction (distraction, imagery, relaxation, massage, acupressure, TENS, heat, and cold application., patient will report pain at a level less than 3 to 4 on a 10-10 rating scale., patient will use pharmacological and nonpharmacological pain relief strategies as prescribed. , and patient will verbalize acceptable level of pain relief and ability to engage in desired activities Interventions:  Collaboration with [name], MD regarding development and update of comprehensive plan of care as evidenced by provider attestation and co-signature Pain assessment performed Medications reviewed Discussed plans with patient for ongoing care management follow up and provided patient with direct contact information for care management team Evaluation of current treatment plan related to self management of chronic lower back pain and patient's adherence to plan as established by provider. Reinforced  education to patient re: self management of chronic lower back pain Reviewed medications with patient and discussed adherence Reviewed scheduled/upcoming provider appointments including:  CCM PharmD 05/15/21, Dr. [name] 06/28/21, Pharmacy referral for adherence and knowledge issues-to see CCM PharmD on 05/15/21 Reviewed to pace his activities  Reviewed foods to watch to help prevent gout Patient Goals/Self Care Activities:  Will self-administer medications as prescribed Will attend all scheduled provider appointments Will call pharmacy for medication refills 7 days prior to needed refill date Patient  will calls provider office for new concerns or questions Follow Up Plan: Telephone follow up appointment with care management team member scheduled for: 06/15/21 at 9 AM The patient has been provided with contact information for the care management team and has been advised to call with any health related questions or concerns.       Patient Care Plan: LCSW Plan of Care     Problem Identified: Track and Manage My Symptoms of Grief and Depression. Resolved 03/20/2021  Priority: High     Goal: Track and Manage My Symptoms of Grief and Depression. Completed 03/20/2021  Start Date: 03/20/2021  Expected End Date: 03/20/2021  This Visit's Progress: On track  Priority: High  Note:   Current Barriers:   Acute Mental Health needs related to CAD, HF, HTN, HLD, AFib, Chronic Low Back Pain, Depression with Anxiety and Grief. Needs Support, Education, and Care Coordination in order to meet unmet mental health needs. Clinical Goal(s):  Patient will work with LCSW to reduce and manage symptoms of Depression with Anxiety and Grief.   Patient will increase knowledge and/or ability of:        Coping Skills, Healthy Habits, Self-Management Skills, Stress Reduction, Home Safety and Utilizing [employer] and Resources.   Clinical Interventions:  Assessed patient's previous treatment, needs, coping skills, current treatment, support system and barriers to care. PHQ-2 and PHQ-9 Depression Screening Tool performed and results reviewed with patient. Other interventions included:       Solution-Focused Therapy Performed, Mindfulness Meditation Strategies, Relaxation Techniques and Deep Breathing Exercises Encouraged, Active Listening/       Reflection Utilized, Emotional Support Provided, Problem Solving [place], Psychoeducation /Health Education, Motivational        Interviewing, Brief Cognitive Behavioral Therapy Initiated, Reviewed Mental Health Medications and Discussed  Compliance, Quality of Sleep Assessed and       Sleep Hygiene Techniques Promoted, Support Group Participation Encouraged, Increase Level of Activity/Exercise, Verbalization of Feelings Encouraged,        Crisis Resource Education/Information Provided, Suicidal Ideation/Homicidal Ideation Assessed - None Present.   Patient interviewed and appropriate assessments performed. Provided mental health counseling with regards to Depression with Anxiety and Grief.   Discussed several options for long-term counseling based on need and insurance.  Collaboration with Primary Care Physician, Dr. [name] regarding development and update of comprehensive plan of care as evidenced by provider attestation and co-signature. Inter-disciplinary care team collaboration (see longitudinal plan of care). Patient Goals/Self-Care Activities: Consider self-enrollment in a grief and loss support group.  Incorporate into daily practice - relaxation techniques, deep breathing exercises and mindfulness meditation strategies. Contact LCSW directly (# [id number]) if you change your mind about wanting to receive social work services and resources, or if additional social work needs are identified in the near future. Follow-Up:  No Follow-Up Required, Per Patient.     Patient Care Plan: CCM Pharmacy Care Plan     Problem Identified: Problem: Hypertension, Hyperlipidemia, Atrial Fibrillation, Coronary Artery Disease, GERD, and Gout      Long-Range Goal: Patient-Specific Goal   Start Date: 05/15/2021  Expected End Date: 05/15/2022  Recent  Progress: On track  Priority: High  Note:   Current Barriers:  Unable to independently monitor therapeutic efficacy Unable to achieve control of cholesterol   Pharmacist Clinical Goal(s):  Patient will achieve adherence to monitoring guidelines and medication adherence to achieve therapeutic efficacy achieve control of cholesterol as evidenced by next lipid panel  through  collaboration with PharmD and provider.   Interventions: 1:1 collaboration with [name], MD regarding development and update of comprehensive plan of care as evidenced by provider attestation and co-signature Inter-disciplinary care team collaboration (see longitudinal plan of care) Comprehensive medication review performed; medication list updated in electronic medical record  Hypertension (BP goal <140/90) -Controlled -Current treatment: Diltiazem 120 mg 1 capsule daily - Appropriate, Effective, Safe, Accessible -Medications previously tried: n/a  -Current home readings: 121/76 -Current dietary habits: trying to eat healthier -Current exercise habits: limited by back pain -Denies hypotensive/hypertensive symptoms -Educated on Exercise goal of 150 minutes per week; Importance of home blood pressure monitoring; Proper BP monitoring technique; -Counseled to monitor BP at home weekly, document, and provide log at future appointments -Counseled on diet and exercise extensively Recommended to continue current medication  Swelling (Goal: minimize fluid retention) -Controlled -Current treatment  Torsemide 20 mg 1 tablet twice daily - Appropriate, Effective, Safe, Accessible Potassium chloride 20 mEq 1 tablet daily - Appropriate, Effective, Safe, Accessible -Medications previously tried: none  -Recommended to continue current medication   Hyperlipidemia: (LDL goal < 70) -Uncontrolled -Current treatment: No medication -Medications previously tried: statins (myalgias)  -Current dietary patterns: eating out some but trying to each healthier -Current exercise habits: none -Educated on Cholesterol goals;  Benefits of statin for ASCVD risk reduction; Importance of limiting foods high in cholesterol; -Counseled on diet and exercise extensively Recommended repeat lipid panel and initiation of cholesterol therapy.  CAD (Goal: prevent heart events) -Uncontrolled -Current  treatment  Nitroglycerin 0.4 mg 1 tablet as needed - Appropriate, Effective, Safe, Accessible -Medications previously tried: none  -Recommended to continue current medication Recommended verifying expiration date on nitroglycerin.  Diabetes (A1c goal <6.5%) -Controlled -Current medications: Glipizide 5 mg 1 tablet twice daily - Appropriate, Query effective, Query Safe, Accessible -Medications previously tried: none  -Current home glucose readings:  fasting glucose: 62 (this morning), 128 post prandial glucose: 202 -Denies hypoglycemic/hyperglycemic symptoms -Current meal patterns:  breakfast: n/a  lunch: n/a  dinner: n/a snacks: n/a drinks: n/a -Current exercise: none -Educated on A1c and blood sugar goals; -Counseled to check feet daily and get yearly eye exams -Counseled on diet and exercise extensively   Atrial Fibrillation (Goal: prevent stroke and major bleeding) -Controlled -CHADSVASC: 6 -Current treatment: Rate control: Diltiazem 120 mg 1 capsule daily - Appropriate, Effective, Safe, Accessible Anticoagulation: Eliquis 5 mg 1 tablet twice daily - Appropriate, Effective, Safe, Accessible -Medications previously tried: none -Home BP and HR readings: refer to above  -Counseled on increased risk of stroke due to Afib and benefits of anticoagulation for stroke prevention; bleeding risk associated with Eliquis and importance of self-monitoring for signs/symptoms of bleeding; avoidance of NSAIDs due to increased bleeding risk with anticoagulants; -Recommended to continue current medication  GERD (Goal: minimize symptoms) -Controlled -Current treatment  Omeprazole 40 mg 1 capsule daily - in AM  - Appropriate, Effective, Safe, Accessible -Medications previously tried: none  -Counseled on non-pharmacologic management of symptoms such as elevating the head of your bed, avoiding eating 2-3 hours before bed, avoiding triggering foods such as acidic, spicy, or fatty foods,  eating smaller meals, and wearing clothes that  are loose around the waist Recommended taking before food.  Pain (Goal: minimize pain) -Uncontrolled -Current treatment  Oxycodone 30 mg 1 tablet as needed for pain - Appropriate, Effective, Query Safe, Accessible Gabapentin 100 mg 1 capsule three times daily - twice daily - Appropriate, Query effective, Safe, Accessible -Medications previously tried: n/a  -Counseled on risk for sedation with taking both of these medications and he makes sure to separate them.  Hypothyroidism (Goal: 0.35-4.5) -Uncontrolled -Current treatment  Levothyroxine 200 mcg 1 tablet daily - Appropriate, Effective, Query Safe, Accessible -Medications previously tried: none  - Consider dose decrease based on lower TSH.  Restless legs syndrome (Goal: minimize symptoms) -Uncontrolled -Current treatment  Ropinirole 5 mg 1 tablet three times daily - Appropriate, Query effective, Safe, Accessible -Medications previously tried: pramipexole (not effective) -Counseled on limiting caffeine intake. Recommended trial of increased gabapentin dose to help with RLS symptoms.  Onchomycosis (Goal: cure fungus) -Controlled -Current treatment  None -Medications previously tried: terbinafine -Recommended to continue current medication   Health Maintenance -Vaccine gaps: shingles, COVID, influenza, PCV20 -Current therapy:  Fluticasone 50 mcg as needed Vitamin C daily as needed Diclofenac gel 1% as needed Vitamin B12  (2 months)  Magnesium  -Educated on Cost vs benefit of each product must be carefully weighed by individual consumer -Patient is satisfied with current therapy and denies issues -Recommended to continue current medication  Patient Goals/Self-Care Activities Patient will:  - focus on medication adherence by setting alarms check glucose weekly, document, and provide at future appointments check blood pressure at least weekly, document, and provide at future  appointments target a minimum of 150 minutes of moderate intensity exercise weekly  Follow Up Plan: The care management team will reach out to the patient again over the next 7 days.      Patient Care Plan: RN Care Manager Plan of Care     Problem Identified: Chronic Disease Management and Care Coordination Needs (HF, CAD, Atrial Fib,HTN, HLD, chronic pain)   Priority: High     Long-Range Goal: Establish Plan of Care for Chronic Disease Management Needs (HF, CAD, Atrial Fib,HTN, HLD, chronic pain)   Start Date: 06/09/2021  Expected End Date: 12/14/2022  Recent Progress: On track  Priority: High  Note:   Current Barriers:  Knowledge Deficits related to plan of care for management of Atrial Fibrillation, CHF, CAD, HTN, HLD, Osteoarthritis, and chronic pain Chronic Disease Management support and education needs related to Atrial Fibrillation, CHF, CAD, HTN, HLD, Osteoarthritis, and chronic pain  States he is taking his torsemide as ordered and his weight is down to 211. States he still has been having some pain in his lt chest that he has decided is not angina but a gas bubble and it is relieved by drinking a small soda that makes him belch   States he talks to his girlfriend everyday and that has helped his mood  Denies any changes in his chest pains or increase in his shortness of breath.  States he tries to weighing daily. States his he is checking his B/P daily and they have been good.  States his CBG range in the 120s not fasting.  States Dr. Sarajane Jews started him on a sugar pill.  Denies any low blood sugars States he is not eating out as much and is trying to watch the salt  States  he is trying to drink more water and he is drinking decaffeinated coffee black now. States he still has his  back pain and restless  leg pain at night.  States he is sleeping better since he is taking 1 gabapentin in the morning and 2 at night/ States he is getting his medications from the [place] and [facility] without any  problems  RNCM Clinical Goal(s):  Patient will verbalize understanding of plan for management of Atrial Fibrillation, CHF, CAD, HTN, HLD, Osteoarthritis, and verbalize understanding of plan for management of Atrial Fibrillation, CHF, CAD, HTN, HLD, Osteoarthritis, and chronic pain as evidenced by voiced adherence to plan of care verbalize basic understanding of  Atrial Fibrillation, CHF, CAD, HTN, HLD, Osteoarthritis, and verbalize understanding of plan for management of Atrial Fibrillation, CHF, CAD, HTN, HLD, Osteoarthritis, and chronic pain disease process and self health management plan as evidenced by voiced understanding and teach back take all medications exactly as prescribed and will call provider for medication related questions as evidenced by dispense report and pt verbalization attend all scheduled medical appointments:  CCM PharmD 12/29/21, Annual Wellness visit 08/31/22 as evidenced by medical records demonstrate Improved adherence to prescribed treatment plan for Atrial Fibrillation, CHF, CAD, HTN, HLD, Osteoarthritis, and chronic pain as evidenced by readings within limits, voices adherence with plan of care continue to work with RN Care Manager to address care management and care coordination needs related to  Atrial Fibrillation, CHF, CAD, HTN, HLD, Osteoarthritis, and chronic pain as evidenced by adherence to CM Team Scheduled appointments through collaboration with RN Care manager, provider, and care team.   Interventions: 1:1 collaboration with primary care provider regarding development and update of comprehensive plan of care as evidenced by provider attestation and co-signature Inter-disciplinary care team collaboration (see longitudinal plan of care) Evaluation of current treatment plan related to  self management and patient's adherence to plan as established by provider    AFIB Interventions: (Status:  Goal on track:  Yes.) Long Term Goal   Counseled on increased risk of  stroke due to Afib and benefits of anticoagulation for stroke prevention Reviewed importance of adherence to anticoagulant exactly as prescribed Counseled on bleeding risk associated with Eliquis and importance of self-monitoring for signs/symptoms of bleeding Counseled on avoidance of NSAIDs due to increased bleeding risk with anticoagulants Counseled on seeking medical attention after a head injury or if there is blood in the urine/stool Reinforced fall precautions and safety    CAD Interventions: (Status:  Goal on track:  Yes.) Long Term Goal Assessed understanding of CAD diagnosis Medications reviewed including medications utilized in CAD treatment plan Provided education on importance of blood pressure control in management of CAD Provided education on Importance of limiting foods high in cholesterol Reviewed Importance of taking all medications as prescribed Advised to report any changes in symptoms or exercise tolerance Reviewed to use NTG for chest pains as ordered. Reinforced to calll 911 for chest pains and when to notify his provider   Heart Failure Interventions:  (Status:  Goal on track:  Yes.) Long Term Goal Basic overview and discussion of pathophysiology of Heart Failure reviewed Provided education on low sodium diet Reviewed Heart Failure Action Plan in depth and provided written copy Discussed importance of daily weight and advised patient to weigh and record daily Reviewed role of diuretics in prevention of fluid overload and management of heart failure; Discussed the importance of keeping all appointments with provider Reviewed to continue to weight daily and when to call provider. Reinforced to not miss doses of his torsemide and how taking will help with his shortness of breath  Hyperlipidemia Interventions:  (Status:  Condition stable.  Not addressed this visit.) Long Term Goal Medication review performed; medication list updated in electronic medical record.   Provider established cholesterol goals reviewed Reviewed importance of limiting foods high in cholesterol Reinforced to try to avoid fried foods when eating out  Hypertension Interventions:  (Status:  Goal on track:  Yes.) Long Term Goal Last practice recorded BP readings:  BP Readings from Last 3 Encounters:  11/21/21 120/80  10/10/21 106/64  08/29/21 124/76  Most recent eGFR/CrCl:  Lab Results  Component Value Date   EGFR 63 07/20/2021    No components found for: CRCL  Evaluation of current treatment plan related to hypertension self management and patient's adherence to plan as established by provider Provided education to patient re: stroke prevention, s/s of heart attack and stroke Advised patient, providing education and rationale, to monitor blood pressure daily and record, calling PCP for findings outside established parameters Provided education on prescribed diet low sodium heart healthy Reinforced to try to  do strength exercises   Pain Interventions:  (Status:  Goal on track:  Yes.) Long Term Goal Pain assessment performed Medications reviewed Reviewed provider established plan for pain management Discussed importance of adherence to all scheduled medical appointments Counseled on the importance of reporting any/all new or changed pain symptoms or management strategies to pain management provider Advised patient to report to care team affect of pain on daily activities Discussed use of relaxation techniques and/or diversional activities to assist with pain reduction (distraction, imagery, relaxation, massage, acupressure, TENS, heat, and cold application Reviewed with patient prescribed pharmacological and nonpharmacological pain relief strategies Reinforced safety precautions to help prevent falls when unsteady and to use cane when walking     Diabetes Interventions:  (Status:  New goal.) Long Term Goal Assessed patient's understanding of A1c goal: <7% Provided  education to patient about basic DM disease process Reviewed medications with patient and discussed importance of medication adherence Counseled on importance of regular laboratory monitoring as prescribed Provided patient with written educational materials related to hypo and hyperglycemia and importance of correct treatment Advised patient, providing education and rationale, to check cbg daily and record, calling provider for findings outside established parameters Lab Results  Component Value Date   HGBA1C 6.3 11/21/2021    Patient Goals/Self-Care Activities: Take all medications as prescribed Attend all scheduled provider appointments Call pharmacy for medication refills 3-7 days in advance of running out of medications Perform all self care activities independently  Perform IADL's (shopping, preparing meals, housekeeping, managing finances) independently Call provider office for new concerns or questions  call office if I gain more than 2 pounds in one day or 5 pounds in one week keep legs up while sitting watch for swelling in feet, ankles and legs every day weigh myself daily follow rescue plan if symptoms flare-up eat more whole grains, fruits and vegetables, lean meats and healthy fats check blood sugar at prescribed times: once daily and when you have symptoms of low or high blood sugar drink 6 to 8 glasses of water each day manage portion size switch to sugar-free drinks check pulse (heart) rate once a day make a plan to eat healthy take medicine as prescribed check blood pressure daily write blood pressure results in a log or diary take medications for blood pressure exactly as prescribed limit salt intake to 2038m/day call for medicine refill 2 or 3 days before it runs out take all medications exactly as prescribed call doctor with any symptoms you believe are related to your medicine -  learn relaxation techniques - practice acceptance of chronic pain - practice  relaxation or meditation daily - tell myself I can (not I can't) - think of new ways to do favorite things - use distraction techniques - use relaxation during pain Follow Up Plan:  Telephone follow up appointment with care management team member scheduled for:  01/16/22 The patient has been provided with contact information for the care management team and has been advised to call with any health related questions or concerns.          Patient verbalizes understanding of instructions and care plan provided today and agrees to view in [place]. Active MyChart status and patient understanding of how to access instructions and care plan via MyChart confirmed with patient.    The pharmacy team will reach out to the patient again over the next 7 days.   Viona Gilmore, [facility]

## 2021-12-29 NOTE — Telephone Encounter (Signed)
Potassium solution sent to [facility]

## 2021-12-29 NOTE — Telephone Encounter (Signed)
-----   Message from Viona Gilmore, [facility] sent at 12/29/2021 12:44 PM EDT ----- Regarding: Potassium question Hi,  Mr. Busler is having a lot of difficulty swallowing the potassium chloride tablets and is sometimes choking on them. Would it be possible to switch him to the solution instead? If so, can you send this to [facility] for him?   Thank you!  Maddie

## 2022-01-01 ENCOUNTER — Telehealth: Payer: Self-pay | Admitting: Pharmacist

## 2022-01-01 NOTE — Telephone Encounter (Signed)
-----   Message from [name], MD sent at 12/29/2021 12:48 PM EDT ----- Regarding: RE: DM med and RLS regimen Yes please try him on some renal dose Farxiga. For the restless legs, make an OV for him  ----- Message ----- From: Viona Gilmore, [facility] Sent: 12/29/2021  10:24 AM EDT To: [name], MD Subject: DM med and RLS regimen                         Hi,  Mr. Huegel reports the glipizide is making him really tired and he has had a few documented lows with it and wants to switch to something else. He will likely qualify for patient assistance for anything. I think he could benefit from either Iran (ASCVD benefits, weight loss, and with his recent dip in kidney function) or Rybelsus (ASCVD benefits and weight loss). What do you think about trying one of these? I was thinking of sampling him first to see how he tolerates either one.  Also his ropinirole and gabapentin do not seem to be helping with his RLS and he has frequent symptoms and takes ropinirole sometimes every 2 hours. Do you want me to have him set up an appointment to discuss with you?  I think he may benefit from a higher dose of gabapentin.  Let me know your thoughts! Maddie

## 2022-01-01 NOTE — Telephone Encounter (Signed)
Refer to discussion with PCP. Plan to stop glipizide and remove from medication list

## 2022-01-01 NOTE — Chronic Care Management (AMB) (Signed)
Chronic Care Management Pharmacy Assistant   Name: [name]  MRN: [id number] DOB: 18-Jun-1941  Reason for Encounter: Medication Review / Medication Coordination Call   Conditions to be addressed/monitored: DMII  Recent office visits:  None  Recent consult visits:  None  Hospital visits:  None  Medications: Outpatient Encounter Medications as of 01/01/2022  Medication Sig   apixaban (ELIQUIS) 5 MG TABS tablet Take 1 tablet (5 mg total) by mouth 2 (two) times daily.   Ascorbic Acid (VITAMIN C PO) Take 4 tablets by mouth daily as needed (flu like symptopms).    clotrimazole (LOTRIMIN) 1 % cream Apply 1 application topically daily.   diclofenac sodium (VOLTAREN) 1 % GEL Apply 1 application topically daily as needed (ankle pain).   diltiazem (CARDIZEM CD) 120 MG 24 hr capsule TAKE 1 CAPSULE BY MOUTH EVERY DAY   fluticasone (FLONASE) 50 MCG/ACT nasal spray Place 1 spray into both nostrils daily as needed for allergies or rhinitis.   gabapentin (NEURONTIN) 100 MG capsule TAKE 1 CAPSULE BY MOUTH THREE TIMES A DAY   glipiZIDE (GLUCOTROL) 5 MG tablet Take 1 tablet (5 mg total) by mouth 2 (two) times daily before a meal.   isosorbide mononitrate (IMDUR) 60 MG 24 hr tablet Take 1 tablet (60 mg total) by mouth daily.   ketoconazole (NIZORAL) 2 % cream Apply 1 application topically 2 (two) times daily as needed for irritation.   levothyroxine (SYNTHROID) 200 MCG tablet Take 1 tablet (200 mcg total) by mouth daily.   magnesium oxide (MAG-OX) 400 MG tablet Take 400 mg by mouth daily as needed (restless leg).   neomycin-polymyxin-hydrocortisone (CORTISPORIN) 3.5-10000-1 OTIC suspension Place 4 drops into the left ear 4 (four) times daily.   nitroGLYCERIN (NITROSTAT) 0.4 MG SL tablet Place 1 tablet (0.4 mg total) under the tongue every 5 (five) minutes as needed for chest pain.   omeprazole (PRILOSEC) 40 MG capsule Take 1 capsule (40 mg total) by mouth daily.   [START ON 01/31/2022]  oxycodone (ROXICODONE) 30 MG immediate release tablet Take 1 tablet (30 mg total) by mouth every 6 (six) hours as needed for pain.   potassium chloride 20 MEQ/15ML (10%) SOLN Take 15 mLs (20 mEq total) by mouth daily.   ropinirole (REQUIP) 5 MG tablet Take 1 tablet (5 mg total) by mouth in the morning, at noon, and at bedtime.   torsemide (DEMADEX) 20 MG tablet Take 2 tablets by mouth daily X's 3 days then reduce to taking 1 tablet by mouth on Mondays, Wednesdays, & Fridays   vitamin B-12 (CYANOCOBALAMIN) 100 MCG tablet Take 100 mcg by mouth daily.   No facility-administered encounter medications on file as of 01/01/2022.  Reviewed chart for medication changes ahead of medication coordination call.  No OVs, Consults, or hospital visits since last care coordination call/Pharmacist visit. (If appropriate, list visit date, provider name)  No medication changes indicated OR if recent visit, treatment plan here.  BP Readings from Last 3 Encounters:  11/21/21 120/80  10/10/21 106/64  08/29/21 124/76    Lab Results  Component Value Date   HGBA1C 6.3 11/21/2021     Patient obtains medications through Adherence Packaging  30 Days    Last adherence delivery included: Diltiazem 120 mg - 1 tablet at breakfast Gabapentin 100 mg - 1 tablet at breakfast and 2 at bedtime  Vitamin B12 500 mcg - 1 tablet at breakfast and bedtime Klor-con 20 meq - 1 tablet at breakfast Torsemide 20 mg - 1  tablet at breakfast on Mon, Wed and Fri Levothyroxine 200 mcg - 1 tablet before breakfast Isosorbide 60 mg - 1 tablet at breakfast Glipizide 5 mg - 1 tablet at breakfast and dinner Nitroglycerin 0.4 mg - 1 tablet under tongue every 5 min as needed for chest pain Apixaban 5 mg - take 1 tablet at breakfast and bedtime (new med being added to order) (rx requested from [place] at Dr. Camillia Herter)   Patient declined last month:    Omeprazole 40 mg - 1 tablet at breakfast - patient has plenty on hand   Patient is due for  next adherence delivery on: 01/12/2022   Called patient and reviewed medications and coordinated delivery.   This delivery to include: Diltiazem 120 mg - 1 tablet at breakfast Gabapentin 100 mg - 1 tablet at breakfast and 2 at bedtime  Vitamin B12 500 mcg - 1 tablet at breakfast and bedtime Torsemide 20 mg - 1 tablet at breakfast on Mon, Wed and Fri Levothyroxine 200 mcg - 1 tablet before breakfast Isosorbide 60 mg - 1 tablet at breakfast Nitroglycerin 0.4 mg - 1 tablet under tongue every 5 min as needed for chest pain Apixaban 5 mg - take 1 tablet at breakfast and bedtime  Does patient need filled: Omeprazole 40 mg - 1 tablet at breakfast   Notes: D/C Glipizide, start Wilder Glade will need to pick up samples at office. Change Potassium to 2 of the '10mg'$  tablets as the liquid will cost >150. Choose appt time with Sarajane Jews to discuss RLS options.   Patient will need a short fill: No short fill needed   Coordinated acute fill: No acute fill needed   Patient declined the following medications:     Unable to reach patient after several attempts, unable to verify delivery date of 01/12/2022     Care Gaps: AWV - scheduled 08/31/2022 Last BP - 120/80 on 11/21/2021 Last A1C - 6.3 on 11/21/2021 Covid vaccine - never done Foot exam - never done Eye exam - never done Malb - never done Shingrix - never done Pneumonia vaccine - postponed  Star Rating Drugs: Glipizide 5 mg - last filled 12/10/2021 30 DS at [facility] [phone]

## 2022-01-05 NOTE — Chronic Care Management (AMB) (Cosign Needed)
Spoke with patient, he was advised of discontinuing Glipizide and starting [place].  He was advised to make an office visit with Dr. Sarajane Jews to review RLS.  Patient states clear understanding and plans to go to the office today to pick up Gardner and schedule appointment with Dr. Sarajane Jews.  Patient was also notified of the cost of liquid potassium, he has declined this and is aware we will have him take Potassium 10 mg. 2 daily, advised these are smaller pills,  patient agrees.

## 2022-01-05 NOTE — Progress Notes (Signed)
Remote pacemaker transmission.   

## 2022-01-08 ENCOUNTER — Other Ambulatory Visit: Payer: Self-pay

## 2022-01-08 ENCOUNTER — Ambulatory Visit: Payer: Medicare HMO | Admitting: Family Medicine

## 2022-01-08 MED ORDER — DAPAGLIFLOZIN PROPANEDIOL 10 MG PO TABS: 10.0000 mg | ORAL_TABLET | Freq: Every day | ORAL | 0 refills | Status: DC

## 2022-01-10 ENCOUNTER — Encounter: Payer: Self-pay | Admitting: Family Medicine

## 2022-01-10 ENCOUNTER — Ambulatory Visit (INDEPENDENT_AMBULATORY_CARE_PROVIDER_SITE_OTHER): Payer: Medicare HMO | Admitting: Family Medicine

## 2022-01-10 VITALS — BP 118/72 | HR 52 | Temp 98.9°F | Wt 214.0 lb

## 2022-01-10 DIAGNOSIS — R590 Localized enlarged lymph nodes: Secondary | ICD-10-CM | POA: Diagnosis not present

## 2022-01-10 DIAGNOSIS — G2581 Restless legs syndrome: Secondary | ICD-10-CM

## 2022-01-10 DIAGNOSIS — E1140 Type 2 diabetes mellitus with diabetic neuropathy, unspecified: Secondary | ICD-10-CM | POA: Diagnosis not present

## 2022-01-10 DIAGNOSIS — M5387 Other specified dorsopathies, lumbosacral region: Secondary | ICD-10-CM

## 2022-01-10 MED ORDER — ROPINIROLE HCL 5 MG PO TABS: 10.0000 mg | ORAL_TABLET | Freq: Three times a day (TID) | ORAL | 1 refills | Status: DC

## 2022-01-10 NOTE — Progress Notes (Signed)
   Subjective:    Patient ID: [name], male    DOB: 06-Aug-1940, 81 y.o.   MRN: [id number]  HPI Here for several concerns. First his restless leg syndrome has gotten worse, and he often has to take 5 or 6 pills of Ropinirole to get them to quiet down. Second he has had numbness in the entire right leg from the buttock to the foot for about 4 weeks. His low back pain has been getting worse as well. His last lumbar MRI in 2017 showed mild to moderate spinal stenosis at several levels. I suspect that this has gotten worse. Third he has had a painful lump above his left collarbone for about 6 weeks. He denies any new coughing or SOB lately which is beyond his baseline. Lastly he had taken home some samples of Farxiga to try for his diabetes, but he says he is worried about possible side effects and he will not take them. His last A1c on 11-21-21 was 6.3% and his am fasting glucoses have been around 100.    Review of Systems  Constitutional: Negative.   Respiratory: Negative.    Cardiovascular: Negative.   Musculoskeletal:  Positive for back pain.  Neurological:  Positive for numbness.       Objective:   Physical Exam Constitutional:      Appearance: Normal appearance.     Comments: Walks with a cane   Cardiovascular:     Rate and Rhythm: Normal rate and regular rhythm.     Pulses: Normal pulses.     Heart sounds: Normal heart sounds.  Pulmonary:     Effort: Pulmonary effort is normal.     Breath sounds: Normal breath sounds.     Comments: There is a single 2 cm mobile tender mass above the left clavicle consistent with a lymph node Musculoskeletal:     Comments: Tender in the lower back with reduced ROM and positive SLR on both sides   Neurological:     Mental Status: He is alert.           Assessment & Plan:  First, his restless legs have worsened so we will increase the Ropinirole to where he can take up to 6 pills a day as needed. The numb right leg is likely from  sciatica, so we will set up another lumber MRI soon. His diabetes is well controlled so I agreed that he does not need [place]. Finally he has a supraclavicular node that is worrisome, so we will set up a contrasted chest CT soon.  [name], MD

## 2022-01-12 ENCOUNTER — Telehealth: Payer: Medicare HMO

## 2022-01-12 ENCOUNTER — Telehealth: Payer: Self-pay | Admitting: Family Medicine

## 2022-01-12 NOTE — Telephone Encounter (Signed)
I sent a new order for the MRI and sent it to [facility]

## 2022-01-12 NOTE — Telephone Encounter (Signed)
Called patient aware new MRI referral was sent.

## 2022-01-16 ENCOUNTER — Ambulatory Visit: Payer: Medicare HMO

## 2022-01-16 DIAGNOSIS — I4892 Unspecified atrial flutter: Secondary | ICD-10-CM

## 2022-01-16 DIAGNOSIS — E1140 Type 2 diabetes mellitus with diabetic neuropathy, unspecified: Secondary | ICD-10-CM

## 2022-01-16 DIAGNOSIS — I2510 Atherosclerotic heart disease of native coronary artery without angina pectoris: Secondary | ICD-10-CM

## 2022-01-16 DIAGNOSIS — I10 Essential (primary) hypertension: Secondary | ICD-10-CM

## 2022-01-16 DIAGNOSIS — E785 Hyperlipidemia, unspecified: Secondary | ICD-10-CM
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
## 2022-01-17 ENCOUNTER — Telehealth: Payer: Self-pay | Admitting: Cardiovascular Disease

## 2022-01-17 NOTE — Telephone Encounter (Signed)
Called patient and confirmed he has supply of nitroglycerin.  Adv to call EMS if symptoms worsen or not relieved by nitro. Pt states he will not call EMS due to previous experiences but that he will find someone to drive him if needed.  Confirmed he is aware of appointment time.

## 2022-01-17 NOTE — Telephone Encounter (Signed)
Pt c/o of Chest Pain: STAT if CP now or developed within 24 hours  1. Are you having CP right now? no  2. Are you experiencing any other symptoms (ex. SOB, nausea, vomiting, sweating)? sob  3. How long have you been experiencing CP? month  4. Is your CP continuous or coming and going? It mostly at night but its starting to happen during  5. Have you taken Nitroglycerin? Yes   Schd the patient for 7/3 [name] 8:50  ?

## 2022-01-19 DIAGNOSIS — E785 Hyperlipidemia, unspecified: Secondary | ICD-10-CM

## 2022-01-19 DIAGNOSIS — E039 Hypothyroidism, unspecified: Secondary | ICD-10-CM | POA: Diagnosis not present

## 2022-01-19 DIAGNOSIS — I4891 Unspecified atrial fibrillation: Secondary | ICD-10-CM

## 2022-01-19 DIAGNOSIS — I509 Heart failure, unspecified: Secondary | ICD-10-CM

## 2022-01-19 DIAGNOSIS — Z7984 Long term (current) use of oral hypoglycemic drugs: Secondary | ICD-10-CM

## 2022-01-19 DIAGNOSIS — I110 Hypertensive heart disease with heart failure: Secondary | ICD-10-CM | POA: Diagnosis not present

## 2022-01-19 DIAGNOSIS — E1159 Type 2 diabetes mellitus with other circulatory complications: Secondary | ICD-10-CM

## 2022-01-19 DIAGNOSIS — I2510 Atherosclerotic heart disease of native coronary artery without angina pectoris: Secondary | ICD-10-CM

## 2022-01-21 NOTE — H&P (View-Only) (Signed)
Cardiology Office Note:    Date:  01/22/2022   ID:  [name], DOB [birth year], MRN [id number]  PCP:  [name], MD  [facility] Cardiologist:  [name], MD Electrophysiologist:  Virl Axe, MD     Referring MD: [name], MD   Chief Complaint:  Chest Pain    Patient Profile: Coronary artery disease  S/p DES to LAD and DES to RCA in 10/2014 Hx of shortness of breath due to Brilinta  (HFpEF) heart failure with preserved ejection fraction  Sinus node dysfunction s/p Pacemaker  Paroxsymal atrial fibrillation/flutter  Hypertension  Intol of Amlodipine due to edema  Hyperlipidemia  Intol of statins Declined ezetimibe; declined Lipid Clinic referral  Pre-diabetes  PVCs/PACs OSA  Hx of GI bleed due to AVM  Prior CV Studies: Echocardiogram 08/07/21 EF 50-55, mild LVH, Gr 1 DD, normal RVSF, AV sclerosis w/o AS, mild dilastion of aortic root (40 mm), mild dilation of ascending aorta (39 mm)  Myoview 01/20/2019 No ischemia or infarction, EF 56; low risk   Cardiac catheterization 11/26/2014 RCA prox 20, mid 40, mid to dist 30, dist stent patent LCx mid 30 LAD ost 50, mid stent patent, dist 25 Normal EF   History of Present Illness:   [name] is a 81 y.o. male with the above problem list.  He was last seen in clinic by Ambrose Pancoast, NP in March 2023.  He recently called in with symptoms of chest pain.  He is seen for further evaluation.  He is here alone. Over the last several mos, he notes exertional chest pain and shortness of breath. He has symptoms with just minimal activity.  He sometimes has chest pain at rest. He has to take NTG sometimes to improve his symptoms.  He sleeps on an incline chronically.  He has been awoken by shortness of breath at night as well as chest pain and takes NTG for relief.  He has some LE edema.  This is fairly chronic but overall is improved.  He has not had syncope but has had some near syncope.     He has several different QRS morphologies on EKG with pacing today.  I had his device interrogated.  He has been in NSR with PACs and has intermittent AS VP and AV paced.  His device is functioning appropriately.          Past Medical History:  Diagnosis Date   Adenomatous polyp of colon 2007   Arthritis    Atrial flutter ([place])    AVM (arteriovenous malformation) 2011   a. S/p argon plasma coagulation and ablation in 2011, 2017.   Bradycardia    a. H/o almost 7sec pause nocturnally during 2011 admission. Also has h/o fatigue with BB.   CAD (coronary artery disease)    a. Nonobst disease by cath 2009. b. s/p PCI 10/2014 with DES to [place], patent by relook 11/2014 (Brilinta changed to Plavix with improved sx).   Chronic diastolic CHF (congestive heart failure) (HCC)    Depression    Diverticulosis    Gastritis 2011   GERD (gastroesophageal reflux disease)    History of hiatal hernia    Hyperlipidemia    Hypertension    Hypothyroidism    Myocardial infarction ([place])    Obstructive sleep apnea    -no cpap use   Pre-diabetes    Premature atrial contractions Holter 2016   Presence of permanent cardiac pacemaker    PVC's (  premature ventricular contractions) Holter 2016   Statin intolerance    Transfusion history    several years ago -GI bleed   Current Medications: Current Meds  Medication Sig   apixaban (ELIQUIS) 5 MG TABS tablet Take 1 tablet (5 mg total) by mouth 2 (two) times daily.   Ascorbic Acid (VITAMIN C PO) Take 4 tablets by mouth daily as needed (flu like symptopms).    clotrimazole (LOTRIMIN) 1 % cream Apply 1 application topically daily.   diclofenac sodium (VOLTAREN) 1 % GEL Apply 1 application topically daily as needed (ankle pain).   diltiazem (CARDIZEM CD) 120 MG 24 hr capsule TAKE 1 CAPSULE BY MOUTH EVERY DAY   fluticasone (FLONASE) 50 MCG/ACT nasal spray Place 1 spray into both nostrils daily as needed for allergies or rhinitis.   gabapentin (NEURONTIN)  100 MG capsule TAKE 1 CAPSULE BY MOUTH THREE TIMES A DAY   isosorbide mononitrate (IMDUR) 60 MG 24 hr tablet Take 1 tablet (60 mg total) by mouth daily.   ketoconazole (NIZORAL) 2 % cream Apply 1 application topically 2 (two) times daily as needed for irritation.   levothyroxine (SYNTHROID) 200 MCG tablet Take 1 tablet (200 mcg total) by mouth daily.   magnesium oxide (MAG-OX) 400 MG tablet Take 400 mg by mouth daily as needed (restless leg).   neomycin-polymyxin-hydrocortisone (CORTISPORIN) 3.5-10000-1 OTIC suspension Place 4 drops into the left ear 4 (four) times daily.   nitroGLYCERIN (NITROSTAT) 0.4 MG SL tablet Place 1 tablet (0.4 mg total) under the tongue every 5 (five) minutes as needed for chest pain.   omeprazole (PRILOSEC) 40 MG capsule Take 1 capsule (40 mg total) by mouth daily.   [START ON 01/31/2022] oxycodone (ROXICODONE) 30 MG immediate release tablet Take 1 tablet (30 mg total) by mouth every 6 (six) hours as needed for pain.   potassium chloride 20 MEQ/15ML (10%) SOLN Take 15 mLs (20 mEq total) by mouth daily.   ropinirole (REQUIP) 5 MG tablet Take 2 tablets (10 mg total) by mouth in the morning, at noon, and at bedtime.   torsemide (DEMADEX) 20 MG tablet Take 2 tablets by mouth daily X's 3 days then reduce to taking 1 tablet by mouth on Mondays, Wednesdays, & Fridays   vitamin B-12 (CYANOCOBALAMIN) 100 MCG tablet Take 100 mcg by mouth daily.    Allergies:   Brilinta [ticagrelor], Colchicine, Metoprolol tartrate, Mirapex [pramipexole dihydrochloride], Shellfish allergy, Statins, Zolpidem, Coconut (cocos nucifera), Lasix [furosemide], Metoprolol, Rosuvastatin, Levaquin [levofloxacin], and Trazodone and nefazodone   Social History   Tobacco Use   Smoking status: Former    Packs/day: 2.00    Years: 50.00    Total pack years: 100.00    Types: Cigarettes    Quit date: 07/23/2002    Years since quitting: 19.5    Passive exposure: Past   Smokeless tobacco: Never  Vaping Use    Vaping Use: Never used  Substance Use Topics   Alcohol use: No    Alcohol/week: 0.0 standard drinks of alcohol   Drug use: No    Family Hx: The patient's family history includes Alcoholism in his maternal grandfather and paternal uncle; Heart attack in his father; Heart disease in his father; Leukemia in his mother; Stroke in his father. There is no history of Colon cancer or Esophageal cancer.  Review of Systems  Constitutional: Negative for fever.  Respiratory:  Positive for cough (chronic).   Gastrointestinal:  Negative for hematochezia and melena.  Genitourinary:  Negative for hematuria.  EKGs/Labs/Other Test Reviewed:    EKG:  EKG is   ordered today.  The ekg ordered today demonstrates NSR, 79, AS-VP and AV paced.    Recent Labs: 03/30/2021: TSH 0.12 07/31/2021: Magnesium 1.8 08/02/2021: Hemoglobin 12.5; Platelets 234 11/21/2021: BUN 16; Creatinine, Ser 1.24; Potassium 4.0; Sodium 138   Recent Lipid Panel No results for input(s): "CHOL", "TRIG", "HDL", "VLDL", "LDLCALC", "LDLDIRECT" in the last 8760 hours.   Risk Assessment/Calculations/Metrics:    CHA2DS2-VASc Score = 4   This indicates a 4.8% annual risk of stroke. The patient's score is based upon: CHF History: 0 HTN History: 1 Diabetes History: 0 Stroke History: 0 Vascular Disease History: 1 Age Score: 2 Gender Score: 0             Physical Exam:    VS:  BP 120/70   Pulse 79   Ht '5\' 9"'$  (1.753 m)   Wt 219 lb 9.6 oz (99.6 kg)   SpO2 94%   BMI 32.43 kg/m     Wt Readings from Last 3 Encounters:  01/22/22 219 lb 9.6 oz (99.6 kg)  01/10/22 214 lb (97.1 kg)  11/21/21 220 lb (99.8 kg)    Constitutional:      Appearance: Healthy appearance. Not in distress.  Neck:     Vascular: No JVR. JVD normal.  Pulmonary:     Effort: Pulmonary effort is normal.     Breath sounds: No wheezing. Rales (faint crackles at the bases bilaterally) present.  Cardiovascular:     Normal rate. Regular rhythm. Normal S1. Normal  S2.      Murmurs: There is no murmur.  Edema:    Peripheral edema present.    Pretibial: bilateral trace edema of the pretibial area. Abdominal:     Palpations: Abdomen is soft.  Skin:    General: Skin is warm and dry.  Neurological:     General: No focal deficit present.     Mental Status: Alert and oriented to person, place and time.          ASSESSMENT & PLAN:   Coronary artery disease History of prior stenting to the LAD and RCA in 2016.  Myoview in 2020 was low risk.  He presents with several months of exertional angina and shortness of breath.  He is paced on his electrocardiogram which makes it difficult to assess for ischemia.  His symptoms sound consistent with unstable angina.  I have recommended proceeding with right and left heart catheterization.  I reviewed this with Dr. Ali Lowe (attending MD) who agreed. Increase isosorbide to 90 mg daily He is not on aspirin as he is on Eliquis Hold Eliquis 2 days prior to procedure Continue diltiazem 120 mg daily He is intolerant to statins Proceed with right and left heart catheterization later this week I have advised him to go the emergency room if his symptoms worsen  (HFpEF) heart failure with preserved ejection fraction ([place]) He does note some symptoms that sound consistent with PND.  However, his weight is down and he does not have much edema on exam.  His neck veins are flat.  Does have some crackles in the bases.  I will give him 1 dose of extra torsemide.  I will obtain BNP with his labs today and adjust torsemide further if needed.  Proceed with right and left heart catheterization as noted. Take 1 extra dose of torsemide 20 mg tomorrow Proceed with right and left heart catheterization later this week  Hyperlipidemia LDL goal <70 He  is intolerant to statins and has declined therapy with ezetimibe as well as referral to lipid clinic.  Essential hypertension The patient's blood pressure is controlled on his current  regimen.  Continue current therapy.   Paroxysmal atrial flutter (HCC) He seems to be maintaining sinus rhythm.  Obtain CBC, CMET today.  Hold Eliquis for 2 days prior to cardiac catheterization.  Resume post catheterization  Sinus node dysfunction (HCC) Status post pacemaker.  As noted, his device was interrogated today.  It is functioning appropriately.  He notes that he has felt poorly since his pacemaker was implanted.  If his catheterization does not reveal any significant disease requiring PCI, it may be beneficial to get him back for earlier EP follow-up.        Shared Decision Making/Informed Consent The risks [stroke (1 in 1000), death (1 in 1000), kidney failure [usually temporary] (1 in 500), bleeding (1 in 200), allergic reaction [possibly serious] (1 in 200)], benefits (diagnostic support and management of coronary artery disease) and alternatives of a cardiac catheterization were discussed in detail with Mr. Preis and he is willing to proceed.   Dispo:  Return for Post Procedure Follow Up with Dr. [name].   Medication Adjustments/Labs and Tests Ordered: Current medicines are reviewed at length with the patient today.  Concerns regarding medicines are outlined above.  Tests Ordered: Orders Placed This Encounter  Procedures   Basic metabolic panel   CBC   Pro b natriuretic peptide (BNP)   EKG 12-Lead   Medication Changes: No orders of the defined types were placed in this encounter.  Signed, Richardson Dopp, PA-C  01/22/2022 9:33 AM    [facility] [place], [place], [place]  [address] Phone: [phone]; Fax: [phone]

## 2022-01-21 NOTE — Progress Notes (Signed)
Cardiology Office Note:    Date:  01/22/2022   ID:  [name], DOB [birth year], MRN [id number]  PCP:  [name], MD  [facility] Cardiologist:  [name], MD Electrophysiologist:  Virl Axe, MD     Referring MD: [name], MD   Chief Complaint:  Chest Pain    Patient Profile: Coronary artery disease  S/p DES to LAD and DES to RCA in 10/2014 Hx of shortness of breath due to Brilinta  (HFpEF) heart failure with preserved ejection fraction  Sinus node dysfunction s/p Pacemaker  Paroxsymal atrial fibrillation/flutter  Hypertension  Intol of Amlodipine due to edema  Hyperlipidemia  Intol of statins Declined ezetimibe; declined Lipid Clinic referral  Pre-diabetes  PVCs/PACs OSA  Hx of GI bleed due to AVM  Prior CV Studies: Echocardiogram 08/07/21 EF 50-55, mild LVH, Gr 1 DD, normal RVSF, AV sclerosis w/o AS, mild dilastion of aortic root (40 mm), mild dilation of ascending aorta (39 mm)  Myoview 01/20/2019 No ischemia or infarction, EF 56; low risk   Cardiac catheterization 11/26/2014 RCA prox 20, mid 40, mid to dist 30, dist stent patent LCx mid 30 LAD ost 50, mid stent patent, dist 25 Normal EF   History of Present Illness:   [name] is a 81 y.o. male with the above problem list.  He was last seen in clinic by Ambrose Pancoast, NP in March 2023.  He recently called in with symptoms of chest pain.  He is seen for further evaluation.  He is here alone. Over the last several mos, he notes exertional chest pain and shortness of breath. He has symptoms with just minimal activity.  He sometimes has chest pain at rest. He has to take NTG sometimes to improve his symptoms.  He sleeps on an incline chronically.  He has been awoken by shortness of breath at night as well as chest pain and takes NTG for relief.  He has some LE edema.  This is fairly chronic but overall is improved.  He has not had syncope but has had some near syncope.     He has several different QRS morphologies on EKG with pacing today.  I had his device interrogated.  He has been in NSR with PACs and has intermittent AS VP and AV paced.  His device is functioning appropriately.          Past Medical History:  Diagnosis Date   Adenomatous polyp of colon 2007   Arthritis    Atrial flutter ([place])    AVM (arteriovenous malformation) 2011   a. S/p argon plasma coagulation and ablation in 2011, 2017.   Bradycardia    a. H/o almost 7sec pause nocturnally during 2011 admission. Also has h/o fatigue with BB.   CAD (coronary artery disease)    a. Nonobst disease by cath 2009. b. s/p PCI 10/2014 with DES to [place], patent by relook 11/2014 (Brilinta changed to Plavix with improved sx).   Chronic diastolic CHF (congestive heart failure) (HCC)    Depression    Diverticulosis    Gastritis 2011   GERD (gastroesophageal reflux disease)    History of hiatal hernia    Hyperlipidemia    Hypertension    Hypothyroidism    Myocardial infarction ([place])    Obstructive sleep apnea    -no cpap use   Pre-diabetes    Premature atrial contractions Holter 2016   Presence of permanent cardiac pacemaker    PVC's (  premature ventricular contractions) Holter 2016   Statin intolerance    Transfusion history    several years ago -GI bleed   Current Medications: Current Meds  Medication Sig   apixaban (ELIQUIS) 5 MG TABS tablet Take 1 tablet (5 mg total) by mouth 2 (two) times daily.   Ascorbic Acid (VITAMIN C PO) Take 4 tablets by mouth daily as needed (flu like symptopms).    clotrimazole (LOTRIMIN) 1 % cream Apply 1 application topically daily.   diclofenac sodium (VOLTAREN) 1 % GEL Apply 1 application topically daily as needed (ankle pain).   diltiazem (CARDIZEM CD) 120 MG 24 hr capsule TAKE 1 CAPSULE BY MOUTH EVERY DAY   fluticasone (FLONASE) 50 MCG/ACT nasal spray Place 1 spray into both nostrils daily as needed for allergies or rhinitis.   gabapentin (NEURONTIN)  100 MG capsule TAKE 1 CAPSULE BY MOUTH THREE TIMES A DAY   isosorbide mononitrate (IMDUR) 60 MG 24 hr tablet Take 1 tablet (60 mg total) by mouth daily.   ketoconazole (NIZORAL) 2 % cream Apply 1 application topically 2 (two) times daily as needed for irritation.   levothyroxine (SYNTHROID) 200 MCG tablet Take 1 tablet (200 mcg total) by mouth daily.   magnesium oxide (MAG-OX) 400 MG tablet Take 400 mg by mouth daily as needed (restless leg).   neomycin-polymyxin-hydrocortisone (CORTISPORIN) 3.5-10000-1 OTIC suspension Place 4 drops into the left ear 4 (four) times daily.   nitroGLYCERIN (NITROSTAT) 0.4 MG SL tablet Place 1 tablet (0.4 mg total) under the tongue every 5 (five) minutes as needed for chest pain.   omeprazole (PRILOSEC) 40 MG capsule Take 1 capsule (40 mg total) by mouth daily.   [START ON 01/31/2022] oxycodone (ROXICODONE) 30 MG immediate release tablet Take 1 tablet (30 mg total) by mouth every 6 (six) hours as needed for pain.   potassium chloride 20 MEQ/15ML (10%) SOLN Take 15 mLs (20 mEq total) by mouth daily.   ropinirole (REQUIP) 5 MG tablet Take 2 tablets (10 mg total) by mouth in the morning, at noon, and at bedtime.   torsemide (DEMADEX) 20 MG tablet Take 2 tablets by mouth daily X's 3 days then reduce to taking 1 tablet by mouth on Mondays, Wednesdays, & Fridays   vitamin B-12 (CYANOCOBALAMIN) 100 MCG tablet Take 100 mcg by mouth daily.    Allergies:   Brilinta [ticagrelor], Colchicine, Metoprolol tartrate, Mirapex [pramipexole dihydrochloride], Shellfish allergy, Statins, Zolpidem, Coconut (cocos nucifera), Lasix [furosemide], Metoprolol, Rosuvastatin, Levaquin [levofloxacin], and Trazodone and nefazodone   Social History   Tobacco Use   Smoking status: Former    Packs/day: 2.00    Years: 50.00    Total pack years: 100.00    Types: Cigarettes    Quit date: 07/23/2002    Years since quitting: 19.5    Passive exposure: Past   Smokeless tobacco: Never  Vaping Use    Vaping Use: Never used  Substance Use Topics   Alcohol use: No    Alcohol/week: 0.0 standard drinks of alcohol   Drug use: No    Family Hx: The patient's family history includes Alcoholism in his maternal grandfather and paternal uncle; Heart attack in his father; Heart disease in his father; Leukemia in his mother; Stroke in his father. There is no history of Colon cancer or Esophageal cancer.  Review of Systems  Constitutional: Negative for fever.  Respiratory:  Positive for cough (chronic).   Gastrointestinal:  Negative for hematochezia and melena.  Genitourinary:  Negative for hematuria.  EKGs/Labs/Other Test Reviewed:    EKG:  EKG is   ordered today.  The ekg ordered today demonstrates NSR, 79, AS-VP and AV paced.    Recent Labs: 03/30/2021: TSH 0.12 07/31/2021: Magnesium 1.8 08/02/2021: Hemoglobin 12.5; Platelets 234 11/21/2021: BUN 16; Creatinine, Ser 1.24; Potassium 4.0; Sodium 138   Recent Lipid Panel No results for input(s): "CHOL", "TRIG", "HDL", "VLDL", "LDLCALC", "LDLDIRECT" in the last 8760 hours.   Risk Assessment/Calculations/Metrics:    CHA2DS2-VASc Score = 4   This indicates a 4.8% annual risk of stroke. The patient's score is based upon: CHF History: 0 HTN History: 1 Diabetes History: 0 Stroke History: 0 Vascular Disease History: 1 Age Score: 2 Gender Score: 0             Physical Exam:    VS:  BP 120/70   Pulse 79   Ht '5\' 9"'$  (1.753 m)   Wt 219 lb 9.6 oz (99.6 kg)   SpO2 94%   BMI 32.43 kg/m     Wt Readings from Last 3 Encounters:  01/22/22 219 lb 9.6 oz (99.6 kg)  01/10/22 214 lb (97.1 kg)  11/21/21 220 lb (99.8 kg)    Constitutional:      Appearance: Healthy appearance. Not in distress.  Neck:     Vascular: No JVR. JVD normal.  Pulmonary:     Effort: Pulmonary effort is normal.     Breath sounds: No wheezing. Rales (faint crackles at the bases bilaterally) present.  Cardiovascular:     Normal rate. Regular rhythm. Normal S1. Normal  S2.      Murmurs: There is no murmur.  Edema:    Peripheral edema present.    Pretibial: bilateral trace edema of the pretibial area. Abdominal:     Palpations: Abdomen is soft.  Skin:    General: Skin is warm and dry.  Neurological:     General: No focal deficit present.     Mental Status: Alert and oriented to person, place and time.          ASSESSMENT & PLAN:   Coronary artery disease History of prior stenting to the LAD and RCA in 2016.  Myoview in 2020 was low risk.  He presents with several months of exertional angina and shortness of breath.  He is paced on his electrocardiogram which makes it difficult to assess for ischemia.  His symptoms sound consistent with unstable angina.  I have recommended proceeding with right and left heart catheterization.  I reviewed this with Dr. Ali Lowe (attending MD) who agreed. Increase isosorbide to 90 mg daily He is not on aspirin as he is on Eliquis Hold Eliquis 2 days prior to procedure Continue diltiazem 120 mg daily He is intolerant to statins Proceed with right and left heart catheterization later this week I have advised him to go the emergency room if his symptoms worsen  (HFpEF) heart failure with preserved ejection fraction ([place]) He does note some symptoms that sound consistent with PND.  However, his weight is down and he does not have much edema on exam.  His neck veins are flat.  Does have some crackles in the bases.  I will give him 1 dose of extra torsemide.  I will obtain BNP with his labs today and adjust torsemide further if needed.  Proceed with right and left heart catheterization as noted. Take 1 extra dose of torsemide 20 mg tomorrow Proceed with right and left heart catheterization later this week  Hyperlipidemia LDL goal <70 He  is intolerant to statins and has declined therapy with ezetimibe as well as referral to lipid clinic.  Essential hypertension The patient's blood pressure is controlled on his current  regimen.  Continue current therapy.   Paroxysmal atrial flutter (HCC) He seems to be maintaining sinus rhythm.  Obtain CBC, CMET today.  Hold Eliquis for 2 days prior to cardiac catheterization.  Resume post catheterization  Sinus node dysfunction (HCC) Status post pacemaker.  As noted, his device was interrogated today.  It is functioning appropriately.  He notes that he has felt poorly since his pacemaker was implanted.  If his catheterization does not reveal any significant disease requiring PCI, it may be beneficial to get him back for earlier EP follow-up.        Shared Decision Making/Informed Consent The risks [stroke (1 in 1000), death (1 in 1000), kidney failure [usually temporary] (1 in 500), bleeding (1 in 200), allergic reaction [possibly serious] (1 in 200)], benefits (diagnostic support and management of coronary artery disease) and alternatives of a cardiac catheterization were discussed in detail with Mr. Shaff and he is willing to proceed.   Dispo:  Return for Post Procedure Follow Up with Dr. [name].   Medication Adjustments/Labs and Tests Ordered: Current medicines are reviewed at length with the patient today.  Concerns regarding medicines are outlined above.  Tests Ordered: Orders Placed This Encounter  Procedures   Basic metabolic panel   CBC   Pro b natriuretic peptide (BNP)   EKG 12-Lead   Medication Changes: No orders of the defined types were placed in this encounter.  Signed, Richardson Dopp, PA-C  01/22/2022 9:33 AM    [facility] [address], [place], [place]  [address] Phone: [phone]; Fax: [phone]

## 2022-01-22 ENCOUNTER — Ambulatory Visit: Payer: Medicare HMO | Admitting: Physician Assistant

## 2022-01-22 ENCOUNTER — Encounter: Payer: Self-pay | Admitting: Physician Assistant

## 2022-01-22 VITALS — BP 120/70 | HR 79 | Ht 69.0 in | Wt 219.6 lb

## 2022-01-22 DIAGNOSIS — I10 Essential (primary) hypertension: Secondary | ICD-10-CM

## 2022-01-22 DIAGNOSIS — E785 Hyperlipidemia, unspecified: Secondary | ICD-10-CM | POA: Diagnosis not present

## 2022-01-22 DIAGNOSIS — I5032 Chronic diastolic (congestive) heart failure: Secondary | ICD-10-CM | POA: Diagnosis not present

## 2022-01-22 DIAGNOSIS — I25110 Atherosclerotic heart disease of native coronary artery with unstable angina pectoris: Secondary | ICD-10-CM | POA: Diagnosis not present

## 2022-01-22 DIAGNOSIS — I495 Sick sinus syndrome: Secondary | ICD-10-CM | POA: Diagnosis not present

## 2022-01-22 DIAGNOSIS — I200 Unstable angina: Secondary | ICD-10-CM

## 2022-01-22 DIAGNOSIS — R0602 Shortness of breath: Secondary | ICD-10-CM | POA: Diagnosis not present

## 2022-01-22 DIAGNOSIS — I4892 Unspecified atrial flutter: Secondary | ICD-10-CM | POA: Diagnosis not present

## 2022-01-22 DIAGNOSIS — I25119 Atherosclerotic heart disease of native coronary artery with unspecified angina pectoris: Secondary | ICD-10-CM

## 2022-01-22 DIAGNOSIS — I5030 Unspecified diastolic (congestive) heart failure: Secondary | ICD-10-CM | POA: Insufficient documentation

## 2022-01-22 NOTE — Assessment & Plan Note (Signed)
He is intolerant to statins and has declined therapy with ezetimibe as well as referral to lipid clinic.

## 2022-01-22 NOTE — Assessment & Plan Note (Signed)
History of prior stenting to the LAD and RCA in 2016.  Myoview in 2020 was low risk.  He presents with several months of exertional angina and shortness of breath.  He is paced on his electrocardiogram which makes it difficult to assess for ischemia.  His symptoms sound consistent with unstable angina.  I have recommended proceeding with right and left heart catheterization.  I reviewed this with Dr. Ali Lowe (attending MD) who agreed.  Increase isosorbide to 90 mg daily  He is not on aspirin as he is on Eliquis  Hold Eliquis 2 days prior to procedure  Continue diltiazem 120 mg daily  He is intolerant to statins  Proceed with right and left heart catheterization later this week  I have advised him to go the emergency room if his symptoms worsen

## 2022-01-22 NOTE — Patient Instructions (Addendum)
Medication Instructions:  Your physician recommends that you continue on your current medications as directed. Please refer to the Current Medication list given to you today,  I would like for you to take a Torsemide tomorrow, Tuesday, 01/23/22, along with the Monday, Wednesday, & Friday dose, this week only!   *If you need a refill on your cardiac medications before your next appointment, please call your pharmacy*   Lab Work: TODAY:  BMET, CBC, & PRO BNP  If you have labs (blood work) drawn today and your tests are completely normal, you will receive your results only by: [place] (if you have MyChart) OR A paper copy in the mail If you have any lab test that is abnormal or we need to change your treatment, we will call you to review the results.   Testing/Procedures: Your physician has requested that you have a cardiac catheterization. Cardiac catheterization is used to diagnose and/or treat various heart conditions. Doctors may recommend this procedure for a number of different reasons. The most common reason is to evaluate chest pain. Chest pain can be a symptom of coronary artery disease (CAD), and cardiac catheterization can show whether plaque is narrowing or blocking your heart's arteries. This procedure is also used to evaluate the valves, as well as measure the blood flow and oxygen levels in different parts of your heart. For further information please visit [email or web address]. Please follow instruction sheet, BELOW:   [facility] OFF[facility] [place], [place] [address] Dept: [phone] Loc: [place]  01/22/2022  You are scheduled for a Cardiac Catheterization on Thursday, July 7 with Dr. [name].  1. Please arrive at the Main Entrance A at [facility]: [place], [place] [address] at 11:30 AM (This time is two hours  before your procedure to ensure your preparation). Free valet parking service is available.   Special note: Every effort is made to have your procedure done on time. Please understand that emergencies sometimes delay scheduled procedures.  2. Diet: Do not eat solid foods after midnight.  You may have clear liquids until 5 AM upon the day of the procedure.  3. Labs: You will need to have blood drawn on TODAY.  4. Medication instructions in preparation for your procedure:   Contrast Allergy: No   Stop taking Eliquis (Apixiban) on Monday, July 3. TONIGHT WILL BE YOUR LAST DOSE UNTIL AFTER YOUR PROCEDURE  Stop taking, Torsemide (Demadex) Thursday, July 6,   On the morning of your procedure, take Aspirin and any morning medicines NOT listed above.  You may use sips of water.  5. Plan to go home the same day, you will only stay overnight if medically necessary. 6. You MUST have a responsible adult to drive you home. 7. An adult MUST be with you the first 24 hours after you arrive home. 8. Bring a current list of your medications, and the last time and date medication taken. 9. Bring ID and current insurance cards. 10.Please wear clothes that are easy to get on and off and wear slip-on shoes.  Thank you for allowing [place] to care for you!   -- [facility]  Follow-Up: At [facility], you and your health needs are our priority.  As part of our continuing mission to provide you with exceptional heart care, we have created designated Provider Care Teams.  These Care Teams include your  primary Cardiologist (physician) and Advanced Practice Providers (APPs -  Physician Assistants and Nurse Practitioners) who all work together to provide you with the care you need, when you need it.  We recommend signing up for the patient portal called "MyChart".  Sign up information is provided on this After Visit Summary.  MyChart is used to connect with patients for Virtual  Visits (Telemedicine).  Patients are able to view lab/test results, encounter notes, upcoming appointments, etc.  Non-urgent messages can be sent to your provider as well.   To learn more about what you can do with MyChart, go to [email or web address].    Your next appointment:   2 week(s)  The format for your next appointment:   In Person  Provider:   [name], MD  or Richardson Dopp, PA-C         Other Instructions   Important Information About Sugar

## 2022-01-22 NOTE — Assessment & Plan Note (Signed)
He does note some symptoms that sound consistent with PND.  However, his weight is down and he does not have much edema on exam.  His neck veins are flat.  Does have some crackles in the bases.  I will give him 1 dose of extra torsemide.  I will obtain BNP with his labs today and adjust torsemide further if needed.  Proceed with right and left heart catheterization as noted.  Take 1 extra dose of torsemide 20 mg tomorrow  Proceed with right and left heart catheterization later this week

## 2022-01-22 NOTE — Assessment & Plan Note (Signed)
The patient's blood pressure is controlled on his current regimen.  Continue current therapy.  

## 2022-01-22 NOTE — Assessment & Plan Note (Signed)
He seems to be maintaining sinus rhythm.  Obtain CBC, CMET today.  Hold Eliquis for 2 days prior to cardiac catheterization.  Resume post catheterization

## 2022-01-22 NOTE — Assessment & Plan Note (Signed)
Status post pacemaker.  As noted, his device was interrogated today.  It is functioning appropriately.  He notes that he has felt poorly since his pacemaker was implanted.  If his catheterization does not reveal any significant disease requiring PCI, it may be beneficial to get him back for earlier EP follow-up.

## 2022-01-23 LAB — CBC
Hematocrit: 44.5 % (ref 37.5–51.0)
Hemoglobin: 15 g/dL (ref 13.0–17.7)
MCH: 29.9 pg (ref 26.6–33.0)
MCHC: 33.7 g/dL (ref 31.5–35.7)
MCV: 89 fL (ref 79–97)
Platelets: 281 10*3/uL (ref 150–450)
RBC: 5.01 x10E6/uL (ref 4.14–5.80)
RDW: 14.6 % (ref 11.6–15.4)
WBC: 10.9 10*3/uL — ABNORMAL HIGH (ref 3.4–10.8)

## 2022-01-23 LAB — BASIC METABOLIC PANEL
BUN/Creatinine Ratio: 14 (ref 10–24)
BUN: 12 mg/dL (ref 8–27)
CO2: 23 mmol/L (ref 20–29)
Calcium: 9.5 mg/dL (ref 8.6–10.2)
Chloride: 102 mmol/L (ref 96–106)
Creatinine, Ser: 0.88 mg/dL (ref 0.76–1.27)
Glucose: 106 mg/dL — ABNORMAL HIGH (ref 70–99)
Potassium: 4.7 mmol/L (ref 3.5–5.2)
Sodium: 140 mmol/L (ref 134–144)
eGFR: 87 mL/min/{1.73_m2} (ref >59)

## 2022-01-23 LAB — PRO B NATRIURETIC PEPTIDE: NT-Pro BNP: 116 pg/mL (ref 0–486)

## 2022-01-24 ENCOUNTER — Telehealth: Payer: Self-pay | Admitting: *Deleted

## 2022-01-24 NOTE — Telephone Encounter (Signed)
At request of [facility], procedure time moved to 7:30 AM, arrive 5:30 AM-pt aware and agrees with time change.

## 2022-01-24 NOTE — Telephone Encounter (Signed)
Cardiac Catheterization scheduled at [facility] for: Thursday January 25, 2022 1:30 PM Arrival time and place: [facility] Entrance A at: 11:30 AM   Nothing to eat after midnight prior to procedure, clear liquids until 5 AM day of procedure.  Medication instructions: -Hold:  Eliquis-none 01/22/22 until post procedure  Torsemide/KCl-AM of procedure -Except hold medications usual morning medications can be taken with sips of water including aspirin 81 mg.  Confirmed patient has responsible adult to drive home post procedure and be with patient first 24 hours after arriving home.  Patient reports no new symptoms concerning for COVID-19 in the past 10 days.  Reviewed procedure instructions with patient.

## 2022-01-25 ENCOUNTER — Observation Stay (HOSPITAL_COMMUNITY)
Admission: RE | Admit: 2022-01-25 | Discharge: 2022-01-26 | Disposition: A | Discharge status: Home / Self Care | Payer: Medicare HMO | Attending: Cardiovascular Disease | Admitting: Cardiovascular Disease

## 2022-01-25 ENCOUNTER — Ambulatory Visit (HOSPITAL_BASED_OUTPATIENT_CLINIC_OR_DEPARTMENT_OTHER): Payer: Medicare HMO

## 2022-01-25 ENCOUNTER — Encounter (HOSPITAL_COMMUNITY): Payer: Self-pay | Admitting: Cardiovascular Disease

## 2022-01-25 ENCOUNTER — Other Ambulatory Visit: Payer: Self-pay

## 2022-01-25 ENCOUNTER — Ambulatory Visit (HOSPITAL_COMMUNITY)
Admission: RE | Disposition: A | Discharge status: Home / Self Care | Payer: Self-pay | Source: Home / Self Care | Attending: Cardiovascular Disease

## 2022-01-25 DIAGNOSIS — E039 Hypothyroidism, unspecified: Secondary | ICD-10-CM | POA: Insufficient documentation

## 2022-01-25 DIAGNOSIS — I252 Old myocardial infarction: Secondary | ICD-10-CM | POA: Insufficient documentation

## 2022-01-25 DIAGNOSIS — I25119 Atherosclerotic heart disease of native coronary artery with unspecified angina pectoris: Secondary | ICD-10-CM | POA: Diagnosis present

## 2022-01-25 DIAGNOSIS — R0602 Shortness of breath: Secondary | ICD-10-CM

## 2022-01-25 DIAGNOSIS — Z950 Presence of cardiac pacemaker: Secondary | ICD-10-CM | POA: Diagnosis not present

## 2022-01-25 DIAGNOSIS — R079 Chest pain, unspecified: Secondary | ICD-10-CM | POA: Diagnosis present

## 2022-01-25 DIAGNOSIS — I4891 Unspecified atrial fibrillation: Secondary | ICD-10-CM | POA: Diagnosis present

## 2022-01-25 DIAGNOSIS — Z79899 Other long term (current) drug therapy: Secondary | ICD-10-CM | POA: Diagnosis not present

## 2022-01-25 DIAGNOSIS — I480 Paroxysmal atrial fibrillation: Secondary | ICD-10-CM | POA: Insufficient documentation

## 2022-01-25 DIAGNOSIS — I495 Sick sinus syndrome: Secondary | ICD-10-CM | POA: Diagnosis present

## 2022-01-25 DIAGNOSIS — I5032 Chronic diastolic (congestive) heart failure: Secondary | ICD-10-CM | POA: Diagnosis not present

## 2022-01-25 DIAGNOSIS — Z7901 Long term (current) use of anticoagulants: Secondary | ICD-10-CM | POA: Insufficient documentation

## 2022-01-25 DIAGNOSIS — I5030 Unspecified diastolic (congestive) heart failure: Secondary | ICD-10-CM | POA: Diagnosis present

## 2022-01-25 DIAGNOSIS — I25110 Atherosclerotic heart disease of native coronary artery with unstable angina pectoris: Secondary | ICD-10-CM | POA: Diagnosis not present

## 2022-01-25 DIAGNOSIS — E785 Hyperlipidemia, unspecified: Secondary | ICD-10-CM | POA: Diagnosis present

## 2022-01-25 DIAGNOSIS — I2510 Atherosclerotic heart disease of native coronary artery without angina pectoris: Secondary | ICD-10-CM | POA: Diagnosis not present

## 2022-01-25 DIAGNOSIS — I200 Unstable angina: Secondary | ICD-10-CM | POA: Diagnosis present

## 2022-01-25 DIAGNOSIS — I10 Essential (primary) hypertension: Secondary | ICD-10-CM | POA: Diagnosis present

## 2022-01-25 DIAGNOSIS — I110 Hypertensive heart disease with heart failure: Secondary | ICD-10-CM | POA: Insufficient documentation

## 2022-01-25 DIAGNOSIS — Z87891 Personal history of nicotine dependence: Secondary | ICD-10-CM | POA: Insufficient documentation

## 2022-01-25 HISTORY — PX: RIGHT/LEFT HEART CATH AND CORONARY ANGIOGRAPHY: CATH118266

## 2022-01-25 HISTORY — PX: CORONARY STENT INTERVENTION: CATH118234

## 2022-01-25 LAB — POCT I-STAT EG7
Acid-Base Excess: 3 mmol/L — ABNORMAL HIGH (ref 0.0–2.0)
Bicarbonate: 30.2 mmol/L — ABNORMAL HIGH (ref 20.0–28.0)
Calcium, Ion: 1.26 mmol/L (ref 1.15–1.40)
HCT: 45 % (ref 39.0–52.0)
Hemoglobin: 15.3 g/dL (ref 13.0–17.0)
O2 Saturation: 70 %
Potassium: 4.2 mmol/L (ref 3.5–5.1)
Sodium: 141 mmol/L (ref 135–145)
TCO2: 32 mmol/L (ref 22–32)
pCO2, Ven: 56.9 mmHg (ref 44–60)
pH, Ven: 7.334 (ref 7.25–7.43)
pO2, Ven: 40 mmHg (ref 32–45)

## 2022-01-25 LAB — ECHOCARDIOGRAM COMPLETE
AR max vel: 1.7 cm2
AV Area VTI: 1.83 cm2
AV Area mean vel: 1.57 cm2
AV Mean grad: 4 mmHg
AV Peak grad: 7.1 mmHg
Ao pk vel: 1.33 m/s
Area-P 1/2: 1.96 cm2
Height: 69 in
S' Lateral: 2.8 cm
Weight: 3376 oz

## 2022-01-25 LAB — POCT I-STAT 7, (LYTES, BLD GAS, ICA,H+H)
Acid-Base Excess: 1 mmol/L (ref 0.0–2.0)
Bicarbonate: 27.1 mmol/L (ref 20.0–28.0)
Calcium, Ion: 1.23 mmol/L (ref 1.15–1.40)
HCT: 44 % (ref 39.0–52.0)
Hemoglobin: 15 g/dL (ref 13.0–17.0)
O2 Saturation: 95 %
Potassium: 4.1 mmol/L (ref 3.5–5.1)
Sodium: 141 mmol/L (ref 135–145)
TCO2: 29 mmol/L (ref 22–32)
pCO2 arterial: 46.9 mmHg (ref 32–48)
pH, Arterial: 7.37 (ref 7.35–7.45)
pO2, Arterial: 78 mmHg — ABNORMAL LOW (ref 83–108)

## 2022-01-25 SURGERY — RIGHT/LEFT HEART CATH AND CORONARY ANGIOGRAPHY
Anesthesia: LOCAL

## 2022-01-25 MED ORDER — HYDRALAZINE HCL 20 MG/ML IJ SOLN: 10.0000 mg | INTRAMUSCULAR | Status: AC | PRN

## 2022-01-25 MED ORDER — NITROGLYCERIN 1 MG/10 ML FOR IR/CATH LAB
INTRA_ARTERIAL | Status: AC
  Filled 2022-01-25: qty 10

## 2022-01-25 MED ORDER — ASPIRIN 81 MG PO CHEW: 81.0000 mg | CHEWABLE_TABLET | ORAL | Status: DC

## 2022-01-25 MED ORDER — HEPARIN SODIUM (PORCINE) 1000 UNIT/ML IJ SOLN
INTRAMUSCULAR | Status: AC
  Filled 2022-01-25: qty 10

## 2022-01-25 MED ORDER — ROPINIROLE HCL 0.5 MG PO TABS
5.0000 mg | ORAL_TABLET | ORAL | Status: DC | PRN
Start: 2022-01-25 — End: 2022-01-26
  Administered 2022-01-25: 5 mg via ORAL
  Filled 2022-01-25: qty 5
  Filled 2022-01-25: qty 10

## 2022-01-25 MED ORDER — ROPINIROLE HCL 0.5 MG PO TABS
5.0000 mg | ORAL_TABLET | Freq: Every day | ORAL | Status: DC
  Administered 2022-01-25 – 2022-01-26 (×2): 5 mg via ORAL
  Filled 2022-01-25: qty 5

## 2022-01-25 MED ORDER — VERAPAMIL HCL 2.5 MG/ML IV SOLN
INTRAVENOUS | Status: DC | PRN
  Administered 2022-01-25: 10 mL via INTRA_ARTERIAL

## 2022-01-25 MED ORDER — SODIUM CHLORIDE 0.9 % IV SOLN: 250.0000 mL | INTRAVENOUS | Status: DC | PRN

## 2022-01-25 MED ORDER — MIDAZOLAM HCL 2 MG/2ML IJ SOLN
INTRAMUSCULAR | Status: DC | PRN
  Administered 2022-01-25: 1 mg via INTRAVENOUS

## 2022-01-25 MED ORDER — ONDANSETRON HCL 4 MG/2ML IJ SOLN: 4.0000 mg | Freq: Four times a day (QID) | INTRAMUSCULAR | Status: DC | PRN

## 2022-01-25 MED ORDER — SODIUM CHLORIDE 0.9 % WEIGHT BASED INFUSION
3.0000 mL/kg/h | INTRAVENOUS | Status: DC
  Administered 2022-01-25: 3 mL/kg/h via INTRAVENOUS

## 2022-01-25 MED ORDER — ACETAMINOPHEN 325 MG PO TABS
650.0000 mg | ORAL_TABLET | ORAL | Status: DC | PRN
  Administered 2022-01-25: 650 mg via ORAL
  Filled 2022-01-25: qty 2

## 2022-01-25 MED ORDER — ASPIRIN 81 MG PO CHEW
81.0000 mg | CHEWABLE_TABLET | Freq: Every day | ORAL | Status: DC
  Administered 2022-01-26: 81 mg via ORAL
  Filled 2022-01-25: qty 1

## 2022-01-25 MED ORDER — COLCHICINE 0.6 MG PO TABS
0.6000 mg | ORAL_TABLET | Freq: Once | ORAL | Status: AC
Start: 2022-01-25 — End: 2022-01-25
  Administered 2022-01-25: 0.6 mg via ORAL
  Filled 2022-01-25: qty 1

## 2022-01-25 MED ORDER — SODIUM CHLORIDE 0.9 % WEIGHT BASED INFUSION
1.0000 mL/kg/h | INTRAVENOUS | Status: DC
  Administered 2022-01-25: 1 mL/kg/h via INTRAVENOUS

## 2022-01-25 MED ORDER — IOHEXOL 350 MG/ML SOLN
INTRAVENOUS | Status: DC | PRN
  Administered 2022-01-25: 80 mL

## 2022-01-25 MED ORDER — ISOSORBIDE MONONITRATE ER 60 MG PO TB24
60.0000 mg | ORAL_TABLET | Freq: Every day | ORAL | Status: DC
  Administered 2022-01-26: 60 mg via ORAL
  Filled 2022-01-25: qty 1

## 2022-01-25 MED ORDER — PERFLUTREN LIPID MICROSPHERE
1.0000 mL | INTRAVENOUS | Status: AC | PRN
  Administered 2022-01-25: 2 mL via INTRAVENOUS

## 2022-01-25 MED ORDER — OXYCODONE HCL 5 MG PO TABS
20.0000 mg | ORAL_TABLET | Freq: Once | ORAL | Status: AC
  Administered 2022-01-25: 20 mg via ORAL
  Filled 2022-01-25: qty 4

## 2022-01-25 MED ORDER — GABAPENTIN 100 MG PO CAPS
100.0000 mg | ORAL_CAPSULE | Freq: Three times a day (TID) | ORAL | Status: DC
  Administered 2022-01-25 – 2022-01-26 (×4): 100 mg via ORAL
  Filled 2022-01-25 (×4): qty 1

## 2022-01-25 MED ORDER — VERAPAMIL HCL 2.5 MG/ML IV SOLN
INTRAVENOUS | Status: AC
Start: 2022-01-25 — End: ?
  Filled 2022-01-25: qty 2

## 2022-01-25 MED ORDER — RANOLAZINE ER 500 MG PO TB12
500.0000 mg | ORAL_TABLET | Freq: Two times a day (BID) | ORAL | Status: DC
  Administered 2022-01-25 – 2022-01-26 (×2): 500 mg via ORAL
  Filled 2022-01-25 (×2): qty 1

## 2022-01-25 MED ORDER — MIDAZOLAM HCL 2 MG/2ML IJ SOLN
INTRAMUSCULAR | Status: AC
  Filled 2022-01-25: qty 2

## 2022-01-25 MED ORDER — FENTANYL CITRATE (PF) 100 MCG/2ML IJ SOLN
INTRAMUSCULAR | Status: DC | PRN
  Administered 2022-01-25: 25 ug via INTRAVENOUS

## 2022-01-25 MED ORDER — ROPINIROLE HCL 1 MG PO TABS
10.0000 mg | ORAL_TABLET | Freq: Every day | ORAL | Status: DC
Start: 2022-01-25 — End: 2022-01-26
  Filled 2022-01-25 (×3): qty 10

## 2022-01-25 MED ORDER — NITROGLYCERIN 0.4 MG SL SUBL
0.4000 mg | SUBLINGUAL_TABLET | SUBLINGUAL | Status: DC | PRN
Start: 2022-01-25 — End: 2022-01-26

## 2022-01-25 MED ORDER — HEPARIN SODIUM (PORCINE) 1000 UNIT/ML IJ SOLN
INTRAMUSCULAR | Status: DC | PRN
  Administered 2022-01-25: 5000 [IU] via INTRAVENOUS
  Administered 2022-01-25: 6000 [IU] via INTRAVENOUS

## 2022-01-25 MED ORDER — SODIUM CHLORIDE 0.9% FLUSH
3.0000 mL | Freq: Two times a day (BID) | INTRAVENOUS | Status: DC
  Administered 2022-01-25: 3 mL via INTRAVENOUS

## 2022-01-25 MED ORDER — HEPARIN (PORCINE) IN NACL 1000-0.9 UT/500ML-% IV SOLN
INTRAVENOUS | Status: AC
  Filled 2022-01-25: qty 1000

## 2022-01-25 MED ORDER — SODIUM CHLORIDE 0.9 % IV SOLN: INTRAVENOUS | Status: AC

## 2022-01-25 MED ORDER — DILTIAZEM HCL ER COATED BEADS 120 MG PO CP24
120.0000 mg | ORAL_CAPSULE | Freq: Every day | ORAL | Status: DC
Start: 2022-01-26 — End: 2022-01-26
  Administered 2022-01-26: 120 mg via ORAL
  Filled 2022-01-25: qty 1

## 2022-01-25 MED ORDER — HEPARIN (PORCINE) IN NACL 1000-0.9 UT/500ML-% IV SOLN
INTRAVENOUS | Status: DC | PRN
  Administered 2022-01-25 (×2): 500 mL

## 2022-01-25 MED ORDER — TORSEMIDE 20 MG PO TABS
10.0000 mg | ORAL_TABLET | Freq: Every day | ORAL | Status: DC
Start: 2022-01-25 — End: 2022-01-26
  Filled 2022-01-25: qty 1

## 2022-01-25 MED ORDER — POTASSIUM CHLORIDE CRYS ER 20 MEQ PO TBCR
20.0000 meq | EXTENDED_RELEASE_TABLET | Freq: Every day | ORAL | Status: DC
  Administered 2022-01-26: 20 meq via ORAL
  Filled 2022-01-25: qty 1

## 2022-01-25 MED ORDER — FENTANYL CITRATE (PF) 100 MCG/2ML IJ SOLN
INTRAMUSCULAR | Status: AC
  Filled 2022-01-25: qty 2

## 2022-01-25 MED ORDER — ASPIRIN 81 MG PO CHEW
81.0000 mg | CHEWABLE_TABLET | ORAL | Status: AC
  Administered 2022-01-25: 81 mg via ORAL
  Filled 2022-01-25: qty 1

## 2022-01-25 MED ORDER — LIDOCAINE HCL (PF) 1 % IJ SOLN
INTRAMUSCULAR | Status: DC | PRN
  Administered 2022-01-25 (×2): 2 mL

## 2022-01-25 MED ORDER — LEVOTHYROXINE SODIUM 100 MCG PO TABS
200.0000 ug | ORAL_TABLET | Freq: Every day | ORAL | Status: DC
  Administered 2022-01-26: 200 ug via ORAL
  Filled 2022-01-25: qty 2

## 2022-01-25 MED ORDER — PANTOPRAZOLE SODIUM 40 MG PO TBEC
40.0000 mg | DELAYED_RELEASE_TABLET | Freq: Every day | ORAL | Status: DC
  Administered 2022-01-26: 40 mg via ORAL
  Filled 2022-01-25: qty 1

## 2022-01-25 MED ORDER — SODIUM CHLORIDE 0.9% FLUSH: 3.0000 mL | INTRAVENOUS | Status: DC | PRN

## 2022-01-25 MED ORDER — ROPINIROLE HCL 1 MG PO TABS
10.0000 mg | ORAL_TABLET | Freq: Three times a day (TID) | ORAL | Status: DC
Start: 2022-01-25 — End: 2022-01-25

## 2022-01-25 SURGICAL SUPPLY — 21 items
BALLN MINITREK OTW 2.0X15 (BALLOONS) ×2
BALLOON MINITREK OTW 2.0X15 (BALLOONS) IMPLANT
BAND CMPR LRG ZPHR (HEMOSTASIS) ×1
BAND ZEPHYR COMPRESS 30 LONG (HEMOSTASIS) ×1 IMPLANT
CATH 5FR JL3.5 JR4 ANG PIG MP (CATHETERS) ×1 IMPLANT
CATH BALLN WEDGE 5F 110CM (CATHETERS) ×1 IMPLANT
CATH LAUNCHER 6FR AL.75 (CATHETERS) ×1 IMPLANT
CATH VISTA GUIDE 6FR XBLAD3.5 (CATHETERS) ×1 IMPLANT
GLIDESHEATH SLEND SS 6F .021 (SHEATH) ×1 IMPLANT
GUIDEWIRE .025 260CM (WIRE) ×1 IMPLANT
GUIDEWIRE INQWIRE 1.5J.035X260 (WIRE) IMPLANT
INQWIRE 1.5J .035X260CM (WIRE) ×2
KIT ENCORE 26 ADVANTAGE (KITS) ×1 IMPLANT
KIT ESSENTIALS PG (KITS) ×1 IMPLANT
KIT HEART LEFT (KITS) ×3 IMPLANT
PACK CARDIAC CATHETERIZATION (CUSTOM PROCEDURE TRAY) ×3 IMPLANT
SHEATH GLIDE SLENDER 4/5FR (SHEATH) ×1 IMPLANT
TRANSDUCER W/STOPCOCK (MISCELLANEOUS) ×3 IMPLANT
TUBING CIL FLEX 10 FLL-RA (TUBING) ×3 IMPLANT
WIRE ASAHI FIELDER XT 300CM (WIRE) ×1 IMPLANT
WIRE COUGAR XT STRL 190CM (WIRE) ×1 IMPLANT

## 2022-01-25 NOTE — Interval H&P Note (Signed)
History and Physical Interval Note:  01/25/2022 6:54 AM  [name]  has presented today for surgery, with the diagnosis of unstable angina, shortness of breath.  The various methods of treatment have been discussed with the patient and family. After consideration of risks, benefits and other options for treatment, the patient has consented to  Procedure(s): RIGHT/LEFT HEART CATH AND CORONARY ANGIOGRAPHY (N/A) as a surgical intervention.  The patient's history has been reviewed, patient examined, no change in status, stable for surgery.  I have reviewed the patient's chart and labs.  Questions were answered to the patient's satisfaction.    Cath Lab Visit (complete for each Cath Lab visit)  Clinical Evaluation Leading to the Procedure:   ACS: No.  Non-ACS:    Anginal Classification: CCS III  Anti-ischemic medical therapy: Minimal Therapy (1 class of medications)  Non-Invasive Test Results: No non-invasive testing performed  Prior CABG: No previous CABG        [name]
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
## 2022-01-26 ENCOUNTER — Encounter (HOSPITAL_COMMUNITY): Payer: Self-pay | Admitting: Cardiovascular Disease

## 2022-01-26 ENCOUNTER — Observation Stay (HOSPITAL_COMMUNITY): Payer: Medicare HMO

## 2022-01-26 ENCOUNTER — Encounter: Payer: Self-pay | Admitting: Family

## 2022-01-26 ENCOUNTER — Other Ambulatory Visit (HOSPITAL_COMMUNITY): Payer: Self-pay

## 2022-01-26 DIAGNOSIS — Z7901 Long term (current) use of anticoagulants: Secondary | ICD-10-CM | POA: Diagnosis not present

## 2022-01-26 DIAGNOSIS — I110 Hypertensive heart disease with heart failure: Secondary | ICD-10-CM | POA: Diagnosis not present

## 2022-01-26 DIAGNOSIS — Z950 Presence of cardiac pacemaker: Secondary | ICD-10-CM | POA: Diagnosis not present

## 2022-01-26 DIAGNOSIS — I480 Paroxysmal atrial fibrillation: Secondary | ICD-10-CM | POA: Diagnosis not present

## 2022-01-26 DIAGNOSIS — I5032 Chronic diastolic (congestive) heart failure: Secondary | ICD-10-CM | POA: Diagnosis not present

## 2022-01-26 DIAGNOSIS — M25562 Pain in left knee: Secondary | ICD-10-CM | POA: Diagnosis not present

## 2022-01-26 DIAGNOSIS — I25110 Atherosclerotic heart disease of native coronary artery with unstable angina pectoris: Secondary | ICD-10-CM | POA: Diagnosis not present

## 2022-01-26 DIAGNOSIS — S8992XA Unspecified injury of left lower leg, initial encounter: Secondary | ICD-10-CM | POA: Diagnosis not present

## 2022-01-26 DIAGNOSIS — E039 Hypothyroidism, unspecified: Secondary | ICD-10-CM | POA: Diagnosis not present

## 2022-01-26 DIAGNOSIS — I252 Old myocardial infarction: Secondary | ICD-10-CM | POA: Diagnosis not present

## 2022-01-26 DIAGNOSIS — Z79899 Other long term (current) drug therapy: Secondary | ICD-10-CM | POA: Diagnosis not present

## 2022-01-26 LAB — CBC
HCT: 45.4 % (ref 39.0–52.0)
Hemoglobin: 14.7 g/dL (ref 13.0–17.0)
MCH: 29.3 pg (ref 26.0–34.0)
MCHC: 32.4 g/dL (ref 30.0–36.0)
MCV: 90.6 fL (ref 80.0–100.0)
Platelets: 258 10*3/uL (ref 150–400)
RBC: 5.01 MIL/uL (ref 4.22–5.81)
RDW: 14.6 % (ref 11.5–15.5)
WBC: 10 10*3/uL (ref 4.0–10.5)
nRBC: 0 % (ref 0.0–0.2)

## 2022-01-26 LAB — URIC ACID: Uric Acid, Serum: 7.8 mg/dL (ref 3.7–8.6)

## 2022-01-26 LAB — BASIC METABOLIC PANEL
Anion gap: 11 (ref 5–15)
BUN: 11 mg/dL (ref 8–23)
CO2: 23 mmol/L (ref 22–32)
Calcium: 8.8 mg/dL — ABNORMAL LOW (ref 8.9–10.3)
Chloride: 105 mmol/L (ref 98–111)
Creatinine, Ser: 1.15 mg/dL (ref 0.61–1.24)
GFR, Estimated: >60 mL/min (ref >60)
Glucose, Bld: 97 mg/dL (ref 70–99)
Potassium: 4.6 mmol/L (ref 3.5–5.1)
Sodium: 139 mmol/L (ref 135–145)

## 2022-01-26 LAB — POCT ACTIVATED CLOTTING TIME: Activated Clotting Time: 293 seconds

## 2022-01-26 MED ORDER — ROPINIROLE HCL 5 MG PO TABS: 5.0000 mg | ORAL_TABLET | ORAL | Status: DC

## 2022-01-26 MED ORDER — APIXABAN 5 MG PO TABS
5.0000 mg | ORAL_TABLET | Freq: Two times a day (BID) | ORAL | Status: DC
  Administered 2022-01-26: 5 mg via ORAL
  Filled 2022-01-26: qty 1

## 2022-01-26 MED ORDER — TORSEMIDE 20 MG PO TABS: 10.0000 mg | ORAL_TABLET | ORAL | Status: AC

## 2022-01-26 MED ORDER — RANOLAZINE ER 500 MG PO TB12
500.0000 mg | ORAL_TABLET | Freq: Two times a day (BID) | ORAL | 2 refills | Status: DC
  Filled 2022-01-26: qty 60, 30d supply, fill #0

## 2022-01-26 MED FILL — Nitroglycerin IV Soln 100 MCG/ML in D5W: INTRA_ARTERIAL | Qty: 10 | Status: AC

## 2022-01-26 NOTE — Discharge Instructions (Addendum)
Medication Changes: - START Ranexa '500mg'$  twice daily.  Post Cardiac Catheterization: NO HEAVY LIFTING OR SEXUAL ACTIVITY X 7 DAYS. NO DRIVING X 2-3 DAYS. NO SOAKING BATHS, HOT TUBS, POOLS, ETC., X 7 DAYS.  Radial Site Care: Refer to this sheet in the next few weeks. These instructions provide you with information on caring for yourself after your procedure. Your caregiver may also give you more specific instructions. Your treatment has been planned according to current medical practices, but problems sometimes occur. Call your caregiver if you have any problems or questions after your procedure. HOME CARE INSTRUCTIONS You may shower the day after the procedure. Remove the bandage (dressing) and gently wash the site with plain soap and water. Gently pat the site dry.  Do not apply powder or lotion to the site.  Do not submerge the affected site in water for 3 to 5 days.  Inspect the site at least twice daily.  Do not flex or bend the affected arm for 24 hours.  No lifting over 5 pounds (2.3 kg) for 5 days after your procedure.  Do not drive home if you are discharged the same day of the procedure. Have someone else drive you.  What to expect: Any bruising will usually fade within 1 to 2 weeks.  Blood that collects in the tissue (hematoma) may be painful to the touch. It should usually decrease in size and tenderness within 1 to 2 weeks.  SEEK IMMEDIATE MEDICAL CARE IF: You have unusual pain at the radial site.  You have redness, warmth, swelling, or pain at the radial site.  You have drainage (other than a small amount of blood on the dressing).  You have chills.  You have a fever or persistent symptoms for more than 72 hours.  You have a fever and your symptoms suddenly get worse.  Your arm becomes pale, cool, tingly, or numb.  You have heavy bleeding from the site. Hold pressure on the site.  _______________  Information on my medicine - ELIQUIS (apixaban)  This medication  education was reviewed with me or my healthcare representative as part of my discharge preparation.   Why was Eliquis prescribed for you? Eliquis was prescribed for you to reduce the risk of forming blood clots that can cause a stroke if you have a medical condition called atrial fibrillation / flutter (a type of irregular heartbeat).  What do You need to know about Eliquis ? Take your Eliquis TWICE DAILY - one tablet in the morning and one tablet in the evening with or without food.  It would be best to take the doses about the same time each day.  If you have difficulty swallowing the tablet whole please discuss with your pharmacist how to take the medication safely.  Take Eliquis exactly as prescribed by your doctor and DO NOT stop taking Eliquis without talking to the doctor who prescribed the medication.  Stopping may increase your risk of developing a new clot or stroke.  Refill your prescription before you run out.  After discharge, you should have regular check-up appointments with your healthcare provider that is prescribing your Eliquis.  In the future your dose may need to be changed if your kidney function or weight changes by a significant amount or as you get older.  What do you do if you miss a dose? If you miss a dose, take it as soon as you remember on the same day and resume taking twice daily.  Do not take more  than one dose of ELIQUIS at the same time.  Important Safety Information A possible side effect of Eliquis is bleeding. You should call your healthcare provider right away if you experience any of the following: Bleeding from an injury or your nose that does not stop. Unusual colored urine (red or dark brown) or unusual colored stools (red or black). Unusual bruising for unknown reasons. A serious fall or if you hit your head (even if there is no bleeding).  Some medicines may interact with Eliquis and might increase your risk of bleeding or clotting while on  Eliquis. To help avoid this, consult your healthcare provider or pharmacist prior to using any new prescription or non-prescription medications, including herbals, vitamins, non-steroidal anti-inflammatory drugs (NSAIDs) and supplements.  This website has more information on Eliquis (apixaban): [email or web address].no.

## 2022-01-26 NOTE — Discharge Summary (Signed)
Discharge Summary    Patient ID: [name] MRN: [id number]; DOB: Jun 02, 1941  Admit date: 01/25/2022 Discharge date: 01/26/2022  PCP:  [name], MD   [facility] Providers Cardiologist:  [name], MD  Electrophysiologist:  Virl Axe, MD     Discharge Diagnoses    Principal Problem:   Unstable angina [facility]) Active Problems:   Coronary artery disease   Essential hypertension   Hyperlipidemia LDL goal <70   Sinus node dysfunction s/p PPM   Atrial fibrillation ([place])   (HFpEF) heart failure with preserved ejection fraction [facility])    Diagnostic Studies/Procedures    Cardiac Catheterization 01/25/2022:   Ost LAD to Prox LAD lesion is 50% stenosed.   Mid Cx lesion is 30% stenosed.   Prox RCA lesion is 20% stenosed.   Mid RCA lesion is 100% stenosed.   Mid LAD lesion is 20% stenosed.   Dist LAD-1 lesion is 70% stenosed.   Dist LAD-2 lesion is 90% stenosed.   Previously placed RPDA stent of unknown type is  widely patent.   Patent mid LAD stent. The distal LAD becomes small in caliber and has diffuse disease, not favorable for PCI given vessel size and diffuse nature of disease.  The Circumflex has mild plaque The dominant RCA has a 100% mid occlusion (CTO). The distal branches fill from left to right collaterals.  4.  Attempted PCI of the mid RCA CTO, unsuccessful 5.  Normal right and left heart pressures (RA 7, RV 30/9/17, PA 32/17 mean 24, PCWP 9, LV 132/11/10, AO 134/82)   Recommendations: [name] has many complaints including dyspnea and chest pressure. No favorable targets for PCI. The RCA is now completely occluded but fills from left to right collaterals. Attempted CTO PCI unsuccessful. I do not think PCI of the distal LAD is a good option. For now, will continue medical management of CAD. Will admit to telemetry overnight given many complaints. Will arrange an echo today. Will add Ranexa 500 mg po BID. I will have our CTO team review his films  but I question whether he would have symptomatic improvement with PCI of the RCA.   Diagnostic Dominance: Right  _____________   Echocardiogram 01/25/2022: Impressions:  1. Left ventricular ejection fraction, by estimation, is 55 to 60%. The  left ventricle has normal function. The left ventricle has no regional  wall motion abnormalities. Left ventricular diastolic parameters are  consistent with Grade I diastolic  dysfunction (impaired relaxation).   2. Right ventricular systolic function is normal. The right ventricular  size is mildly enlarged.   3. The mitral valve is normal in structure. Trivial mitral valve  regurgitation. No evidence of mitral stenosis.   4. The aortic valve is tricuspid. There is mild calcification of the  aortic valve. There is mild thickening of the aortic valve. Aortic valve  regurgitation is not visualized. Aortic valve sclerosis is present, with  no evidence of aortic valve stenosis.  Aortic valve mean gradient measures 4.0 mmHg. Aortic valve Vmax measures  1.33 m/s.   5. The inferior vena cava is normal in size with greater than 50%  respiratory variability, suggesting right atrial pressure of 3 mmHg.   Comparison(s): Prior images reviewed side by side.   History of Present Illness     [name] is a 81 y.o. male with a history of CAD with DES to LAD and RCA in 10/2014, HFpEF, sinus node dysfunction s/p PPM in 2018, paroxysmal atrial fibrillation on Eliquis, hypertension,  hyperlipidemia intolerant to statins, pre-diabetes, obstructive sleep apnea, prior GI bleed secondary to AVM who is followed by Dr. [name] and Dr. [name].  Patient was recently seen by Richardson Dopp, PA-C, on 01/22/2022 at which time he reported exertional chest pain and shortness of breath over the last several months. He reported symptoms with just minimal activity and noted chest pain at rest at times. Symptoms improve with Nitroglycerin. He described being awoken by shortness of  breath and chest pain at night at times and took Nitroglycerin for relief. He sleeps on an incline chronically. He reported some lower extremity edema which is fairly chronic but overall improved. He also described some near syncope but no overt syncope.  He was noted to have several different QRS morphologies on EKG with pacing during this office visit. Device was interrogated and showed normal sinus rhythm with PACs and intermittent AS VP and AV paced rhythms. His device was functioning appropriately.   Decision was made to proceed with outpatient right and left cardiac catheterization for further evaluation of symptoms.  Hospital Course     Consultants: None   Patient presented to [facility] on 01/25/2022 for planned outpatient R/LHC as noted above. Cath showed patent mid LAD stent with diffuse disease of distal LAD (not favorable for PCI given size of vessel and diffuse nature of disease), CTO of mid RCA with distal branches that fill from left to right collaterals, and mild LCX disease. PCI was attempted of the RCA CTO but was unsuccessful. He had normal right and left heart pressures. Continued medical therapy was recommended. Home Imdur was continued and he was started on Ranexa '500mg'$  twice daily. He was kept overnight for observation given multiple complaints during cath. He is felt to be stable for discharge today. Will be discharged on Ranexa as above. Otherwise, no changes to home medications. OK to restart Eliquis today. He is not on Aspirin due to the fact that he is on Eliquis. Of note, he is not on any medical therapy for his hyperlipidemia because he has been intolerant to statins in the past and has declined Zetia in the past as well as referral to lipid clinic for consideration of PCSK9 inhibitor.  Of note, patient biggest complaint on day of discharge is left knee pain. He states he hit is knee on his bad on 01/23/2022 and it has been hurting every since. It was noted to be  slightly erythematous and warm to the touch. X-ray was performed and showed degenerative changes but no acute findings. He has a history of gout but states is his feet but states this feels different. Uric acid was checked and was normal. Recommended conservative management with rest and ice or heat (he noted more improvement with warm compress) and OTC Tylenol. Offered referral to Ortho but patient would like to follow-up with PCP first.  Patient seen and examined by Dr. Harrington Challenger today and determined to be stable for discharge. He already has follow-up with Richardson Dopp, PA-C, scheduled for 02/09/2022 and our office will call him to arrange follow-up with Dr. Martinique to discuss treatment of CTO. Medications as below.  Did the patient have an acute coronary syndrome (MI, NSTEMI, STEMI, etc) this admission?:  No                               Did the patient have a percutaneous coronary intervention (stent / angioplasty)?:  No.    _____________  Discharge Vitals Blood pressure (!) 149/77, pulse (!) 48, temperature 97.8 F (36.6 C), temperature source Oral, resp. rate 18, height '5\' 9"'$  (1.753 m), weight 95.7 kg, SpO2 95 %.  Filed Weights   01/25/22 0529  Weight: 95.7 kg    Labs & Radiologic Studies    CBC Recent Labs    01/25/22 0800 01/26/22 0303  WBC  --  10.0  HGB 15.3 14.7  HCT 45.0 45.4  MCV  --  90.6  PLT  --  390   Basic Metabolic Panel Recent Labs    01/25/22 0800 01/26/22 0303  NA 141 139  K 4.2 4.6  CL  --  105  CO2  --  23  GLUCOSE  --  97  BUN  --  11  CREATININE  --  1.15  CALCIUM  --  8.8*   Liver Function Tests No results for input(s): "AST", "ALT", "ALKPHOS", "BILITOT", "PROT", "ALBUMIN" in the last 72 hours. No results for input(s): "LIPASE", "AMYLASE" in the last 72 hours. High Sensitivity Troponin:   No results for input(s): "TROPONINIHS" in the last 720 hours.  BNP Invalid input(s): "POCBNP" D-Dimer No results for input(s): "DDIMER" in the last 72  hours. Hemoglobin A1C No results for input(s): "HGBA1C" in the last 72 hours. Fasting Lipid Panel No results for input(s): "CHOL", "HDL", "LDLCALC", "TRIG", "CHOLHDL", "LDLDIRECT" in the last 72 hours. Thyroid Function Tests No results for input(s): "TSH", "T4TOTAL", "T3FREE", "THYROIDAB" in the last 72 hours.  Invalid input(s): "FREET3" _____________  DG Knee Complete 4 Views Left  Result Date: 01/26/2022 CLINICAL DATA:  Pt reports that he hit his left knee in the middle of the night last night. Pt reports pain when rotating and bending knee. EXAM: LEFT KNEE - COMPLETE 4+ VIEW COMPARISON:  06/10/2016 FINDINGS: There is joint space narrowing particularly of the MEDIAL and patellofemoral compartments. No acute fracture or subluxation. No joint effusion. There is atherosclerotic calcification of the popliteal artery. IMPRESSION: No evidence for acute  abnormality.  Degenerative changes. Electronically Signed   By: Nolon Nations M.D.   On: 01/26/2022 10:01   ECHOCARDIOGRAM COMPLETE  Result Date: 01/25/2022    ECHOCARDIOGRAM REPORT   Patient Name:   [name] Date of Exam: 01/25/2022 Medical Rec #:  [id number]        Height:       69.0 in Accession #:    [id number]       Weight:       211.0 lb Date of Birth:  12/26/1940        BSA:          2.114 m Patient Age:    29 years         BP:           137/94 mmHg Patient Gender: M                HR:           74 bpm. Exam Location:  Inpatient Procedure: 2D Echo, Cardiac Doppler, Color Doppler and Intracardiac            Opacification Agent Indications:    CAD  History:        Patient has prior history of Echocardiogram examinations, most                 recent 08/07/2021. CAD, Cardiac cath 01/26/22 and Pacemaker,  Arrythmias:Atrial Fibrillation; Risk Factors:Dyslipidemia.  Sonographer:    Merrie Roof RDCS Referring Phys: [place]  1. Left ventricular ejection fraction, by estimation, is 55 to 60%. The left  ventricle has normal function. The left ventricle has no regional wall motion abnormalities. Left ventricular diastolic parameters are consistent with Grade I diastolic dysfunction (impaired relaxation).  2. Right ventricular systolic function is normal. The right ventricular size is mildly enlarged.  3. The mitral valve is normal in structure. Trivial mitral valve regurgitation. No evidence of mitral stenosis.  4. The aortic valve is tricuspid. There is mild calcification of the aortic valve. There is mild thickening of the aortic valve. Aortic valve regurgitation is not visualized. Aortic valve sclerosis is present, with no evidence of aortic valve stenosis. Aortic valve mean gradient measures 4.0 mmHg. Aortic valve Vmax measures 1.33 m/s.  5. The inferior vena cava is normal in size with greater than 50% respiratory variability, suggesting right atrial pressure of 3 mmHg. Comparison(s): Prior images reviewed side by side. FINDINGS  Left Ventricle: Left ventricular ejection fraction, by estimation, is 55 to 60%. The left ventricle has normal function. The left ventricle has no regional wall motion abnormalities. The left ventricular internal cavity size was normal in size. There is  no left ventricular hypertrophy. Left ventricular diastolic parameters are consistent with Grade I diastolic dysfunction (impaired relaxation). Right Ventricle: The right ventricular size is mildly enlarged. No increase in right ventricular wall thickness. Right ventricular systolic function is normal. Left Atrium: Left atrial size was normal in size. Right Atrium: Right atrial size was normal in size. Pericardium: There is no evidence of pericardial effusion. Mitral Valve: The mitral valve is normal in structure. Trivial mitral valve regurgitation. No evidence of mitral valve stenosis. Tricuspid Valve: The tricuspid valve is normal in structure. Tricuspid valve regurgitation is trivial. No evidence of tricuspid stenosis. Aortic Valve:  The aortic valve is tricuspid. There is mild calcification of the aortic valve. There is mild thickening of the aortic valve. Aortic valve regurgitation is not visualized. Aortic valve sclerosis is present, with no evidence of aortic valve stenosis. Aortic valve mean gradient measures 4.0 mmHg. Aortic valve peak gradient measures 7.1 mmHg. Aortic valve area, by VTI measures 1.83 cm. Pulmonic Valve: The pulmonic valve was normal in structure. Pulmonic valve regurgitation is not visualized. No evidence of pulmonic stenosis. Aorta: The aortic root is normal in size and structure. Venous: The inferior vena cava is normal in size with greater than 50% respiratory variability, suggesting right atrial pressure of 3 mmHg. IAS/Shunts: No atrial level shunt detected by color flow Doppler. Additional Comments: A device lead is visualized in the right ventricle.  LEFT VENTRICLE PLAX 2D LVIDd:         3.90 cm   Diastology LVIDs:         2.80 cm   LV e' medial:    6.20 cm/s LV PW:         0.90 cm   LV E/e' medial:  9.7 LV IVS:        0.80 cm   LV e' lateral:   7.29 cm/s LVOT diam:     2.00 cm   LV E/e' lateral: 8.3 LV SV:         52 LV SV Index:   24 LVOT Area:     3.14 cm  RIGHT VENTRICLE RV Basal diam:  4.40 cm RV Mid diam:    3.50 cm RV S prime:  14.50 cm/s TAPSE (M-mode): 2.2 cm LEFT ATRIUM             Index        RIGHT ATRIUM           Index LA diam:        4.50 cm 2.13 cm/m   RA Area:     21.10 cm LA Vol (A2C):   47.5 ml 22.47 ml/m  RA Volume:   60.50 ml  28.62 ml/m LA Vol (A4C):   50.1 ml 23.70 ml/m LA Biplane Vol: 50.4 ml 23.85 ml/m  AORTIC VALVE AV Area (Vmax):    1.70 cm AV Area (Vmean):   1.57 cm AV Area (VTI):     1.83 cm AV Vmax:           133.00 cm/s AV Vmean:          98.300 cm/s AV VTI:            0.282 m AV Peak Grad:      7.1 mmHg AV Mean Grad:      4.0 mmHg LVOT Vmax:         71.80 cm/s LVOT Vmean:        49.100 cm/s LVOT VTI:          0.164 m LVOT/AV VTI ratio: 0.58  AORTA Ao Root diam: 3.70 cm  Ao Asc diam:  3.60 cm MITRAL VALVE MV Area (PHT): 1.96 cm    SHUNTS MV Decel Time: 387 msec    Systemic VTI:  0.16 m MV E velocity: 60.40 cm/s  Systemic Diam: 2.00 cm MV A velocity: 92.50 cm/s MV E/A ratio:  0.65 Candee Furbish MD Electronically signed by Candee Furbish MD Signature Date/Time: 01/25/2022/3:23:52 PM    Final    CARDIAC CATHETERIZATION  Result Date: 01/25/2022   Ost LAD to Prox LAD lesion is 50% stenosed.   Mid Cx lesion is 30% stenosed.   Prox RCA lesion is 20% stenosed.   Mid RCA lesion is 100% stenosed.   Mid LAD lesion is 20% stenosed.   Dist LAD-1 lesion is 70% stenosed.   Dist LAD-2 lesion is 90% stenosed.   Previously placed RPDA stent of unknown type is  widely patent. Patent mid LAD stent. The distal LAD becomes small in caliber and has diffuse disease, not favorable for PCI given vessel size and diffuse nature of disease. The Circumflex has mild plaque The dominant RCA has a 100% mid occlusion (CTO). The distal branches fill from left to right collaterals. 4.  Attempted PCI of the mid RCA CTO, unsuccessful 5.  Normal right and left heart pressures (RA 7, RV 30/9/17, PA 32/17 mean 24, PCWP 9, LV 132/11/10, AO 134/82) Recommendations: [name] has many complaints including dyspnea and chest pressure. No favorable targets for PCI. The RCA is now completely occluded but fills from left to right collaterals. Attempted CTO PCI unsuccessful. I do not think PCI of the distal LAD is a good option. For now, will continue medical management of CAD. Will admit to telemetry overnight given many complaints. Will arrange an echo today. Will add Ranexa 500 mg po BID. I will have our CTO team review his films but I question whether he would have symptomatic improvement with PCI of the RCA.    Disposition   Patient is being discharged home today in good condition.  Follow-up Plans & Appointments     Follow-up Information     Liliane Shi, PA-C Follow up.  Specialties: Cardiology, Physician  Assistant Why: Please keep follow-up visit with Richardson Dopp, PA-C, scheduled for 02/09/2022 at 8:25am. Please arrive 15 minutes early for check-in. Contact information: [address] [place] [address] [phone]         Martinique, Peter M, MD Follow up.   Specialty: Cardiology Why: Our office will call you to schedule appointment with Dr. Martinique to discuss intervention of your chronic total occlusion of the RCA. Contact information: [address] [place] [address] [phone]                Discharge Instructions     Diet - low sodium heart healthy   Complete by: As directed    Increase activity slowly   Complete by: As directed        Discharge Medications   Allergies as of 01/26/2022       Reactions   Brilinta [ticagrelor] Shortness Of Breath   Colchicine    Syncope-causes patient to pass out   Metoprolol Tartrate Other (See Comments)   Severe chest pains " flat lined patient", chest pain, Tachyarrhythmia   Mirapex [pramipexole Dihydrochloride]    Severe leg pain   Shellfish Allergy Anaphylaxis, Hives   Statins Other (See Comments)   All statins cause myalgias   Zolpidem Other (See Comments)   Chest pain   Coconut (cocos Nucifera) Hives   Lasix [furosemide] Hives, Itching   Levaquin [levofloxacin] Hives   Trazodone And Nefazodone Other (See Comments)   Unsteady on feet        Medication List     STOP taking these medications    neomycin-polymyxin-hydrocortisone 3.5-10000-1 OTIC suspension Commonly known as: CORTISPORIN   potassium chloride 20 MEQ/15ML (10%) Soln       TAKE these medications    acetaminophen 500 MG tablet Commonly known as: TYLENOL Take 1,000 mg by mouth every 6 (six) hours as needed for moderate pain.   apixaban 5 MG Tabs tablet Commonly known as: ELIQUIS Take 1 tablet (5 mg total) by mouth 2 (two) times daily.   diclofenac sodium 1 % Gel Commonly known as: Voltaren Apply 1  application topically daily as needed (ankle pain).   diltiazem 120 MG 24 hr capsule Commonly known as: CARDIZEM CD TAKE 1 CAPSULE BY MOUTH EVERY DAY   fluticasone 50 MCG/ACT nasal spray Commonly known as: FLONASE Place 1 spray into both nostrils daily as needed for allergies or rhinitis.   gabapentin 100 MG capsule Commonly known as: NEURONTIN TAKE 1 CAPSULE BY MOUTH THREE TIMES A DAY   isosorbide mononitrate 60 MG 24 hr tablet Commonly known as: IMDUR Take 1 tablet (60 mg total) by mouth daily.   ketoconazole 2 % cream Commonly known as: NIZORAL Apply 1 application topically 2 (two) times daily as needed for irritation.   levothyroxine 200 MCG tablet Commonly known as: SYNTHROID Take 1 tablet (200 mcg total) by mouth daily.   Lubricant Eye Drops PF 0.5 % Soln Generic drug: Carboxymethylcellulose Sod PF Place 1 drop into both eyes 4 (four) times daily as needed (dry/irritated eyes.).   magnesium oxide 400 MG tablet Commonly known as: MAG-OX Take 400 mg by mouth daily as needed (restless leg).   moxifloxacin 0.5 % ophthalmic solution Commonly known as: VIGAMOX Place 1 drop into the right eye in the morning and at bedtime.   nitroGLYCERIN 0.4 MG SL tablet Commonly known as: Nitrostat Place 1 tablet (0.4 mg total) under the tongue every 5 (five) minutes  as needed for chest pain.   omeprazole 40 MG capsule Commonly known as: PRILOSEC Take 1 capsule (40 mg total) by mouth daily.   oxycodone 30 MG immediate release tablet Commonly known as: ROXICODONE Take 1 tablet (30 mg total) by mouth every 6 (six) hours as needed for pain. Start taking on: January 31, 2022   potassium chloride SA 20 MEQ tablet Commonly known as: KLOR-CON M Take 20 mEq by mouth daily.   ranolazine 500 MG 12 hr tablet Commonly known as: RANEXA Take 1 tablet (500 mg total) by mouth 2 (two) times daily.   ropinirole 5 MG tablet Commonly known as: REQUIP Take 1-2 tablets (5-10 mg total) by mouth  See admin instructions. Take 5 mg in the morning and 10 mg at night, may take 5 mg up to 6 times a day as needed for restless legs   torsemide 20 MG tablet Commonly known as: DEMADEX Take 0.5 tablets (10 mg total) by mouth See admin instructions. Take 10 mg daily, may take a second 10 mg dose an hour later if he doesn't use the bathroom   vitamin B-12 100 MCG tablet Commonly known as: CYANOCOBALAMIN Take 100 mcg by mouth daily.   VITAMIN C PO Take 1-4 tablets by mouth See admin instructions. Take 1 tablet daily, may increase to 3-4 tabs daily as needed for immune support           Outstanding Labs/Studies   N/A  Duration of Discharge Encounter   Greater than 30 minutes including physician time.  Signed, Darreld Mclean, PA-C 01/26/2022, 2:35 PM  Patient seen and examined   I agree with findings as noted by L Mancel Bale above  Pt comfortable today     On exam,   Lungs are CTA Cardiac RRR  No S3   No murmurs   Ext are without edema  Plan for outpatient follow up with P Martinique to review findings of cath, consider if patient candidate for treatment of CTO  Plan to d/c today   Dorris Carnes, MD

## 2022-01-26 NOTE — Progress Notes (Signed)
Pt having 10/10 pain in his left knee from a previous fall at home, site is red & warm to the touch. Kalman Shan, MD paged & made aware; see new orders. Will continue to monitor.   Elaina Hoops, RN

## 2022-01-27 DIAGNOSIS — M25562 Pain in left knee: Secondary | ICD-10-CM | POA: Diagnosis not present

## 2022-01-29 ENCOUNTER — Telehealth: Payer: Self-pay

## 2022-01-29 ENCOUNTER — Ambulatory Visit
Admission: RE | Admit: 2022-01-29 | Discharge: 2022-01-29 | Disposition: A | Discharge status: Home / Self Care | Payer: Medicare HMO | Source: Ambulatory Visit | Attending: Family Medicine | Admitting: Family Medicine

## 2022-01-29 ENCOUNTER — Telehealth: Payer: Self-pay | Admitting: Cardiovascular Disease

## 2022-01-29 DIAGNOSIS — M40204 Unspecified kyphosis, thoracic region: Secondary | ICD-10-CM | POA: Diagnosis not present

## 2022-01-29 DIAGNOSIS — M47814 Spondylosis without myelopathy or radiculopathy, thoracic region: Secondary | ICD-10-CM | POA: Diagnosis not present

## 2022-01-29 DIAGNOSIS — R590 Localized enlarged lymph nodes: Secondary | ICD-10-CM | POA: Diagnosis not present

## 2022-01-29 DIAGNOSIS — I2510 Atherosclerotic heart disease of native coronary artery without angina pectoris: Secondary | ICD-10-CM | POA: Diagnosis not present

## 2022-01-29 MED ORDER — IOPAMIDOL (ISOVUE-300) INJECTION 61%
75.0000 mL | Freq: Once | INTRAVENOUS | Status: AC | PRN
  Administered 2022-01-29: 75 mL via INTRAVENOUS

## 2022-01-29 NOTE — Telephone Encounter (Signed)
Spoke with pt and advised of lab results per Richardson Dopp, PA-C.  Pt verbalizes understanding and agrees with current plan.    Your Creatinine (kidney function), potassium, hemoglobin (blood count) and NT-Pro BNP (congestive heart failure test) are all normal.  Continue current medications/treatment plan and follow up as scheduled.  [name]..

## 2022-01-29 NOTE — Telephone Encounter (Signed)
Patient returned call regarding test results.

## 2022-01-29 NOTE — Telephone Encounter (Signed)
Spoke to patient appointment scheduled with Dr.Jordan 02/23/22 at 1:00 pm to discuss CTO.

## 2022-01-30 ENCOUNTER — Other Ambulatory Visit (HOSPITAL_COMMUNITY): Payer: Self-pay

## 2022-01-30 ENCOUNTER — Encounter: Payer: Self-pay | Admitting: Cardiovascular Disease

## 2022-01-30 NOTE — Telephone Encounter (Signed)
Error

## 2022-01-30 NOTE — Progress Notes (Signed)
Pt has been made aware of normal result and verbalized understanding.  jw

## 2022-01-31 ENCOUNTER — Other Ambulatory Visit (HOSPITAL_COMMUNITY): Payer: Self-pay

## 2022-01-31 ENCOUNTER — Telehealth: Payer: Self-pay | Admitting: Pharmacist

## 2022-01-31 NOTE — Chronic Care Management (AMB) (Signed)
Chronic Care Management Pharmacy Assistant   Name: [name]  MRN: [id number] DOB: 24-May-1941  Reason for Encounter: Medication Review / Medication Coordination Call  Recent office visits:  01/10/2022 [name] MD - Patient was seen for Sciatica of right side associated with disorder of lumbosacral spine and additional issues. Increased Ropinirole to 10 mg 3 times daily. Discontinued Wilder Glade. No follow up noted.   Recent consult visits:  01/22/2022 Richardson Dopp PA-C (cardiology) - Patient was seen for Coronary artery disease involving native coronary artery of native heart with unstable angina pectoris and additional issues. No medication changes. Follow up post procedure.  Hospital visits:  Admitted to [facility] on 01/25/2022 due to Unstable angina, patient had a right/left heart cath and coronary angiography. Discharge date was 01/26/2022.  New?Medications Started at [facility] Discharge:?? ranolazine (RANEXA) Medication Changes at Hospital Discharge: No medication changes Medications Discontinued at Hospital Discharge: neomycin-polymyxin-hydrocortisone 3.5-10000-1 OTIC suspension (CORTISPORIN) potassium chloride 20 MEQ/15ML (10%) Soln Medications that remain the same after Hospital Discharge:??  -All other medications will remain the same.    Medications: Outpatient Encounter Medications as of 01/31/2022  Medication Sig   acetaminophen (TYLENOL) 500 MG tablet Take 1,000 mg by mouth every 6 (six) hours as needed for moderate pain.   apixaban (ELIQUIS) 5 MG TABS tablet Take 1 tablet (5 mg total) by mouth 2 (two) times daily.   Ascorbic Acid (VITAMIN C PO) Take 1-4 tablets by mouth See admin instructions. Take 1 tablet daily, may increase to 3-4 tabs daily as needed for immune support   Carboxymethylcellulose Sod PF (LUBRICANT EYE DROPS PF) 0.5 % SOLN Place 1 drop into both eyes 4 (four) times daily as needed (dry/irritated eyes.).   diclofenac sodium  (VOLTAREN) 1 % GEL Apply 1 application topically daily as needed (ankle pain).   diltiazem (CARDIZEM CD) 120 MG 24 hr capsule TAKE 1 CAPSULE BY MOUTH EVERY DAY   fluticasone (FLONASE) 50 MCG/ACT nasal spray Place 1 spray into both nostrils daily as needed for allergies or rhinitis.   gabapentin (NEURONTIN) 100 MG capsule TAKE 1 CAPSULE BY MOUTH THREE TIMES A DAY   isosorbide mononitrate (IMDUR) 60 MG 24 hr tablet Take 1 tablet (60 mg total) by mouth daily.   ketoconazole (NIZORAL) 2 % cream Apply 1 application topically 2 (two) times daily as needed for irritation.   levothyroxine (SYNTHROID) 200 MCG tablet Take 1 tablet (200 mcg total) by mouth daily.   magnesium oxide (MAG-OX) 400 MG tablet Take 400 mg by mouth daily as needed (restless leg).   moxifloxacin (VIGAMOX) 0.5 % ophthalmic solution Place 1 drop into the right eye in the morning and at bedtime.   nitroGLYCERIN (NITROSTAT) 0.4 MG SL tablet Place 1 tablet (0.4 mg total) under the tongue every 5 (five) minutes as needed for chest pain.   omeprazole (PRILOSEC) 40 MG capsule Take 1 capsule (40 mg total) by mouth daily.   oxycodone (ROXICODONE) 30 MG immediate release tablet Take 1 tablet (30 mg total) by mouth every 6 (six) hours as needed for pain.   potassium chloride SA (KLOR-CON M) 20 MEQ tablet Take 20 mEq by mouth daily.   ranolazine (RANEXA) 500 MG 12 hr tablet Take 1 tablet (500 mg total) by mouth 2 (two) times daily.   ropinirole (REQUIP) 5 MG tablet Take 1-2 tablets (5-10 mg total) by mouth See admin instructions. Take 5 mg in the morning and 10 mg at night, may take 5 mg up to  6 times a day as needed for restless legs   torsemide (DEMADEX) 20 MG tablet Take 0.5 tablets (10 mg total) by mouth See admin instructions. Take 10 mg daily, may take a second 10 mg dose an hour later if he doesn't use the bathroom   vitamin B-12 (CYANOCOBALAMIN) 100 MCG tablet Take 100 mcg by mouth daily.   No facility-administered encounter medications on  file as of 01/31/2022.  Reviewed chart for medication changes ahead of medication coordination call.  No OVs, Consults, or hospital visits since last care coordination call/Pharmacist visit. (If appropriate, list visit date, provider name)  No medication changes indicated OR if recent visit, treatment plan here.  BP Readings from Last 3 Encounters:  01/26/22 (!) 149/77  01/22/22 120/70  01/10/22 118/72    Lab Results  Component Value Date   HGBA1C 6.3 11/21/2021     Patient obtains medications through Adherence Packaging  30 Days    Last adherence delivery included: This delivery to include: Diltiazem 120 mg - 1 tablet at breakfast Gabapentin 100 mg - 1 tablet at breakfast and 2 at bedtime  Vitamin B12 500 mcg - 1 tablet at breakfast and bedtime Torsemide 20 mg - 1 tablet at breakfast on Mon, Wed and Fri Levothyroxine 200 mcg - 1 tablet before breakfast Isosorbide 60 mg - 1 tablet at breakfast Nitroglycerin 0.4 mg - 1 tablet under tongue every 5 min as needed for chest pain Apixaban 5 mg - take 1 tablet at breakfast and bedtime  Notes: D/C Glipizide, start Wilder Glade will need to pick up samples at office. Change Potassium to 2 of the '10mg'$  tablets as the liquid will cost >150. Choose appt time with Sarajane Jews to discuss RLS options.   Patient declined last month:       Patient is due for next adherence delivery on: 02/13/2022   Called patient and reviewed medications and coordinated delivery.   This delivery to include: Gabapentin 100 mg - 1 tablet at breakfast and 2 at bedtime Vitamin B12 500 mcg - 1 tablet at breakfast and bedtime Torsemide 20 mg - 1 tablet at breakfast on Mon, Wed and Fri Diltiazem 120 mg - 1 tablet at breakfast Apixaban 5 mg - take 1 tablet at breakfast and bedtime Isosorbide 60 mg - 1 tablet at breakfast Levothyroxine 200 mcg - 1 tablet before breakfast Klor-Con 10 meq - 2 tablets at breakfast Ropinirole 5 mg - 2 tablets at breakfast, lunch and at  bedtime Nitroglycerin 0.4 mg - 1 tablet under tongue every 5 min as needed for chest pain   Does patient need filled: Omeprazole 40 mg - 1 tablet at breakfast    Notes: D/C Farxiga see 01/10/2022 ov notes.   Follow up hospital visit: Patient states he is feeling about the same as he was when he was discharged from the hospital.  He is fatigued, weak and a little short of breath, he states his symptom have not gotten worse, he feels they are stable.  He has an upcoming appointment with his cardiologist Dr. Martinique on 02/23/2022 to review the procedure that needs to be done for his blockage and its risks. Patient is aware if he has any increase in his symptoms to call EMS.  Patient states he was recently diagnosed with cellulitis in his knee, he is being treated with an antibiotic and prednisone (patient is unsure of which antibiotic) he states the cellulitis has improved greatly (80% better) and he is feeling much better.  Patient will need a short fill: No short fill needed   Coordinated acute fill: No acute fill needed   Patient declined the following medications: No medications declined  Confirmed delivery date of 02/13/2022, advised patient that pharmacy will contact them the morning of delivery.   Care Gaps: AWV - scheduled 08/31/2022  Last BP - 149/77 on 01/26/2022 Last A1C - 6.3 on 11/21/2021 Covid vaccine - never done Foot exam - never done Eye exam - never done Malb - never done Shingrix - never done Pneumonia vaccine - postponed  Star Rating Drugs: None  [place] Pharmacist Assistant [phone]

## 2022-02-01 ENCOUNTER — Other Ambulatory Visit: Payer: Self-pay | Admitting: Family Medicine

## 2022-02-06 ENCOUNTER — Telehealth: Payer: Self-pay | Admitting: Family Medicine

## 2022-02-06 ENCOUNTER — Other Ambulatory Visit (HOSPITAL_COMMUNITY): Payer: Self-pay

## 2022-02-06 NOTE — Telephone Encounter (Signed)
Awaiting response from CT of  Chest on 01/29/22.

## 2022-02-06 NOTE — Telephone Encounter (Signed)
I had not received these results, but I just now looked at them. His lungs and chest are clear. No masses and no pneumonia are seen

## 2022-02-06 NOTE — Telephone Encounter (Signed)
Pt would like a call back to go over his labs  Please call [phone]

## 2022-02-06 NOTE — Telephone Encounter (Signed)
Spoke with patient about results of  CT Chest. Voiced understanding. Patient stated that he is currently still experiencing problems with the Cellulitis of his right knee.   He stated he has finished the prescription of Prednisone requesting another prescription.     Should patient schedule an appointment for an evaluation of his knee?

## 2022-02-07 NOTE — Telephone Encounter (Signed)
Make an OV please

## 2022-02-07 NOTE — Telephone Encounter (Signed)
Spoke with pt scheduled office visit for Friday per Dr Sarajane Jews

## 2022-02-08 NOTE — Progress Notes (Signed)
Cardiology Office Note:    Date:  02/09/2022   ID:  [name], DOB 02-02-1941, MRN [id number]  PCP:  Christopher Morale, MD  [place] [facility] Cardiologist:  Christopher Chandler, MD Electrophysiologist:  Christopher Axe, MD    Referring MD: Christopher Morale, MD   Chief Complaint:  Hospitalization Follow-up (Status post cardiac catheterization)    Patient Profile: Coronary artery disease  S/p DES to LAD and DES to RCA in 10/2014 Hx of shortness of breath due to Brilinta  (HFpEF) heart failure with preserved ejection fraction  Sinus node dysfunction s/p Pacemaker  Paroxsymal atrial fibrillation/flutter  Hypertension  Intol of Amlodipine due to edema  Hyperlipidemia  Intol of statins Declined ezetimibe; declined Lipid Clinic referral  Pre-diabetes  PVCs/PACs OSA  Hx of GI bleed due to AVM  Prior CV Studies: Cardiac catheterization 2022-02-10: LAD ostial 50, mid stent patent with 20 ISR, distal 70, 52 // LCx mid 30 // RCA proximal 20, mid 100 CTO; RPDA stent patent // normal R and L heart pressures (RA 7, RV 30/9/17, PA 32/17 mean 24, PCWP 9, LV 132/11/10, AO 134/82) >> PCI of mRCA unsuccessful>>med Rx Echocardiogram 02/10/22: EF 55-60, no RWMA, G1 DD, normal RVSF, trivial MR, AV sclerosis without stenosis Echocardiogram 08/07/21: EF 50-55, mild LVH, Gr 1 DD, normal RVSF, AV sclerosis w/o AS, mild dilastion of aortic root (40 mm), mild dilation of ascending aorta (39 mm) Myoview 01/20/2019: No ischemia or infarction, EF 56; low risk  Cardiac catheterization 11/26/2014: RCA prox 20, mid 42, mid to dist 78, dist stent patent // LCx mid 30 // LAD ost 50, mid stent patent, dist 25 // Normal EF   History of Present Illness:   [name] is a 80 y.o. male with the above problem list.  He was last seen 01/22/2022 with chest pain and shortness of breath.  He also noted he had not felt well since his pacemaker was implanted.  Interrogation of his device demonstrated normal function.  Cardiac catheterization was arranged for 2022/02/10.  This demonstrated a patent mid LAD stent.  The distal LAD was small in caliber with diffuse disease and not favorable for PCI.  The LCx had mild plaque.  The mid RCA was completely occluded and the RPDA stent was patent.  Right and left heart pressures were normal.  Attempted CTO PCI of the RCA was unsuccessful.  It was not felt that PCI of the distal LAD was a good option.  He was placed on ranolazine and admitted overnight for further evaluation.  Echocardiogram demonstrated normal LV function and no significant valve disease.  He was stable from a cardiac standpoint but continued to have complaints of knee pain.  X-ray demonstrated DJD but no acute changes.  Uric acid was normal.  Plan is to follow-up with primary care as an outpatient.  Of note, he has been referred to Dr. Martinique for consideration of CTO PCI of the RCA (appointment 02/23/22).  He returns for follow-up. He is here alone. He continues to feel poorly. He has no energy. He has frequent chest pain. If often feels like a heaviness and will last for hours. He has some chest pain now that started last night. He rates it as a 2/10. He has not taken NTG and does not usually take NTG for these symptoms. He has not had syncope. He cannot lay flat since an accident years ago. He has not had significant leg edema.     Past Medical  History:  Diagnosis Date   Adenomatous polyp of colon 2007   Arthritis    Atrial flutter ([place])    AVM (arteriovenous malformation) 2011   a. S/p argon plasma coagulation and ablation in 2011, 2017.   Bradycardia    a. H/o almost 7sec pause nocturnally during 2011 admission. Also has h/o fatigue with BB.   CAD (coronary artery disease)    a. Nonobst disease by cath 2009. b. s/p PCI 10/2014 with DES to [place], patent by relook 11/2014 (Brilinta changed to Plavix with improved sx).   Chronic diastolic CHF (congestive heart failure) (HCC)    Depression     Diverticulosis    Gastritis 2011   GERD (gastroesophageal reflux disease)    History of hiatal hernia    Hyperlipidemia    Hypertension    Hypothyroidism    Myocardial infarction ([place])    Obstructive sleep apnea    -no cpap use   Pre-diabetes    Premature atrial contractions Holter 2016   Presence of permanent cardiac pacemaker    PVC's (premature ventricular contractions) Holter 2016   Statin intolerance    Transfusion history    several years ago -GI bleed   Current Medications: Current Meds  Medication Sig   acetaminophen (TYLENOL) 500 MG tablet Take 1,000 mg by mouth every 6 (six) hours as needed for moderate pain.   apixaban (ELIQUIS) 5 MG TABS tablet Take 1 tablet (5 mg total) by mouth 2 (two) times daily.   Ascorbic Acid (VITAMIN C PO) Take 1-4 tablets by mouth See admin instructions. Take 1 tablet daily, may increase to 3-4 tabs daily as needed for immune support   Carboxymethylcellulose Sod PF (LUBRICANT EYE DROPS PF) 0.5 % SOLN Place 1 drop into both eyes 4 (four) times daily as needed (dry/irritated eyes.).   diclofenac sodium (VOLTAREN) 1 % GEL Apply 1 application topically daily as needed (ankle pain).   diltiazem (CARDIZEM CD) 120 MG 24 hr capsule TAKE 1 CAPSULE BY MOUTH EVERY DAY   fluticasone (FLONASE) 50 MCG/ACT nasal spray Place 1 spray into both nostrils daily as needed for allergies or rhinitis.   gabapentin (NEURONTIN) 100 MG capsule TAKE 1 CAPSULE BY MOUTH THREE TIMES A DAY   isosorbide mononitrate (IMDUR) 60 MG 24 hr tablet Take 1.5 tablets (90 mg total) by mouth daily.   ketoconazole (NIZORAL) 2 % cream Apply 1 application topically 2 (two) times daily as needed for irritation.   levothyroxine (SYNTHROID) 200 MCG tablet Take 1 tablet (200 mcg total) by mouth daily.   magnesium oxide (MAG-OX) 400 MG tablet Take 400 mg by mouth daily as needed (restless leg).   moxifloxacin (VIGAMOX) 0.5 % ophthalmic solution Place 1 drop into the right eye in the morning and at  bedtime.   nitroGLYCERIN (NITROSTAT) 0.4 MG SL tablet Place 1 tablet (0.4 mg total) under the tongue every 5 (five) minutes as needed for chest pain.   omeprazole (PRILOSEC) 40 MG capsule Take 1 capsule (40 mg total) by mouth daily.   oxycodone (ROXICODONE) 30 MG immediate release tablet Take 1 tablet (30 mg total) by mouth every 6 (six) hours as needed for pain.   potassium chloride SA (KLOR-CON M) 20 MEQ tablet Take 20 mEq by mouth daily.   ranolazine (RANEXA) 500 MG 12 hr tablet Take 1 tablet (500 mg total) by mouth 2 (two) times daily.   ropinirole (REQUIP) 5 MG tablet Take 1-2 tablets (5-10 mg total) by mouth See admin instructions. Take  5 mg in the morning and 10 mg at night, may take 5 mg up to 6 times a day as needed for restless legs   torsemide (DEMADEX) 20 MG tablet Take 0.5 tablets (10 mg total) by mouth See admin instructions. Take 10 mg daily, may take a second 10 mg dose an hour later if he doesn't use the bathroom   vitamin B-12 (CYANOCOBALAMIN) 100 MCG tablet Take 100 mcg by mouth daily.   [DISCONTINUED] isosorbide mononitrate (IMDUR) 60 MG 24 hr tablet Take 1 tablet (60 mg total) by mouth daily.    Allergies:   Brilinta [ticagrelor], Colchicine, Metoprolol tartrate, Mirapex [pramipexole dihydrochloride], Shellfish allergy, Statins, Zolpidem, Coconut (cocos nucifera), Lasix [furosemide], Levaquin [levofloxacin], and Trazodone and nefazodone   Social History   Tobacco Use   Smoking status: Former    Packs/day: 2.00    Years: 50.00    Total pack years: 100.00    Types: Cigarettes    Quit date: 07/23/2002    Years since quitting: 19.5    Passive exposure: Past   Smokeless tobacco: Never  Vaping Use   Vaping Use: Never used  Substance Use Topics   Alcohol use: No    Alcohol/week: 0.0 standard drinks of alcohol   Drug use: No    Family Hx: The patient's family history includes Alcoholism in his maternal grandfather and paternal uncle; Heart attack in his father; Heart  disease in his father; Leukemia in his mother; Stroke in his father. There is no history of Colon cancer or Esophageal cancer.  Review of Systems  Respiratory:  Positive for cough. Negative for sputum production.   Gastrointestinal:  Negative for hematochezia.  Genitourinary:  Negative for hematuria.     EKGs/Labs/Other Test Reviewed:    EKG:  EKG is   ordered today.  The ekg ordered today demonstrates sinus rhythm, HR 73, nonspecific ST-T wave changes, PACs, PVCs, QTc 423  Recent Labs: 03/30/2021: TSH 0.12 07/31/2021: Magnesium 1.8 01/22/2022: NT-Pro BNP 116 01/26/2022: BUN 11; Creatinine, Ser 1.15; Hemoglobin 14.7; Platelets 258; Potassium 4.6; Sodium 139   Recent Lipid Panel No results for input(s): "CHOL", "TRIG", "HDL", "VLDL", "LDLCALC", "LDLDIRECT" in the last 8760 hours.   Risk Assessment/Calculations/Metrics:    CHA2DS2-VASc Score = 4   This indicates a 4.8% annual risk of stroke. The patient's score is based upon: CHF History: 0 HTN History: 1 Diabetes History: 0 Stroke History: 0 Vascular Disease History: 1 Age Score: 2 Gender Score: 0             Physical Exam:    VS:  BP 118/60   Pulse 62   Ht '5\' 9"'$  (1.753 m)   Wt 222 lb 3.2 oz (100.8 kg)   SpO2 94%   BMI 32.81 kg/m     Wt Readings from Last 3 Encounters:  02/09/22 222 lb 3.2 oz (100.8 kg)  01/25/22 211 lb (95.7 kg)  01/22/22 219 lb 9.6 oz (99.6 kg)    Constitutional:      Appearance: Healthy appearance. Not in distress.  Neck:     Vascular: No JVR. JVD normal.  Pulmonary:     Effort: Pulmonary effort is normal.     Breath sounds: No wheezing. No rales.  Cardiovascular:     Normal rate. Irregular rhythm. Normal S1. Normal S2.      Murmurs: There is no murmur.     Comments: Right wrist without hematoma Edema:    Peripheral edema absent.  Abdominal:     Palpations: Abdomen  is soft.  Skin:    General: Skin is warm and dry.  Neurological:     General: No focal deficit present.     Mental  Status: Alert and oriented to person, place and time.         ASSESSMENT & PLAN:   Coronary artery disease involving native coronary artery of native heart with angina pectoris ([place]) History of DES to the LAD and DES to the RCA in 2016.  Recent cardiac catheterization demonstrated patent stent in the LAD and patent stent in the RPDA.  He has a chronically occluded mid RCA.  He also has distal disease in the LAD.  He has an evaluation with Dr. Martinique soon for CTO PCI of the RCA.  The patient continues to have significant symptoms of chest discomfort and shortness of breath as well as fatigue.  He does note a cough and states he may have pneumonia.  His lungs are clear on exam.  He had a recent CT scan that did not demonstrate any acute findings.  I offered a chest x-ray for him today but he prefers to forego this for now.  I will get a CBC with differential today.  If his cough continues or he feels worse, he will need follow-up with a chest x-ray.  His electrocardiogram does not demonstrate any acute changes.  His QT interval is acceptable with the addition of ranolazine.  I think his blood pressure could tolerate a further increase in his isosorbide.  He is not on antiplatelet therapy as he is on Eliquis. BMET, CBC with differential, TSH Continue ranolazine 500 mg twice daily, diltiazem 120 mg daily Increase isosorbide to 90 mg daily Consultation with Dr. Martinique August 4 for CTO PCI Follow-up with Dr. [name] 3 months.  (HFpEF) heart failure with preserved ejection fraction (HCC) Volume status is stable.  He had normal filling pressures on cardiac catheterization.  Continue current management with torsemide 10 mg daily.  Atrial fibrillation (HCC) Maintaining sinus rhythm.  Continue Eliquis 5 mg twice daily.  Hyperlipidemia LDL goal <70 Intolerant of statins.  He has declined alternative therapies.  Essential hypertension Blood pressure well controlled.  Continue diltiazem 120 mg daily.   Increase isosorbide to 90 mg daily for better anginal control.  Sinus node dysfunction s/p PPM Follow-up with EP as planned.  Leg pain He had significant knee pain in the hospital.  After discharge, he went to urgent care and was placed on antibiotics for cellulitis.  He just finished his antibiotics 2 days ago.  His knee pain is better.  Fatigue Obtain TSH along with CBC and BMET today.           Dispo:  Return in about 2 weeks (around 02/23/2022) for Consultation with Dr. Martinique.  Follow-up with Dr. [name] in 3 months..   Medication Adjustments/Labs and Tests Ordered: Current medicines are reviewed at length with the patient today.  Concerns regarding medicines are outlined above.  Tests Ordered: Orders Placed This Encounter  Procedures   Basic metabolic panel   CBC   TSH   EKG 12-Lead   Medication Changes: Meds ordered this encounter  Medications   isosorbide mononitrate (IMDUR) 60 MG 24 hr tablet    Sig: Take 1.5 tablets (90 mg total) by mouth daily.    Dispense:  135 tablet    Refill:  3   Signed, Richardson Dopp, PA-C  02/09/2022 9:10 AM    [address], [place], [place]  [address]  Phone: [phone]; Fax: [phone]

## 2022-02-09 ENCOUNTER — Encounter: Payer: Self-pay | Admitting: Physician Assistant

## 2022-02-09 ENCOUNTER — Other Ambulatory Visit (HOSPITAL_COMMUNITY): Payer: Self-pay

## 2022-02-09 ENCOUNTER — Encounter: Payer: Self-pay | Admitting: Family Medicine

## 2022-02-09 ENCOUNTER — Ambulatory Visit: Payer: Medicare HMO | Admitting: Physician Assistant

## 2022-02-09 ENCOUNTER — Ambulatory Visit (INDEPENDENT_AMBULATORY_CARE_PROVIDER_SITE_OTHER): Payer: Medicare HMO | Admitting: Family Medicine

## 2022-02-09 VITALS — BP 118/60 | HR 62 | Ht 69.0 in | Wt 222.2 lb

## 2022-02-09 VITALS — BP 136/68 | HR 89 | Temp 98.7°F | Wt 215.0 lb

## 2022-02-09 DIAGNOSIS — R5383 Other fatigue: Secondary | ICD-10-CM | POA: Diagnosis not present

## 2022-02-09 DIAGNOSIS — I5032 Chronic diastolic (congestive) heart failure: Secondary | ICD-10-CM | POA: Diagnosis not present

## 2022-02-09 DIAGNOSIS — M79605 Pain in left leg: Secondary | ICD-10-CM | POA: Diagnosis not present

## 2022-02-09 DIAGNOSIS — I10 Essential (primary) hypertension: Secondary | ICD-10-CM

## 2022-02-09 DIAGNOSIS — I495 Sick sinus syndrome: Secondary | ICD-10-CM | POA: Diagnosis not present

## 2022-02-09 DIAGNOSIS — I25119 Atherosclerotic heart disease of native coronary artery with unspecified angina pectoris: Secondary | ICD-10-CM

## 2022-02-09 DIAGNOSIS — E785 Hyperlipidemia, unspecified: Secondary | ICD-10-CM

## 2022-02-09 DIAGNOSIS — I480 Paroxysmal atrial fibrillation: Secondary | ICD-10-CM | POA: Diagnosis not present

## 2022-02-09 DIAGNOSIS — M109 Gout, unspecified: Secondary | ICD-10-CM | POA: Diagnosis not present

## 2022-02-09 MED ORDER — RANOLAZINE ER 500 MG PO TB12: 500.0000 mg | ORAL_TABLET | Freq: Two times a day (BID) | ORAL | 5 refills | Status: DC

## 2022-02-09 MED ORDER — PREDNISONE 10 MG PO TABS: ORAL_TABLET | ORAL | 0 refills | Status: DC

## 2022-02-09 MED ORDER — ISOSORBIDE MONONITRATE ER 60 MG PO TB24: 90.0000 mg | ORAL_TABLET | Freq: Every day | ORAL | 3 refills | Status: DC

## 2022-02-09 NOTE — Assessment & Plan Note (Addendum)
Maintaining sinus rhythm.  Continue Eliquis 5 mg twice daily 

## 2022-02-09 NOTE — Assessment & Plan Note (Signed)
Obtain TSH along with CBC and BMET today.

## 2022-02-09 NOTE — Patient Instructions (Signed)
Medication Instructions:  Your physician has recommended you make the following change in your medication:   INCREASE the Imdur to 60 mg taking 1 and 1/2 tablet daily   *If you need a refill on your cardiac medications before your next appointment, please call your pharmacy*   Lab Work: TODAY:  BMET, CBC, & TSH  If you have labs (blood work) drawn today and your tests are completely normal, you will receive your results only by: [place] (if you have MyChart) OR A paper copy in the mail If you have any lab test that is abnormal or we need to change your treatment, we will call you to review the results.   Testing/Procedures: None ordered   Follow-Up: At [facility], you and your health needs are our priority.  As part of our continuing mission to provide you with exceptional heart care, we have created designated Provider Care Teams.  These Care Teams include your primary Cardiologist (physician) and Advanced Practice Providers (APPs -  Physician Assistants and Nurse Practitioners) who all work together to provide you with the care you need, when you need it.  We recommend signing up for the patient portal called "MyChart".  Sign up information is provided on this After Visit Summary.  MyChart is used to connect with patients for Virtual Visits (Telemedicine).  Patients are able to view lab/test results, encounter notes, upcoming appointments, etc.  Non-urgent messages can be sent to your provider as well.   To learn more about what you can do with MyChart, go to [email or web address].    Your next appointment:   3 month(s)    The format for your next appointment:   In Person  Provider:   [name], MD     Other Instructions   Important Information About Sugar

## 2022-02-09 NOTE — Assessment & Plan Note (Signed)
Volume status is stable.  He had normal filling pressures on cardiac catheterization.  Continue current management with torsemide 10 mg daily.

## 2022-02-09 NOTE — Assessment & Plan Note (Signed)
Follow up with EP as planned. 

## 2022-02-09 NOTE — Assessment & Plan Note (Signed)
He had significant knee pain in the hospital.  After discharge, he went to urgent care and was placed on antibiotics for cellulitis.  He just finished his antibiotics 2 days ago.  His knee pain is better.

## 2022-02-09 NOTE — Assessment & Plan Note (Signed)
Intolerant of statins.  He has declined alternative therapies.

## 2022-02-09 NOTE — Progress Notes (Signed)
   Subjective:    Patient ID: [name], male    DOB: Jul 16, 1941, 81 y.o.   MRN: [id number]  HPI Here to follow up on a hospital stay from 01-25-22 to 01-26-22 for unstable angina and to follow up pain in the left knee. At the hospital he had a cardiac cath which showed a 90% stenosis at the distal LAD and a 100% stenosis at the mid RCA. It was felt that further attempts at placing stents was too risky, so they opted for medical management. He was started on Ranexa, and this has seemed to help his chest pressure and his overall energy level. Also while he was at the hospital he had swelling and pain in the left knee. Xrays were taken which were normal, and his WBC was normal at 10.0. there no signs of infection, but they did not have a definitive diagnosis. After his DC the knee got worse, so he went to an urgent care on 01-27-22, and they diagnosed it as "cellulitis". He has given 7 days of an antibiotic and a steroid, and the knee did improve. He still has some pain in it however. He has a long hx of gout in his feet, but he has never had in his knees before. No fever.    Review of Systems  Constitutional: Negative.   Respiratory: Negative.    Cardiovascular: Negative.   Musculoskeletal:  Positive for arthralgias.       Objective:   Physical Exam Constitutional:      Appearance: Normal appearance.     Comments: Walks with his cane   Cardiovascular:     Rate and Rhythm: Normal rate and regular rhythm.     Pulses: Normal pulses.     Heart sounds: Normal heart sounds.  Pulmonary:     Effort: Pulmonary effort is normal.     Breath sounds: Normal breath sounds.  Musculoskeletal:     Comments: The left knee in not swollen and there is no erythema. It is tender around the medial joint space and the knee is warm to touch. ROM is full   Neurological:     Mental Status: He is alert.           Assessment & Plan:  The knee pain is due to gout, not infection. I wrote for a Prednisone  taper for him to take over the next 15 days. Otherwise his CAD is being managed medically. I refilled the Ranexa, and he will follow up with Cardiology. We spent a total of (33   ) minutes reviewing records and discussing these issues.  [name], MD   [name], MD

## 2022-02-09 NOTE — Assessment & Plan Note (Signed)
History of DES to the LAD and DES to the RCA in 2016.  Recent cardiac catheterization demonstrated patent stent in the LAD and patent stent in the RPDA.  He has a chronically occluded mid RCA.  He also has distal disease in the LAD.  He has an evaluation with Dr. Martinique soon for CTO PCI of the RCA.  The patient continues to have significant symptoms of chest discomfort and shortness of breath as well as fatigue.  He does note a cough and states he may have pneumonia.  His lungs are clear on exam.  He had a recent CT scan that did not demonstrate any acute findings.  I offered a chest x-ray for him today but he prefers to forego this for now.  I will get a CBC with differential today.  If his cough continues or he feels worse, he will need follow-up with a chest x-ray.  His electrocardiogram does not demonstrate any acute changes.  His QT interval is acceptable with the addition of ranolazine.  I think his blood pressure could tolerate a further increase in his isosorbide.  He is not on antiplatelet therapy as he is on Eliquis.  BMET, CBC with differential, TSH  Continue ranolazine 500 mg twice daily, diltiazem 120 mg daily  Increase isosorbide to 90 mg daily  Consultation with Dr. Martinique August 4 for CTO PCI  Follow-up with Dr. [name] 3 months.

## 2022-02-09 NOTE — Assessment & Plan Note (Signed)
Blood pressure well controlled.  Continue diltiazem 120 mg daily.  Increase isosorbide to 90 mg daily for better anginal control.

## 2022-02-10 LAB — CBC
Hematocrit: 45.7 % (ref 37.5–51.0)
Hemoglobin: 14.9 g/dL (ref 13.0–17.7)
MCH: 29.3 pg (ref 26.6–33.0)
MCHC: 32.6 g/dL (ref 31.5–35.7)
MCV: 90 fL (ref 79–97)
Platelets: 275 10*3/uL (ref 150–450)
RBC: 5.08 x10E6/uL (ref 4.14–5.80)
RDW: 14.5 % (ref 11.6–15.4)
WBC: 14 10*3/uL — ABNORMAL HIGH (ref 3.4–10.8)

## 2022-02-10 LAB — BASIC METABOLIC PANEL
BUN/Creatinine Ratio: 17 (ref 10–24)
BUN: 18 mg/dL (ref 8–27)
CO2: 25 mmol/L (ref 20–29)
Calcium: 9.5 mg/dL (ref 8.6–10.2)
Chloride: 101 mmol/L (ref 96–106)
Creatinine, Ser: 1.04 mg/dL (ref 0.76–1.27)
Glucose: 117 mg/dL — ABNORMAL HIGH (ref 70–99)
Potassium: 4.6 mmol/L (ref 3.5–5.2)
Sodium: 140 mmol/L (ref 134–144)
eGFR: 73 mL/min/{1.73_m2} (ref >59)

## 2022-02-10 LAB — TSH: TSH: 0.045 u[IU]/mL — ABNORMAL LOW (ref 0.450–4.500)

## 2022-02-12 ENCOUNTER — Telehealth: Payer: Self-pay | Admitting: *Deleted

## 2022-02-12 MED ORDER — LEVOTHYROXINE SODIUM 175 MCG PO TABS: 175.0000 ug | ORAL_TABLET | Freq: Every day | ORAL | 1 refills | Status: AC

## 2022-02-12 NOTE — Telephone Encounter (Signed)
-----   Message from Liliane Shi, PA-C sent at 02/12/2022  6:25 AM EDT ----- Creatinine, K+ normal. Hgb normal. WBC elevated. TSH low. PLAN:  -WBC may be up due to prednisone. But, he did note a cough at recent OV. If cough continues, he needs to see PCP for CXR -Decrease Synthroid to 175 mcg once daily. -F/u with PCP in 4 weeks for repeat TSH. Send copy to PCP. Richardson Dopp, PA-C    02/12/2022 6:19 AM

## 2022-02-13 ENCOUNTER — Ambulatory Visit (INDEPENDENT_AMBULATORY_CARE_PROVIDER_SITE_OTHER): Payer: Medicare HMO

## 2022-02-13 DIAGNOSIS — I4892 Unspecified atrial flutter: Secondary | ICD-10-CM

## 2022-02-13 DIAGNOSIS — I10 Essential (primary) hypertension: Secondary | ICD-10-CM

## 2022-02-13 DIAGNOSIS — I25119 Atherosclerotic heart disease of native coronary artery with unspecified angina pectoris: Secondary | ICD-10-CM
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
## 2022-02-13 NOTE — Patient Instructions (Signed)
Visit Information Case closed goals met Thank you for allowing me to share the care management and care coordination services that are available to you as part of your health plan and services through your primary care provider and medical home. Please reach out to me at [phone] if the care management/care coordination team may be of assistance to you in the future.   Andriana Casa RN, BSN,CCM, CDE Care Management Coordinator [facility] [phone]   

## 2022-02-16 NOTE — Progress Notes (Signed)
Cardiology Office Note   Date:  02/23/2022   ID:  [name], DOB 1941-04-13, MRN [id number]  PCP:  [name], MD  Cardiologist:  Darlina Guys MD  Chief Complaint  Patient presents with   Coronary Artery Disease   Chest Pain   Shortness of Breath      History of Present Illness: [name] is a 81 y.o. male who is seen at the request of Dr [name] for consideration of CTO PCI. He is s/p stenting of the LAD and PDA in 2016. Over the last several mos, he notes exertional chest pain and shortness of breath. He has symptoms with just minimal activity.  He sometimes has chest pain at rest. He has to take NTG sometimes to improve his symptoms.  He sleeps on an incline chronically.  He has been awoken by shortness of breath at night as well as chest pain and takes NTG for relief.  He underwent cardiac cath on July 6 showing severe disease in the apical LAD. 70% mid to distal LAD, patent proximal- mid LAD stent and now occlusion of mid RCA with left to right collaterals. Attempted to cross with wire but unsuccessful. Right heart pressures were normal.  Medical therapy was intensified. Echo showed normal LV function. He has a history of HLD, HTN and Atrial fib/flutter and is s/p pacemaker for SSS.   Since his cardiac cath he has continued to experience chest pain responsive to Ntg, SOB and fatigue.     Past Medical History:  Diagnosis Date   Adenomatous polyp of colon 2007   Arthritis    Atrial flutter ([place])    AVM (arteriovenous malformation) 2011   a. S/p argon plasma coagulation and ablation in 2011, 2017.   Bradycardia    a. H/o almost 7sec pause nocturnally during 2011 admission. Also has h/o fatigue with BB.   CAD (coronary artery disease)    a. Nonobst disease by cath 2009. b. s/p PCI 10/2014 with DES to [place], patent by relook 11/2014 (Brilinta changed to Plavix with improved sx).   Chronic diastolic CHF (congestive heart failure) (HCC)    Depression     Diverticulosis    Gastritis 2011   GERD (gastroesophageal reflux disease)    History of hiatal hernia    Hyperlipidemia    Hypertension    Hypothyroidism    Myocardial infarction ([place])    Obstructive sleep apnea    -no cpap use   Pre-diabetes    Premature atrial contractions Holter 2016   Presence of permanent cardiac pacemaker    PVC's (premature ventricular contractions) Holter 2016   Statin intolerance    Transfusion history    several years ago -GI bleed    Past Surgical History:  Procedure Laterality Date   ANKLE SURGERY Left    APPENDECTOMY     CARDIAC CATHETERIZATION N/A 11/26/2014   Procedure: Left Heart Cath and Coronary Angiography;  Surgeon: Burnell Blanks, MD;  Location: [place] [facility];  Service: Cardiovascular;  Laterality: N/A;   CHOLECYSTECTOMY N/A 08/23/2016   Procedure: LAPAROSCOPIC CHOLECYSTECTOMY;  Surgeon: Coralie Keens, MD;  Location: [facility];  Service: General;  Laterality: N/A;   COLONOSCOPY  08-23-05   per Dr. Deatra Ina, adenomatous polyps, repeat in 5 yrs    COLONOSCOPY WITH PROPOFOL N/A 12/06/2015   Procedure: COLONOSCOPY WITH PROPOFOL;  Surgeon: Doran Stabler, MD;  Location: [facility];  Service: Gastroenterology;  Laterality: N/A;   CORONARY STENT INTERVENTION  N/A 01/25/2022   Procedure: CORONARY STENT INTERVENTION;  Surgeon: Burnell Blanks, MD;  Location: [place] [facility];  Service: Cardiovascular;  Laterality: N/A;   ENTEROSCOPY N/A 12/06/2015   Procedure: ENTEROSCOPY;  Surgeon: Doran Stabler, MD;  Location: [facility];  Service: Gastroenterology;  Laterality: N/A;   ESOPHAGOGASTRODUODENOSCOPY  08-23-05   per Dr. Deatra Ina, cauterized jejunal AVMs    [place] N/A 12/06/2015   Procedure: HOT HEMOSTASIS (ARGON PLASMA COAGULATION/BICAP);  Surgeon: Doran Stabler, MD;  Location: [facility];  Service: Gastroenterology;  Laterality: N/A;   INGUINAL HERNIA REPAIR Left 06/29/2021   Procedure: LAPAROSCOPIC LEFT  INGUINAL HERNIA REPAIR WITH MESH;  Surgeon: Ralene Ok, MD;  Location: [place];  Service: General;  Laterality: Left;   LEFT HEART CATHETERIZATION WITH CORONARY ANGIOGRAM N/A 09/08/2012   Procedure: LEFT HEART CATHETERIZATION WITH CORONARY ANGIOGRAM;  Surgeon: Burnell Blanks, MD;  Location: [facility];  Service: Cardiovascular;  Laterality: N/A;   LEFT HEART CATHETERIZATION WITH CORONARY ANGIOGRAM N/A 11/16/2014   Procedure: LEFT HEART CATHETERIZATION WITH CORONARY ANGIOGRAM;  Surgeon: Leonie Man, MD;  Location: [facility];  Service: Cardiovascular;  Laterality: N/A;   PACEMAKER IMPLANT N/A 12/24/2016   Procedure: Pacemaker Implant;  Surgeon: Deboraha Sprang, MD;  Location: [place] [facility];  Service: Cardiovascular;  Laterality: N/A;   RIGHT/LEFT HEART CATH AND CORONARY ANGIOGRAPHY N/A 01/25/2022   Procedure: RIGHT/LEFT HEART CATH AND CORONARY ANGIOGRAPHY;  Surgeon: Burnell Blanks, MD;  Location: [place] [facility];  Service: Cardiovascular;  Laterality: N/A;   SKIN GRAFT Right    leg   TONSILLECTOMY     UMBILICAL HERNIA REPAIR N/A 06/29/2021   Procedure: OPEN UMBILICAL HERNIA REPAIR WITH MESH;  Surgeon: Ralene Ok, MD;  Location: [place];  Service: General;  Laterality: N/A;     Current Outpatient Medications  Medication Sig Dispense Refill   acetaminophen (TYLENOL) 500 MG tablet Take 1,000 mg by mouth every 6 (six) hours as needed for moderate pain.     apixaban (ELIQUIS) 5 MG TABS tablet Take 1 tablet (5 mg total) by mouth 2 (two) times daily. 180 tablet 2   Ascorbic Acid (VITAMIN C PO) Take 1-4 tablets by mouth See admin instructions. Take 1 tablet daily, may increase to 3-4 tabs daily as needed for immune support     Carboxymethylcellulose Sod PF (LUBRICANT EYE DROPS PF) 0.5 % SOLN Place 1 drop into both eyes 4 (four) times daily as needed (dry/irritated eyes.).     clopidogrel (PLAVIX) 75 MG tablet Take 1 tablet (75 mg total) by mouth daily. 90 tablet 3    diclofenac sodium (VOLTAREN) 1 % GEL Apply 1 application topically daily as needed (ankle pain). 5 Tube 10   diltiazem (CARDIZEM CD) 120 MG 24 hr capsule TAKE 1 CAPSULE BY MOUTH EVERY DAY 90 capsule 3   fluticasone (FLONASE) 50 MCG/ACT nasal spray PLACE 1 SPRAY INTO BOTH NOSTRILS DAILY AS NEEDED FOR ALLERGIES OR RHINITIS. 48 mL 1   gabapentin (NEURONTIN) 100 MG capsule TAKE 1 CAPSULE BY MOUTH THREE TIMES A DAY 90 capsule 1   isosorbide mononitrate (IMDUR) 60 MG 24 hr tablet Take 1.5 tablets (90 mg total) by mouth daily. 135 tablet 3   ketoconazole (NIZORAL) 2 % cream Apply 1 application topically 2 (two) times daily as needed for irritation. 30 g 5   levothyroxine (SYNTHROID) 175 MCG tablet Take 1 tablet (175 mcg total) by mouth daily  before breakfast. 90 tablet 1   magnesium oxide (MAG-OX) 400 MG tablet Take 400 mg by mouth daily as needed (restless leg).     moxifloxacin (VIGAMOX) 0.5 % ophthalmic solution Place 1 drop into the right eye in the morning and at bedtime.     nitroGLYCERIN (NITROSTAT) 0.4 MG SL tablet Place 1 tablet (0.4 mg total) under the tongue every 5 (five) minutes as needed for chest pain. 25 tablet 5   omeprazole (PRILOSEC) 40 MG capsule Take 1 capsule (40 mg total) by mouth daily. 90 capsule 3   oxycodone (ROXICODONE) 30 MG immediate release tablet Take 1 tablet (30 mg total) by mouth every 6 (six) hours as needed for pain. 120 tablet 0   potassium chloride (KLOR-CON) 10 MEQ tablet Take 20 mEq by mouth daily.     predniSONE (DELTASONE) 10 MG tablet Take 5 tabs a day for 3 days, then 4 a day for 3 days, then 3 a day for 3 days, then 2 a day for 3 days, then 1 a day for 3 days, then stop 45 tablet 0   ranolazine (RANEXA) 500 MG 12 hr tablet Take 1 tablet (500 mg total) by mouth 2 (two) times daily. 60 tablet 5   ropinirole (REQUIP) 5 MG tablet Take 1-2 tablets (5-10 mg total) by mouth See admin instructions. Take 5 mg in the morning and 10 mg at night, may take 5 mg up to 6 times  a day as needed for restless legs     torsemide (DEMADEX) 20 MG tablet Take 0.5 tablets (10 mg total) by mouth See admin instructions. Take 10 mg daily, may take a second 10 mg dose an hour later if he doesn't use the bathroom     vitamin B-12 (CYANOCOBALAMIN) 100 MCG tablet Take 100 mcg by mouth daily.     No current facility-administered medications for this visit.    Allergies:   Brilinta [ticagrelor], Colchicine, Metoprolol tartrate, Mirapex [pramipexole dihydrochloride], Shellfish allergy, Statins, Zolpidem, Coconut (cocos nucifera), Lasix [furosemide], Levaquin [levofloxacin], and Trazodone and nefazodone    Social History:  The patient  reports that he quit smoking about 19 years ago. His smoking use included cigarettes. He has a 100.00 pack-year smoking history. He has been exposed to tobacco smoke. He has never used smokeless tobacco. He reports that he does not drink alcohol and does not use drugs.   Family History:  The patient's family history includes Alcoholism in his maternal grandfather and paternal uncle; Heart attack in his father; Heart disease in his father; Leukemia in his mother; Stroke in his father.    ROS:  Please see the history of present illness.   Otherwise, review of systems are positive for none.   All other systems are reviewed and negative.    PHYSICAL EXAM: VS:  BP 126/75   Pulse (!) 59   Ht '5\' 9"'$  (1.753 m)   Wt 227 lb 3.2 oz (103.1 kg)   SpO2 95%   BMI 33.55 kg/m  , BMI Body mass index is 33.55 kg/m. GEN: Well nourished, well developed, in no acute distress HEENT: normal Neck: no JVD, carotid bruits, or masses Cardiac: RRR; no murmurs, rubs, or gallops,no edema  Respiratory:  clear to auscultation bilaterally, normal work of breathing GI: soft, nontender, nondistended, + BS MS: no deformity or atrophy Skin: warm and dry, no rash Neuro:  Strength and sensation are intact Psych: euthymic mood, full affect   EKG:  EKG is not ordered today. The  ekg ordered today demonstrates N/A   Recent Labs: 07/31/2021: Magnesium 1.8 01/22/2022: NT-Pro BNP 116 02/09/2022: BUN 18; Creatinine, Ser 1.04; Hemoglobin 14.9; Platelets 275; Potassium 4.6; Sodium 140; TSH 0.045    Lipid Panel    Component Value Date/Time   CHOL 148 12/11/2017 0837   TRIG 113 12/11/2017 0837   HDL 38 (L) 12/11/2017 0837   CHOLHDL 3.9 12/11/2017 0837   CHOLHDL 4.6 03/22/2016 0935   VLDL 46 (H) 03/22/2016 0935   LDLCALC 87 12/11/2017 0837      Wt Readings from Last 3 Encounters:  02/23/22 227 lb 3.2 oz (103.1 kg)  02/09/22 215 lb (97.5 kg)  02/09/22 222 lb 3.2 oz (100.8 kg)      Other studies Reviewed: Additional studies/ records that were reviewed today include:   Cardiac cath 01/25/22: Procedures  CORONARY STENT INTERVENTION  RIGHT/LEFT HEART CATH AND CORONARY ANGIOGRAPHY   Conclusion      Ost LAD to Prox LAD lesion is 50% stenosed.   Mid Cx lesion is 30% stenosed.   Prox RCA lesion is 20% stenosed.   Mid RCA lesion is 100% stenosed.   Mid LAD lesion is 20% stenosed.   Dist LAD-1 lesion is 70% stenosed.   Dist LAD-2 lesion is 90% stenosed.   Previously placed RPDA stent of unknown type is  widely patent.   Patent mid LAD stent. The distal LAD becomes small in caliber and has diffuse disease, not favorable for PCI given vessel size and diffuse nature of disease.  The Circumflex has mild plaque The dominant RCA has a 100% mid occlusion (CTO). The distal branches fill from left to right collaterals.  4.  Attempted PCI of the mid RCA CTO, unsuccessful 5.  Normal right and left heart pressures (RA 7, RV 30/9/17, PA 32/17 mean 24, PCWP 9, LV 132/11/10, AO 134/82)   Recommendations: Mr. Colmenares has many complaints including dyspnea and chest pressure. No favorable targets for PCI. The RCA is now completely occluded but fills from left to right collaterals. Attempted CTO PCI unsuccessful. I do not think PCI of the distal LAD is a good option. For now, will  continue medical management of CAD. Will admit to telemetry overnight given many complaints. Will arrange an echo today. Will add Ranexa 500 mg po BID. I will have our CTO team review his films but I question whether he would have symptomatic improvement with PCI of the RCA.    Diagnostic Dominance: Right  Intervent  Echo 01/25/22: IMPRESSIONS     1. Left ventricular ejection fraction, by estimation, is 55 to 60%. The  left ventricle has normal function. The left ventricle has no regional  wall motion abnormalities. Left ventricular diastolic parameters are  consistent with Grade I diastolic  dysfunction (impaired relaxation).   2. Right ventricular systolic function is normal. The right ventricular  size is mildly enlarged.   3. The mitral valve is normal in structure. Trivial mitral valve  regurgitation. No evidence of mitral stenosis.   4. The aortic valve is tricuspid. There is mild calcification of the  aortic valve. There is mild thickening of the aortic valve. Aortic valve  regurgitation is not visualized. Aortic valve sclerosis is present, with  no evidence of aortic valve stenosis.  Aortic valve mean gradient measures 4.0 mmHg. Aortic valve Vmax measures  1.33 m/s.   5. The inferior vena cava is normal in size with greater than 50%  respiratory variability, suggesting right atrial pressure of 3 mmHg.  Comparison(s): Prior images reviewed side by side.   ASSESSMENT AND PLAN:  1.  CAD with remote stenting of the proximal to mid LAD and PDA in 2016. Recently had repeat cath showing new occlusion of the distal RCA at the crux. There are left to right collaterals. He is symptomatic despite optimal medical therapy. Unsuccessful PCI before- unable to cross with wire. Discussed potential for CTO PCI. Chance of success 75-80%. Discussed need for dual arterial access, overnight stay in the hospital. Will need to initiate Plavix. Hold Eliquis 2 days prior to procedure. Discussed risk of  procedure extensively. The procedure and risks were reviewed including but not limited to death, myocardial infarction, stroke, arrythmias, perforation, bleeding, transfusion, emergency surgery, dye allergy, or renal dysfunction. The patient voices understanding and is agreeable to proceed. Will schedule for next Wednesday August 9. 2. HFpEF. Right heart hemodynamics looked good on recent cath 3. PAfib 4. HLD intolerant of statins 5. HTN 6. Sinus node dysfunction s/p PPM    Current medicines are reviewed at length with the patient today.  The patient does not have concerns regarding medicines.  The following changes have been made:  add Plavix 75 mg daily  Labs/ tests ordered today include:   Orders Placed This Encounter  Procedures   Basic metabolic panel   CBC w/Diff/Platelet         Disposition:   CTO PCI on August 9.   Signed, Janiqua Friscia Martinique, MD  02/23/2022 1:26 PM    [facility] [address], [place], [place], [address] Phone [phone], Fax [phone]

## 2022-02-16 NOTE — H&P (View-Only) (Signed)
Cardiology Office Note   Date:  02/23/2022   ID:  [name], DOB 03-May-1941, MRN [id number]  PCP:  [name], MD  Cardiologist:  Darlina Guys MD  Chief Complaint  Patient presents with   Coronary Artery Disease   Chest Pain   Shortness of Breath      History of Present Illness: [name] is a 81 y.o. male who is seen at the request of Dr [name] for consideration of CTO PCI. He is s/p stenting of the LAD and PDA in 2016. Over the last several mos, he notes exertional chest pain and shortness of breath. He has symptoms with just minimal activity.  He sometimes has chest pain at rest. He has to take NTG sometimes to improve his symptoms.  He sleeps on an incline chronically.  He has been awoken by shortness of breath at night as well as chest pain and takes NTG for relief.  He underwent cardiac cath on July 6 showing severe disease in the apical LAD. 70% mid to distal LAD, patent proximal- mid LAD stent and now occlusion of mid RCA with left to right collaterals. Attempted to cross with wire but unsuccessful. Right heart pressures were normal.  Medical therapy was intensified. Echo showed normal LV function. He has a history of HLD, HTN and Atrial fib/flutter and is s/p pacemaker for SSS.   Since his cardiac cath he has continued to experience chest pain responsive to Ntg, SOB and fatigue.     Past Medical History:  Diagnosis Date   Adenomatous polyp of colon 2007   Arthritis    Atrial flutter ([place])    AVM (arteriovenous malformation) 2011   a. S/p argon plasma coagulation and ablation in 2011, 2017.   Bradycardia    a. H/o almost 7sec pause nocturnally during 2011 admission. Also has h/o fatigue with BB.   CAD (coronary artery disease)    a. Nonobst disease by cath 2009. b. s/p PCI 10/2014 with DES to [place], patent by relook 11/2014 (Brilinta changed to Plavix with improved sx).   Chronic diastolic CHF (congestive heart failure) (HCC)    Depression     Diverticulosis    Gastritis 2011   GERD (gastroesophageal reflux disease)    History of hiatal hernia    Hyperlipidemia    Hypertension    Hypothyroidism    Myocardial infarction ([place])    Obstructive sleep apnea    -no cpap use   Pre-diabetes    Premature atrial contractions Holter 2016   Presence of permanent cardiac pacemaker    PVC's (premature ventricular contractions) Holter 2016   Statin intolerance    Transfusion history    several years ago -GI bleed    Past Surgical History:  Procedure Laterality Date   ANKLE SURGERY Left    APPENDECTOMY     CARDIAC CATHETERIZATION N/A 11/26/2014   Procedure: Left Heart Cath and Coronary Angiography;  Surgeon: Burnell Blanks, MD;  Location: [facility];  Service: Cardiovascular;  Laterality: N/A;   CHOLECYSTECTOMY N/A 08/23/2016   Procedure: LAPAROSCOPIC CHOLECYSTECTOMY;  Surgeon: Coralie Keens, MD;  Location: [facility];  Service: General;  Laterality: N/A;   COLONOSCOPY  08-23-05   per Dr. Deatra Ina, adenomatous polyps, repeat in 5 yrs    COLONOSCOPY WITH PROPOFOL N/A 12/06/2015   Procedure: COLONOSCOPY WITH PROPOFOL;  Surgeon: Doran Stabler, MD;  Location: [facility];  Service: Gastroenterology;  Laterality: N/A;   CORONARY STENT INTERVENTION  N/A 01/25/2022   Procedure: CORONARY STENT INTERVENTION;  Surgeon: Burnell Blanks, MD;  Location: [facility];  Service: Cardiovascular;  Laterality: N/A;   ENTEROSCOPY N/A 12/06/2015   Procedure: ENTEROSCOPY;  Surgeon: Doran Stabler, MD;  Location: [facility];  Service: Gastroenterology;  Laterality: N/A;   ESOPHAGOGASTRODUODENOSCOPY  08-23-05   per Dr. Deatra Ina, cauterized jejunal AVMs    [place] N/A 12/06/2015   Procedure: HOT HEMOSTASIS (ARGON PLASMA COAGULATION/BICAP);  Surgeon: Doran Stabler, MD;  Location: [facility];  Service: Gastroenterology;  Laterality: N/A;   INGUINAL HERNIA REPAIR Left 06/29/2021   Procedure: LAPAROSCOPIC LEFT  INGUINAL HERNIA REPAIR WITH MESH;  Surgeon: Ralene Ok, MD;  Location: [facility];  Service: General;  Laterality: Left;   LEFT HEART CATHETERIZATION WITH CORONARY ANGIOGRAM N/A 09/08/2012   Procedure: LEFT HEART CATHETERIZATION WITH CORONARY ANGIOGRAM;  Surgeon: Burnell Blanks, MD;  Location: [facility];  Service: Cardiovascular;  Laterality: N/A;   LEFT HEART CATHETERIZATION WITH CORONARY ANGIOGRAM N/A 11/16/2014   Procedure: LEFT HEART CATHETERIZATION WITH CORONARY ANGIOGRAM;  Surgeon: Leonie Man, MD;  Location: [facility];  Service: Cardiovascular;  Laterality: N/A;   PACEMAKER IMPLANT N/A 12/24/2016   Procedure: Pacemaker Implant;  Surgeon: Deboraha Sprang, MD;  Location: [facility];  Service: Cardiovascular;  Laterality: N/A;   RIGHT/LEFT HEART CATH AND CORONARY ANGIOGRAPHY N/A 01/25/2022   Procedure: RIGHT/LEFT HEART CATH AND CORONARY ANGIOGRAPHY;  Surgeon: Burnell Blanks, MD;  Location: [facility];  Service: Cardiovascular;  Laterality: N/A;   SKIN GRAFT Right    leg   TONSILLECTOMY     UMBILICAL HERNIA REPAIR N/A 06/29/2021   Procedure: OPEN UMBILICAL HERNIA REPAIR WITH MESH;  Surgeon: Ralene Ok, MD;  Location: [facility];  Service: General;  Laterality: N/A;     Current Outpatient Medications  Medication Sig Dispense Refill   acetaminophen (TYLENOL) 500 MG tablet Take 1,000 mg by mouth every 6 (six) hours as needed for moderate pain.     apixaban (ELIQUIS) 5 MG TABS tablet Take 1 tablet (5 mg total) by mouth 2 (two) times daily. 180 tablet 2   Ascorbic Acid (VITAMIN C PO) Take 1-4 tablets by mouth See admin instructions. Take 1 tablet daily, may increase to 3-4 tabs daily as needed for immune support     Carboxymethylcellulose Sod PF (LUBRICANT EYE DROPS PF) 0.5 % SOLN Place 1 drop into both eyes 4 (four) times daily as needed (dry/irritated eyes.).     clopidogrel (PLAVIX) 75 MG tablet Take 1 tablet (75 mg total) by mouth daily. 90 tablet 3    diclofenac sodium (VOLTAREN) 1 % GEL Apply 1 application topically daily as needed (ankle pain). 5 Tube 10   diltiazem (CARDIZEM CD) 120 MG 24 hr capsule TAKE 1 CAPSULE BY MOUTH EVERY DAY 90 capsule 3   fluticasone (FLONASE) 50 MCG/ACT nasal spray PLACE 1 SPRAY INTO BOTH NOSTRILS DAILY AS NEEDED FOR ALLERGIES OR RHINITIS. 48 mL 1   gabapentin (NEURONTIN) 100 MG capsule TAKE 1 CAPSULE BY MOUTH THREE TIMES A DAY 90 capsule 1   isosorbide mononitrate (IMDUR) 60 MG 24 hr tablet Take 1.5 tablets (90 mg total) by mouth daily. 135 tablet 3   ketoconazole (NIZORAL) 2 % cream Apply 1 application topically 2 (two) times daily as needed for irritation. 30 g 5   levothyroxine (SYNTHROID) 175 MCG tablet Take 1 tablet (175 mcg total) by mouth daily  before breakfast. 90 tablet 1   magnesium oxide (MAG-OX) 400 MG tablet Take 400 mg by mouth daily as needed (restless leg).     moxifloxacin (VIGAMOX) 0.5 % ophthalmic solution Place 1 drop into the right eye in the morning and at bedtime.     nitroGLYCERIN (NITROSTAT) 0.4 MG SL tablet Place 1 tablet (0.4 mg total) under the tongue every 5 (five) minutes as needed for chest pain. 25 tablet 5   omeprazole (PRILOSEC) 40 MG capsule Take 1 capsule (40 mg total) by mouth daily. 90 capsule 3   oxycodone (ROXICODONE) 30 MG immediate release tablet Take 1 tablet (30 mg total) by mouth every 6 (six) hours as needed for pain. 120 tablet 0   potassium chloride (KLOR-CON) 10 MEQ tablet Take 20 mEq by mouth daily.     predniSONE (DELTASONE) 10 MG tablet Take 5 tabs a day for 3 days, then 4 a day for 3 days, then 3 a day for 3 days, then 2 a day for 3 days, then 1 a day for 3 days, then stop 45 tablet 0   ranolazine (RANEXA) 500 MG 12 hr tablet Take 1 tablet (500 mg total) by mouth 2 (two) times daily. 60 tablet 5   ropinirole (REQUIP) 5 MG tablet Take 1-2 tablets (5-10 mg total) by mouth See admin instructions. Take 5 mg in the morning and 10 mg at night, may take 5 mg up to 6 times  a day as needed for restless legs     torsemide (DEMADEX) 20 MG tablet Take 0.5 tablets (10 mg total) by mouth See admin instructions. Take 10 mg daily, may take a second 10 mg dose an hour later if he doesn't use the bathroom     vitamin B-12 (CYANOCOBALAMIN) 100 MCG tablet Take 100 mcg by mouth daily.     No current facility-administered medications for this visit.    Allergies:   Brilinta [ticagrelor], Colchicine, Metoprolol tartrate, Mirapex [pramipexole dihydrochloride], Shellfish allergy, Statins, Zolpidem, Coconut (cocos nucifera), Lasix [furosemide], Levaquin [levofloxacin], and Trazodone and nefazodone    Social History:  The patient  reports that he quit smoking about 19 years ago. His smoking use included cigarettes. He has a 100.00 pack-year smoking history. He has been exposed to tobacco smoke. He has never used smokeless tobacco. He reports that he does not drink alcohol and does not use drugs.   Family History:  The patient's family history includes Alcoholism in his maternal grandfather and paternal uncle; Heart attack in his father; Heart disease in his father; Leukemia in his mother; Stroke in his father.    ROS:  Please see the history of present illness.   Otherwise, review of systems are positive for none.   All other systems are reviewed and negative.    PHYSICAL EXAM: VS:  BP 126/75   Pulse (!) 59   Ht '5\' 9"'$  (1.753 m)   Wt 227 lb 3.2 oz (103.1 kg)   SpO2 95%   BMI 33.55 kg/m  , BMI Body mass index is 33.55 kg/m. GEN: Well nourished, well developed, in no acute distress HEENT: normal Neck: no JVD, carotid bruits, or masses Cardiac: RRR; no murmurs, rubs, or gallops,no edema  Respiratory:  clear to auscultation bilaterally, normal work of breathing GI: soft, nontender, nondistended, + BS MS: no deformity or atrophy Skin: warm and dry, no rash Neuro:  Strength and sensation are intact Psych: euthymic mood, full affect   EKG:  EKG is not ordered today. The  ekg ordered today demonstrates N/A   Recent Labs: 07/31/2021: Magnesium 1.8 01/22/2022: NT-Pro BNP 116 02/09/2022: BUN 18; Creatinine, Ser 1.04; Hemoglobin 14.9; Platelets 275; Potassium 4.6; Sodium 140; TSH 0.045    Lipid Panel    Component Value Date/Time   CHOL 148 12/11/2017 0837   TRIG 113 12/11/2017 0837   HDL 38 (L) 12/11/2017 0837   CHOLHDL 3.9 12/11/2017 0837   CHOLHDL 4.6 03/22/2016 0935   VLDL 46 (H) 03/22/2016 0935   LDLCALC 87 12/11/2017 0837      Wt Readings from Last 3 Encounters:  02/23/22 227 lb 3.2 oz (103.1 kg)  02/09/22 215 lb (97.5 kg)  02/09/22 222 lb 3.2 oz (100.8 kg)      Other studies Reviewed: Additional studies/ records that were reviewed today include:   Cardiac cath 01/25/22: Procedures  CORONARY STENT INTERVENTION  RIGHT/LEFT HEART CATH AND CORONARY ANGIOGRAPHY   Conclusion      Ost LAD to Prox LAD lesion is 50% stenosed.   Mid Cx lesion is 30% stenosed.   Prox RCA lesion is 20% stenosed.   Mid RCA lesion is 100% stenosed.   Mid LAD lesion is 20% stenosed.   Dist LAD-1 lesion is 70% stenosed.   Dist LAD-2 lesion is 90% stenosed.   Previously placed RPDA stent of unknown type is  widely patent.   Patent mid LAD stent. The distal LAD becomes small in caliber and has diffuse disease, not favorable for PCI given vessel size and diffuse nature of disease.  The Circumflex has mild plaque The dominant RCA has a 100% mid occlusion (CTO). The distal branches fill from left to right collaterals.  4.  Attempted PCI of the mid RCA CTO, unsuccessful 5.  Normal right and left heart pressures (RA 7, RV 30/9/17, PA 32/17 mean 24, PCWP 9, LV 132/11/10, AO 134/82)   Recommendations: [name] has many complaints including dyspnea and chest pressure. No favorable targets for PCI. The RCA is now completely occluded but fills from left to right collaterals. Attempted CTO PCI unsuccessful. I do not think PCI of the distal LAD is a good option. For now, will  continue medical management of CAD. Will admit to telemetry overnight given many complaints. Will arrange an echo today. Will add Ranexa 500 mg po BID. I will have our CTO team review his films but I question whether he would have symptomatic improvement with PCI of the RCA.    Diagnostic Dominance: Right  Intervent  Echo 01/25/22: IMPRESSIONS     1. Left ventricular ejection fraction, by estimation, is 55 to 60%. The  left ventricle has normal function. The left ventricle has no regional  wall motion abnormalities. Left ventricular diastolic parameters are  consistent with Grade I diastolic  dysfunction (impaired relaxation).   2. Right ventricular systolic function is normal. The right ventricular  size is mildly enlarged.   3. The mitral valve is normal in structure. Trivial mitral valve  regurgitation. No evidence of mitral stenosis.   4. The aortic valve is tricuspid. There is mild calcification of the  aortic valve. There is mild thickening of the aortic valve. Aortic valve  regurgitation is not visualized. Aortic valve sclerosis is present, with  no evidence of aortic valve stenosis.  Aortic valve mean gradient measures 4.0 mmHg. Aortic valve Vmax measures  1.33 m/s.   5. The inferior vena cava is normal in size with greater than 50%  respiratory variability, suggesting right atrial pressure of 3 mmHg.  Comparison(s): Prior images reviewed side by side.   ASSESSMENT AND PLAN:  1.  CAD with remote stenting of the proximal to mid LAD and PDA in 2016. Recently had repeat cath showing new occlusion of the distal RCA at the crux. There are left to right collaterals. He is symptomatic despite optimal medical therapy. Unsuccessful PCI before- unable to cross with wire. Discussed potential for CTO PCI. Chance of success 75-80%. Discussed need for dual arterial access, overnight stay in the hospital. Will need to initiate Plavix. Hold Eliquis 2 days prior to procedure. Discussed risk of  procedure extensively. The procedure and risks were reviewed including but not limited to death, myocardial infarction, stroke, arrythmias, perforation, bleeding, transfusion, emergency surgery, dye allergy, or renal dysfunction. The patient voices understanding and is agreeable to proceed. Will schedule for next Wednesday August 9. 2. HFpEF. Right heart hemodynamics looked good on recent cath 3. PAfib 4. HLD intolerant of statins 5. HTN 6. Sinus node dysfunction s/p PPM    Current medicines are reviewed at length with the patient today.  The patient does not have concerns regarding medicines.  The following changes have been made:  add Plavix 75 mg daily  Labs/ tests ordered today include:   Orders Placed This Encounter  Procedures   Basic metabolic panel   CBC w/Diff/Platelet         Disposition:   CTO PCI on August 9.   Signed, Elven Laboy Martinique, MD  02/23/2022 1:26 PM    [facility] [address], [place], [place], [address] Phone [phone], Fax [phone]

## 2022-02-19 DIAGNOSIS — I25119 Atherosclerotic heart disease of native coronary artery with unspecified angina pectoris: Secondary | ICD-10-CM | POA: Diagnosis not present

## 2022-02-19 DIAGNOSIS — I10 Essential (primary) hypertension: Secondary | ICD-10-CM | POA: Diagnosis not present

## 2022-02-22 ENCOUNTER — Other Ambulatory Visit: Payer: Self-pay | Admitting: Family Medicine

## 2022-02-23 ENCOUNTER — Encounter: Payer: Self-pay | Admitting: Cardiology

## 2022-02-23 ENCOUNTER — Other Ambulatory Visit: Payer: Self-pay | Admitting: Cardiology

## 2022-02-23 ENCOUNTER — Ambulatory Visit: Payer: Medicare HMO | Admitting: Cardiology

## 2022-02-23 VITALS — BP 126/75 | HR 59 | Ht 69.0 in | Wt 227.2 lb

## 2022-02-23 DIAGNOSIS — I25118 Atherosclerotic heart disease of native coronary artery with other forms of angina pectoris: Secondary | ICD-10-CM

## 2022-02-23 DIAGNOSIS — I10 Essential (primary) hypertension: Secondary | ICD-10-CM

## 2022-02-23 DIAGNOSIS — I25119 Atherosclerotic heart disease of native coronary artery with unspecified angina pectoris: Secondary | ICD-10-CM

## 2022-02-23 DIAGNOSIS — I480 Paroxysmal atrial fibrillation: Secondary | ICD-10-CM

## 2022-02-23 DIAGNOSIS — E785 Hyperlipidemia, unspecified: Secondary | ICD-10-CM

## 2022-02-23 MED ORDER — CLOPIDOGREL BISULFATE 75 MG PO TABS: 75.0000 mg | ORAL_TABLET | Freq: Every day | ORAL | 3 refills | Status: DC

## 2022-02-23 MED ORDER — SODIUM CHLORIDE 0.9% FLUSH: 3.0000 mL | Freq: Two times a day (BID) | INTRAVENOUS | Status: DC

## 2022-02-23 NOTE — Patient Instructions (Addendum)
Medication Instructions:  Hold Eliquis 2 days before cath Continue all other medications *If you need a refill on your cardiac medications before your next appointment, please call your pharmacy*   Lab Work: Bmet,Cbc today   Testing/Procedures: Cardiac Cath   ( CTO )   Follow instructions below   Follow-Up: At [facility], you and your health needs are our priority.  As part of our continuing mission to provide you with exceptional heart care, we have created designated Provider Care Teams.  These Care Teams include your primary Cardiologist (physician) and Advanced Practice Providers (APPs -  Physician Assistants and Nurse Practitioners) who all work together to provide you with the care you need, when you need it.  We recommend signing up for the patient portal called "MyChart".  Sign up information is provided on this After Visit Summary.  MyChart is used to connect with patients for Virtual Visits (Telemedicine).  Patients are able to view lab/test results, encounter notes, upcoming appointments, etc.  Non-urgent messages can be sent to your provider as well.   To learn more about what you can do with MyChart, go to [email or web address].       Your next appointment:      The format for your next appointment: Office   Provider:  [place] [address] Dept: [phone] Loc: [place]  02/23/2022  You are scheduled for a Cardiac Catheterization  ( CTO )  on Wednesday, August 9 with Dr. Peter Martinique.  1. Please arrive at the Main Entrance A at [facility]: [place], [place] [address] at 6:30 AM (This time is two hours before your procedure to ensure your preparation). Free valet parking service is available.   Special note: Every effort is made to have your procedure done on time. Please  understand that emergencies sometimes delay scheduled procedures.  2. Diet: Do not eat solid foods after midnight.  You may have clear liquids until 5 AM upon the day of the procedure.  3. Labs: You will need to have blood drawn on  today .You do not need to be fasting.  4. Medication instructions in preparation for your procedure:   Hold Eliquis 2 days before cath Start holding Mon 8/7.   Hold Torsemide morning of Cath         On the morning of your procedure, take Aspirin 81 mg and Plavix and any morning medicines NOT listed above.  You may use sips of water.  5. Plan to go home the same day, you will only stay overnight if medically necessary. 6. You MUST have a responsible adult to drive you home. 7. An adult MUST be with you the first 24 hours after you arrive home. 8. Bring a current list of your medications, and the last time and date medication taken. 9. Bring ID and current insurance cards. 10.Please wear clothes that are easy to get on and off and wear slip-on shoes.  Thank you for allowing [place] to care for you!   -- [facility]    Important Information About Sugar

## 2022-02-24 LAB — CBC WITH DIFFERENTIAL/PLATELET
Basophils Absolute: 0.1 10*3/uL (ref 0.0–0.2)
Basos: 1 %
EOS (ABSOLUTE): 0.5 10*3/uL — ABNORMAL HIGH (ref 0.0–0.4)
Eos: 5 %
Hematocrit: 45.3 % (ref 37.5–51.0)
Hemoglobin: 15.1 g/dL (ref 13.0–17.7)
Immature Grans (Abs): 0.2 10*3/uL — ABNORMAL HIGH (ref 0.0–0.1)
Immature Granulocytes: 2 %
Lymphocytes Absolute: 2.6 10*3/uL (ref 0.7–3.1)
Lymphs: 24 %
MCH: 30 pg (ref 26.6–33.0)
MCHC: 33.3 g/dL (ref 31.5–35.7)
MCV: 90 fL (ref 79–97)
Monocytes Absolute: 1.1 10*3/uL — ABNORMAL HIGH (ref 0.1–0.9)
Monocytes: 9 %
Neutrophils Absolute: 6.7 10*3/uL (ref 1.4–7.0)
Neutrophils: 59 %
Platelets: 234 10*3/uL (ref 150–450)
RBC: 5.04 x10E6/uL (ref 4.14–5.80)
RDW: 15.4 % (ref 11.6–15.4)
WBC: 11.2 10*3/uL — ABNORMAL HIGH (ref 3.4–10.8)

## 2022-02-24 LAB — BASIC METABOLIC PANEL
BUN/Creatinine Ratio: 13 (ref 10–24)
BUN: 12 mg/dL (ref 8–27)
CO2: 25 mmol/L (ref 20–29)
Calcium: 9.5 mg/dL (ref 8.6–10.2)
Chloride: 103 mmol/L (ref 96–106)
Creatinine, Ser: 0.95 mg/dL (ref 0.76–1.27)
Glucose: 102 mg/dL — ABNORMAL HIGH (ref 70–99)
Potassium: 4.3 mmol/L (ref 3.5–5.2)
Sodium: 145 mmol/L — ABNORMAL HIGH (ref 134–144)
eGFR: 81 mL/min/{1.73_m2} (ref >59)

## 2022-02-27 ENCOUNTER — Encounter: Payer: Self-pay | Admitting: Family Medicine

## 2022-02-27 ENCOUNTER — Other Ambulatory Visit: Payer: Self-pay | Admitting: Cardiology

## 2022-02-27 ENCOUNTER — Telehealth (INDEPENDENT_AMBULATORY_CARE_PROVIDER_SITE_OTHER): Payer: Medicare HMO | Admitting: Family Medicine

## 2022-02-27 DIAGNOSIS — F1190 Opioid use, unspecified, uncomplicated: Secondary | ICD-10-CM | POA: Diagnosis not present

## 2022-02-27 DIAGNOSIS — M5440 Lumbago with sciatica, unspecified side: Secondary | ICD-10-CM | POA: Diagnosis not present

## 2022-02-27 MED ORDER — OXYCODONE HCL 30 MG PO TABS: 30.0000 mg | ORAL_TABLET | Freq: Four times a day (QID) | ORAL | 0 refills | Status: AC | PRN

## 2022-02-27 MED ORDER — OXYCODONE HCL 30 MG PO TABS: 30.0000 mg | ORAL_TABLET | Freq: Four times a day (QID) | ORAL | 0 refills | Status: DC | PRN

## 2022-02-27 NOTE — Addendum Note (Signed)
Addended by: [name] A on: 02/27/2022 01:51 PM   Modules accepted: Orders

## 2022-02-27 NOTE — Progress Notes (Signed)
   Subjective:    Patient ID: [name], male    DOB: 06/12/1941, 81 y.o.   MRN: [id number]  HPI Virtual Visit via Telephone Note  I connected with the patient on 02/27/22 at  1:30 PM EDT by telephone and verified that I am speaking with the correct person using two identifiers.   I discussed the limitations, risks, security and privacy concerns of performing an evaluation and management service by telephone and the availability of in person appointments. I also discussed with the patient that there may be a patient responsible charge related to this service. The patient expressed understanding and agreed to proceed.  Location patient: home Location provider: work or home office Participants present for the call: patient, provider Patient did not have a visit in the prior 7 days to address this/these issue(s).   History of Present Illness: Here for pain management. His pain has been fairly stable. We will refill this a few days early because he is going in for a heart catheterization tomorrow, and he will not be able to drive for a week or so.    Observations/Objective: Patient sounds cheerful and well on the phone. I do not appreciate any SOB. Speech and thought processing are grossly intact. Patient reported vitals:  Assessment and Plan: Pain management. Indication for chronic opioid: low back pain Medication and dose: Oxycodone 30 mg # pills per month: 120 Last UDS date: 08-29-21 Opioid Treatment Agreement signed (Y/N): 08-26-17 Opioid Treatment Agreement last reviewed with patient:  02-27-22 NCCSRS reviewed this encounter (include red flags): Yes Meds were refilled.[name], MD   Follow Up Instructions:     [phone] 5-10 [phone] 11-20 [phone] 21-30 I did not refer this patient for an OV in the next 24 hours for this/these issue(s).  I discussed the assessment and treatment plan with the patient. The patient was provided an opportunity to ask questions and all were  answered. The patient agreed with the plan and demonstrated an understanding of the instructions.   The patient was advised to call back or seek an in-person evaluation if the symptoms worsen or if the condition fails to improve as anticipated.  I provided 13 minutes of non-face-to-face time during this encounter.   [name], MD     Review of Systems     Objective:   Physical Exam        Assessment & Plan:

## 2022-02-28 ENCOUNTER — Other Ambulatory Visit: Payer: Self-pay

## 2022-02-28 ENCOUNTER — Ambulatory Visit (HOSPITAL_COMMUNITY)
Admission: RE | Admit: 2022-02-28 | Discharge: 2022-03-01 | Disposition: A | Discharge status: Home / Self Care | Payer: Medicare HMO | Attending: Cardiology | Admitting: Cardiology

## 2022-02-28 ENCOUNTER — Encounter (HOSPITAL_COMMUNITY)
Admission: RE | Disposition: A | Discharge status: Home / Self Care | Payer: Self-pay | Source: Home / Self Care | Attending: Cardiology

## 2022-02-28 DIAGNOSIS — I25118 Atherosclerotic heart disease of native coronary artery with other forms of angina pectoris: Secondary | ICD-10-CM | POA: Diagnosis not present

## 2022-02-28 DIAGNOSIS — Z950 Presence of cardiac pacemaker: Secondary | ICD-10-CM | POA: Diagnosis not present

## 2022-02-28 DIAGNOSIS — I2582 Chronic total occlusion of coronary artery: Secondary | ICD-10-CM | POA: Diagnosis not present

## 2022-02-28 DIAGNOSIS — I5032 Chronic diastolic (congestive) heart failure: Secondary | ICD-10-CM | POA: Insufficient documentation

## 2022-02-28 DIAGNOSIS — E785 Hyperlipidemia, unspecified: Secondary | ICD-10-CM | POA: Diagnosis not present

## 2022-02-28 DIAGNOSIS — Z87891 Personal history of nicotine dependence: Secondary | ICD-10-CM | POA: Insufficient documentation

## 2022-02-28 DIAGNOSIS — I10 Essential (primary) hypertension: Secondary | ICD-10-CM | POA: Diagnosis present

## 2022-02-28 DIAGNOSIS — I110 Hypertensive heart disease with heart failure: Secondary | ICD-10-CM | POA: Insufficient documentation

## 2022-02-28 DIAGNOSIS — I4892 Unspecified atrial flutter: Secondary | ICD-10-CM

## 2022-02-28 DIAGNOSIS — Z955 Presence of coronary angioplasty implant and graft: Secondary | ICD-10-CM

## 2022-02-28 DIAGNOSIS — I2510 Atherosclerotic heart disease of native coronary artery without angina pectoris: Secondary | ICD-10-CM | POA: Diagnosis present

## 2022-02-28 HISTORY — PX: INTRAVASCULAR ULTRASOUND/IVUS: CATH118244

## 2022-02-28 HISTORY — PX: CORONARY ANGIOGRAPHY: CATH118303

## 2022-02-28 HISTORY — PX: CORONARY CTO INTERVENTION: CATH118236

## 2022-02-28 LAB — POCT ACTIVATED CLOTTING TIME
Activated Clotting Time: 245 seconds
Activated Clotting Time: 185 seconds

## 2022-02-28 LAB — GLUCOSE, CAPILLARY
Glucose-Capillary: 121 mg/dL — ABNORMAL HIGH (ref 70–99)
Glucose-Capillary: 120 mg/dL — ABNORMAL HIGH (ref 70–99)

## 2022-02-28 SURGERY — CORONARY CTO INTERVENTION
Anesthesia: LOCAL

## 2022-02-28 MED ORDER — ROPINIROLE HCL 0.5 MG PO TABS
0.5000 mg | ORAL_TABLET | Freq: Every day | ORAL | Status: DC
Start: 2022-02-28 — End: 2022-02-28

## 2022-02-28 MED ORDER — GABAPENTIN 100 MG PO CAPS
100.0000 mg | ORAL_CAPSULE | Freq: Every day | ORAL | Status: DC
  Administered 2022-02-28 – 2022-03-01 (×2): 100 mg via ORAL
  Filled 2022-02-28 (×2): qty 1

## 2022-02-28 MED ORDER — HEPARIN SODIUM (PORCINE) 1000 UNIT/ML IJ SOLN
INTRAMUSCULAR | Status: DC | PRN
  Administered 2022-02-28: 10000 [IU] via INTRAVENOUS
  Administered 2022-02-28: 2000 [IU] via INTRAVENOUS

## 2022-02-28 MED ORDER — SODIUM CHLORIDE 0.9 % IV SOLN: 250.0000 mL | INTRAVENOUS | Status: DC | PRN

## 2022-02-28 MED ORDER — OXYCODONE-ACETAMINOPHEN 5-325 MG PO TABS
ORAL_TABLET | ORAL | Status: AC
  Filled 2022-02-28: qty 1

## 2022-02-28 MED ORDER — NITROGLYCERIN 1 MG/10 ML FOR IR/CATH LAB
INTRA_ARTERIAL | Status: AC
  Filled 2022-02-28: qty 10

## 2022-02-28 MED ORDER — GABAPENTIN 100 MG PO CAPS
200.0000 mg | ORAL_CAPSULE | Freq: Every day | ORAL | Status: DC
  Administered 2022-02-28: 200 mg via ORAL
  Filled 2022-02-28: qty 2

## 2022-02-28 MED ORDER — HEPARIN (PORCINE) IN NACL 1000-0.9 UT/500ML-% IV SOLN
INTRAVENOUS | Status: DC | PRN
  Administered 2022-02-28 (×4): 500 mL

## 2022-02-28 MED ORDER — CARBOXYMETHYLCELLULOSE SOD PF 0.5 % OP SOLN: 1.0000 [drp] | Freq: Four times a day (QID) | OPHTHALMIC | Status: DC | PRN

## 2022-02-28 MED ORDER — LIDOCAINE HCL (PF) 1 % IJ SOLN
INTRAMUSCULAR | Status: AC
Start: 2022-02-28 — End: ?
  Filled 2022-02-28: qty 30

## 2022-02-28 MED ORDER — SODIUM CHLORIDE 0.9 % WEIGHT BASED INFUSION: 1.0000 mL/kg/h | INTRAVENOUS | Status: DC

## 2022-02-28 MED ORDER — VALACYCLOVIR HCL 500 MG PO TABS
500.0000 mg | ORAL_TABLET | Freq: Every morning | ORAL | Status: DC
  Administered 2022-03-01: 500 mg via ORAL
  Filled 2022-02-28: qty 1

## 2022-02-28 MED ORDER — MIDAZOLAM HCL 2 MG/2ML IJ SOLN
INTRAMUSCULAR | Status: AC
  Filled 2022-02-28: qty 2

## 2022-02-28 MED ORDER — HYDRALAZINE HCL 20 MG/ML IJ SOLN: 10.0000 mg | INTRAMUSCULAR | Status: AC | PRN

## 2022-02-28 MED ORDER — LIDOCAINE HCL (PF) 1 % IJ SOLN
INTRAMUSCULAR | Status: DC | PRN
  Administered 2022-02-28: 25 mL

## 2022-02-28 MED ORDER — OXYCODONE-ACETAMINOPHEN 5-325 MG PO TABS
1.0000 | ORAL_TABLET | ORAL | Status: DC | PRN
  Administered 2022-02-28 (×2): 2 via ORAL
  Administered 2022-02-28: 1 via ORAL
  Filled 2022-02-28 (×2): qty 2

## 2022-02-28 MED ORDER — SODIUM CHLORIDE 0.9% FLUSH
3.0000 mL | Freq: Two times a day (BID) | INTRAVENOUS | Status: DC
  Administered 2022-02-28 (×2): 3 mL via INTRAVENOUS

## 2022-02-28 MED ORDER — SODIUM CHLORIDE 0.9% FLUSH
3.0000 mL | Freq: Two times a day (BID) | INTRAVENOUS | Status: DC
Start: 2022-02-28 — End: 2022-03-01
  Administered 2022-02-28 – 2022-03-01 (×3): 3 mL via INTRAVENOUS

## 2022-02-28 MED ORDER — FLUTICASONE PROPIONATE 50 MCG/ACT NA SUSP: 1.0000 | Freq: Every day | NASAL | Status: DC | PRN

## 2022-02-28 MED ORDER — MIDAZOLAM HCL 2 MG/2ML IJ SOLN
INTRAMUSCULAR | Status: DC | PRN
  Administered 2022-02-28: 2 mg via INTRAVENOUS
  Administered 2022-02-28: 1 mg via INTRAVENOUS

## 2022-02-28 MED ORDER — POLYVINYL ALCOHOL 1.4 % OP SOLN
1.0000 [drp] | Freq: Four times a day (QID) | OPHTHALMIC | Status: DC | PRN
  Filled 2022-02-28: qty 15

## 2022-02-28 MED ORDER — HEPARIN (PORCINE) IN NACL 1000-0.9 UT/500ML-% IV SOLN
INTRAVENOUS | Status: AC
  Filled 2022-02-28: qty 500

## 2022-02-28 MED ORDER — NITROGLYCERIN 0.4 MG SL SUBL: 0.4000 mg | SUBLINGUAL_TABLET | SUBLINGUAL | Status: DC | PRN

## 2022-02-28 MED ORDER — FENTANYL CITRATE (PF) 100 MCG/2ML IJ SOLN
INTRAMUSCULAR | Status: AC
  Filled 2022-02-28: qty 2

## 2022-02-28 MED ORDER — SODIUM CHLORIDE 0.9% FLUSH: 3.0000 mL | INTRAVENOUS | Status: DC | PRN

## 2022-02-28 MED ORDER — ONDANSETRON HCL 4 MG/2ML IJ SOLN: 4.0000 mg | Freq: Four times a day (QID) | INTRAMUSCULAR | Status: DC | PRN

## 2022-02-28 MED ORDER — LABETALOL HCL 5 MG/ML IV SOLN: 10.0000 mg | INTRAVENOUS | Status: AC | PRN

## 2022-02-28 MED ORDER — ROPINIROLE HCL 0.5 MG PO TABS
5.0000 mg | ORAL_TABLET | Freq: Every day | ORAL | Status: DC
  Administered 2022-02-28 – 2022-03-01 (×5): 5 mg via ORAL
  Filled 2022-02-28 (×2): qty 10
  Filled 2022-02-28: qty 5
  Filled 2022-02-28 (×2): qty 10

## 2022-02-28 MED ORDER — ACETAMINOPHEN 325 MG PO TABS
650.0000 mg | ORAL_TABLET | ORAL | Status: DC | PRN
  Administered 2022-03-01: 650 mg via ORAL
  Filled 2022-02-28: qty 2

## 2022-02-28 MED ORDER — ACETAMINOPHEN 500 MG PO TABS
1000.0000 mg | ORAL_TABLET | Freq: Four times a day (QID) | ORAL | Status: DC | PRN
Start: 2022-02-28 — End: 2022-03-01

## 2022-02-28 MED ORDER — MAGNESIUM OXIDE 400 MG PO TABS: 400.0000 mg | ORAL_TABLET | Freq: Every day | ORAL | Status: DC | PRN

## 2022-02-28 MED ORDER — ASPIRIN 81 MG PO CHEW: 81.0000 mg | CHEWABLE_TABLET | ORAL | Status: DC

## 2022-02-28 MED ORDER — CYANOCOBALAMIN 500 MCG PO TABS
500.0000 ug | ORAL_TABLET | Freq: Every day | ORAL | Status: DC
  Administered 2022-02-28 – 2022-03-01 (×2): 500 ug via ORAL
  Filled 2022-02-28 (×2): qty 1

## 2022-02-28 MED ORDER — POTASSIUM CHLORIDE ER 10 MEQ PO TBCR
20.0000 meq | EXTENDED_RELEASE_TABLET | Freq: Every day | ORAL | Status: DC
  Administered 2022-03-01: 20 meq via ORAL
  Filled 2022-02-28 (×2): qty 2

## 2022-02-28 MED ORDER — CLOPIDOGREL BISULFATE 75 MG PO TABS: 75.0000 mg | ORAL_TABLET | Freq: Every day | ORAL | Status: DC

## 2022-02-28 MED ORDER — HEPARIN SODIUM (PORCINE) 1000 UNIT/ML IJ SOLN
INTRAMUSCULAR | Status: AC
  Filled 2022-02-28: qty 10

## 2022-02-28 MED ORDER — HEPARIN SODIUM (PORCINE) 1000 UNIT/ML IJ SOLN
INTRAMUSCULAR | Status: AC
Start: 2022-02-28 — End: ?
  Filled 2022-02-28: qty 10

## 2022-02-28 MED ORDER — PANTOPRAZOLE SODIUM 40 MG PO TBEC
40.0000 mg | DELAYED_RELEASE_TABLET | Freq: Every day | ORAL | Status: DC
  Administered 2022-02-28 – 2022-03-01 (×2): 40 mg via ORAL
  Filled 2022-02-28 (×2): qty 1

## 2022-02-28 MED ORDER — DILTIAZEM HCL ER COATED BEADS 120 MG PO CP24
120.0000 mg | ORAL_CAPSULE | Freq: Every day | ORAL | Status: DC
Start: 2022-02-28 — End: 2022-03-01
  Administered 2022-02-28 – 2022-03-01 (×2): 120 mg via ORAL
  Filled 2022-02-28 (×2): qty 1

## 2022-02-28 MED ORDER — SODIUM CHLORIDE 0.9 % IV SOLN: INTRAVENOUS | Status: AC

## 2022-02-28 MED ORDER — CLOPIDOGREL BISULFATE 75 MG PO TABS
75.0000 mg | ORAL_TABLET | Freq: Every day | ORAL | Status: DC
  Administered 2022-03-01: 75 mg via ORAL
  Filled 2022-02-28: qty 1

## 2022-02-28 MED ORDER — LEVOTHYROXINE SODIUM 75 MCG PO TABS
175.0000 ug | ORAL_TABLET | Freq: Every day | ORAL | Status: DC
  Administered 2022-03-01: 175 ug via ORAL
  Filled 2022-02-28: qty 1

## 2022-02-28 MED ORDER — NITROGLYCERIN 1 MG/10 ML FOR IR/CATH LAB
INTRA_ARTERIAL | Status: DC | PRN
  Administered 2022-02-28: 200 ug via INTRACORONARY

## 2022-02-28 MED ORDER — IOHEXOL 350 MG/ML SOLN
INTRAVENOUS | Status: DC | PRN
  Administered 2022-02-28: 90 mL

## 2022-02-28 MED ORDER — FENTANYL CITRATE (PF) 100 MCG/2ML IJ SOLN
INTRAMUSCULAR | Status: DC | PRN
  Administered 2022-02-28 (×2): 25 ug via INTRAVENOUS

## 2022-02-28 MED ORDER — ASPIRIN 81 MG PO CHEW
81.0000 mg | CHEWABLE_TABLET | Freq: Every day | ORAL | Status: DC
  Administered 2022-03-01: 81 mg via ORAL
  Filled 2022-02-28: qty 1

## 2022-02-28 MED ORDER — SODIUM CHLORIDE 0.9 % WEIGHT BASED INFUSION
3.0000 mL/kg/h | INTRAVENOUS | Status: AC
  Administered 2022-02-28: 3 mL/kg/h via INTRAVENOUS

## 2022-02-28 MED ORDER — CLOPIDOGREL BISULFATE 75 MG PO TABS
75.0000 mg | ORAL_TABLET | Freq: Once | ORAL | Status: AC
  Administered 2022-02-28: 75 mg via ORAL
  Filled 2022-02-28: qty 1

## 2022-02-28 MED ORDER — HEPARIN (PORCINE) IN NACL 1000-0.9 UT/500ML-% IV SOLN
INTRAVENOUS | Status: AC
  Filled 2022-02-28: qty 1000

## 2022-02-28 MED ORDER — ISOSORBIDE MONONITRATE ER 60 MG PO TB24
90.0000 mg | ORAL_TABLET | Freq: Every day | ORAL | Status: DC
  Administered 2022-02-28 – 2022-03-01 (×2): 90 mg via ORAL
  Filled 2022-02-28 (×2): qty 1

## 2022-02-28 SURGICAL SUPPLY — 24 items
BALLN SAPPHIRE 2.0X20 (BALLOONS) ×2
BALLN ~~LOC~~ EMERGE MR 3.5X20 (BALLOONS) ×2
BALLOON SAPPHIRE 2.0X20 (BALLOONS) IMPLANT
BALLOON ~~LOC~~ EMERGE MR 3.5X20 (BALLOONS) IMPLANT
CATH INFINITI 5FR JL4 (CATHETERS) ×1 IMPLANT
CATH MACH1 8F AL1 90CM (CATHETERS) ×1 IMPLANT
CATH MAMBA 135 (CATHETERS) ×1 IMPLANT
CATH OPTICROSS HD (CATHETERS) ×1 IMPLANT
CATH TRAPPER 6-8F (CATHETERS) ×1 IMPLANT
KIT ENCORE 26 ADVANTAGE (KITS) ×1 IMPLANT
KIT HEART LEFT (KITS) ×4 IMPLANT
KIT HEMO VALVE WATCHDOG (MISCELLANEOUS) ×1 IMPLANT
KIT MICROPUNCTURE NIT STIFF (SHEATH) ×2 IMPLANT
PACK CARDIAC CATHETERIZATION (CUSTOM PROCEDURE TRAY) ×3 IMPLANT
SHEATH BRITE TIP 8FR 35CM (SHEATH) ×1 IMPLANT
SHEATH PINNACLE 5F 10CM (SHEATH) ×1 IMPLANT
SLED PULL BACK IVUS (MISCELLANEOUS) ×1 IMPLANT
STENT SYNERGY XD 3.0X38 (Permanent Stent) IMPLANT
SYNERGY XD 3.0X38 (Permanent Stent) ×2 IMPLANT
TRANSDUCER W/STOPCOCK (MISCELLANEOUS) ×4 IMPLANT
TUBING CIL FLEX 10 FLL-RA (TUBING) ×4 IMPLANT
WIRE ASAHI PROWATER 180CM (WIRE) ×3 IMPLANT
WIRE EMERALD 3MM-J .035X150CM (WIRE) ×1 IMPLANT
WIRE FIGHTER CROSSING 190CM (WIRE) ×1 IMPLANT

## 2022-02-28 NOTE — Progress Notes (Signed)
Pt received from cath lab AxOx4, VS wnL and as per flow. Pt oriented to 6E processes. NSR on telemetry. B/L groins C/D/I, level 0, dressing in place. Pt familiar with the [facility] system. All questions and concerns addressed. Call bell placed within reach, will continue to monitor and maintain safety.

## 2022-02-28 NOTE — Interval H&P Note (Signed)
History and Physical Interval Note:  02/28/2022 8:06 AM  [name]  has presented today for surgery, with the diagnosis of cto.  The various methods of treatment have been discussed with the patient and family. After consideration of risks, benefits and other options for treatment, the patient has consented to  Procedure(s): CORONARY CTO INTERVENTION (N/A) as a surgical intervention.  The patient's history has been reviewed, patient examined, no change in status, stable for surgery.  I have reviewed the patient's chart and labs.  Questions were answered to the patient's satisfaction.   Cath Lab Visit (complete for each Cath Lab visit)  Clinical Evaluation Leading to the Procedure:   ACS: No.  Non-ACS:    Anginal Classification: CCS III  Anti-ischemic medical therapy: Maximal Therapy (2 or more classes of medications)  Non-Invasive Test Results: No non-invasive testing performed  Prior CABG: No previous CABG        Collier [facility] 02/28/2022 8:06 AM

## 2022-02-28 NOTE — Progress Notes (Signed)
SITE AREA: left groin/femoral  SITE PRIOR TO REMOVAL:  LEVEL 0  PRESSURE APPLIED FOR: approximately 20 minutes  MANUAL: yes  PATIENT STATUS DURING PULL: stable   POST PULL SITE:  LEVEL 0  POST PULL INSTRUCTIONS GIVEN: yes  POST PULL PULSES PRESENT: bilateral pedal pulses at +2, palpable  DRESSING APPLIED: gauze with tegaderm  BEDREST BEGINS @ 1406  COMMENTS:

## 2022-02-28 NOTE — Progress Notes (Addendum)
SITE AREA: right groin/femoral  SITE PRIOR TO REMOVAL:  LEVEL 0  PRESSURE APPLIED FOR: approximately 25 minutes  MANUAL: yes  PATIENT STATUS DURING PULL: stable  POST PULL SITE:  LEVEL 0  POST PULL INSTRUCTIONS GIVEN: yes  POST PULL PULSES PRESENT: bilateral pedal pulses at +2 palpable  DRESSING APPLIED: gauze with tegaderm  BEDREST BEGINS @ after left groin sheath removal  COMMENTS: minimal bruising distal to insertion site, slight puffiness noted to right groin area, but remains soft

## 2022-03-01 ENCOUNTER — Encounter (HOSPITAL_COMMUNITY): Payer: Self-pay | Admitting: Cardiology

## 2022-03-01 DIAGNOSIS — E785 Hyperlipidemia, unspecified: Secondary | ICD-10-CM | POA: Diagnosis not present

## 2022-03-01 DIAGNOSIS — Z87891 Personal history of nicotine dependence: Secondary | ICD-10-CM | POA: Diagnosis not present

## 2022-03-01 DIAGNOSIS — I25118 Atherosclerotic heart disease of native coronary artery with other forms of angina pectoris: Secondary | ICD-10-CM | POA: Diagnosis not present

## 2022-03-01 DIAGNOSIS — I110 Hypertensive heart disease with heart failure: Secondary | ICD-10-CM | POA: Diagnosis not present

## 2022-03-01 DIAGNOSIS — Z950 Presence of cardiac pacemaker: Secondary | ICD-10-CM | POA: Diagnosis not present

## 2022-03-01 DIAGNOSIS — I5032 Chronic diastolic (congestive) heart failure: Secondary | ICD-10-CM | POA: Diagnosis not present

## 2022-03-01 DIAGNOSIS — I2582 Chronic total occlusion of coronary artery: Secondary | ICD-10-CM | POA: Diagnosis not present

## 2022-03-01 LAB — BASIC METABOLIC PANEL
Anion gap: 6 (ref 5–15)
BUN: 16 mg/dL (ref 8–23)
CO2: 27 mmol/L (ref 22–32)
Calcium: 8.4 mg/dL — ABNORMAL LOW (ref 8.9–10.3)
Chloride: 105 mmol/L (ref 98–111)
Creatinine, Ser: 1.11 mg/dL (ref 0.61–1.24)
GFR, Estimated: >60 mL/min (ref >60)
Glucose, Bld: 104 mg/dL — ABNORMAL HIGH (ref 70–99)
Potassium: 3.8 mmol/L (ref 3.5–5.1)
Sodium: 138 mmol/L (ref 135–145)

## 2022-03-01 LAB — CBC
HCT: 38.6 % — ABNORMAL LOW (ref 39.0–52.0)
Hemoglobin: 12.3 g/dL — ABNORMAL LOW (ref 13.0–17.0)
MCH: 29.5 pg (ref 26.0–34.0)
MCHC: 31.9 g/dL (ref 30.0–36.0)
MCV: 92.6 fL (ref 80.0–100.0)
Platelets: 239 10*3/uL (ref 150–400)
RBC: 4.17 MIL/uL — ABNORMAL LOW (ref 4.22–5.81)
RDW: 15.2 % (ref 11.5–15.5)
WBC: 9.9 10*3/uL (ref 4.0–10.5)
nRBC: 0 % (ref 0.0–0.2)

## 2022-03-01 MED ORDER — DILTIAZEM HCL ER COATED BEADS 120 MG PO CP24: ORAL_CAPSULE | ORAL | 1 refills | Status: AC

## 2022-03-01 MED ORDER — ISOSORBIDE MONONITRATE ER 60 MG PO TB24: 90.0000 mg | ORAL_TABLET | Freq: Every day | ORAL | 1 refills | Status: AC

## 2022-03-01 MED ORDER — ASPIRIN 81 MG PO CHEW: 81.0000 mg | CHEWABLE_TABLET | Freq: Every day | ORAL | 0 refills | Status: DC

## 2022-03-01 MED ORDER — NITROGLYCERIN 0.4 MG SL SUBL: 0.4000 mg | SUBLINGUAL_TABLET | SUBLINGUAL | 1 refills | Status: AC | PRN

## 2022-03-01 MED ORDER — APIXABAN 5 MG PO TABS: 5.0000 mg | ORAL_TABLET | Freq: Two times a day (BID) | ORAL | 2 refills | Status: DC

## 2022-03-01 MED ORDER — PANTOPRAZOLE SODIUM 40 MG PO TBEC: 40.0000 mg | DELAYED_RELEASE_TABLET | Freq: Every day | ORAL | 2 refills | Status: DC

## 2022-03-01 NOTE — Plan of Care (Signed)

## 2022-03-01 NOTE — Progress Notes (Signed)
CARDIAC REHAB PHASE I   PRE:  Rate/Rhythm: 84 Paced   BP:  Sitting: 118/67      SaO2: 96  MODE:  Ambulation: 50 ft   POST:  Rate/Rhythm: 86 Paced  BP:  Sitting: 117/63      SaO2: 96 RA   Pt ambulated using front wheel walker. Complaint of feeling lightheaded and bad headache. No sob or CP. Back to room to bed with call bell and bedside table in reach. Pt states that his headache and lightheadedness are same as he has at home. Encouraged pt to ask for help with ambulation here in hospital and at home until this improves. Pt girlfriend will be with him at home. Post stent education including site care, antiplatelet therapy importance, restrictions, heart healthy diet, exercise guidelines, and CRP2. Will refer to [facility] for CRP2. Pt plan for home today. All questions and concerns addressed. Reino Bellis NP aware of pt headache and lightheadedness. Floor RN bringing tylenol for headache.   0920-1020  Vanessa Barbara, RN BSN 03/01/2022 10:13 AM

## 2022-03-01 NOTE — Discharge Summary (Addendum)
Discharge Summary    Patient ID: [name] MRN: [id number]; DOB: 10-Jun-1941  Admit date: 02/28/2022 Discharge date: 03/01/2022  PCP:  [name], MD   [facility] Providers Cardiologist:  [name], MD  Electrophysiologist:  Virl Axe, MD    Discharge Diagnoses    Principal Problem:   CAD (coronary artery disease) Active Problems:   Essential hypertension   Hyperlipidemia LDL goal <70  Diagnostic Studies/Procedures    Cath: 02/28/22    Mid RCA lesion is 100% stenosed.   A drug-eluting stent was successfully placed using a SYNERGY XD 3.0X38.   Post intervention, there is a 0% residual stenosis.   Successful PCI of CTO of the RCA with IVUS guidance and DES x 1.    Plan: observe overnight on telemetry. Remove sheaths manually. Anticipate DC in am. ASA 81 mg for one month. Plavix for 12 months. May resume Eliquis tomorrow if no bleeding issues.   Diagnostic Dominance: Right  Intervention   _____________   History of Present Illness     [name] is a 81 y.o. male with past medical history of CAD with known CTO of RCA who was referred to Dr. Martinique as an outpatient to consider CTO PCI.  Prior stenting of his LAD and PDA in 2016.  He had noted exertional chest pain and shortness of breath over the past several months and symptoms with minimal activity.  He was sent for outpatient cardiac catheterization on 01/25/2022 showing severe disease of the apical LAD, 70% mid to distal LAD and patent small/mid LAD stent with known occlusion of mid RCA with left-to-right collaterals.  This remained to cross with a wire but unsuccessful.  He was deemed appropriate to take for CTO PCI.  This was set up as an outpatient.  Hospital Course     Underwent successful PCI of CTO of the RCA with IVUS guidance and DES x 1 with Dr. Martinique.  Plan for aspirin, Plavix and Eliquis for 1 month.  Then stopping aspirin.  No recurrent chest pain overnight.  Continued on other  home medications without significant change.  Seen by cardiac rehab.  Morning labs stable, mild decline in hemoglobin to 12.3, suspect procedural blood loss.  He was switched from omeprazole to Protonix given the need for Plavix.  Able to ambulate without complications.   General: Well developed, well nourished, male appearing in no acute distress. Head: Normocephalic, atraumatic.  Neck: Supple without bruits, JVD. Lungs:  Resp regular and unlabored, CTA. Heart: RRR, S1, S2, no S3, S4, or murmur; no rub. Abdomen: Soft, non-tender, non-distended with normoactive bowel sounds. No hepatomegaly. No rebound/guarding. No obvious abdominal masses. Extremities: No clubbing, cyanosis, edema. Distal pedal pulses are 2+ bilaterally.  Bilateral femoral cath site stable with mild bruising, no hematoma Neuro: Alert and oriented X 3. Moves all extremities spontaneously. Psych: Normal affect.  Was seen by Dr. Gwenlyn Found and deemed stable for discharge home.  Follow-up in the office arranged.  Medication sent to patient's pharmacy of choice.  Educated by [employer].D. prior to discharge.  Did the patient have an acute coronary syndrome (MI, NSTEMI, STEMI, etc) this admission?:  No                               Did the patient have a percutaneous coronary intervention (stent / angioplasty)?:  Yes.     Cath/PCI Registry Performance & Quality Measures: Aspirin prescribed? - Yes ADP  Receptor Inhibitor (Plavix/Clopidogrel, Brilinta/Ticagrelor or Effient/Prasugrel) prescribed (includes medically managed patients)? - Yes High Intensity Statin (Lipitor 40-'80mg'$  or Crestor 20-'40mg'$ ) prescribed? - No - intolerant, has decline referral to lipid clinic For EF <40%, was ACEI/ARB prescribed? - Not Applicable (EF >/= 10%) For EF <40%, Aldosterone Antagonist (Spironolactone or Eplerenone) prescribed? - Not Applicable (EF >/= 62%) Cardiac Rehab Phase II ordered? - Yes       The patient will be scheduled for a TOC follow up  appointment in 10-14 days.  A message has been sent to the [facility] and Scheduling Pool at the office where the patient should be seen for follow up.  _____________  Discharge Vitals Blood pressure 117/63, pulse 68, temperature 97.9 F (36.6 C), temperature source Oral, resp. rate 18, height '5\' 9"'$  (1.753 m), weight 98.4 kg, SpO2 92 %.  Filed Weights   02/28/22 0637  Weight: 98.4 kg    Labs & Radiologic Studies    CBC Recent Labs    03/01/22 0351  WBC 9.9  HGB 12.3*  HCT 38.6*  MCV 92.6  PLT 694   Basic Metabolic Panel Recent Labs    03/01/22 0351  NA 138  K 3.8  CL 105  CO2 27  GLUCOSE 104*  BUN 16  CREATININE 1.11  CALCIUM 8.4*   Liver Function Tests No results for input(s): "AST", "ALT", "ALKPHOS", "BILITOT", "PROT", "ALBUMIN" in the last 72 hours. No results for input(s): "LIPASE", "AMYLASE" in the last 72 hours. High Sensitivity Troponin:   No results for input(s): "TROPONINIHS" in the last 720 hours.  BNP Invalid input(s): "POCBNP" D-Dimer No results for input(s): "DDIMER" in the last 72 hours. Hemoglobin A1C No results for input(s): "HGBA1C" in the last 72 hours. Fasting Lipid Panel No results for input(s): "CHOL", "HDL", "LDLCALC", "TRIG", "CHOLHDL", "LDLDIRECT" in the last 72 hours. Thyroid Function Tests No results for input(s): "TSH", "T4TOTAL", "T3FREE", "THYROIDAB" in the last 72 hours.  Invalid input(s): "FREET3" _____________  CARDIAC CATHETERIZATION  Result Date: 02/28/2022   Mid RCA lesion is 100% stenosed.   A drug-eluting stent was successfully placed using a SYNERGY XD 3.0X38.   Post intervention, there is a 0% residual stenosis. Successful PCI of CTO of the RCA with IVUS guidance and DES x 1. Plan: observe overnight on telemetry. Remove sheaths manually. Anticipate DC in am. ASA 81 mg for one month. Plavix for 21 months. May resume Eliquis tomorrow if no bleeding issues.    Disposition   Pt is being discharged home today in good  condition.  Follow-up Plans & Appointments     Follow-up Information     Martinique, Peter M, MD Follow up on 03/16/2022.   Specialty: Cardiology Why: at 1:30pm for your follow up appt Contact information: [address] [place] [address] [phone]                Discharge Instructions     Amb Referral to Cardiac Rehabilitation   Complete by: As directed    Diagnosis: Coronary Stents   After initial evaluation and assessments completed: Virtual Based Care may be provided alone or in conjunction with Phase 2 Cardiac Rehab based on patient barriers.: Yes   Call MD for:  difficulty breathing, headache or visual disturbances   Complete by: As directed    Call MD for:  persistant dizziness or light-headedness   Complete by: As directed    Call MD for:  redness, tenderness, or signs of infection (pain, swelling, redness, odor or  green/yellow discharge around incision site)   Complete by: As directed    Diet - low sodium heart healthy   Complete by: As directed    Discharge instructions   Complete by: As directed    Groin Site Care Refer to this sheet in the next few weeks. These instructions provide you with information on caring for yourself after your procedure. Your caregiver may also give you more specific instructions. Your treatment has been planned according to current medical practices, but problems sometimes occur. Call your caregiver if you have any problems or questions after your procedure. HOME CARE INSTRUCTIONS You may shower 24 hours after the procedure. Remove the bandage (dressing) and gently wash the site with plain soap and water. Gently pat the site dry.  Do not apply powder or lotion to the site.  Do not sit in a bathtub, swimming pool, or whirlpool for 5 to 7 days.  No bending, squatting, or lifting anything over 10 pounds (4.5 kg) as directed by your caregiver.  Inspect the site at least twice daily.  Do not drive home if you are  discharged the same day of the procedure. Have someone else drive you.  You may drive 24 hours after the procedure unless otherwise instructed by your caregiver.  What to expect: Any bruising will usually fade within 1 to 2 weeks.  Blood that collects in the tissue (hematoma) may be painful to the touch. It should usually decrease in size and tenderness within 1 to 2 weeks.  SEEK IMMEDIATE MEDICAL CARE IF: You have unusual pain at the groin site or down the affected leg.  You have redness, warmth, swelling, or pain at the groin site.  You have drainage (other than a small amount of blood on the dressing).  You have chills.  You have a fever or persistent symptoms for more than 72 hours.  You have a fever and your symptoms suddenly get worse.  Your leg becomes pale, cool, tingly, or numb.  You have heavy bleeding from the site. Hold pressure on the site. [name]  PLEASE DO NOT MISS ANY DOSES OF YOUR PLAVIX!!!!! Also keep a log of you blood pressures and bring back to your follow up appt. Please call the office with any questions.   Patients taking blood thinners should generally stay away from medicines like ibuprofen, Advil, Motrin, naproxen, and Aleve due to risk of stomach bleeding. You may take Tylenol as directed or talk to your primary doctor about alternatives.  Some studies suggest Prilosec/Omeprazole interacts with Plavix. We changed your Prilosec/Omeprazole to the equivalent dose of Protonix for less chance of interaction.  PLEASE ENSURE THAT YOU DO NOT RUN OUT OF YOUR PLAVIX. This medication is very important to remain on for at least one year. IF you have issues obtaining this medication due to cost please CALL the office 3-5 business days prior to running out in order to prevent missing doses of this medication.   Increase activity slowly   Complete by: As directed        Discharge Medications   Allergies as of 03/01/2022       Reactions   Brilinta [ticagrelor] Shortness Of  Breath   Colchicine    Syncope-causes patient to pass out   Metoprolol Tartrate Other (See Comments)   Severe chest pains " flat lined patient", chest pain, Tachyarrhythmia   Mirapex [pramipexole Dihydrochloride]    Severe leg pain   Shellfish Allergy Anaphylaxis, Hives   Statins Other (See Comments)  All statins cause myalgias   Zolpidem Other (See Comments)   Chest pain   Coconut (cocos Nucifera) Hives   Lasix [furosemide] Hives, Itching   Levaquin [levofloxacin] Hives   Trazodone And Nefazodone Other (See Comments)   Unsteady on feet        Medication List     STOP taking these medications    omeprazole 40 MG capsule Commonly known as: PRILOSEC Replaced by: pantoprazole 40 MG tablet   ranolazine 500 MG 12 hr tablet Commonly known as: RANEXA       TAKE these medications    acetaminophen 500 MG tablet Commonly known as: TYLENOL Take 1,000 mg by mouth every 6 (six) hours as needed for moderate pain.   apixaban 5 MG Tabs tablet Commonly known as: ELIQUIS Take 1 tablet (5 mg total) by mouth 2 (two) times daily.   aspirin 81 MG chewable tablet Chew 1 tablet (81 mg total) by mouth daily.   clopidogrel 75 MG tablet Commonly known as: PLAVIX Take 1 tablet (75 mg total) by mouth daily.   diclofenac sodium 1 % Gel Commonly known as: Voltaren Apply 1 application topically daily as needed (ankle pain).   diltiazem 120 MG 24 hr capsule Commonly known as: CARDIZEM CD TAKE 1 CAPSULE BY MOUTH EVERY DAY   fluticasone 50 MCG/ACT nasal spray Commonly known as: FLONASE PLACE 1 SPRAY INTO BOTH NOSTRILS DAILY AS NEEDED FOR ALLERGIES OR RHINITIS.   gabapentin 100 MG capsule Commonly known as: NEURONTIN TAKE 1 CAPSULE BY MOUTH THREE TIMES A DAY What changed:  how much to take how to take this when to take this additional instructions   isosorbide mononitrate 60 MG 24 hr tablet Commonly known as: IMDUR Take 1.5 tablets (90 mg total) by mouth daily.    ketoconazole 2 % cream Commonly known as: NIZORAL Apply 1 application topically 2 (two) times daily as needed for irritation.   levothyroxine 175 MCG tablet Commonly known as: Synthroid Take 1 tablet (175 mcg total) by mouth daily before breakfast.   Lubricant Eye Drops PF 0.5 % Soln Generic drug: Carboxymethylcellulose Sod PF Place 1 drop into both eyes 4 (four) times daily as needed (dry/irritated eyes.).   magnesium oxide 400 MG tablet Commonly known as: MAG-OX Take 400 mg by mouth daily as needed (restless leg).   moxifloxacin 0.5 % ophthalmic solution Commonly known as: VIGAMOX Place 1 drop into the right eye in the morning and at bedtime.   nitroGLYCERIN 0.4 MG SL tablet Commonly known as: Nitrostat Place 1 tablet (0.4 mg total) under the tongue every 5 (five) minutes as needed for chest pain.   oxycodone 30 MG immediate release tablet Commonly known as: ROXICODONE Take 1 tablet (30 mg total) by mouth every 6 (six) hours as needed for pain. Start taking on: April 29, 2022   pantoprazole 40 MG tablet Commonly known as: PROTONIX Take 1 tablet (40 mg total) by mouth daily. Replaces: omeprazole 40 MG capsule   potassium chloride 10 MEQ tablet Commonly known as: KLOR-CON Take 20 mEq by mouth daily with breakfast.   predniSONE 10 MG tablet Commonly known as: DELTASONE Take 5 tabs a day for 3 days, then 4 a day for 3 days, then 3 a day for 3 days, then 2 a day for 3 days, then 1 a day for 3 days, then stop What changed:  how much to take how to take this when to take this additional instructions   ropinirole 5 MG tablet Commonly known  as: REQUIP Take 1-2 tablets (5-10 mg total) by mouth See admin instructions. Take 5 mg in the morning and 10 mg at night, may take 5 mg up to 6 times a day as needed for restless legs What changed: additional instructions   torsemide 20 MG tablet Commonly known as: DEMADEX Take 0.5 tablets (10 mg total) by mouth See admin  instructions. Take 10 mg daily, may take a second 10 mg dose an hour later if he doesn't use the bathroom What changed:  how much to take when to take this additional instructions   valACYclovir 500 MG tablet Commonly known as: VALTREX Take 500 mg by mouth in the morning.   vitamin B-12 500 MCG tablet Commonly known as: CYANOCOBALAMIN Take 500 mcg by mouth daily.   VITAMIN C PO Take 1-4 tablets by mouth See admin instructions. Take 1 tablet daily, may increase to 3-4 tabs daily as needed for immune support        Outstanding Labs/Studies   N/a   Duration of Discharge Encounter   Greater than 30 minutes including physician time.  Signed, Reino Bellis, NP 03/01/2022, 11:46 AM    Agree with note by Reino Bellis NP-C  Pt admitted for CTO intervention by Drs Martinique and [place]. Had long CTO of mid dominant RCA. Successful PCI/DES. Pt feels clinically improved. Bilat groins with ecchymosis but w/o hematoma. On DAPT. OK fo rDC home. Will arrange OP F/U.  Lorretta Harp, M.D., [place], [facility], [place] [address] [place], [place]  [address]  [phone] 03/01/2022 2:05 PM

## 2022-03-02 ENCOUNTER — Telehealth: Payer: Self-pay | Admitting: Pharmacist

## 2022-03-02 LAB — LIPOPROTEIN A (LPA): Lipoprotein (a): 23.2 nmol/L (ref <75.0)

## 2022-03-02 NOTE — Chronic Care Management (AMB) (Cosign Needed Addendum)
Chronic Care Management Pharmacy Assistant   Name: ANUJ SUMMONS  MRN: [id number] DOB: 11-23-1940  Reason for Encounter: Medication Review / Medication Coordination Call  Recent office visits:  02/27/2022 [name] MD - Patient was seen for chronic narcotic use and an additional issues. Changed Oxycodone 30 mg every 6 hrs prn. No follow up noted.   02/09/2022 [name] MD - Patient was seen for acute gout of left knee and additional issues. Started a prednisone 10 mg taper. No follow up noted.   Recent consult visits:  02/09/2022 Richardson Dopp PA-C (cardiology) - Patient was seen for Coronary artery disease involving native coronary artery of native heart with angina pectoris and an additional issue. Increased Isosorbide to 90 mg daily. Follow-up with EP as planned.  Hospital visits:  Admitted [facility] on 02/28/2022 due to CAD. Discharge date was 03/01/2022.     New?Medications Started at [facility] Discharge:?? aspirin pantoprazole (PROTONIX)  Medication Changes at Hospital Discharge: No other medication changes  Medications Discontinued at Hospital Discharge: omeprazole 40 MG capsule (PRILOSEC) ranolazine 500 MG 12 hr tablet (RANEXA)  Medications that remain the same after Hospital Discharge:??   acetaminophen 500 Mg tablet Take 1,000 mg by mouth every 6 (six) hours as needed for moderate pain. apixaban 5 MG Tabs tablet Take 1 tablet (5 mg total) by mouth 2 (two) times daily.  clopidogrel 75 MG tablet Take 1 tablet (75 mg total) by mouth daily. diclofenac sodium 1 % Gel Apply 1 application topically daily as needed (ankle pain). diltiazem 120 MG 24 hr capsule TAKE 1 CAPSULE BY MOUTH EVERY DAY fluticasone 50 MCG/ACT  Follow up with Dr Martinique Peter MD (cardiology) on 03/16/2022  Medications: Outpatient Encounter Medications as of 03/02/2022  Medication Sig Note   acetaminophen (TYLENOL) 500 MG tablet Take 1,000 mg by mouth every 6 (six) hours as  needed for moderate pain.    apixaban (ELIQUIS) 5 MG TABS tablet Take 1 tablet (5 mg total) by mouth 2 (two) times daily.    Ascorbic Acid (VITAMIN C PO) Take 1-4 tablets by mouth See admin instructions. Take 1 tablet daily, may increase to 3-4 tabs daily as needed for immune support    aspirin 81 MG chewable tablet Chew 1 tablet (81 mg total) by mouth daily.    Carboxymethylcellulose Sod PF (LUBRICANT EYE DROPS PF) 0.5 % SOLN Place 1 drop into both eyes 4 (four) times daily as needed (dry/irritated eyes.).    clopidogrel (PLAVIX) 75 MG tablet Take 1 tablet (75 mg total) by mouth daily.    diclofenac sodium (VOLTAREN) 1 % GEL Apply 1 application topically daily as needed (ankle pain).    diltiazem (CARDIZEM CD) 120 MG 24 hr capsule TAKE 1 CAPSULE BY MOUTH EVERY DAY    fluticasone (FLONASE) 50 MCG/ACT nasal spray PLACE 1 SPRAY INTO BOTH NOSTRILS DAILY AS NEEDED FOR ALLERGIES OR RHINITIS.    gabapentin (NEURONTIN) 100 MG capsule TAKE 1 CAPSULE BY MOUTH THREE TIMES A DAY (Patient taking differently: Take 100-200 mg by mouth See admin instructions. Take 1 capsule (100 mg) by mouth in the morning & take 2 capsules (200 mg) by mouth at night.)    isosorbide mononitrate (IMDUR) 60 MG 24 hr tablet Take 1.5 tablets (90 mg total) by mouth daily.    ketoconazole (NIZORAL) 2 % cream Apply 1 application topically 2 (two) times daily as needed for irritation.    levothyroxine (SYNTHROID) 175 MCG tablet Take 1 tablet (175 mcg total)  by mouth daily before breakfast.    magnesium oxide (MAG-OX) 400 MG tablet Take 400 mg by mouth daily as needed (restless leg). 02/27/2022: (On hold)   moxifloxacin (VIGAMOX) 0.5 % ophthalmic solution Place 1 drop into the right eye in the morning and at bedtime. 02/27/2022: (Patient running out of medication)   nitroGLYCERIN (NITROSTAT) 0.4 MG SL tablet Place 1 tablet (0.4 mg total) under the tongue every 5 (five) minutes as needed for chest pain.    [START ON 04/29/2022] oxycodone  (ROXICODONE) 30 MG immediate release tablet Take 1 tablet (30 mg total) by mouth every 6 (six) hours as needed for pain.    pantoprazole (PROTONIX) 40 MG tablet Take 1 tablet (40 mg total) by mouth daily.    potassium chloride (KLOR-CON) 10 MEQ tablet Take 20 mEq by mouth daily with breakfast.    predniSONE (DELTASONE) 10 MG tablet Take 5 tabs a day for 3 days, then 4 a day for 3 days, then 3 a day for 3 days, then 2 a day for 3 days, then 1 a day for 3 days, then stop (Patient taking differently: Take 30 mg by mouth daily.)    ropinirole (REQUIP) 5 MG tablet Take 1-2 tablets (5-10 mg total) by mouth See admin instructions. Take 5 mg in the morning and 10 mg at night, may take 5 mg up to 6 times a day as needed for restless legs (Patient taking differently: Take 5-10 mg by mouth See admin instructions. Take  1 tablet (5 mg) by mouth in the morning and 2 tablets (10 mg) by mouth at night (scheduled), may take 5 mg up to 6 times a day as needed for restless legs)    torsemide (DEMADEX) 20 MG tablet Take 0.5 tablets (10 mg total) by mouth See admin instructions. Take 10 mg daily, may take a second 10 mg dose an hour later if he doesn't use the bathroom (Patient taking differently: Take 20 mg by mouth every Monday, Wednesday, and Friday.)    valACYclovir (VALTREX) 500 MG tablet Take 500 mg by mouth in the morning.    vitamin B-12 (CYANOCOBALAMIN) 500 MCG tablet Take 500 mcg by mouth daily.    No facility-administered encounter medications on file as of 03/02/2022.  Reviewed chart for medication changes ahead of medication coordination call.  No OVs, Consults, or hospital visits since last care coordination call/Pharmacist visit. (If appropriate, list visit date, provider name)  No medication changes indicated OR if recent visit, treatment plan here.  BP Readings from Last 3 Encounters:  03/01/22 117/63  02/23/22 126/75  02/09/22 136/68    Lab Results  Component Value Date   HGBA1C 6.3 11/21/2021      Patient obtains medications through Adherence Packaging  30 Days    Last adherence delivery included: Gabapentin 100 mg - 1 tablet at breakfast and 2 at bedtime Vitamin B12 500 mcg - 1 tablet at breakfast and bedtime Torsemide 20 mg - 1 tablet at breakfast on Mon, Wed and Fri Diltiazem 120 mg - 1 tablet at breakfast Apixaban 5 mg - take 1 tablet at breakfast and bedtime Isosorbide 60 mg - 1 tablet at breakfast Levothyroxine 200 mcg - 1 tablet before breakfast Klor-Con 10 meq - 2 tablets at breakfast Ropinirole 5 mg - 2 tablets at breakfast, lunch and at bedtime Nitroglycerin 0.4 mg - 1 tablet under tongue every 5 min as needed for chest pain   Notes: D/C Glipizide, start Wilder Glade will need to pick up samples  at office. Change Potassium to 2 of the '10mg'$  tablets as the liquid will cost >150. Choose appt time with Sarajane Jews to discuss RLS options.   Patient declined last month:       Patient is due for next adherence delivery on: 03/14/2022   Called patient and reviewed medications and coordinated delivery.   This delivery to include: No delivery this month, patient states he just had cardiac surgery, cardiologist has had him to stop all his medications for now. He is currently taking what the cardiologist has him on, filled at the hospital. He has a follow up visit with Dr. Peter Martinique on 03/16/2022. He will discuss with him at that time what medications to continue and what will change.    Patient will need a short fill: No short fill needed   Coordinated acute fill: No acute fill needed   Patient declined the following medications:  Levothyroxine 175 mcg - 1 tablet before breakfast (picked up at [facility] on 02/12/2022) Ranolazine ER 500 mg - discontinued at hospital Nitroglycerin 0.4 mg - pt requests don't send til he asks for it.  Gabapentin 100 mg - 1 tablet at breakfast and 2 at bedtime Vitamin B12 500 mcg - 1 tablet at breakfast and bedtime Torsemide 20 mg - 1 tablet at breakfast  on Mon, Wed and Fri Diltiazem 120 mg - 1 tablet at breakfast Apixaban 5 mg - take 1 tablet at breakfast and bedtime Isosorbide 60 mg - Take 1.5 tablets (90 mg total) by mouth daily., Starting Thu 03/01/2022 (on hold at [facility] - not filled yet) Klor-Con 10 meq - 2 tablets at breakfast Ropinirole 5 mg - 2 tablets at breakfast, lunch and at bedtime (vial)   Confirmed delivery date of 03/14/2022, advised patient that pharmacy will contact them the morning of delivery   Care Gaps: AWV - scheduled 08/31/2022  Last BP - 126/75 on 02/23/2022 Last A1C - 6.3 on 11/21/2021 Covid vaccine - never done Foot exam - never done Eye exam - never done Malb - never done Shingrix - never done Flu - due Pneumonia vaccine - postponed  Star Rating Drugs: None  [place] Pharmacist Assistant [phone]

## 2022-03-05 ENCOUNTER — Emergency Department (HOSPITAL_BASED_OUTPATIENT_CLINIC_OR_DEPARTMENT_OTHER): Payer: Medicare HMO

## 2022-03-05 ENCOUNTER — Emergency Department (HOSPITAL_BASED_OUTPATIENT_CLINIC_OR_DEPARTMENT_OTHER)
Admission: EM | Admit: 2022-03-05 | Discharge: 2022-03-05 | Disposition: A | Discharge status: Home / Self Care | Payer: Medicare HMO | Attending: Emergency Medicine | Admitting: Emergency Medicine

## 2022-03-05 ENCOUNTER — Other Ambulatory Visit: Payer: Self-pay

## 2022-03-05 ENCOUNTER — Encounter (HOSPITAL_BASED_OUTPATIENT_CLINIC_OR_DEPARTMENT_OTHER): Payer: Self-pay | Admitting: Emergency Medicine

## 2022-03-05 ENCOUNTER — Ambulatory Visit: Payer: Medicare HMO | Admitting: Internal Medicine

## 2022-03-05 ENCOUNTER — Encounter: Payer: Self-pay | Admitting: Internal Medicine

## 2022-03-05 ENCOUNTER — Telehealth: Payer: Self-pay | Admitting: Cardiology

## 2022-03-05 VITALS — BP 130/60 | HR 68 | Ht 69.0 in | Wt 222.0 lb

## 2022-03-05 DIAGNOSIS — I25118 Atherosclerotic heart disease of native coronary artery with other forms of angina pectoris: Secondary | ICD-10-CM | POA: Diagnosis not present

## 2022-03-05 DIAGNOSIS — Z7902 Long term (current) use of antithrombotics/antiplatelets: Secondary | ICD-10-CM | POA: Diagnosis not present

## 2022-03-05 DIAGNOSIS — R109 Unspecified abdominal pain: Secondary | ICD-10-CM | POA: Diagnosis not present

## 2022-03-05 DIAGNOSIS — Z79899 Other long term (current) drug therapy: Secondary | ICD-10-CM | POA: Diagnosis not present

## 2022-03-05 DIAGNOSIS — T148XXA Other injury of unspecified body region, initial encounter: Secondary | ICD-10-CM

## 2022-03-05 DIAGNOSIS — W19XXXA Unspecified fall, initial encounter: Secondary | ICD-10-CM | POA: Insufficient documentation

## 2022-03-05 DIAGNOSIS — I480 Paroxysmal atrial fibrillation: Secondary | ICD-10-CM

## 2022-03-05 DIAGNOSIS — S30201A Contusion of unspecified external genital organ, male, initial encounter: Secondary | ICD-10-CM | POA: Insufficient documentation

## 2022-03-05 DIAGNOSIS — S3993XA Unspecified injury of pelvis, initial encounter: Secondary | ICD-10-CM | POA: Diagnosis present

## 2022-03-05 DIAGNOSIS — Z9049 Acquired absence of other specified parts of digestive tract: Secondary | ICD-10-CM | POA: Diagnosis not present

## 2022-03-05 DIAGNOSIS — Z7982 Long term (current) use of aspirin: Secondary | ICD-10-CM | POA: Diagnosis not present

## 2022-03-05 DIAGNOSIS — W1839XA Other fall on same level, initial encounter: Secondary | ICD-10-CM | POA: Insufficient documentation

## 2022-03-05 DIAGNOSIS — S301XXA Contusion of abdominal wall, initial encounter: Secondary | ICD-10-CM | POA: Diagnosis not present

## 2022-03-05 LAB — CBC WITH DIFFERENTIAL/PLATELET
Abs Immature Granulocytes: 0.57 10*3/uL — ABNORMAL HIGH (ref 0.00–0.07)
Basophils Absolute: 0.1 10*3/uL (ref 0.0–0.1)
Basophils Relative: 1 %
Eosinophils Absolute: 0 10*3/uL (ref 0.0–0.5)
Eosinophils Relative: 0 %
HCT: 43 % (ref 39.0–52.0)
Hemoglobin: 13.9 g/dL (ref 13.0–17.0)
Immature Granulocytes: 4 %
Lymphocytes Relative: 12 %
Lymphs Abs: 1.6 10*3/uL (ref 0.7–4.0)
MCH: 30 pg (ref 26.0–34.0)
MCHC: 32.3 g/dL (ref 30.0–36.0)
MCV: 92.7 fL (ref 80.0–100.0)
Monocytes Absolute: 0.6 10*3/uL (ref 0.1–1.0)
Monocytes Relative: 5 %
Neutro Abs: 10 10*3/uL — ABNORMAL HIGH (ref 1.7–7.7)
Neutrophils Relative %: 78 %
Platelets: 316 10*3/uL (ref 150–400)
RBC: 4.64 MIL/uL (ref 4.22–5.81)
RDW: 15.9 % — ABNORMAL HIGH (ref 11.5–15.5)
WBC: 12.9 10*3/uL — ABNORMAL HIGH (ref 4.0–10.5)
nRBC: 0 % (ref 0.0–0.2)

## 2022-03-05 LAB — COMPREHENSIVE METABOLIC PANEL
ALT: 14 U/L (ref 0–44)
AST: 18 U/L (ref 15–41)
Albumin: 4.2 g/dL (ref 3.5–5.0)
Alkaline Phosphatase: 60 U/L (ref 38–126)
Anion gap: 14 (ref 5–15)
BUN: 22 mg/dL (ref 8–23)
CO2: 21 mmol/L — ABNORMAL LOW (ref 22–32)
Calcium: 9 mg/dL (ref 8.9–10.3)
Chloride: 101 mmol/L (ref 98–111)
Creatinine, Ser: 1.11 mg/dL (ref 0.61–1.24)
GFR, Estimated: >60 mL/min (ref >60)
Glucose, Bld: 212 mg/dL — ABNORMAL HIGH (ref 70–99)
Potassium: 4.4 mmol/L (ref 3.5–5.1)
Sodium: 136 mmol/L (ref 135–145)
Total Bilirubin: 1.1 mg/dL (ref 0.3–1.2)
Total Protein: 6.7 g/dL (ref 6.5–8.1)

## 2022-03-05 LAB — URINALYSIS, ROUTINE W REFLEX MICROSCOPIC
Bilirubin Urine: NEGATIVE
Glucose, UA: NEGATIVE mg/dL
Hgb urine dipstick: NEGATIVE
Ketones, ur: NEGATIVE mg/dL
Leukocytes,Ua: NEGATIVE
Nitrite: NEGATIVE
Protein, ur: NEGATIVE mg/dL
Specific Gravity, Urine: 1.028 (ref 1.005–1.030)
pH: 5 (ref 5.0–8.0)

## 2022-03-05 LAB — POCT ACTIVATED CLOTTING TIME
Activated Clotting Time: 245 seconds
Activated Clotting Time: 341 seconds

## 2022-03-05 MED ORDER — IOHEXOL 350 MG/ML SOLN
100.0000 mL | Freq: Once | INTRAVENOUS | Status: AC | PRN
  Administered 2022-03-05: 100 mL via INTRAVENOUS

## 2022-03-05 NOTE — ED Provider Notes (Signed)
[facility] DEPT Provider Note   CSN: [id number] Arrival date & time: 03/05/22  1257     History  Chief Complaint  Patient presents with   Groin Pain    [name] is a 81 y.o. male.  The history is provided by the patient and medical records. No language interpreter was used.  Groin Pain This is a new problem. The current episode started more than 2 days ago. The problem occurs constantly. The problem has been gradually worsening. Associated symptoms include abdominal pain. Pertinent negatives include no chest pain, no headaches and no shortness of breath. The symptoms are aggravated by bending. Nothing relieves the symptoms. He has tried nothing for the symptoms. The treatment provided no relief.       Home Medications Prior to Admission medications   Medication Sig Start Date End Date Taking? Authorizing Provider  acetaminophen (TYLENOL) 500 MG tablet Take 1,000 mg by mouth every 6 (six) hours as needed for moderate pain.    [provider]  Ascorbic Acid (VITAMIN C PO) Take 1-4 tablets by mouth See admin instructions. Take 1 tablet daily, may increase to 3-4 tabs daily as needed for immune support    [provider]  aspirin 81 MG chewable tablet Chew 1 tablet (81 mg total) by mouth daily. 03/01/22   Cheryln Manly, NP  Carboxymethylcellulose Sod PF (LUBRICANT EYE DROPS PF) 0.5 % SOLN Place 1 drop into both eyes 4 (four) times daily as needed (dry/irritated eyes.).    [provider]  clopidogrel (PLAVIX) 75 MG tablet Take 1 tablet (75 mg total) by mouth daily. 02/23/22   Martinique, Peter M, MD  diclofenac sodium (VOLTAREN) 1 % GEL Apply 1 application topically daily as needed (ankle pain). 11/27/14   Barrett, Evelene Croon, PA-C  diltiazem (CARDIZEM CD) 120 MG 24 hr capsule TAKE 1 CAPSULE BY MOUTH EVERY DAY 03/01/22   Reino Bellis B, NP  fluticasone (FLONASE) 50 MCG/ACT nasal spray PLACE 1 SPRAY INTO BOTH NOSTRILS DAILY AS  NEEDED FOR ALLERGIES OR RHINITIS. 02/22/22   [name], MD  gabapentin (NEURONTIN) 100 MG capsule TAKE 1 CAPSULE BY MOUTH THREE TIMES A DAY Patient taking differently: Take 100-200 mg by mouth See admin instructions. Take 1 capsule (100 mg) by mouth in the morning & take 2 capsules (200 mg) by mouth at night. 11/28/21   [name], MD  isosorbide mononitrate (IMDUR) 60 MG 24 hr tablet Take 1.5 tablets (90 mg total) by mouth daily. 03/01/22   Cheryln Manly, NP  ketoconazole (NIZORAL) 2 % cream Apply 1 application topically 2 (two) times daily as needed for irritation. 08/07/21   [name], MD  levothyroxine (SYNTHROID) 175 MCG tablet Take 1 tablet (175 mcg total) by mouth daily before breakfast. 02/12/22   Richardson Dopp T, PA-C  magnesium oxide (MAG-OX) 400 MG tablet Take 400 mg by mouth daily as needed (restless leg).    [provider]  moxifloxacin (VIGAMOX) 0.5 % ophthalmic solution Place 1 drop into the right eye in the morning and at bedtime.    [provider]  nitroGLYCERIN (NITROSTAT) 0.4 MG SL tablet Place 1 tablet (0.4 mg total) under the tongue every 5 (five) minutes as needed for chest pain. 03/01/22   Cheryln Manly, NP  oxycodone (ROXICODONE) 30 MG immediate release tablet Take 1 tablet (30 mg total) by mouth every 6 (six) hours as needed for pain. 04/29/22 05/29/22  [name], MD  pantoprazole (PROTONIX) 40 MG tablet Take 1 tablet (40 mg total) by mouth daily. 03/01/22   Cheryln Manly, NP  potassium chloride (KLOR-CON) 10 MEQ tablet Take 20 mEq by mouth daily with breakfast. 02/05/22   [provider]  predniSONE (DELTASONE) 10 MG tablet Take 5 tabs a day for 3 days, then 4 a day for 3 days, then 3 a day for 3 days, then 2 a day for 3 days, then 1 a day for 3 days, then stop Patient taking differently: Take 30 mg by mouth daily. 02/09/22   [name], MD  ropinirole (REQUIP) 5 MG tablet Take 1-2 tablets (5-10 mg total) by mouth See admin  instructions. Take 5 mg in the morning and 10 mg at night, may take 5 mg up to 6 times a day as needed for restless legs Patient taking differently: Take 5-10 mg by mouth See admin instructions. Take  1 tablet (5 mg) by mouth in the morning and 2 tablets (10 mg) by mouth at night (scheduled), may take 5 mg up to 6 times a day as needed for restless legs 01/26/22   Darreld Mclean, PA-C  torsemide (DEMADEX) 20 MG tablet Take 0.5 tablets (10 mg total) by mouth See admin instructions. Take 10 mg daily, may take a second 10 mg dose an hour later if he doesn't use the bathroom Patient taking differently: Take 20 mg by mouth every Monday, Wednesday, and Friday. 01/26/22   Darreld Mclean, PA-C  valACYclovir (VALTREX) 500 MG tablet Take 500 mg by mouth in the morning. 02/25/22   [provider]  vitamin B-12 (CYANOCOBALAMIN) 500 MCG tablet Take 500 mcg by mouth daily.    [provider]      Allergies    Brilinta [ticagrelor], Colchicine, Metoprolol tartrate, Mirapex [pramipexole dihydrochloride], Shellfish allergy, Statins, Zolpidem, Coconut (cocos nucifera), Lasix [furosemide], Levaquin [levofloxacin], and Trazodone and nefazodone    Review of Systems   Review of Systems  Constitutional:  Negative for chills, diaphoresis, fatigue and fever.  HENT:  Negative for congestion.   Respiratory:  Negative for cough, chest tightness and shortness of breath.   Cardiovascular:  Negative for chest pain and palpitations.  Gastrointestinal:  Positive for abdominal pain. Negative for constipation, diarrhea, nausea and vomiting.  Genitourinary:  Positive for genital sores and scrotal swelling. Negative for dysuria, flank pain, hematuria, penile pain and testicular pain.  Musculoskeletal:  Negative for back pain, neck pain and neck stiffness.  Skin:  Positive for color change.  Neurological:  Negative for dizziness, light-headedness, numbness and headaches.  Psychiatric/Behavioral:  Negative for  agitation and confusion.     Physical Exam Updated Vital Signs BP 135/82 (BP Location: Left Arm)   Pulse 70   Temp 98.8 F (37.1 C) (Oral)   Resp 18   Ht '5\' 9"'$  (1.753 m)   Wt 100.7 kg   SpO2 95%   BMI 32.78 kg/m  Physical Exam Vitals and nursing note reviewed. Exam conducted with a chaperone present.  Constitutional:      General: He is not in acute distress.    Appearance: He is well-developed.  HENT:     Head: Normocephalic and atraumatic.     Nose: No rhinorrhea.     Mouth/Throat:     Mouth: Mucous membranes are moist.     Pharynx: No oropharyngeal exudate or posterior oropharyngeal erythema.  Eyes:     Conjunctiva/sclera: Conjunctivae normal.  Cardiovascular:     Rate and Rhythm: Normal  rate and regular rhythm.     Heart sounds: No murmur heard. Pulmonary:     Effort: Pulmonary effort is normal. No respiratory distress.     Breath sounds: Normal breath sounds.  Abdominal:     General: Abdomen is flat.     Palpations: Abdomen is soft.     Tenderness: There is abdominal tenderness. There is no right CVA tenderness, left CVA tenderness or guarding.  Genitourinary:    Testes:        Right: Tenderness not present.        Left: Tenderness not present.     Epididymis:     Right: No tenderness.     Left: No tenderness.     Comments: Significant ecchymosis and bruising in the groin.  Testicles themselves nontender.  Intact pulses.  See photo Musculoskeletal:        General: Tenderness present. No swelling.     Cervical back: Neck supple.  Skin:    General: Skin is warm and dry.     Capillary Refill: Capillary refill takes less than 2 seconds.     Findings: Bruising present. No erythema or rash.  Neurological:     General: No focal deficit present.     Mental Status: He is alert.     Sensory: No sensory deficit.     Motor: No weakness.  Psychiatric:        Mood and Affect: Mood normal.         ED Results / Procedures / Treatments   Labs (all labs  ordered are listed, but only abnormal results are displayed) Labs Reviewed  CBC WITH DIFFERENTIAL/PLATELET - Abnormal; Notable for the following components:      Result Value   WBC 12.9 (*)    RDW 15.9 (*)    Neutro Abs 10.0 (*)    Abs Immature Granulocytes 0.57 (*)    All other components within normal limits  COMPREHENSIVE METABOLIC PANEL - Abnormal; Notable for the following components:   CO2 21 (*)    Glucose, Bld 212 (*)    All other components within normal limits  URINALYSIS, ROUTINE W REFLEX MICROSCOPIC    EKG None  Radiology No results found.  Procedures Procedures    Medications Ordered in ED Medications  iohexol (OMNIPAQUE) 350 MG/ML injection 100 mL (100 mLs Intravenous Contrast Given 03/05/22 1532)    ED Course/ Medical Decision Making/ A&P                           Medical Decision Making Amount and/or Complexity of Data Reviewed Labs: ordered. Radiology: ordered.  Risk Prescription drug management.    [name] is a 81 y.o. male with a past medical history significant for CAD, heart failure, small bowel AVMs, hypertension, hyperlipidemia, sleep apnea, paroxysmal A-fib/a flutter on Eliquis therapy that he took this morning, diabetes, and recent heart catheterization on 02/28/2022 (5 days ago) who presents from cardiology clinic for evaluation of groin pain.  According to patient, he is having pain in his primarily right-sided groin with associated bruising and severe pain in his pelvis.  He said that he had his procedure done 5 days ago and since that time has developed worsening bruising and pain across his groin but worse in the right lower pelvis/inguinal area.  He reports that his legs are extremely painful and he has difficulty raising them due to the pain.  He denies any numbness.  He denies any  constipation, diarrhea, or urinary changes but his groin is become extremely ecchymotic.  Patient reports no fevers, chills, chest pain, shortness of  breath, nausea, vomiting, or other complaints aside from the bruising, swelling, and severe groin and leg pains.  Patient went to clinic with cardiology today and was quickly sent here for evaluation and rule out of retroperitoneal bleed, aneurysm, or pseudoaneurysm.  On my exam, patient has impressive bruising in his groin and upper legs.  Distally he does have intact sensation, strength, and pulses but he had pain with hip movement.  No crepitance appreciated.  He reports normal urination.  His scrotum was nontender but was very ecchymotic.  Testicles are nontender.  Flanks and back nontender.  Exam otherwise unremarkable.  I spoke to radiology and confirmed what imaging would be most helpful to rule out vascular injury or abnormality.  They recommend CT abdomen pelvis with instructions to go to the mid thigh.  This was ordered and some lab work.  Care transferred to oncoming team to wait for results of work-up to determine disposition.         Final Clinical Impression(s) / ED Diagnoses Final diagnoses:  Bruising     Clinical Impression: 1. Bruising     Disposition: Care transferred to oncoming team to await results of CT imaging and reassessment.  This note was prepared with assistance of Systems analyst. Occasional wrong-word or sound-a-like substitutions may have occurred due to the inherent limitations of voice recognition software.     Rosmery Duggin, Gwenyth Allegra, MD 03/05/22 1606

## 2022-03-05 NOTE — ED Notes (Signed)
Patient transported to CT 

## 2022-03-05 NOTE — Discharge Instructions (Signed)
Please return for worsening back pain difficulty urinating inability to feel your groin or if your legs are again numb or weak.  Please follow-up with your cardiologist in the office.  Please call them in the morning and tell them about your visit here and see if they want to see you chang your blood thinning medicines.

## 2022-03-05 NOTE — Telephone Encounter (Signed)
Patient states he has been having severe pain in right of his groin below incision since procedure, 8/09. He states the pain worsened on Saturday after a fall and hasn't stopped since. Please advise.

## 2022-03-05 NOTE — ED Provider Notes (Signed)
I received the patient in signout from Dr. Edward Qualia, briefly the patient is a 81 year old male who was seen in cardiology clinic this morning with a large hematoma after having a cardiac catheterization that was done in the femoral location.  He was sent here for CT angiogram to assess for pseudoaneurysm or retroperitoneal hematoma.  Likely this is negative.  I discussed results with patient.  We will have him call his cardiologist in the morning.  Patient has a history of chronic back pain tells me that yesterday he went to a squats and then felt like he could not move his lower legs bilaterally.  Difficult historian, I suspect this was likely due to some muscular spasm as his symptoms have resolved.  He denies any loss of bowel or bladder denies loss of facial sensation has intact pulses motor and sensation to bilateral lower extremities.   Deno Etienne, DO 03/05/22 1625

## 2022-03-05 NOTE — ED Triage Notes (Signed)
Patient arrives by POV in wheelchair c/o having a heart cath done last Wednesday. States they went in through his right groin. C/o pain since then. Reports Saturday evening falling with his back against the wall, had to have neighbors assist him back into his chair.

## 2022-03-05 NOTE — Telephone Encounter (Signed)
Called patient back. Patient stated he has severe pain near is incision site, that is swollen and bruised after a fall on Saturday.Informed patient that he needs to get evaluated as soon as possible since he is on eliquis. Made patient an appointment with the DOD today.

## 2022-03-05 NOTE — Patient Instructions (Addendum)
Medication Instructions:  Your physician has recommended you make the following change in your medication:  STOP eliquis   *If you need a refill on your cardiac medications before your next appointment, please call your pharmacy*   Lab Work: NONE If you have labs (blood work) drawn today and your tests are completely normal, you will receive your results only by: [facility] (if you have MyChart) OR A paper copy in the mail If you have any lab test that is abnormal or we need to change your treatment, we will call you to review the results.   Testing/Procedures: Please report to the ED AS SOON AS POSSIBLE   Follow-Up:to be determined  At [facility] you and your health needs are our priority.  As part of our continuing mission to provide you with exceptional heart care, we have created designated Provider Care Teams.  These Care Teams include your primary Cardiologist (physician) and Advanced Practice Providers (APPs -  Physician Assistants and Nurse Practitioners) who all work together to provide you with the care you need, when you need it.  We recommend signing up for the patient portal called "MyChart".  Sign up information is provided on this After Visit Summary.  MyChart is used to connect with patients for Virtual Visits (Telemedicine).  Patients are able to view lab/test results, encounter notes, upcoming appointments, etc.  Non-urgent messages can be sent to your provider as well.   To learn more about what you can do with MyChart, go to [email or web address].      Important Information About Sugar

## 2022-03-05 NOTE — Progress Notes (Signed)
Cardiology Office Note:    Date:  03/05/2022   ID:  [name], DOB [birth year], MRN [id number]  PCP:  [name], MD   [place] Providers Cardiologist:  [name], MD Electrophysiologist:  Virl Axe, MD     Referring MD: [name], MD   DOD: Post CTO intervention Hematoma  History of Present Illness:    [name] is a 81 y.o. male with a hx of CAD s/p recent CTO procedure, Afib on eliquis.    Patient notes that he was feeling better have CTO procedure but has significant pain with sheath removal.  He has tried not to take too much pain medication as he had one percocet post op and was loopy the next day.  He had his grandchildren with him over the weekend and did not get take any pain medications.  His air conditioning went out over the weekend and the HVAC provider he has recommended taking a fan off.  He went to do this and fell: it is unclear if leg weakness is before or after this.  At last discussion of sx: he has new leg weakness this visit that was not present prior to cath.  He described 10/10 pain over R groin; he needed his friends to help him up.  Pain has persisted.  DOD call; patient was added on.  Took his DOAC this AM.  Past Medical History:  Diagnosis Date   Adenomatous polyp of colon 2007   Arthritis    Atrial flutter ([place])    AVM (arteriovenous malformation) 2011   a. S/p argon plasma coagulation and ablation in 2011, 2017.   Bradycardia    a. H/o almost 7sec pause nocturnally during 2011 admission. Also has h/o fatigue with BB.   CAD (coronary artery disease)    a. Nonobst disease by cath 2009. b. s/p PCI 10/2014 with DES to [place], patent by relook 11/2014 (Brilinta changed to Plavix with improved sx).   Chronic diastolic CHF (congestive heart failure) (HCC)    Depression    Diverticulosis    Gastritis 2011   GERD (gastroesophageal reflux disease)    History of hiatal hernia    Hyperlipidemia     Hypertension    Hypothyroidism    Myocardial infarction ([place])    Obstructive sleep apnea    -no cpap use   Pre-diabetes    Premature atrial contractions Holter 2016   Presence of permanent cardiac pacemaker    PVC's (premature ventricular contractions) Holter 2016   Statin intolerance    Transfusion history    several years ago -GI bleed    Past Surgical History:  Procedure Laterality Date   ANKLE SURGERY Left    APPENDECTOMY     CARDIAC CATHETERIZATION N/A 11/26/2014   Procedure: Left Heart Cath and Coronary Angiography;  Surgeon: Burnell Blanks, MD;  Location: [facility];  Service: Cardiovascular;  Laterality: N/A;   CHOLECYSTECTOMY N/A 08/23/2016   Procedure: LAPAROSCOPIC CHOLECYSTECTOMY;  Surgeon: Coralie Keens, MD;  Location: [facility];  Service: General;  Laterality: N/A;   COLONOSCOPY  08-23-05   per Dr. Deatra Ina, adenomatous polyps, repeat in 5 yrs    COLONOSCOPY WITH PROPOFOL N/A 12/06/2015   Procedure: COLONOSCOPY WITH PROPOFOL;  Surgeon: Doran Stabler, MD;  Location: [facility];  Service: Gastroenterology;  Laterality: N/A;   CORONARY ANGIOGRAPHY N/A 02/28/2022   Procedure: CORONARY ANGIOGRAPHY;  Surgeon: Martinique, Peter M, MD;  Location: [facility];  Service: Cardiovascular;  Laterality: N/A;   CORONARY CTO INTERVENTION N/A 02/28/2022   Procedure: CORONARY CTO INTERVENTION;  Surgeon: Martinique, Peter M, MD;  Location: [facility];  Service: Cardiovascular;  Laterality: N/A;   CORONARY STENT INTERVENTION N/A 01/25/2022   Procedure: CORONARY STENT INTERVENTION;  Surgeon: Burnell Blanks, MD;  Location: [facility];  Service: Cardiovascular;  Laterality: N/A;   ENTEROSCOPY N/A 12/06/2015   Procedure: ENTEROSCOPY;  Surgeon: Doran Stabler, MD;  Location: [facility];  Service: Gastroenterology;  Laterality: N/A;   ESOPHAGOGASTRODUODENOSCOPY  08-23-05   per Dr. Deatra Ina, cauterized jejunal AVMs    [place] N/A 12/06/2015    Procedure: HOT HEMOSTASIS (ARGON PLASMA COAGULATION/BICAP);  Surgeon: Doran Stabler, MD;  Location: [facility];  Service: Gastroenterology;  Laterality: N/A;   INGUINAL HERNIA REPAIR Left 06/29/2021   Procedure: LAPAROSCOPIC LEFT INGUINAL HERNIA REPAIR WITH MESH;  Surgeon: Ralene Ok, MD;  Location: [facility];  Service: General;  Laterality: Left;   INTRAVASCULAR ULTRASOUND/IVUS N/A 02/28/2022   Procedure: Intravascular Ultrasound/IVUS;  Surgeon: Martinique, Peter M, MD;  Location: [facility];  Service: Cardiovascular;  Laterality: N/A;   LEFT HEART CATHETERIZATION WITH CORONARY ANGIOGRAM N/A 09/08/2012   Procedure: LEFT HEART CATHETERIZATION WITH CORONARY ANGIOGRAM;  Surgeon: Burnell Blanks, MD;  Location: [facility];  Service: Cardiovascular;  Laterality: N/A;   LEFT HEART CATHETERIZATION WITH CORONARY ANGIOGRAM N/A 11/16/2014   Procedure: LEFT HEART CATHETERIZATION WITH CORONARY ANGIOGRAM;  Surgeon: Leonie Man, MD;  [facility];  Service: Cardiovascular;  Laterality: N/A;   PACEMAKER IMPLANT N/A 12/24/2016   Procedure: Pacemaker Implant;  Surgeon: Deboraha Sprang, MD;  Location: [facility];  Service: Cardiovascular;  Laterality: N/A;   RIGHT/LEFT HEART CATH AND CORONARY ANGIOGRAPHY N/A 01/25/2022   Procedure: RIGHT/LEFT HEART CATH AND CORONARY ANGIOGRAPHY;  Surgeon: Burnell Blanks, MD;  Location: [facility];  Service: Cardiovascular;  Laterality: N/A;   SKIN GRAFT Right    leg   TONSILLECTOMY     UMBILICAL HERNIA REPAIR N/A 06/29/2021   Procedure: OPEN UMBILICAL HERNIA REPAIR WITH MESH;  Surgeon: Ralene Ok, MD;  Location: [facility];  Service: General;  Laterality: N/A;    Current Medications: Current Meds  Medication Sig   acetaminophen (TYLENOL) 500 MG tablet Take 1,000 mg by mouth every 6 (six) hours as needed for moderate pain.   Ascorbic Acid (VITAMIN C PO) Take 1-4 tablets by mouth See admin instructions. Take 1 tablet daily,  may increase to 3-4 tabs daily as needed for immune support   aspirin 81 MG chewable tablet Chew 1 tablet (81 mg total) by mouth daily.   Carboxymethylcellulose Sod PF (LUBRICANT EYE DROPS PF) 0.5 % SOLN Place 1 drop into both eyes 4 (four) times daily as needed (dry/irritated eyes.).   clopidogrel (PLAVIX) 75 MG tablet Take 1 tablet (75 mg total) by mouth daily.   diclofenac sodium (VOLTAREN) 1 % GEL Apply 1 application topically daily as needed (ankle pain).   diltiazem (CARDIZEM CD) 120 MG 24 hr capsule TAKE 1 CAPSULE BY MOUTH EVERY DAY   fluticasone (FLONASE) 50 MCG/ACT nasal spray PLACE 1 SPRAY INTO BOTH NOSTRILS DAILY AS NEEDED FOR ALLERGIES OR RHINITIS.   gabapentin (NEURONTIN) 100 MG capsule TAKE 1 CAPSULE BY MOUTH THREE TIMES A DAY (Patient taking differently: Take 100-200 mg by mouth See admin instructions. Take 1 capsule (100 mg) by mouth in the  morning & take 2 capsules (200 mg) by mouth at night.)   isosorbide mononitrate (IMDUR) 60 MG 24 hr tablet Take 1.5 tablets (90 mg total) by mouth daily.   ketoconazole (NIZORAL) 2 % cream Apply 1 application topically 2 (two) times daily as needed for irritation.   levothyroxine (SYNTHROID) 175 MCG tablet Take 1 tablet (175 mcg total) by mouth daily before breakfast.   magnesium oxide (MAG-OX) 400 MG tablet Take 400 mg by mouth daily as needed (restless leg).   moxifloxacin (VIGAMOX) 0.5 % ophthalmic solution Place 1 drop into the right eye in the morning and at bedtime.   nitroGLYCERIN (NITROSTAT) 0.4 MG SL tablet Place 1 tablet (0.4 mg total) under the tongue every 5 (five) minutes as needed for chest pain.   [START ON 04/29/2022] oxycodone (ROXICODONE) 30 MG immediate release tablet Take 1 tablet (30 mg total) by mouth every 6 (six) hours as needed for pain.   pantoprazole (PROTONIX) 40 MG tablet Take 1 tablet (40 mg total) by mouth daily.   potassium chloride (KLOR-CON) 10 MEQ tablet Take 20 mEq by mouth daily with breakfast.   predniSONE  (DELTASONE) 10 MG tablet Take 5 tabs a day for 3 days, then 4 a day for 3 days, then 3 a day for 3 days, then 2 a day for 3 days, then 1 a day for 3 days, then stop (Patient taking differently: Take 30 mg by mouth daily.)   ropinirole (REQUIP) 5 MG tablet Take 1-2 tablets (5-10 mg total) by mouth See admin instructions. Take 5 mg in the morning and 10 mg at night, may take 5 mg up to 6 times a day as needed for restless legs (Patient taking differently: Take 5-10 mg by mouth See admin instructions. Take  1 tablet (5 mg) by mouth in the morning and 2 tablets (10 mg) by mouth at night (scheduled), may take 5 mg up to 6 times a day as needed for restless legs)   torsemide (DEMADEX) 20 MG tablet Take 0.5 tablets (10 mg total) by mouth See admin instructions. Take 10 mg daily, may take a second 10 mg dose an hour later if he doesn't use the bathroom (Patient taking differently: Take 20 mg by mouth every Monday, Wednesday, and Friday.)   valACYclovir (VALTREX) 500 MG tablet Take 500 mg by mouth in the morning.   vitamin B-12 (CYANOCOBALAMIN) 500 MCG tablet Take 500 mcg by mouth daily.   [DISCONTINUED] apixaban (ELIQUIS) 5 MG TABS tablet Take 1 tablet (5 mg total) by mouth 2 (two) times daily.     Allergies:   Brilinta [ticagrelor], Colchicine, Metoprolol tartrate, Mirapex [pramipexole dihydrochloride], Shellfish allergy, Statins, Zolpidem, Coconut (cocos nucifera), Lasix [furosemide], Levaquin [levofloxacin], and Trazodone and nefazodone   Social History   Socioeconomic History   Marital status: Widowed    Spouse name: Not on file   Number of children: 1   Years of education: 81   Highest education level: 12th grade  Occupational History   Occupation: Disabled    Employer: UNEMPLOYED  Tobacco Use   Smoking status: Former    Packs/day: 2.00    Years: 50.00    Total pack years: 100.00    Types: Cigarettes    Quit date: 07/23/2002    Years since quitting: 19.6    Passive exposure: Past   Smokeless  tobacco: Never  Vaping Use   Vaping Use: Never used  Substance and Sexual Activity   Alcohol use: No    Alcohol/week: 0.0  standard drinks of alcohol   Drug use: No   Sexual activity: Yes  Other Topics Concern   Not on file  Social History Narrative   Not on file   Social Determinants of Health   Financial Resource Strain: Low Risk  (08/29/2021)   Overall Financial Resource Strain (CARDIA)    Difficulty of Paying Living Expenses: Not very hard  Food Insecurity: No Food Insecurity (08/29/2021)   Hunger Vital Sign    Worried About Running Out of Food in the Last Year: Never true    Ran Out of Food in the Last Year: Never true  Transportation Needs: No Transportation Needs (08/29/2021)   PRAPARE - Hydrologist (Medical): No    Lack of Transportation (Non-Medical): No  Physical Activity: Inactive (08/29/2021)   Exercise Vital Sign    Days of Exercise per Week: 0 days    Minutes of Exercise per Session: 0 min  Stress: No Stress Concern Present (03/20/2021)   [place]    Feeling of Stress : Not at all  Social Connections: Socially Isolated (08/29/2021)   Social Connection and Isolation Panel [NHANES]    Frequency of Communication with Friends and Family: More than three times a week    Frequency of Social Gatherings with Friends and Family: More than three times a week    Attends Religious Services: Never    Marine scientist or Organizations: No    Attends Archivist Meetings: Never    Marital Status: Widowed     Family History: The patient's family history includes Alcoholism in his maternal grandfather and paternal uncle; Heart attack in his father; Heart disease in his father; Leukemia in his mother; Stroke in his father. There is no history of Colon cancer or Esophageal cancer.  ROS:   Please see the history of present illness.     All other systems reviewed and are  negative.  EKGs/Labs/Other Studies Reviewed:      Recent Labs: 07/31/2021: Magnesium 1.8 01/22/2022: NT-Pro BNP 116 02/09/2022: TSH 0.045 03/01/2022: BUN 16; Creatinine, Ser 1.11; Hemoglobin 12.3; Platelets 239; Potassium 3.8; Sodium 138  Recent Lipid Panel    Component Value Date/Time   CHOL 148 12/11/2017 0837   TRIG 113 12/11/2017 0837   HDL 38 (L) 12/11/2017 0837   CHOLHDL 3.9 12/11/2017 0837   CHOLHDL 4.6 03/22/2016 0935   VLDL 46 (H) 03/22/2016 0935   LDLCALC 87 12/11/2017 0837         Physical Exam:    VS:  BP 130/60 (BP Location: Left Arm, Patient Position: Sitting, Cuff Size: Normal)   Pulse 68   Ht '5\' 9"'$  (1.753 m)   Wt 222 lb (100.7 kg)   BMI 32.78 kg/m     Wt Readings from Last 3 Encounters:  03/05/22 222 lb (100.7 kg)  02/28/22 217 lb (98.4 kg)  02/23/22 227 lb 3.2 oz (103.1 kg)     GEN: Elderly male moderate distress HEENT: Normal CARDIAC: RRR, no murmurs, rubs, gallops RESPIRATORY:  Clear to auscultation without rales, wheezing or rhonchi  ABDOMEN: Soft, non-tender, non-distended MUSCULOSKELETAL:  There is bruising over bilateral groins sites.  Superior to the R femoral site there may be a small hematoma; slightly tense feeling.  10/10 pain; will not let me push for further assessment.  No bruit or either femoral site; let femoral is benign to touch no hematoma NEUROLOGIC:  Alert and oriented x 3, 4/5  strength bilaterally  ASSESSMENT:    1. Coronary artery disease of native artery of native heart with stable angina pectoris (HCC)   2. Paroxysmal atrial fibrillation ([place])   3. Fall, initial encounter    PLAN:    S/p Fall New bilateral leg weakness CAD with recent procedural intervention AFL on eliquis New anemia (Hgb 15->12 peri CTO) - Recommend ED Assessment: CTA with IV Contrast to rule out RP Bleed  - stat CBC; he is amenable and consented for blood transfusion if needed - stop eliquis until these assessments can be made  We have offered  transport to ED: he notes that he has friends who can help him and defers Discussed with inpatient DOD and interventionalist   Medication Adjustments/Labs and Tests Ordered: Current medicines are reviewed at length with the patient today.  Concerns regarding medicines are outlined above.  No orders of the defined types were placed in this encounter.  No orders of the defined types were placed in this encounter.   Patient Instructions  Medication Instructions:  Your physician has recommended you make the following change in your medication:  STOP eliquis   *If you need a refill on your cardiac medications before your next appointment, please call your pharmacy*   Lab Work: NONE If you have labs (blood work) drawn today and your tests are completely normal, you will receive your results only by: [place] (if you have MyChart) OR A paper copy in the mail If you have any lab test that is abnormal or we need to change your treatment, we will call you to review the results.   Testing/Procedures: Please report to the ED AS SOON AS POSSIBLE   Follow-Up:to be determined  At [facility], you and your health needs are our priority.  As part of our continuing mission to provide you with exceptional heart care, we have created designated Provider Care Teams.  These Care Teams include your primary Cardiologist (physician) and Advanced Practice Providers (APPs -  Physician Assistants and Nurse Practitioners) who all work together to provide you with the care you need, when you need it.  We recommend signing up for the patient portal called "MyChart".  Sign up information is provided on this After Visit Summary.  MyChart is used to connect with patients for Virtual Visits (Telemedicine).  Patients are able to view lab/test results, encounter notes, upcoming appointments, etc.  Non-urgent messages can be sent to your provider as well.   To learn more about what you can do with MyChart, go  to [email or web address].      Important Information About Sugar         Signed, Werner Lean, MD  03/05/2022 12:30 PM    [place]

## 2022-03-07 ENCOUNTER — Ambulatory Visit: Payer: Self-pay | Admitting: Licensed Clinical Social Worker

## 2022-03-07 ENCOUNTER — Telehealth (HOSPITAL_COMMUNITY): Payer: Self-pay

## 2022-03-07 DIAGNOSIS — I480 Paroxysmal atrial fibrillation: Secondary | ICD-10-CM

## 2022-03-07 DIAGNOSIS — E1140 Type 2 diabetes mellitus with diabetic neuropathy, unspecified: Secondary | ICD-10-CM

## 2022-03-07 NOTE — Telephone Encounter (Signed)
Pt insurance is active and benefits verified through [place] $10, DED $350/0 met, out of pocket $8,300/$650.09 met, co-insurance 0%. no pre-authorization required. Passport, 03/07/2022'@10' :55am, REF# [id number]   How many CR sessions are covered? (72 sessions for ICR)72 Is this a lifetime maximum or an annual maximum? annual Has the member used any of these services to date? no Is there a time limit (weeks/months) on start of program and/or program completion? no     Will contact patient to see if he is interested in the Cardiac Rehab Program. If interested, patient will need to complete follow up appt. Once completed, patient will be contacted for scheduling upon review by the RN Navigator.

## 2022-03-09 ENCOUNTER — Telehealth: Payer: Self-pay | Admitting: *Deleted

## 2022-03-09 NOTE — Telephone Encounter (Signed)
   Telephone encounter was:  Successful.  03/09/2022 Name: [name] MRN: [id number] DOB: 08-22-1940  [name] is a 81 y.o. year old male who is a primary care patient of [name], MD . The community resource team was consulted for assistance with [place] guide performed the following interventions: Patient provided with information about care guide support team and interviewed to confirm resource needs. Provided food bank and food stamps. Also MOW waitlist and out [facility] food program  Follow Up Plan:  No further follow up planned at this time. The patient has been provided with needed resources.  [place] [phone] [address] , [place] [address] Email : [email or web address]

## 2022-03-09 NOTE — Telephone Encounter (Signed)
   Telephone encounter was:  Successful.  03/09/2022 Name: [name] MRN: [id number] DOB: June 16, 1941  [name] is a 81 y.o. year old male who is a primary care patient of [name], MD . The community resource team was consulted for assistance with Food Insecurity and Financial Difficulties related to medication   Care guide performed the following interventions: Patient provided with information about care guide support team and interviewed to confirm resource needs Follow up call placed to community resources to determine status of patients referral Follow up call placed to the patient to discuss status of referral.  Follow Up Plan:  No further follow up planned at this time. The patient has been provided with needed resources.  [place] [phone] [address] , [place] [address] Email : [email or web address]

## 2022-03-09 NOTE — Patient Outreach (Signed)
  Care Coordination   Initial Visit Note   03/09/2022 Name: [name] MRN: [id number] DOB: Mar 01, 1941  [name] is a 81 y.o. year old male who sees Christopher Morale, MD for primary care. I spoke with  [name] by phone today  What matters to the patients health and wellness today?  Food insecurity resources    Goals Addressed             This Visit's Progress    Managment of Stress and Obtain Supportive Resources   On track    Care Coordination Interventions: Solution-Focused Strategies employed:  Active listening / Reflection utilized  Emotional Support Provided Consideration of in-home help encouraged : options discussed; however, pt can not afford any out of pocket costs at this time. Reports difficulty affording medicine Verbalization of feelings encouraged  Referral to Care Guide completed to assist with application to MOW or [name] Pt has very limited support. States [name] agreed to start a go fund me for pt [name] collaborated with RN to speak with pt about management of chronic health conditions         SDOH assessments and interventions completed:  Yes  SDOH Interventions Today    Flowsheet Row Most Recent Value  SDOH Interventions   Food Insecurity Interventions Other (Comment)  [Care Guide referral]  Housing Interventions Intervention Not Indicated        Care Coordination Interventions Activated:  Yes  Care Coordination Interventions:  Yes, provided   Follow up plan: Follow up call scheduled for 2-4 weeks    Encounter Outcome:  Pt. Visit Completed   [name], MSW, [name] Phone [phone] 8:30 AM'

## 2022-03-09 NOTE — Patient Instructions (Signed)
Visit Information  Thank you for taking time to visit with me today. Please don't hesitate to contact me if I can be of assistance to you.   Following are the goals we discussed today:   Goals Addressed             This Visit's Progress    Managment of Stress and Obtain Supportive Resources   On track    Care Coordination Interventions: Solution-Focused Strategies employed:  Active listening / Reflection utilized  Emotional Support Provided Consideration of in-home help encouraged : options discussed; however, pt can not afford any out of pocket costs at this time. Reports difficulty affording medicine Verbalization of feelings encouraged  Referral to Care Guide completed to assist with application to MOW or [name] Pt has very limited support. States Joslyn Hy agreed to start a go fund me for pt [name] collaborated with RN to speak with pt about management of chronic health conditions         Our next appointment is by telephone on 03/21/22 at 2:00 PM  Please call the care guide team at [phone] if you need to cancel or reschedule your appointment.   If you are experiencing a Mental Health or [place] or need someone to talk to, please call the Suicide and Crisis Lifeline: 988 call 911   Patient verbalizes understanding of instructions and care plan provided today and agrees to view in [place]. Active MyChart status and patient understanding of how to access instructions and care plan via MyChart confirmed with patient.     [name], MSW, [email or web address] Phone [phone] 8:32 AM

## 2022-03-09 NOTE — Addendum Note (Signed)
Addended by: [name] D on: 03/09/2022 08:36 AM   Modules accepted: Orders

## 2022-03-12 NOTE — Progress Notes (Signed)
Cardiology Office Note   Date:  03/16/2022   ID:  [name], [name] Feb 16, 1941, MRN [id number]  PCP:  [name], MD  Cardiologist:  Darlina Guys MD  No chief complaint on file.     History of Present Illness: [name] is a 81 y.o. male who is seen at the request of Dr [name] for consideration of CTO PCI. He is s/p stenting of the LAD and PDA in 2016. Over the last several mos, he notes exertional chest pain and shortness of breath. He has symptoms with just minimal activity.  He sometimes has chest pain at rest. He has to take NTG sometimes to improve his symptoms.  He sleeps on an incline chronically.  He has been awoken by shortness of breath at night as well as chest pain and takes NTG for relief.  He underwent cardiac cath on July 6 showing severe disease in the apical LAD. 70% mid to distal LAD, patent proximal- mid LAD stent and now occlusion of mid RCA with left to right collaterals. Attempted to cross with wire but unsuccessful. Right heart pressures were normal.  Medical therapy was intensified. Echo showed normal LV function. He has a history of HLD, HTN and Atrial fib/flutter and is s/p pacemaker for SSS.   He did undergo CTO PCI of the RCA with DES on August 9. On 03/05/22 he did suffer a fall and complained of severe right groin pain and bilateral leg weakness. Was seen and exam showed small hematoma on the right with some bruising. Referred to the ED where CT showed small hematoma without retroperitoneal bleed, PSA or AV fistula. UA and CMET were OK. Hgb actually improved.   On follow up today he noted some bruising at his groin sites but this is getting better. He has some leg weakness still but also better. He has no chest pain or pressure but still complains of SOB.     Past Medical History:  Diagnosis Date   Adenomatous polyp of colon 2007   Arthritis    Atrial flutter ([place])    AVM (arteriovenous malformation) 2011   a. S/p argon plasma  coagulation and ablation in 2011, 2017.   Bradycardia    a. H/o almost 7sec pause nocturnally during 2011 admission. Also has h/o fatigue with BB.   CAD (coronary artery disease)    a. Nonobst disease by cath 2009. b. s/p PCI 10/2014 with DES to [place], patent by relook 11/2014 (Brilinta changed to Plavix with improved sx).   Chronic diastolic CHF (congestive heart failure) (HCC)    Depression    Diverticulosis    Gastritis 2011   GERD (gastroesophageal reflux disease)    History of hiatal hernia    Hyperlipidemia    Hypertension    Hypothyroidism    Myocardial infarction ([place])    Obstructive sleep apnea    -no cpap use   Pre-diabetes    Premature atrial contractions Holter 2016   Presence of permanent cardiac pacemaker    PVC's (premature ventricular contractions) Holter 2016   Statin intolerance    Transfusion history    several years ago -GI bleed    Past Surgical History:  Procedure Laterality Date   ANKLE SURGERY Left    APPENDECTOMY     CARDIAC CATHETERIZATION N/A 11/26/2014   Procedure: Left Heart Cath and Coronary Angiography;  Surgeon: Burnell Blanks, MD;  Location: [facility];  Service: Cardiovascular;  Laterality: N/A;  CHOLECYSTECTOMY N/A 08/23/2016   Procedure: LAPAROSCOPIC CHOLECYSTECTOMY;  Surgeon: Coralie Keens, MD;  Location: [facility];  Service: General;  Laterality: N/A;   COLONOSCOPY  08-23-05   per Dr. Deatra Ina, adenomatous polyps, repeat in 5 yrs    COLONOSCOPY WITH PROPOFOL N/A 12/06/2015   Procedure: COLONOSCOPY WITH PROPOFOL;  Surgeon: Doran Stabler, MD;  Location: [facility];  Service: Gastroenterology;  Laterality: N/A;   CORONARY ANGIOGRAPHY N/A 02/28/2022   Procedure: CORONARY ANGIOGRAPHY;  Surgeon: Martinique, Truett Mcfarlan M, MD;  Location: [facility];  Service: Cardiovascular;  Laterality: N/A;   CORONARY CTO INTERVENTION N/A 02/28/2022   Procedure: CORONARY CTO INTERVENTION;  Surgeon: Martinique, Victorious Cosio M, MD;  Location: [facility];  Service: Cardiovascular;  Laterality: N/A;   CORONARY STENT INTERVENTION N/A 01/25/2022   Procedure: CORONARY STENT INTERVENTION;  Surgeon: Burnell Blanks, MD;  Location: [facility];  Service: Cardiovascular;  Laterality: N/A;   ENTEROSCOPY N/A 12/06/2015   Procedure: ENTEROSCOPY;  Surgeon: Doran Stabler, MD;  Location: [facility];  Service: Gastroenterology;  Laterality: N/A;   ESOPHAGOGASTRODUODENOSCOPY  08-23-05   per Dr. Deatra Ina, cauterized jejunal AVMs    [place] N/A 12/06/2015   Procedure: HOT HEMOSTASIS (ARGON PLASMA COAGULATION/BICAP);  Surgeon: Doran Stabler, MD;  Location: [facility];  Service: Gastroenterology;  Laterality: N/A;   INGUINAL HERNIA REPAIR Left 06/29/2021   Procedure: LAPAROSCOPIC LEFT INGUINAL HERNIA REPAIR WITH MESH;  Surgeon: Ralene Ok, MD;  Location: [facility];  Service: General;  Laterality: Left;   INTRAVASCULAR ULTRASOUND/IVUS N/A 02/28/2022   Procedure: Intravascular Ultrasound/IVUS;  Surgeon: Martinique, Calixto Pavel M, MD;  Location: [facility];  Service: Cardiovascular;  Laterality: N/A;   LEFT HEART CATHETERIZATION WITH CORONARY ANGIOGRAM N/A 09/08/2012   Procedure: LEFT HEART CATHETERIZATION WITH CORONARY ANGIOGRAM;  Surgeon: Burnell Blanks, MD;  Location: [facility];  Service: Cardiovascular;  Laterality: N/A;   LEFT HEART CATHETERIZATION WITH CORONARY ANGIOGRAM N/A 11/16/2014   Procedure: LEFT HEART CATHETERIZATION WITH CORONARY ANGIOGRAM;  Surgeon: Leonie Man, MD;  Location: [facility];  Service: Cardiovascular;  Laterality: N/A;   PACEMAKER IMPLANT N/A 12/24/2016   Procedure: Pacemaker Implant;  Surgeon: Deboraha Sprang, MD;  Location: [facility];  Service: Cardiovascular;  Laterality: N/A;   RIGHT/LEFT HEART CATH AND CORONARY ANGIOGRAPHY N/A 01/25/2022   Procedure: RIGHT/LEFT HEART CATH AND CORONARY ANGIOGRAPHY;  Surgeon: Burnell Blanks, MD;  Location: [facility];   Service: Cardiovascular;  Laterality: N/A;   SKIN GRAFT Right    leg   TONSILLECTOMY     UMBILICAL HERNIA REPAIR N/A 06/29/2021   Procedure: OPEN UMBILICAL HERNIA REPAIR WITH MESH;  Surgeon: Ralene Ok, MD;  Location: [facility];  Service: General;  Laterality: N/A;     Current Outpatient Medications  Medication Sig Dispense Refill   acetaminophen (TYLENOL) 500 MG tablet Take 1,000 mg by mouth every 6 (six) hours as needed for moderate pain.     albuterol (VENTOLIN HFA) 108 (90 Base) MCG/ACT inhaler Inhale 2 puffs into the lungs every 6 (six) hours as needed for wheezing or shortness of breath. 8 g 2   Ascorbic Acid (VITAMIN C PO) Take 1-4 tablets by mouth See admin instructions. Take 1 tablet daily, may increase to 3-4 tabs daily as needed for immune support     Carboxymethylcellulose Sod PF (LUBRICANT EYE DROPS PF) 0.5 % SOLN Place 1 drop into both eyes  4 (four) times daily as needed (dry/irritated eyes.).     clopidogrel (PLAVIX) 75 MG tablet Take 1 tablet (75 mg total) by mouth daily. 90 tablet 3   diclofenac sodium (VOLTAREN) 1 % GEL Apply 1 application topically daily as needed (ankle pain). 5 Tube 10   diltiazem (CARDIZEM CD) 120 MG 24 hr capsule TAKE 1 CAPSULE BY MOUTH EVERY DAY 90 capsule 1   fluticasone (FLONASE) 50 MCG/ACT nasal spray PLACE 1 SPRAY INTO BOTH NOSTRILS DAILY AS NEEDED FOR ALLERGIES OR RHINITIS. 48 mL 1   gabapentin (NEURONTIN) 100 MG capsule TAKE 1 CAPSULE BY MOUTH THREE TIMES A DAY (Patient taking differently: Take 100-200 mg by mouth See admin instructions. Take 1 capsule (100 mg) by mouth in the morning & take 2 capsules (200 mg) by mouth at night.) 90 capsule 1   isosorbide mononitrate (IMDUR) 60 MG 24 hr tablet Take 1.5 tablets (90 mg total) by mouth daily. 90 tablet 1   ketoconazole (NIZORAL) 2 % cream Apply 1 application topically 2 (two) times daily as needed for irritation. 30 g 5   levothyroxine (SYNTHROID) 175 MCG tablet Take 1 tablet (175 mcg total) by  mouth daily before breakfast. 90 tablet 1   magnesium oxide (MAG-OX) 400 MG tablet Take 400 mg by mouth daily as needed (restless leg).     moxifloxacin (VIGAMOX) 0.5 % ophthalmic solution Place 1 drop into the right eye in the morning and at bedtime.     nitroGLYCERIN (NITROSTAT) 0.4 MG SL tablet Place 1 tablet (0.4 mg total) under the tongue every 5 (five) minutes as needed for chest pain. 25 tablet 1   [START ON 04/29/2022] oxycodone (ROXICODONE) 30 MG immediate release tablet Take 1 tablet (30 mg total) by mouth every 6 (six) hours as needed for pain. 120 tablet 0   pantoprazole (PROTONIX) 40 MG tablet Take 1 tablet (40 mg total) by mouth daily. 30 tablet 2   potassium chloride (KLOR-CON) 10 MEQ tablet Take 20 mEq by mouth daily with breakfast.     predniSONE (DELTASONE) 10 MG tablet Take 5 tabs a day for 3 days, then 4 a day for 3 days, then 3 a day for 3 days, then 2 a day for 3 days, then 1 a day for 3 days, then stop (Patient taking differently: Take 30 mg by mouth daily.) 45 tablet 0   ranolazine (RANEXA) 500 MG 12 hr tablet Take 500 mg by mouth 2 (two) times daily.     ropinirole (REQUIP) 5 MG tablet Take 1-2 tablets (5-10 mg total) by mouth See admin instructions. Take 5 mg in the morning and 10 mg at night, may take 5 mg up to 6 times a day as needed for restless legs (Patient taking differently: Take 5-10 mg by mouth See admin instructions. Take  1 tablet (5 mg) by mouth in the morning and 2 tablets (10 mg) by mouth at night (scheduled), may take 5 mg up to 6 times a day as needed for restless legs)     torsemide (DEMADEX) 20 MG tablet Take 0.5 tablets (10 mg total) by mouth See admin instructions. Take 10 mg daily, may take a second 10 mg dose an hour later if he doesn't use the bathroom (Patient taking differently: Take 20 mg by mouth every Monday, Wednesday, and Friday.)     valACYclovir (VALTREX) 500 MG tablet Take 500 mg by mouth in the morning.     vitamin B-12 (CYANOCOBALAMIN) 500 MCG  tablet Take 500  mcg by mouth daily.     No current facility-administered medications for this visit.    Allergies:   Brilinta [ticagrelor], Colchicine, Metoprolol tartrate, Mirapex [pramipexole dihydrochloride], Shellfish allergy, Statins, Zolpidem, Coconut (cocos nucifera), Lasix [furosemide], Levaquin [levofloxacin], and Trazodone and nefazodone    Social History:  The patient  reports that he quit smoking about 19 years ago. His smoking use included cigarettes. He has a 100.00 pack-year smoking history. He has been exposed to tobacco smoke. He has never used smokeless tobacco. He reports that he does not drink alcohol and does not use drugs.   Family History:  The patient's family history includes Alcoholism in his maternal grandfather and paternal uncle; Heart attack in his father; Heart disease in his father; Leukemia in his mother; Stroke in his father.    ROS:  Please see the history of present illness.   Otherwise, review of systems are positive for none.   All other systems are reviewed and negative.    PHYSICAL EXAM: VS:  BP (!) 145/64   Pulse 66   Ht '5\' 10"'$  (1.778 m)   Wt 230 lb 12.8 oz (104.7 kg)   SpO2 94%   BMI 33.12 kg/m  , BMI Body mass index is 33.12 kg/m. GEN: Well nourished, well developed, in no acute distress HEENT: normal Neck: no JVD, carotid bruits, or masses Cardiac: RRR; no murmurs, rubs, or gallops,no edema  Respiratory:  clear to auscultation bilaterally, normal work of breathing GI: soft, nontender, nondistended, + BS Groins with some old bruising - no hematoma MS: no deformity or atrophy Skin: warm and dry, no rash Neuro:  Strength and sensation are intact Psych: euthymic mood, full affect   EKG:  EKG is not ordered today. The ekg ordered today demonstrates N/A   Recent Labs: 07/31/2021: Magnesium 1.8 01/22/2022: NT-Pro BNP 116 02/09/2022: TSH 0.045 03/05/2022: ALT 14; BUN 22; Creatinine, Ser 1.11; Hemoglobin 13.9; Platelets 316; Potassium 4.4;  Sodium 136    Lipid Panel    Component Value Date/Time   CHOL 148 12/11/2017 0837   TRIG 113 12/11/2017 0837   HDL 38 (L) 12/11/2017 0837   CHOLHDL 3.9 12/11/2017 0837   CHOLHDL 4.6 03/22/2016 0935   VLDL 46 (H) 03/22/2016 0935   LDLCALC 87 12/11/2017 0837      Wt Readings from Last 3 Encounters:  03/16/22 230 lb 12.8 oz (104.7 kg)  03/05/22 222 lb (100.7 kg)  03/05/22 222 lb (100.7 kg)      Other studies Reviewed: Additional studies/ records that were reviewed today include:   Cardiac cath 01/25/22: Procedures  CORONARY STENT INTERVENTION  RIGHT/LEFT HEART CATH AND CORONARY ANGIOGRAPHY   Conclusion      Ost LAD to Prox LAD lesion is 50% stenosed.   Mid Cx lesion is 30% stenosed.   Prox RCA lesion is 20% stenosed.   Mid RCA lesion is 100% stenosed.   Mid LAD lesion is 20% stenosed.   Dist LAD-1 lesion is 70% stenosed.   Dist LAD-2 lesion is 90% stenosed.   Previously placed RPDA stent of unknown type is  widely patent.   Patent mid LAD stent. The distal LAD becomes small in caliber and has diffuse disease, not favorable for PCI given vessel size and diffuse nature of disease.  The Circumflex has mild plaque The dominant RCA has a 100% mid occlusion (CTO). The distal branches fill from left to right collaterals.  4.  Attempted PCI of the mid RCA CTO, unsuccessful 5.  Normal right  and left heart pressures (RA 7, RV 30/9/17, PA 32/17 mean 24, PCWP 9, LV 132/11/10, AO 134/82)   Recommendations: Mr. Glaze has many complaints including dyspnea and chest pressure. No favorable targets for PCI. The RCA is now completely occluded but fills from left to right collaterals. Attempted CTO PCI unsuccessful. I do not think PCI of the distal LAD is a good option. For now, will continue medical management of CAD. Will admit to telemetry overnight given many complaints. Will arrange an echo today. Will add Ranexa 500 mg po BID. I will have our CTO team review his films but I question  whether he would have symptomatic improvement with PCI of the RCA.    Diagnostic Dominance: Right  Intervent  Echo 01/25/22: IMPRESSIONS     1. Left ventricular ejection fraction, by estimation, is 55 to 60%. The  left ventricle has normal function. The left ventricle has no regional  wall motion abnormalities. Left ventricular diastolic parameters are  consistent with Grade I diastolic  dysfunction (impaired relaxation).   2. Right ventricular systolic function is normal. The right ventricular  size is mildly enlarged.   3. The mitral valve is normal in structure. Trivial mitral valve  regurgitation. No evidence of mitral stenosis.   4. The aortic valve is tricuspid. There is mild calcification of the  aortic valve. There is mild thickening of the aortic valve. Aortic valve  regurgitation is not visualized. Aortic valve sclerosis is present, with  no evidence of aortic valve stenosis.  Aortic valve mean gradient measures 4.0 mmHg. Aortic valve Vmax measures  1.33 m/s.   5. The inferior vena cava is normal in size with greater than 50%  respiratory variability, suggesting right atrial pressure of 3 mmHg.   Comparison(s): Prior images reviewed side by side.   CTO PCI 02/28/22:  CORONARY ANGIOGRAPHY  CORONARY CTO INTERVENTION  Intravascular Ultrasound/IVUS   Conclusion      Mid RCA lesion is 100% stenosed.   A drug-eluting stent was successfully placed using a SYNERGY XD 3.0X38.   Post intervention, there is a 0% residual stenosis.   Successful PCI of CTO of the RCA with IVUS guidance and DES x 1.    Plan: observe overnight on telemetry. Remove sheaths manually. Anticipate DC in am. ASA 81 mg for one month. Plavix for 21 months. May resume Eliquis tomorrow if no bleeding issues.     Diagnostic Dominance: Right  Intervention    ASSESSMENT AND PLAN:  1.  CAD with remote stenting of the proximal to mid LAD and PDA in 2016. Recently had repeat cath showing new occlusion  of the distal RCA at the crux. There are left to right collaterals. He was symptomatic despite optimal medical therapy. Unsuccessful PCI before- unable to cross with wire. On August 9 underwent successful CTO PCI of the RCA with DES. Recovering well. May go ahead and stop ASA. Continue Plavix and Eliquis. Keep follow up with Dr [name] in October. OK to start cardiac Rehab.  2. HFpEF. Right heart hemodynamics looked good on recent cath 3. PAfib 4. HLD intolerant of statins 5. HTN 6. Sinus node dysfunction s/p PPM      Cardiac Rehabilitation Eligibility Assessment  The patient is ready to start cardiac rehabilitation from a cardiac standpoint.      Disposition:   follow up with Dr [name] in October  Signed, Derreck Wiltsey Martinique, MD  03/16/2022 1:15 PM    [facility] [address] [place], [place], [address]  Phone [phone], Fax [phone]

## 2022-03-15 ENCOUNTER — Ambulatory Visit: Payer: Self-pay | Admitting: *Deleted

## 2022-03-15 ENCOUNTER — Encounter: Payer: Self-pay | Admitting: *Deleted

## 2022-03-15 NOTE — Patient Outreach (Signed)
  Care Coordination   Initial Visit Note   03/15/2022 Name: [name] MRN: [id number] DOB: 10-12-1940  [name] is a 81 y.o. year old male who sees [name], MD for primary care. I spoke with  [name] by phone today  What matters to the patients health and wellness today?  Post op issues r/t medications/pain management/dizziness    Goals Addressed               This Visit's Progress     Medications/post op pain/dizziness (pt-stated)        Care Coordination Interventions: Reviewed medications with patient and discussed purpose for all medications Collaborated with Dr. Martinique regarding pt's inquiry on Eliquis/Plavix and ASA 81 mg along with symptoms of dizziness since his recent cardiac surgery. Reviewed scheduled/upcoming provider appointments including pending appointments and verified pt has had his AWV with next year's scheduled Discussed plans with patient for ongoing care management follow up and provided patient with direct contact information for care management team Advised patient to discuss all the ongoing above issues along with pain management on his appointment tomorrow's appointment  with provider Assessed social determinant of health barriers Other requested for assistance cleaning his home however declined care-guides sending community resources. Also encourage pt if he decides on outside resources to discuss all issues on the next outreach with the social worker Jasmine on the next upcoming appointment          SDOH assessments and interventions completed:  Yes  SDOH Interventions Today    Flowsheet Row Most Recent Value  SDOH Interventions   Food Insecurity Interventions Intervention Not Indicated  Housing Interventions Intervention Not Indicated  Transportation Interventions Intervention Not Indicated        Care Coordination Interventions Activated:  Yes  Care Coordination Interventions:  Yes, provided   Follow up  plan: Follow up call scheduled for 03/29/2022 @ 11:30 AM    Encounter Outcome:  Pt. Visit Completed   Raina Mina, RN Care Management Coordinator [place] Office [phone]

## 2022-03-15 NOTE — Patient Instructions (Signed)
Visit Information  Thank you for taking time to visit with me today. Please don't hesitate to contact me if I can be of assistance to you.   Following are the goals we discussed today:   Goals Addressed               This Visit's Progress     Medications/post op pain/dizziness (pt-stated)        Care Coordination Interventions: Reviewed medications with patient and discussed purpose for all medications Collaborated with Dr. Martinique regarding pt's inquiry on Eliquis/Plavix and ASA 81 mg along with symptoms of dizziness since his recent cardiac surgery. Reviewed scheduled/upcoming provider appointments including pending appointments and verified pt has had his AWV with next year's scheduled Discussed plans with patient for ongoing care management follow up and provided patient with direct contact information for care management team Advised patient to discuss all the ongoing above issues along with pain management on his appointment tomorrow's appointment  with provider Assessed social determinant of health barriers Other requested for assistance cleaning his home however declined care-guides sending community resources. Also encourage pt if he decides on outside resources to discuss all issues on the next outreach with the social worker Delana Meyer on the next upcoming appointment          Our next appointment is by telephone on 03/29/2022 at 11:30 AM  Please call the care guide team at [phone] if you need to cancel or reschedule your appointment.   If you are experiencing a Mental Health or [place] or need someone to talk to, please call the Suicide and Crisis Lifeline: 988 call the [place] National Suicide Prevention Lifeline: 5306316520 or TTY: 434-759-5681 TTY 828-603-7378) to talk to a trained counselor call 1-800-273-TALK (toll free, 24 hour hotline)  The patient verbalized understanding of instructions, educational materials, and care plan provided today  and agreed to receive a mailed copy of patient instructions, educational materials, and care plan.   The patient has been provided with contact information for the care management team and has been advised to call with any health related questions or concerns.   Raina Mina, RN Care Management Coordinator [place] Office [phone]

## 2022-03-16 ENCOUNTER — Encounter: Payer: Self-pay | Admitting: Cardiology

## 2022-03-16 ENCOUNTER — Ambulatory Visit: Payer: Medicare HMO | Admitting: Cardiology

## 2022-03-16 VITALS — BP 145/64 | HR 66 | Ht 70.0 in | Wt 230.8 lb

## 2022-03-16 DIAGNOSIS — I480 Paroxysmal atrial fibrillation: Secondary | ICD-10-CM | POA: Diagnosis not present

## 2022-03-16 DIAGNOSIS — I25118 Atherosclerotic heart disease of native coronary artery with other forms of angina pectoris: Secondary | ICD-10-CM | POA: Diagnosis not present

## 2022-03-16 MED ORDER — ALBUTEROL SULFATE HFA 108 (90 BASE) MCG/ACT IN AERS: 2.0000 | INHALATION_SPRAY | Freq: Four times a day (QID) | RESPIRATORY_TRACT | 2 refills | Status: AC | PRN

## 2022-03-19 ENCOUNTER — Telehealth: Payer: Self-pay | Admitting: Cardiovascular Disease

## 2022-03-19 ENCOUNTER — Other Ambulatory Visit: Payer: Self-pay

## 2022-03-19 ENCOUNTER — Ambulatory Visit: Payer: Medicare HMO | Attending: Cardiovascular Disease

## 2022-03-19 DIAGNOSIS — I495 Sick sinus syndrome: Secondary | ICD-10-CM

## 2022-03-19 LAB — CUP PACEART INCLINIC DEVICE CHECK
Battery Remaining Longevity: 103 mo
Battery Voltage: 2.98 V
Brady Statistic AP VP Percent: 14.3 %
Brady Statistic AP VS Percent: 18.63 %
Brady Statistic AS VP Percent: 17.39 %
Brady Statistic AS VS Percent: 49.67 %
Brady Statistic RA Percent Paced: 33.21 %
Brady Statistic RV Percent Paced: 32.27 %
Date Time Interrogation Session: 20230828145550
Implantable Lead Implant Date: 20180604
Implantable Lead Implant Date: 20180604
Implantable Lead Location: 753859
Implantable Lead Location: 753860
Implantable Lead Model: 5076
Implantable Lead Model: 5076
Implantable Pulse Generator Implant Date: 20180604
Lead Channel Impedance Value: 323 Ohm
Lead Channel Impedance Value: 380 Ohm
Lead Channel Impedance Value: 437 Ohm
Lead Channel Impedance Value: 475 Ohm
Lead Channel Pacing Threshold Amplitude: 0.75 V
Lead Channel Pacing Threshold Amplitude: 0.75 V
Lead Channel Pacing Threshold Pulse Width: 0.4 ms
Lead Channel Pacing Threshold Pulse Width: 0.4 ms
Lead Channel Sensing Intrinsic Amplitude: 1.875 mV
Lead Channel Sensing Intrinsic Amplitude: 3.625 mV
Lead Channel Sensing Intrinsic Amplitude: 4.375 mV
Lead Channel Sensing Intrinsic Amplitude: 5.125 mV
Lead Channel Setting Pacing Amplitude: 1.5 V
Lead Channel Setting Pacing Amplitude: 2.5 V
Lead Channel Setting Pacing Pulse Width: 0.4 ms
Lead Channel Setting Sensing Sensitivity: 1.2 mV

## 2022-03-19 NOTE — Patient Instructions (Addendum)
Hold your Eliquis (apixaban) for 3 days, resume evening of 03/23/2022.

## 2022-03-19 NOTE — Telephone Encounter (Signed)
Pt is calling in regards to a fall he had recently getting out of bed. He states when he fell he hit right above where his pacemaker is and he believes bruised around his pacemaker. He would like a call back to make sure this will be okay.

## 2022-03-19 NOTE — Telephone Encounter (Signed)
Spoke with patient, patient stated that area over the incision site is black, patient unable to send transmission, patient agreeable to apt today at 2:00pm to assess incision site and check device.

## 2022-03-19 NOTE — Progress Notes (Signed)
     PPM incision site after fall, per [place] patient to hold Eliquis for 3 days, Resume on evening of 03/23/2022. Pacemake checked, normal device function, lead measurements normal

## 2022-03-20 ENCOUNTER — Telehealth: Payer: Self-pay | Admitting: Pharmacist

## 2022-03-20 NOTE — Chronic Care Management (AMB) (Unsigned)
Chronic Care Management Pharmacy Assistant   Name: [name]  MRN: [id number] DOB: September 11, 1940  Reason for Encounter: Medication Review / Medication Coordination Call   Recent office visits:  None  Recent consult visits:  03/16/2022 Peter Martinique MD (cardiology) - Patient was seen for Coronary artery disease of native artery of native heart with stable angina pectoris and an additional concern. Started Ventolin HFA 2 puffs q 6 hours prn. Discontinued Aspirin. No follow up noted.   03/05/2022 Rudean Haskell MD (cardiology) - Patient was seen for Coronary artery disease of native artery of native heart with stable angina pectoris and additional concerns. Discontinued Apixaban. No follow up noted.   Hospital visits:  Patient was see at [facility] ED on 03/05/2022 (3 hours) due to bruising.    New?Medications Started at [facility] Discharge:?? No medications started Medication Changes at Hospital Discharge: No medications changed Medications Discontinued at Hospital Discharge: No medications discontinued Medications that remain the same after Hospital Discharge:??  -All other medications will remain the same.    Medications: Outpatient Encounter Medications as of 03/20/2022  Medication Sig   acetaminophen (TYLENOL) 500 MG tablet Take 1,000 mg by mouth every 6 (six) hours as needed for moderate pain.   albuterol (VENTOLIN HFA) 108 (90 Base) MCG/ACT inhaler Inhale 2 puffs into the lungs every 6 (six) hours as needed for wheezing or shortness of breath.   apixaban (ELIQUIS) 5 MG TABS tablet Take 1 tablet (5 mg total) by mouth 2 (two) times daily.   Ascorbic Acid (VITAMIN C PO) Take 1-4 tablets by mouth See admin instructions. Take 1 tablet daily, may increase to 3-4 tabs daily as needed for immune support   Carboxymethylcellulose Sod PF (LUBRICANT EYE DROPS PF) 0.5 % SOLN Place 1 drop into both eyes 4 (four) times daily as needed (dry/irritated eyes.).   clopidogrel  (PLAVIX) 75 MG tablet Take 1 tablet (75 mg total) by mouth daily.   diclofenac sodium (VOLTAREN) 1 % GEL Apply 1 application topically daily as needed (ankle pain).   diltiazem (CARDIZEM CD) 120 MG 24 hr capsule TAKE 1 CAPSULE BY MOUTH EVERY DAY   fluticasone (FLONASE) 50 MCG/ACT nasal spray PLACE 1 SPRAY INTO BOTH NOSTRILS DAILY AS NEEDED FOR ALLERGIES OR RHINITIS.   gabapentin (NEURONTIN) 100 MG capsule TAKE 1 CAPSULE BY MOUTH THREE TIMES A DAY (Patient taking differently: Take 100-200 mg by mouth See admin instructions. Take 1 capsule (100 mg) by mouth in the morning & take 2 capsules (200 mg) by mouth at night.)   isosorbide mononitrate (IMDUR) 60 MG 24 hr tablet Take 1.5 tablets (90 mg total) by mouth daily.   ketoconazole (NIZORAL) 2 % cream Apply 1 application topically 2 (two) times daily as needed for irritation.   levothyroxine (SYNTHROID) 175 MCG tablet Take 1 tablet (175 mcg total) by mouth daily before breakfast.   magnesium oxide (MAG-OX) 400 MG tablet Take 400 mg by mouth daily as needed (restless leg).   moxifloxacin (VIGAMOX) 0.5 % ophthalmic solution Place 1 drop into the right eye in the morning and at bedtime.   nitroGLYCERIN (NITROSTAT) 0.4 MG SL tablet Place 1 tablet (0.4 mg total) under the tongue every 5 (five) minutes as needed for chest pain.   [START ON 04/29/2022] oxycodone (ROXICODONE) 30 MG immediate release tablet Take 1 tablet (30 mg total) by mouth every 6 (six) hours as needed for pain.   pantoprazole (PROTONIX) 40 MG tablet Take 1 tablet (40 mg total) by mouth daily.  potassium chloride (KLOR-CON) 10 MEQ tablet Take 20 mEq by mouth daily with breakfast.   predniSONE (DELTASONE) 10 MG tablet Take 5 tabs a day for 3 days, then 4 a day for 3 days, then 3 a day for 3 days, then 2 a day for 3 days, then 1 a day for 3 days, then stop (Patient taking differently: Take 30 mg by mouth daily.)   ranolazine (RANEXA) 500 MG 12 hr tablet Take 500 mg by mouth 2 (two) times daily.    ropinirole (REQUIP) 5 MG tablet Take 1-2 tablets (5-10 mg total) by mouth See admin instructions. Take 5 mg in the morning and 10 mg at night, may take 5 mg up to 6 times a day as needed for restless legs (Patient taking differently: Take 5-10 mg by mouth See admin instructions. Take  1 tablet (5 mg) by mouth in the morning and 2 tablets (10 mg) by mouth at night (scheduled), may take 5 mg up to 6 times a day as needed for restless legs)   torsemide (DEMADEX) 20 MG tablet Take 0.5 tablets (10 mg total) by mouth See admin instructions. Take 10 mg daily, may take a second 10 mg dose an hour later if he doesn't use the bathroom (Patient taking differently: Take 20 mg by mouth every Monday, Wednesday, and Friday.)   valACYclovir (VALTREX) 500 MG tablet Take 500 mg by mouth in the morning.   vitamin B-12 (CYANOCOBALAMIN) 500 MCG tablet Take 500 mcg by mouth daily.   No facility-administered encounter medications on file as of 03/20/2022.   Reviewed chart for medication changes ahead of medication coordination call.  No OVs, Consults, or hospital visits since last care coordination call/Pharmacist visit. (If appropriate, list visit date, provider name)  No medication changes indicated OR if recent visit, treatment plan here.  BP Readings from Last 3 Encounters:  03/16/22 (!) 145/64  03/05/22 (!) 142/85  03/05/22 130/60    Lab Results  Component Value Date   HGBA1C 6.3 11/21/2021     Patient obtains medications through Adherence Packaging  30 Days    Last adherence delivery included: None    Patient declined last month:    Levothyroxine 175 mcg - 1 tablet before breakfast (picked up at [facility] on [facility]/12/2022) Ranolazine ER 500 mg - discontinued at hospital Nitroglycerin 0.4 mg - pt requests don't send til he asks for it.  Gabapentin 100 mg - 1 tablet at breakfast and 2 at bedtime Vitamin B12 500 mcg - 1 tablet at breakfast and bedtime Torsemide 20 mg - 1 tablet at breakfast on Mon, Wed and  Fri Diltiazem 120 mg - 1 tablet at breakfast Apixaban 5 mg - take 1 tablet at breakfast and bedtime Isosorbide 60 mg - Take 1.5 tablets (90 mg total) by mouth daily., Starting Thu 03/01/2022 (on hold at [facility] - not filled yet) Klor-Con 10 meq - 2 tablets at breakfast Ropinirole 5 mg - 2 tablets at breakfast, lunch and at bedtime (vial)   Patient is due for next adherence delivery on: 03/30/2022   Called patient and reviewed medications and coordinated delivery.   This delivery to include: No delivery this month, patient states he just had cardiac surgery, cardiologist has had him to stop all his medications for now. He is currently taking what the cardiologist has him on, filled at the hospital. He has a follow up visit with Dr. Peter Martinique on 03/16/2022. He will discuss with him at that time what medications to continue  and what will change.     Patient will need a short fill: No short fill needed   Coordinated acute fill: No acute fill needed   Patient declined the following medications:  Gabapentin 100 mg 1 tablet at breakfast and 2 tablets at bedtime Vitamin B12 500 mcg 1 tablet at breakfast and bedtime Torsemide 20 mg 1 tablet at breakfast on M/W/F Diltiazem 120 mg 1 tablet at breakfast Eliquis 5 mg 1 tablet at breakfast and bedtime Isosorbide ER 60 mg 1.5 tablets at breakfast (Spoke with [facility] Mercury at [facility] and this last fill date of 03/06/2022 was returned to stock and pt did not pick up) Klor con 10 meq 2 tablets at breakfast Ropinirole 5 mg 2 tablet three times daily (vial)   Confirmed delivery date of 03/30/2022 advised patient that pharmacy will contact them the morning of delivery   Care Gaps: AWV - scheduled 08/31/2022  Last BP - 145/64 on 03/16/2022 Last A1C - 6.3 on 11/21/2021 Foot exam - never done Eye exam - never done Malb - never done Shingrix - never done Flu - done  Star Rating Drugs: None  [place] Pharmacist Assistant [phone]

## 2022-03-21 ENCOUNTER — Encounter: Payer: Self-pay | Admitting: Licensed Clinical Social Worker

## 2022-03-21 ENCOUNTER — Encounter: Payer: Self-pay | Admitting: Family

## 2022-03-21 ENCOUNTER — Ambulatory Visit (INDEPENDENT_AMBULATORY_CARE_PROVIDER_SITE_OTHER): Payer: Medicare HMO | Admitting: Family

## 2022-03-21 ENCOUNTER — Ambulatory Visit (HOSPITAL_BASED_OUTPATIENT_CLINIC_OR_DEPARTMENT_OTHER)
Admission: RE | Admit: 2022-03-21 | Discharge: 2022-03-21 | Disposition: A | Discharge status: Home / Self Care | Payer: Medicare HMO | Source: Ambulatory Visit | Attending: Family | Admitting: Family

## 2022-03-21 VITALS — BP 128/75 | HR 72 | Temp 98.1°F | Ht 70.0 in | Wt 224.1 lb

## 2022-03-21 DIAGNOSIS — M79672 Pain in left foot: Secondary | ICD-10-CM | POA: Diagnosis not present

## 2022-03-21 DIAGNOSIS — M7989 Other specified soft tissue disorders: Secondary | ICD-10-CM | POA: Diagnosis not present

## 2022-03-21 NOTE — Patient Instructions (Signed)
It was very nice to see you today!   Go over to our [facility] on [address] to the radiology department for an xray of your left foot. I will send a message thru MyChart with the results. Continue to ice your foot for ONLY up to 30 minutes at a time 3 times per day. Elevate your foot above heart level if possible whenever resting.      PLEASE NOTE:  If you had any lab tests please let us know if you have not heard back within a few days. You may see your results on MyChart before we have a chance to review them but we will give you a call once they are reviewed by [place]. If we ordered any referrals today, please let us know if you have not heard from their office within the next week.

## 2022-03-21 NOTE — Progress Notes (Signed)
Patient ID: [name], male    DOB: [birth year], 81 y.o.   MRN: [id number]  Chief Complaint  Patient presents with   Fall    Pt c/o falling out of bed on Saturday morning and hurt his left foot, left knee and left side of face. Pt also c/o pace maker area being extremely bruised and painful. Tried ice which didn't help. Pt is not able to walk much on left foot. Left foot is bruised, purple. Seen Dr Rosario Adie on Monday, Pace maker is fine but if area is infected he has to go back.     HPI: Left foot injury:  pt fell out of bed Sunday early morning and fell on his left side, has bruised left upper chest where his pacemaker is, left knee with soreness and left foot is most painful with swelling and bruising in big toe and whole foot swollen. He reports having his pacemaker checked via remote and told it was functioning properly, but told to hold his Eliquis until Friday. Pt applied ice for over an hour during the day to his foot after it happened and it helped the pain but not the swelling.   Assessment & Plan:  1. Left foot pain left foot with 2-3+ swelling, mild erythema & bruising in great toe area. Sending for xray, advised pt to elevate foot whenever resting, apply ice up to 46mn tid, pt already taking OXY, Gabapentin qd, can add Tylenol extra strength tid.    - DG Foot Complete Left; Future    Subjective:    Outpatient Medications Prior to Visit  Medication Sig Dispense Refill   acetaminophen (TYLENOL) 500 MG tablet Take 1,000 mg by mouth every 6 (six) hours as needed for moderate pain.     albuterol (VENTOLIN HFA) 108 (90 Base) MCG/ACT inhaler Inhale 2 puffs into the lungs every 6 (six) hours as needed for wheezing or shortness of breath. 8 g 2   apixaban (ELIQUIS) 5 MG TABS tablet Take 1 tablet (5 mg total) by mouth 2 (two) times daily. 60 tablet    Ascorbic Acid (VITAMIN C PO) Take 1-4 tablets by mouth See admin instructions. Take 1 tablet daily, may increase to 3-4 tabs daily  as needed for immune support     Carboxymethylcellulose Sod PF (LUBRICANT EYE DROPS PF) 0.5 % SOLN Place 1 drop into both eyes 4 (four) times daily as needed (dry/irritated eyes.).     clopidogrel (PLAVIX) 75 MG tablet Take 1 tablet (75 mg total) by mouth daily. 90 tablet 3   diclofenac sodium (VOLTAREN) 1 % GEL Apply 1 application topically daily as needed (ankle pain). 5 Tube 10   diltiazem (CARDIZEM CD) 120 MG 24 hr capsule TAKE 1 CAPSULE BY MOUTH EVERY DAY 90 capsule 1   fluticasone (FLONASE) 50 MCG/ACT nasal spray PLACE 1 SPRAY INTO BOTH NOSTRILS DAILY AS NEEDED FOR ALLERGIES OR RHINITIS. 48 mL 1   gabapentin (NEURONTIN) 100 MG capsule TAKE 1 CAPSULE BY MOUTH THREE TIMES A DAY (Patient taking differently: Take 100-200 mg by mouth See admin instructions. Take 1 capsule (100 mg) by mouth in the morning & take 2 capsules (200 mg) by mouth at night.) 90 capsule 1   isosorbide mononitrate (IMDUR) 60 MG 24 hr tablet Take 1.5 tablets (90 mg total) by mouth daily. 90 tablet 1   ketoconazole (NIZORAL) 2 % cream Apply 1 application topically 2 (two) times daily as needed for irritation. 30 g 5   levothyroxine (SYNTHROID) 175 MCG  tablet Take 1 tablet (175 mcg total) by mouth daily before breakfast. 90 tablet 1   magnesium oxide (MAG-OX) 400 MG tablet Take 400 mg by mouth daily as needed (restless leg).     moxifloxacin (VIGAMOX) 0.5 % ophthalmic solution Place 1 drop into the right eye in the morning and at bedtime.     nitroGLYCERIN (NITROSTAT) 0.4 MG SL tablet Place 1 tablet (0.4 mg total) under the tongue every 5 (five) minutes as needed for chest pain. 25 tablet 1   [START ON 04/29/2022] oxycodone (ROXICODONE) 30 MG immediate release tablet Take 1 tablet (30 mg total) by mouth every 6 (six) hours as needed for pain. 120 tablet 0   pantoprazole (PROTONIX) 40 MG tablet Take 1 tablet (40 mg total) by mouth daily. 30 tablet 2   potassium chloride (KLOR-CON) 10 MEQ tablet Take 20 mEq by mouth daily with  breakfast.     predniSONE (DELTASONE) 10 MG tablet Take 5 tabs a day for 3 days, then 4 a day for 3 days, then 3 a day for 3 days, then 2 a day for 3 days, then 1 a day for 3 days, then stop (Patient taking differently: Take 30 mg by mouth daily.) 45 tablet 0   ranolazine (RANEXA) 500 MG 12 hr tablet Take 500 mg by mouth 2 (two) times daily.     ropinirole (REQUIP) 5 MG tablet Take 1-2 tablets (5-10 mg total) by mouth See admin instructions. Take 5 mg in the morning and 10 mg at night, may take 5 mg up to 6 times a day as needed for restless legs (Patient taking differently: Take 5-10 mg by mouth See admin instructions. Take  1 tablet (5 mg) by mouth in the morning and 2 tablets (10 mg) by mouth at night (scheduled), may take 5 mg up to 6 times a day as needed for restless legs)     torsemide (DEMADEX) 20 MG tablet Take 0.5 tablets (10 mg total) by mouth See admin instructions. Take 10 mg daily, may take a second 10 mg dose an hour later if he doesn't use the bathroom (Patient taking differently: Take 20 mg by mouth every Monday, Wednesday, and Friday.)     valACYclovir (VALTREX) 500 MG tablet Take 500 mg by mouth in the morning.     vitamin B-12 (CYANOCOBALAMIN) 500 MCG tablet Take 500 mcg by mouth daily.     No facility-administered medications prior to visit.   Past Medical History:  Diagnosis Date   Adenomatous polyp of colon 2007   Arthritis    Atrial flutter ([place])    AVM (arteriovenous malformation) 2011   a. S/p argon plasma coagulation and ablation in 2011, 2017.   Bradycardia    a. H/o almost 7sec pause nocturnally during 2011 admission. Also has h/o fatigue with BB.   CAD (coronary artery disease)    a. Nonobst disease by cath 2009. b. s/p PCI 10/2014 with DES to [place], patent by relook 11/2014 (Brilinta changed to Plavix with improved sx).   Chronic diastolic CHF (congestive heart failure) (HCC)    Depression    Diverticulosis    Gastritis 2011   GERD (gastroesophageal reflux  disease)    History of hiatal hernia    Hyperlipidemia    Hypertension    Hypothyroidism    Myocardial infarction ([place])    Obstructive sleep apnea    -no cpap use   Pre-diabetes    Premature atrial contractions Holter 2016   Presence  of permanent cardiac pacemaker    PVC's (premature ventricular contractions) Holter 2016   Statin intolerance    Transfusion history    several years ago -GI bleed   Past Surgical History:  Procedure Laterality Date   ANKLE SURGERY Left    APPENDECTOMY     CARDIAC CATHETERIZATION N/A 11/26/2014   Procedure: Left Heart Cath and Coronary Angiography;  Surgeon: Burnell Blanks, MD;  Location: [facility];  Service: Cardiovascular;  Laterality: N/A;   CHOLECYSTECTOMY N/A 08/23/2016   Procedure: LAPAROSCOPIC CHOLECYSTECTOMY;  Surgeon: Coralie Keens, MD;  Location: [facility];  Service: General;  Laterality: N/A;   COLONOSCOPY  08-23-05   per Dr. Deatra Ina, adenomatous polyps, repeat in 5 yrs    COLONOSCOPY WITH PROPOFOL N/A 12/06/2015   Procedure: COLONOSCOPY WITH PROPOFOL;  Surgeon: Doran Stabler, MD;  Location: [facility];  Service: Gastroenterology;  Laterality: N/A;   CORONARY ANGIOGRAPHY N/A 02/28/2022   Procedure: CORONARY ANGIOGRAPHY;  Surgeon: Martinique, Peter M, MD;  Location: [facility];  Service: Cardiovascular;  Laterality: N/A;   CORONARY CTO INTERVENTION N/A 02/28/2022   Procedure: CORONARY CTO INTERVENTION;  Surgeon: Martinique, Peter M, MD;  Location: [facility];  Service: Cardiovascular;  Laterality: N/A;   CORONARY STENT INTERVENTION N/A 01/25/2022   Procedure: CORONARY STENT INTERVENTION;  Surgeon: Burnell Blanks, MD;  Location: [facility];  Service: Cardiovascular;  Laterality: N/A;   ENTEROSCOPY N/A 12/06/2015   Procedure: ENTEROSCOPY;  Surgeon: Doran Stabler, MD;  Location: [facility];  Service: Gastroenterology;  Laterality: N/A;   ESOPHAGOGASTRODUODENOSCOPY  08-23-05   per Dr. Deatra Ina, cauterized  jejunal AVMs    [place] N/A 12/06/2015   Procedure: HOT HEMOSTASIS (ARGON PLASMA COAGULATION/BICAP);  Surgeon: Doran Stabler, MD;  Location: [facility];  Service: Gastroenterology;  Laterality: N/A;   INGUINAL HERNIA REPAIR Left 06/29/2021   Procedure: LAPAROSCOPIC LEFT INGUINAL HERNIA REPAIR WITH MESH;  Surgeon: Ralene Ok, MD;  Location: [facility];  Service: General;  Laterality: Left;   INTRAVASCULAR ULTRASOUND/IVUS N/A 02/28/2022   Procedure: Intravascular Ultrasound/IVUS;  Surgeon: Martinique, Peter M, MD;  Location: [facility];  Service: Cardiovascular;  Laterality: N/A;   LEFT HEART CATHETERIZATION WITH CORONARY ANGIOGRAM N/A 09/08/2012   Procedure: LEFT HEART CATHETERIZATION WITH CORONARY ANGIOGRAM;  Surgeon: Burnell Blanks, MD;  [facility];  Service: Cardiovascular;  Laterality: N/A;   LEFT HEART CATHETERIZATION WITH CORONARY ANGIOGRAM N/A 11/16/2014   Procedure: LEFT HEART CATHETERIZATION WITH CORONARY ANGIOGRAM;  Surgeon: Leonie Man, MD;  Location: [facility] C[facility] LAB;  Service: Cardiovascular;  Laterality: N/A;   PACEMAKER IMPLANT N/A 12/24/2016   Procedure: Pacemaker Implant;  Surgeon: Deboraha Sprang, MD;  Location: [facility];  Service: Cardiovascular;  Laterality: N/A;   RIGHT/LEFT HEART CATH AND CORONARY ANGIOGRAPHY N/A 01/25/2022   Procedure: RIGHT/LEFT HEART CATH AND CORONARY ANGIOGRAPHY;  Surgeon: Burnell Blanks, MD;  Location: [facility];  Service: Cardiovascular;  Laterality: N/A;   SKIN GRAFT Right    leg   TONSILLECTOMY     UMBILICAL HERNIA REPAIR N/A 06/29/2021   Procedure: OPEN UMBILICAL HERNIA REPAIR WITH MESH;  Surgeon: Ralene Ok, MD;  Location: [facility];  Service: General;  Laterality: N/A;   Allergies  Allergen Reactions   Brilinta [Ticagrelor] Shortness Of Breath   Colchicine     Syncope-causes patient to pass out   Metoprolol Tartrate Other (See Comments)  Severe chest pains " flat  lined patient", chest pain, Tachyarrhythmia   Mirapex [Pramipexole Dihydrochloride]     Severe leg pain   Shellfish Allergy Anaphylaxis and Hives   Statins Other (See Comments)    All statins cause myalgias    Zolpidem Other (See Comments)    Chest pain   Coconut (Cocos Nucifera) Hives   Lasix [Furosemide] Hives and Itching   Levaquin [Levofloxacin] Hives   Trazodone And Nefazodone Other (See Comments)    Unsteady on feet      Objective:    Physical Exam Vitals and nursing note reviewed.  Constitutional:      General: He is not in acute distress.    Appearance: Normal appearance.  HENT:     Head: Normocephalic.  Cardiovascular:     Rate and Rhythm: Normal rate and regular rhythm.  Pulmonary:     Effort: Pulmonary effort is normal.     Breath sounds: Normal breath sounds.  Musculoskeletal:     Cervical back: Normal range of motion.     Right foot: Swelling present.     Left foot: Decreased range of motion. Swelling (with ecchymosis in great toe and proximally about 2 inches) and tenderness present.  Skin:    General: Skin is warm and dry.     Findings: Ecchymosis (left upper chest over pacemaker, no edema or erythema noted) present.  Neurological:     Mental Status: He is alert and oriented to person, place, and time.  Psychiatric:        Mood and Affect: Mood normal.    BP 128/75 (BP Location: Left Arm, Patient Position: Sitting, Cuff Size: Large)   Pulse 72   Temp 98.1 F (36.7 C) (Temporal)   Ht '5\' 10"'$  (1.778 m)   Wt 224 lb 2 oz (101.7 kg)   SpO2 92%   BMI 32.16 kg/m  Wt Readings from Last 3 Encounters:  03/21/22 224 lb 2 oz (101.7 kg)  03/16/22 230 lb 12.8 oz (104.7 kg)  03/05/22 222 lb (100.7 kg)       Jeanie Sewer, NP

## 2022-03-21 NOTE — Progress Notes (Signed)
Great news! Your foot is not broken. But may take a few weeks to feel better. Continue to elevate when resting and applying ice as needed until swelling decreases to your baseline!

## 2022-03-22 ENCOUNTER — Telehealth: Payer: Self-pay | Admitting: Family Medicine

## 2022-03-22 NOTE — Telephone Encounter (Signed)
Please let pt know results message sent yesterday, thanks.

## 2022-03-22 NOTE — Telephone Encounter (Signed)
Spoke with patient about message.  Informed patient that  Dr. Sarajane Jews is out the office this week, message was forward to [facility] ,will receive a call from [facility] to discuss message.    Voiced understanding.

## 2022-03-22 NOTE — Telephone Encounter (Signed)
Pt was seen at [facility] on 03/21/22.  Pt saw S. Hudnell, NP  Pt would like to know what his plan of care after the visit should consist of?  Pt states he was not told if he should elevate the leg, wear a boot, warm or cold compresses,  etc....  He would like a call back to discuss xray results and plan of care moving forward.  Pt would like a call back today.  Please advise. [phone]

## 2022-03-23 NOTE — Telephone Encounter (Signed)
I called pt yesterday, pt gave a verbalized understanding.

## 2022-03-26 ENCOUNTER — Other Ambulatory Visit: Payer: Self-pay

## 2022-03-26 ENCOUNTER — Encounter (HOSPITAL_BASED_OUTPATIENT_CLINIC_OR_DEPARTMENT_OTHER): Payer: Self-pay

## 2022-03-26 ENCOUNTER — Emergency Department (HOSPITAL_BASED_OUTPATIENT_CLINIC_OR_DEPARTMENT_OTHER)
Admission: EM | Admit: 2022-03-26 | Discharge: 2022-03-26 | Disposition: A | Discharge status: Home / Self Care | Payer: Medicare HMO | Attending: Emergency Medicine | Admitting: Emergency Medicine

## 2022-03-26 ENCOUNTER — Emergency Department (HOSPITAL_BASED_OUTPATIENT_CLINIC_OR_DEPARTMENT_OTHER): Payer: Medicare HMO | Admitting: Radiology

## 2022-03-26 ENCOUNTER — Emergency Department (HOSPITAL_BASED_OUTPATIENT_CLINIC_OR_DEPARTMENT_OTHER): Payer: Medicare HMO

## 2022-03-26 DIAGNOSIS — R0602 Shortness of breath: Secondary | ICD-10-CM | POA: Diagnosis not present

## 2022-03-26 DIAGNOSIS — I2510 Atherosclerotic heart disease of native coronary artery without angina pectoris: Secondary | ICD-10-CM | POA: Insufficient documentation

## 2022-03-26 DIAGNOSIS — E039 Hypothyroidism, unspecified: Secondary | ICD-10-CM | POA: Diagnosis not present

## 2022-03-26 DIAGNOSIS — I110 Hypertensive heart disease with heart failure: Secondary | ICD-10-CM | POA: Insufficient documentation

## 2022-03-26 DIAGNOSIS — Z7901 Long term (current) use of anticoagulants: Secondary | ICD-10-CM | POA: Insufficient documentation

## 2022-03-26 DIAGNOSIS — Z79899 Other long term (current) drug therapy: Secondary | ICD-10-CM | POA: Insufficient documentation

## 2022-03-26 DIAGNOSIS — M7989 Other specified soft tissue disorders: Secondary | ICD-10-CM | POA: Diagnosis not present

## 2022-03-26 DIAGNOSIS — L03116 Cellulitis of left lower limb: Secondary | ICD-10-CM | POA: Diagnosis not present

## 2022-03-26 DIAGNOSIS — I5032 Chronic diastolic (congestive) heart failure: Secondary | ICD-10-CM | POA: Insufficient documentation

## 2022-03-26 DIAGNOSIS — R0902 Hypoxemia: Secondary | ICD-10-CM | POA: Diagnosis not present

## 2022-03-26 DIAGNOSIS — W06XXXA Fall from bed, initial encounter: Secondary | ICD-10-CM | POA: Insufficient documentation

## 2022-03-26 DIAGNOSIS — Z950 Presence of cardiac pacemaker: Secondary | ICD-10-CM | POA: Insufficient documentation

## 2022-03-26 LAB — CBC WITH DIFFERENTIAL/PLATELET
Abs Immature Granulocytes: 0.13 10*3/uL — ABNORMAL HIGH (ref 0.00–0.07)
Basophils Absolute: 0.1 10*3/uL (ref 0.0–0.1)
Basophils Relative: 1 %
Eosinophils Absolute: 0.3 10*3/uL (ref 0.0–0.5)
Eosinophils Relative: 3 %
HCT: 37.1 % — ABNORMAL LOW (ref 39.0–52.0)
Hemoglobin: 11.9 g/dL — ABNORMAL LOW (ref 13.0–17.0)
Immature Granulocytes: 1 %
Lymphocytes Relative: 22 %
Lymphs Abs: 2.2 10*3/uL (ref 0.7–4.0)
MCH: 29.8 pg (ref 26.0–34.0)
MCHC: 32.1 g/dL (ref 30.0–36.0)
MCV: 93 fL (ref 80.0–100.0)
Monocytes Absolute: 1 10*3/uL (ref 0.1–1.0)
Monocytes Relative: 10 %
Neutro Abs: 6.3 10*3/uL (ref 1.7–7.7)
Neutrophils Relative %: 63 %
Platelets: 250 10*3/uL (ref 150–400)
RBC: 3.99 MIL/uL — ABNORMAL LOW (ref 4.22–5.81)
RDW: 15.9 % — ABNORMAL HIGH (ref 11.5–15.5)
WBC: 10 10*3/uL (ref 4.0–10.5)
nRBC: 0 % (ref 0.0–0.2)

## 2022-03-26 LAB — COMPREHENSIVE METABOLIC PANEL
ALT: 14 U/L (ref 0–44)
AST: 26 U/L (ref 15–41)
Albumin: 4 g/dL (ref 3.5–5.0)
Alkaline Phosphatase: 57 U/L (ref 38–126)
Anion gap: 7 (ref 5–15)
BUN: 14 mg/dL (ref 8–23)
CO2: 29 mmol/L (ref 22–32)
Calcium: 8.8 mg/dL — ABNORMAL LOW (ref 8.9–10.3)
Chloride: 98 mmol/L (ref 98–111)
Creatinine, Ser: 1 mg/dL (ref 0.61–1.24)
GFR, Estimated: >60 mL/min (ref >60)
Glucose, Bld: 145 mg/dL — ABNORMAL HIGH (ref 70–99)
Potassium: 4.8 mmol/L (ref 3.5–5.1)
Sodium: 134 mmol/L — ABNORMAL LOW (ref 135–145)
Total Bilirubin: 1 mg/dL (ref 0.3–1.2)
Total Protein: 7.1 g/dL (ref 6.5–8.1)

## 2022-03-26 LAB — BRAIN NATRIURETIC PEPTIDE: B Natriuretic Peptide: 120 pg/mL — ABNORMAL HIGH (ref 0.0–100.0)

## 2022-03-26 MED ORDER — DOXYCYCLINE HYCLATE 100 MG PO CAPS: 100.0000 mg | ORAL_CAPSULE | Freq: Two times a day (BID) | ORAL | 0 refills | Status: AC

## 2022-03-26 NOTE — ED Triage Notes (Signed)
Pt arrives POV with ongoing swelling and redness to his LLE. Pt seen here 2 weeks ago after falling out of the bed hurting his Left Great toe. Pt is concerned for an infection

## 2022-03-26 NOTE — ED Provider Notes (Signed)
[facility]ERG[facility] DEPT Provider Note   CSN: [id number] Arrival date & time: 03/26/22  1036     History  Chief Complaint  Patient presents with   Leg Swelling    [name] is a 81 y.o. male.  HPI    81 year old male with a history of coronary artery disease, atrial flutter on Eliquis, AVM, chronic diastolic heart failure, hypertension, hyperlipidemia, OSA, who presents with concern for left lower extremity redness and swelling.  Reports this began about 10 days ago when [name] had fallen out of bed.  Reports that [name] was seen and had an x-ray done of his foot as an outpatient which did not show any sign of acute fracture.  Reports [name] has had continued pain and swelling and now with worsening redness is and worried that the area is infected.  [name] has had no known fevers, nausea, vomiting, or other concerns.  Denies chest pain.  Reports that [name] has chronic shortness of breath.  No cough, black or bloody stools, diarrhea. Past Medical History:  Diagnosis Date   Adenomatous polyp of colon 2007   Arthritis    Atrial flutter ([facility])    AVM (arteriovenous malformation) 2011   a. S/p argon plasma coagulation and ablation in 2011, 2017.   Bradycardia    a. H/o almost 7sec pause nocturnally during 2011 admission. Also has h/o fatigue with BB.   CAD (coronary artery disease)    a. Nonobst disease by cath 2009. b. s/p PCI 10/2014 with DES to [place], patent by relook 11/2014 (Brilinta changed to Plavix with improved sx).   Chronic diastolic CHF (congestive heart failure) ([place])    Depression    Diverticulosis    Gastritis 2011   GERD (gastroesophageal reflux disease)    History of hiatal hernia    Hyperlipidemia    Hypertension    Hypothyroidism    Myocardial infarction ([place])    Obstructive sleep apnea    -no cpap use   Pre-diabetes    Premature atrial contractions Holter 2016   Presence of permanent cardiac pacemaker    PVC's (premature ventricular contractions)  Holter 2016   Statin intolerance    Transfusion history    several years ago -GI bleed     Home Medications Prior to Admission medications   Medication Sig Start Date End Date Taking? Authorizing Provider  doxycycline (VIBRAMYCIN) 100 MG capsule Take 1 capsule (100 mg total) by mouth 2 (two) times daily for 10 days. 03/26/22 04/05/22 Yes Gareth Morgan, MD  acetaminophen (TYLENOL) 500 MG tablet Take 1,000 mg by mouth every 6 (six) hours as needed for moderate pain.    [provider]  albuterol (VENTOLIN HFA) 108 (90 Base) MCG/ACT inhaler Inhale 2 puffs into the lungs every 6 (six) hours as needed for wheezing or shortness of breath. 03/16/22   Martinique, Peter M, MD  apixaban (ELIQUIS) 5 MG TABS tablet Take 1 tablet (5 mg total) by mouth 2 (two) times daily. 03/19/22   Martinique, Peter M, MD  Ascorbic Acid (VITAMIN C PO) Take 1-4 tablets by mouth See admin instructions. Take 1 tablet daily, may increase to 3-4 tabs daily as needed for immune support    [provider]  Carboxymethylcellulose Sod PF (LUBRICANT EYE DROPS PF) 0.5 % SOLN Place 1 drop into both eyes 4 (four) times daily as needed (dry/irritated eyes.).    [provider]  clopidogrel (PLAVIX) 75 MG tablet Take 1 tablet (75 mg total) by mouth daily.  02/23/22   Martinique, Peter M, MD  diclofenac sodium (VOLTAREN) 1 % GEL Apply 1 application topically daily as needed (ankle pain). 11/27/14   Barrett, Evelene Croon, PA-C  diltiazem (CARDIZEM CD) 120 MG 24 hr capsule TAKE 1 CAPSULE BY MOUTH EVERY DAY 03/01/22   Reino Bellis B, NP  fluticasone (FLONASE) 50 MCG/ACT nasal spray PLACE 1 SPRAY INTO BOTH NOSTRILS DAILY AS NEEDED FOR ALLERGIES OR RHINITIS. 02/22/22   [name], MD  gabapentin (NEURONTIN) 100 MG capsule TAKE 1 CAPSULE BY MOUTH THREE TIMES A DAY Patient taking differently: Take 100-200 mg by mouth See admin instructions. Take 1 capsule (100 mg) by mouth in the morning & take 2 capsules (200 mg) by mouth at night.  11/28/21   [name], MD  isosorbide mononitrate (IMDUR) 60 MG 24 hr tablet Take 1.5 tablets (90 mg total) by mouth daily. 03/01/22   Cheryln Manly, NP  ketoconazole (NIZORAL) 2 % cream Apply 1 application topically 2 (two) times daily as needed for irritation. 08/07/21   [name], MD  levothyroxine (SYNTHROID) 175 MCG tablet Take 1 tablet (175 mcg total) by mouth daily before breakfast. 02/12/22   Richardson Dopp T, PA-C  magnesium oxide (MAG-OX) 400 MG tablet Take 400 mg by mouth daily as needed (restless leg).    [provider]  moxifloxacin (VIGAMOX) 0.5 % ophthalmic solution Place 1 drop into the right eye in the morning and at bedtime.    [provider]  nitroGLYCERIN (NITROSTAT) 0.4 MG SL tablet Place 1 tablet (0.4 mg total) under the tongue every 5 (five) minutes as needed for chest pain. 03/01/22   Cheryln Manly, NP  oxycodone (ROXICODONE) 30 MG immediate release tablet Take 1 tablet (30 mg total) by mouth every 6 (six) hours as needed for pain. 04/29/22 05/29/22  [name], MD  pantoprazole (PROTONIX) 40 MG tablet Take 1 tablet (40 mg total) by mouth daily. 03/01/22   Cheryln Manly, NP  potassium chloride (KLOR-CON) 10 MEQ tablet Take 20 mEq by mouth daily with breakfast. 02/05/22   [provider]  predniSONE (DELTASONE) 10 MG tablet Take 5 tabs a day for 3 days, then 4 a day for 3 days, then 3 a day for 3 days, then 2 a day for 3 days, then 1 a day for 3 days, then stop Patient taking differently: Take 30 mg by mouth daily. 02/09/22   [name], MD  ranolazine (RANEXA) 500 MG 12 hr tablet Take 500 mg by mouth 2 (two) times daily. 03/08/22   [provider]  ropinirole (REQUIP) 5 MG tablet Take 1-2 tablets (5-10 mg total) by mouth See admin instructions. Take 5 mg in the morning and 10 mg at night, may take 5 mg up to 6 times a day as needed for restless legs Patient taking differently: Take 5-10 mg by mouth See admin instructions.  Take  1 tablet (5 mg) by mouth in the morning and 2 tablets (10 mg) by mouth at night (scheduled), may take 5 mg up to 6 times a day as needed for restless legs 01/26/22   Darreld Mclean, PA-C  torsemide (DEMADEX) 20 MG tablet Take 0.5 tablets (10 mg total) by mouth See admin instructions. Take 10 mg daily, may take a second 10 mg dose an hour later if [name] doesn't use the bathroom Patient taking differently: Take 20 mg by mouth every Monday, Wednesday, and Friday. 01/26/22   Darreld Mclean,  PA-C  valACYclovir (VALTREX) 500 MG tablet Take 500 mg by mouth in the morning. 02/25/22   [provider]  vitamin B-12 (CYANOCOBALAMIN) 500 MCG tablet Take 500 mcg by mouth daily.    [provider]      Allergies    Brilinta [ticagrelor], Colchicine, Metoprolol tartrate, Mirapex [pramipexole dihydrochloride], Shellfish allergy, Statins, Zolpidem, Coconut (cocos nucifera), Lasix [furosemide], Levaquin [levofloxacin], and Trazodone and nefazodone    Review of Systems   Review of Systems  Physical Exam Updated Vital Signs BP 130/74 (BP Location: Right Arm)   Pulse 70   Temp 98.1 F (36.7 C) (Oral)   Resp 18   SpO2 96%  Physical Exam Vitals and nursing note reviewed.  Constitutional:      General: [name] is not in acute distress.    Appearance: [name] is well-developed. [name] is not diaphoretic.  HENT:     Head: Normocephalic and atraumatic.  Eyes:     Conjunctiva/sclera: Conjunctivae normal.  Cardiovascular:     Rate and Rhythm: Normal rate and regular rhythm.     Heart sounds: Normal heart sounds. No murmur heard.    No friction rub. No gallop.  Pulmonary:     Effort: Pulmonary effort is normal. No respiratory distress.     Breath sounds: Normal breath sounds. No wheezing or rales.  Abdominal:     General: There is no distension.     Palpations: Abdomen is soft.     Tenderness: There is no abdominal tenderness. There is no guarding.  Musculoskeletal:     Cervical back: Normal  range of motion.     Left lower leg: Edema present.  Skin:    General: Skin is warm and dry.     Findings: Erythema (LLE) present.  Neurological:     Mental Status: [name] is alert and oriented to person, place, and time.     ED Results / Procedures / Treatments   Labs (all labs ordered are listed, but only abnormal results are displayed) Labs Reviewed  CBC WITH DIFFERENTIAL/PLATELET - Abnormal; Notable for the following components:      Result Value   RBC 3.99 (*)    Hemoglobin 11.9 (*)    HCT 37.1 (*)    RDW 15.9 (*)    Abs Immature Granulocytes 0.13 (*)    All other components within normal limits  COMPREHENSIVE METABOLIC PANEL - Abnormal; Notable for the following components:   Sodium 134 (*)    Glucose, Bld 145 (*)    Calcium 8.8 (*)    All other components within normal limits  BRAIN NATRIURETIC PEPTIDE - Abnormal; Notable for the following components:   B Natriuretic Peptide 120.0 (*)    All other components within normal limits    EKG None  Radiology DG Chest 2 View  Result Date: 03/26/2022 CLINICAL DATA:  Decreased oxygen saturation. EXAM: CHEST - 2 VIEW COMPARISON:  12/25/2020. FINDINGS: Cardiac silhouette is normal in size. Stable left anterior chest wall sequential pacemaker. No mediastinal or hilar masses. No evidence of adenopathy. Clear lungs.  No pleural effusion or pneumothorax. Skeletal structures are intact. IMPRESSION: No active cardiopulmonary disease. Electronically Signed   By: Lajean Manes M.D.   On: 03/26/2022 14:25   US Venous Img Lower Unilateral Left  Result Date: 03/26/2022 CLINICAL DATA:  r/o blood clot, left calf, foot, and ankle swelling for 3 weeks EXAM: LEFT LOWER EXTREMITY VENOUS DOPPLER ULTRASOUND TECHNIQUE: Gray-scale sonography with compression, as well as color and duplex ultrasound, were performed  to evaluate the deep venous system(s) from the level of the common femoral vein through the popliteal and proximal calf veins. COMPARISON:  None  Available. FINDINGS: VENOUS Normal compressibility of the common femoral, superficial femoral, and popliteal veins, as well as the posterior tibial vein. The peroneal vein is not well visualized. Visualized portions of profunda femoral vein and great saphenous vein unremarkable. No filling defects to suggest DVT on grayscale or color Doppler imaging. Doppler waveforms show normal direction of venous flow, normal respiratory plasticity and response to augmentation. Limited views of the contralateral common femoral vein are unremarkable. OTHER There is subcutaneous soft tissue swelling noted. Limitations: none IMPRESSION: No evidence of DVT in the left lower extremity. Subcutaneous soft tissue swelling noted. Electronically Signed   By: Maurine Simmering M.D.   On: 03/26/2022 11:59    Procedures Procedures    Medications Ordered in ED Medications - No data to display  ED Course/ Medical Decision Making/ A&P                            81 year old male with a history of coronary artery disease, atrial flutter on Eliquis, AVM, chronic diastolic heart failure, hypertension, hyperlipidemia, OSA, who presents with concern for left lower extremity redness and swelling.     Reviewed the x-ray of his left foot that had been completed as an outpatient personally and see no sign of fracture or dislocation.  [name] has palpable pulses bilaterally, no sign of acute arterial thrombus.  DVT study was completed and shows no evidence of DVT.  While [name] reported no significant change in his shortness of breath, chest x-ray was performed in the setting of leg swelling and dyspnea which did not show evidence of acute CHF.  BNP elevated in comparison to prior, but not severely elevated.  CBC does show anemia with a hemoglobin of 11.9, which is mildly decreased from August 10 to 12 0.3, recommend repeat given previous lead higher hemoglobin.  No significant leukocytosis.  No clinically significant electrolyte  abnormalities.  Does have lower extremity erythema and pain.  No sign of sepsis.  Will treat for cellulitis with antibiotics.  Given prescription for same and discussed with return precautions.  Recommend for outpatient follow-up.         Final Clinical Impression(s) / ED Diagnoses Final diagnoses:  Left leg swelling  Cellulitis of left lower extremity    Rx / DC Orders ED Discharge Orders          Ordered    doxycycline (VIBRAMYCIN) 100 MG capsule  2 times daily        03/26/22 1522              Gareth Morgan, MD 03/27/22 2332

## 2022-03-28 ENCOUNTER — Encounter (HOSPITAL_COMMUNITY): Payer: Self-pay

## 2022-03-28 ENCOUNTER — Ambulatory Visit (INDEPENDENT_AMBULATORY_CARE_PROVIDER_SITE_OTHER): Payer: Medicare HMO

## 2022-03-28 DIAGNOSIS — I495 Sick sinus syndrome: Secondary | ICD-10-CM

## 2022-03-28 LAB — CUP PACEART REMOTE DEVICE CHECK
Battery Remaining Longevity: 105 mo
Battery Voltage: 2.99 V
Brady Statistic AP VP Percent: 3.58 %
Brady Statistic AP VS Percent: 21.38 %
Brady Statistic AS VP Percent: 4.24 %
Brady Statistic AS VS Percent: 70.8 %
Brady Statistic RA Percent Paced: 27.79 %
Brady Statistic RV Percent Paced: 8.14 %
Date Time Interrogation Session: 20230906005137
Implantable Lead Implant Date: 20180604
Implantable Lead Implant Date: 20180604
Implantable Lead Location: 753859
Implantable Lead Location: 753860
Implantable Lead Model: 5076
Implantable Lead Model: 5076
Implantable Pulse Generator Implant Date: 20180604
Lead Channel Impedance Value: 266 Ohm
Lead Channel Impedance Value: 323 Ohm
Lead Channel Impedance Value: 380 Ohm
Lead Channel Impedance Value: 418 Ohm
Lead Channel Pacing Threshold Amplitude: 0.75 V
Lead Channel Pacing Threshold Amplitude: 0.75 V
Lead Channel Pacing Threshold Pulse Width: 0.4 ms
Lead Channel Pacing Threshold Pulse Width: 0.4 ms
Lead Channel Sensing Intrinsic Amplitude: 3.375 mV
Lead Channel Sensing Intrinsic Amplitude: 3.375 mV
Lead Channel Sensing Intrinsic Amplitude: 4.125 mV
Lead Channel Sensing Intrinsic Amplitude: 4.125 mV
Lead Channel Setting Pacing Amplitude: 1.5 V
Lead Channel Setting Pacing Amplitude: 2.5 V
Lead Channel Setting Pacing Pulse Width: 0.4 ms
Lead Channel Setting Sensing Sensitivity: 1.2 mV

## 2022-03-29 ENCOUNTER — Other Ambulatory Visit: Payer: Self-pay

## 2022-03-29 ENCOUNTER — Telehealth: Payer: Self-pay

## 2022-03-29 ENCOUNTER — Ambulatory Visit: Payer: Self-pay | Admitting: *Deleted

## 2022-03-29 ENCOUNTER — Emergency Department (HOSPITAL_BASED_OUTPATIENT_CLINIC_OR_DEPARTMENT_OTHER): Payer: Medicare HMO

## 2022-03-29 ENCOUNTER — Emergency Department (HOSPITAL_BASED_OUTPATIENT_CLINIC_OR_DEPARTMENT_OTHER)
Admission: EM | Admit: 2022-03-29 | Discharge: 2022-03-30 | Disposition: A | Discharge status: Home / Self Care | Payer: Medicare HMO | Attending: Emergency Medicine | Admitting: Emergency Medicine

## 2022-03-29 ENCOUNTER — Encounter (HOSPITAL_BASED_OUTPATIENT_CLINIC_OR_DEPARTMENT_OTHER): Payer: Self-pay

## 2022-03-29 ENCOUNTER — Ambulatory Visit: Payer: Self-pay

## 2022-03-29 DIAGNOSIS — Z7901 Long term (current) use of anticoagulants: Secondary | ICD-10-CM | POA: Insufficient documentation

## 2022-03-29 DIAGNOSIS — M7989 Other specified soft tissue disorders: Secondary | ICD-10-CM | POA: Diagnosis not present

## 2022-03-29 DIAGNOSIS — R2242 Localized swelling, mass and lump, left lower limb: Secondary | ICD-10-CM

## 2022-03-29 DIAGNOSIS — R0602 Shortness of breath: Secondary | ICD-10-CM | POA: Diagnosis not present

## 2022-03-29 DIAGNOSIS — I509 Heart failure, unspecified: Secondary | ICD-10-CM | POA: Insufficient documentation

## 2022-03-29 DIAGNOSIS — R600 Localized edema: Secondary | ICD-10-CM | POA: Insufficient documentation

## 2022-03-29 DIAGNOSIS — R0989 Other specified symptoms and signs involving the circulatory and respiratory systems: Secondary | ICD-10-CM | POA: Diagnosis not present

## 2022-03-29 DIAGNOSIS — I2510 Atherosclerotic heart disease of native coronary artery without angina pectoris: Secondary | ICD-10-CM | POA: Insufficient documentation

## 2022-03-29 DIAGNOSIS — J9811 Atelectasis: Secondary | ICD-10-CM | POA: Diagnosis not present

## 2022-03-29 DIAGNOSIS — I4891 Unspecified atrial fibrillation: Secondary | ICD-10-CM | POA: Insufficient documentation

## 2022-03-29 DIAGNOSIS — Z7902 Long term (current) use of antithrombotics/antiplatelets: Secondary | ICD-10-CM | POA: Diagnosis not present

## 2022-03-29 LAB — CBC
HCT: 37.8 % — ABNORMAL LOW (ref 39.0–52.0)
Hemoglobin: 11.9 g/dL — ABNORMAL LOW (ref 13.0–17.0)
MCH: 29.1 pg (ref 26.0–34.0)
MCHC: 31.5 g/dL (ref 30.0–36.0)
MCV: 92.4 fL (ref 80.0–100.0)
Platelets: 313 10*3/uL (ref 150–400)
RBC: 4.09 MIL/uL — ABNORMAL LOW (ref 4.22–5.81)
RDW: 15.6 % — ABNORMAL HIGH (ref 11.5–15.5)
WBC: 8.6 10*3/uL (ref 4.0–10.5)
nRBC: 0.2 % (ref 0.0–0.2)

## 2022-03-29 LAB — BASIC METABOLIC PANEL
Anion gap: 8 (ref 5–15)
BUN: 14 mg/dL (ref 8–23)
CO2: 30 mmol/L (ref 22–32)
Calcium: 9.7 mg/dL (ref 8.9–10.3)
Chloride: 104 mmol/L (ref 98–111)
Creatinine, Ser: 1.14 mg/dL (ref 0.61–1.24)
GFR, Estimated: >60 mL/min (ref >60)
Glucose, Bld: 94 mg/dL (ref 70–99)
Potassium: 4 mmol/L (ref 3.5–5.1)
Sodium: 142 mmol/L (ref 135–145)

## 2022-03-29 LAB — MAGNESIUM: Magnesium: 2.3 mg/dL (ref 1.7–2.4)

## 2022-03-29 LAB — BRAIN NATRIURETIC PEPTIDE: B Natriuretic Peptide: 48.8 pg/mL (ref 0.0–100.0)

## 2022-03-29 MED ORDER — TORSEMIDE 20 MG PO TABS
20.0000 mg | ORAL_TABLET | Freq: Every day | ORAL | Status: DC
  Filled 2022-03-29: qty 1

## 2022-03-29 NOTE — ED Triage Notes (Signed)
Pt states he was seen here Sunday for left leg swelling.  States his pain and swelling & redness is worse.  Is PO antibiotics and has been taking them as directed

## 2022-03-29 NOTE — Patient Outreach (Signed)
  Care Coordination   03/29/2022 Name: [name] MRN: [id number] DOB: Dec 08, 1940   Care Coordination Outreach Attempts:  An unsuccessful telephone outreach was attempted for a scheduled appointment today.  Follow Up Plan:  Additional outreach attempts will be made to offer the patient care coordination information and services.   Encounter Outcome:  No Answer  Care Coordination Interventions Activated:  No   Care Coordination Interventions:  No, not indicated    Raina Mina, RN Care Management Coordinator [place] Office [phone]

## 2022-03-29 NOTE — Telephone Encounter (Signed)
Reason for Disposition  SEVERE leg swelling (e.g., swelling extends above knee, entire leg is swollen, weeping fluid)  Answer Assessment - Initial Assessment Questions 1. ONSET: "When did the swelling start?" (e.g., minutes, hours, days)     04/16/22 2. LOCATION: "What part of the leg is swollen?"  "Are both legs swollen or just one leg?"     Left up past the knee 3. SEVERITY: "How bad is the swelling?" (e.g., localized; mild, moderate, severe)   - Localized: Small area of swelling localized to one leg.   - MILD pedal edema: Swelling limited to foot and ankle, pitting edema < 1/4 inch (6 mm) deep, rest and elevation eliminate most or all swelling.   - MODERATE edema: Swelling of lower leg to knee, pitting edema > 1/4 inch (6 mm) deep, rest and elevation only partially reduce swelling.   - SEVERE edema: Swelling extends above knee, facial or hand swelling present.      Severe feels very tight 4. REDNESS: "Does the swelling look red or infected?"     *No Answer* 5. PAIN: "Is the swelling painful to touch?" If Yes, ask: "How painful is it?"   (Scale 1-10; mild, moderate or severe)     yes 6. FEVER: "Do you have a fever?" If Yes, ask: "What is it, how was it measured, and when did it start?"      no 7. CAUSE: "What do you think is causing the leg swelling?"     From hitting on bedrail 8. MEDICAL HISTORY: "Do you have a history of blood clots (e.g., DVT), cancer, heart failure, kidney disease, or liver failure?"     *No Answer* 9. RECURRENT SYMPTOM: "Have you had leg swelling before?" If Yes, ask: "When was the last time?" "What happened that time?"     Yes- CHF 10. OTHER SYMPTOMS: "Do you have any other symptoms?" (e.g., chest pain, difficulty breathing)       no 11. PREGNANCY: "Is there any chance you are pregnant?" "When was your last menstrual period?"       N/a  Protocols used: Leg Swelling and Edema-A-AH

## 2022-03-29 NOTE — ED Notes (Signed)
Rad Tech now at bedside for [employer]

## 2022-03-29 NOTE — ED Notes (Signed)
Pt awake and alert; GCS 15.  Dypsnea upon exertion -- mild crackles to lower R posterior bases upon auscultation otherwise lung sounds CTA.  Pt reports DOE is not new; has somewhat worsened since last cardiac procedure.  S1 and S2 muffled; pt on continuous cardiac and pulse ox monitoring.  SB on monitor.  O2 sats 90-93% on RA - pt reports he was told he may have COPD.  Abdomen round, soft, nontender.  LLE +pitting edema with moderate erythema - pt reports numbness but can feel tactile stimulation -- +LLE pedal pulses.  Pt now awaits provider - will monitor for acute changes and maintain plan of care.

## 2022-03-29 NOTE — Telephone Encounter (Signed)
Caller states he was given a Rx of amoxicillin for stubbing his big toe but his breathing has gotten shallow ever since he started taking it. The swelling is from his big toe all the way up to his knee, his leg feel tight. -Caller stated that he stubbed his great toe, his foot swelling up, and the swelling is up to his knee now. He is on Amoxicillin. His breathing is short because he loses his breath at night, chest pain if he coughs. The swelling increased overnight   03/29/2022 3:09:30 PM Go to ED Now Lucky Cowboy, RN, Levada Dy  Comments User: Raford Pitcher, RN Date/Time Eilene Ghazi Time): 03/29/2022 3:10:38 PM Caller's edema is getting worse. I've explained that he needs to be evaluated right away. He's unable to move his toes due to swelling. He's going to think about going to the ER, but may wait to be seen at the office tomorrow.  [facility] - ED REFERRED TO PCP OFFICE  Pt went to ED on 03/26/22 where he was evaluated for Left leg swelling & SOB; was given abx to treat for cellulitis.See d/c note below: 81 year old male with a history of coronary artery disease, atrial flutter on Eliquis, AVM, chronic diastolic heart failure, hypertension, hyperlipidemia, OSA, who presents with concern for left lower extremity redness and swelling.       Reviewed the x-ray of his left foot that had been completed as an outpatient personally and see no sign of fracture or dislocation.   He has palpable pulses bilaterally, no sign of acute arterial thrombus.   DVT study was completed and shows no evidence of DVT.   While he reported no significant change in his shortness of breath, chest x-ray was performed in the setting of leg swelling and dyspnea which did not show evidence of acute CHF.  BNP elevated in comparison to prior, but not severely elevated.  CBC does show anemia with a hemoglobin of 11.9, which is mildly decreased from August 10 to 12 0.3, recommend repeat given previous lead higher  hemoglobin.  No significant leukocytosis.  No clinically significant electrolyte abnormalities.   Does have lower extremity erythema and pain.  No sign of sepsis.  Will treat for cellulitis with antibiotics.  Given prescription for same and discussed with return precautions.  Recommend for outpatient follow-up.  03/29/22 at 1602 - Pt states he's thinking about going to [facility] ED. Says he talked to triage RN at [facility] she recommended that he come to ED b/c he may need IV abx. Pt advised to follow that recommendation. He states SOB that is worse at night or when coughing; this is a chronic issue per the patient. Pt states it will be after 5pm before he gets to ED; pt reassured that as long as he goes soon it's ok. Pt verb understanding.
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
## 2022-03-30 MED ORDER — CEPHALEXIN 500 MG PO CAPS
500.0000 mg | ORAL_CAPSULE | Freq: Four times a day (QID) | ORAL | 0 refills | Status: AC
Start: 2022-03-30 — End: 2022-04-04

## 2022-03-30 NOTE — Discharge Instructions (Addendum)
Today you were seen in the emergency department for your leg swelling.    In the emergency department you had lab work that was reassuring.    At home, please continue your antibiotics and take the Keflex we have prescribed you to help in case of infection.    Check your MyChart online for the results of any tests that had not resulted by the time you left the emergency department.   Follow-up with your primary doctor in 2-3 days regarding your visit.    Return immediately to the emergency department if you experience any of the following: Worsening pain, swelling, fever, or any other concerning symptoms.    Thank you for visiting our Emergency Department. It was a pleasure taking care of you today.

## 2022-04-04 ENCOUNTER — Telehealth: Payer: Self-pay | Admitting: Family Medicine

## 2022-04-04 DIAGNOSIS — J449 Chronic obstructive pulmonary disease, unspecified: Secondary | ICD-10-CM

## 2022-04-04 NOTE — Telephone Encounter (Signed)
Pt called to say he was diagnosed with COPD and he was prescribed:  albuterol (VENTOLIN HFA) 108 (90 Base) MCG/ACT inhaler by the cardiologist   Pt would like to know if MD could prescribe him something better.  Also, Pt states he would like a refill as soon as possible of the medication to treat a yeast infection, as medication always gives him an infection.  Please send to  [id number] [place], [place] - [id number] Phone:  [phone] Fax:  [phone]

## 2022-04-04 NOTE — Telephone Encounter (Signed)
Pharmacy updated.   Please advise

## 2022-04-05 ENCOUNTER — Other Ambulatory Visit: Payer: Self-pay

## 2022-04-05 MED ORDER — FLUCONAZOLE 150 MG PO TABS: 150.0000 mg | ORAL_TABLET | Freq: Every day | ORAL | 1 refills | Status: AC

## 2022-04-05 NOTE — Telephone Encounter (Signed)
There is nothing better than albuterol to use for short term SOB due to bronchospasm (if that is really what he has). I have referred him to Pulmonology to evaluate this further. For the yeast infection, call in Diflucan 150 mg daily, #30 with one rf.

## 2022-04-05 NOTE — Telephone Encounter (Signed)
Pr Rx was sent to his pharmacy, pt agrees for referral to Pulmonology, Referral has been placed

## 2022-04-12 ENCOUNTER — Telehealth: Payer: Self-pay | Admitting: Family Medicine

## 2022-04-12 NOTE — Telephone Encounter (Signed)
Pt is calling and would like new rx for left knee brace send to  [facility] [place], [place] - [address] Phone:  [phone]  Fax:  [phone]

## 2022-04-13 NOTE — Telephone Encounter (Signed)
I need more specific information about what type of brace?

## 2022-04-16 ENCOUNTER — Telehealth: Payer: Self-pay

## 2022-04-16 NOTE — Telephone Encounter (Signed)
---  Caller states he fell out of bed a few weeks ago and landed on his knee and toes and both his left knee and toes are swollen and painful. He states he was seen by a provider and dx with cellulitis. He leg and foot remains swollen to the point that he cant get a shoe on. He is also experiencing yeast infection -- he states the tip of his penis is red, painful, and sometimes has a film. He is taken the pills that were prescribed for it and it is not helping  04/16/2022 11:34:03 AM Go to ED Now [place], RN, [place]  Referrals GO TO FACILITY REFUSED  Pt has appt with PCP on 04/17/22 at [facility]

## 2022-04-16 NOTE — Progress Notes (Signed)
Remote pacemaker transmission.   

## 2022-04-17 ENCOUNTER — Encounter: Payer: Self-pay | Admitting: Family Medicine

## 2022-04-17 ENCOUNTER — Ambulatory Visit (INDEPENDENT_AMBULATORY_CARE_PROVIDER_SITE_OTHER): Payer: Medicare HMO | Admitting: Family Medicine

## 2022-04-17 ENCOUNTER — Telehealth: Payer: Self-pay | Admitting: Family Medicine

## 2022-04-17 VITALS — BP 138/82 | HR 53 | Temp 98.1°F | Wt 209.0 lb

## 2022-04-17 DIAGNOSIS — M25562 Pain in left knee: Secondary | ICD-10-CM | POA: Diagnosis not present

## 2022-04-17 MED ORDER — KETOROLAC TROMETHAMINE 60 MG/2ML IM SOLN
60.0000 mg | Freq: Once | INTRAMUSCULAR | Status: AC
  Administered 2022-04-17: 60 mg via INTRAMUSCULAR

## 2022-04-17 NOTE — Telephone Encounter (Signed)
Angie with [facility] called to inform MD that Pt has been scheduled to see Merla Riches at 1:30 pm - She will call Pt next.

## 2022-04-17 NOTE — Progress Notes (Signed)
   Subjective:    Patient ID: [name], male    DOB: 1941/05/11, 81 y.o.   MRN: [id number]  HPI Here to follow up visits to the ED on 03-26-22 and on 03-29-22 for left leg pain. This all started about 8 weeks ago after he fell out of bed at home. He has had swelling in the left lower leg with severe pain in the left knee and foot. His knee has been warm to the touch. No chest pain or SOB. Between the two ED visits he had normal blood work including CBC's, a normal Xray of the left foot, and a normal US of the left leg to rule out DVT. He is on Eliquis, so a clot is very unlikely. He has not had a fever. He just finished courses of Doxycycline and Keflex, but these have not helped at all. He normally takes Oxycodone 4 times a day for chronic pain, but this has not given him much relief. He has been using his walker around the house, but he has had great difficulty going to the boathroom, preparing food, etc.    Review of Systems  Constitutional: Negative.   Respiratory: Negative.    Cardiovascular:  Positive for leg swelling. Negative for chest pain and palpitations.  Musculoskeletal:  Positive for arthralgias.       Objective:   Physical Exam Constitutional:      Comments: In severe pain, in a wheelchair   Cardiovascular:     Rate and Rhythm: Normal rate and regular rhythm.     Pulses: Normal pulses.     Heart sounds: Normal heart sounds.  Pulmonary:     Effort: Pulmonary effort is normal.     Breath sounds: Normal breath sounds.  Musculoskeletal:     Comments: The left leg is swollen from the knee to the foot. The left knee is warm to the touch but not red. The left foot is also warm but not red. The left knee is extremely tender to the touch, and he guards against any movement of the knee. The left foot is also tender, though less so. No masses or cords are felt.   Neurological:     Mental Status: He is alert.           Assessment & Plan:  Worsening pain in the left knee  and left foot despite taking antibiotics. I am concerned he may have a septic knee. He is given a shot of Toradol for the pain. He will go to the urgent care clinic at [facility] today to be seen immediately.  [name], MD

## 2022-04-17 NOTE — Telephone Encounter (Signed)
Spoke with pt aware thathe has appointment with Dr Doren Custard scheduled at [facility].

## 2022-04-17 NOTE — Telephone Encounter (Signed)
Pt was seen at the office this morning by Dr Sarajane Jews, pt was sent to [facility] Urgent care

## 2022-04-17 NOTE — Addendum Note (Signed)
Addended by: Wyvonne Lenz on: 04/17/2022 11:33 AM   Modules accepted: Orders

## 2022-04-17 NOTE — Telephone Encounter (Signed)
Pt's sister called to say she is at [facility] and she is calling Pt's cell phone and he is not answering and she is becoming very worried.

## 2022-04-18 ENCOUNTER — Ambulatory Visit: Payer: Self-pay | Admitting: Licensed Clinical Social Worker

## 2022-04-18 ENCOUNTER — Telehealth: Payer: Self-pay | Admitting: Family Medicine

## 2022-04-18 MED ORDER — CLOTRIMAZOLE-BETAMETHASONE 1-0.05 % EX CREA: 1.0000 | TOPICAL_CREAM | Freq: Two times a day (BID) | CUTANEOUS | 2 refills | Status: AC

## 2022-04-18 NOTE — Telephone Encounter (Signed)
I sent in a much stronger cream to try (clotrimazole-betamethasone)

## 2022-04-18 NOTE — Telephone Encounter (Signed)
Thank you :)

## 2022-04-18 NOTE — Telephone Encounter (Addendum)
Spoke with patient about new skin cream, will call office if new cream doesn't help will request dermatology referral.

## 2022-04-18 NOTE — Telephone Encounter (Signed)
Pt called to inform MD that the medication prescribed to treat a yeast infection is not quite strong enough and would like MD to send something stronger, if possible  Please advise.  [facility] [place], [place] - [address] Phone:  [phone]

## 2022-04-23 ENCOUNTER — Telehealth: Payer: Self-pay | Admitting: Pharmacist

## 2022-04-23 NOTE — Chronic Care Management (AMB) (Unsigned)
Chronic Care Management Pharmacy Assistant   Name: [name]  MRN: [id number] DOB: [birth year]  Reason for Encounter: ED Follow up call   Recent office visits:  04/17/2022 [name] MD - Patient was seen for acute pain of left knee. No medication changes. Worsening pain in the left knee and left foot despite taking antibiotics. I am concerned he may have a septic knee. He is given a shot of Toradol for the pain. He will go to the urgent care clinic at [facility] today to be seen immediately.  Recent consult visits:  04/17/2022 Shelle Iron PA-C [employer]) - Patient was seen for pain of left knee joint. No additional chart notes.   Hospital visits:  Patient was seen at [facility] ED on 03/29/2022 (6 hours) due to Localized swelling of left lower extremity. Discharge date was 03/30/2022.   New?Medications Started at [facility] Discharge:?? Cephalexin 500 mg qid for 5 days Medication Changes at Hospital Discharge: No medication changes Medications Discontinued at Hospital Discharge: No medications discontinued Medications that remain the same after Hospital Discharge:??  -All other medications will remain the same.    Patient was seen at [facility] ED on 03/26/2022 (2 hours) due to Left leg swelling, Cellulitis of left lower extremity.    New?Medications Started at [facility] Discharge:?? Doxycycline 100 mg twice daily for 10 days Medication Changes at Hospital Discharge: No medication changes Medications Discontinued at Hospital Discharge: No medications discontinued Medications that remain the same after Hospital Discharge:??  -All other medications will remain the same.    Medications: Outpatient Encounter Medications as of 04/23/2022  Medication Sig   acetaminophen (TYLENOL) 500 MG tablet Take 1,000 mg by mouth every 6 (six) hours as needed for moderate pain.   albuterol (VENTOLIN HFA) 108 (90 Base) MCG/ACT inhaler Inhale 2 puffs into the lungs  every 6 (six) hours as needed for wheezing or shortness of breath.   apixaban (ELIQUIS) 5 MG TABS tablet Take 1 tablet (5 mg total) by mouth 2 (two) times daily.   Ascorbic Acid (VITAMIN C PO) Take 1-4 tablets by mouth See admin instructions. Take 1 tablet daily, may increase to 3-4 tabs daily as needed for immune support   Carboxymethylcellulose Sod PF (LUBRICANT EYE DROPS PF) 0.5 % SOLN Place 1 drop into both eyes 4 (four) times daily as needed (dry/irritated eyes.).   clopidogrel (PLAVIX) 75 MG tablet Take 1 tablet (75 mg total) by mouth daily.   clotrimazole-betamethasone (LOTRISONE) cream Apply 1 Application topically 2 (two) times daily.   diclofenac sodium (VOLTAREN) 1 % GEL Apply 1 application topically daily as needed (ankle pain).   diltiazem (CARDIZEM CD) 120 MG 24 hr capsule TAKE 1 CAPSULE BY MOUTH EVERY DAY   fluconazole (DIFLUCAN) 150 MG tablet Take 1 tablet (150 mg total) by mouth daily.   fluticasone (FLONASE) 50 MCG/ACT nasal spray PLACE 1 SPRAY INTO BOTH NOSTRILS DAILY AS NEEDED FOR ALLERGIES OR RHINITIS.   gabapentin (NEURONTIN) 100 MG capsule TAKE 1 CAPSULE BY MOUTH THREE TIMES A DAY (Patient taking differently: Take 100-200 mg by mouth See admin instructions. Take 1 capsule (100 mg) by mouth in the morning & take 2 capsules (200 mg) by mouth at night.)   isosorbide mononitrate (IMDUR) 60 MG 24 hr tablet Take 1.5 tablets (90 mg total) by mouth daily.   ketoconazole (NIZORAL) 2 % cream Apply 1 application topically 2 (two) times daily as needed for irritation.   levothyroxine (SYNTHROID) 175 MCG tablet Take 1 tablet (  175 mcg total) by mouth daily before breakfast.   magnesium oxide (MAG-OX) 400 MG tablet Take 400 mg by mouth daily as needed (restless leg).   moxifloxacin (VIGAMOX) 0.5 % ophthalmic solution Place 1 drop into the right eye in the morning and at bedtime.   nitroGLYCERIN (NITROSTAT) 0.4 MG SL tablet Place 1 tablet (0.4 mg total) under the tongue every 5 (five) minutes  as needed for chest pain.   [START ON 04/29/2022] oxycodone (ROXICODONE) 30 MG immediate release tablet Take 1 tablet (30 mg total) by mouth every 6 (six) hours as needed for pain.   pantoprazole (PROTONIX) 40 MG tablet Take 1 tablet (40 mg total) by mouth daily.   potassium chloride (KLOR-CON) 10 MEQ tablet Take 20 mEq by mouth daily with breakfast.   predniSONE (DELTASONE) 10 MG tablet Take 5 tabs a day for 3 days, then 4 a day for 3 days, then 3 a day for 3 days, then 2 a day for 3 days, then 1 a day for 3 days, then stop (Patient taking differently: Take 30 mg by mouth daily.)   ranolazine (RANEXA) 500 MG 12 hr tablet Take 500 mg by mouth 2 (two) times daily.   ropinirole (REQUIP) 5 MG tablet Take 1-2 tablets (5-10 mg total) by mouth See admin instructions. Take 5 mg in the morning and 10 mg at night, may take 5 mg up to 6 times a day as needed for restless legs (Patient taking differently: Take 5-10 mg by mouth See admin instructions. Take  1 tablet (5 mg) by mouth in the morning and 2 tablets (10 mg) by mouth at night (scheduled), may take 5 mg up to 6 times a day as needed for restless legs)   torsemide (DEMADEX) 20 MG tablet Take 0.5 tablets (10 mg total) by mouth See admin instructions. Take 10 mg daily, may take a second 10 mg dose an hour later if he doesn't use the bathroom (Patient taking differently: Take 20 mg by mouth every Monday, Wednesday, and Friday.)   valACYclovir (VALTREX) 500 MG tablet Take 500 mg by mouth in the morning.   vitamin B-12 (CYANOCOBALAMIN) 500 MCG tablet Take 500 mcg by mouth daily.   No facility-administered encounter medications on file as of 04/23/2022.   Notes:Hospital follow up  Unable to reach patient after several attempts  Care Gaps: AWV - scheduled 08/31/2022  Last BP - 138/82 on 04/17/2022 Last A1C - 6.3 on 11/21/2021 Covid - never done Foot exam - never done Eye exam - never done Urine ACR - never done Shingrix - never done Flu - due Pneumonia  vaccine - postponed  Star Rating Drugs: None  [place] Pharmacist Assistant [phone]

## 2022-04-24 NOTE — Patient Outreach (Signed)
  Care Coordination   Follow Up Visit Note   04/24/2022 Name: [name] MRN: [id number] DOB: 10-Aug-1940  [name] is a 81 y.o. year old male who sees [name], MD for primary care. I spoke with  [name] by phone today.  What matters to the patients health and wellness today?  Pain management and Healthy Coping Skills    Goals Addressed             This Visit's Progress    Managment of Stress and Obtain Supportive Resources   On track    Care Coordination Interventions: Solution-Focused Strategies employed:  Active listening / Reflection utilized  Emotional Support Provided Verbalization of feelings encouraged  Pt has very limited support. States Joslyn Hy agreed to start a go fund me for pt Pt reports increased ability to walk, after yesterday's appt. Toradol has positively impacted his pain management Pt is enjoying spending time with neighbors LCSW reviewed appts Pt is on waitlist for [facility] LCSW informed pt that his medications were successfully sent to preferred pharmacy         SDOH assessments and interventions completed:  No     Care Coordination Interventions Activated:  Yes  Care Coordination Interventions:  Yes, provided   Follow up plan: Follow up call scheduled for 4 weeks    Encounter Outcome:  Pt. Visit Completed   [name], MSW, [email or web address] Phone [phone] 10:04 AM

## 2022-04-24 NOTE — Patient Instructions (Signed)
Visit Information  Thank you for taking time to visit with me today. Please don't hesitate to contact me if I can be of assistance to you.   Following are the goals we discussed today:   Goals Addressed             This Visit's Progress    Managment of Stress and Obtain Supportive Resources   On track    Care Coordination Interventions: Solution-Focused Strategies employed:  Active listening / Reflection utilized  Emotional Support Provided Verbalization of feelings encouraged  Pt has very limited support. States Joslyn Hy agreed to start a go fund me for pt Pt reports increased ability to walk, after yesterday's appt. Toradol has positively impacted his pain management Pt is enjoying spending time with neighbors LCSW reviewed appts Pt is on waitlist for [facility] LCSW informed pt that his medications were successfully sent to preferred pharmacy         Our next appointment is by telephone on 05/16/22 at 10 AM  Please call the care guide team at [phone] if you need to cancel or reschedule your appointment.   If you are experiencing a Mental Health or [place] or need someone to talk to, please call the Suicide and Crisis Lifeline: 988 call 911   Patient verbalizes understanding of instructions and care plan provided today and agrees to view in [place]. Active MyChart status and patient understanding of how to access instructions and care plan via MyChart confirmed with patient.     [name], MSW, [email or web address] Phone [phone] 10:05 AM

## 2022-04-25 ENCOUNTER — Telehealth (INDEPENDENT_AMBULATORY_CARE_PROVIDER_SITE_OTHER): Payer: Medicare HMO | Admitting: Family Medicine

## 2022-04-25 ENCOUNTER — Telehealth (HOSPITAL_COMMUNITY): Payer: Self-pay

## 2022-04-25 ENCOUNTER — Encounter: Payer: Self-pay | Admitting: Family Medicine

## 2022-04-25 DIAGNOSIS — B356 Tinea cruris: Secondary | ICD-10-CM | POA: Diagnosis not present

## 2022-04-25 DIAGNOSIS — M5440 Lumbago with sciatica, unspecified side: Secondary | ICD-10-CM | POA: Diagnosis not present

## 2022-04-25 DIAGNOSIS — F1190 Opioid use, unspecified, uncomplicated: Secondary | ICD-10-CM

## 2022-04-25 NOTE — Telephone Encounter (Signed)
No response from pt regarding CR.  Closed referral.  

## 2022-04-25 NOTE — Progress Notes (Signed)
   Subjective:    Patient ID: [name], male    DOB: [birth year], 81 y.o.   MRN: [id number]  HPI Virtual Visit via Telephone Note  I connected with the patient on 04/25/22 at 10:00 AM EDT by telephone and verified that I am speaking with the correct person using two identifiers.   I discussed the limitations, risks, security and privacy concerns of performing an evaluation and management service by telephone and the availability of in person appointments. I also discussed with the patient that there may be a patient responsible charge related to this service. The patient expressed understanding and agreed to proceed.  Location patient: home Location provider: work or home office Participants present for the call: patient, provider Patient did not have a visit in the prior 7 days to address this/these issue(s).   History of Present Illness: Here for pain management. His back pain has been stable. He has been seeing [facility] for pain and swelling in the left knee. This was determined to be from torn cartilage. After a steroid injection, this has improved quite a bit.    Observations/Objective: Patient sounds cheerful and well on the phone. I do not appreciate any SOB. Speech and thought processing are grossly intact. Patient reported vitals:  Assessment and Plan: Pain management. Indication for chronic opioid: low back pain Medication and dose: Oxycodone 30 mg # pills per month: 120 Last UDS date: 08-29-21 Opioid Treatment Agreement signed (Y/N): 08-26-17 Opioid Treatment Agreement last reviewed with patient:  04-25-22  [place] reviewed this encounter (include red flags): Yes Meds were refilled.  [name], MD   Follow Up Instructions:     [phone] 5-10 [phone] 11-20 [id number] 21-30 I did not refer this patient for an OV in the next 24 hours for this/these issue(s).  I discussed the assessment and treatment plan with the patient. The patient was provided an  opportunity to ask questions and all were answered. The patient agreed with the plan and demonstrated an understanding of the instructions.   The patient was advised to call back or seek an in-person evaluation if the symptoms worsen or if the condition fails to improve as anticipated.  I provided  17 minutes of non-face-to-face time during this encounter.   [name], MD     Review of Systems     Objective:   Physical Exam        Assessment & Plan:

## 2022-05-01 ENCOUNTER — Encounter: Payer: Self-pay | Admitting: Pulmonary Disease

## 2022-05-01 ENCOUNTER — Ambulatory Visit: Payer: Medicare HMO | Admitting: Pulmonary Disease

## 2022-05-01 VITALS — BP 140/70 | HR 61 | Temp 97.4°F | Ht 69.0 in | Wt 212.0 lb

## 2022-05-01 DIAGNOSIS — R0609 Other forms of dyspnea: Secondary | ICD-10-CM

## 2022-05-01 NOTE — Patient Instructions (Signed)
Nice to meet you  No changes to medications today  I think you have lost stamina that is slowly improving after your heart procedures.  You are doing the right things by staying active  If your symptoms are not improving or worsen over the next few months I think we should pursue pulmonary function test to help [place] decide if additional medications would be helpful in treating your symptoms  Return to clinic in 6 months or sooner as needed with Dr. Silas Flood

## 2022-05-02 NOTE — Progress Notes (Signed)
$'@Patient'Z$  ID: [name], male    DOB: [birth year], 81 y.o.   MRN: [id number]  Chief Complaint  Patient presents with   Consult    COPD, sob with exertion and lifting objects.  sob  blockage in heart 4 weeks ago.  Can now breathe when he lays down.    Referring provider: [name], MD  HPI:   81 y.o. man whom are seen in consultation for evaluation of shortness of breath, dyspnea on exertion.  Most recent PCP note reviewed.  Reviewed and cardiology note reviewed.  Patient reports onset of shortness of breath earlier in 2023.  Present for several months.  Minimal exertion yielded significant dyspnea.  Also had significant shortness of breath when lying supine.  Could not do this for period of time.  Work-up of this demonstrated RCA occlusion 01/2022 on left heart cath on my review.  This cannot be intervened upon due to the complexity of the case.  He was brought back 02/28/2022 and successful PCI to RCA was achieved.  Since then he notes marked improvement in his description of shortness of breath when lying supine or in my interpretation orthopnea.  Slowly his dyspnea on exertion is improving.  Getting better and better.  He is more active.  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  Able to jack his car up, perform labor etc.  Walking to and from his garage which is 700 feet.  Previous as he could not do this.  He has albuterol as needed.  Uses rarely.  Really not at all.  He was using it is more short of breath and did find it to be mildly beneficial.  Most recent cross-sectional chest imaging CT chest 01/2022 reviewed and personally interpreted as mild emphysema particularly in the apices, otherwise clear.  Most recent chest x-ray 12/2020 personally reviewed and interpreted as clear lungs bilaterally.  PMH: Tobacco abuse in remission, CAD, emphysema Surgical history: Ankle surgery, appendectomy, cholecystectomy, hernia repair, tonsillectomy Family history: Mother  leukemia, father with CAD, CVA Social history: Former smoker, 100+ pack year, 2 to 3 packs a day from age 7 to approximately age 62, former truck driver, lives in [place], works on Nature conservation officer / Pulmonary Flowsheets:   ACT:      No data to display          MMRC:     No data to display          Epworth:      No data to display          Tests:   FENO:  No results found for: "NITRICOXIDE"  PFT:     No data to display          WALK:      No data to display          Imaging: Personally reviewed No results found.  Lab Results: Personally reviewed CBC    Component Value Date/Time   WBC 8.6 03/29/2022 2026   RBC 4.09 (L) 03/29/2022 2026   HGB 11.9 (L) 03/29/2022 2026   HGB 15.1 02/23/2022 1421   HCT 37.8 (L) 03/29/2022 2026   HCT 45.3 02/23/2022 1421   PLT 313 03/29/2022 2026   PLT 234 02/23/2022 1421   MCV 92.4 03/29/2022 2026   MCV 90 02/23/2022 1421   MCH 29.1 03/29/2022 2026   MCHC 31.5 03/29/2022 2026   RDW 15.6 (H) 03/29/2022 2026   RDW 15.4 02/23/2022  1421   LYMPHSABS 2.2 03/26/2022 1355   LYMPHSABS 2.6 02/23/2022 1421   MONOABS 1.0 03/26/2022 1355   EOSABS 0.3 03/26/2022 1355   EOSABS 0.5 (H) 02/23/2022 1421   BASOSABS 0.1 03/26/2022 1355   BASOSABS 0.1 02/23/2022 1421    BMET    Component Value Date/Time   NA 142 03/29/2022 2026   NA 145 (H) 02/23/2022 1421   K 4.0 03/29/2022 2026   CL 104 03/29/2022 2026   CO2 30 03/29/2022 2026   GLUCOSE 94 03/29/2022 2026   BUN 14 03/29/2022 2026   BUN 12 02/23/2022 1421   CREATININE 1.14 03/29/2022 2026   CREATININE 1.40 (H) 10/09/2017 1012   CALCIUM 9.7 03/29/2022 2026   GFRNONAA >60 03/29/2022 2026   GFRAA >60 02/20/2020 1805    BNP    Component Value Date/Time   BNP 48.8 03/29/2022 2026    ProBNP    Component Value Date/Time   PROBNP 116 01/22/2022 0934   PROBNP 42.0 08/29/2009 1644    Specialty Problems       Pulmonary Problems   Obstructive  sleep apnea   Dyspnea   DOE (dyspnea on exertion)   Cough    Allergies  Allergen Reactions   Brilinta [Ticagrelor] Shortness Of Breath   Colchicine     Syncope-causes patient to pass out   Metoprolol Tartrate Other (See Comments)    Severe chest pains " flat lined patient", chest pain, Tachyarrhythmia   Mirapex [Pramipexole Dihydrochloride]     Severe leg pain   Shellfish Allergy Anaphylaxis and Hives   Statins Other (See Comments)    All statins cause myalgias    Zolpidem Other (See Comments)    Chest pain   Coconut (Cocos Nucifera) Hives   Lasix [Furosemide] Hives and Itching   Levaquin [Levofloxacin] Hives   Trazodone And Nefazodone Other (See Comments)    Unsteady on feet    Immunization History  Administered Date(s) Administered   Fluad Quad(high Dose 65+) 06/05/2021   Influenza Split 07/31/2011, 07/23/2012   Influenza Whole 04/25/2010   Influenza, High Dose Seasonal PF 03/19/2016, 04/09/2017, 05/22/2018, 05/27/2020   Pneumococcal Polysaccharide-23 11/17/2014, 09/08/2020   Td 03/09/2016   Tdap 09/08/2020    Past Medical History:  Diagnosis Date   Adenomatous polyp of colon 2007   Arthritis    Atrial flutter ([place])    AVM (arteriovenous malformation) 2011   a. S/p argon plasma coagulation and ablation in 2011, 2017.   Bradycardia    a. H/o almost 7sec pause nocturnally during 2011 admission. Also has h/o fatigue with BB.   CAD (coronary artery disease)    a. Nonobst disease by cath 2009. b. s/p PCI 10/2014 with DES to Brutus, patent by relook 11/2014 (Brilinta changed to Plavix with improved sx).   Chronic diastolic CHF (congestive heart failure) ([place])    Depression    Diverticulosis    Gastritis 2011   GERD (gastroesophageal reflux disease)    History of hiatal hernia    Hyperlipidemia    Hypertension    Hypothyroidism    Myocardial infarction ([place])    Obstructive sleep apnea    -no cpap use   Pre-diabetes    Premature atrial contractions Holter  2016   Presence of permanent cardiac pacemaker    PVC's (premature ventricular contractions) Holter 2016   Statin intolerance    Transfusion history    several years ago -GI bleed    Tobacco History: Social History   Tobacco Use  Smoking Status Former   Packs/day: 2.00   Years: 50.00   Total pack years: 100.00   Types: Cigarettes   Quit date: 07/23/2002   Years since quitting: 19.7   Passive exposure: Past  Smokeless Tobacco Never   Counseling given: Not Answered   Continue to not smoke  Outpatient Encounter Medications as of 05/01/2022  Medication Sig   acetaminophen (TYLENOL) 500 MG tablet Take 1,000 mg by mouth every 6 (six) hours as needed for moderate pain.   albuterol (VENTOLIN HFA) 108 (90 Base) MCG/ACT inhaler Inhale 2 puffs into the lungs every 6 (six) hours as needed for wheezing or shortness of breath.   apixaban (ELIQUIS) 5 MG TABS tablet Take 1 tablet (5 mg total) by mouth 2 (two) times daily.   Ascorbic Acid (VITAMIN C PO) Take 1-4 tablets by mouth See admin instructions. Take 1 tablet daily, may increase to 3-4 tabs daily as needed for immune support   Carboxymethylcellulose Sod PF (LUBRICANT EYE DROPS PF) 0.5 % SOLN Place 1 drop into both eyes 4 (four) times daily as needed (dry/irritated eyes.).   clopidogrel (PLAVIX) 75 MG tablet Take 1 tablet (75 mg total) by mouth daily.   clotrimazole-betamethasone (LOTRISONE) cream Apply 1 Application topically 2 (two) times daily.   diclofenac sodium (VOLTAREN) 1 % GEL Apply 1 application topically daily as needed (ankle pain).   diltiazem (CARDIZEM CD) 120 MG 24 hr capsule TAKE 1 CAPSULE BY MOUTH EVERY DAY   fluconazole (DIFLUCAN) 150 MG tablet Take 1 tablet (150 mg total) by mouth daily.   fluticasone (FLONASE) 50 MCG/ACT nasal spray PLACE 1 SPRAY INTO BOTH NOSTRILS DAILY AS NEEDED FOR ALLERGIES OR RHINITIS.   gabapentin (NEURONTIN) 100 MG capsule TAKE 1 CAPSULE BY MOUTH THREE TIMES A DAY (Patient taking differently:  Take 100-200 mg by mouth See admin instructions. Take 1 capsule (100 mg) by mouth in the morning & take 2 capsules (200 mg) by mouth at night.)   isosorbide mononitrate (IMDUR) 60 MG 24 hr tablet Take 1.5 tablets (90 mg total) by mouth daily.   ketoconazole (NIZORAL) 2 % cream Apply 1 application topically 2 (two) times daily as needed for irritation.   levothyroxine (SYNTHROID) 175 MCG tablet Take 1 tablet (175 mcg total) by mouth daily before breakfast.   magnesium oxide (MAG-OX) 400 MG tablet Take 400 mg by mouth daily as needed (restless leg).   moxifloxacin (VIGAMOX) 0.5 % ophthalmic solution Place 1 drop into the right eye in the morning and at bedtime.   nitroGLYCERIN (NITROSTAT) 0.4 MG SL tablet Place 1 tablet (0.4 mg total) under the tongue every 5 (five) minutes as needed for chest pain.   oxycodone (ROXICODONE) 30 MG immediate release tablet Take 1 tablet (30 mg total) by mouth every 6 (six) hours as needed for pain.   pantoprazole (PROTONIX) 40 MG tablet Take 1 tablet (40 mg total) by mouth daily.   potassium chloride (KLOR-CON) 10 MEQ tablet Take 20 mEq by mouth daily with breakfast.   ranolazine (RANEXA) 500 MG 12 hr tablet Take 500 mg by mouth 2 (two) times daily.   ropinirole (REQUIP) 5 MG tablet Take 1-2 tablets (5-10 mg total) by mouth See admin instructions. Take 5 mg in the morning and 10 mg at night, may take 5 mg up to 6 times a day as needed for restless legs (Patient taking differently: Take 5-10 mg by mouth See admin instructions. Take  1 tablet (5 mg) by mouth in the morning and 2  tablets (10 mg) by mouth at night (scheduled), may take 5 mg up to 6 times a day as needed for restless legs)   torsemide (DEMADEX) 20 MG tablet Take 0.5 tablets (10 mg total) by mouth See admin instructions. Take 10 mg daily, may take a second 10 mg dose an hour later if he doesn't use the bathroom (Patient taking differently: Take 20 mg by mouth every Monday, Wednesday, and Friday.)   valACYclovir  (VALTREX) 500 MG tablet Take 500 mg by mouth in the morning.   vitamin B-12 (CYANOCOBALAMIN) 500 MCG tablet Take 500 mcg by mouth daily.   [DISCONTINUED] predniSONE (DELTASONE) 10 MG tablet Take 5 tabs a day for 3 days, then 4 a day for 3 days, then 3 a day for 3 days, then 2 a day for 3 days, then 1 a day for 3 days, then stop (Patient not taking: Reported on 05/01/2022)   No facility-administered encounter medications on file as of 05/01/2022.     Review of Systems  Review of Systems  No chest pain with exertion.  No orthopnea or PND.  Comprehensive review of systems otherwise negative. Physical Exam  BP (!) 140/70 (BP Location: Left Arm, Patient Position: Sitting, Cuff Size: Normal)   Pulse 61   Temp (!) 97.4 F (36.3 C) (Oral)   Ht '5\' 9"'$  (1.753 m)   Wt 212 lb (96.2 kg)   SpO2 93%   BMI 31.31 kg/m   Wt Readings from Last 5 Encounters:  05/01/22 212 lb (96.2 kg)  04/17/22 209 lb (94.8 kg)  03/29/22 215 lb (97.5 kg)  03/21/22 224 lb 2 oz (101.7 kg)  03/16/22 230 lb 12.8 oz (104.7 kg)    BMI Readings from Last 5 Encounters:  05/01/22 31.31 kg/m  04/17/22 30.86 kg/m  03/29/22 31.75 kg/m  03/21/22 32.16 kg/m  03/16/22 33.12 kg/m     Physical Exam General: Sitting in chair, no acute distress Eyes: EOMI, no icterus Neck: Supple, no JVP appreciated Pulmonary: Clear, distant, normal work of breathing Cardiovascular: Warm, no edema noted Abdomen: Nondistended, bowel sounds present MSK: No synovitis, no joint effusion Neuro: Normal gait, no weakness Psych: Normal mood, full affect  Assessment & Plan:   Dyspnea on exertion: Present for months in 2023.  Suspect multifactorial.  Fortunately improving.  This following left heart catheterization with intervention to RCA 02/2022.  Suspect primary driver initially was cardiac now with ongoing deconditioning is slowly improving.  He had no issues with dyspnea prior.  He does have emphysema on CT scan 01/2022.  This could be  contributing as well.  Encouraged ongoing activity, slowly increasing as able.  If symptoms are worsening or not improving in interval follow-up recommend obtaining PFTs.  PFTs were offered today but he declined.  Tobacco abuse in remission: Likely 100+ pack year based on description of 2 to 3 packs a day from age 69 to age 51.  He quit around 2000.  Given time from last cigarette, he is not a candidate for lung cancer screening.  Return in about 6 months (around 10/31/2022).   Lanier Clam, MD 05/02/2022

## 2022-05-08 ENCOUNTER — Other Ambulatory Visit: Payer: Self-pay | Admitting: Family Medicine

## 2022-05-13 NOTE — Progress Notes (Unsigned)
No chief complaint on file.    History of Present Illness: 81 yo male with history of CAD, HTN, HLD, sleep apnea, sinus node dysfunction s/p pacemaker, atrial flutter here today for follow up. Cardiac caths in 2009 and 2014 with moderate non-obstructive CAD. He has not tolerated statins. He was admitted to [facility] 2016 with unstable angina. Cardiac cath with severe disease in the LAD and RCA, both treated with drug eluting stents. Repeat cath May 2016 due to dyspnea with stable disease. Symptoms resolved off of Brilinta. Cardiac monitor June 2016 with PACs, PVCs. He has had severe anemia due to angiodysplasia. Cardiac monitor May 2017 with nocturnal bradycardia with rates as low as 22 bpm with blocked PACs. Echo June 2017 with normal LV systolic function, grade 1 diastolic dysfunction, moderate LVH. He was seen in the EP clinic by Dr. Lennie Odor 12/30/15 and sleep study was arranged but he did not keep this appt. He did not tolerate Norvasc due to LE edema and did not tolerate beta blockers due to bradycardia. I saw him 12/03/16 and he c/o episodes of dizziness but no palpitations. I arranged a cardiac monitor which showed atrial fib/flutter and long pauses. Xarelto was started. Plavix was stopped. He was seen by EP and a pacemaker was implanted on 12/24/16. Synthroid increased due to high TSH. Following his pacemaker implantation, he has c/o dry cough and he was treated with a round of antibiotics by primary care. He also had c/o chest pain and dizziness. He was seen in our office 01/08/17 by Melina Copa, PA-C and based on is complaints, he was admitted to [facility] from our office. Echo 01/08/17 with normal LV size and function and no valve issues. Nuclear stress test showed a large apical/anteroapical and inferoapical scar but no ischemia. D-dimer negative. BNP was normal. Troponin negative. I saw him in my office 01/14/17 and he felt terrible with c/o weakness, fatigue, aching in legs, rare chest pains. TSH was  elevated in June and Synthroid was increased. He was seen in primary care 01/16/17 and no new recommendations given. Carotid dopplers June 2017 with mild bllateral disease. Nuclear stress test July 2020 with no ischemia. Echo July 2020 with LVEF=60-65%, no valve disease. I saw him by video visit January 2021 and he was doing well. He had stopped his Eliquis due to insurance issues but we restarted at that visit. He was seen in June 2022 with worsened dyspnea. He had been seen in the ED 12/20/20 with c/o dyspnea and orthopnea. Reported not taking Lasix because it made him urinate too much. BNP was normal. Troponin was normal. Chest x-ray and EKG ok. Was given IV Lasix in the ED and felt better. He was seen in the ED again 12/25/20 after reporting a reaction with hives after taking oral Lasix at home. He has been on torsemide. He was seen in our office in July 2023 with c/o chest pain. Echo 01/25/22 with LVEF=60-65%. Cardiac cath 01/25/21 with CTO of the RCA. Patent LAD stent with diffuse distal LAD disease. CTO PCI of the RCA in August 2023 with drug eluting stent placed in the RCA.   He is here today for follow up. The patient denies any chest pain, dyspnea, palpitations, lower extremity edema, orthopnea, PND, dizziness, near syncope or syncope.    Primary Care Physician: [name], MD  Past Medical History:  Diagnosis Date   Adenomatous polyp of colon 2007   Arthritis    Atrial flutter ([place])  AVM (arteriovenous malformation) 2011   a. S/p argon plasma coagulation and ablation in 2011, 2017.   Bradycardia    a. H/o almost 7sec pause nocturnally during 2011 admission. Also has h/o fatigue with BB.   CAD (coronary artery disease)    a. Nonobst disease by cath 2009. b. s/p PCI 10/2014 with DES to [place], patent by relook 11/2014 (Brilinta changed to Plavix with improved sx).   Chronic diastolic CHF (congestive heart failure) (HCC)    Depression    Diverticulosis    Gastritis 2011   GERD  (gastroesophageal reflux disease)    History of hiatal hernia    Hyperlipidemia    Hypertension    Hypothyroidism    Myocardial infarction ([place])    Obstructive sleep apnea    -no cpap use   Pre-diabetes    Premature atrial contractions Holter 2016   Presence of permanent cardiac pacemaker    PVC's (premature ventricular contractions) Holter 2016   Statin intolerance    Transfusion history    several years ago -GI bleed    Past Surgical History:  Procedure Laterality Date   ANKLE SURGERY Left    APPENDECTOMY     CARDIAC CATHETERIZATION N/A 11/26/2014   Procedure: Left Heart Cath and Coronary Angiography;  Surgeon: Burnell Blanks, MD;  Location: [place] [facility];  Service: Cardiovascular;  Laterality: N/A;   CHOLECYSTECTOMY N/A 08/23/2016   Procedure: LAPAROSCOPIC CHOLECYSTECTOMY;  Surgeon: Coralie Keens, MD;  Location: [place];  Service: General;  Laterality: N/A;   COLONOSCOPY  08-23-05   per Dr. Deatra Ina, adenomatous polyps, repeat in 5 yrs    COLONOSCOPY WITH PROPOFOL N/A 12/06/2015   Procedure: COLONOSCOPY WITH PROPOFOL;  Surgeon: Doran Stabler, MD;  Location: [facility];  Service: Gastroenterology;  Laterality: N/A;   CORONARY ANGIOGRAPHY N/A 02/28/2022   Procedure: CORONARY ANGIOGRAPHY;  Surgeon: Martinique, Peter M, MD;  Location: [place] [facility];  Service: Cardiovascular;  Laterality: N/A;   CORONARY CTO INTERVENTION N/A 02/28/2022   Procedure: CORONARY CTO INTERVENTION;  Surgeon: Martinique, Peter M, MD;  Location: [place] [facility];  Service: Cardiovascular;  Laterality: N/A;   CORONARY STENT INTERVENTION N/A 01/25/2022   Procedure: CORONARY STENT INTERVENTION;  Surgeon: Burnell Blanks, MD;  Location: [place] [facility];  Service: Cardiovascular;  Laterality: N/A;   ENTEROSCOPY N/A 12/06/2015   Procedure: ENTEROSCOPY;  Surgeon: Doran Stabler, MD;  Location: [facility];  Service: Gastroenterology;  Laterality: N/A;   ESOPHAGOGASTRODUODENOSCOPY  08-23-05   per  Dr. Deatra Ina, cauterized jejunal AVMs    [place] N/A 12/06/2015   Procedure: HOT HEMOSTASIS (ARGON PLASMA COAGULATION/BICAP);  Surgeon: Doran Stabler, MD;  Location: [facility];  Service: Gastroenterology;  Laterality: N/A;   INGUINAL HERNIA REPAIR Left 06/29/2021   Procedure: LAPAROSCOPIC LEFT INGUINAL HERNIA REPAIR WITH MESH;  Surgeon: Ralene Ok, MD;  Location: [place];  Service: General;  Laterality: Left;   INTRAVASCULAR ULTRASOUND/IVUS N/A 02/28/2022   Procedure: Intravascular Ultrasound/IVUS;  Surgeon: Martinique, Peter M, MD;  Location: [place] [facility];  Service: Cardiovascular;  Laterality: N/A;   LEFT HEART CATHETERIZATION WITH CORONARY ANGIOGRAM N/A 09/08/2012   Procedure: LEFT HEART CATHETERIZATION WITH CORONARY ANGIOGRAM;  Surgeon: Burnell Blanks, MD;  Location: [facility];  Service: Cardiovascular;  Laterality: N/A;   LEFT HEART CATHETERIZATION WITH CORONARY ANGIOGRAM N/A 11/16/2014   Procedure: LEFT HEART CATHETERIZATION WITH CORONARY ANGIOGRAM;  Surgeon: Leonie Man, MD;  Location: [place] [facility];  Service: Cardiovascular;  Laterality: N/A;   PACEMAKER IMPLANT N/A 12/24/2016   Procedure: Pacemaker Implant;  Surgeon: Deboraha Sprang, MD;  Location: [place] [facility];  Service: Cardiovascular;  Laterality: N/A;   RIGHT/LEFT HEART CATH AND CORONARY ANGIOGRAPHY N/A 01/25/2022   Procedure: RIGHT/LEFT HEART CATH AND CORONARY ANGIOGRAPHY;  Surgeon: Burnell Blanks, MD;  Location: [place] [facility];  Service: Cardiovascular;  Laterality: N/A;   SKIN GRAFT Right    leg   TONSILLECTOMY     UMBILICAL HERNIA REPAIR N/A 06/29/2021   Procedure: OPEN UMBILICAL HERNIA REPAIR WITH MESH;  Surgeon: Ralene Ok, MD;  Location: [place];  Service: General;  Laterality: N/A;    Current Outpatient Medications  Medication Sig Dispense Refill   acetaminophen (TYLENOL) 500 MG tablet Take 1,000 mg by mouth every 6 (six) hours as needed for moderate pain.      albuterol (VENTOLIN HFA) 108 (90 Base) MCG/ACT inhaler Inhale 2 puffs into the lungs every 6 (six) hours as needed for wheezing or shortness of breath. 8 g 2   apixaban (ELIQUIS) 5 MG TABS tablet Take 1 tablet (5 mg total) by mouth 2 (two) times daily. 60 tablet    Ascorbic Acid (VITAMIN C PO) Take 1-4 tablets by mouth See admin instructions. Take 1 tablet daily, may increase to 3-4 tabs daily as needed for immune support     Carboxymethylcellulose Sod PF (LUBRICANT EYE DROPS PF) 0.5 % SOLN Place 1 drop into both eyes 4 (four) times daily as needed (dry/irritated eyes.).     clopidogrel (PLAVIX) 75 MG tablet Take 1 tablet (75 mg total) by mouth daily. 90 tablet 3   clotrimazole-betamethasone (LOTRISONE) cream Apply 1 Application topically 2 (two) times daily. 45 g 2   diclofenac sodium (VOLTAREN) 1 % GEL Apply 1 application topically daily as needed (ankle pain). 5 Tube 10   diltiazem (CARDIZEM CD) 120 MG 24 hr capsule TAKE 1 CAPSULE BY MOUTH EVERY DAY 90 capsule 1   fluconazole (DIFLUCAN) 150 MG tablet Take 1 tablet (150 mg total) by mouth daily. 30 tablet 1   fluticasone (FLONASE) 50 MCG/ACT nasal spray PLACE 1 SPRAY INTO BOTH NOSTRILS DAILY AS NEEDED FOR ALLERGIES OR RHINITIS. 48 mL 1   gabapentin (NEURONTIN) 100 MG capsule TAKE 1 CAPSULE BY MOUTH THREE TIMES A DAY (Patient taking differently: Take 100-200 mg by mouth See admin instructions. Take 1 capsule (100 mg) by mouth in the morning & take 2 capsules (200 mg) by mouth at night.) 90 capsule 1   isosorbide mononitrate (IMDUR) 60 MG 24 hr tablet Take 1.5 tablets (90 mg total) by mouth daily. 90 tablet 1   ketoconazole (NIZORAL) 2 % cream Apply 1 application topically 2 (two) times daily as needed for irritation. 30 g 5   levothyroxine (SYNTHROID) 175 MCG tablet Take 1 tablet (175 mcg total) by mouth daily before breakfast. 90 tablet 1   magnesium oxide (MAG-OX) 400 MG tablet Take 400 mg by mouth daily as needed (restless leg).     moxifloxacin  (VIGAMOX) 0.5 % ophthalmic solution Place 1 drop into the right eye in the morning and at bedtime.     nitroGLYCERIN (NITROSTAT) 0.4 MG SL tablet Place 1 tablet (0.4 mg total) under the tongue every 5 (five) minutes as needed for chest pain. 25 tablet 1   oxycodone (ROXICODONE) 30 MG immediate release tablet Take 1 tablet (30 mg total) by mouth every 6 (six) hours as needed for  pain. 120 tablet 0   pantoprazole (PROTONIX) 40 MG tablet Take 1 tablet (40 mg total) by mouth daily. 30 tablet 2   potassium chloride (KLOR-CON) 10 MEQ tablet Take 20 mEq by mouth daily with breakfast.     ranolazine (RANEXA) 500 MG 12 hr tablet Take 500 mg by mouth 2 (two) times daily.     ropinirole (REQUIP) 5 MG tablet Take 1-2 tablets (5-10 mg total) by mouth See admin instructions. Take 5 mg in the morning and 10 mg at night, may take 5 mg up to 6 times a day as needed for restless legs (Patient taking differently: Take 5-10 mg by mouth See admin instructions. Take  1 tablet (5 mg) by mouth in the morning and 2 tablets (10 mg) by mouth at night (scheduled), may take 5 mg up to 6 times a day as needed for restless legs)     torsemide (DEMADEX) 20 MG tablet Take 0.5 tablets (10 mg total) by mouth See admin instructions. Take 10 mg daily, may take a second 10 mg dose an hour later if he doesn't use the bathroom (Patient taking differently: Take 20 mg by mouth every Monday, Wednesday, and Friday.)     valACYclovir (VALTREX) 500 MG tablet Take 500 mg by mouth in the morning.     vitamin B-12 (CYANOCOBALAMIN) 500 MCG tablet Take 500 mcg by mouth daily.     No current facility-administered medications for this visit.    Allergies  Allergen Reactions   Brilinta [Ticagrelor] Shortness Of Breath   Colchicine     Syncope-causes patient to pass out   Metoprolol Tartrate Other (See Comments)    Severe chest pains " flat lined patient", chest pain, Tachyarrhythmia   Mirapex [Pramipexole Dihydrochloride]     Severe leg pain    Shellfish Allergy Anaphylaxis and Hives   Statins Other (See Comments)    All statins cause myalgias    Zolpidem Other (See Comments)    Chest pain   Coconut (Cocos Nucifera) Hives   Lasix [Furosemide] Hives and Itching   Levaquin [Levofloxacin] Hives   Trazodone And Nefazodone Other (See Comments)    Unsteady on feet    Social History   Socioeconomic History   Marital status: Widowed    Spouse name: Not on file   Number of children: 1   Years of education: 30   Highest education level: 12th grade  Occupational History   Occupation: Disabled    Employer: UNEMPLOYED  Tobacco Use   Smoking status: Former    Packs/day: 2.00    Years: 50.00    Total pack years: 100.00    Types: Cigarettes    Quit date: 07/23/2002    Years since quitting: 19.8    Passive exposure: Past   Smokeless tobacco: Never  Vaping Use   Vaping Use: Never used  Substance and Sexual Activity   Alcohol use: No    Alcohol/week: 0.0 standard drinks of alcohol   Drug use: No   Sexual activity: Yes  Other Topics Concern   Not on file  Social History Narrative   Not on file   Social Determinants of Health   Financial Resource Strain: Low Risk  (08/29/2021)   Overall Financial Resource Strain (CARDIA)    Difficulty of Paying Living Expenses: Not very hard  Food Insecurity: No Food Insecurity (03/15/2022)   Hunger Vital Sign    Worried About Running Out of Food in the Last Year: Never true    Ran Out  of Food in the Last Year: Never true  Recent Concern: [place] Present (03/07/2022)   Hunger Vital Sign    Worried About Running Out of Food in the Last Year: Often true    Ran Out of Food in the Last Year: Often true  Transportation Needs: No Transportation Needs (03/15/2022)   PRAPARE - Hydrologist (Medical): No    Lack of Transportation (Non-Medical): No  Physical Activity: Inactive (08/29/2021)   Exercise Vital Sign    Days of Exercise per Week: 0  days    Minutes of Exercise per Session: 0 min  Stress: No Stress Concern Present (03/20/2021)   [place]    Feeling of Stress : Not at all  Social Connections: Socially Isolated (08/29/2021)   Social Connection and Isolation Panel [NHANES]    Frequency of Communication with Friends and Family: More than three times a week    Frequency of Social Gatherings with Friends and Family: More than three times a week    Attends Religious Services: Never    Marine scientist or Organizations: No    Attends Archivist Meetings: Never    Marital Status: Widowed  Intimate Partner Violence: Not At Risk (08/29/2021)   Humiliation, Afraid, Rape, and Kick questionnaire    Fear of Current or Ex-Partner: No    Emotionally Abused: No    Physically Abused: No    Sexually Abused: No    Family History  Problem Relation Age of Onset   Leukemia Mother    Heart disease Father    Stroke Father    Heart attack Father    Alcoholism Paternal Uncle    Alcoholism Maternal Grandfather    Colon cancer Neg Hx    Esophageal cancer Neg Hx     Review of Systems:  As stated in the HPI and otherwise negative.   There were no vitals taken for this visit.  Physical Examination: General: Well developed, well nourished, NAD  HEENT: OP clear, mucus membranes moist  SKIN: warm, dry. No rashes. Neuro: No focal deficits  Musculoskeletal: Muscle strength 5/5 all ext  Psychiatric: Mood and affect normal  Neck: No JVD, no carotid bruits, no thyromegaly, no lymphadenopathy.  Lungs:Clear bilaterally, no wheezes, rhonci, crackles Cardiovascular: Regular rate and rhythm. No murmurs, gallops or rubs. Abdomen:Soft. Bowel sounds present. Non-tender.  Extremities: No lower extremity edema. Pulses are 2 + in the bilateral DP/PT.  Echo 01/25/21:    1. Left ventricular ejection fraction, by estimation, is 55 to 60%. The  left ventricle has normal  function. The left ventricle has no regional  wall motion abnormalities. Left ventricular diastolic parameters are  consistent with Grade I diastolic  dysfunction (impaired relaxation).   2. Right ventricular systolic function is normal. The right ventricular  size is mildly enlarged.   3. The mitral valve is normal in structure. Trivial mitral valve  regurgitation. No evidence of mitral stenosis.   4. The aortic valve is tricuspid. There is mild calcification of the  aortic valve. There is mild thickening of the aortic valve. Aortic valve  regurgitation is not visualized. Aortic valve sclerosis is present, with  no evidence of aortic valve stenosis.  Aortic valve mean gradient measures 4.0 mmHg. Aortic valve Vmax measures  1.33 m/s.   5. The inferior vena cava is normal in size with greater than 50%  respiratory variability, suggesting right atrial pressure of  3 mmHg.   EKG:  EKG is not *** ordered today. The ekg ordered today demonstrates    Recent Labs: 01/22/2022: NT-Pro BNP 116 02/09/2022: TSH 0.045 03/26/2022: ALT 14 03/29/2022: B Natriuretic Peptide 48.8; BUN 14; Creatinine, Ser 1.14; Hemoglobin 11.9; Magnesium 2.3; Platelets 313; Potassium 4.0; Sodium 142   Lipid Panel    Component Value Date/Time   CHOL 148 12/11/2017 0837   TRIG 113 12/11/2017 0837   HDL 38 (L) 12/11/2017 0837   CHOLHDL 3.9 12/11/2017 0837   CHOLHDL 4.6 03/22/2016 0935   VLDL 46 (H) 03/22/2016 0935   LDLCALC 87 12/11/2017 0837     Wt Readings from Last 3 Encounters:  05/01/22 212 lb (96.2 kg)  04/17/22 209 lb (94.8 kg)  03/29/22 215 lb (97.5 kg)    Assessment and Plan:   1. CAD with stable angina: No chest pain. Continue Plaivx, Imdur and Ranexa. He is not on ASA since he is on Eliquis. He is statin intolerant. He stopped the Zetia. He does not wish to restart. He refuses to take beta blockers.    2. Chronic diastolic CHF:  No volume overload on exam. Continue Torsemide      3. Hyperlipidemia:  Intolerant of statins. He does not wish to take Zetia. He does not wish to be referred to the lipid clinic for any other therapies.    4. Sinus node dysfunction: s/p pacemaker.  Followed by Dr. [name].    5. Atrial flutter, paroxysmal: Sinus today. Continue Eliquis and Cardizem   Labs/ tests ordered today include:   No orders of the defined types were placed in this encounter.   Disposition:   FU with me in 12 months    Signed, [name], MD 05/13/2022 1:01 PM    [facility] [place], [place], [place]  [address] Phone: [phone]; Fax: [phone]

## 2022-05-14 ENCOUNTER — Encounter: Payer: Self-pay | Admitting: Cardiovascular Disease

## 2022-05-14 ENCOUNTER — Ambulatory Visit: Payer: Medicare HMO | Attending: Cardiovascular Disease | Admitting: Cardiovascular Disease

## 2022-05-14 VITALS — BP 136/70 | HR 69 | Ht 69.0 in | Wt 208.0 lb

## 2022-05-14 DIAGNOSIS — I480 Paroxysmal atrial fibrillation: Secondary | ICD-10-CM | POA: Diagnosis not present

## 2022-05-14 DIAGNOSIS — I25119 Atherosclerotic heart disease of native coronary artery with unspecified angina pectoris: Secondary | ICD-10-CM

## 2022-05-14 DIAGNOSIS — I5032 Chronic diastolic (congestive) heart failure: Secondary | ICD-10-CM | POA: Diagnosis not present

## 2022-05-14 DIAGNOSIS — E785 Hyperlipidemia, unspecified: Secondary | ICD-10-CM | POA: Diagnosis not present

## 2022-05-14 NOTE — Patient Instructions (Signed)
Medication Instructions:  No changes *If you need a refill on your cardiac medications before your next appointment, please call your pharmacy*   Lab Work: none If you have labs (blood work) drawn today and your tests are completely normal, you will receive your results only by: MyChart Message (if you have MyChart) OR A paper copy in the mail If you have any lab test that is abnormal or we need to change your treatment, we will call you to review the results.   Testing/Procedures: none   Follow-Up: At [facility], you and your health needs are our priority.  As part of our continuing mission to provide you with exceptional heart care, we have created designated Provider Care Teams.  These Care Teams include your primary Cardiologist (physician) and Advanced Practice Providers (APPs -  Physician Assistants and Nurse Practitioners) who all work together to provide you with the care you need, when you need it.   Your next appointment:   12 month(s)  The format for your next appointment:   In Person  Provider:   Christopher McAlhany, MD     Important Information About Sugar       

## 2022-05-16 ENCOUNTER — Ambulatory Visit: Payer: Self-pay | Admitting: Licensed Clinical Social Worker

## 2022-05-21 DIAGNOSIS — H2512 Age-related nuclear cataract, left eye: Secondary | ICD-10-CM | POA: Diagnosis not present

## 2022-05-21 DIAGNOSIS — H35371 Puckering of macula, right eye: Secondary | ICD-10-CM | POA: Diagnosis not present

## 2022-05-23 ENCOUNTER — Other Ambulatory Visit: Payer: Self-pay | Admitting: Family Medicine

## 2022-05-23 NOTE — Patient Outreach (Signed)
  Care Coordination   Follow Up Visit Note   05/23/2022 Name: [name] MRN: [id number] DOB: 1940/12/02  [name] is a 81 y.o. year old male who sees Christopher Morale, MD for primary care. I spoke with  [name] by phone today.  What matters to the patients health and wellness today?  Self-Care and Supportive Resources    Goals Addressed             This Visit's Progress    Managment of Stress and Obtain Supportive Resources   On track    Care Coordination Interventions: Solution-Focused Strategies employed:  Active listening / Reflection utilized  Emotional Support Provided Verbalization of feelings encouraged  Pt endorses continued back pain, negatively impacting his functioning around the home  In home assistance options discussed. Pt reports inability to afford aid. Receives limited assistance through family Patient is interested in having a ramp and walk-in shower installed Family purchased a small scooter for pt to utilize in and out of the home to promote safety and reduce fall risk LCSW discussed strategies to identify limitations and not over exert body. Self-care encouraged            SDOH assessments and interventions completed:  No     Care Coordination Interventions Activated:  Yes  Care Coordination Interventions:  Yes, provided   Follow up plan: Follow up call scheduled for 11/15    Encounter Outcome:  Pt. Visit Completed   [name], MSW, [name] Phone [phone] 5:27 AM

## 2022-05-23 NOTE — Patient Instructions (Signed)
Visit Information  Thank you for taking time to visit with me today. Please don't hesitate to contact me if I can be of assistance to you.   Following are the goals we discussed today:   Goals Addressed             This Visit's Progress    Managment of Stress and Obtain Supportive Resources   On track    Care Coordination Interventions: Solution-Focused Strategies employed:  Active listening / Reflection utilized  Emotional Support Provided Verbalization of feelings encouraged  Pt endorses continued back pain, negatively impacting his functioning around the home  In home assistance options discussed. Pt reports inability to afford aid. Receives limited assistance through family Patient is interested in having a ramp and walk-in shower installed Family purchased a small scooter for pt to utilize in and out of the home to promote safety and reduce fall risk LCSW discussed strategies to identify limitations and not over exert body. Self-care encouraged            Our next appointment is by telephone on 06/06/22 at 11:30 AM  Please call the care guide team at [phone] if you need to cancel or reschedule your appointment.   If you are experiencing a Mental Health or [place] or need someone to talk to, please call the Suicide and Crisis Lifeline: 988 call 911   Patient verbalizes understanding of instructions and care plan provided today and agrees to view in [place]. Active MyChart status and patient understanding of how to access instructions and care plan via MyChart confirmed with patient.     [name], MSW, [email or web address] Phone [phone] 5:27 AM

## 2022-05-26 ENCOUNTER — Other Ambulatory Visit: Payer: Self-pay | Admitting: Cardiology

## 2022-06-01 ENCOUNTER — Telehealth: Payer: Self-pay | Admitting: Family Medicine

## 2022-06-01 MED ORDER — OXYCODONE HCL 30 MG PO TABS
30.0000 mg | ORAL_TABLET | Freq: Four times a day (QID) | ORAL | 0 refills | Status: DC | PRN
Start: 2022-06-01 — End: 2022-06-01

## 2022-06-01 MED ORDER — OXYCODONE HCL 30 MG PO TABS: 30.0000 mg | ORAL_TABLET | Freq: Four times a day (QID) | ORAL | 0 refills | Status: DC | PRN

## 2022-06-01 NOTE — Telephone Encounter (Signed)
I did the refills

## 2022-06-01 NOTE — Telephone Encounter (Signed)
Pt went to pharmacy for his oxycodone (ROXICODONE) 30 MG immediate release tablet [[id number]]  ENDED  and it was not there. Checking on progress of refill. States it was discussed at his PMV

## 2022-06-01 NOTE — Telephone Encounter (Signed)
Pt LOV was on 04/25/22 Last refill  done on 02/27/22 Please advise

## 2022-06-01 NOTE — Telephone Encounter (Signed)
Patient calling back checking progress of this refill. Says he is going out of town

## 2022-06-05 ENCOUNTER — Telehealth: Payer: Self-pay | Admitting: Family Medicine

## 2022-06-05 MED ORDER — OXYCODONE HCL 30 MG PO TABS
30.0000 mg | ORAL_TABLET | Freq: Four times a day (QID) | ORAL | 0 refills | Status: DC | PRN
Start: 2022-06-05 — End: 2022-06-26

## 2022-06-05 NOTE — Telephone Encounter (Signed)
Pls send this refill of oxycodone (ROXICODONE) 30 MG immediate release tablet to  [id number] [place], [place] - [address] Phone: [phone] Fax: [phone]   Original pharmacy has none in stock

## 2022-06-05 NOTE — Telephone Encounter (Signed)
[facility] [place].

## 2022-06-05 NOTE — Addendum Note (Signed)
Addended by: [name] A on: 06/05/2022 12:43 PM   Modules accepted: Orders

## 2022-06-05 NOTE — Telephone Encounter (Signed)
I sent in a 30 day supply

## 2022-06-06 ENCOUNTER — Encounter: Payer: Medicare HMO | Admitting: Licensed Clinical Social Worker

## 2022-06-06 ENCOUNTER — Ambulatory Visit: Payer: Self-pay | Admitting: Licensed Clinical Social Worker

## 2022-06-06 DIAGNOSIS — I480 Paroxysmal atrial fibrillation: Secondary | ICD-10-CM

## 2022-06-06 DIAGNOSIS — E1140 Type 2 diabetes mellitus with diabetic neuropathy, unspecified: Secondary | ICD-10-CM

## 2022-06-06 NOTE — Telephone Encounter (Signed)
error 

## 2022-06-08 NOTE — Patient Outreach (Signed)
  Care Coordination   Follow Up Visit Note   06/08/2022 Name: [name] MRN: [id number] DOB: 02-09-1941  [name] is a 81 y.o. year old male who sees Christopher Morale, MD for primary care. I spoke with  [name] by phone today.  What matters to the patients health and wellness today?  Stress management    Goals Addressed             This Visit's Progress    Managment of Stress and Obtain Supportive Resources   On track    Care Coordination Interventions: Solution-Focused Strategies employed:  Active listening / Reflection utilized  Emotional Support Provided Verbalization of feelings encouraged  Pt endorses continued back pain, negatively impacting his functioning around the home  Patient is in need of ramp and walk-in shower. LCSW has completed care guide referral in the past; however, he has not heard from anyone Pt plans to obtain an electric wheelchair to assist with mobility from friend. States it will fit through most doorways in  Pt has a cataract consultation this week. States he is unable to afford it due to his fixed income and the amount of bills he is overwhelmed with. Patient reports difficulty with eyesight at night.  LCSW informed pt of supportive resources to assist with financial strain. Will further assess at next appt             SDOH assessments and interventions completed:  No     Care Coordination Interventions Activated:  Yes  Care Coordination Interventions:  Yes, provided   Follow up plan: Follow up call scheduled for 1-2 weeks    Encounter Outcome:  Pt. Visit Completed   [name], MSW, [email or web address] Phone [phone] 5:58 AM

## 2022-06-08 NOTE — Patient Instructions (Signed)
Visit Information  Thank you for taking time to visit with me today. Please don't hesitate to contact me if I can be of assistance to you.   Following are the goals we discussed today:   Goals Addressed             This Visit's Progress    Managment of Stress and Obtain Supportive Resources   On track    Care Coordination Interventions: Solution-Focused Strategies employed:  Active listening / Reflection utilized  Emotional Support Provided Verbalization of feelings encouraged  Pt endorses continued back pain, negatively impacting his functioning around the home  Patient is in need of ramp and walk-in shower. LCSW has completed care guide referral in the past; however, he has not heard from anyone Pt plans to obtain an electric wheelchair to assist with mobility from friend. States it will fit through most doorways in  Pt has a cataract consultation this week. States he is unable to afford it due to his fixed income and the amount of bills he is overwhelmed with. Patient reports difficulty with eyesight at night.  LCSW informed pt of supportive resources to assist with financial strain. Will further assess at next appt             Our next appointment is by telephone on 06/20/22 at 3 PM  Please call the care guide team at [phone] if you need to cancel or reschedule your appointment.   If you are experiencing a Mental Health or [place] or need someone to talk to, please call the Suicide and Crisis Lifeline: 988 call 911   Patient verbalizes understanding of instructions and care plan provided today and agrees to view in [place]. Active MyChart status and patient understanding of how to access instructions and care plan via MyChart confirmed with patient.     [name], MSW, [email or web address] Phone [phone] 5:59 AM

## 2022-06-13 ENCOUNTER — Telehealth: Payer: Self-pay

## 2022-06-13 NOTE — Telephone Encounter (Signed)
   Telephone encounter was:  Successful.  06/13/2022 Name: [name] MRN: [id number] DOB: 1940-08-06  [name] is a 81 y.o. year old male who is a primary care patient of [name], MD . The community resource team was consulted for assistance with Home Modifications  Care guide performed the following interventions: Patient provided with information about care guide support team and interviewed to confirm resource needs. Patient stated he needed ramp and shower modifications to his home. I will mail the resources to the patient  Follow Up Plan:  No further follow up planned at this time. The patient has been provided with needed resources.   [place], Care Management  [phone] [address], [place], [place] [address] Phone: [phone] Email: [email or web address]

## 2022-06-18 ENCOUNTER — Encounter: Payer: Self-pay | Admitting: Family Medicine

## 2022-06-18 ENCOUNTER — Ambulatory Visit (INDEPENDENT_AMBULATORY_CARE_PROVIDER_SITE_OTHER): Payer: Medicare HMO | Admitting: Family Medicine

## 2022-06-18 VITALS — BP 140/80 | HR 73 | Temp 97.6°F | Wt 217.0 lb

## 2022-06-18 DIAGNOSIS — R400 Somnolence: Secondary | ICD-10-CM

## 2022-06-18 NOTE — Progress Notes (Signed)
   Subjective:    Patient ID: [name], male    DOB: 1940/12/27, 81 y.o.   MRN: [id number]  HPI Here asking advice about medications that make him drowsy. He knows that 3 of his medciations (Oxycodone, Ropinirol, and Gabapentin) can cause drowsiness, so he has educed his dosing to only taking these at night. Sometimes he still feels humg over in the mornings and he is hesitant to drive his car that way.    Review of Systems  Constitutional: Negative.   Respiratory: Negative.    Cardiovascular: Negative.        Objective:   Physical Exam Constitutional:      Appearance: Normal appearance.  Cardiovascular:     Rate and Rhythm: Normal rate and regular rhythm.     Pulses: Normal pulses.     Heart sounds: Normal heart sounds.  Pulmonary:     Effort: Pulmonary effort is normal.     Breath sounds: Normal breath sounds.  Neurological:     Mental Status: He is alert.           Assessment & Plan:  To reduce his daytime drowsiness, we agreed to stop the Gabapentin completely. He will stay on the other medications. Follow up as needed.  [name], MD

## 2022-06-20 ENCOUNTER — Ambulatory Visit: Payer: Self-pay | Admitting: Licensed Clinical Social Worker

## 2022-06-21 ENCOUNTER — Telehealth: Payer: Self-pay | Admitting: Family Medicine
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
## 2022-06-22 NOTE — Patient Instructions (Signed)
Visit Information  Thank you for taking time to visit with me today. Please don't hesitate to contact me if I can be of assistance to you.   Following are the goals we discussed today:   Goals Addressed             This Visit's Progress    Managment of Stress and Obtain Supportive Resources   On track    Care Coordination Interventions: Solution-Focused Strategies employed:  Active listening / Reflection utilized  Emotional Support Provided Verbalization of feelings encouraged  Pt endorses continued back pain, negatively impacting his functioning around the home  Patient is in need of ramp and walk-in shower. LCSW has completed care guide referral in the past; however, he has not heard from anyone Pt plans to obtain an electric wheelchair to assist with mobility from friend. States it will fit through most doorways in  Pt has a cataract consultation this week. States he is unable to afford it due to his fixed income and the amount of bills he is overwhelmed with. Patient reports difficulty with eyesight at night.  LCSW informed pt of supportive resources to assist with financial strain. Will further assess at next appt             Please call the care guide team at [phone] if you need to cancel or reschedule your appointment.   If you are experiencing a Mental Health or [place] or need someone to talk to, please call the Suicide and Crisis Lifeline: 988 call 911   Patient verbalizes understanding of instructions and care plan provided today and agrees to view in [place]. Active MyChart status and patient understanding of how to access instructions and care plan via MyChart confirmed with patient.     [name], MSW, [email or web address] Phone [phone] 5:42 AM

## 2022-06-22 NOTE — Telephone Encounter (Signed)
What medication is he speaking about?

## 2022-06-22 NOTE — Patient Outreach (Signed)
  Care Coordination   Follow Up Visit Note   06/22/2022 Name: [name] MRN: [id number] DOB: 1940-09-10  [name] is a 81 y.o. year old male who sees [name], MD for primary care. I spoke with  [name] by phone today.  What matters to the patients health and wellness today?  Coping skills    Goals Addressed             This Visit's Progress    Managment of Stress and Obtain Supportive Resources   On track    Care Coordination Interventions: Solution-Focused Strategies employed:  Active listening / Reflection utilized  Emotional Support Provided Verbalization of feelings encouraged  Pt endorses continued back pain, negatively impacting his functioning around the home  Patient is in need of ramp and walk-in shower. LCSW has completed care guide referral in the past; however, he has not heard from anyone Pt plans to obtain an electric wheelchair to assist with mobility from friend. States it will fit through most doorways in  Pt has a cataract consultation this week. States he is unable to afford it due to his fixed income and the amount of bills he is overwhelmed with. Patient reports difficulty with eyesight at night.  LCSW informed pt of supportive resources to assist with financial strain. Will further assess at next appt             SDOH assessments and interventions completed:  No     Care Coordination Interventions:  Yes, provided   Follow up plan: Follow up call scheduled for 4-6 weeks    Encounter Outcome:  Pt. Visit Completed   [name], MSW, [email or web address] Phone [phone] 5:41 AM

## 2022-06-22 NOTE — Telephone Encounter (Signed)
My apologies, Pt is requesting the Freestyle lite Diabetic strips

## 2022-06-25 ENCOUNTER — Telehealth: Payer: Self-pay | Admitting: Family Medicine

## 2022-06-25 NOTE — Telephone Encounter (Signed)
Pt is calling and would like oxycodone (ROXICODONE) 30 MG immediate release tablet  [id number] [place], [place] - [address] Phone: [phone] Fax: [phone]

## 2022-06-26 ENCOUNTER — Other Ambulatory Visit: Payer: Self-pay

## 2022-06-26 ENCOUNTER — Other Ambulatory Visit: Payer: Self-pay | Admitting: Family Medicine

## 2022-06-26 DIAGNOSIS — E1140 Type 2 diabetes mellitus with diabetic neuropathy, unspecified: Secondary | ICD-10-CM

## 2022-06-26 MED ORDER — FREESTYLE LITE TEST VI STRP: ORAL_STRIP | 3 refills | Status: DC

## 2022-06-26 MED ORDER — OXYCODONE HCL 30 MG PO TABS: 30.0000 mg | ORAL_TABLET | Freq: Four times a day (QID) | ORAL | 0 refills | Status: AC | PRN

## 2022-06-26 NOTE — Telephone Encounter (Signed)
Refilled for 30 days. He will need a PMV in January

## 2022-06-26 NOTE — Telephone Encounter (Addendum)
Last refill-06/05/22--120 tabs, 0 refills Last OV-06/12/22  No future OV scheduled.

## 2022-06-26 NOTE — Telephone Encounter (Signed)
Please order these test strips

## 2022-06-26 NOTE — Telephone Encounter (Signed)
Prescription for Freestyle Lite Diabetic test [facility] [place].

## 2022-06-27 ENCOUNTER — Telehealth: Payer: Self-pay | Admitting: Cardiovascular Disease

## 2022-06-27 LAB — CUP PACEART REMOTE DEVICE CHECK
Battery Remaining Longevity: 89 mo
Battery Voltage: 2.97 V
Brady Statistic AP VP Percent: 14.75 %
Brady Statistic AP VS Percent: 16.06 %
Brady Statistic AS VP Percent: 21.34 %
Brady Statistic AS VS Percent: 47.85 %
Brady Statistic RA Percent Paced: 33.17 %
Brady Statistic RV Percent Paced: 36.34 %
Date Time Interrogation Session: 20231205235954
Implantable Lead Connection Status: 753985
Implantable Lead Connection Status: 753985
Implantable Lead Implant Date: 20180604
Implantable Lead Implant Date: 20180604
Implantable Lead Location: 753859
Implantable Lead Location: 753860
Implantable Lead Model: 5076
Implantable Lead Model: 5076
Implantable Pulse Generator Implant Date: 20180604
Lead Channel Impedance Value: 285 Ohm
Lead Channel Impedance Value: 323 Ohm
Lead Channel Impedance Value: 380 Ohm
Lead Channel Impedance Value: 418 Ohm
Lead Channel Pacing Threshold Amplitude: 0.75 V
Lead Channel Pacing Threshold Amplitude: 1 V
Lead Channel Pacing Threshold Pulse Width: 0.4 ms
Lead Channel Pacing Threshold Pulse Width: 0.4 ms
Lead Channel Sensing Intrinsic Amplitude: 3.125 mV
Lead Channel Sensing Intrinsic Amplitude: 3.125 mV
Lead Channel Sensing Intrinsic Amplitude: 4.5 mV
Lead Channel Sensing Intrinsic Amplitude: 4.5 mV
Lead Channel Setting Pacing Amplitude: 1.5 V
Lead Channel Setting Pacing Amplitude: 2.5 V
Lead Channel Setting Pacing Pulse Width: 0.4 ms
Lead Channel Setting Sensing Sensitivity: 1.2 mV
Zone Setting Status: 755011
Zone Setting Status: 755011

## 2022-06-27 MED ORDER — CLOPIDOGREL BISULFATE 75 MG PO TABS
75.0000 mg | ORAL_TABLET | Freq: Every day | ORAL | 3 refills | Status: AC
Start: 2022-06-27 — End: ?

## 2022-06-27 NOTE — Telephone Encounter (Signed)
  Pt c/o medication issue:  1. Name of Medication:   clopidogrel (PLAVIX) 75 MG tablet    2. How are you currently taking this medication (dosage and times per day)? Take 1 tablet (75 mg total) by mouth daily.   3. Are you having a reaction (difficulty breathing--STAT)? No   4. What is your medication issue? Pt said, Dr. Gonzella Lex changed his dose to take 1 tablet twice a day, one ine the morning and one at night. He is out of meds and his pharmacy won't fill it. He said he needs new prescription sent to his pharmacy

## 2022-06-27 NOTE — Telephone Encounter (Signed)
Called patient informed of medication sent to the pharmacy  also before next refill PMV required in Jan of 2024.     Patient discussed that he had fallen 3 weeks ago and is now experiencing right hip pain, low back pain.     He has also been experiencing extreme weakness and extreme drowsiness, patient stated that he feels some of his medications may need to be adjusted.      Patient requested office visit appointment for 2022-07-07.     Appointment scheduled for Jul 07, 2022 for 15 mins time slot.   Please advise if appointment should be scheduled for 30 min slot.

## 2022-06-27 NOTE — Telephone Encounter (Signed)
Called and let the patient know to call back if he needs [place] to send Plavix to [facility] instead.

## 2022-06-27 NOTE — Telephone Encounter (Signed)
Yes he should change Plavix to once daily. Main concern with BID dosing would be increased bleeding risk since he's also on Eliquis - confirm he's taking that twice daily, maybe he mixed the two up?

## 2022-06-27 NOTE — Telephone Encounter (Signed)
Called patient and confirmed that he has taken Plavix twice daily for the past 45 days.  He also is taking Eliquis BID.  I adv to begin tomorrow taking 75 mg once daily and that I would [facility] a [facility] pres[facility].   [facility] told him it would be 45 days before insurance will cover more.  He will pay cash for it in the meantime.  Has had no blood in stool or urine that he could see, no gums or nose bleeding.  Will call if develops any.

## 2022-06-27 NOTE — Telephone Encounter (Signed)
A 15 minute appt would be fine

## 2022-06-27 NOTE — Telephone Encounter (Signed)
Noted  

## 2022-06-27 NOTE — Telephone Encounter (Signed)
30 tablets of clopidogrel cost $15 from [facility] if the cost at his [facility] ends up being notably higher than this price.

## 2022-06-28 ENCOUNTER — Other Ambulatory Visit: Payer: Self-pay

## 2022-06-28 DIAGNOSIS — E1140 Type 2 diabetes mellitus with diabetic neuropathy, unspecified: Secondary | ICD-10-CM

## 2022-06-28 MED ORDER — GLUCOSE BLOOD VI STRP: ORAL_STRIP | 2 refills | Status: AC

## 2022-06-29 DIAGNOSIS — H1045 Other chronic allergic conjunctivitis: Secondary | ICD-10-CM | POA: Diagnosis not present

## 2022-07-01 ENCOUNTER — Inpatient Hospital Stay (HOSPITAL_BASED_OUTPATIENT_CLINIC_OR_DEPARTMENT_OTHER)
Admission: EM | Admit: 2022-07-01 | Discharge: 2022-07-23 | DRG: 189 | Disposition: E | Payer: Medicare HMO | Attending: Family Medicine | Admitting: Family Medicine

## 2022-07-01 ENCOUNTER — Other Ambulatory Visit: Payer: Self-pay

## 2022-07-01 ENCOUNTER — Emergency Department (HOSPITAL_BASED_OUTPATIENT_CLINIC_OR_DEPARTMENT_OTHER): Payer: Medicare HMO

## 2022-07-01 ENCOUNTER — Encounter (HOSPITAL_BASED_OUTPATIENT_CLINIC_OR_DEPARTMENT_OTHER): Payer: Self-pay | Admitting: Emergency Medicine

## 2022-07-01 ENCOUNTER — Emergency Department (HOSPITAL_BASED_OUTPATIENT_CLINIC_OR_DEPARTMENT_OTHER): Payer: Medicare HMO | Admitting: Radiology

## 2022-07-01 ENCOUNTER — Encounter (HOSPITAL_COMMUNITY): Payer: Self-pay

## 2022-07-01 DIAGNOSIS — Z823 Family history of stroke: Secondary | ICD-10-CM

## 2022-07-01 DIAGNOSIS — I5033 Acute on chronic diastolic (congestive) heart failure: Secondary | ICD-10-CM | POA: Diagnosis present

## 2022-07-01 DIAGNOSIS — Z8249 Family history of ischemic heart disease and other diseases of the circulatory system: Secondary | ICD-10-CM

## 2022-07-01 DIAGNOSIS — I472 Ventricular tachycardia, unspecified: Secondary | ICD-10-CM | POA: Diagnosis present

## 2022-07-01 DIAGNOSIS — I495 Sick sinus syndrome: Secondary | ICD-10-CM | POA: Diagnosis present

## 2022-07-01 DIAGNOSIS — E039 Hypothyroidism, unspecified: Secondary | ICD-10-CM | POA: Diagnosis present

## 2022-07-01 DIAGNOSIS — E1142 Type 2 diabetes mellitus with diabetic polyneuropathy: Secondary | ICD-10-CM | POA: Diagnosis present

## 2022-07-01 DIAGNOSIS — I252 Old myocardial infarction: Secondary | ICD-10-CM

## 2022-07-01 DIAGNOSIS — J9621 Acute and chronic respiratory failure with hypoxia: Secondary | ICD-10-CM | POA: Diagnosis present

## 2022-07-01 DIAGNOSIS — I482 Chronic atrial fibrillation, unspecified: Secondary | ICD-10-CM | POA: Diagnosis present

## 2022-07-01 DIAGNOSIS — K219 Gastro-esophageal reflux disease without esophagitis: Secondary | ICD-10-CM | POA: Diagnosis present

## 2022-07-01 DIAGNOSIS — J90 Pleural effusion, not elsewhere classified: Secondary | ICD-10-CM | POA: Diagnosis not present

## 2022-07-01 DIAGNOSIS — I110 Hypertensive heart disease with heart failure: Secondary | ICD-10-CM | POA: Diagnosis present

## 2022-07-01 DIAGNOSIS — Z66 Do not resuscitate: Secondary | ICD-10-CM | POA: Diagnosis present

## 2022-07-01 DIAGNOSIS — I4892 Unspecified atrial flutter: Secondary | ICD-10-CM | POA: Diagnosis present

## 2022-07-01 DIAGNOSIS — J690 Pneumonitis due to inhalation of food and vomit: Secondary | ICD-10-CM | POA: Diagnosis present

## 2022-07-01 DIAGNOSIS — S99922A Unspecified injury of left foot, initial encounter: Secondary | ICD-10-CM | POA: Diagnosis present

## 2022-07-01 DIAGNOSIS — Z79899 Other long term (current) drug therapy: Secondary | ICD-10-CM

## 2022-07-01 DIAGNOSIS — I2510 Atherosclerotic heart disease of native coronary artery without angina pectoris: Secondary | ICD-10-CM | POA: Diagnosis present

## 2022-07-01 DIAGNOSIS — S0990XA Unspecified injury of head, initial encounter: Secondary | ICD-10-CM | POA: Diagnosis not present

## 2022-07-01 DIAGNOSIS — Z87891 Personal history of nicotine dependence: Secondary | ICD-10-CM

## 2022-07-01 DIAGNOSIS — Z955 Presence of coronary angioplasty implant and graft: Secondary | ICD-10-CM

## 2022-07-01 DIAGNOSIS — E662 Morbid (severe) obesity with alveolar hypoventilation: Secondary | ICD-10-CM

## 2022-07-01 DIAGNOSIS — Z7189 Other specified counseling: Secondary | ICD-10-CM

## 2022-07-01 DIAGNOSIS — Z7901 Long term (current) use of anticoagulants: Secondary | ICD-10-CM

## 2022-07-01 DIAGNOSIS — G9341 Metabolic encephalopathy: Secondary | ICD-10-CM | POA: Diagnosis present

## 2022-07-01 DIAGNOSIS — I10 Essential (primary) hypertension: Secondary | ICD-10-CM | POA: Diagnosis present

## 2022-07-01 DIAGNOSIS — Z881 Allergy status to other antibiotic agents status: Secondary | ICD-10-CM

## 2022-07-01 DIAGNOSIS — Z515 Encounter for palliative care: Secondary | ICD-10-CM

## 2022-07-01 DIAGNOSIS — G8929 Other chronic pain: Secondary | ICD-10-CM | POA: Diagnosis present

## 2022-07-01 DIAGNOSIS — I441 Atrioventricular block, second degree: Secondary | ICD-10-CM | POA: Diagnosis not present

## 2022-07-01 DIAGNOSIS — G4700 Insomnia, unspecified: Secondary | ICD-10-CM | POA: Diagnosis present

## 2022-07-01 DIAGNOSIS — Z888 Allergy status to other drugs, medicaments and biological substances status: Secondary | ICD-10-CM

## 2022-07-01 DIAGNOSIS — Z6831 Body mass index (BMI) 31.0-31.9, adult: Secondary | ICD-10-CM | POA: Diagnosis not present

## 2022-07-01 DIAGNOSIS — R55 Syncope and collapse: Secondary | ICD-10-CM | POA: Diagnosis present

## 2022-07-01 DIAGNOSIS — M47812 Spondylosis without myelopathy or radiculopathy, cervical region: Secondary | ICD-10-CM | POA: Diagnosis not present

## 2022-07-01 DIAGNOSIS — G2581 Restless legs syndrome: Secondary | ICD-10-CM | POA: Diagnosis present

## 2022-07-01 DIAGNOSIS — W208XXA Other cause of strike by thrown, projected or falling object, initial encounter: Secondary | ICD-10-CM | POA: Diagnosis present

## 2022-07-01 DIAGNOSIS — J9622 Acute and chronic respiratory failure with hypercapnia: Secondary | ICD-10-CM

## 2022-07-01 DIAGNOSIS — E1140 Type 2 diabetes mellitus with diabetic neuropathy, unspecified: Secondary | ICD-10-CM | POA: Diagnosis present

## 2022-07-01 DIAGNOSIS — G4733 Obstructive sleep apnea (adult) (pediatric): Secondary | ICD-10-CM | POA: Diagnosis present

## 2022-07-01 DIAGNOSIS — Z91018 Allergy to other foods: Secondary | ICD-10-CM

## 2022-07-01 DIAGNOSIS — I480 Paroxysmal atrial fibrillation: Secondary | ICD-10-CM | POA: Diagnosis present

## 2022-07-01 DIAGNOSIS — I5030 Unspecified diastolic (congestive) heart failure: Secondary | ICD-10-CM | POA: Diagnosis present

## 2022-07-01 DIAGNOSIS — E785 Hyperlipidemia, unspecified: Secondary | ICD-10-CM | POA: Diagnosis present

## 2022-07-01 DIAGNOSIS — Z86010 Personal history of colonic polyps: Secondary | ICD-10-CM

## 2022-07-01 DIAGNOSIS — Z6833 Body mass index (BMI) 33.0-33.9, adult: Secondary | ICD-10-CM

## 2022-07-01 DIAGNOSIS — Z950 Presence of cardiac pacemaker: Secondary | ICD-10-CM | POA: Diagnosis not present

## 2022-07-01 DIAGNOSIS — Z5320 Procedure and treatment not carried out because of patient's decision for unspecified reasons: Secondary | ICD-10-CM | POA: Diagnosis present

## 2022-07-01 DIAGNOSIS — Z91013 Allergy to seafood: Secondary | ICD-10-CM

## 2022-07-01 DIAGNOSIS — M7989 Other specified soft tissue disorders: Secondary | ICD-10-CM | POA: Diagnosis not present

## 2022-07-01 DIAGNOSIS — Z043 Encounter for examination and observation following other accident: Secondary | ICD-10-CM | POA: Diagnosis not present

## 2022-07-01 DIAGNOSIS — Z7902 Long term (current) use of antithrombotics/antiplatelets: Secondary | ICD-10-CM

## 2022-07-01 DIAGNOSIS — M79672 Pain in left foot: Secondary | ICD-10-CM | POA: Diagnosis present

## 2022-07-01 DIAGNOSIS — Z806 Family history of leukemia: Secondary | ICD-10-CM

## 2022-07-01 DIAGNOSIS — Z79890 Hormone replacement therapy (postmenopausal): Secondary | ICD-10-CM

## 2022-07-01 DIAGNOSIS — R102 Pelvic and perineal pain: Secondary | ICD-10-CM | POA: Diagnosis not present

## 2022-07-01 LAB — PROTIME-INR
INR: 1.1 (ref 0.8–1.2)
Prothrombin Time: 14.3 seconds (ref 11.4–15.2)

## 2022-07-01 LAB — MAGNESIUM: Magnesium: 1.9 mg/dL (ref 1.7–2.4)

## 2022-07-01 LAB — CBC WITH DIFFERENTIAL/PLATELET
Abs Immature Granulocytes: 0.1 10*3/uL — ABNORMAL HIGH (ref 0.00–0.07)
Basophils Absolute: 0.1 10*3/uL (ref 0.0–0.1)
Basophils Relative: 1 %
Eosinophils Absolute: 0.5 10*3/uL (ref 0.0–0.5)
Eosinophils Relative: 5 %
HCT: 39.6 % (ref 39.0–52.0)
Hemoglobin: 11.7 g/dL — ABNORMAL LOW (ref 13.0–17.0)
Immature Granulocytes: 1 %
Lymphocytes Relative: 23 %
Lymphs Abs: 2.4 10*3/uL (ref 0.7–4.0)
MCH: 24.7 pg — ABNORMAL LOW (ref 26.0–34.0)
MCHC: 29.5 g/dL — ABNORMAL LOW (ref 30.0–36.0)
MCV: 83.7 fL (ref 80.0–100.0)
Monocytes Absolute: 1.2 10*3/uL — ABNORMAL HIGH (ref 0.1–1.0)
Monocytes Relative: 11 %
Neutro Abs: 6.2 10*3/uL (ref 1.7–7.7)
Neutrophils Relative %: 59 %
Platelets: 289 10*3/uL (ref 150–400)
RBC: 4.73 MIL/uL (ref 4.22–5.81)
RDW: 16.4 % — ABNORMAL HIGH (ref 11.5–15.5)
WBC: 10.5 10*3/uL (ref 4.0–10.5)
nRBC: 0 % (ref 0.0–0.2)

## 2022-07-01 LAB — TROPONIN I (HIGH SENSITIVITY)
Troponin I (High Sensitivity): 6 ng/L (ref <18)
Troponin I (High Sensitivity): 6 ng/L

## 2022-07-01 LAB — BASIC METABOLIC PANEL
Anion gap: 11 (ref 5–15)
BUN: 17 mg/dL (ref 8–23)
CO2: 27 mmol/L (ref 22–32)
Calcium: 9.3 mg/dL (ref 8.9–10.3)
Chloride: 104 mmol/L (ref 98–111)
Creatinine, Ser: 0.88 mg/dL (ref 0.61–1.24)
GFR, Estimated: >60 mL/min (ref >60)
Glucose, Bld: 103 mg/dL — ABNORMAL HIGH (ref 70–99)
Potassium: 4.1 mmol/L (ref 3.5–5.1)
Sodium: 142 mmol/L (ref 135–145)

## 2022-07-01 LAB — BRAIN NATRIURETIC PEPTIDE: B Natriuretic Peptide: 42.4 pg/mL (ref 0.0–100.0)

## 2022-07-01 MED ORDER — ONDANSETRON HCL 4 MG/2ML IJ SOLN
4.0000 mg | Freq: Four times a day (QID) | INTRAMUSCULAR | Status: DC | PRN
  Administered 2022-07-03 – 2022-07-04 (×2): 4 mg via INTRAVENOUS
  Filled 2022-07-01 (×2): qty 2

## 2022-07-01 MED ORDER — SODIUM CHLORIDE 0.9% FLUSH
3.0000 mL | Freq: Two times a day (BID) | INTRAVENOUS | Status: DC
  Administered 2022-07-01 – 2022-07-05 (×8): 3 mL via INTRAVENOUS

## 2022-07-01 MED ORDER — ENOXAPARIN SODIUM 40 MG/0.4ML IJ SOSY
40.0000 mg | PREFILLED_SYRINGE | Freq: Every day | INTRAMUSCULAR | Status: DC
  Filled 2022-07-01: qty 0.4

## 2022-07-01 MED ORDER — ACETAMINOPHEN 650 MG RE SUPP: 650.0000 mg | Freq: Four times a day (QID) | RECTAL | Status: DC | PRN

## 2022-07-01 MED ORDER — APIXABAN 5 MG PO TABS
5.0000 mg | ORAL_TABLET | Freq: Two times a day (BID) | ORAL | Status: DC
  Administered 2022-07-01 – 2022-07-05 (×8): 5 mg via ORAL
  Filled 2022-07-01 (×4): qty 1
  Filled 2022-07-01: qty 2
  Filled 2022-07-01: qty 1
  Filled 2022-07-01: qty 2
  Filled 2022-07-01: qty 1

## 2022-07-01 MED ORDER — OXYCODONE HCL 5 MG PO TABS
5.0000 mg | ORAL_TABLET | ORAL | Status: DC | PRN
  Administered 2022-07-01 – 2022-07-02 (×3): 5 mg via ORAL
  Filled 2022-07-01 (×3): qty 1

## 2022-07-01 MED ORDER — ACETAMINOPHEN 325 MG PO TABS
650.0000 mg | ORAL_TABLET | Freq: Four times a day (QID) | ORAL | Status: DC | PRN
  Administered 2022-07-03 – 2022-07-05 (×3): 650 mg via ORAL
  Filled 2022-07-01 (×3): qty 2

## 2022-07-01 MED ORDER — ONDANSETRON HCL 4 MG PO TABS: 4.0000 mg | ORAL_TABLET | Freq: Four times a day (QID) | ORAL | Status: DC | PRN

## 2022-07-01 NOTE — ED Notes (Signed)
Patient c/o leg cramps . Patient requests to stand and bedside to relief.
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
## 2022-07-01 NOTE — ED Notes (Signed)
IV unsuccessful.... Different RN will attempt.

## 2022-07-01 NOTE — Assessment & Plan Note (Signed)
Unable to tolerate CPAP 

## 2022-07-01 NOTE — ED Notes (Signed)
Patient transported to CT 
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
## 2022-07-01 NOTE — Assessment & Plan Note (Signed)
-  Patient reports recurrent episodes of syncope, some at rest and others with exertion -Episodes of syncope are brief, lasting only seconds -Yesterday's episode was while he was making coffee and resulted in him landing in the floor -Given his bradycardia and Wenkebach on presentation as well as multiple runs of NSVT overnight, advanced heart block is concerning and cardiogenic syncope appears most likely -This patient is at moderate/high risk for serious outcome and thus should be observed overnight on telemetry in the hospital. -Orthostatic vital signs done -Trending troponins x 2 -Head, neck CT unremarkable -Neuro checks  -PT/OT eval and treat -Consider loop recorder if syncope has been recurrent, rare, and workup including event monitor has not been diagnostic; this is the gold standard in recurrent, unexplained syncope. -Cardiology is consulted

## 2022-07-01 NOTE — Assessment & Plan Note (Signed)
Continue Synthroid °

## 2022-07-01 NOTE — ED Notes (Addendum)
12 beat run Vtach at [employer]. Provider notified Armandina Gemma)

## 2022-07-01 NOTE — Assessment & Plan Note (Signed)
-  Patient with chronic afib -Continue home diltiazem -Continue Eliquis -Interrogation of pacemaker doesn't show apparent bradycardia, although he was reported to be as low as 38 in the ER

## 2022-07-01 NOTE — ED Triage Notes (Addendum)
Pt presents to ED POV. Pt c/o fall and posterior head trauma. Pt reports that he doesn't know how he fell he was making coffee and then woke up on the floor several hours ago. Has a pacemaker hr 26 in triage. Taking eliquis and plavix per pt
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
## 2022-07-01 NOTE — Assessment & Plan Note (Signed)
-  01/2022 echo with preserved EF and grade 1 diastolic dysfunction -Appears to be compensated at this time

## 2022-07-01 NOTE — Assessment & Plan Note (Signed)
-  Continue Imdur, Ranexa, Plavix (has been taking 150 mg instead of '75mg'$  so ran out early and cannot get rx currently, per med rec) -No report of CP before, during, or after admissions

## 2022-07-01 NOTE — Assessment & Plan Note (Signed)
Continue diltiazem. 

## 2022-07-02 ENCOUNTER — Telehealth: Payer: Self-pay | Admitting: Pharmacist

## 2022-07-02 ENCOUNTER — Other Ambulatory Visit: Payer: Self-pay

## 2022-07-02 DIAGNOSIS — E662 Morbid (severe) obesity with alveolar hypoventilation: Secondary | ICD-10-CM | POA: Diagnosis not present

## 2022-07-02 DIAGNOSIS — I2510 Atherosclerotic heart disease of native coronary artery without angina pectoris: Secondary | ICD-10-CM | POA: Diagnosis not present

## 2022-07-02 DIAGNOSIS — Z515 Encounter for palliative care: Secondary | ICD-10-CM | POA: Diagnosis not present

## 2022-07-02 DIAGNOSIS — J9622 Acute and chronic respiratory failure with hypercapnia: Secondary | ICD-10-CM | POA: Diagnosis not present

## 2022-07-02 DIAGNOSIS — E039 Hypothyroidism, unspecified: Secondary | ICD-10-CM | POA: Diagnosis not present

## 2022-07-02 DIAGNOSIS — E1142 Type 2 diabetes mellitus with diabetic polyneuropathy: Secondary | ICD-10-CM | POA: Diagnosis not present

## 2022-07-02 DIAGNOSIS — W208XXA Other cause of strike by thrown, projected or falling object, initial encounter: Secondary | ICD-10-CM | POA: Diagnosis not present

## 2022-07-02 DIAGNOSIS — E785 Hyperlipidemia, unspecified: Secondary | ICD-10-CM | POA: Diagnosis not present

## 2022-07-02 DIAGNOSIS — I480 Paroxysmal atrial fibrillation: Secondary | ICD-10-CM | POA: Diagnosis not present

## 2022-07-02 DIAGNOSIS — Z950 Presence of cardiac pacemaker: Secondary | ICD-10-CM | POA: Diagnosis not present

## 2022-07-02 DIAGNOSIS — J690 Pneumonitis due to inhalation of food and vomit: Secondary | ICD-10-CM | POA: Diagnosis not present

## 2022-07-02 DIAGNOSIS — J9621 Acute and chronic respiratory failure with hypoxia: Secondary | ICD-10-CM | POA: Diagnosis not present

## 2022-07-02 DIAGNOSIS — I482 Chronic atrial fibrillation, unspecified: Secondary | ICD-10-CM | POA: Diagnosis not present

## 2022-07-02 DIAGNOSIS — Z87891 Personal history of nicotine dependence: Secondary | ICD-10-CM | POA: Diagnosis not present

## 2022-07-02 DIAGNOSIS — I4892 Unspecified atrial flutter: Secondary | ICD-10-CM | POA: Diagnosis not present

## 2022-07-02 DIAGNOSIS — I110 Hypertensive heart disease with heart failure: Secondary | ICD-10-CM | POA: Diagnosis not present

## 2022-07-02 DIAGNOSIS — G8929 Other chronic pain: Secondary | ICD-10-CM | POA: Diagnosis not present

## 2022-07-02 DIAGNOSIS — R55 Syncope and collapse: Secondary | ICD-10-CM | POA: Diagnosis not present

## 2022-07-02 DIAGNOSIS — I10 Essential (primary) hypertension: Secondary | ICD-10-CM | POA: Diagnosis not present

## 2022-07-02 DIAGNOSIS — I495 Sick sinus syndrome: Secondary | ICD-10-CM | POA: Diagnosis not present

## 2022-07-02 DIAGNOSIS — G9341 Metabolic encephalopathy: Secondary | ICD-10-CM | POA: Diagnosis not present

## 2022-07-02 DIAGNOSIS — I472 Ventricular tachycardia, unspecified: Secondary | ICD-10-CM | POA: Diagnosis not present

## 2022-07-02 DIAGNOSIS — G4733 Obstructive sleep apnea (adult) (pediatric): Secondary | ICD-10-CM | POA: Diagnosis not present

## 2022-07-02 DIAGNOSIS — I5033 Acute on chronic diastolic (congestive) heart failure: Secondary | ICD-10-CM | POA: Diagnosis not present

## 2022-07-02 DIAGNOSIS — Z66 Do not resuscitate: Secondary | ICD-10-CM | POA: Diagnosis not present

## 2022-07-02 DIAGNOSIS — Z6833 Body mass index (BMI) 33.0-33.9, adult: Secondary | ICD-10-CM | POA: Diagnosis not present

## 2022-07-02 DIAGNOSIS — G2581 Restless legs syndrome: Secondary | ICD-10-CM | POA: Diagnosis not present

## 2022-07-02 LAB — BASIC METABOLIC PANEL
Anion gap: 7 (ref 5–15)
BUN: 15 mg/dL (ref 8–23)
CO2: 29 mmol/L (ref 22–32)
Calcium: 8.9 mg/dL (ref 8.9–10.3)
Chloride: 102 mmol/L (ref 98–111)
Creatinine, Ser: 0.88 mg/dL (ref 0.61–1.24)
GFR, Estimated: >60 mL/min (ref >60)
Glucose, Bld: 112 mg/dL — ABNORMAL HIGH (ref 70–99)
Potassium: 4.1 mmol/L (ref 3.5–5.1)
Sodium: 138 mmol/L (ref 135–145)

## 2022-07-02 LAB — CBC
HCT: 40.1 % (ref 39.0–52.0)
Hemoglobin: 11.9 g/dL — ABNORMAL LOW (ref 13.0–17.0)
MCH: 24.8 pg — ABNORMAL LOW (ref 26.0–34.0)
MCHC: 29.7 g/dL — ABNORMAL LOW (ref 30.0–36.0)
MCV: 83.7 fL (ref 80.0–100.0)
Platelets: 311 10*3/uL (ref 150–400)
RBC: 4.79 MIL/uL (ref 4.22–5.81)
RDW: 16.4 % — ABNORMAL HIGH (ref 11.5–15.5)
WBC: 11.3 10*3/uL — ABNORMAL HIGH (ref 4.0–10.5)
nRBC: 0 % (ref 0.0–0.2)

## 2022-07-02 LAB — TSH: TSH: 2.218 u[IU]/mL (ref 0.350–4.500)

## 2022-07-02 MED ORDER — PANTOPRAZOLE SODIUM 40 MG PO TBEC
40.0000 mg | DELAYED_RELEASE_TABLET | Freq: Every day | ORAL | Status: DC
  Administered 2022-07-02 – 2022-07-05 (×3): 40 mg via ORAL
  Filled 2022-07-02 (×4): qty 1

## 2022-07-02 MED ORDER — ORAL CARE MOUTH RINSE: 15.0000 mL | OROMUCOSAL | Status: DC | PRN

## 2022-07-02 MED ORDER — LEVOTHYROXINE SODIUM 75 MCG PO TABS
175.0000 ug | ORAL_TABLET | Freq: Every day | ORAL | Status: DC
  Administered 2022-07-03 – 2022-07-04 (×2): 175 ug via ORAL
  Filled 2022-07-02 (×2): qty 1

## 2022-07-02 MED ORDER — ALBUTEROL SULFATE (2.5 MG/3ML) 0.083% IN NEBU: 3.0000 mL | INHALATION_SOLUTION | Freq: Four times a day (QID) | RESPIRATORY_TRACT | Status: DC | PRN

## 2022-07-02 MED ORDER — DILTIAZEM HCL ER COATED BEADS 120 MG PO CP24
120.0000 mg | ORAL_CAPSULE | Freq: Every day | ORAL | Status: DC
  Administered 2022-07-02 – 2022-07-05 (×4): 120 mg via ORAL
  Filled 2022-07-02 (×4): qty 1

## 2022-07-02 MED ORDER — ROPINIROLE HCL 1 MG PO TABS: 10.0000 mg | ORAL_TABLET | Freq: Two times a day (BID) | ORAL | Status: DC

## 2022-07-02 MED ORDER — MAGNESIUM OXIDE -MG SUPPLEMENT 400 (240 MG) MG PO TABS
400.0000 mg | ORAL_TABLET | Freq: Every day | ORAL | Status: DC
  Administered 2022-07-02 – 2022-07-05 (×3): 400 mg via ORAL
  Filled 2022-07-02 (×4): qty 1

## 2022-07-02 MED ORDER — CLOPIDOGREL BISULFATE 75 MG PO TABS
75.0000 mg | ORAL_TABLET | Freq: Every day | ORAL | Status: DC
  Administered 2022-07-02 – 2022-07-05 (×4): 75 mg via ORAL
  Filled 2022-07-02 (×4): qty 1

## 2022-07-02 MED ORDER — VALACYCLOVIR HCL 500 MG PO TABS
500.0000 mg | ORAL_TABLET | Freq: Every morning | ORAL | Status: DC
  Administered 2022-07-03 – 2022-07-05 (×3): 500 mg via ORAL
  Filled 2022-07-02 (×3): qty 1

## 2022-07-02 MED ORDER — CARBOXYMETHYLCELLULOSE SOD PF 0.5 % OP SOLN: 1.0000 [drp] | Freq: Four times a day (QID) | OPHTHALMIC | Status: DC | PRN

## 2022-07-02 MED ORDER — METHOCARBAMOL 1000 MG/10ML IJ SOLN
1000.0000 mg | Freq: Four times a day (QID) | INTRAVENOUS | Status: DC | PRN
  Administered 2022-07-02: 1000 mg via INTRAVENOUS
  Filled 2022-07-02: qty 10

## 2022-07-02 MED ORDER — ISOSORBIDE MONONITRATE ER 60 MG PO TB24
90.0000 mg | ORAL_TABLET | Freq: Every day | ORAL | Status: DC
  Administered 2022-07-02 – 2022-07-05 (×3): 90 mg via ORAL
  Filled 2022-07-02 (×4): qty 1

## 2022-07-02 MED ORDER — ROPINIROLE HCL 0.5 MG PO TABS: 5.0000 mg | ORAL_TABLET | Freq: Three times a day (TID) | ORAL | Status: DC

## 2022-07-02 MED ORDER — ROPINIROLE HCL 1 MG PO TABS
10.0000 mg | ORAL_TABLET | Freq: Two times a day (BID) | ORAL | Status: DC
  Administered 2022-07-02 – 2022-07-05 (×4): 10 mg via ORAL
  Filled 2022-07-02 (×8): qty 10

## 2022-07-02 MED ORDER — MORPHINE SULFATE (PF) 2 MG/ML IV SOLN
2.0000 mg | INTRAVENOUS | Status: DC | PRN
  Administered 2022-07-02 (×3): 2 mg via INTRAVENOUS
  Filled 2022-07-02 (×3): qty 1

## 2022-07-02 MED ORDER — OXYCODONE HCL 5 MG PO TABS
15.0000 mg | ORAL_TABLET | Freq: Four times a day (QID) | ORAL | Status: DC | PRN
  Administered 2022-07-02 (×2): 15 mg via ORAL
  Filled 2022-07-02 (×3): qty 3

## 2022-07-02 MED ORDER — OXYCODONE HCL 5 MG PO TABS
30.0000 mg | ORAL_TABLET | Freq: Four times a day (QID) | ORAL | Status: DC | PRN
  Administered 2022-07-02: 30 mg via ORAL
  Filled 2022-07-02 (×2): qty 6

## 2022-07-02 MED ORDER — RANOLAZINE ER 500 MG PO TB12
500.0000 mg | ORAL_TABLET | Freq: Two times a day (BID) | ORAL | Status: DC
  Administered 2022-07-02 – 2022-07-05 (×5): 500 mg via ORAL
  Filled 2022-07-02 (×9): qty 1

## 2022-07-02 MED ORDER — POLYVINYL ALCOHOL 1.4 % OP SOLN: 1.0000 [drp] | Freq: Four times a day (QID) | OPHTHALMIC | Status: DC | PRN

## 2022-07-02 NOTE — ED Notes (Signed)
Patient reports pain while laying in stretcher. Provided a recliner and help reposition patient to comfort. Pain medications administered per order.
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
## 2022-07-02 NOTE — Progress Notes (Signed)
Created in error

## 2022-07-02 NOTE — ED Notes (Signed)
Pt stated that he was fine with being transferred to [facility]... Sign pad was not working  in the room.

## 2022-07-02 NOTE — Assessment & Plan Note (Signed)
-  I have reviewed this patient in the [place] Controlled Substances Reporting System.  He is receiving medications from only one provider and appears to be taking them as prescribed. -He is not at particularly high risk of overdose but is at increased risk of opioid misuse or diversion. -Home oxycodone was continued. -Based on his severe cramping upon arrival, he was also ordered Robaxin and morphine for breakthrough pain.

## 2022-07-02 NOTE — ED Notes (Signed)
Report given to [facility]. 

## 2022-07-02 NOTE — Assessment & Plan Note (Signed)
-  Prior A1c was 6.3, indicating good control -He does not appear to be on medications for this issue currently -Will not plan to cover with SSI at this time, unless labs indicate a need

## 2022-07-02 NOTE — Progress Notes (Signed)
Patient transferred from [facility] via [facility] at 1300hrs.  Oriented to room and plan of care for shift.  Stating he is having 10 out of 10 back pain and unable to sit down.  Noted to be unsteady on feet and reviewed fall risk precautions with patient.  Dr. Lorin Mercy here to see patient and orders for pain medication written.

## 2022-07-02 NOTE — Progress Notes (Signed)
OT Cancellation Note  Patient Details Name: [name] MRN: [id number] DOB: 05-21-1941   Cancelled Treatment:    Reason Eval/Treat Not Completed: Pain limiting ability to participate (Per PT, pt having painful hip spasms. OT evaluation to f/u when appropriate.)  Elliot Cousin 07/02/2022, 2:10 PM
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
## 2022-07-02 NOTE — ED Notes (Signed)
Report given to the floor RN.

## 2022-07-02 NOTE — Assessment & Plan Note (Addendum)
-  He does not appear to be taking medications for this issue at this time  -He has a reported intolerance to statins due to myalgias -Suggest outpatient lipid clinic referral

## 2022-07-03 ENCOUNTER — Inpatient Hospital Stay (HOSPITAL_COMMUNITY): Payer: Medicare HMO

## 2022-07-03 ENCOUNTER — Telehealth: Payer: Self-pay | Admitting: Licensed Clinical Social Worker

## 2022-07-03 DIAGNOSIS — G9341 Metabolic encephalopathy: Secondary | ICD-10-CM | POA: Diagnosis present

## 2022-07-03 DIAGNOSIS — I110 Hypertensive heart disease with heart failure: Secondary | ICD-10-CM | POA: Diagnosis present

## 2022-07-03 DIAGNOSIS — Z87891 Personal history of nicotine dependence: Secondary | ICD-10-CM | POA: Diagnosis not present

## 2022-07-03 DIAGNOSIS — E1142 Type 2 diabetes mellitus with diabetic polyneuropathy: Secondary | ICD-10-CM | POA: Diagnosis present

## 2022-07-03 DIAGNOSIS — E039 Hypothyroidism, unspecified: Secondary | ICD-10-CM | POA: Diagnosis present

## 2022-07-03 DIAGNOSIS — R55 Syncope and collapse: Secondary | ICD-10-CM

## 2022-07-03 DIAGNOSIS — E662 Morbid (severe) obesity with alveolar hypoventilation: Secondary | ICD-10-CM | POA: Diagnosis present

## 2022-07-03 DIAGNOSIS — I482 Chronic atrial fibrillation, unspecified: Secondary | ICD-10-CM | POA: Diagnosis present

## 2022-07-03 DIAGNOSIS — Z6833 Body mass index (BMI) 33.0-33.9, adult: Secondary | ICD-10-CM | POA: Diagnosis not present

## 2022-07-03 DIAGNOSIS — I495 Sick sinus syndrome: Secondary | ICD-10-CM | POA: Diagnosis present

## 2022-07-03 DIAGNOSIS — J9622 Acute and chronic respiratory failure with hypercapnia: Secondary | ICD-10-CM | POA: Diagnosis not present

## 2022-07-03 DIAGNOSIS — I480 Paroxysmal atrial fibrillation: Secondary | ICD-10-CM | POA: Diagnosis present

## 2022-07-03 DIAGNOSIS — J690 Pneumonitis due to inhalation of food and vomit: Secondary | ICD-10-CM | POA: Diagnosis present

## 2022-07-03 DIAGNOSIS — Z515 Encounter for palliative care: Secondary | ICD-10-CM | POA: Diagnosis not present

## 2022-07-03 DIAGNOSIS — W208XXA Other cause of strike by thrown, projected or falling object, initial encounter: Secondary | ICD-10-CM | POA: Diagnosis present

## 2022-07-03 DIAGNOSIS — G4733 Obstructive sleep apnea (adult) (pediatric): Secondary | ICD-10-CM | POA: Diagnosis not present

## 2022-07-03 DIAGNOSIS — I472 Ventricular tachycardia, unspecified: Secondary | ICD-10-CM | POA: Diagnosis present

## 2022-07-03 DIAGNOSIS — I4892 Unspecified atrial flutter: Secondary | ICD-10-CM | POA: Diagnosis present

## 2022-07-03 DIAGNOSIS — G8929 Other chronic pain: Secondary | ICD-10-CM | POA: Diagnosis present

## 2022-07-03 DIAGNOSIS — I5033 Acute on chronic diastolic (congestive) heart failure: Secondary | ICD-10-CM | POA: Diagnosis present

## 2022-07-03 DIAGNOSIS — Z950 Presence of cardiac pacemaker: Secondary | ICD-10-CM | POA: Diagnosis not present

## 2022-07-03 DIAGNOSIS — Z66 Do not resuscitate: Secondary | ICD-10-CM | POA: Diagnosis present

## 2022-07-03 DIAGNOSIS — E785 Hyperlipidemia, unspecified: Secondary | ICD-10-CM | POA: Diagnosis present

## 2022-07-03 DIAGNOSIS — I2510 Atherosclerotic heart disease of native coronary artery without angina pectoris: Secondary | ICD-10-CM | POA: Diagnosis present

## 2022-07-03 DIAGNOSIS — G2581 Restless legs syndrome: Secondary | ICD-10-CM | POA: Diagnosis present

## 2022-07-03 DIAGNOSIS — J9621 Acute and chronic respiratory failure with hypoxia: Secondary | ICD-10-CM | POA: Diagnosis not present

## 2022-07-03 LAB — BLOOD GAS, ARTERIAL
Acid-Base Excess: 4 mmol/L — ABNORMAL HIGH (ref 0.0–2.0)
Bicarbonate: 32.5 mmol/L — ABNORMAL HIGH (ref 20.0–28.0)
O2 Saturation: 98.5 %
Patient temperature: 36.6
pCO2 arterial: 65 mmHg — ABNORMAL HIGH (ref 32–48)
pH, Arterial: 7.31 — ABNORMAL LOW (ref 7.35–7.45)
pO2, Arterial: 104 mmHg (ref 83–108)

## 2022-07-03 LAB — HEPATIC FUNCTION PANEL
ALT: 16 U/L (ref 0–44)
AST: 21 U/L (ref 15–41)
Albumin: 3.9 g/dL (ref 3.5–5.0)
Alkaline Phosphatase: 53 U/L (ref 38–126)
Bilirubin, Direct: 0.2 mg/dL (ref 0.0–0.2)
Indirect Bilirubin: 1 mg/dL — ABNORMAL HIGH (ref 0.3–0.9)
Total Bilirubin: 1.2 mg/dL (ref 0.3–1.2)
Total Protein: 6.6 g/dL (ref 6.5–8.1)

## 2022-07-03 LAB — BLOOD GAS, VENOUS
Acid-Base Excess: 6.3 mmol/L — ABNORMAL HIGH (ref 0.0–2.0)
Bicarbonate: 35.6 mmol/L — ABNORMAL HIGH (ref 20.0–28.0)
Drawn by: 63183
O2 Saturation: 69.9 %
Patient temperature: 36.6
pCO2, Ven: 73 mmHg (ref 44–60)
pH, Ven: 7.3 (ref 7.25–7.43)
pO2, Ven: 43 mmHg (ref 32–45)

## 2022-07-03 LAB — AMMONIA: Ammonia: 54 umol/L — ABNORMAL HIGH (ref 9–35)

## 2022-07-03 MED ORDER — LACTULOSE 10 GM/15ML PO SOLN
30.0000 g | Freq: Three times a day (TID) | ORAL | Status: DC
  Administered 2022-07-03 (×2): 30 g via ORAL
  Filled 2022-07-03 (×2): qty 45

## 2022-07-03 NOTE — Progress Notes (Signed)
   07/03/22 2211  BiPAP/CPAP/SIPAP  $ Non-Invasive Ventilator  Non-Invasive Vent Set Up  $ Non-Invasive Home Ventilator  Initial  $ Face Mask Large  Yes  BiPAP/CPAP/SIPAP Pt Type Adult  Mask Type Full face mask  Mask Size Large  Set Rate 22 breaths/min  Respiratory Rate 20 breaths/min  Minute Ventilation 7.3  Leak 36  Peak Inspiratory Pressure (PIP) 22  Tidal Volume (Vt) 299  BiPAP/CPAP/SIPAP BiPAP  Patient Home Equipment No  Auto Titrate Yes  Press High Alarm 25 cmH2O  Press Low Alarm 5 cmH2O  BiPAP/CPAP /SiPAP Vitals  Resp (!) 22  MEWS Score/Color  MEWS Score 1  MEWS Score Color Green   Placed pt. On biapa pt. When he goes to deep sleep pt.tv goes down
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
## 2022-07-03 NOTE — Progress Notes (Signed)
  X-cover Note: Multiple calls on this patient this evening. AMS. RN concerned about his breathing. ABG shows pH 7.31, PCO2 65, PO2 104. Consistent with acute on chronic respiratory acidosis. NPO. Start Bipap. He has limited CODE status. No intubation.  Repeat ABG in 2-4 hours.   Kristopher Oppenheim, DO [facility]
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
## 2022-07-03 NOTE — Progress Notes (Signed)
Lab called with a critical PCO2 of 73. Dr. Bonner Puna notified  via secure chat and acknowledged notification. Pt's pH is normal at 7.3

## 2022-07-03 NOTE — Progress Notes (Signed)
Called to pt. Room for pt. Mental status abg collected sent to lab. Pt. Is very lethargic and unable to follow commands or answer correctly pt. Is not ready for bipap at this time

## 2022-07-03 NOTE — Patient Instructions (Signed)
Visit Information  Thank you for taking time to visit with me today. Please don't hesitate to contact me if I can be of assistance to you.   Following are the goals we discussed today:   Goals Addressed             This Visit's Progress    Managment of Stress and Obtain Supportive Resources       Care Coordination Interventions: Solution-Focused Strategies employed: LCSW collaborated with BSW regarding pt's request for home modifications to reduce fall risk. BSW will schedule call to patient to complete referral             Please call the care guide team at [phone] if you need to cancel or reschedule your appointment.   If you are experiencing a Mental Health or [place] or need someone to talk to, please call the Suicide and Crisis Lifeline: 988 call 911   Patient verbalizes understanding of instructions and care plan provided today and agrees to view in [place]. Active MyChart status and patient understanding of how to access instructions and care plan via MyChart confirmed with patient.     [name], MSW, [name] Phone [phone] 5:15 PM
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
## 2022-07-03 NOTE — Progress Notes (Addendum)
Pt was able to tolerate Bi-Pap for approximately 30 minutes, pt is now alert & oriented x4, refusing to wear Bi-pap. Pt educated on treatment regimen, pt still refuses to wear, pt placed on O2NC 2L, call bell within reach. Pt was given nighttime medications with a lot of coughing; Bridgett Larsson, MD placed NPO diet order & CXR, speech evaluation order placed by this RN.    Elaina Hoops, RN

## 2022-07-03 NOTE — Progress Notes (Signed)
Pt has been very lethargic, unable to answer orientation questions, arousable- yet falls right back to sleep. Bridgett Larsson, MD paged & RR RN called to make aware of pt's condition. New orders have been placed, will continue to monitor.  Elaina Hoops, RN

## 2022-07-03 NOTE — Patient Outreach (Signed)
  Care Coordination Late Entry  Collaborative  Visit Note   Encounter occurred 07/02/22 Name: VED MARTOS MRN: [id number] DOB: 02-Nov-1940  CONNY MOENING is a 81 y.o. year old male who sees [name], MD for primary care.   What matters to the patients health and wellness today?  Patient was not engaged during this encounter    Goals Addressed             This Visit's Progress    Managment of Stress and Obtain Supportive Resources       Care Coordination Interventions: Solution-Focused Strategies employed: LCSW collaborated with BSW regarding pt's request for home modifications to reduce fall risk. BSW will schedule call to patient to complete referral             SDOH assessments and interventions completed:  No     Care Coordination Interventions:  Yes, provided   Follow up plan: Follow up call scheduled for 1-3 weeks    Encounter Outcome:  Pt. Visit Completed   [name], MSW, [email or web address] Phone [phone] 5:14 PM

## 2022-07-04 DIAGNOSIS — G9341 Metabolic encephalopathy: Secondary | ICD-10-CM | POA: Diagnosis not present

## 2022-07-04 DIAGNOSIS — R55 Syncope and collapse: Secondary | ICD-10-CM | POA: Diagnosis not present

## 2022-07-04 DIAGNOSIS — J9621 Acute and chronic respiratory failure with hypoxia: Secondary | ICD-10-CM | POA: Diagnosis not present

## 2022-07-04 DIAGNOSIS — J9622 Acute and chronic respiratory failure with hypercapnia: Secondary | ICD-10-CM | POA: Diagnosis not present

## 2022-07-04 LAB — PROCALCITONIN: Procalcitonin: <0.1 ng/mL

## 2022-07-04 LAB — BRAIN NATRIURETIC PEPTIDE: B Natriuretic Peptide: 71.9 pg/mL (ref 0.0–100.0)

## 2022-07-04 MED ORDER — ORAL CARE MOUTH RINSE: 15.0000 mL | OROMUCOSAL | Status: DC | PRN

## 2022-07-04 MED ORDER — LACTULOSE 10 GM/15ML PO SOLN
30.0000 g | Freq: Three times a day (TID) | ORAL | Status: DC
  Administered 2022-07-04 – 2022-07-05 (×3): 30 g via ORAL
  Filled 2022-07-04 (×3): qty 45

## 2022-07-04 MED ORDER — BUMETANIDE 0.25 MG/ML IJ SOLN
0.5000 mg | Freq: Once | INTRAMUSCULAR | Status: AC
  Administered 2022-07-04: 0.5 mg via INTRAVENOUS
  Filled 2022-07-04: qty 2

## 2022-07-04 MED ORDER — FUROSEMIDE 10 MG/ML IJ SOLN
40.0000 mg | Freq: Once | INTRAMUSCULAR | Status: AC
  Administered 2022-07-04: 40 mg via INTRAVENOUS
  Filled 2022-07-04: qty 4

## 2022-07-04 MED ORDER — ORAL CARE MOUTH RINSE
15.0000 mL | OROMUCOSAL | Status: DC
  Administered 2022-07-04 – 2022-07-05 (×5): 15 mL via OROMUCOSAL

## 2022-07-04 MED ORDER — PIPERACILLIN-TAZOBACTAM 3.375 G IVPB
3.3750 g | Freq: Three times a day (TID) | INTRAVENOUS | Status: DC
  Administered 2022-07-04 – 2022-07-05 (×3): 3.375 g via INTRAVENOUS
  Filled 2022-07-04 (×5): qty 50

## 2022-07-04 NOTE — Progress Notes (Signed)
Patient incontinent of urine and confused to place and time. Noted to be lethargic with Oxygen sats decreasing to the 70s on 2L [place]. Placed on BiPAP by RT with sats increasing to the 90s.  Dr. Bonner Puna notified. Will continue to monitor.

## 2022-07-04 NOTE — Progress Notes (Signed)
Pt has became more lethargic, still refusing Bipap; BP 115/68 MAP 82, O2 96% on 3LNC, RR 18, HR 65. Bridgett Larsson, MD made aware: see new orders.   Elaina Hoops, RN

## 2022-07-04 NOTE — Progress Notes (Signed)
Patient consents for information to be given to sister Jesusita Oka and girlfriend [name].

## 2022-07-04 NOTE — Progress Notes (Signed)
PT Cancellation Note  Patient Details Name: [name] MRN: [id number] DOB: [birth year]   Cancelled Treatment:    Reason Eval/Treat Not Completed: (P) Fatigue/lethargy limiting ability to participate Pt with increase lethargy, refusing BiPAP. Pt will follow back this afternoon as able.   Laressa Bolinger B. Migdalia Dk PT, DPT Acute Rehabilitation Services Please use secure chat or  Call Office [phone]    [place] 07/04/2022, 11:35 AM
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
## 2022-07-04 NOTE — Progress Notes (Signed)
Patient continued to refuse to wear BiPAP, states it is not comfortable. Family at bedside and tried to encourage patient to use BiPAP. Patient refused.  More alert and responsive at this time, but confused to time and situation..  Asking for water and food.  Mouth care given and patient given sips of water.

## 2022-07-04 NOTE — Progress Notes (Signed)
Pt tolerated Bipap for approximately an hour, removed pt from [employer], attempted to administer nighttime medications, placed on 5L [place]; pt then vomited, O2 sats 94%, appears to be in A-fib rhythm rate-controlled paced, HR 74, BP 118/72 MAP 86, A&O x4. [place], MD made aware.   Elaina Hoops, RN

## 2022-07-04 NOTE — Progress Notes (Signed)
PT Cancellation Note  Patient Details Name: [name] MRN: [id number] DOB: Dec 07, 1940   Cancelled Treatment:    Reason Eval/Treat Not Completed: (P) Fatigue/lethargy limiting ability to participate Pt continues to be lethargic, refusing BiPAP. PT will follow back tomorrow for treatment.  Ambrielle Kington B. Migdalia Dk PT, DPT Acute Rehabilitation Services Please use secure chat or  Call Office [phone]    [place] 07/04/2022, 2:58 PM

## 2022-07-04 NOTE — Progress Notes (Signed)
Pharmacy Antibiotic Note  [name] is a 81 y.o. male admitted on 07/19/2022 with  aspiration PNA .  Pharmacy has been consulted for zosyn dosing.  PCT<0.1, WBC 11.3 on last check. Afebrile. CXR yesterday showing bibasilar airspace opacities.   Plan: Zosyn 3.375g IV q8h (4 hour infusion). Monitor renal fx, cx results, clinical pic Will monitor peripherally for change in renal function   Height: '5\' 9"'$  (175.3 cm) Weight: 99.9 kg (220 lb 3.8 oz) IBW/kg (Calculated) : 70.7  Temp (24hrs), Avg:98.2 F (36.8 C), Min:97.8 F (36.6 C), Max:99.3 F (37.4 C)  Recent Labs  Lab 06/22/2022 1256 07/02/22 0435  WBC 10.5 11.3*  CREATININE 0.88 0.88    Estimated Creatinine Clearance: 78 mL/min (by C-G formula based on SCr of 0.88 mg/dL).    Allergies  Allergen Reactions   Brilinta [Ticagrelor] Shortness Of Breath   Colchicine     Syncope-causes patient to pass out   Metoprolol Tartrate Other (See Comments)    Severe chest pains " flat lined patient", chest pain, Tachyarrhythmia   Mirapex [Pramipexole Dihydrochloride]     Severe leg pain   Shellfish Allergy Anaphylaxis and Hives   Statins Other (See Comments)    All statins cause myalgias  Pt claim Lipitor cause unk reaction   Zolpidem Other (See Comments)    Chest pain   Coconut (Cocos Nucifera) Hives   Lasix [Furosemide] Hives and Itching   Levaquin [Levofloxacin] Hives   Trazodone And Nefazodone Other (See Comments)    Unsteady on feet    Antimicrobials this admission: Zosyn 12/13 >>   Dose adjustments this admission: N/A  Microbiology results: None  Thank you for allowing pharmacy to be a part of this patient's care.  Antonietta Jewel, PharmD, [place] Clinical Pharmacist  Phone: [phone] 07/04/2022 12:36 PM  Please check AMION for all [place] phone numbers After 10:00 PM, call [place] [phone]

## 2022-07-04 NOTE — Consult Note (Signed)
NAME:  [name], MRN:  [id number], DOB:  1941/06/08, LOS: 1 ADMISSION DATE:  07/19/2022, CONSULTATION DATE:  07/04/2022 REFERRING MD:  Bonner Puna, CHIEF COMPLAINT:  AMS   History of Present Illness:  81 yo male former smoker with altered mental status.  Has chronic pain.  ABG showed hypercapnia.  He has been refusing Bipap.  He is limited resuscitation - no intubation.    Hx from chart.  Pertinent  Medical History  A flutter, AVM, CAD, HFpEF, Depression, GERD, Hiatal hernia, Diverticulosis, HLD, HTN, Hypothyroidism, OSA, DM  Significant Hospital Events: Including procedures, antibiotic start and stop dates in addition to other pertinent events   12/13 PCCM consulted  Interim History / Subjective:  Unable to obtain information  Objective   Blood pressure 131/72, pulse 85, temperature 97.8 F (36.6 C), resp. rate (!) 22, height '5\' 9"'$  (1.753 m), weight 99.9 kg, SpO2 93 %.    FiO2 (%):  [50 %] 50 %  No intake or output data in the 24 hours ending 07/04/22 1200 Filed Weights   07/02/22 1300 07/04/22 0500  Weight: 101.7 kg 99.9 kg    Examination:  General - obtunded Eyes - pupils reactive ENT - snoring, dry mucosa  Cardiac - regular rate/rhythm, no murmur Chest - diminished breath sounds b/l Abdomen - soft, non tender, + bowel sounds Extremities - no cyanosis, clubbing, or edema Skin - no rashes Neuro - mumbles incoherent sounds with brisk stimulation then quickly falls asleep  Resolved Hospital Problem list     Assessment & Plan:   Acute on chronic hypoxic/hypercapnic respiratory failure. - from aspiration pneumonitis, CHF, and sleep disordered breathing with opiate use - no intubation - refused Bipap - add ABx - avoid sedating medications - diurese as tolerated - supplemental oxygen to keep SpO2 > 90% - further d/w family about goals of care if mental and respiratory status don't improve  Acute metabolic encephalopathy from hypercapnia. Chronic pain. -  avoid sedating medications  Acute on chronic HFpEF. PAF. Sinus node dysfunction s/p PPM. CAD. - cardiology consulted  DM type 2 with peripheral neuropathy. HLD. HTN. Hypothyroidism. - per primary team  Best Practice (right click and "Reselect all SmartList Selections" daily)   Diet/type: NPO DVT prophylaxis: DOAC GI prophylaxis: PPI Lines: N/A Foley:  N/A Code Status:  limited Last date of multidisciplinary goals of care discussion '[x]'$   Labs   CBC: Recent Labs  Lab 06/24/2022 1256 07/02/22 0435  WBC 10.5 11.3*  NEUTROABS 6.2  --   HGB 11.7* 11.9*  HCT 39.6 40.1  MCV 83.7 83.7  PLT 289 767    Basic Metabolic Panel: Recent Labs  Lab 06/30/2022 1256 07/19/2022 2016 07/02/22 0435  NA 142  --  138  K 4.1  --  4.1  CL 104  --  102  CO2 27  --  29  GLUCOSE 103*  --  112*  BUN 17  --  15  CREATININE 0.88  --  0.88  CALCIUM 9.3  --  8.9  MG  --  1.9  --    GFR: Estimated Creatinine Clearance: 78 mL/min (by C-G formula based on SCr of 0.88 mg/dL). Recent Labs  Lab 06/30/2022 1256 07/02/22 0435  WBC 10.5 11.3*    Liver Function Tests: Recent Labs  Lab 07/03/22 1422  AST 21  ALT 16  ALKPHOS 53  BILITOT 1.2  PROT 6.6  ALBUMIN 3.9   No results for input(s): "LIPASE", "AMYLASE" in the last 168 hours. Recent  Labs  Lab 07/03/22 1422  AMMONIA 54*    ABG    Component Value Date/Time   PHART 7.31 (L) 07/03/2022 2141   PCO2ART 65 (H) 07/03/2022 2141   PO2ART 104 07/03/2022 2141   HCO3 32.5 (H) 07/03/2022 2141   TCO2 32 01/25/2022 0800   ACIDBASEDEF 3.0 (H) 09/03/2007 0600   O2SAT 98.5 07/03/2022 2141     Coagulation Profile: Recent Labs  Lab 07/12/2022 1256  INR 1.1    Cardiac Enzymes: No results for input(s): "CKTOTAL", "CKMB", "CKMBINDEX", "TROPONINI" in the last 168 hours.  HbA1C: Hgb A1c MFr Bld  Date/Time Value Ref Range Status  11/21/2021 09:45 AM 6.3 4.6 - 6.5 % Final    Comment:    Glycemic Control Guidelines for People with  Diabetes:Non Diabetic:  <6%Goal of Therapy: <7%Additional Action Suggested:  >8%   06/26/2021 11:25 AM 6.7 (H) 4.8 - 5.6 % Final    Comment:    (NOTE) Pre diabetes:          5.7%-6.4%  Diabetes:              >6.4%  Glycemic control for   <7.0% adults with diabetes     CBG: No results for input(s): "GLUCAP" in the last 168 hours.  Review of Systems:   Unable to obtain.  Past Medical History:  He,  has a past medical history of Adenomatous polyp of colon (2007), Arthritis, Atrial flutter ([place]), AVM (arteriovenous malformation) (2011), Bradycardia, CAD (coronary artery disease), Chronic diastolic CHF (congestive heart failure) ([place]), Depression, Diverticulosis, Gastritis (2011), GERD (gastroesophageal reflux disease), History of hiatal hernia, Hyperlipidemia, Hypertension, Hypothyroidism, Myocardial infarction [facility]), Obstructive sleep apnea, Pre-diabetes, Premature atrial contractions ([place] 2016), Presence of permanent cardiac pacemaker, PVC's (premature ventricular contractions) ([place] 2016), Statin intolerance, and Transfusion history.   Surgical History:   Past Surgical History:  Procedure Laterality Date   ANKLE SURGERY Left    APPENDECTOMY     CARDIAC CATHETERIZATION N/A 11/26/2014   Procedure: Left Heart Cath and Coronary Angiography;  Surgeon: Burnell Blanks, MD;  Location: [place] [facility];  Service: Cardiovascular;  Laterality: N/A;   CHOLECYSTECTOMY N/A 08/23/2016   Procedure: LAPAROSCOPIC CHOLECYSTECTOMY;  Surgeon: Coralie Keens, MD;  Location: [place];  Service: General;  Laterality: N/A;   COLONOSCOPY  08-23-05   per Dr. Deatra Ina, adenomatous polyps, repeat in 5 yrs    COLONOSCOPY WITH PROPOFOL N/A 12/06/2015   Procedure: COLONOSCOPY WITH PROPOFOL;  Surgeon: Doran Stabler, MD;  Location: [facility];  Service: Gastroenterology;  Laterality: N/A;   CORONARY ANGIOGRAPHY N/A 02/28/2022   Procedure: CORONARY ANGIOGRAPHY;  Surgeon: Martinique, Peter M, MD;  Location: [place] [facility];  Service: Cardiovascular;  Laterality: N/A;   CORONARY CTO INTERVENTION N/A 02/28/2022   Procedure: CORONARY CTO INTERVENTION;  Surgeon: Martinique, Peter M, MD;  Location: [place] [facility];  Service: Cardiovascular;  Laterality: N/A;   CORONARY STENT INTERVENTION N/A 01/25/2022   Procedure: CORONARY STENT INTERVENTION;  Surgeon: Burnell Blanks, MD;  Location: [place] [facility];  Service: Cardiovascular;  Laterality: N/A;   ENTEROSCOPY N/A 12/06/2015   Procedure: ENTEROSCOPY;  Surgeon: Doran Stabler, MD;  Location: [facility];  Service: Gastroenterology;  Laterality: N/A;   ESOPHAGOGASTRODUODENOSCOPY  08-23-05   per Dr. Deatra Ina, cauterized jejunal AVMs    [place] N/A 12/06/2015   Procedure: HOT HEMOSTASIS (ARGON PLASMA COAGULATION/BICAP);  Surgeon: Doran Stabler, MD;  Location: [facility];  Service: Gastroenterology;  Laterality: N/A;   INGUINAL HERNIA REPAIR Left 06/29/2021   Procedure: LAPAROSCOPIC LEFT INGUINAL HERNIA REPAIR WITH MESH;  Surgeon: Ralene Ok, MD;  Location: [place];  Service: General;  Laterality: Left;   INTRAVASCULAR ULTRASOUND/IVUS N/A 02/28/2022   Procedure: Intravascular Ultrasound/IVUS;  Surgeon: Martinique, Peter M, MD;  Location: [place] [facility];  Service: Cardiovascular;  Laterality: N/A;   LEFT HEART CATHETERIZATION WITH CORONARY ANGIOGRAM N/A 09/08/2012   Procedure: LEFT HEART CATHETERIZATION WITH CORONARY ANGIOGRAM;  Surgeon: Burnell Blanks, MD;  Location: [facility];  Service: Cardiovascular;  Laterality: N/A;   LEFT HEART CATHETERIZATION WITH CORONARY ANGIOGRAM N/A 11/16/2014   Procedure: LEFT HEART CATHETERIZATION WITH CORONARY ANGIOGRAM;  Surgeon: Leonie Man, MD;  Location: [facility];  Service: Cardiovascular;  Laterality: N/A;   PACEMAKER IMPLANT N/A 12/24/2016   Procedure: Pacemaker Implant;  Surgeon: Deboraha Sprang, MD;  Location: [place] [facility];  Service: Cardiovascular;  Laterality: N/A;    RIGHT/LEFT HEART CATH AND CORONARY ANGIOGRAPHY N/A 01/25/2022   Procedure: RIGHT/LEFT HEART CATH AND CORONARY ANGIOGRAPHY;  Surgeon: Burnell Blanks, MD;  Location: [place] [facility];  Service: Cardiovascular;  Laterality: N/A;   SKIN GRAFT Right    leg   TONSILLECTOMY     UMBILICAL HERNIA REPAIR N/A 06/29/2021   Procedure: OPEN UMBILICAL HERNIA REPAIR WITH MESH;  Surgeon: Ralene Ok, MD;  Location: [place];  Service: General;  Laterality: N/A;     Social History:   reports that he quit smoking about 19 years ago. His smoking use included cigarettes. He has a 100.00 pack-year smoking history. He has been exposed to tobacco smoke. He has never used smokeless tobacco. He reports that he does not drink alcohol and does not use drugs.   Family History:  His family history includes Alcoholism in his maternal grandfather and paternal uncle; Heart attack in his father; Heart disease in his father; Leukemia in his mother; Stroke in his father. There is no history of Colon cancer or Esophageal cancer.   Allergies Allergies  Allergen Reactions   Brilinta [Ticagrelor] Shortness Of Breath   Colchicine     Syncope-causes patient to pass out   Metoprolol Tartrate Other (See Comments)    Severe chest pains " flat lined patient", chest pain, Tachyarrhythmia   Mirapex [Pramipexole Dihydrochloride]     Severe leg pain   Shellfish Allergy Anaphylaxis and Hives   Statins Other (See Comments)    All statins cause myalgias  Pt claim Lipitor cause unk reaction   Zolpidem Other (See Comments)    Chest pain   Coconut (Cocos Nucifera) Hives   Lasix [Furosemide] Hives and Itching   Levaquin [Levofloxacin] Hives   Trazodone And Nefazodone Other (See Comments)    Unsteady on feet     Home Medications  Prior to Admission medications   Medication Sig Start Date End Date Taking? Authorizing Provider  acetaminophen (TYLENOL) 500 MG tablet Take 1,000 mg by mouth every 6 (six) hours as needed for  moderate pain.   Yes [provider]  albuterol (VENTOLIN HFA) 108 (90 Base) MCG/ACT inhaler Inhale 2 puffs into the lungs every 6 (six) hours as needed for wheezing or shortness of breath. 03/16/22  Yes Martinique, Peter M, MD  apixaban (ELIQUIS) 5 MG TABS tablet Take 1 tablet (5 mg total) by mouth 2 (two) times daily. 03/19/22  Yes Martinique, Peter M, MD  Ascorbic Acid (VITAMIN C PO) Take 1-4 tablets by mouth See admin  instructions. Take 1 tablet daily, may increase to 3-4 tabs daily as needed for immune support   Yes [provider]  Carboxymethylcellulose Sod PF (LUBRICANT EYE DROPS PF) 0.5 % SOLN Place 1 drop into both eyes 4 (four) times daily as needed (dry/irritated eyes.).   Yes [provider]  clotrimazole-betamethasone (LOTRISONE) cream Apply 1 Application topically 2 (two) times daily. 04/18/22  Yes [name], MD  diclofenac sodium (VOLTAREN) 1 % GEL Apply 1 application topically daily as needed (ankle pain). 11/27/14  Yes Barrett, Evelene Croon, PA-C  diltiazem (CARDIZEM CD) 120 MG 24 hr capsule TAKE 1 CAPSULE BY MOUTH EVERY DAY 03/01/22  Yes Reino Bellis B, NP  fluconazole (DIFLUCAN) 150 MG tablet Take 1 tablet (150 mg total) by mouth daily. 04/05/22  Yes [name], MD  fluticasone (FLONASE) 50 MCG/ACT nasal spray PLACE 1 SPRAY INTO BOTH NOSTRILS DAILY AS NEEDED FOR ALLERGIES OR RHINITIS. 02/22/22  Yes [name], MD  ketoconazole (NIZORAL) 2 % cream Apply 1 application topically 2 (two) times daily as needed for irritation. 08/07/21  Yes [name], MD  levothyroxine (SYNTHROID) 175 MCG tablet Take 1 tablet (175 mcg total) by mouth daily before breakfast. 02/12/22  Yes Weaver, Scott T, PA-C  magnesium oxide (MAG-OX) 400 MG tablet Take 400 mg by mouth daily as needed (restless leg).   Yes [provider]  moxifloxacin (VIGAMOX) 0.5 % ophthalmic solution Place 1 drop into the right eye in the morning and at bedtime.   Yes [provider]   nitroGLYCERIN (NITROSTAT) 0.4 MG SL tablet Place 1 tablet (0.4 mg total) under the tongue every 5 (five) minutes as needed for chest pain. 03/01/22  Yes Cheryln Manly, NP  oxycodone (ROXICODONE) 30 MG immediate release tablet Take 1 tablet (30 mg total) by mouth every 6 (six) hours as needed for pain. 07/05/22 08/04/22 Yes [name], MD  ranolazine (RANEXA) 500 MG 12 hr tablet Take 500 mg by mouth 2 (two) times daily. 03/08/22  Yes [provider]  ropinirole (REQUIP) 5 MG tablet TAKE 1 TABLET (5 MG TOTAL) BY MOUTH IN THE MORNING, AT NOON, AND AT BEDTIME. Patient taking differently: Take 5 mg by mouth in the morning, at noon, and at bedtime. 05/23/22  Yes [name], MD  torsemide (DEMADEX) 20 MG tablet Take 0.5 tablets (10 mg total) by mouth See admin instructions. Take 10 mg daily, may take a second 10 mg dose an hour later if he doesn't use the bathroom Patient taking differently: Take 20 mg by mouth 2 (two) times a week. 01/26/22  Yes Sande Rives E, PA-C  valACYclovir (VALTREX) 500 MG tablet Take 500 mg by mouth in the morning. 02/25/22  Yes [provider]  vitamin B-12 (CYANOCOBALAMIN) 500 MCG tablet Take 500 mcg by mouth daily.   Yes [provider]  clopidogrel (PLAVIX) 75 MG tablet Take 1 tablet (75 mg total) by mouth daily. Patient not taking: Reported on 07/02/2022 06/27/22   Burnell Blanks, MD  glucose blood test strip Use as instructed 06/28/22   [name], MD  isosorbide mononitrate (IMDUR) 60 MG 24 hr tablet Take 1.5 tablets (90 mg total) by mouth daily. 03/01/22   Cheryln Manly, NP  pantoprazole (PROTONIX) 40 MG tablet TAKE 1 TABLET BY MOUTH EVERY DAY 05/28/22   Burnell Blanks, MD     Critical care time: 11 minutes  Chesley Mires, MD [place] Pager - [phone])  [phone] 07/04/2022, 12:12 PM

## 2022-07-04 NOTE — Progress Notes (Addendum)
Rounding Note    Patient Name: [name] Date of Encounter: 07/04/2022  [place] Cardiologist: [name], MD   Subjective   Lethargic, but awakens to verbal stimuli. Quickly falls back to sleep.    Inpatient Medications    Scheduled Meds:  apixaban  5 mg Oral BID   clopidogrel  75 mg Oral Daily   diltiazem  120 mg Oral Daily   isosorbide mononitrate  90 mg Oral Daily   lactulose  30 g Oral TID   levothyroxine  175 mcg Oral Q0600   magnesium oxide  400 mg Oral Daily   pantoprazole  40 mg Oral Daily   ranolazine  500 mg Oral BID   ropinirole  10 mg Oral BID   sodium chloride flush  3 mL Intravenous Q12H   valACYclovir  500 mg Oral q AM   Continuous Infusions:  PRN Meds: acetaminophen **OR** acetaminophen, albuterol, ondansetron **OR** ondansetron (ZOFRAN) IV, mouth rinse, polyvinyl alcohol   Vital Signs    Vitals:   07/04/22 0029 07/04/22 0430 07/04/22 0500 07/04/22 0744  BP: (!) 130/57 115/68  127/67  Pulse: 68 64  62  Resp: 19 18  (!) 21  Temp: 99.3 F (37.4 C) 97.8 F (36.6 C)    TempSrc: Oral Oral    SpO2: 98% 97%  98%  Weight:   99.9 kg   Height:       No intake or output data in the 24 hours ending 07/04/22 0947    07/04/2022    5:00 AM 07/02/2022    1:00 PM 06/18/2022   10:27 AM  Last 3 Weights  Weight (lbs) 220 lb 3.8 oz 224 lb 4.8 oz 217 lb  Weight (kg) 99.9 kg 101.742 kg 98.431 kg      Telemetry    Sinus, v-pacing with intermittent AV pacing - Personally Reviewed  ECG    No new tracing  Physical Exam   GEN: Sitting up in bed, lethargic but awakens to verbal stimuli. On [place] Neck: No JVD Cardiac: RRR, no murmurs, rubs, or gallops.  Respiratory: crackles in bases GI: Soft, nontender, non-distended  MS: No edema; No deformity. Neuro:  Nonfocal  Psych: Normal affect   Labs    High Sensitivity Troponin:   Recent Labs  Lab 07/05/2022 1256 07/16/2022 1458  TROPONINIHS 6 6     Chemistry Recent Labs   Lab 06/23/2022 1256 06/27/2022 2016 07/02/22 0435 07/03/22 1422  NA 142  --  138  --   K 4.1  --  4.1  --   CL 104  --  102  --   CO2 27  --  29  --   GLUCOSE 103*  --  112*  --   BUN 17  --  15  --   CREATININE 0.88  --  0.88  --   CALCIUM 9.3  --  8.9  --   MG  --  1.9  --   --   PROT  --   --   --  6.6  ALBUMIN  --   --   --  3.9  AST  --   --   --  21  ALT  --   --   --  16  ALKPHOS  --   --   --  53  BILITOT  --   --   --  1.2  GFRNONAA >60  --  >60  --   ANIONGAP 11  --  7  --     Lipids No results for input(s): "CHOL", "TRIG", "HDL", "LABVLDL", "LDLCALC", "CHOLHDL" in the last 168 hours.  Hematology Recent Labs  Lab 06/23/2022 1256 07/02/22 0435  WBC 10.5 11.3*  RBC 4.73 4.79  HGB 11.7* 11.9*  HCT 39.6 40.1  MCV 83.7 83.7  MCH 24.7* 24.8*  MCHC 29.5* 29.7*  RDW 16.4* 16.4*  PLT 289 311   Thyroid  Recent Labs  Lab 07/02/22 1547  TSH 2.218    BNP Recent Labs  Lab 07/19/2022 1326  BNP 42.4    DDimer No results for input(s): "DDIMER" in the last 168 hours.   Radiology    DG CHEST PORT 1 VIEW  Result Date: 07/03/2022 CLINICAL DATA:  [id number] [place] [id number] EXAM: PORTABLE CHEST 1 VIEW COMPARISON:  Chest x-ray 07/20/2022, chest x-ray 06/29/2022, CT chest 01/29/2022 FINDINGS: Left chest wall 2 lead pacemaker in grossly appropriate position. The heart and mediastinal contours are unchanged. Low lung volumes. Bibasilar airspace opacities. No pulmonary edema. Least trace right pleural effusion. Question trace left pleural effusion. No pneumothorax. No acute osseous abnormality. IMPRESSION: Bibasilar airspace opacities that may represent a combination of atelectasis versus infection. At least trace right pleural effusion. Question trace left pleural effusion Electronically Signed   By: Iven Finn M.D.   On: 07/03/2022 22:01    Cardiac Studies   N/a   Patient Profile     81 y.o. male with a hx of CAD, HTN, HLD, OSA not on CPAP, sinus node dysfunction s/p PPM,  atrial flutter, restless leg syndrome and chronic insomnia who was seen12/05/2022 for the evaluation of possible syncope at the request of Dr. Lorin Mercy.   Assessment & Plan    LOC Possible syncope Somnolence -- He has been quite lethargic intermittently during this admission.  In review of chart he has received several doses of IV morphine, Robaxin, as well as oxycodone.  Review of home medications notes he is on oxycodone of 30 mg every 6 hours.  -- Ammonia level was noted to be elevated, he has been started on lactulose. -- All sedating medications have been DC'd for now -- Per primary  HFpEF Chronic hypercarbic respiratory failure OSA? --In the setting of seating medications? -- CXR this morning with atelectasis, trace right pleural effusion -- check BNP -- give IV lasix '40mg'$  x1, plan to resume home torsemide -- outpatient sleep study  Paroxysmal Atrial flutter --Continue diltiazem, Eliquis  Sinus node dysfunction status post PPM -- V pacing, intermittent AV pacing   For questions or updates, please contact [place] Please consult [email or web address] for contact info under        Signed, Reino Bellis, NP  07/04/2022, 9:47 AM    I have personally seen and examined this patient. I agree with the assessment and plan as outlined above. He is somnolent today. His pain meds have been stopped. Unclear what is driving his hypercarbic respiratory failure. Elevated ammonia level. He is receiving lactulose. He has crackles on exam to was given IV Lasix today. I do not think his current respiratory issues are driven by his cardiac problems. His episodes of possible syncope at home are more likely related to his chronic respiratory failure. Continue with diuretics as above. I would suggest a Pulmonary consultation.    [name], MD, [facility] 07/04/2022 10:50 AM

## 2022-07-04 NOTE — Progress Notes (Signed)
SLP Cancellation Note  Patient Details Name: [name] MRN: [id number] DOB: 10/02/1940   Cancelled treatment:       Reason Eval/Treat Not Completed: Medical issues which prohibited therapy  Unable to complete BSE at this time. RN reports pt is requiring BiPAP intermittently. Will continue efforts.   Sammie Denner B. Quentin Ore, [facility], [place] Speech Language Pathologist Office: [phone]  Shonna Chock 07/04/2022, 9:12 AM

## 2022-07-04 NOTE — Progress Notes (Signed)
Mobility Specialist - Progress Note   07/04/22 1408  Mobility  Activity Contraindicated/medical hold   Pt extremely lethargic today. Will follow up throughout stay.  Franki Monte  Mobility Specialist Please contact via Solicitor or Rehab office at [phone]

## 2022-07-05 ENCOUNTER — Inpatient Hospital Stay (HOSPITAL_COMMUNITY): Payer: Medicare HMO

## 2022-07-05 DIAGNOSIS — Z6831 Body mass index (BMI) 31.0-31.9, adult: Secondary | ICD-10-CM

## 2022-07-05 DIAGNOSIS — J9621 Acute and chronic respiratory failure with hypoxia: Secondary | ICD-10-CM | POA: Diagnosis not present

## 2022-07-05 DIAGNOSIS — R55 Syncope and collapse: Secondary | ICD-10-CM | POA: Diagnosis not present

## 2022-07-05 DIAGNOSIS — E662 Morbid (severe) obesity with alveolar hypoventilation: Secondary | ICD-10-CM | POA: Diagnosis not present

## 2022-07-05 DIAGNOSIS — J690 Pneumonitis due to inhalation of food and vomit: Secondary | ICD-10-CM

## 2022-07-05 DIAGNOSIS — Z515 Encounter for palliative care: Secondary | ICD-10-CM

## 2022-07-05 DIAGNOSIS — Z7189 Other specified counseling: Secondary | ICD-10-CM

## 2022-07-05 DIAGNOSIS — G4733 Obstructive sleep apnea (adult) (pediatric): Secondary | ICD-10-CM | POA: Diagnosis not present

## 2022-07-05 LAB — BASIC METABOLIC PANEL
Anion gap: 10 (ref 5–15)
BUN: 19 mg/dL (ref 8–23)
CO2: 32 mmol/L (ref 22–32)
Calcium: 9 mg/dL (ref 8.9–10.3)
Chloride: 98 mmol/L (ref 98–111)
Creatinine, Ser: 1.29 mg/dL — ABNORMAL HIGH (ref 0.61–1.24)
GFR, Estimated: 56 mL/min — ABNORMAL LOW (ref >60)
Glucose, Bld: 145 mg/dL — ABNORMAL HIGH (ref 70–99)
Potassium: 4.3 mmol/L (ref 3.5–5.1)
Sodium: 140 mmol/L (ref 135–145)

## 2022-07-05 LAB — PROCALCITONIN: Procalcitonin: <0.1 ng/mL

## 2022-07-05 MED ORDER — HALOPERIDOL 0.5 MG PO TABS: 0.5000 mg | ORAL_TABLET | ORAL | Status: DC | PRN

## 2022-07-05 MED ORDER — HALOPERIDOL LACTATE 5 MG/ML IJ SOLN: 0.5000 mg | INTRAMUSCULAR | Status: DC | PRN

## 2022-07-05 MED ORDER — GLYCOPYRROLATE 1 MG PO TABS: 1.0000 mg | ORAL_TABLET | ORAL | Status: DC | PRN

## 2022-07-05 MED ORDER — GLYCOPYRROLATE 0.2 MG/ML IJ SOLN
0.2000 mg | INTRAMUSCULAR | Status: DC | PRN
  Administered 2022-07-06: 0.2 mg via INTRAVENOUS
  Filled 2022-07-05: qty 1

## 2022-07-05 MED ORDER — LORAZEPAM 1 MG PO TABS: 1.0000 mg | ORAL_TABLET | ORAL | Status: DC | PRN

## 2022-07-05 MED ORDER — HALOPERIDOL LACTATE 2 MG/ML PO CONC: 0.5000 mg | ORAL | Status: DC | PRN

## 2022-07-05 MED ORDER — HYDROMORPHONE HCL 1 MG/ML IJ SOLN
2.0000 mg | INTRAMUSCULAR | Status: DC | PRN
  Administered 2022-07-05 – 2022-07-06 (×2): 2 mg via INTRAVENOUS
  Filled 2022-07-05 (×2): qty 2

## 2022-07-05 MED ORDER — LORAZEPAM 2 MG/ML IJ SOLN: 1.0000 mg | INTRAMUSCULAR | Status: DC | PRN

## 2022-07-05 MED ORDER — MORPHINE SULFATE (PF) 2 MG/ML IV SOLN
2.0000 mg | INTRAVENOUS | Status: DC | PRN
  Filled 2022-07-05: qty 1

## 2022-07-05 MED ORDER — OXYCODONE HCL 5 MG PO TABS
30.0000 mg | ORAL_TABLET | ORAL | Status: DC | PRN
  Administered 2022-07-05: 30 mg via ORAL
  Filled 2022-07-05: qty 6

## 2022-07-05 MED ORDER — LORAZEPAM 2 MG/ML PO CONC: 1.0000 mg | ORAL | Status: DC | PRN

## 2022-07-05 MED ORDER — GLYCOPYRROLATE 0.2 MG/ML IJ SOLN: 0.2000 mg | INTRAMUSCULAR | Status: DC | PRN
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
## 2022-07-05 NOTE — Progress Notes (Signed)
Rounding Note    Patient Name: [name] Date of Encounter: 07/05/2022  [facility] Cardiologist: [name], MD   Subjective   Pt somnolent, wearing bipap.   Inpatient Medications    Scheduled Meds:  apixaban  5 mg Oral BID   clopidogrel  75 mg Oral Daily   diltiazem  120 mg Oral Daily   isosorbide mononitrate  90 mg Oral Daily   lactulose  30 g Oral TID   levothyroxine  175 mcg Oral Q0600   magnesium oxide  400 mg Oral Daily   mouth rinse  15 mL Mouth Rinse 4 times per day   pantoprazole  40 mg Oral Daily   ranolazine  500 mg Oral BID   ropinirole  10 mg Oral BID   sodium chloride flush  3 mL Intravenous Q12H   valACYclovir  500 mg Oral q AM   Continuous Infusions:  piperacillin-tazobactam (ZOSYN)  IV 3.375 g (07/05/22 0605)   PRN Meds: acetaminophen **OR** acetaminophen, albuterol, ondansetron **OR** ondansetron (ZOFRAN) IV, mouth rinse, mouth rinse, polyvinyl alcohol   Vital Signs    Vitals:   07/04/22 2020 07/04/22 2030 07/05/22 0024 07/05/22 0557  BP: 118/72   (!) 146/76  Pulse: 79 82 80 90  Resp: 18 20 (!) 23 20  Temp: 97.8 F (36.6 C)   98.7 F (37.1 C)  TempSrc: Oral   Oral  SpO2: 96% 96% 96% 93%  Weight:    95.3 kg  Height:        Intake/Output Summary (Last 24 hours) at 07/05/2022 0803 Last data filed at 07/05/2022 0612 Gross per 24 hour  Intake 269.95 ml  Output 1900 ml  Net -1630.05 ml      07/05/2022    5:57 AM 07/04/2022    5:00 AM 07/02/2022    1:00 PM  Last 3 Weights  Weight (lbs) 210 lb 1.6 oz 220 lb 3.8 oz 224 lb 4.8 oz  Weight (kg) 95.3 kg 99.9 kg 101.742 kg      Telemetry    Atrial fib, paced- Personally Reviewed  ECG    No new tracing  Physical Exam   General: Well developed, well nourished, somnolent but responsive SKIN: warm, dry. No rashes. Psychiatric: somnolent Neck: No JVD Lungs:Clear bilaterally, no wheezes, rhonci, crackles Cardiovascular: Irregular.  Abdomen:Soft. Bowel  sounds present. Non-tender.  Extremities: No lower extremity edema.   Labs    High Sensitivity Troponin:   Recent Labs  Lab 07/17/2022 1256 07/17/2022 1458  TROPONINIHS 6 6     Chemistry Recent Labs  Lab 07/07/2022 1256 06/24/2022 2016 07/02/22 0435 07/03/22 1422 07/05/22 0153  NA 142  --  138  --  140  K 4.1  --  4.1  --  4.3  CL 104  --  102  --  98  CO2 27  --  29  --  32  GLUCOSE 103*  --  112*  --  145*  BUN 17  --  15  --  19  CREATININE 0.88  --  0.88  --  1.29*  CALCIUM 9.3  --  8.9  --  9.0  MG  --  1.9  --   --   --   PROT  --   --   --  6.6  --   ALBUMIN  --   --   --  3.9  --   AST  --   --   --  21  --  ALT  --   --   --  16  --   ALKPHOS  --   --   --  53  --   BILITOT  --   --   --  1.2  --   GFRNONAA >60  --  >60  --  56*  ANIONGAP 11  --  7  --  10    Lipids No results for input(s): "CHOL", "TRIG", "HDL", "LABVLDL", "LDLCALC", "CHOLHDL" in the last 168 hours.  Hematology Recent Labs  Lab 07/15/2022 1256 07/02/22 0435  WBC 10.5 11.3*  RBC 4.73 4.79  HGB 11.7* 11.9*  HCT 39.6 40.1  MCV 83.7 83.7  MCH 24.7* 24.8*  MCHC 29.5* 29.7*  RDW 16.4* 16.4*  PLT 289 311   Thyroid  Recent Labs  Lab 07/02/22 1547  TSH 2.218    BNP Recent Labs  Lab 06/30/2022 1326 07/04/22 0848  BNP 42.4 71.9    DDimer No results for input(s): "DDIMER" in the last 168 hours.   Radiology    DG CHEST PORT 1 VIEW  Result Date: 07/03/2022 CLINICAL DATA:  [id number] Choking [id number] EXAM: PORTABLE CHEST 1 VIEW COMPARISON:  Chest x-ray 07/10/2022, chest x-ray 06/29/2022, CT chest 01/29/2022 FINDINGS: Left chest wall 2 lead pacemaker in grossly appropriate position. The heart and mediastinal contours are unchanged. Low lung volumes. Bibasilar airspace opacities. No pulmonary edema. Least trace right pleural effusion. Question trace left pleural effusion. No pneumothorax. No acute osseous abnormality. IMPRESSION: Bibasilar airspace opacities that may represent a combination of  atelectasis versus infection. At least trace right pleural effusion. Question trace left pleural effusion Electronically Signed   By: Iven Finn M.D.   On: 07/03/2022 22:01    Cardiac Studies   N/a   Patient Profile     81 y.o. male with a hx of CAD, HTN, HLD, OSA not on CPAP, sinus node dysfunction s/p PPM, atrial flutter, restless leg syndrome and chronic insomnia who was seen 07/02/2022 for the evaluation of possible syncope at the request of Dr. Lorin Mercy.   Assessment & Plan    LOC/Possible syncope/Daytime Somnolence: He is felt to have sleep apnea and chronic hypoxic/hypercapnic respiratory failure. I do not think he had syncope. I think he more likely is having episodes of somnolence. His cardiac issues are not felt to be driving his respiratory issues. We did give one dose of IV Lasix yesterday given mild crackles on exam. BNP was normal. He does not appear to be volume overloaded. He will need to have his home dose of Torsemide (10 mg) restarted prior to discharge.   Paroxysmal Atrial flutter: rate controlled atrial fib. Paced. Continue Eliquis and Cardizem.   Sinus node dysfunction: status post PPM  We will sign off. Please call with questions.   For questions or updates, please contact [place] Please consult [email or web address] for contact info under   Signed, [name], MD , [facility] 07/05/2022, 8:03 AM

## 2022-07-05 NOTE — Progress Notes (Signed)
NAME:  [name], MRN:  [id number], DOB:  1941/01/10, LOS: 2 ADMISSION DATE:  07/16/2022, CONSULTATION DATE:  07/04/2022 REFERRING MD:  Bonner Puna, CHIEF COMPLAINT:  AMS   History of Present Illness:  81 yo male former smoker with altered mental status.  Has chronic pain.  ABG showed hypercapnia.  He has been inconsistent with Bipap use.  He is limited resuscitation - no intubation.    Pertinent  Medical History  A flutter, AVM, CAD, HFpEF, Depression, GERD, Hiatal hernia, Diverticulosis, HLD, HTN, Hypothyroidism, OSA, DM  Significant Hospital Events: Including procedures, antibiotic start and stop dates in addition to other pertinent events   12/13 PCCM consulted  Interim History / Subjective:  He was able to wear Bipap intermittently since yesterday.  Family feels he is still very confused, and they are concerned he will not be consistent with therapy as an outpt.    Objective   Blood pressure 125/74, pulse 82, temperature 99.6 F (37.6 C), temperature source Axillary, resp. rate (!) 21, height '5\' 9"'$  (1.753 m), weight 95.3 kg, SpO2 100 %.    FiO2 (%):  [50 %] 50 %   Intake/Output Summary (Last 24 hours) at 07/05/2022 1126 Last data filed at 07/05/2022 3244 Gross per 24 hour  Intake 269.95 ml  Output 1900 ml  Net -1630.05 ml   Filed Weights   07/02/22 1300 07/04/22 0500 07/05/22 0557  Weight: 101.7 kg 99.9 kg 95.3 kg    Examination:  General - somnolent, WOB better Eyes - pupils reactive ENT - no sinus tenderness, no stridor Cardiac - regular rate/rhythm, no murmur Chest - scattered rhonchi Abdomen - soft, non tender, + bowel sounds Extremities - no cyanosis, clubbing, or edema Skin - no rashes Neuro - confused  Resolved Hospital Problem list     Assessment & Plan:   Acute on chronic hypoxic/hypercapnic respiratory failure. - from aspiration pneumonitis, CHF, and sleep disordered breathing with opiate use - no intubation - improved when he is able to use  Bipap, but ongoing compliance with therapy is a concern - Abx day 2 of 5, currently on zosyn - avoid sedating medications - supplemental oxygen to keep SpO2 > 01%  Acute metabolic encephalopathy from hypercapnia. Chronic pain. - avoid sedating medications as able  Acute on chronic HFpEF. PAF. Sinus node dysfunction s/p PPM. CAD. - cardiology signed off 07/05/22  Goals of care. - palliative care consulted  DM type 2 with peripheral neuropathy. HLD. HTN. Hypothyroidism. - per primary team  D/w Dr. Bonner Puna.  If the pt wishes to continue therapy and agrees to maintain compliance with Bipap, then please let PCCM know to arrange for appropriate follow up.  Otherwise PCCM will sign off.  Please call if additional assistance needed while he is in hospital.  Best Practice (right click and "Reselect all SmartList Selections" daily)   Diet/type: NPO DVT prophylaxis: DOAC GI prophylaxis: PPI Lines: N/A Foley:  N/A Code Status:  limited Last date of multidisciplinary goals of care discussion [updated pt's family at bedside]  Labs       Latest Ref Rng & Units 07/05/2022    1:53 AM 07/03/2022    2:22 PM 07/02/2022    4:35 AM  CMP  Glucose 70 - 99 mg/dL 145   112   BUN 8 - 23 mg/dL 19   15   Creatinine 0.61 - 1.24 mg/dL 1.29   0.88   Sodium 135 - 145 mmol/L 140   138   Potassium 3.5 -  5.1 mmol/L 4.3   4.1   Chloride 98 - 111 mmol/L 98   102   CO2 22 - 32 mmol/L 32   29   Calcium 8.9 - 10.3 mg/dL 9.0   8.9   Total Protein 6.5 - 8.1 g/dL  6.6    Total Bilirubin 0.3 - 1.2 mg/dL  1.2    Alkaline Phos 38 - 126 U/L  53    AST 15 - 41 U/L  21    ALT 0 - 44 U/L  16         Latest Ref Rng & Units 07/02/2022    4:35 AM 07/12/2022   12:56 PM 03/29/2022    8:26 PM  CBC  WBC 4.0 - 10.5 K/uL 11.3  10.5  8.6   Hemoglobin 13.0 - 17.0 g/dL 11.9  11.7  11.9   Hematocrit 39.0 - 52.0 % 40.1  39.6  37.8   Platelets 150 - 400 K/uL 311  289  313     ABG    Component Value Date/Time    PHART 7.31 (L) 07/03/2022 2141   PCO2ART 65 (H) 07/03/2022 2141   PO2ART 104 07/03/2022 2141   HCO3 32.5 (H) 07/03/2022 2141   TCO2 32 01/25/2022 0800   ACIDBASEDEF 3.0 (H) 09/03/2007 0600   O2SAT 98.5 07/03/2022 2141    Signature:  Chesley Mires, MD [place] Pager - [phone] 07/05/2022, 11:26 AM
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
## 2022-07-05 NOTE — Progress Notes (Signed)
Mobility Specialist - Progress Note   07/05/22 1503  Mobility  Activity Transferred from chair to bed  Level of Assistance +2 (takes two people)  Assistive Device None  Activity Response Tolerated fair  Mobility Referral No  $Mobility charge 1 Mobility   Pt received in chair requesting assistance to bed. Pt was +2 to stand and transfer to bed. Pt was left in bed with all needs met and RN present.   Franki Monte  Mobility Specialist Please contact via Solicitor or Rehab office at [phone]

## 2022-07-06 ENCOUNTER — Ambulatory Visit: Payer: Medicare HMO | Admitting: Family Medicine

## 2022-07-06 ENCOUNTER — Encounter: Payer: Self-pay | Admitting: Family Medicine

## 2022-07-13 ENCOUNTER — Ambulatory Visit: Payer: Medicare HMO | Admitting: Nurse Practitioner

## 2022-07-20 ENCOUNTER — Encounter: Payer: Medicare HMO | Admitting: Licensed Clinical Social Worker

## 2022-07-23 NOTE — Progress Notes (Signed)
   Jul 09, 2022 0700  Attending [place]  Attending Physician Notified Y  Attending Physician (First and Last Name) Fayrene Helper  Post Mortem Checklist  Date of Death 2022/07/09  Time of Death 0645  Pronounced By Barnetta Chapel, RN. Jamelle Haring, RN  Next of kin notified Yes  Name of next of kin notified of death [name]  Contact Person's Relationship to Patient Health care power of attorney  Contact Person's Phone Number [phone]  Was the patient a No Code Blue or a Limited Code Blue? Yes  Did the patient die unattended? No  Patient restrained? 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 N  Autopsy  Autopsy requested by MD or Family ( Non ME Case) N/A  Medical Examiner  Is this a medical examiner's case? Velma home name/address/phone #  (waiting for POA to call back with information.)   Patient found deceased around 63. Expected. Patient was comfort care. Girlfriend at bedside and will notify son who is his POA. She is not aware of any funeral arrangements. Attending, Florene [place] notified via secure page. POA, John, is coming from [place] and will take about 4 hours to get here.
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
## 2022-07-23 DEATH — deceased

## 2022-07-25 ENCOUNTER — Other Ambulatory Visit: Payer: Self-pay | Admitting: Family Medicine

## 2022-07-25 DIAGNOSIS — E1140 Type 2 diabetes mellitus with diabetic neuropathy, unspecified: Secondary | ICD-10-CM

## 2022-08-06 ENCOUNTER — Other Ambulatory Visit: Payer: Self-pay | Admitting: Cardiology

## 2024-07-15 ENCOUNTER — Other Ambulatory Visit (HOSPITAL_COMMUNITY): Payer: Self-pay
